# Patient Record
Sex: Female | Born: 1940
Health system: Southern US, Academic
[De-identification: ages and names within clinical notes are randomized; demographics above are authoritative.]

## PROBLEM LIST (undated history)

## (undated) ENCOUNTER — Ambulatory Visit

## (undated) ENCOUNTER — Encounter

## (undated) ENCOUNTER — Telehealth

## (undated) ENCOUNTER — Encounter: Attending: Internal Medicine | Primary: Internal Medicine

## (undated) ENCOUNTER — Telehealth: Attending: Internal Medicine | Primary: Internal Medicine

## (undated) ENCOUNTER — Encounter: Payer: MEDICARE | Attending: Internal Medicine | Primary: Internal Medicine

## (undated) ENCOUNTER — Non-Acute Institutional Stay: Payer: MEDICARE

## (undated) ENCOUNTER — Telehealth: Attending: Hematology & Oncology | Primary: Hematology & Oncology

## (undated) ENCOUNTER — Ambulatory Visit: Payer: MEDICARE | Attending: General Practice | Primary: General Practice

## (undated) ENCOUNTER — Ambulatory Visit: Attending: Urology | Primary: Urology

## (undated) ENCOUNTER — Inpatient Hospital Stay

## (undated) ENCOUNTER — Encounter: Attending: Oncology | Primary: Oncology

## (undated) DIAGNOSIS — R7989 Other specified abnormal findings of blood chemistry: Secondary | ICD-10-CM

## (undated) DIAGNOSIS — N3090 Cystitis, unspecified without hematuria: Secondary | ICD-10-CM

## (undated) DIAGNOSIS — M1990 Unspecified osteoarthritis, unspecified site: Secondary | ICD-10-CM

## (undated) DIAGNOSIS — G4730 Sleep apnea, unspecified: Secondary | ICD-10-CM

## (undated) DIAGNOSIS — I10 Essential (primary) hypertension: Secondary | ICD-10-CM

## (undated) DIAGNOSIS — J189 Pneumonia, unspecified organism: Secondary | ICD-10-CM

## (undated) DIAGNOSIS — F329 Major depressive disorder, single episode, unspecified: Secondary | ICD-10-CM

## (undated) DIAGNOSIS — F32A Depression, unspecified: Secondary | ICD-10-CM

## (undated) DIAGNOSIS — K1379 Other lesions of oral mucosa: Secondary | ICD-10-CM

## (undated) DIAGNOSIS — R222 Localized swelling, mass and lump, trunk: Secondary | ICD-10-CM

## (undated) DIAGNOSIS — R350 Frequency of micturition: Secondary | ICD-10-CM

## (undated) DIAGNOSIS — R0600 Dyspnea, unspecified: Secondary | ICD-10-CM

## (undated) DIAGNOSIS — R0609 Other forms of dyspnea: Secondary | ICD-10-CM

## (undated) DIAGNOSIS — R011 Cardiac murmur, unspecified: Secondary | ICD-10-CM

## (undated) DIAGNOSIS — E785 Hyperlipidemia, unspecified: Secondary | ICD-10-CM

## (undated) DIAGNOSIS — K219 Gastro-esophageal reflux disease without esophagitis: Secondary | ICD-10-CM

## (undated) DIAGNOSIS — N200 Calculus of kidney: Secondary | ICD-10-CM

## (undated) DIAGNOSIS — H9190 Unspecified hearing loss, unspecified ear: Secondary | ICD-10-CM

## (undated) DIAGNOSIS — D46Z Other myelodysplastic syndromes: Principal | ICD-10-CM

## (undated) DIAGNOSIS — R3915 Urgency of urination: Secondary | ICD-10-CM

## (undated) DIAGNOSIS — F419 Anxiety disorder, unspecified: Secondary | ICD-10-CM

## (undated) DIAGNOSIS — E119 Type 2 diabetes mellitus without complications: Secondary | ICD-10-CM

## (undated) DIAGNOSIS — E669 Obesity, unspecified: Secondary | ICD-10-CM

## (undated) DIAGNOSIS — R3129 Other microscopic hematuria: Secondary | ICD-10-CM

## (undated) DIAGNOSIS — I209 Angina pectoris, unspecified: Secondary | ICD-10-CM

## (undated) DIAGNOSIS — K1231 Oral mucositis (ulcerative) due to antineoplastic therapy: Secondary | ICD-10-CM

## (undated) DIAGNOSIS — N6019 Diffuse cystic mastopathy of unspecified breast: Secondary | ICD-10-CM

## (undated) DIAGNOSIS — Z9181 History of falling: Secondary | ICD-10-CM

## (undated) DIAGNOSIS — D696 Thrombocytopenia, unspecified: Secondary | ICD-10-CM

## (undated) HISTORY — DX: Calculus of kidney: N20.0

## (undated) HISTORY — DX: Dyspnea, unspecified: R06.00

## (undated) HISTORY — DX: Other myelodysplastic syndromes: D46.Z

## (undated) HISTORY — DX: Essential (primary) hypertension: I10

## (undated) HISTORY — DX: Obesity, unspecified: E66.9

## (undated) HISTORY — DX: Other lesions of oral mucosa: K13.79

## (undated) HISTORY — DX: Other specified abnormal findings of blood chemistry: R79.89

## (undated) HISTORY — DX: Frequency of micturition: R35.0

## (undated) HISTORY — DX: Morbid (severe) obesity due to excess calories: E66.01

## (undated) HISTORY — DX: Urgency of urination: R39.15

## (undated) HISTORY — DX: Anxiety disorder, unspecified: F41.9

## (undated) HISTORY — DX: Thrombocytopenia, unspecified: D69.6

## (undated) HISTORY — PX: APPENDECTOMY: SHX54

## (undated) HISTORY — PX: CHOLECYSTECTOMY: SHX55

## (undated) HISTORY — PX: DIAGNOSTIC LAPAROSCOPY: SUR761

## (undated) HISTORY — PX: OTHER SURGICAL HISTORY: SHX169

## (undated) HISTORY — DX: History of falling: Z91.81

## (undated) HISTORY — DX: Other forms of dyspnea: R06.09

## (undated) HISTORY — DX: Localized swelling, mass and lump, trunk: R22.2

## (undated) HISTORY — DX: Cardiac murmur, unspecified: R01.1

## (undated) HISTORY — DX: Diffuse cystic mastopathy of unspecified breast: N60.19

## (undated) HISTORY — DX: Unspecified hearing loss, unspecified ear: H91.90

## (undated) HISTORY — DX: Oral mucositis (ulcerative) due to antineoplastic therapy: K12.31

## (undated) HISTORY — DX: Other microscopic hematuria: R31.29

## (undated) HISTORY — DX: Cystitis, unspecified without hematuria: N30.90

## (undated) HISTORY — PX: CARDIAC CATHETERIZATION: SHX172

## (undated) HISTORY — PX: ABDOMINAL HYSTERECTOMY: SHX81

---
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
## 1998-06-13 ENCOUNTER — Encounter: Payer: Self-pay | Admitting: *Deleted

## 1998-06-13 ENCOUNTER — Ambulatory Visit (HOSPITAL_COMMUNITY): Admission: RE | Admit: 1998-06-13 | Discharge: 1998-06-13 | Payer: Self-pay | Admitting: *Deleted

## 2003-11-13 ENCOUNTER — Other Ambulatory Visit: Payer: Self-pay

## 2004-07-10 ENCOUNTER — Ambulatory Visit: Payer: Self-pay | Admitting: Surgery

## 2004-07-25 ENCOUNTER — Other Ambulatory Visit: Payer: Self-pay

## 2004-07-30 ENCOUNTER — Ambulatory Visit: Payer: Self-pay | Admitting: Surgery

## 2004-10-03 ENCOUNTER — Inpatient Hospital Stay: Payer: Self-pay | Admitting: Internal Medicine

## 2005-02-03 ENCOUNTER — Other Ambulatory Visit: Payer: Self-pay

## 2005-02-04 ENCOUNTER — Inpatient Hospital Stay: Payer: Self-pay | Admitting: Surgery

## 2005-02-15 ENCOUNTER — Ambulatory Visit: Payer: Self-pay | Admitting: Surgery

## 2005-02-20 ENCOUNTER — Ambulatory Visit: Payer: Self-pay | Admitting: Surgery

## 2005-04-23 ENCOUNTER — Emergency Department: Payer: Self-pay | Admitting: Emergency Medicine

## 2005-07-07 ENCOUNTER — Emergency Department: Payer: Self-pay | Admitting: Emergency Medicine

## 2005-07-07 ENCOUNTER — Other Ambulatory Visit: Payer: Self-pay

## 2005-12-07 ENCOUNTER — Observation Stay: Payer: Self-pay | Admitting: Internal Medicine

## 2005-12-12 ENCOUNTER — Other Ambulatory Visit: Payer: Self-pay

## 2005-12-16 ENCOUNTER — Ambulatory Visit: Payer: Self-pay | Admitting: Urology

## 2006-03-31 ENCOUNTER — Ambulatory Visit: Payer: Self-pay | Admitting: Gastroenterology

## 2006-05-16 ENCOUNTER — Other Ambulatory Visit: Payer: Self-pay

## 2006-05-16 ENCOUNTER — Ambulatory Visit: Payer: Self-pay | Admitting: Internal Medicine

## 2007-04-16 ENCOUNTER — Emergency Department: Payer: Self-pay | Admitting: Unknown Physician Specialty

## 2012-09-07 ENCOUNTER — Ambulatory Visit: Payer: Self-pay | Admitting: Internal Medicine

## 2012-09-14 ENCOUNTER — Ambulatory Visit: Payer: Self-pay | Admitting: Internal Medicine

## 2012-09-14 LAB — CBC WITH DIFFERENTIAL/PLATELET
Basophil #: 0 10*3/uL (ref 0.0–0.1)
Basophil %: 0.4 %
Eosinophil #: 0.2 10*3/uL (ref 0.0–0.7)
Eosinophil %: 2 %
HCT: 39.8 % (ref 35.0–47.0)
HGB: 13.6 g/dL (ref 12.0–16.0)
Lymphocyte #: 1.5 10*3/uL (ref 1.0–3.6)
Lymphocyte %: 18.2 %
MCH: 30.1 pg (ref 26.0–34.0)
MCHC: 34 g/dL (ref 32.0–36.0)
MCV: 89 fL (ref 80–100)
Monocyte #: 0.7 x10 3/mm (ref 0.2–0.9)
Monocyte %: 8.9 %
Neutrophil #: 5.6 10*3/uL (ref 1.4–6.5)
Neutrophil %: 70.5 %
Platelet: 212 10*3/uL (ref 150–440)
RBC: 4.5 10*6/uL (ref 3.80–5.20)
RDW: 13.9 % (ref 11.5–14.5)
WBC: 8 10*3/uL (ref 3.6–11.0)

## 2012-09-14 LAB — BASIC METABOLIC PANEL
Anion Gap: 6 — ABNORMAL LOW (ref 7–16)
BUN: 15 mg/dL (ref 7–18)
Calcium, Total: 9.1 mg/dL (ref 8.5–10.1)
Chloride: 104 mmol/L (ref 98–107)
Co2: 31 mmol/L (ref 21–32)
Creatinine: 0.89 mg/dL (ref 0.60–1.30)
EGFR (African American): >60
EGFR (Non-African Amer.): >60
Glucose: 103 mg/dL — ABNORMAL HIGH (ref 65–99)
Osmolality: 282 (ref 275–301)
Potassium: 3.6 mmol/L (ref 3.5–5.1)
Sodium: 141 mmol/L (ref 136–145)

## 2012-10-30 ENCOUNTER — Ambulatory Visit: Payer: Self-pay | Admitting: Family Medicine

## 2012-10-30 LAB — BASIC METABOLIC PANEL
Anion Gap: 4 — ABNORMAL LOW (ref 7–16)
BUN: 21 mg/dL — ABNORMAL HIGH (ref 7–18)
Calcium, Total: 9.3 mg/dL (ref 8.5–10.1)
Chloride: 107 mmol/L (ref 98–107)
Co2: 31 mmol/L (ref 21–32)
Creatinine: 0.8 mg/dL (ref 0.60–1.30)
EGFR (African American): >60
EGFR (Non-African Amer.): >60
Glucose: 105 mg/dL — ABNORMAL HIGH (ref 65–99)
Osmolality: 286 (ref 275–301)
Potassium: 3.6 mmol/L (ref 3.5–5.1)
Sodium: 142 mmol/L (ref 136–145)

## 2012-11-09 ENCOUNTER — Inpatient Hospital Stay: Payer: Self-pay | Admitting: Internal Medicine

## 2012-11-09 LAB — CK TOTAL AND CKMB (NOT AT ARMC)
CK, Total: 74 U/L (ref 21–215)
CK, Total: 63 U/L (ref 21–215)
CK-MB: 1.4 ng/mL (ref 0.5–3.6)
CK-MB: 1.7 ng/mL (ref 0.5–3.6)

## 2012-11-09 LAB — CBC WITH DIFFERENTIAL/PLATELET
Basophil #: 0 10*3/uL (ref 0.0–0.1)
Basophil %: 0.6 %
Eosinophil #: 0.2 10*3/uL (ref 0.0–0.7)
Eosinophil %: 1.8 %
HCT: 38.6 % (ref 35.0–47.0)
HGB: 13.2 g/dL (ref 12.0–16.0)
Lymphocyte #: 1.2 10*3/uL (ref 1.0–3.6)
Lymphocyte %: 13.5 %
MCH: 30 pg (ref 26.0–34.0)
MCHC: 34.2 g/dL (ref 32.0–36.0)
MCV: 88 fL (ref 80–100)
Monocyte #: 0.6 x10 3/mm (ref 0.2–0.9)
Monocyte %: 7.4 %
Neutrophil #: 6.6 10*3/uL — ABNORMAL HIGH (ref 1.4–6.5)
Neutrophil %: 76.7 %
Platelet: 160 10*3/uL (ref 150–440)
RBC: 4.4 10*6/uL (ref 3.80–5.20)
RDW: 13.7 % (ref 11.5–14.5)
WBC: 8.6 10*3/uL (ref 3.6–11.0)

## 2012-11-09 LAB — BASIC METABOLIC PANEL
Anion Gap: 6 — ABNORMAL LOW (ref 7–16)
BUN: 18 mg/dL (ref 7–18)
Calcium, Total: 8.5 mg/dL (ref 8.5–10.1)
Chloride: 109 mmol/L — ABNORMAL HIGH (ref 98–107)
Co2: 27 mmol/L (ref 21–32)
Creatinine: 0.94 mg/dL (ref 0.60–1.30)
EGFR (African American): >60
EGFR (Non-African Amer.): >60
Glucose: 100 mg/dL — ABNORMAL HIGH (ref 65–99)
Osmolality: 285 (ref 275–301)
Potassium: 3.6 mmol/L (ref 3.5–5.1)
Sodium: 142 mmol/L (ref 136–145)

## 2012-11-09 LAB — APTT: Activated PTT: 40.4 secs — ABNORMAL HIGH (ref 23.6–35.9)

## 2012-11-09 LAB — TROPONIN I
Troponin-I: 0.04 ng/mL
Troponin-I: 0.1 ng/mL — ABNORMAL HIGH

## 2012-11-10 LAB — CK TOTAL AND CKMB (NOT AT ARMC)
CK, Total: 60 U/L (ref 21–215)
CK, Total: 68 U/L (ref 21–215)

## 2012-11-10 LAB — CBC WITH DIFFERENTIAL/PLATELET
Basophil %: 0.7 %
Eosinophil #: 0.1 10*3/uL (ref 0.0–0.7)
HCT: 35.3 % (ref 35.0–47.0)
Lymphocyte %: 22.6 %
MCH: 30 pg (ref 26.0–34.0)
MCHC: 34.1 g/dL (ref 32.0–36.0)
MCV: 88 fL (ref 80–100)
Monocyte #: 0.7 x10 3/mm (ref 0.2–0.9)
Neutrophil #: 5.3 10*3/uL (ref 1.4–6.5)
Neutrophil %: 66.3 %
Platelet: 154 10*3/uL (ref 150–440)
RBC: 4.01 10*6/uL (ref 3.80–5.20)

## 2012-11-10 LAB — APTT
Activated PTT: >160 secs (ref 23.6–35.9)
Activated PTT: 158.1 secs — ABNORMAL HIGH (ref 23.6–35.9)

## 2012-11-10 LAB — BASIC METABOLIC PANEL
BUN: 14 mg/dL (ref 7–18)
Calcium, Total: 8.2 mg/dL — ABNORMAL LOW (ref 8.5–10.1)
Co2: 29 mmol/L (ref 21–32)
Creatinine: 0.79 mg/dL (ref 0.60–1.30)
EGFR (African American): >60
Glucose: 103 mg/dL — ABNORMAL HIGH (ref 65–99)
Osmolality: 286 (ref 275–301)
Potassium: 3.1 mmol/L — ABNORMAL LOW (ref 3.5–5.1)

## 2012-11-10 LAB — MAGNESIUM: Magnesium: 1.8 mg/dL

## 2012-11-10 LAB — LIPID PANEL: Cholesterol: 167 mg/dL (ref 0–200)

## 2012-11-10 LAB — TROPONIN I: Troponin-I: 0.03 ng/mL

## 2012-11-10 LAB — PROTIME-INR: Prothrombin Time: 13.8 secs (ref 11.5–14.7)

## 2013-05-04 ENCOUNTER — Ambulatory Visit: Payer: Self-pay | Admitting: Family Medicine

## 2014-05-02 DIAGNOSIS — J449 Chronic obstructive pulmonary disease, unspecified: Secondary | ICD-10-CM | POA: Insufficient documentation

## 2014-05-02 DIAGNOSIS — R069 Unspecified abnormalities of breathing: Secondary | ICD-10-CM | POA: Insufficient documentation

## 2014-05-02 DIAGNOSIS — R0681 Apnea, not elsewhere classified: Secondary | ICD-10-CM | POA: Insufficient documentation

## 2014-05-11 ENCOUNTER — Ambulatory Visit: Payer: Self-pay | Admitting: Family Medicine

## 2014-05-31 ENCOUNTER — Ambulatory Visit: Payer: Self-pay | Admitting: Specialist

## 2014-09-20 DIAGNOSIS — J449 Chronic obstructive pulmonary disease, unspecified: Secondary | ICD-10-CM | POA: Diagnosis not present

## 2014-09-20 DIAGNOSIS — R590 Localized enlarged lymph nodes: Secondary | ICD-10-CM | POA: Diagnosis not present

## 2014-09-20 DIAGNOSIS — R0602 Shortness of breath: Secondary | ICD-10-CM | POA: Diagnosis not present

## 2014-09-20 DIAGNOSIS — G4733 Obstructive sleep apnea (adult) (pediatric): Secondary | ICD-10-CM | POA: Diagnosis not present

## 2014-09-20 DIAGNOSIS — R1011 Right upper quadrant pain: Secondary | ICD-10-CM | POA: Diagnosis not present

## 2014-09-28 ENCOUNTER — Ambulatory Visit: Payer: Self-pay | Admitting: Family Medicine

## 2014-09-28 DIAGNOSIS — K760 Fatty (change of) liver, not elsewhere classified: Secondary | ICD-10-CM | POA: Diagnosis not present

## 2014-09-28 DIAGNOSIS — R109 Unspecified abdominal pain: Secondary | ICD-10-CM | POA: Diagnosis not present

## 2014-09-28 DIAGNOSIS — R1011 Right upper quadrant pain: Secondary | ICD-10-CM | POA: Diagnosis not present

## 2014-09-28 DIAGNOSIS — N289 Disorder of kidney and ureter, unspecified: Secondary | ICD-10-CM | POA: Diagnosis not present

## 2014-10-07 DIAGNOSIS — I10 Essential (primary) hypertension: Secondary | ICD-10-CM | POA: Diagnosis not present

## 2014-10-07 DIAGNOSIS — E785 Hyperlipidemia, unspecified: Secondary | ICD-10-CM | POA: Diagnosis not present

## 2014-10-07 DIAGNOSIS — E669 Obesity, unspecified: Secondary | ICD-10-CM | POA: Diagnosis not present

## 2014-10-07 DIAGNOSIS — R748 Abnormal levels of other serum enzymes: Secondary | ICD-10-CM | POA: Diagnosis not present

## 2014-10-07 DIAGNOSIS — E119 Type 2 diabetes mellitus without complications: Secondary | ICD-10-CM | POA: Diagnosis not present

## 2014-10-13 DIAGNOSIS — R748 Abnormal levels of other serum enzymes: Secondary | ICD-10-CM | POA: Diagnosis not present

## 2014-10-13 DIAGNOSIS — R350 Frequency of micturition: Secondary | ICD-10-CM | POA: Diagnosis not present

## 2014-10-13 DIAGNOSIS — R1011 Right upper quadrant pain: Secondary | ICD-10-CM | POA: Diagnosis not present

## 2014-10-17 DIAGNOSIS — R312 Other microscopic hematuria: Secondary | ICD-10-CM | POA: Diagnosis not present

## 2014-10-17 DIAGNOSIS — N390 Urinary tract infection, site not specified: Secondary | ICD-10-CM | POA: Diagnosis not present

## 2014-10-17 DIAGNOSIS — I10 Essential (primary) hypertension: Secondary | ICD-10-CM | POA: Diagnosis not present

## 2014-11-02 ENCOUNTER — Ambulatory Visit: Payer: Self-pay

## 2014-11-02 DIAGNOSIS — K5900 Constipation, unspecified: Secondary | ICD-10-CM | POA: Diagnosis not present

## 2014-11-02 DIAGNOSIS — N281 Cyst of kidney, acquired: Secondary | ICD-10-CM | POA: Diagnosis not present

## 2014-11-02 DIAGNOSIS — M5136 Other intervertebral disc degeneration, lumbar region: Secondary | ICD-10-CM | POA: Diagnosis not present

## 2014-11-02 DIAGNOSIS — L723 Sebaceous cyst: Secondary | ICD-10-CM | POA: Diagnosis not present

## 2014-11-02 DIAGNOSIS — N200 Calculus of kidney: Secondary | ICD-10-CM | POA: Diagnosis not present

## 2014-11-07 DIAGNOSIS — R312 Other microscopic hematuria: Secondary | ICD-10-CM | POA: Diagnosis not present

## 2014-11-17 DIAGNOSIS — K5900 Constipation, unspecified: Secondary | ICD-10-CM | POA: Diagnosis not present

## 2014-11-24 DIAGNOSIS — K5900 Constipation, unspecified: Secondary | ICD-10-CM | POA: Diagnosis not present

## 2014-12-28 DIAGNOSIS — R05 Cough: Secondary | ICD-10-CM | POA: Diagnosis not present

## 2014-12-28 DIAGNOSIS — K5900 Constipation, unspecified: Secondary | ICD-10-CM | POA: Diagnosis not present

## 2014-12-30 NOTE — Discharge Summary (Signed)
PATIENT NAME:  Teresa Carrillo, Teresa Carrillo MR#:  [id number] DATE OF BIRTH:  1941-02-27  DATE OF ADMISSION:  11/09/2012 DATE OF DISCHARGE:  11/10/2012  REFERRING PHYSICIAN: Keith Rake, MD   CONSULTATION:  Derrick Ravel, MD  DISCHARGE DIAGNOSES: 1.  Chest pain,  atypical without acute coronary syndrome.    2.  Hypertension.  3.  Hyperlipidemia.  4.  Obstructive sleep apnea.  5.  Obesity.   PROCEDURES: Stress test is normal.   CONDITION: Stable.   CODE STATUS: Full code.   HOME MEDICATIONS: 1.  Bupropion 300 mg p.o. daily.  2.  Zocor 20 mg p.o. at bedtime.  3.  Bisoprolol/HCTZ 5 mg/ 6.25 mg p.o. 1 tablet p.o. daily.  4.  Sertraline 100 mg p.o. tablets once a day.  5.  Klonopin 0.5 mg p.o. tablets b.i.d.  6.  Aspirin.   NEW MEDICATIONS:  1.  Aspirin 81 mg p.o. daily. 2.  Nitroglycerin 0.4 mg sublingual tablets  sublingual every five minutes up to 3 times p.r.n. for chest pain.  3.  Lisinopril 5 mg p.o. daily.   DIET: Low sodium, low fat, low cholesterol diet.   ACTIVITY: As tolerated.   FOLLOW-UP CARE: PCP within 1 to 2 weeks. Follow up with Dr. Clayborn Bigness within 1 week.   REASON FOR ADMISSION: Chest pain.   HOSPITAL COURSE: The patient is a 74 year old Caucasian female with a history of hypertension, hyperlipidemia, obesity, OSA, presented to the ED with chest pain, which was a pressure-like on the left side radiating to the left shoulder and neck, associated with racing of the heart and shortness of breath.  For detailed history and physical examination, please refer to the admission note dictated by Dr. Margaretmary Eddy. On admission date, the patient's troponin level was 0.04 and then increased to 0.10. CK 63, CK-MB 1.7, hemoglobin 13.2, D-dimer was elevated at 0.89 and so the patient got a CAT scan of the chest with contrast, which showed no evidence of PE, but the patient was admitted to CCU for  non-STEMI. After admission, the patient has been treated with heparin drip, aspirin, statin and  Lopressor. Troponin level decreased to 0.08 this morning and down to 0.03 this afternoon. Dr. Nehemiah Massed evaluated the patient and suggested stress test, which was normal. According to Dr. Nehemiah Massed, the patient has no ACS and may be discharged to home today The patient is clinically stable and will be discharged to home today. I discussed the patient's discharge plan with the patient and the case manager.   TIME SPENT: About 38 minutes     ____________________________ Demetrios Loll, MD qc:cc D: 11/10/2012 16:44:32 ET T: 11/10/2012 19:05:59 ET JOB#: [id number]  cc: Demetrios Loll, MD, <Dictator> Demetrios Loll MD ELECTRONICALLY SIGNED 11/11/2012 14:45

## 2014-12-30 NOTE — H&P (Signed)
PATIENT NAME:  Teresa Carrillo, Teresa Carrillo MR#:  [id number] DATE OF BIRTH:  Apr 03, 1941  DATE OF ADMISSION:  11/09/2012  PRIMARY CARE PHYSICIAN:  Dr. Keith Rake.   CARDIOLOGY:  Dr. Clayborn Bigness.   CHIEF COMPLAINT:  Chest pressure.   HISTORY OF PRESENT ILLNESS: The patient is a 74 year old obese female with a past medical history of hypertension, hyperlipidemia, and obstructive sleep apnea presenting to the ER with a chief complaint of chest pressure since this afternoon. The patient was in her usual state of health until this afternoon. Today the patient suddenly started having left-sided chest pressure radiating to the left shoulder and neck, associated with racing of her heart and shortness of breath.   Denies any nausea or vomiting. As the chest pressure was not going away, the patient called  911 and came into the ER immediately. A 12-lead EKG did not reveal any ST-T changes. The second troponin was positive. The initial one was at 0.04. The other troponin was 0.10.   The patient has received aspirin and sublingual nitroglycerin. A heparin bolus was given and a heparin drip was also ordered. During my examination the patient is reporting that after giving a heparin bolus, her chest pressure is significantly improved. She also reported that sublingual nitroglycerin helped with her chest pressure. The patient is again complaining of chest pressure. Nitro paste was ordered stat. A call was placed to Dr. Clayborn Bigness, her cardiologist, and I am awaiting a call-back from Dr. Clayborn Bigness.   The patient is reporting that she was recently seen by Dr. Clayborn Bigness for chest pain, and she had an abnormal stress test. Eventually she had a clean cardiac cath on January 6th. The patient is concerned that now she is having chest pressure and a heart attack.   Denies any heart attacks in the past. No dizziness or loss of consciousness. Denies any nausea or vomiting. The patient's heart fluttering sensation was resolved during my  examination.    PAST MEDICAL HISTORY:  Hypertension, hyperlipidemia, obstructive sleep apnea, obesity.   PSYCHIATRIC HISTORY:  Panic attacks and depression.   PAST SURGICAL HISTORY:  Appendectomy, hysterectomy, cholecystectomy, cardiac catheter on January 6th, which was clean.   ALLERGIES:  She has no known drug allergies.   HOME MEDICATIONS:  Simvastatin 20 mg once daily, sertraline 100 mg once daily, Klonopin 0.5 mg 2 times a day, bupropion 300 mg q. 24 hours, bisoprolol dose unknown 1 tablet once a day.   PSYCHOSOCIAL HISTORY:  Lives at home with husband. She smoked several years ago but quit smoking a long ago. Denies any alcohol or illicit drug usage.   FAMILY HISTORY:  Mother had history of diabetes mellitus and coronary artery disease. The patient does not know her dad's medical history.   REVIEW OF SYSTEMS:  CONSTITUTIONAL:  Denies any fever, fatigue. Complaining of chest pressure, but no weight loss or weight gain.  EYES:  Denies any blurry vision, glaucoma, cataracts.  EARS, NOSE, THROAT:  No epistaxis, discharge, postnasal drip.  RESPIRATIONS:  Denies cough. Complaining of shortness of breath but no syncope. Palpitations when she came in but that is no longer.  GASTROINTESTINAL:  No nausea, vomiting, diarrhea, or abdominal pain.  GENITOURINARY:  No dysuria, hematuria.  GYNECOLOGIC/BREASTS:  No breast masses or vaginal discharge.  ENDOCRINE:  No polyuria, nocturia, thyroid problems.  HEMATOLOGIC AND LYMPHATIC:  No anemia, easy bruising.  INTEGUMENTARY:  No acne, rash, lesions.  MUSCULOSKELETAL:  Denies any back pain but complaining of left shoulder pain and neck pain in  the left side.  Denies any gout.  NEUROLOGIC:  No vertigo, ataxia, dementia.  PSYCHIATRIC:  Has history of panic attacks and depression. Denies any ADD, OCD.   PHYSICAL EXAMINATION: VITAL SIGNS:  Temperature 98.1, pulse 61, respirations 20, blood pressure 123/59, satting  97% on 2 liters.  GENERAL  APPEARANCE:  The patient is obese, not in acute distress, moderately-built and well-nourished.  HEENT:  Normocephalic, atraumatic. Pupils are equally reacting to light and accommodation. No scleral icterus. Extraocular movements are intact. Moist mucous membranes. No sinus tenderness. Tympanic membranes are intact.  NECK:  Supple. No JVD. No thyromegaly. Range of motion is intact.  LUNGS:  Clear to auscultation bilaterally. No anterior chest wall tenderness on palpation. No accessory muscle usage.  CARDIOVASCULAR:  S1, S2 normal. Regular rate and rhythm. No murmurs. No peripheral edema.  GASTROINTESTINAL:  Soft, obese. Bowel sounds are positive in all 4 quadrants. Nontender, nondistended. No masses felt. No hepatosplenomegaly.  NEUROLOGIC:  Awake, alert, and oriented x 3. Motor and sensory are grossly intact. Reflexes are 2+.  EXTREMITIES:  No edema. No cyanosis. No clubbing.  SKIN:  No rashes. No lesions, rash, acne.  BACK:  No CVA tenderness.  PSYCHIATRIC:  Normal mood and affect.  MUSCULOSKELETAL: No joint effusion, tenderness, or erythema, and range of motion is grossly intact.   Twelve-lead EKG has revealed normal sinus rhythm with fusion complexes and premature atrial complexes. No ST elevations noted.   CARDIAC ENZYMES:  First set:  CK 74, troponin T 0.04, CPK-MB 1.4. A repeat CK is 63, troponin is 0.10, CPK-MB 1.7.   WBC 8.6, hemoglobin 13.2, hematocrit is 38.6, platelet count 160, PTT 40.4. D-dimer is elevated at 0.89.   Glucose 100, BUN 18, creatinine 0.94, sodium 142, potassium 3.6, chloride of 109, CO2 of 27, anion gap 6, calcium 8.5. Serum osmolality 285.   CT of the chest has revealed no CT evidence of pulmonary arterial embolic disease, interstitial infiltrate. Different consideration of pulmonary edema versus an infectious or inflammatory infiltrate, likely reactive, mediastinal lymph nodes;   ASSESSMENT AND PLAN: A 74 year old female coming in with chest pressure since this  afternoon, and seen by  Dr. Clayborn Bigness recently and had a clean catheterization on January 6th of this year, now with elevated troponin. Will be admitted with the following assessment and plan:   1.  Non-ST elevation myocardial infarction: We will put her on acute coronary syndrome protocol with oxygen, nitroglycerin, aspirin, beta-blocker, statin, and heparin drip. Consult was placed to Dr. Clayborn Bigness. Awaiting call-back. Call placed 2 times. Will cycle cardiac biomarkers.  2.  The patient will be admitted to the critical care unit and monitored continuously.  3.  Hypertension:  Stable. Will provide beta blocker with metoprolol.  4.  Hyperlipidemia:  Check fasting lipid panel in a.m., and the patient will be given simvastatin.  5.  Obstructive sleep apnea:  Stable. We will provide her CPAP at bedtime.  6.  CT of chest has revealed infectious or inflammatory infiltrate. The patient is asymptomatic. White count is normal. Denies any cough. I will hold off on antibiotics at this time, and rounding physicians to continue close monitoring of the patient.  7.  Will provide her GI prophylaxis with Protonix.  8.  Deep vein thrombosis prophylaxis. The patient is on a heparin drip.  9.  CODE STATUS:  FULL CODE.   The diagnosis and plan of care was discussed in detail with the patient. She is aware of the plan.   Total critical  care time spent: 60 minutes.    ____________________________ Nicholes Mango, MD ag:dm D: 11/10/2012 00:36:00 ET T: 11/10/2012 07:40:49 ET JOB#: [id number]  cc: Nicholes Mango, MD, <Dictator> [facility] Myrla Halsted, MD Dwayne D. Clayborn Bigness, MD      Nicholes Mango MD ELECTRONICALLY SIGNED 11/13/2012 1:55

## 2014-12-30 NOTE — Consult Note (Signed)
PATIENT NAME:  Teresa Carrillo, AMARAL MR#:  [id number] DATE OF BIRTH:  12/14/1940  DATE OF CONSULTATION:  11/10/2012  PRIMARY CARE PHYSICIAN: Dr. Clayborn Bigness  CONSULTING PHYSICIAN:  Dr. Margaretmary Eddy  REASON FOR CONSULTATION: Chest pain with hypertension and elevated troponin.   CHIEF COMPLAINT: "I have chest pain and palpitations."   HISTORY OF PRESENT ILLNESS: This is a 74 year old female with known cardiac catheterization last year for chest pain with normal coronary arteries and normal LV systolic function, who had a stress test at that time as well as showing no rhythm disturbances. The patient had hypertension for which she has been on appropriate medication management and has done fairly well. Recently, she has had some palpitations, weakness, fatigue and chest discomfort, substernal, radiating into her back and left arm, without physical activity, associated with some shortness of breath. The patient was seen in the Emergency Room with an EKG showing normal sinus rhythm, normal EKG. Troponin was 0.1, and has been otherwise normal. The patient has had no evidence of worsening chest pain other than a dull ache into the center of her chest. CT scan also showed no evidence of significant changes.   REVIEW OF SYSTEMS:  The patient's review of systems is negative for vision change, ringing in the ears, hearing loss, cough, congestion, heartburn, nausea, vomiting, diarrhea, bloody stools, stomach pain, extremity pain, leg weakness, cramping of the buttocks. No blood clots, headaches, blackouts, dizzy spells, nosebleeds, congestion, trouble swallowing, frequent urination, urination at night, muscle weakness, numbness, anxiety, depression, skin lesions or skin rashes.   PAST MEDICAL HISTORY: Hypertension.   FAMILY HISTORY: Mother had hypertension, coronary artery disease; this was at an early age.   SOCIAL HISTORY: Currently denies alcohol or tobacco use.   ALLERGIES AND MEDICATIONS:  As listed.  PHYSICAL  EXAMINATION: VITAL SIGNS: Blood pressure is 110/68 bilaterally, heart rate 72 upright, reclining and regular.  GENERAL: She is a well-appearing female in no acute distress.  HEENT: No icterus, thyromegaly, ulcers, hemorrhage or xanthelasma.  CARDIOVASCULAR: Regular rate and rhythm. Normal S1 and S2, without murmur, gallop or rub. PMI is normal size and placement. Carotid upstroke is normal without bruit. Jugular venous pressure is normal.  LUNGS: Have a few basilar crackles with normal respirations.  ABDOMEN: Soft, nontender, without hepatosplenomegaly or masses. Abdominal aorta is normal size, without bruit. EXTREMITIES: Show 2+ bilateral pulses in dorsal, pedal, radial and femoral arteries, without lower extremity edema, cyanosis, clubbing or ulcers.  NEUROLOGIC: Oriented to time, place and person, with normal mood and affect.   ASSESSMENT: A 74 year old female with hypertension, normal coronaries by cardiac catheterization, with minimally elevated troponin consistent with demand ischemia and no current evidence of acute coronary syndrome, needing further medical management and treatment options.   RECOMMENDATIONS: 1.  Continue serial ECG and enzymes to assess for possible myocardial infarction.  2.  Ambulate and perform stress EKG for further evaluation of rhythm disturbances, chest pain and/or myocardial ischemia.  3.  Hypertension control with current medical regimens.  4.  Continue heparin until further stress test evaluation.  5.  Further investigation of chest discomfort after above.     ____________________________ Corey Skains, MD bjk:dm D: 11/10/2012 09:59:00 ET T: 11/10/2012 10:12:48 ET JOB#: [id number]  cc: Corey Skains, MD, <Dictator> Corey Skains MD ELECTRONICALLY SIGNED 11/11/2012 8:56

## 2014-12-30 NOTE — H&P (Signed)
PATIENT NAME:  Teresa Carrillo, Teresa Carrillo MR#:  [id number] DATE OF BIRTH:  08-20-1941  DATE OF ADMISSION:  11/10/2012  PRIMARY CARE PHYSICIAN:  Dr. Keith Rake.   CARDIOLOGY:  Dr. Clayborn Bigness.   CHIEF COMPLAINT:  Chest pressure.   HISTORY OF PRESENT ILLNESS: The patient is a 74 year old obese female with a past medical history of hypertension, hyperlipidemia, and obstructive sleep apnea presenting to the ER with a chief complaint of chest pressure since this afternoon. The patient was in her usual state of health until this afternoon. Today the patient suddenly started having left-sided chest pressure radiating to the left shoulder and neck, associated with racing of her heart and shortness of breath.   Denies any nausea or vomiting. As the chest pressure was not going away, the patient called  911 and came into the ER immediately. A 12-lead EKG did not reveal any ST-T changes. The second troponin was positive. The initial one was at 0.04. The other troponin was 0.10.   The patient has received aspirin and sublingual nitroglycerin. A heparin bolus was given and a heparin drip was also ordered. During my examination the patient is reporting that after giving a heparin bolus, her chest pressure is significantly improved. She also reported that sublingual nitroglycerin helped with her chest pressure. The patient is again complaining of chest pressure. Nitro paste was ordered stat. A call was placed to Dr. Clayborn Bigness, her cardiologist, and I am awaiting a call-back from Dr. Clayborn Bigness.   The patient is reporting that she was recently seen by Dr. Clayborn Bigness for chest pain, and she had an abnormal stress test. Eventually she had a clean cardiac cath on January 6th. The patient is concerned that now she is having chest pressure and a heart attack.   Denies any heart attacks in the past. No dizziness or loss of consciousness. Denies any nausea or vomiting. The patient's heart fluttering sensation was resolved during my  examination.    PAST MEDICAL HISTORY:  Hypertension, hyperlipidemia, obstructive sleep apnea, obesity.   PSYCHIATRIC HISTORY:  Panic attacks and depression.   PAST SURGICAL HISTORY:  Appendectomy, hysterectomy, cholecystectomy, cardiac catheter on January 6th, which was clean.   ALLERGIES:  She has no known drug allergies.   HOME MEDICATIONS:  Simvastatin 20 mg once daily, sertraline 100 mg once daily, Klonopin 0.5 mg 2 times a day, bupropion 300 mg q. 24 hours, bisoprolol dose unknown 1 tablet once a day.   PSYCHOSOCIAL HISTORY:  Lives at home with husband. She smoked several years ago but quit smoking a long (Dictation Anomaly) <<MISSING TEXT>> ago. Denies any alcohol or illicit drug usage.   FAMILY HISTORY:  Mother had history of diabetes mellitus and coronary artery disease. The patient does not know her dads medical history.   REVIEW OF SYSTEMS:  CONSTITUTIONAL:  Denies any fever, fatigue. Complaining of chest pressure, but no weight loss or weight gain.  EYES:  Denies any blurry vision, glaucoma, cataracts.  EARS, NOSE, THROAT:  No epistaxis, discharge, postnasal drip.  RESPIRATIONS:  Denies cough. Complaining of shortness of breath but no syncope. Palpitations when she came in but that is no longer.  GASTROINTESTINAL:  No nausea, vomiting, diarrhea, or abdominal pain.  GENITOURINARY:  No dysuria, hematuria.  GYNECOLOGIC/BREASTS:  No breast masses or vaginal discharge.  ENDOCRINE:  No polyuria, nocturia, thyroid problems.  HEMATOLOGIC AND LYMPHATIC:  No anemia, easy bruising.  INTEGUMENTARY:  No acne, rash, lesions.  MUSCULOSKELETAL:  Denies any back pain but complaining of left shoulder pain  and neck pain in the left side.  Denies any gout.  NEUROLOGIC:  No vertigo, ataxia, dementia.  PSYCHIATRIC:  Has history of panic attacks and depression. Denies any ADD, OCD.   PHYSICAL EXAMINATION: VITAL SIGNS:  Temperature 98.1, pulse 61, respirations 20, blood pressure 123/59, satting   97% on 2 liters.  GENERAL APPEARANCE:  The patient is obese, not in acute distress, moderately-built and well-nourished.  HEENT:  Normocephalic, atraumatic. Pupils are equally reacting to light and accommodation. No scleral icterus. Extraocular movements are intact. Moist mucous membranes. No sinus tenderness. Tympanic membranes are intact.  NECK:  Supple. No JVD. No thyromegaly. Range of motion is intact.  LUNGS:  Clear to auscultation bilaterally. No anterior chest wall tenderness on palpation. No accessory muscle usage.  CARDIOVASCULAR:  S1, S2 normal. Regular rate and rhythm. No murmurs. No peripheral edema.  GASTROINTESTINAL:  Soft, obese. Bowel sounds are positive in all 4 quadrants. Nontender, nondistended. No masses felt. No hepatosplenomegaly.  NEUROLOGIC:  Awake, alert, and oriented x 3. Motor and sensory are grossly intact. Reflexes are 2+.  EXTREMITIES:  No edema. No cyanosis. No clubbing.  SKIN:  No rashes. No lesions, rash, acne.  BACK:  No CVA tenderness.  PSYCHIATRIC:  Normal mood and affect.  MUSCULOSKELETAL: No joint effusion, tenderness, or erythema, and range of motion is grossly intact.   Twelve-lead EKG has revealed normal sinus rhythm with fusion complexes and premature atrial complexes. No ST elevations noted.   CARDIAC ENZYMES:  First set:  CK 74, troponin T 0.04, CPK-MB 1.4. A repeat CK is 63, troponin is 0.10, CPK-MB 1.7.   WBC 8.6, hemoglobin 13.2, hematocrit is 38.6, platelet count 160, PTT 40.4. D-dimer is elevated at 0.89.   Glucose 100, BUN 18, creatinine 0.94, sodium 142, potassium 3.6, chloride of 109, CO2 of 27, anion gap 6, calcium 8.5. Serum osmolality 285.   CT of the chest has revealed no CT evidence of pulmonary arterial embolic disease, interstitial infiltrate. Different consideration of pulmonary edema versus an infectious or inflammatory infiltrate, likely reactive, mediastinal lymph nodes; (Dictation Anomaly) <<MISSING TEXT>> findings as described  above.   ASSESSMENT AND PLAN: A 74 year old female coming in with chest pressure since this afternoon, and seen by  Dr. Clayborn Bigness recently and had a clean catheterization on January 6th of this year, now with elevated troponin. Will be admitted with the following assessment and plan:   1.  Non-ST elevation myocardial infarction: We will put her on acute coronary syndrome protocol with oxygen, nitroglycerin, aspirin, beta-blocker, statin, and heparin drip. Consult was placed to Dr. Clayborn Bigness. Awaiting call-back. Call placed 2 times. Will cycle cardiac biomarkers.  2.  The patient will be admitted to the critical care unit and monitored continuously.  3.  Hypertension:  Stable. Will provide beta blocker with metoprolol.  4.  Hyperlipidemia:  Check fasting lipid panel in a.m., and the patient will be given simvastatin.  5.  Obstructive sleep apnea:  Stable. We will provide her CPAP at bedtime.  6.  CT of chest has revealed infectious or inflammatory infiltrate. The patient is asymptomatic. White count is normal. Denies any cough. I will hold off on antibiotics at this time, and rounding physicians to continue close monitoring of the patient.  7.  Will provide her GI prophylaxis with Protonix.  8.  Deep vein thrombosis prophylaxis. The patient is on a heparin drip.  9.  CODE STATUS:  FULL CODE.   The diagnosis and plan of care was discussed in detail with  the patient. She is aware of the plan.   Total critical care time spent: 60 minutes.    ____________________________ Nicholes Mango, MD ag:dm D: 11/10/2012 00:36:00 ET T: 11/10/2012 07:40:49 ET JOB#: [id number]  cc: Dwayne D. Clayborn Bigness, MD * Keith Rake, MD Nicholes Mango, MD, <Dictator>

## 2015-01-02 DIAGNOSIS — J310 Chronic rhinitis: Secondary | ICD-10-CM | POA: Diagnosis not present

## 2015-01-02 DIAGNOSIS — G4733 Obstructive sleep apnea (adult) (pediatric): Secondary | ICD-10-CM | POA: Diagnosis not present

## 2015-01-02 DIAGNOSIS — R05 Cough: Secondary | ICD-10-CM | POA: Diagnosis not present

## 2015-01-02 DIAGNOSIS — R0602 Shortness of breath: Secondary | ICD-10-CM | POA: Diagnosis not present

## 2015-01-09 DIAGNOSIS — E119 Type 2 diabetes mellitus without complications: Secondary | ICD-10-CM | POA: Diagnosis not present

## 2015-01-09 DIAGNOSIS — H4011X1 Primary open-angle glaucoma, mild stage: Secondary | ICD-10-CM | POA: Diagnosis not present

## 2015-01-09 DIAGNOSIS — H2510 Age-related nuclear cataract, unspecified eye: Secondary | ICD-10-CM | POA: Diagnosis not present

## 2015-01-16 ENCOUNTER — Other Ambulatory Visit: Payer: Self-pay | Admitting: Specialist

## 2015-01-16 DIAGNOSIS — J449 Chronic obstructive pulmonary disease, unspecified: Secondary | ICD-10-CM | POA: Diagnosis not present

## 2015-01-16 DIAGNOSIS — R918 Other nonspecific abnormal finding of lung field: Secondary | ICD-10-CM | POA: Diagnosis not present

## 2015-01-16 DIAGNOSIS — R590 Localized enlarged lymph nodes: Secondary | ICD-10-CM | POA: Diagnosis not present

## 2015-01-16 DIAGNOSIS — R0602 Shortness of breath: Secondary | ICD-10-CM | POA: Diagnosis not present

## 2015-01-23 ENCOUNTER — Ambulatory Visit
Admission: RE | Admit: 2015-01-23 | Discharge: 2015-01-23 | Disposition: A | Discharge status: Home / Self Care | Payer: Medicare Other | Source: Ambulatory Visit | Attending: Specialist | Admitting: Specialist

## 2015-01-23 DIAGNOSIS — R590 Localized enlarged lymph nodes: Secondary | ICD-10-CM | POA: Diagnosis present

## 2015-01-23 DIAGNOSIS — R911 Solitary pulmonary nodule: Secondary | ICD-10-CM | POA: Insufficient documentation

## 2015-01-23 DIAGNOSIS — I10 Essential (primary) hypertension: Secondary | ICD-10-CM | POA: Diagnosis not present

## 2015-01-23 DIAGNOSIS — K219 Gastro-esophageal reflux disease without esophagitis: Secondary | ICD-10-CM | POA: Diagnosis not present

## 2015-01-23 DIAGNOSIS — R918 Other nonspecific abnormal finding of lung field: Secondary | ICD-10-CM | POA: Diagnosis present

## 2015-01-23 DIAGNOSIS — J9809 Other diseases of bronchus, not elsewhere classified: Secondary | ICD-10-CM | POA: Diagnosis not present

## 2015-01-23 DIAGNOSIS — E119 Type 2 diabetes mellitus without complications: Secondary | ICD-10-CM | POA: Diagnosis not present

## 2015-01-23 DIAGNOSIS — I2510 Atherosclerotic heart disease of native coronary artery without angina pectoris: Secondary | ICD-10-CM | POA: Diagnosis not present

## 2015-01-23 DIAGNOSIS — K5900 Constipation, unspecified: Secondary | ICD-10-CM | POA: Diagnosis not present

## 2015-01-23 DIAGNOSIS — E785 Hyperlipidemia, unspecified: Secondary | ICD-10-CM | POA: Diagnosis not present

## 2015-01-23 HISTORY — DX: Type 2 diabetes mellitus without complications: E11.9

## 2015-01-23 MED ORDER — IOHEXOL 300 MG/ML  SOLN
75.0000 mL | Freq: Once | INTRAMUSCULAR | Status: AC | PRN
  Administered 2015-01-23: 75 mL via INTRAVENOUS

## 2015-02-02 ENCOUNTER — Other Ambulatory Visit: Payer: Self-pay | Admitting: *Deleted

## 2015-02-02 ENCOUNTER — Encounter: Payer: Self-pay | Admitting: *Deleted

## 2015-02-02 DIAGNOSIS — I2510 Atherosclerotic heart disease of native coronary artery without angina pectoris: Secondary | ICD-10-CM | POA: Insufficient documentation

## 2015-02-02 DIAGNOSIS — E119 Type 2 diabetes mellitus without complications: Secondary | ICD-10-CM | POA: Insufficient documentation

## 2015-02-02 DIAGNOSIS — Z8679 Personal history of other diseases of the circulatory system: Secondary | ICD-10-CM | POA: Insufficient documentation

## 2015-02-02 DIAGNOSIS — Z8639 Personal history of other endocrine, nutritional and metabolic disease: Secondary | ICD-10-CM | POA: Insufficient documentation

## 2015-02-08 DIAGNOSIS — G4733 Obstructive sleep apnea (adult) (pediatric): Secondary | ICD-10-CM | POA: Diagnosis not present

## 2015-02-08 DIAGNOSIS — K228 Other specified diseases of esophagus: Secondary | ICD-10-CM | POA: Diagnosis not present

## 2015-02-08 DIAGNOSIS — R0602 Shortness of breath: Secondary | ICD-10-CM | POA: Diagnosis not present

## 2015-02-08 DIAGNOSIS — J449 Chronic obstructive pulmonary disease, unspecified: Secondary | ICD-10-CM | POA: Diagnosis not present

## 2015-02-13 ENCOUNTER — Telehealth: Payer: Self-pay | Admitting: Urgent Care

## 2015-02-13 NOTE — Telephone Encounter (Signed)
Pt called and cancelled appt

## 2015-02-23 ENCOUNTER — Encounter: Payer: Self-pay | Admitting: *Deleted

## 2015-02-24 ENCOUNTER — Ambulatory Visit
Admission: RE | Admit: 2015-02-24 | Discharge: 2015-02-24 | Disposition: A | Discharge status: Home / Self Care | Payer: Medicare Other | Source: Ambulatory Visit | Attending: Unknown Physician Specialty | Admitting: Unknown Physician Specialty

## 2015-02-24 ENCOUNTER — Ambulatory Visit: Payer: Medicare Other | Admitting: *Deleted

## 2015-02-24 ENCOUNTER — Encounter
Admission: RE | Disposition: A | Discharge status: Home / Self Care | Payer: Self-pay | Source: Ambulatory Visit | Attending: Unknown Physician Specialty

## 2015-02-24 ENCOUNTER — Encounter: Payer: Self-pay | Admitting: *Deleted

## 2015-02-24 DIAGNOSIS — I10 Essential (primary) hypertension: Secondary | ICD-10-CM | POA: Diagnosis not present

## 2015-02-24 DIAGNOSIS — K644 Residual hemorrhoidal skin tags: Secondary | ICD-10-CM | POA: Insufficient documentation

## 2015-02-24 DIAGNOSIS — Z87891 Personal history of nicotine dependence: Secondary | ICD-10-CM | POA: Diagnosis not present

## 2015-02-24 DIAGNOSIS — K640 First degree hemorrhoids: Secondary | ICD-10-CM | POA: Diagnosis not present

## 2015-02-24 DIAGNOSIS — J449 Chronic obstructive pulmonary disease, unspecified: Secondary | ICD-10-CM | POA: Insufficient documentation

## 2015-02-24 DIAGNOSIS — E669 Obesity, unspecified: Secondary | ICD-10-CM | POA: Insufficient documentation

## 2015-02-24 DIAGNOSIS — K449 Diaphragmatic hernia without obstruction or gangrene: Secondary | ICD-10-CM | POA: Insufficient documentation

## 2015-02-24 DIAGNOSIS — E785 Hyperlipidemia, unspecified: Secondary | ICD-10-CM | POA: Insufficient documentation

## 2015-02-24 DIAGNOSIS — K2930 Chronic superficial gastritis without bleeding: Secondary | ICD-10-CM | POA: Diagnosis not present

## 2015-02-24 DIAGNOSIS — G4730 Sleep apnea, unspecified: Secondary | ICD-10-CM | POA: Diagnosis not present

## 2015-02-24 DIAGNOSIS — M1990 Unspecified osteoarthritis, unspecified site: Secondary | ICD-10-CM | POA: Insufficient documentation

## 2015-02-24 DIAGNOSIS — K219 Gastro-esophageal reflux disease without esophagitis: Secondary | ICD-10-CM | POA: Insufficient documentation

## 2015-02-24 DIAGNOSIS — E119 Type 2 diabetes mellitus without complications: Secondary | ICD-10-CM | POA: Insufficient documentation

## 2015-02-24 DIAGNOSIS — Z79899 Other long term (current) drug therapy: Secondary | ICD-10-CM | POA: Diagnosis not present

## 2015-02-24 DIAGNOSIS — F329 Major depressive disorder, single episode, unspecified: Secondary | ICD-10-CM | POA: Diagnosis not present

## 2015-02-24 DIAGNOSIS — K259 Gastric ulcer, unspecified as acute or chronic, without hemorrhage or perforation: Secondary | ICD-10-CM | POA: Diagnosis not present

## 2015-02-24 DIAGNOSIS — Z6841 Body Mass Index (BMI) 40.0 and over, adult: Secondary | ICD-10-CM | POA: Diagnosis not present

## 2015-02-24 DIAGNOSIS — R194 Change in bowel habit: Secondary | ICD-10-CM | POA: Diagnosis present

## 2015-02-24 HISTORY — DX: Major depressive disorder, single episode, unspecified: F32.9

## 2015-02-24 HISTORY — PX: COLONOSCOPY: SHX5424

## 2015-02-24 HISTORY — DX: Unspecified osteoarthritis, unspecified site: M19.90

## 2015-02-24 HISTORY — DX: Gastro-esophageal reflux disease without esophagitis: K21.9

## 2015-02-24 HISTORY — DX: Sleep apnea, unspecified: G47.30

## 2015-02-24 HISTORY — PX: ESOPHAGOGASTRODUODENOSCOPY: SHX5428

## 2015-02-24 HISTORY — DX: Depression, unspecified: F32.A

## 2015-02-24 HISTORY — DX: Angina pectoris, unspecified: I20.9

## 2015-02-24 HISTORY — DX: Hyperlipidemia, unspecified: E78.5

## 2015-02-24 LAB — GLUCOSE, CAPILLARY: Glucose-Capillary: 98 mg/dL (ref 65–99)

## 2015-02-24 SURGERY — COLONOSCOPY
Anesthesia: General

## 2015-02-24 MED ORDER — SODIUM CHLORIDE 0.9 % IV SOLN
INTRAVENOUS | Status: DC
  Administered 2015-02-24: 09:00:00 via INTRAVENOUS

## 2015-02-24 MED ORDER — SODIUM CHLORIDE 0.9 % IV SOLN
INTRAVENOUS | Status: DC | PRN
  Administered 2015-02-24: 10:00:00 via INTRAVENOUS

## 2015-02-24 MED ORDER — PROPOFOL 10 MG/ML IV BOLUS
INTRAVENOUS | Status: DC | PRN
  Administered 2015-02-24 (×2): 50 mg via INTRAVENOUS

## 2015-02-24 MED ORDER — BUTAMBEN-TETRACAINE-BENZOCAINE 2-2-14 % EX AERO
INHALATION_SPRAY | CUTANEOUS | Status: DC | PRN
  Administered 2015-02-24: 1 via TOPICAL

## 2015-02-24 MED ORDER — PROPOFOL INFUSION 10 MG/ML OPTIME
INTRAVENOUS | Status: DC | PRN
  Administered 2015-02-24: 120 ug/kg/min via INTRAVENOUS

## 2015-02-24 MED ORDER — SODIUM CHLORIDE 0.9 % IV SOLN: INTRAVENOUS | Status: DC

## 2015-02-24 NOTE — Op Note (Signed)
[facility] Gastroenterology Patient Name: Teresa Carrillo Procedure Date: 02/24/2015 8:41 AM MRN: [id number] Account #: [id number] Date of Birth: 09-Jul-1941 Admit Type: Outpatient Age: 74 Room: [name] ENDO ROOM 1 Gender: Female Note Status: Finalized Procedure:         Upper GI endoscopy Indications:       Abnormal CT of the GI tract Providers:         Manya Silvas, MD Referring MD:      Otila Back. Manuella Ghazi (Referring MD) Medicines:         Propofol per Anesthesia Complications:     No immediate complications. Procedure:         Pre-Anesthesia Assessment:                    - After reviewing the risks and benefits, the patient was                     deemed in satisfactory condition to undergo the procedure.                    After obtaining informed consent, the endoscope was passed                     under direct vision. Throughout the procedure, the                     patient's blood pressure, pulse, and oxygen saturations                     were monitored continuously. The Olympus GIF-160 endoscope                     (S#. [id number]) was introduced through the mouth, and                     advanced to the second part of duodenum. The upper GI                     endoscopy was accomplished without difficulty. The patient                     tolerated the procedure well. Findings:      The examined esophagus was normal. GEJ 39cm.      A medium-sized hiatus hernia was present.      One non-bleeding superficial gastric ulcer with no stigmata of bleeding       was found in the gastric antrum. Biopsies were taken with a cold forceps       for histology. Biopsies were taken with a cold forceps for Helicobacter       pylori testing.      The examined duodenum was normal. Impression:        - Normal esophagus.                    - Medium-sized hiatus hernia.                    - Gastric ulcer with clean base. Biopsied.                    - Normal examined  duodenum. Recommendation:    - Await pathology results.                    - Perform a  colonoscopy as previously scheduled. Manya Silvas, MD 02/24/2015 9:47:13 AM This report has been signed electronically. Number of Addenda: 0 Note Initiated On: 02/24/2015 8:41 AM      [facility]

## 2015-02-24 NOTE — Anesthesia Preprocedure Evaluation (Signed)
Anesthesia Evaluation  Patient identified by MRN, date of birth, ID band Patient awake    Reviewed: Allergy & Precautions, NPO status , Patient's Chart, lab work & pertinent test results  Airway Mallampati: I  TM Distance: >3 FB Neck ROM: Full    Dental  (+) Teeth Intact   Pulmonary shortness of breath and with exertion, sleep apnea and Continuous Positive Airway Pressure Ventilation , COPD COPD inhaler, former smoker,  breath sounds clear to auscultation        Cardiovascular Exercise Tolerance: Poor hypertension, Pt. on medications and Pt. on home beta blockers Normal cardiovascular examRhythm:Regular Rate:Normal     Neuro/Psych    GI/Hepatic   Endo/Other  diabetes, Type 2  Renal/GU      Musculoskeletal   Abdominal (+) + obese,   Peds  Hematology   Anesthesia Other Findings   Reproductive/Obstetrics                             Anesthesia Physical Anesthesia Plan  ASA: III  Anesthesia Plan: General   Post-op Pain Management:    Induction: Intravenous  Airway Management Planned: Nasal Cannula  Additional Equipment:   Intra-op Plan:   Post-operative Plan:   Informed Consent: I have reviewed the patients History and Physical, chart, labs and discussed the procedure including the risks, benefits and alternatives for the proposed anesthesia with the patient or authorized representative who has indicated his/her understanding and acceptance.     Plan Discussed with: CRNA  Anesthesia Plan Comments:         Anesthesia Quick Evaluation

## 2015-02-24 NOTE — Op Note (Signed)
[facility] Gastroenterology Patient Name: Teresa Carrillo Procedure Date: 02/24/2015 8:53 AM MRN: [id number] Account #: [id number] Date of Birth: Aug 04, 1941 Admit Type: Outpatient Age: 74 Room: [facility] ENDO ROOM 1 Gender: Female Note Status: Finalized Procedure:         Colonoscopy Indications:       Change in bowel habits Providers:         Manya Silvas, MD Medicines:         Propofol per Anesthesia Complications:     No immediate complications. Procedure:         Pre-Anesthesia Assessment:                    - After reviewing the risks and benefits, the patient was                     deemed in satisfactory condition to undergo the procedure.                    After obtaining informed consent, the colonoscope was                     passed under direct vision. Throughout the procedure, the                     patient's blood pressure, pulse, and oxygen saturations                     were monitored continuously. The Olympus PCF-160AL                     colonoscope (S#. [id number]) was introduced through the anus                     and advanced to the the cecum, identified by appendiceal                     orifice and ileocecal valve. The colonoscopy was performed                     without difficulty. The patient tolerated the procedure                     well. The quality of the bowel preparation was good. Findings:      External and internal hemorrhoids were found during endoscopy. The       hemorrhoids were small and Grade I (internal hemorrhoids that do not       prolapse).      The exam was otherwise without abnormality. Impression:        - External and internal hemorrhoids.                    - The examination was otherwise normal.                    - No specimens collected. Recommendation:    - The findings and recommendations were discussed with the                     patient's family. Resume usual meds. Manya Silvas, MD 02/24/2015 10:07:04  AM This report has been signed electronically. Number of Addenda: 0 Note Initiated On: 02/24/2015 8:53 AM Scope Withdrawal Time: 0 hours 9 minutes 4 seconds  Total Procedure Duration: 0 hours 16 minutes 36 seconds  [facility]

## 2015-02-24 NOTE — Transfer of Care (Signed)
Immediate Anesthesia Transfer of Care Note  Patient: Teresa Carrillo  Procedure(s) Performed: Procedure(s): COLONOSCOPY (N/A) ESOPHAGOGASTRODUODENOSCOPY (EGD) (N/A)  Patient Location: PACU  Anesthesia Type:General  Level of Consciousness: awake  Airway & Oxygen Therapy: Patient Spontanous Breathing  Post-op Assessment: Report given to RN  Post vital signs: stable  Last Vitals:  Filed Vitals:   02/24/15 1010  BP: 129/57  Pulse: 61  Temp:   Resp: 9    Complications: No apparent anesthesia complications

## 2015-02-24 NOTE — H&P (Signed)
Primary Care Physician:  Keith Rake, MD Primary Gastroenterologist:  Dr. Vira Agar  Pre-Procedure History & Physical: HPI:  Teresa Carrillo is a 74 y.o. female is here for an endoscopy and colonoscopy.   Past Medical History  Diagnosis Date  . Diabetes mellitus without complication   . Arthritis   . Hypertension   . Depression   . Hyperlipidemia   . Sleep apnea   . GERD (gastroesophageal reflux disease)   . Anginal pain     Past Surgical History  Procedure Laterality Date  . Cholecystectomy    . Abdominal hysterectomy    . Appendectomy    . Cardiac catheterization      x2  . Diagnostic laparoscopy      Removal of benign abdominal tumor  . Right eye surgery Right     Prior to Admission medications   Medication Sig Start Date End Date Taking? Authorizing Provider  bisoprolol-hydrochlorothiazide (ZIAC) 5-6.25 MG per tablet Take 1 tablet by mouth daily.   Yes Historical Provider, MD  buPROPion (WELLBUTRIN XL) 150 MG 24 hr tablet Take 300 mg by mouth daily.   Yes Historical Provider, MD  clonazePAM (KLONOPIN) 0.5 MG tablet Take 0.5 mg by mouth 2 (two) times daily.    Yes Historical Provider, MD  lisinopril (PRINIVIL,ZESTRIL) 5 MG tablet Take 5 mg by mouth daily.    Yes Historical Provider, MD  metFORMIN (GLUCOPHAGE) 500 MG tablet Take 500 mg by mouth daily.    Yes Historical Provider, MD  nitroGLYCERIN (NITROSTAT) 0.4 MG SL tablet Place 0.4 mg under the tongue every 5 (five) minutes as needed for chest pain.   Yes Historical Provider, MD  rosuvastatin (CRESTOR) 20 MG tablet Take 20 mg by mouth daily.    Yes Historical Provider, MD  sertraline (ZOLOFT) 100 MG tablet Take 100 mg by mouth daily.    Yes Historical Provider, MD  Umeclidinium-Vilanterol (ANORO ELLIPTA) 62.5-25 MCG/INH AEPB Inhale 1 puff into the lungs daily as needed. 05/17/14  Yes Historical Provider, MD  Multiple Vitamin (MULTIVITAMIN WITH MINERALS) TABS tablet Take 1 tablet by mouth daily.    Historical Provider, MD   Omega-3 Fatty Acids (FISH OIL) 1000 MG CAPS Take 1 capsule by mouth daily.    Historical Provider, MD    Allergies as of 02/17/2015  . (No Known Allergies)    History reviewed. No pertinent family history.  History   Social History  . Marital Status: Married    Spouse Name: N/A  . Number of Children: N/A  . Years of Education: N/A   Occupational History  . Not on file.   Social History Main Topics  . Smoking status: Former Smoker    Quit date: 09/09/1988  . Smokeless tobacco: Never Used  . Alcohol Use: No  . Drug Use: No  . Sexual Activity: Not on file   Other Topics Concern  . Not on file   Social History Narrative    Review of Systems: See HPI, otherwise negative ROS  Physical Exam: BP 163/59 mmHg  Pulse 56  Temp(Src) 98 F (36.7 C) (Tympanic)  Resp 20  Ht 5\' 3"  (1.6 m)  Wt 109.77 kg (242 lb)  BMI 42.88 kg/m2  SpO2 100% General:   Alert,  pleasant and cooperative in NAD Head:  Normocephalic and atraumatic. Neck:  Supple; no masses or thyromegaly. Lungs:  Clear throughout to auscultation.    Heart:  Regular rate and rhythm. Abdomen:  Soft, nontender and nondistended. Normal bowel sounds, without guarding,  and without rebound.   Neurologic:  Alert and  oriented x4;  grossly normal neurologically.  Impression/Plan: Teresa Carrillo is here for an endoscopy and colonoscopy to be performed for abnormal esophagus on CT scan and change in bowel habits.  Risks, benefits, limitations, and alternatives regarding  endoscopy and colonoscopy have been reviewed with the patient.  Questions have been answered.  All parties agreeable.   Gaylyn Cheers, MD  02/24/2015, 9:28 AM   Primary Care Physician:  Keith Rake, MD Primary Gastroenterologist:  Dr. Vira Agar  Pre-Procedure History & Physical: HPI:  Teresa Carrillo is a 74 y.o. female is here for an endoscopy and colonoscopy.   Past Medical History  Diagnosis Date  . Diabetes mellitus without complication   .  Arthritis   . Hypertension   . Depression   . Hyperlipidemia   . Sleep apnea   . GERD (gastroesophageal reflux disease)   . Anginal pain     Past Surgical History  Procedure Laterality Date  . Cholecystectomy    . Abdominal hysterectomy    . Appendectomy    . Cardiac catheterization      x2  . Diagnostic laparoscopy      Removal of benign abdominal tumor  . Right eye surgery Right     Prior to Admission medications   Medication Sig Start Date End Date Taking? Authorizing Provider  bisoprolol-hydrochlorothiazide (ZIAC) 5-6.25 MG per tablet Take 1 tablet by mouth daily.   Yes Historical Provider, MD  buPROPion (WELLBUTRIN XL) 150 MG 24 hr tablet Take 300 mg by mouth daily.   Yes Historical Provider, MD  clonazePAM (KLONOPIN) 0.5 MG tablet Take 0.5 mg by mouth 2 (two) times daily.    Yes Historical Provider, MD  lisinopril (PRINIVIL,ZESTRIL) 5 MG tablet Take 5 mg by mouth daily.    Yes Historical Provider, MD  metFORMIN (GLUCOPHAGE) 500 MG tablet Take 500 mg by mouth daily.    Yes Historical Provider, MD  nitroGLYCERIN (NITROSTAT) 0.4 MG SL tablet Place 0.4 mg under the tongue every 5 (five) minutes as needed for chest pain.   Yes Historical Provider, MD  rosuvastatin (CRESTOR) 20 MG tablet Take 20 mg by mouth daily.    Yes Historical Provider, MD  sertraline (ZOLOFT) 100 MG tablet Take 100 mg by mouth daily.    Yes Historical Provider, MD  Umeclidinium-Vilanterol (ANORO ELLIPTA) 62.5-25 MCG/INH AEPB Inhale 1 puff into the lungs daily as needed. 05/17/14  Yes Historical Provider, MD  Multiple Vitamin (MULTIVITAMIN WITH MINERALS) TABS tablet Take 1 tablet by mouth daily.    Historical Provider, MD  Omega-3 Fatty Acids (FISH OIL) 1000 MG CAPS Take 1 capsule by mouth daily.    Historical Provider, MD    Allergies as of 02/17/2015  . (No Known Allergies)    History reviewed. No pertinent family history.  History   Social History  . Marital Status: Married    Spouse Name: N/A  .  Number of Children: N/A  . Years of Education: N/A   Occupational History  . Not on file.   Social History Main Topics  . Smoking status: Former Smoker    Quit date: 09/09/1988  . Smokeless tobacco: Never Used  . Alcohol Use: No  . Drug Use: No  . Sexual Activity: Not on file   Other Topics Concern  . Not on file   Social History Narrative    Review of Systems: See HPI, otherwise negative ROS  Physical Exam: BP 163/59 mmHg  Pulse 56  Temp(Src) 98 F (36.7 C) (Tympanic)  Resp 20  Ht 5\' 3"  (1.6 m)  Wt 109.77 kg (242 lb)  BMI 42.88 kg/m2  SpO2 100% General:   Alert,  pleasant and cooperative in NAD Head:  Normocephalic and atraumatic. Neck:  Supple; no masses or thyromegaly. Lungs:  Clear throughout to auscultation.    Heart:  Regular rate and rhythm. Abdomen:  Soft, nontender and nondistended. Normal bowel sounds, without guarding, and without rebound.   Neurologic:  Alert and  oriented x4;  grossly normal neurologically.  Impression/Plan: Teresa Carrillo is here for an endoscopy and colonoscopy to be performed for change in bowel habits and abnormal esophagus on CT scan.  Risks, benefits, limitations, and alternatives regarding  endoscopy and colonoscopy have been reviewed with the patient.  Questions have been answered.  All parties agreeable.   Gaylyn Cheers, MD  02/24/2015, 9:28 AM

## 2015-02-24 NOTE — Anesthesia Postprocedure Evaluation (Signed)
  Anesthesia Post-op Note  Patient: Teresa Carrillo  Procedure(s) Performed: Procedure(s): COLONOSCOPY (N/A) ESOPHAGOGASTRODUODENOSCOPY (EGD) (N/A)  Anesthesia type:General  Patient location: PACU  Post pain: Pain level controlled  Post assessment: Post-op Vital signs reviewed, Patient's Cardiovascular Status Stable, Respiratory Function Stable, Patent Airway and No signs of Nausea or vomiting  Post vital signs: Reviewed and stable  Last Vitals:  Filed Vitals:   02/24/15 1010  BP: 129/57  Pulse: 61  Temp:   Resp: 9    Level of consciousness: awake, alert  and patient cooperative  Complications: No apparent anesthesia complications

## 2015-02-27 ENCOUNTER — Other Ambulatory Visit: Payer: Self-pay | Admitting: Family Medicine

## 2015-02-27 DIAGNOSIS — N200 Calculus of kidney: Secondary | ICD-10-CM

## 2015-02-27 LAB — SURGICAL PATHOLOGY

## 2015-03-06 ENCOUNTER — Encounter: Payer: Self-pay | Admitting: Unknown Physician Specialty

## 2015-04-12 ENCOUNTER — Other Ambulatory Visit: Payer: Self-pay | Admitting: Family Medicine

## 2015-04-26 ENCOUNTER — Ambulatory Visit: Payer: Self-pay | Admitting: Family Medicine

## 2015-04-27 ENCOUNTER — Encounter: Payer: Self-pay | Admitting: *Deleted

## 2015-05-05 ENCOUNTER — Encounter: Payer: Self-pay | Admitting: Family Medicine

## 2015-05-05 ENCOUNTER — Ambulatory Visit (INDEPENDENT_AMBULATORY_CARE_PROVIDER_SITE_OTHER): Payer: Medicare Other | Admitting: Family Medicine

## 2015-05-05 VITALS — BP 136/58 | HR 68 | Temp 98.3°F | Resp 19 | Ht 63.0 in | Wt 244.2 lb

## 2015-05-05 DIAGNOSIS — I10 Essential (primary) hypertension: Secondary | ICD-10-CM | POA: Diagnosis not present

## 2015-05-05 DIAGNOSIS — E785 Hyperlipidemia, unspecified: Secondary | ICD-10-CM

## 2015-05-05 DIAGNOSIS — E119 Type 2 diabetes mellitus without complications: Secondary | ICD-10-CM | POA: Diagnosis not present

## 2015-05-05 MED ORDER — ROSUVASTATIN CALCIUM 20 MG PO TABS: 20.0000 mg | ORAL_TABLET | Freq: Every day | ORAL | Status: DC

## 2015-05-05 NOTE — Progress Notes (Signed)
Name: Teresa Carrillo   MRN: [id number]    DOB: September 03, 1941   Date:05/05/2015       Progress Note  Subjective  Chief Complaint  Chief Complaint  Patient presents with  . Follow-up    DM  . Diabetes  . Shortness of Breath  . COPD  . Hypertension  . Hyperlipidemia    Diabetes She presents for her follow-up diabetic visit. She has type 2 diabetes mellitus. Her disease course has been stable. Pertinent negatives for diabetes include no chest pain. Pertinent negatives for diabetic complications include no CVA. Current diabetic treatment includes oral agent (monotherapy). Her weight is stable. She is following a diabetic diet. Her breakfast blood glucose range is generally 110-130 mg/dl. An ACE inhibitor/angiotensin II receptor blocker is being taken. Eye exam is current.  Hypertension This is a chronic problem. Pertinent negatives include no chest pain, palpitations or shortness of breath. Past treatments include ACE inhibitors, beta blockers and diuretics. There is no history of kidney disease, CAD/MI or CVA.  Hyperlipidemia This is a chronic problem. The problem is controlled. Exacerbating diseases include diabetes and obesity. Pertinent negatives include no chest pain, leg pain, myalgias or shortness of breath. Current antihyperlipidemic treatment includes statins.    Past Medical History  Diagnosis Date  . Diabetes mellitus without complication     elevated A1c  . Arthritis   . Heart murmur   . Depression   . Hyperlipidemia   . Sleep apnea   . GERD (gastroesophageal reflux disease)   . Anginal pain   . Elevated serum creatinine   . Urinary frequency   . HTN (hypertension)   . Fibrocystic breast disease   . Anxiety   . Hearing loss   . Dyspnea on exertion   . Risk for falls   . Cystitis   . Thrombocytopenia   . Urinary urgency   . Abdominal wall mass   . Calculus of kidney   . Obesity   . Microscopic hematuria     Past Surgical History  Procedure Laterality Date  .  Cholecystectomy    . Abdominal hysterectomy    . Appendectomy    . Cardiac catheterization      x2  . Diagnostic laparoscopy      Removal of benign abdominal tumor  . Right eye surgery Right   . Colonoscopy N/A 02/24/2015    Procedure: COLONOSCOPY;  Surgeon: Manya Silvas, MD;  Location: [facility] ENDOSCOPY;  Service: Endoscopy;  Laterality: N/A;  . Esophagogastroduodenoscopy N/A 02/24/2015    Procedure: ESOPHAGOGASTRODUODENOSCOPY (EGD);  Surgeon: Manya Silvas, MD;  Location: [facility] ENDOSCOPY;  Service: Endoscopy;  Laterality: N/A;    Family History  Problem Relation Age of Onset  . Congestive Heart Failure Mother   . Cirrhosis Father   . Diabetes Mother   . Coronary artery disease Mother   . Stroke Mother     Social History   Social History  . Marital Status: Married    Spouse Name: N/A  . Number of Children: N/A  . Years of Education: N/A   Occupational History  . Not on file.   Social History Main Topics  . Smoking status: Former Smoker    Quit date: 09/09/1988  . Smokeless tobacco: Never Used     Comment: quit 25 years ago  . Alcohol Use: No  . Drug Use: No  . Sexual Activity: Not on file   Other Topics Concern  . Not on file   Social History Narrative  Current outpatient prescriptions:  .  bisoprolol-hydrochlorothiazide (ZIAC) 5-6.25 MG per tablet, Take 1 tablet by mouth daily., Disp: , Rfl:  .  buPROPion (WELLBUTRIN XL) 150 MG 24 hr tablet, Take 300 mg by mouth daily., Disp: , Rfl:  .  clonazePAM (KLONOPIN) 0.5 MG tablet, Take 0.5 mg by mouth 2 (two) times daily. , Disp: , Rfl:  .  lisinopril (PRINIVIL,ZESTRIL) 5 MG tablet, Take 5 mg by mouth daily. , Disp: , Rfl:  .  LUMIGAN 0.01 % SOLN, , Disp: , Rfl:  .  metFORMIN (GLUCOPHAGE) 500 MG tablet, TAKE ONE TABLET BY MOUTH EVERY DAY, Disp: 30 tablet, Rfl: 1 .  Multiple Vitamin (MULTIVITAMIN WITH MINERALS) TABS tablet, Take 1 tablet by mouth daily., Disp: , Rfl:  .  nitroGLYCERIN (NITROSTAT) 0.4 MG SL  tablet, Place 0.4 mg under the tongue every 5 (five) minutes as needed for chest pain., Disp: , Rfl:  .  Omega-3 Fatty Acids (FISH OIL) 1000 MG CAPS, Take 1 capsule by mouth daily., Disp: , Rfl:  .  omeprazole (PRILOSEC) 40 MG capsule, , Disp: , Rfl:  .  ONE TOUCH ULTRA TEST test strip, , Disp: , Rfl:  .  rosuvastatin (CRESTOR) 20 MG tablet, Take 20 mg by mouth daily. , Disp: , Rfl:  .  sertraline (ZOLOFT) 100 MG tablet, Take 100 mg by mouth daily. , Disp: , Rfl:  .  Umeclidinium-Vilanterol (ANORO ELLIPTA) 62.5-25 MCG/INH AEPB, Inhale 1 puff into the lungs daily as needed., Disp: , Rfl:   Allergies  Allergen Reactions  . Macrobid [employer] Macro]     Review of Systems  Constitutional: Negative for fever and chills.  Respiratory: Negative for shortness of breath.   Cardiovascular: Negative for chest pain and palpitations.  Gastrointestinal: Negative for abdominal pain.  Musculoskeletal: Negative for myalgias.    Objective  Filed Vitals:   05/05/15 0922  BP: 136/58  Pulse: 68  Temp: 98.3 F (36.8 C)  TempSrc: Oral  Resp: 19  Height: 5\' 3"  (1.6 m)  Weight: 244 lb 3.2 oz (110.768 kg)  SpO2: 92%    Physical Exam  Constitutional: She is oriented to person, place, and time and well-developed, well-nourished, and in no distress.  HENT:  Head: Normocephalic and atraumatic.  Cardiovascular: Normal rate and regular rhythm.   Pulmonary/Chest: Effort normal and breath sounds normal.  Abdominal: Soft. Bowel sounds are normal.  Neurological: She is alert and oriented to person, place, and time.  Skin: Skin is warm and dry.  Nursing note and vitals reviewed.   Assessment & Plan  1. Type 2 diabetes mellitus without complication  - HgB J1O - Urine Microalbumin w/creat. ratio  2. Hyperlipidemia  - Lipid Profile - COMPLETE METABOLIC PANEL WITH GFR - rosuvastatin (CRESTOR) 20 MG tablet; Take 1 tablet (20 mg total) by mouth daily.  Dispense: 90 tablet; Refill:  1  3. Essential hypertension Blood pressure is stable and controlled on present therapy.   Haik Mahoney [facility] 05/05/2015 9:43 AM

## 2015-05-06 LAB — COMPREHENSIVE METABOLIC PANEL
ALBUMIN: 4.2 g/dL (ref 3.5–4.8)
ALT: 16 IU/L (ref 0–32)
AST: 17 IU/L (ref 0–40)
Albumin/Globulin Ratio: 1.5 (ref 1.1–2.5)
Alkaline Phosphatase: 80 IU/L (ref 39–117)
BILIRUBIN TOTAL: 0.3 mg/dL (ref 0.0–1.2)
BUN / CREAT RATIO: 21 (ref 11–26)
BUN: 20 mg/dL (ref 8–27)
CALCIUM: 9.8 mg/dL (ref 8.7–10.3)
CHLORIDE: 103 mmol/L (ref 97–108)
CO2: 27 mmol/L (ref 18–29)
Creatinine, Ser: 0.95 mg/dL (ref 0.57–1.00)
GFR, EST AFRICAN AMERICAN: 69 mL/min/{1.73_m2} (ref >59)
GFR, EST NON AFRICAN AMERICAN: 60 mL/min/{1.73_m2} (ref >59)
GLUCOSE: 114 mg/dL — AB (ref 65–99)
Globulin, Total: 2.8 g/dL (ref 1.5–4.5)
Potassium: 5.2 mmol/L (ref 3.5–5.2)
Sodium: 145 mmol/L — ABNORMAL HIGH (ref 134–144)
TOTAL PROTEIN: 7 g/dL (ref 6.0–8.5)

## 2015-05-06 LAB — MICROALBUMIN / CREATININE URINE RATIO
Creatinine, Urine: 52.2 mg/dL
MICROALB/CREAT RATIO: 184.1 mg/g{creat} — AB (ref 0.0–30.0)
Microalbumin, Urine: 96.1 ug/mL

## 2015-05-06 LAB — LIPID PANEL
CHOL/HDL RATIO: 3.1 ratio (ref 0.0–4.4)
CHOLESTEROL TOTAL: 151 mg/dL (ref 100–199)
HDL: 48 mg/dL (ref >39)
LDL CALC: 78 mg/dL (ref 0–99)
TRIGLYCERIDES: 124 mg/dL (ref 0–149)
VLDL Cholesterol Cal: 25 mg/dL (ref 5–40)

## 2015-05-06 LAB — HEMOGLOBIN A1C
Est. average glucose Bld gHb Est-mCnc: 134 mg/dL
HEMOGLOBIN A1C: 6.3 % — AB (ref 4.8–5.6)

## 2015-05-08 ENCOUNTER — Ambulatory Visit
Admission: RE | Admit: 2015-05-08 | Discharge: 2015-05-08 | Disposition: A | Discharge status: Home / Self Care | Payer: Medicare Other | Source: Ambulatory Visit | Attending: Urology | Admitting: Urology

## 2015-05-08 ENCOUNTER — Encounter: Payer: Self-pay | Admitting: Urology

## 2015-05-08 ENCOUNTER — Ambulatory Visit (INDEPENDENT_AMBULATORY_CARE_PROVIDER_SITE_OTHER): Payer: Medicare Other | Admitting: Urology

## 2015-05-08 ENCOUNTER — Ambulatory Visit: Payer: Self-pay | Admitting: Urology

## 2015-05-08 VITALS — BP 136/72 | HR 68 | Resp 18 | Ht 63.0 in | Wt 246.9 lb

## 2015-05-08 DIAGNOSIS — N200 Calculus of kidney: Secondary | ICD-10-CM | POA: Insufficient documentation

## 2015-05-08 DIAGNOSIS — R3129 Other microscopic hematuria: Secondary | ICD-10-CM | POA: Insufficient documentation

## 2015-05-08 DIAGNOSIS — R312 Other microscopic hematuria: Secondary | ICD-10-CM | POA: Diagnosis not present

## 2015-05-08 LAB — URINALYSIS, COMPLETE
Bilirubin, UA: NEGATIVE
GLUCOSE, UA: NEGATIVE
Ketones, UA: NEGATIVE
NITRITE UA: NEGATIVE
Protein, UA: NEGATIVE
Specific Gravity, UA: 1.01 (ref 1.005–1.030)
Urobilinogen, Ur: 0.2 mg/dL (ref 0.2–1.0)
pH, UA: 6 (ref 5.0–7.5)

## 2015-05-08 LAB — MICROSCOPIC EXAMINATION
Bacteria, UA: NONE SEEN
Epithelial Cells (non renal): NONE SEEN /hpf (ref 0–10)

## 2015-05-08 NOTE — Progress Notes (Signed)
05/08/2015 8:51 PM   Teresa Carrillo [birth year] [id number]  Referring provider: Roselee Nova, MD [address] [place], [place] [address]  Chief Complaint  Patient presents with  . Follow-up    6 mo.  . Hematuria    HPI: Patient is a 74 year old white female who presented with microscopic hematuria and had a hematuria work-up with CT Urogram and cystoscopy.    The workup did not find any GU pathology. She was found to have bilateral benign renal cysts and a 2 mm right renal calculus.  She has not had any gross hematuria, suprapubic pain, dysuria or flank pain.  Her KUB taken today did not demonstrate any renal or ureteral calculi.  She denies any fevers, chills, nausea or vomiting.  She has no urinary complaints at this time.  Her UA is still positive for microscopic hematuria with 3-10 RBC's per high-power field.  She states that ever since the child she's been told she's had blood in her urine.    PMH: Past Medical History  Diagnosis Date  . Diabetes mellitus without complication     elevated A1c  . Arthritis   . Heart murmur   . Depression   . Hyperlipidemia   . Sleep apnea   . GERD (gastroesophageal reflux disease)   . Anginal pain   . Elevated serum creatinine   . Urinary frequency   . HTN (hypertension)   . Fibrocystic breast disease   . Anxiety   . Hearing loss   . Dyspnea on exertion   . Risk for falls   . Cystitis   . Thrombocytopenia   . Urinary urgency   . Abdominal wall mass   . Calculus of kidney   . Obesity   . Microscopic hematuria     Surgical History: Past Surgical History  Procedure Laterality Date  . Cholecystectomy    . Abdominal hysterectomy    . Appendectomy    . Cardiac catheterization      x2  . Diagnostic laparoscopy      Removal of benign abdominal tumor  . Right eye surgery Right   . Colonoscopy N/A 02/24/2015    Procedure: COLONOSCOPY;  Surgeon: Manya Silvas, MD;  Location: [facility] ENDOSCOPY;  Service:  Endoscopy;  Laterality: N/A;  . Esophagogastroduodenoscopy N/A 02/24/2015    Procedure: ESOPHAGOGASTRODUODENOSCOPY (EGD);  Surgeon: Manya Silvas, MD;  Location: [facility] ENDOSCOPY;  Service: Endoscopy;  Laterality: N/A;    Home Medications:    Medication List       This list is accurate as of: 05/08/15  8:51 PM.  Always use your most recent med list.               ANORO ELLIPTA 62.5-25 MCG/INH Aepb  Generic drug:  Umeclidinium-Vilanterol  Inhale 1 puff into the lungs daily as needed.     bisoprolol-hydrochlorothiazide 5-6.25 MG per tablet  Commonly known as:  ZIAC  Take 1 tablet by mouth daily.     buPROPion 150 MG 24 hr tablet  Commonly known as:  WELLBUTRIN XL  Take 300 mg by mouth daily.     clonazePAM 0.5 MG tablet  Commonly known as:  KLONOPIN  Take 0.5 mg by mouth 2 (two) times daily.     Fish Oil 1000 MG Caps  Take 1 capsule by mouth daily.     lisinopril 5 MG tablet  Commonly known as:  PRINIVIL,ZESTRIL  Take 5 mg by mouth daily.  LUMIGAN 0.01 % Soln  Generic drug:  bimatoprost     metFORMIN 500 MG tablet  Commonly known as:  GLUCOPHAGE  TAKE ONE TABLET BY MOUTH EVERY DAY     multivitamin with minerals Tabs tablet  Take 1 tablet by mouth daily.     omeprazole 40 MG capsule  Commonly known as:  PRILOSEC     ONE TOUCH ULTRA TEST test strip  Generic drug:  glucose blood     rosuvastatin 20 MG tablet  Commonly known as:  CRESTOR  Take 1 tablet (20 mg total) by mouth daily.     sertraline 100 MG tablet  Commonly known as:  ZOLOFT  Take 100 mg by mouth daily.        Allergies:  Allergies  Allergen Reactions  . Macrobid [employer] Macro]     Family History: Family History  Problem Relation Age of Onset  . Congestive Heart Failure Mother   . Cirrhosis Father   . Diabetes Mother   . Coronary artery disease Mother   . Stroke Mother     Social History:  reports that she quit smoking about 26 years ago. She has never used  smokeless tobacco. She reports that she does not drink alcohol or use illicit drugs.  ROS: UROLOGY Frequent Urination?: No Hard to postpone urination?: No Burning/pain with urination?: No Get up at night to urinate?: No Leakage of urine?: No Urine stream starts and stops?: No Trouble starting stream?: No Do you have to strain to urinate?: No Blood in urine?: No Urinary tract infection?: No Sexually transmitted disease?: No Injury to kidneys or bladder?: No Painful intercourse?: No Weak stream?: No Currently pregnant?: No Vaginal bleeding?: No Last menstrual period?: n/a  Gastrointestinal Nausea?: No Vomiting?: No Indigestion/heartburn?: No Diarrhea?: No Constipation?: No  Constitutional Fever: No Night sweats?: No Weight loss?: No Fatigue?: No  Skin Skin rash/lesions?: No Itching?: No  Eyes Blurred vision?: No Double vision?: No  Ears/Nose/Throat Sore throat?: No Sinus problems?: No  Hematologic/Lymphatic Swollen glands?: No Easy bruising?: No  Cardiovascular Leg swelling?: No Chest pain?: No  Respiratory Cough?: No Shortness of breath?: No  Endocrine Excessive thirst?: No  Musculoskeletal Back pain?: No Joint pain?: No  Neurological Headaches?: No Dizziness?: No  Psychologic Depression?: No Anxiety?: No  Physical Exam: BP 136/72 mmHg  Pulse 68  Resp 18  Ht 5\' 3"  (1.6 m)  Wt 246 lb 14.4 oz (111.993 kg)  BMI 43.75 kg/m2   Laboratory Data: Results for orders placed or performed in visit on 05/08/15  Microscopic Examination  Result Value Ref Range   WBC, UA 0-5 0 -  5 /hpf   RBC, UA 3-10 (A) 0 -  2 /hpf   Epithelial Cells (non renal) None seen 0 - 10 /hpf   Mucus, UA Present (A) Not Estab.   Bacteria, UA None seen None seen/Few  Urinalysis, Complete  Result Value Ref Range   Specific Gravity, UA 1.010 1.005 - 1.030   pH, UA 6.0 5.0 - 7.5   Color, UA Yellow Yellow   Appearance Ur Clear Clear   Leukocytes, UA Trace (A)  Negative   Protein, UA Negative Negative/Trace   Glucose, UA Negative Negative   Ketones, UA Negative Negative   RBC, UA 2+ (A) Negative   Bilirubin, UA Negative Negative   Urobilinogen, Ur 0.2 0.2 - 1.0 mg/dL   Nitrite, UA Negative Negative   Microscopic Examination See below:     Lab Results  Component Value Date  CREATININE 0.95 05/05/2015    Lab Results  Component Value Date   HGBA1C 6.3* 05/05/2015    Pertinent Imaging: CLINICAL DATA: History of right-sided renal calculus  EXAM: ABDOMEN - 1 VIEW  COMPARISON: CT abdomen and pelvis November 02, 2014  FINDINGS: There are small splenic granulomas on the left, also noted on prior CT. There is stool overlying the right kidney region. No calcification is seen in the right renal region. No calcifications suspicious for right ureteral calculus is seen. There are small phleboliths in the pelvis.  Bowel gas pattern is unremarkable. No obstruction or free air is seen on this supine examination. Lung bases are clear. There are surgical clips in the right upper quadrant region.  IMPRESSION: No renal or ureteral calculus is appreciated on this study. Note that overlying stool could mask a small calculus. There are splenic granulomas, calcified. There is moderate stool in the colon. The bowel gas pattern is unremarkable.   Electronically Signed  By: Lowella Grip III M.D.  On: 05/08/2015 13:02  Assessment & Plan:   1. Microscopic hematuria: Patient continues to have microscopic hematuria. Her hematuria workup with urology has failed to find the source of the hematuria. We will refer her to nephrology for further evaluation.     - Urinalysis, Complete  2. Right renal stone:   She does have an incidental 2 mm RIGHT lower pole stone which is currently asymptomatic. I would not recommend any treatment for this, continued observation is recommended.   Return for nephrology referral.  Zara Council,  [facility]  [facility] [address], [place], [place] [address] [phone]

## 2015-05-10 ENCOUNTER — Other Ambulatory Visit: Payer: Self-pay | Admitting: Family Medicine

## 2015-05-10 DIAGNOSIS — R809 Proteinuria, unspecified: Secondary | ICD-10-CM

## 2015-05-11 ENCOUNTER — Other Ambulatory Visit: Payer: Self-pay | Admitting: Family Medicine

## 2015-06-30 ENCOUNTER — Telehealth: Payer: Self-pay | Admitting: Family Medicine

## 2015-06-30 ENCOUNTER — Other Ambulatory Visit: Payer: Self-pay | Admitting: Family Medicine

## 2015-06-30 MED ORDER — METFORMIN HCL 500 MG PO TABS: 500.0000 mg | ORAL_TABLET | Freq: Every day | ORAL | Status: DC

## 2015-06-30 MED ORDER — BISOPROLOL-HYDROCHLOROTHIAZIDE 5-6.25 MG PO TABS: 1.0000 | ORAL_TABLET | Freq: Every day | ORAL | Status: DC

## 2015-06-30 NOTE — Telephone Encounter (Signed)
Metformin 500 mg has been refilled and sent to [facility] per pharmacy request Clonazepam 0.5mg  has been routed to Dr. Manuella Ghazi for approval

## 2015-06-30 NOTE — Telephone Encounter (Signed)
Medication has been refilled and sent to [facility] per Pharmacy

## 2015-07-10 DIAGNOSIS — M79675 Pain in left toe(s): Secondary | ICD-10-CM | POA: Diagnosis not present

## 2015-07-10 DIAGNOSIS — L600 Ingrowing nail: Secondary | ICD-10-CM | POA: Diagnosis not present

## 2015-07-13 ENCOUNTER — Telehealth: Payer: Self-pay | Admitting: Family Medicine

## 2015-07-13 MED ORDER — LISINOPRIL 5 MG PO TABS: 5.0000 mg | ORAL_TABLET | Freq: Every day | ORAL | Status: DC

## 2015-07-13 NOTE — Telephone Encounter (Signed)
Medication has been refilled and sent to [place]

## 2015-07-13 NOTE — Telephone Encounter (Signed)
Eric from [place] is requesting a refill on Lisinopril 5mg  please fax to [phone]

## 2015-08-10 DIAGNOSIS — J9801 Acute bronchospasm: Secondary | ICD-10-CM | POA: Diagnosis not present

## 2015-08-10 DIAGNOSIS — G4733 Obstructive sleep apnea (adult) (pediatric): Secondary | ICD-10-CM | POA: Diagnosis not present

## 2015-08-10 DIAGNOSIS — R0602 Shortness of breath: Secondary | ICD-10-CM | POA: Diagnosis not present

## 2015-08-10 DIAGNOSIS — R05 Cough: Secondary | ICD-10-CM | POA: Diagnosis not present

## 2015-08-10 DIAGNOSIS — J449 Chronic obstructive pulmonary disease, unspecified: Secondary | ICD-10-CM | POA: Diagnosis not present

## 2015-08-22 ENCOUNTER — Other Ambulatory Visit: Payer: Self-pay | Admitting: Family Medicine

## 2015-08-25 DIAGNOSIS — I129 Hypertensive chronic kidney disease with stage 1 through stage 4 chronic kidney disease, or unspecified chronic kidney disease: Secondary | ICD-10-CM | POA: Diagnosis not present

## 2015-08-25 DIAGNOSIS — R319 Hematuria, unspecified: Secondary | ICD-10-CM | POA: Diagnosis not present

## 2015-08-25 DIAGNOSIS — N183 Chronic kidney disease, stage 3 (moderate): Secondary | ICD-10-CM | POA: Diagnosis not present

## 2015-08-25 DIAGNOSIS — E1122 Type 2 diabetes mellitus with diabetic chronic kidney disease: Secondary | ICD-10-CM | POA: Diagnosis not present

## 2015-08-25 DIAGNOSIS — R809 Proteinuria, unspecified: Secondary | ICD-10-CM | POA: Diagnosis not present

## 2015-09-05 ENCOUNTER — Encounter: Payer: Self-pay | Admitting: Family Medicine

## 2015-09-05 ENCOUNTER — Ambulatory Visit (INDEPENDENT_AMBULATORY_CARE_PROVIDER_SITE_OTHER): Payer: Medicare Other | Admitting: Family Medicine

## 2015-09-05 VITALS — BP 136/70 | HR 75 | Temp 98.8°F | Resp 16 | Ht 63.0 in | Wt 249.3 lb

## 2015-09-05 DIAGNOSIS — E119 Type 2 diabetes mellitus without complications: Secondary | ICD-10-CM

## 2015-09-05 DIAGNOSIS — Z1231 Encounter for screening mammogram for malignant neoplasm of breast: Secondary | ICD-10-CM

## 2015-09-05 DIAGNOSIS — E785 Hyperlipidemia, unspecified: Secondary | ICD-10-CM | POA: Diagnosis not present

## 2015-09-05 DIAGNOSIS — I10 Essential (primary) hypertension: Secondary | ICD-10-CM

## 2015-09-05 LAB — GLUCOSE, POCT (MANUAL RESULT ENTRY): POC Glucose: 127 mg/dl — AB (ref 70–99)

## 2015-09-05 LAB — POCT GLYCOSYLATED HEMOGLOBIN (HGB A1C): Hemoglobin A1C: 6.3

## 2015-09-05 NOTE — Progress Notes (Signed)
Name: Teresa Carrillo   MRN: [id number]    DOB: [birth year]   Date:09/05/2015       Progress Note  Subjective  Chief Complaint  Chief Complaint  Patient presents with  . Follow-up    3 mo  . Hypertension  . Hyperlipidemia  . Diabetes    Hypertension This is a chronic problem. The problem is controlled. Pertinent negatives include no blurred vision, chest pain, headaches, palpitations or shortness of breath. Past treatments include diuretics, beta blockers and ACE inhibitors. There is no history of kidney disease, CAD/MI or CVA.  Hyperlipidemia This is a chronic problem. The problem is controlled. Recent lipid tests were reviewed and are normal. Exacerbating diseases include obesity. Pertinent negatives include no chest pain, leg pain, myalgias or shortness of breath. Current antihyperlipidemic treatment includes statins.  Diabetes She presents for her follow-up diabetic visit. She has type 2 diabetes mellitus. Her disease course has been stable. Pertinent negatives for hypoglycemia include no headaches. Pertinent negatives for diabetes include no blurred vision, no chest pain, no fatigue, no foot paresthesias, no polydipsia and no polyuria. Symptoms are stable. Pertinent negatives for diabetic complications include no CVA. Current diabetic treatment includes diet and oral agent (monotherapy). Her weight is stable. She is following a diabetic diet. Her breakfast blood glucose range is generally 110-130 mg/dl. An ACE inhibitor/angiotensin II receptor blocker is being taken. She does not see a podiatrist.Eye exam is current.    Past Medical History  Diagnosis Date  . Diabetes mellitus without complication (HCC)     elevated A1c  . Arthritis   . Heart murmur   . Depression   . Hyperlipidemia   . Sleep apnea   . GERD (gastroesophageal reflux disease)   . Anginal pain ([place])   . Elevated serum creatinine   . Urinary frequency   . HTN (hypertension)   . Fibrocystic breast disease   .  Anxiety   . Hearing loss   . Dyspnea on exertion   . Risk for falls   . Cystitis   . Thrombocytopenia ([place])   . Urinary urgency   . Abdominal wall mass   . Calculus of kidney   . Obesity   . Microscopic hematuria     Past Surgical History  Procedure Laterality Date  . Cholecystectomy    . Abdominal hysterectomy    . Appendectomy    . Cardiac catheterization      x2  . Diagnostic laparoscopy      Removal of benign abdominal tumor  . Right eye surgery Right   . Colonoscopy N/A 02/24/2015    Procedure: COLONOSCOPY;  Surgeon: Manya Silvas, MD;  Location: [facility] ENDOSCOPY;  Service: Endoscopy;  Laterality: N/A;  . Esophagogastroduodenoscopy N/A 02/24/2015    Procedure: ESOPHAGOGASTRODUODENOSCOPY (EGD);  Surgeon: Manya Silvas, MD;  Location: [facility] ENDOSCOPY;  Service: Endoscopy;  Laterality: N/A;    Family History  Problem Relation Age of Onset  . Congestive Heart Failure Mother   . Cirrhosis Father   . Diabetes Mother   . Coronary artery disease Mother   . Stroke Mother     Social History   Social History  . Marital Status: Married    Spouse Name: N/A  . Number of Children: N/A  . Years of Education: N/A   Occupational History  . Not on file.   Social History Main Topics  . Smoking status: Former Smoker    Quit date: 09/09/1988  . Smokeless tobacco: Never Used  Comment: quit 25 years ago  . Alcohol Use: No  . Drug Use: No  . Sexual Activity: Not on file   Other Topics Concern  . Not on file   Social History Narrative    Current outpatient prescriptions:  .  bisoprolol-hydrochlorothiazide (ZIAC) 5-6.25 MG tablet, Take 1 tablet by mouth daily., Disp: 90 tablet, Rfl: 0 .  buPROPion (WELLBUTRIN XL) 150 MG 24 hr tablet, Take 300 mg by mouth daily., Disp: , Rfl:  .  clonazePAM (KLONOPIN) 0.5 MG tablet, Take 0.5 mg by mouth 2 (two) times daily. , Disp: , Rfl:  .  lisinopril (PRINIVIL,ZESTRIL) 5 MG tablet, Take 1 tablet (5 mg total) by mouth daily., Disp:  90 tablet, Rfl: 0 .  LUMIGAN 0.01 % SOLN, , Disp: , Rfl:  .  metFORMIN (GLUCOPHAGE) 500 MG tablet, TAKE 1 TABLET BY MOUTH DAILY, Disp: 30 tablet, Rfl: 0 .  Multiple Vitamin (MULTIVITAMIN WITH MINERALS) TABS tablet, Take 1 tablet by mouth daily., Disp: , Rfl:  .  Omega-3 Fatty Acids (FISH OIL) 1000 MG CAPS, Take 1 capsule by mouth daily., Disp: , Rfl:  .  omeprazole (PRILOSEC) 40 MG capsule, , Disp: , Rfl:  .  ONE TOUCH ULTRA TEST test strip, , Disp: , Rfl:  .  PROAIR HFA 108 (90 BASE) MCG/ACT inhaler, INHALE 2 INHALATIONS INTO THE LUNGS Q 6 H PRN FOR WHEEZING, Disp: , Rfl: 0 .  rosuvastatin (CRESTOR) 20 MG tablet, Take 1 tablet (20 mg total) by mouth daily., Disp: 90 tablet, Rfl: 1 .  sertraline (ZOLOFT) 100 MG tablet, Take 100 mg by mouth daily. , Disp: , Rfl:  .  Umeclidinium-Vilanterol (ANORO ELLIPTA) 62.5-25 MCG/INH AEPB, Inhale 1 puff into the lungs daily as needed., Disp: , Rfl:   Allergies  Allergen Reactions  . Macrobid [employer] Macro]      Review of Systems  Constitutional: Negative for fatigue.  Eyes: Negative for blurred vision.  Respiratory: Negative for shortness of breath.   Cardiovascular: Negative for chest pain and palpitations.  Gastrointestinal: Negative for abdominal pain.  Musculoskeletal: Negative for myalgias.  Neurological: Negative for headaches.  Endo/Heme/Allergies: Negative for polydipsia.    Objective  Filed Vitals:   09/05/15 0822  BP: 136/70  Pulse: 75  Temp: 98.8 F (37.1 C)  TempSrc: Oral  Resp: 16  Height: 5\' 3"  (1.6 m)  Weight: 249 lb 4.8 oz (113.082 kg)  SpO2: 97%    Physical Exam  Constitutional: She is oriented to person, place, and time and well-developed, well-nourished, and in no distress.  HENT:  Head: Normocephalic and atraumatic.  Cardiovascular: Normal rate and regular rhythm.   Pulmonary/Chest: Effort normal and breath sounds normal.  Abdominal: Soft. Bowel sounds are normal.  Neurological: She is alert  and oriented to person, place, and time.  Skin: Skin is warm and dry.  Nursing note and vitals reviewed.   Assessment & Plan  1. Essential hypertension BP stable on present therapy.  2. Type 2 diabetes mellitus without complication, without long-term current use of insulin (HCC)  - Urine Microalbumin w/creat. ratio - POCT HgB A1C - POCT Glucose (CBG)  3. Hyperlipidemia FLP reviewed from August 2016. Recheck in March 2017.  4. Encounter for screening mammogram for malignant neoplasm of breast  - MM Digital Screening; Future   Alauna Hayden [facility] 09/05/2015 8:36 AM

## 2015-09-06 LAB — MICROALBUMIN / CREATININE URINE RATIO
Creatinine, Urine: 65.3 mg/dL
MICROALB/CREAT RATIO: 110.7 mg/g creat — ABNORMAL HIGH (ref 0.0–30.0)
Microalbumin, Urine: 72.3 ug/mL

## 2015-09-12 DIAGNOSIS — M898X9 Other specified disorders of bone, unspecified site: Secondary | ICD-10-CM | POA: Diagnosis not present

## 2015-09-12 DIAGNOSIS — M79675 Pain in left toe(s): Secondary | ICD-10-CM | POA: Diagnosis not present

## 2015-09-21 ENCOUNTER — Other Ambulatory Visit: Payer: Self-pay | Admitting: Family Medicine

## 2015-09-26 ENCOUNTER — Other Ambulatory Visit: Payer: Self-pay | Admitting: Family Medicine

## 2015-10-03 ENCOUNTER — Telehealth: Payer: Self-pay | Admitting: Family Medicine

## 2015-10-03 NOTE — Telephone Encounter (Signed)
Patient states that she received a call from this office but is not sure who called. She is also checking status on her mammogram appointment with [facility] (across from the hospital).

## 2015-10-05 NOTE — Telephone Encounter (Signed)
Lab results have been reported to patient  

## 2015-10-06 DIAGNOSIS — H401131 Primary open-angle glaucoma, bilateral, mild stage: Secondary | ICD-10-CM | POA: Diagnosis not present

## 2015-10-06 DIAGNOSIS — E119 Type 2 diabetes mellitus without complications: Secondary | ICD-10-CM | POA: Diagnosis not present

## 2015-10-06 DIAGNOSIS — H53001 Unspecified amblyopia, right eye: Secondary | ICD-10-CM | POA: Diagnosis not present

## 2015-10-06 DIAGNOSIS — H2510 Age-related nuclear cataract, unspecified eye: Secondary | ICD-10-CM | POA: Diagnosis not present

## 2015-10-14 ENCOUNTER — Other Ambulatory Visit: Payer: Self-pay | Admitting: Family Medicine

## 2015-10-16 DIAGNOSIS — M79675 Pain in left toe(s): Secondary | ICD-10-CM | POA: Diagnosis not present

## 2015-10-16 DIAGNOSIS — L600 Ingrowing nail: Secondary | ICD-10-CM | POA: Diagnosis not present

## 2015-10-25 ENCOUNTER — Other Ambulatory Visit: Payer: Self-pay | Admitting: Family Medicine

## 2015-11-20 ENCOUNTER — Telehealth: Payer: Self-pay | Admitting: Family Medicine

## 2015-11-20 DIAGNOSIS — E785 Hyperlipidemia, unspecified: Secondary | ICD-10-CM

## 2015-11-20 NOTE — Telephone Encounter (Signed)
Pt needs refill on her cholesterol medication to be sent to [facility]. Pt also wants a return call about her mammogram.

## 2015-11-21 MED ORDER — ROSUVASTATIN CALCIUM 20 MG PO TABS: 20.0000 mg | ORAL_TABLET | Freq: Every day | ORAL | Status: DC

## 2015-11-21 NOTE — Telephone Encounter (Signed)
Medication has been refilled and sent to [facility] [place] 

## 2015-11-22 ENCOUNTER — Other Ambulatory Visit: Payer: Self-pay | Admitting: Family Medicine

## 2015-11-23 ENCOUNTER — Other Ambulatory Visit: Payer: Self-pay | Admitting: Family Medicine

## 2015-11-24 ENCOUNTER — Other Ambulatory Visit: Payer: Self-pay | Admitting: Family Medicine

## 2015-12-14 DIAGNOSIS — R0609 Other forms of dyspnea: Secondary | ICD-10-CM | POA: Diagnosis not present

## 2015-12-14 DIAGNOSIS — R0602 Shortness of breath: Secondary | ICD-10-CM | POA: Diagnosis not present

## 2015-12-14 DIAGNOSIS — G4733 Obstructive sleep apnea (adult) (pediatric): Secondary | ICD-10-CM | POA: Diagnosis not present

## 2015-12-14 DIAGNOSIS — J449 Chronic obstructive pulmonary disease, unspecified: Secondary | ICD-10-CM | POA: Diagnosis not present

## 2015-12-22 ENCOUNTER — Other Ambulatory Visit: Payer: Self-pay | Admitting: Family Medicine

## 2015-12-28 DIAGNOSIS — I208 Other forms of angina pectoris: Secondary | ICD-10-CM | POA: Diagnosis not present

## 2015-12-28 DIAGNOSIS — R0602 Shortness of breath: Secondary | ICD-10-CM | POA: Diagnosis not present

## 2015-12-28 DIAGNOSIS — J4520 Mild intermittent asthma, uncomplicated: Secondary | ICD-10-CM | POA: Diagnosis not present

## 2015-12-28 DIAGNOSIS — J449 Chronic obstructive pulmonary disease, unspecified: Secondary | ICD-10-CM | POA: Diagnosis not present

## 2016-01-04 ENCOUNTER — Ambulatory Visit (INDEPENDENT_AMBULATORY_CARE_PROVIDER_SITE_OTHER): Payer: Medicare Other | Admitting: Family Medicine

## 2016-01-04 ENCOUNTER — Encounter: Payer: Self-pay | Admitting: Family Medicine

## 2016-01-04 VITALS — BP 130/80 | HR 75 | Temp 98.7°F | Resp 16 | Ht 63.0 in | Wt 250.1 lb

## 2016-01-04 DIAGNOSIS — I10 Essential (primary) hypertension: Secondary | ICD-10-CM

## 2016-01-04 DIAGNOSIS — E785 Hyperlipidemia, unspecified: Secondary | ICD-10-CM

## 2016-01-04 DIAGNOSIS — E1121 Type 2 diabetes mellitus with diabetic nephropathy: Secondary | ICD-10-CM | POA: Diagnosis not present

## 2016-01-04 LAB — GLUCOSE, POCT (MANUAL RESULT ENTRY): POC GLUCOSE: 116 mg/dL — AB (ref 70–99)

## 2016-01-04 LAB — POCT GLYCOSYLATED HEMOGLOBIN (HGB A1C): Hemoglobin A1C: 6.3

## 2016-01-04 NOTE — Progress Notes (Signed)
Name: Teresa Carrillo   MRN: [id number]    DOB: July 16, 1941   Date:01/04/2016       Progress Note  Subjective  Chief Complaint  Chief Complaint  Patient presents with  . Hypertension    4 month follow up  . Diabetes    Hypertension This is a chronic problem. The problem is controlled. Pertinent negatives include no blurred vision, headaches, palpitations or shortness of breath. Past treatments include beta blockers, diuretics and ACE inhibitors. Hypertensive end-organ damage includes CAD/MI.  Diabetes She presents for her follow-up diabetic visit. She has type 2 diabetes mellitus. Her disease course has been stable. Pertinent negatives for hypoglycemia include no headaches. Pertinent negatives for diabetes include no blurred vision, no fatigue, no polydipsia and no polyuria. Current diabetic treatment includes oral agent (monotherapy). She is following a diabetic diet. Home blood sugar record trend: High of 167 mg/dL. An ACE inhibitor/angiotensin II receptor blocker is being taken. Eye exam is current.  Hyperlipidemia This is a chronic problem. The problem is controlled. Recent lipid tests were reviewed and are normal. Exacerbating diseases include obesity. Pertinent negatives include no leg pain, myalgias or shortness of breath. Current antihyperlipidemic treatment includes statins.     Past Medical History  Diagnosis Date  . Diabetes mellitus without complication (HCC)     elevated A1c  . Arthritis   . Heart murmur   . Depression   . Hyperlipidemia   . Sleep apnea   . GERD (gastroesophageal reflux disease)   . Anginal pain ([place])   . Elevated serum creatinine   . Urinary frequency   . HTN (hypertension)   . Fibrocystic breast disease   . Anxiety   . Hearing loss   . Dyspnea on exertion   . Risk for falls   . Cystitis   . Thrombocytopenia ([place])   . Urinary urgency   . Abdominal wall mass   . Calculus of kidney   . Obesity   . Microscopic hematuria     Past Surgical  History  Procedure Laterality Date  . Cholecystectomy    . Abdominal hysterectomy    . Appendectomy    . Cardiac catheterization      x2  . Diagnostic laparoscopy      Removal of benign abdominal tumor  . Right eye surgery Right   . Colonoscopy N/A 02/24/2015    Procedure: COLONOSCOPY;  Surgeon: Manya Silvas, MD;  Location: [facility] ENDOSCOPY;  Service: Endoscopy;  Laterality: N/A;  . Esophagogastroduodenoscopy N/A 02/24/2015    Procedure: ESOPHAGOGASTRODUODENOSCOPY (EGD);  Surgeon: Manya Silvas, MD;  [facility] END[facility]COPY;  Service: Endoscopy;  Laterality: N/A;    Family History  Problem Relation Age of Onset  . Congestive Heart Failure Mother   . Cirrhosis Father   . Diabetes Mother   . Coronary artery disease Mother   . Stroke Mother     Social History   Social History  . Marital Status: Married    Spouse Name: N/A  . Number of Children: N/A  . Years of Education: N/A   Occupational History  . Not on file.   Social History Main Topics  . Smoking status: Former Smoker    Quit date: 09/09/1988  . Smokeless tobacco: Never Used     Comment: quit 25 years ago  . Alcohol Use: No  . Drug Use: No  . Sexual Activity: Not on file   Other Topics Concern  . Not on file   Social History Narrative  Current outpatient prescriptions:  .  bisoprolol-hydrochlorothiazide (ZIAC) 5-6.25 MG tablet, TAKE 1 TABLET BY MOUTH DAILY, Disp: 90 tablet, Rfl: 0 .  buPROPion (WELLBUTRIN XL) 150 MG 24 hr tablet, Take 300 mg by mouth daily., Disp: , Rfl:  .  clonazePAM (KLONOPIN) 0.5 MG tablet, Take 0.5 mg by mouth 2 (two) times daily. , Disp: , Rfl:  .  lisinopril (PRINIVIL,ZESTRIL) 5 MG tablet, TAKE 1 TABLET BY MOUTH DAILY, Disp: 90 tablet, Rfl: 0 .  LUMIGAN 0.01 % SOLN, , Disp: , Rfl:  .  metFORMIN (GLUCOPHAGE) 500 MG tablet, TAKE 1 TABLET BY MOUTH DAILY (Patient not taking: Reported on 01/04/2016), Disp: 30 tablet, Rfl: 0 .  metFORMIN (GLUCOPHAGE) 500 MG tablet, TAKE 1 TABLET BY  MOUTH DAILY, Disp: 90 tablet, Rfl: 0 .  Multiple Vitamin (MULTIVITAMIN WITH MINERALS) TABS tablet, Take 1 tablet by mouth daily., Disp: , Rfl:  .  Omega-3 Fatty Acids (FISH OIL) 1000 MG CAPS, Take 1 capsule by mouth daily., Disp: , Rfl:  .  omeprazole (PRILOSEC) 40 MG capsule, , Disp: , Rfl:  .  ONE TOUCH ULTRA TEST test strip, , Disp: , Rfl:  .  PROAIR HFA 108 (90 BASE) MCG/ACT inhaler, INHALE 2 INHALATIONS INTO THE LUNGS Q 6 H PRN FOR WHEEZING, Disp: , Rfl: 0 .  rosuvastatin (CRESTOR) 20 MG tablet, Take 1 tablet (20 mg total) by mouth daily., Disp: 90 tablet, Rfl: 0 .  sertraline (ZOLOFT) 100 MG tablet, Take 100 mg by mouth daily. , Disp: , Rfl:  .  Umeclidinium-Vilanterol (ANORO ELLIPTA) 62.5-25 MCG/INH AEPB, Inhale 1 puff into the lungs daily as needed., Disp: , Rfl:   Allergies  Allergen Reactions  . Macrobid [employer] Macro]      Review of Systems  Constitutional: Negative for fatigue.  Eyes: Negative for blurred vision.  Respiratory: Negative for shortness of breath.   Cardiovascular: Negative for palpitations.  Musculoskeletal: Negative for myalgias.  Neurological: Negative for headaches.  Endo/Heme/Allergies: Negative for polydipsia.    Objective  Filed Vitals:   01/04/16 0830  BP: 130/80  Pulse: 75  Temp: 98.7 F (37.1 C)  TempSrc: Oral  Resp: 16  Height: 5\' 3"  (1.6 m)  Weight: 250 lb 1.6 oz (113.445 kg)  SpO2: 99%    Physical Exam  Constitutional: She is oriented to person, place, and time and well-developed, well-nourished, and in no distress.  HENT:  Head: Normocephalic and atraumatic.  Cardiovascular: Normal rate and regular rhythm.   Pulmonary/Chest: Effort normal and breath sounds normal.  Abdominal: Soft. Bowel sounds are normal.  Neurological: She is alert and oriented to person, place, and time.  Nursing note and vitals reviewed.    Assessment & Plan  1. Essential hypertension At goal and stable. Continue present therapy.  2.  Type 2 diabetes mellitus with diabetic nephropathy, without long-term current use of insulin (HCC) At goal and stable, no change in pharmacotherapy. Recheck in 3 months - POCT Glucose (CBG) - POCT HgB A1C  3. Hyperlipidemia Last FLP from 8 months ago is at goal, recheck today. - Lipid Profile   Chloris Marcoux [facility] 01/04/2016 8:37 AM

## 2016-01-05 LAB — LIPID PANEL
CHOL/HDL RATIO: 3.3 ratio (ref 0.0–4.4)
Cholesterol, Total: 137 mg/dL (ref 100–199)
HDL: 42 mg/dL (ref >39)
LDL Calculated: 72 mg/dL (ref 0–99)
TRIGLYCERIDES: 116 mg/dL (ref 0–149)
VLDL Cholesterol Cal: 23 mg/dL (ref 5–40)

## 2016-01-11 ENCOUNTER — Other Ambulatory Visit: Payer: Self-pay | Admitting: Family Medicine

## 2016-01-11 DIAGNOSIS — I208 Other forms of angina pectoris: Secondary | ICD-10-CM | POA: Diagnosis not present

## 2016-01-11 DIAGNOSIS — R0602 Shortness of breath: Secondary | ICD-10-CM | POA: Diagnosis not present

## 2016-01-30 DIAGNOSIS — J449 Chronic obstructive pulmonary disease, unspecified: Secondary | ICD-10-CM | POA: Diagnosis not present

## 2016-01-30 DIAGNOSIS — R0602 Shortness of breath: Secondary | ICD-10-CM | POA: Diagnosis not present

## 2016-01-30 DIAGNOSIS — I208 Other forms of angina pectoris: Secondary | ICD-10-CM | POA: Diagnosis not present

## 2016-01-30 DIAGNOSIS — J4520 Mild intermittent asthma, uncomplicated: Secondary | ICD-10-CM | POA: Diagnosis not present

## 2016-02-12 DIAGNOSIS — J069 Acute upper respiratory infection, unspecified: Secondary | ICD-10-CM | POA: Diagnosis not present

## 2016-02-19 ENCOUNTER — Emergency Department: Payer: Medicare Other

## 2016-02-19 ENCOUNTER — Ambulatory Visit (INDEPENDENT_AMBULATORY_CARE_PROVIDER_SITE_OTHER): Payer: Medicare Other | Admitting: Family Medicine

## 2016-02-19 ENCOUNTER — Emergency Department
Admission: EM | Admit: 2016-02-19 | Discharge: 2016-02-20 | Disposition: A | Discharge status: Home / Self Care | Payer: Medicare Other | Attending: Emergency Medicine | Admitting: Emergency Medicine

## 2016-02-19 ENCOUNTER — Encounter: Payer: Self-pay | Admitting: Family Medicine

## 2016-02-19 ENCOUNTER — Ambulatory Visit
Admission: RE | Admit: 2016-02-19 | Discharge: 2016-02-19 | Disposition: A | Discharge status: Home / Self Care | Payer: Medicare Other | Source: Ambulatory Visit | Attending: Family Medicine | Admitting: Family Medicine

## 2016-02-19 VITALS — BP 122/40 | HR 92 | Temp 98.6°F | Resp 17 | Ht 63.0 in | Wt 238.4 lb

## 2016-02-19 DIAGNOSIS — J449 Chronic obstructive pulmonary disease, unspecified: Secondary | ICD-10-CM | POA: Diagnosis not present

## 2016-02-19 DIAGNOSIS — Z7984 Long term (current) use of oral hypoglycemic drugs: Secondary | ICD-10-CM | POA: Diagnosis not present

## 2016-02-19 DIAGNOSIS — Z79899 Other long term (current) drug therapy: Secondary | ICD-10-CM | POA: Diagnosis not present

## 2016-02-19 DIAGNOSIS — F329 Major depressive disorder, single episode, unspecified: Secondary | ICD-10-CM | POA: Diagnosis not present

## 2016-02-19 DIAGNOSIS — Z87891 Personal history of nicotine dependence: Secondary | ICD-10-CM | POA: Diagnosis not present

## 2016-02-19 DIAGNOSIS — J029 Acute pharyngitis, unspecified: Secondary | ICD-10-CM | POA: Diagnosis not present

## 2016-02-19 DIAGNOSIS — R059 Cough, unspecified: Secondary | ICD-10-CM

## 2016-02-19 DIAGNOSIS — E669 Obesity, unspecified: Secondary | ICD-10-CM | POA: Diagnosis not present

## 2016-02-19 DIAGNOSIS — E785 Hyperlipidemia, unspecified: Secondary | ICD-10-CM | POA: Diagnosis not present

## 2016-02-19 DIAGNOSIS — R05 Cough: Secondary | ICD-10-CM

## 2016-02-19 DIAGNOSIS — E119 Type 2 diabetes mellitus without complications: Secondary | ICD-10-CM | POA: Insufficient documentation

## 2016-02-19 DIAGNOSIS — R918 Other nonspecific abnormal finding of lung field: Secondary | ICD-10-CM

## 2016-02-19 DIAGNOSIS — M1990 Unspecified osteoarthritis, unspecified site: Secondary | ICD-10-CM | POA: Diagnosis not present

## 2016-02-19 DIAGNOSIS — D72819 Decreased white blood cell count, unspecified: Secondary | ICD-10-CM | POA: Diagnosis not present

## 2016-02-19 DIAGNOSIS — I2510 Atherosclerotic heart disease of native coronary artery without angina pectoris: Secondary | ICD-10-CM | POA: Diagnosis not present

## 2016-02-19 DIAGNOSIS — I959 Hypotension, unspecified: Secondary | ICD-10-CM

## 2016-02-19 DIAGNOSIS — R531 Weakness: Secondary | ICD-10-CM | POA: Diagnosis not present

## 2016-02-19 LAB — CBC
HCT: 26.2 % — ABNORMAL LOW (ref 35.0–47.0)
HEMOGLOBIN: 8.8 g/dL — AB (ref 12.0–16.0)
MCH: 34.3 pg — AB (ref 26.0–34.0)
MCHC: 33.8 g/dL (ref 32.0–36.0)
MCV: 101.6 fL — AB (ref 80.0–100.0)
PLATELETS: 102 10*3/uL — AB (ref 150–440)
RBC: 2.57 MIL/uL — AB (ref 3.80–5.20)
RDW: 15.3 % — ABNORMAL HIGH (ref 11.5–14.5)
WBC: 2.1 10*3/uL — AB (ref 3.6–11.0)

## 2016-02-19 LAB — POCT RAPID STREP A: Streptococcus, Group A Screen (Direct): NEGATIVE

## 2016-02-19 LAB — URINALYSIS COMPLETE WITH MICROSCOPIC (ARMC ONLY)
BILIRUBIN URINE: NEGATIVE
GLUCOSE, UA: NEGATIVE mg/dL
Ketones, ur: NEGATIVE mg/dL
Leukocytes, UA: NEGATIVE
Nitrite: NEGATIVE
PH: 6 (ref 5.0–8.0)
Protein, ur: NEGATIVE mg/dL
SQUAMOUS EPITHELIAL / LPF: NONE SEEN
Specific Gravity, Urine: 1.003 — ABNORMAL LOW (ref 1.005–1.030)

## 2016-02-19 LAB — CBC WITH DIFFERENTIAL/PLATELET
Basophils Absolute: 0 10*3/uL (ref 0–0.1)
Basophils Relative: 1 %
Eosinophils Absolute: 0 10*3/uL (ref 0–0.7)
HCT: 26 % — ABNORMAL LOW (ref 35.0–47.0)
Hemoglobin: 8.9 g/dL — ABNORMAL LOW (ref 12.0–16.0)
LYMPHS ABS: 1.2 10*3/uL (ref 1.0–3.6)
MCH: 34.6 pg — AB (ref 26.0–34.0)
MCHC: 34.2 g/dL (ref 32.0–36.0)
MCV: 101.3 fL — AB (ref 80.0–100.0)
MONO ABS: 0.1 10*3/uL — AB (ref 0.2–0.9)
Neutro Abs: 0.8 10*3/uL — ABNORMAL LOW (ref 1.4–6.5)
Neutrophils Relative %: 37 %
PLATELETS: 78 10*3/uL — AB (ref 150–440)
RBC: 2.57 MIL/uL — ABNORMAL LOW (ref 3.80–5.20)
RDW: 14.9 % — AB (ref 11.5–14.5)
WBC: 2.2 10*3/uL — ABNORMAL LOW (ref 3.6–11.0)

## 2016-02-19 LAB — TROPONIN I

## 2016-02-19 LAB — BASIC METABOLIC PANEL
ANION GAP: 9 (ref 5–15)
BUN: 23 mg/dL — AB (ref 6–20)
CHLORIDE: 101 mmol/L (ref 101–111)
CO2: 28 mmol/L (ref 22–32)
Calcium: 9.7 mg/dL (ref 8.9–10.3)
Creatinine, Ser: 1.14 mg/dL — ABNORMAL HIGH (ref 0.44–1.00)
GFR calc Af Amer: 54 mL/min — ABNORMAL LOW (ref >60)
GFR, EST NON AFRICAN AMERICAN: 46 mL/min — AB (ref >60)
GLUCOSE: 119 mg/dL — AB (ref 65–99)
POTASSIUM: 4 mmol/L (ref 3.5–5.1)
Sodium: 138 mmol/L (ref 135–145)

## 2016-02-19 MED ORDER — SODIUM CHLORIDE 0.9 % IV BOLUS (SEPSIS)
1000.0000 mL | Freq: Once | INTRAVENOUS | Status: AC
  Administered 2016-02-19: 1000 mL via INTRAVENOUS

## 2016-02-19 MED ORDER — DOXYCYCLINE HYCLATE 100 MG PO CAPS: 100.0000 mg | ORAL_CAPSULE | Freq: Two times a day (BID) | ORAL | Status: DC

## 2016-02-19 MED ORDER — DOXYCYCLINE HYCLATE 100 MG PO TABS
100.0000 mg | ORAL_TABLET | Freq: Once | ORAL | Status: AC
  Administered 2016-02-19: 100 mg via ORAL
  Filled 2016-02-19: qty 1

## 2016-02-19 MED ORDER — AZITHROMYCIN 250 MG PO TABS: ORAL_TABLET | ORAL | Status: DC

## 2016-02-19 NOTE — ED Provider Notes (Signed)
[facility] Emergency Department Provider Note  ____________________________________________  Time seen: Approximately 7:51 PM  I have reviewed the triage vital signs and the nursing notes.   HISTORY  Chief Complaint Cough and Nasal Congestion    HPI Teresa Carrillo is a 75 y.o. female who presents for evaluation of a cough and sore throat. The patient is experiencing a dry cough and also a sore throat she describes as a raw feeling in the back for the last 3 weeks. She saw her doctor was initially treated with azithromycin, and reports no relief and then treated again with additional antibiotic which she is halfway through. She denies any fevers or chills. No nausea or vomiting.  No rash. No chest pain. No shortness of breath. Overall she reports she feels okay, but just cannot shake this persistent cough and sore throat. She is able to swallow. She denies any pain in the neck. No headache or neck stiffness. No muscle aches.  Patient denies any tick bites. Does report she had Lyme disease in [place] spotted fever about 2 years ago.  Past Medical History  Diagnosis Date  . Diabetes mellitus without complication (HCC)     elevated A1c  . Arthritis   . Heart murmur   . Depression   . Hyperlipidemia   . Sleep apnea   . GERD (gastroesophageal reflux disease)   . Anginal pain ([place])   . Elevated serum creatinine   . Urinary frequency   . HTN (hypertension)   . Fibrocystic breast disease   . Anxiety   . Hearing loss   . Dyspnea on exertion   . Risk for falls   . Cystitis   . Thrombocytopenia ([place])   . Urinary urgency   . Abdominal wall mass   . Calculus of kidney   . Obesity   . Microscopic hematuria     Patient Active Problem List   Diagnosis Date Noted  . Cough 02/19/2016  . Hypotension 02/19/2016  . Proteinuria 05/10/2015  . Microscopic hematuria 05/08/2015  . Renal stone 05/08/2015  . Hyperlipidemia 05/05/2015  . HTN (hypertension)  05/05/2015  . Arteriosclerosis of coronary artery 02/02/2015  . H/O elevated lipids 02/02/2015  . H/O: HTN (hypertension) 02/02/2015  . Diabetes mellitus, type 2 ([place]) 02/02/2015  . COPD, mild ([place]) 05/02/2014  . Breathlessness on exertion 05/02/2014  . Abnormal respiratory rate 05/02/2014    Past Surgical History  Procedure Laterality Date  . Cholecystectomy    . Abdominal hysterectomy    . Appendectomy    . Cardiac catheterization      x2  . Diagnostic laparoscopy      Removal of benign abdominal tumor  . Right eye surgery Right   . Colonoscopy N/A 02/24/2015    Procedure: COLONOSCOPY;  Surgeon: Manya Silvas, MD;  Location: [facility] ENDOSCOPY;  Service: Endoscopy;  Laterality: N/A;  . Esophagogastroduodenoscopy N/A 02/24/2015    Procedure: ESOPHAGOGASTRODUODENOSCOPY (EGD);  Surgeon: Manya Silvas, MD;  Location: [facility] ENDOSCOPY;  Service: Endoscopy;  Laterality: N/A;    Current Outpatient Rx  Name  Route  Sig  Dispense  Refill  . albuterol (PROAIR HFA) 108 (90 Base) MCG/ACT inhaler   Inhalation   Inhale into the lungs.         Marland Kitchen azithromycin (ZITHROMAX) 250 MG tablet      2 tabs po day 1, then 1 tab po q day x4 days.   6 each   0   . bisoprolol-hydrochlorothiazide (ZIAC) 5-6.25  MG tablet      TAKE 1 TABLET BY MOUTH DAILY   90 tablet   0   . buPROPion (WELLBUTRIN XL) 150 MG 24 hr tablet   Oral   Take 300 mg by mouth daily.         . clonazePAM (KLONOPIN) 0.5 MG tablet   Oral   Take 0.5 mg by mouth 2 (two) times daily.          Marland Kitchen doxycycline (VIBRAMYCIN) 100 MG capsule   Oral   Take 1 capsule (100 mg total) by mouth 2 (two) times daily.   20 capsule   0   . lisinopril (PRINIVIL,ZESTRIL) 5 MG tablet      TAKE 1 TABLET BY MOUTH DAILY   90 tablet   0   . LUMIGAN 0.01 % SOLN                 Dispense as written.   . metFORMIN (GLUCOPHAGE) 500 MG tablet      TAKE 1 TABLET BY MOUTH DAILY   30 tablet   0   . metFORMIN (GLUCOPHAGE) 500 MG  tablet      TAKE 1 TABLET BY MOUTH DAILY   90 tablet   0     **Patient requests 90 days supply**   . Multiple Vitamin (MULTIVITAMIN WITH MINERALS) TABS tablet   Oral   Take 1 tablet by mouth daily.         . Omega-3 Fatty Acids (FISH OIL) 1000 MG CAPS   Oral   Take 1 capsule by mouth daily.         Marland Kitchen omeprazole (PRILOSEC) 40 MG capsule               . ONE TOUCH ULTRA TEST test strip                 Dispense as written.   Marland Kitchen PROAIR HFA 108 (90 BASE) MCG/ACT inhaler      INHALE 2 INHALATIONS INTO THE LUNGS Q 6 H PRN FOR WHEEZING      0     Dispense as written.   . rosuvastatin (CRESTOR) 20 MG tablet   Oral   Take 1 tablet (20 mg total) by mouth daily.   90 tablet   0   . sertraline (ZOLOFT) 100 MG tablet   Oral   Take 100 mg by mouth daily.          Marland Kitchen Umeclidinium-Vilanterol (ANORO ELLIPTA) 62.5-25 MCG/INH AEPB   Inhalation   Inhale 1 puff into the lungs daily as needed.           Allergies Macrobid  Family History  Problem Relation Age of Onset  . Congestive Heart Failure Mother   . Cirrhosis Father   . Diabetes Mother   . Coronary artery disease Mother   . Stroke Mother     Social History Social History  Substance Use Topics  . Smoking status: Former Smoker    Quit date: 09/09/1988  . Smokeless tobacco: Never Used     Comment: quit 25 years ago  . Alcohol Use: No    Review of Systems Constitutional: No fever/chills Eyes: No visual changes. ENT: No sore throat. Cardiovascular: Denies chest pain. Respiratory: See history of present illness Gastrointestinal: No abdominal pain.  No nausea, no vomiting.  No diarrhea.  No constipation. Genitourinary: Negative for dysuria. Musculoskeletal: Negative for back pain. Skin: Negative for rash. Neurological: Negative for headaches, focal weakness or numbness.  10-point ROS otherwise negative.  ____________________________________________   PHYSICAL EXAM:  VITAL SIGNS: ED Triage  Vitals  Enc Vitals Group     BP 02/19/16 1539 157/64 mmHg     Pulse Rate 02/19/16 1539 65     Resp 02/19/16 1539 17     Temp 02/19/16 1539 98.3 F (36.8 C)     Temp Source 02/19/16 1539 Oral     SpO2 02/19/16 1539 99 %     Weight 02/19/16 1539 238 lb (107.956 kg)     Height 02/19/16 1539 5\' 3"  (1.6 m)     Head Cir --      Peak Flow --      Pain Score 02/19/16 1540 8     Pain Loc --      Pain Edu? --      Excl. in [place]? --    Constitutional: Alert and oriented. Well appearing and in no acute distress. Eyes: Conjunctivae are normal. PERRL. EOMI. Head: Atraumatic. Nose: No congestion/rhinnorhea. Mouth/Throat: Mucous membranes are moist.  Oropharynx Shows minimal injection, no evidence of tonsillar hypertrophy or exudates. No meningismus. No tenderness to the anterior neck. She swallows normally. No stridor. No evidence of mass. Neck: No stridor.   Cardiovascular: Normal rate, regular rhythm. Grossly normal heart sounds.  Good peripheral circulation. Respiratory: Normal respiratory effort.  No retractions. Lungs CTAB. Speaks in full and clear sentences with normal oxygen saturation. She does have an occasional dry nonproductive cough. Gastrointestinal: Soft and nontender. No distention.  No CVA tenderness. Musculoskeletal: No lower extremity tenderness nor edema.  No joint effusions. Neurologic:  Normal speech and language. No gross focal neurologic deficits are appreciated. Skin:  Skin is warm, dry and intact. No rash noted. Psychiatric: Mood and affect are normal. Speech and behavior are normal.  ____________________________________________   LABS (all labs ordered are listed, but only abnormal results are displayed)  Labs Reviewed  CBC - Abnormal; Notable for the following:    WBC 2.1 (*)    RBC 2.57 (*)    Hemoglobin 8.8 (*)    HCT 26.2 (*)    MCV 101.6 (*)    MCH 34.3 (*)    RDW 15.3 (*)    Platelets 102 (*)    All other components within normal limits  BASIC METABOLIC  PANEL - Abnormal; Notable for the following:    Glucose, Bld 119 (*)    BUN 23 (*)    Creatinine, Ser 1.14 (*)    GFR calc non Af Amer 46 (*)    GFR calc Af Amer 54 (*)    All other components within normal limits  CBC WITH DIFFERENTIAL/PLATELET - Abnormal; Notable for the following:    WBC 2.2 (*)    RBC 2.57 (*)    Hemoglobin 8.9 (*)    HCT 26.0 (*)    MCV 101.3 (*)    MCH 34.6 (*)    RDW 14.9 (*)    Platelets 78 (*)    Neutro Abs 0.8 (*)    Monocytes Absolute 0.1 (*)    All other components within normal limits  URINALYSIS COMPLETEWITH MICROSCOPIC ([facility] ONLY) - Abnormal; Notable for the following:    Color, Urine STRAW (*)    APPearance CLEAR (*)    Specific Gravity, Urine 1.003 (*)    Hgb urine dipstick 2+ (*)    Bacteria, UA RARE (*)    All other components within normal limits  CULTURE, BLOOD (ROUTINE X 2)  CULTURE, BLOOD (ROUTINE X 2)  CULTURE, GROUP A STREP ([place])  TROPONIN I  ROCKY MTN SPOTTED FVR ABS PNL(IGG+IGM)  POCT RAPID STREP A   ____________________________________________  EKG   ____________________________________________  RADIOLOGY  DG Chest 2 View (Final result) Result time: 02/19/16 22:43:25   Final result by Rad Results In Interface (02/19/16 22:43:25)   Narrative:   CLINICAL DATA: Sore throat, ear and sinus congestion, and cough for 3 weeks. Seen by primary physician 2 weeks ago and placed on antibiotics and cocaine without relief. Went to a walk-in clinic last week and given antibiotics. Still no improvement. 12 pound weight loss over 3 weeks.  EXAM: CHEST 2 VIEW  COMPARISON: 02/19/2016  FINDINGS: Normal heart size and pulmonary vascularity. Slight linear infiltrates in the lower lungs bilaterally likely representing fibrosis or atelectasis. No discrete consolidation. No blunting of costophrenic angles. No pneumothorax. Mediastinal contours appear intact. Degenerative changes in the spine and shoulders. Surgical clips in the  right upper quadrant. Calcification of the aorta.  IMPRESSION: Slight linear atelectasis or fibrosis in the lower lungs. No focal consolidation.   Electronically Signed By: Lucienne Capers M.D. On: 02/19/2016 22:43       ____________________________________________   PROCEDURES  Procedure(s) performed: None  Critical Care performed: No  ____________________________________________   INITIAL IMPRESSION / ASSESSMENT AND PLAN / ED COURSE  Pertinent labs & imaging results that were available during my care of the patient were reviewed by me and considered in my medical decision making (see chart for details).  Patient has persistent nonproductive cough for about 3 weeks and a moderate sore throat. She does have evidence of slight pharyngeal injection, but otherwise clear lungs and a very reassuring physical exam. History is somewhat interesting and the persistence of her symptoms, in reviewing her labs she has new onset of leukopenia. In addition her hemoglobin is slightly low and her platelet flow though she does carry a history of thrombocytopenia. She denies any history of active cancer, and she denies any obvious exposure to new or acute infectious illness such as travel, tick bites.  Based on the patient's labs and chief complaint I discussed her presentation with Dr. Ola Spurr of infectious disease. He advises that it would seem appropriate to send blood cultures, place the patient on doxycycline the event of atypical or tick borne infection, and he can follow-up with her in the office on Friday. He will plan to repeat her labs, reevaluate her symptoms, and now for further advice that time. Based on her current hemodynamic she appears appropriate for outpatient treatment. She is very stable, afebrile in no distress. I also discussed with the patient of her symptoms continue that she likely should have follow-up with ear nose and throat doctor and she is also in agreement.  Patient and her friend who is with her are both agreeable to plan to follow up with her primary care doctor the next 1-2 days for recheck, and then also see Dr. Ola Spurr in the clinic on Friday. We will place her on doxycycline area  The patient is very agreeable with this plan, awake alert in no distress. Very careful return precautions were discussed with the patient who is agreeable. ____________________________________________   FINAL CLINICAL IMPRESSION(S) / ED DIAGNOSES  Final diagnoses:  Leukopenia  Cough  Pharyngitis      Delman Kitten, MD 02/20/16 0007

## 2016-02-19 NOTE — ED Notes (Signed)
Blood culture draw #1 from LEFT AC.

## 2016-02-19 NOTE — Progress Notes (Signed)
Name: Teresa Carrillo   MRN: [id number]    DOB: [birth year]   Date:02/19/2016       Progress Note  Subjective  Chief Complaint  Chief Complaint  Patient presents with  . Sore Throat  . Fever    HPI  Sore throat: Pt. Reports a 3 weeks history of sore throat, ear and sinus congestion and a recurrent dry cough.  She was seen at Dr. Etta Quill office 2 weeks ago and was prescribed Z-pack and Codeine which did not help relieve her symptoms. She then went to the [facility] last week and was told that she had a 'respiratory infection' and was prescribed Cefdinir. She reports after taking the antibiotic for a week, she still hasn't felt better. Has lost 12 lbs in the last 3 weeks and feel like she has no energy at all.   Past Medical History  Diagnosis Date  . Diabetes mellitus without complication (HCC)     elevated A1c  . Arthritis   . Heart murmur   . Depression   . Hyperlipidemia   . Sleep apnea   . GERD (gastroesophageal reflux disease)   . Anginal pain ([place])   . Elevated serum creatinine   . Urinary frequency   . HTN (hypertension)   . Fibrocystic breast disease   . Anxiety   . Hearing loss   . Dyspnea on exertion   . Risk for falls   . Cystitis   . Thrombocytopenia ([place])   . Urinary urgency   . Abdominal wall mass   . Calculus of kidney   . Obesity   . Microscopic hematuria     Past Surgical History  Procedure Laterality Date  . Cholecystectomy    . Abdominal hysterectomy    . Appendectomy    . Cardiac catheterization      x2  . Diagnostic laparoscopy      Removal of benign abdominal tumor  . Right eye surgery Right   . Colonoscopy N/A 02/24/2015    Procedure: COLONOSCOPY;  Surgeon: Manya Silvas, MD;  Location: [facility] ENDOSCOPY;  Service: Endoscopy;  Laterality: N/A;  . Esophagogastroduodenoscopy N/A 02/24/2015    Procedure: ESOPHAGOGASTRODUODENOSCOPY (EGD);  Surgeon: Manya Silvas, MD;  Location: [facility] ENDOSCOPY;  Service: Endoscopy;  Laterality: N/A;     Family History  Problem Relation Age of Onset  . Congestive Heart Failure Mother   . Cirrhosis Father   . Diabetes Mother   . Coronary artery disease Mother   . Stroke Mother     Social History   Social History  . Marital Status: Married    Spouse Name: N/A  . Number of Children: N/A  . Years of Education: N/A   Occupational History  . Not on file.   Social History Main Topics  . Smoking status: Former Smoker    Quit date: 09/09/1988  . Smokeless tobacco: Never Used     Comment: quit 25 years ago  . Alcohol Use: No  . Drug Use: No  . Sexual Activity: Not on file   Other Topics Concern  . Not on file   Social History Narrative     Current outpatient prescriptions:  .  albuterol (PROAIR HFA) 108 (90 Base) MCG/ACT inhaler, Inhale into the lungs., Disp: , Rfl:  .  bisoprolol-hydrochlorothiazide (ZIAC) 5-6.25 MG tablet, TAKE 1 TABLET BY MOUTH DAILY, Disp: 90 tablet, Rfl: 0 .  buPROPion (WELLBUTRIN XL) 150 MG 24 hr tablet, Take 300 mg by mouth daily., Disp: , Rfl:  .  clonazePAM (KLONOPIN) 0.5 MG tablet, Take 0.5 mg by mouth 2 (two) times daily. , Disp: , Rfl:  .  lisinopril (PRINIVIL,ZESTRIL) 5 MG tablet, TAKE 1 TABLET BY MOUTH DAILY, Disp: 90 tablet, Rfl: 0 .  LUMIGAN 0.01 % SOLN, , Disp: , Rfl:  .  metFORMIN (GLUCOPHAGE) 500 MG tablet, TAKE 1 TABLET BY MOUTH DAILY, Disp: 30 tablet, Rfl: 0 .  metFORMIN (GLUCOPHAGE) 500 MG tablet, TAKE 1 TABLET BY MOUTH DAILY, Disp: 90 tablet, Rfl: 0 .  Multiple Vitamin (MULTIVITAMIN WITH MINERALS) TABS tablet, Take 1 tablet by mouth daily., Disp: , Rfl:  .  Omega-3 Fatty Acids (FISH OIL) 1000 MG CAPS, Take 1 capsule by mouth daily., Disp: , Rfl:  .  omeprazole (PRILOSEC) 40 MG capsule, , Disp: , Rfl:  .  ONE TOUCH ULTRA TEST test strip, , Disp: , Rfl:  .  PROAIR HFA 108 (90 BASE) MCG/ACT inhaler, INHALE 2 INHALATIONS INTO THE LUNGS Q 6 H PRN FOR WHEEZING, Disp: , Rfl: 0 .  rosuvastatin (CRESTOR) 20 MG tablet, Take 1 tablet (20 mg  total) by mouth daily., Disp: 90 tablet, Rfl: 0 .  sertraline (ZOLOFT) 100 MG tablet, Take 100 mg by mouth daily. , Disp: , Rfl:  .  Umeclidinium-Vilanterol (ANORO ELLIPTA) 62.5-25 MCG/INH AEPB, Inhale 1 puff into the lungs daily as needed., Disp: , Rfl:   Allergies  Allergen Reactions  . Macrobid [employer] Macro]      Review of Systems  Constitutional: Positive for fever and weight loss. Negative for chills.  Respiratory: Positive for cough. Negative for hemoptysis, sputum production and shortness of breath.   Cardiovascular: Negative for chest pain.     Objective  Filed Vitals:   02/19/16 1144  BP: 122/40  Pulse: 92  Temp: 98.6 F (37 C)  TempSrc: Oral  Resp: 17  Height: 5\' 3"  (1.6 m)  Weight: 238 lb 6.4 oz (108.138 kg)  SpO2: 96%    Physical Exam  Constitutional: She is oriented to person, place, and time and well-developed, well-nourished, and in no distress.  HENT:  Head: Normocephalic and atraumatic.  Right Ear: Tympanic membrane and ear canal normal.  Left Ear: Tympanic membrane and ear canal normal.  Nose: Right sinus exhibits no maxillary sinus tenderness and no frontal sinus tenderness. Left sinus exhibits no maxillary sinus tenderness and no frontal sinus tenderness.  Mouth/Throat: No posterior oropharyngeal erythema.  Normal TM and canal but intense pain on otoscopic examination.  Cardiovascular: Normal rate, regular rhythm and normal heart sounds.   No murmur heard. Pulmonary/Chest: Effort normal. She has wheezes. She has rales.  Fine expiratory wheezing on left side upon auscultation. Crackles on the left posterior side on auscultation.   Neurological: She is alert and oriented to person, place, and time.  Nursing note and vitals reviewed.    Assessment & Plan  1. Cough Chest x-ray was ordered, which was read by radiology as negative for any infiltrate. We'll start on a second course of Zithromax and for resolution of symptoms - DG  Chest 2 View; Future - azithromycin (ZITHROMAX) 250 MG tablet; 2 tabs po day 1, then 1 tab po q day x4 days.  Dispense: 6 each; Refill: 0  2. Hypotension, unspecified hypotension type [place] has diastolic blood pressure into the 40s on multiple manual repeats, reports this is new for her and she is worried about low blood pressure, normal blood pressure is usually in the 70s to 80s and she is on antihypertensive medications. Patient feels  extremely weak and fatigued. We will refer to ER for workup and treatment including IV fluids. Case discussed with charge nurse at [facility] ER. Patient will proceed via EMS.   Navin Dogan [facility] 02/19/2016 11:55 AM

## 2016-02-19 NOTE — ED Notes (Addendum)
Pt comes into the ED via EMS from PCP office after having negative test and refused to leave without treatment.. Pt c/o cough with congestion and sinus drainage for the past 3 weeks, and stopped taking the second abx on the 7th out of 10 days,pt states it wasn't helping so I stopped taking it..pt is in NAD.Marland Kitchen

## 2016-02-19 NOTE — ED Notes (Signed)
Blood culture draw #2 from RIGHT hand. 

## 2016-02-19 NOTE — Discharge Instructions (Signed)
You have been seen in the Emergency Department (ED) today for a likely viral or other infectious illness.  Please drink plenty of clear fluids (water, Gatorade, chicken broth, etc).    Please follow up with your doctor in 1 to 2 days and Dr. Ola Spurr on this [place].  Call your doctor or return to the Emergency Department (ED) if you are unable to tolerate fluids due to vomiting, have trouble breathing, become extremely tired or difficult to awaken, or if you develop any other symptoms that concern you.   Cough, Adult Coughing is a reflex that clears your throat and your airways. Coughing helps to heal and protect your lungs. It is normal to cough occasionally, but a cough that happens with other symptoms or lasts a long time may be a sign of a condition that needs treatment. A cough may last only 2-3 weeks (acute), or it may last longer than 8 weeks (chronic). CAUSES Coughing is commonly caused by:  Breathing in substances that irritate your lungs.  A viral or bacterial respiratory infection.  Allergies.  Asthma.  Postnasal drip.  Smoking.  Acid backing up from the stomach into the esophagus (gastroesophageal reflux).  Certain medicines.  Chronic lung problems, including COPD (or rarely, lung cancer).  Other medical conditions such as heart failure. HOME CARE INSTRUCTIONS  Pay attention to any changes in your symptoms. Take these actions to help with your discomfort:  Take medicines only as told by your health care provider.  If you were prescribed an antibiotic medicine, take it as told by your health care provider. Do not stop taking the antibiotic even if you start to feel better.  Talk with your health care provider before you take a cough suppressant medicine.  Drink enough fluid to keep your urine clear or pale yellow.  If the air is dry, use a cold steam vaporizer or humidifier in your bedroom or your home to help loosen secretions.  Avoid anything that causes  you to cough at work or at home.  If your cough is worse at night, try sleeping in a semi-upright position.  Avoid cigarette smoke. If you smoke, quit smoking. If you need help quitting, ask your health care provider.  Avoid caffeine.  Avoid alcohol.  Rest as needed. SEEK MEDICAL CARE IF:   You have new symptoms.  You cough up pus.  Your cough does not get better after 2-3 weeks, or your cough gets worse.  You cannot control your cough with suppressant medicines and you are losing sleep.  You develop pain that is getting worse or pain that is not controlled with pain medicines.  You have a fever.  You have unexplained weight loss.  You have night sweats. SEEK IMMEDIATE MEDICAL CARE IF:  You cough up blood.  You have difficulty breathing.  Your heartbeat is very fast.   This information is not intended to replace advice given to you by your health care provider. Make sure you discuss any questions you have with your health care provider.   Document Released: 02/22/2011 Document Revised: 05/17/2015 Document Reviewed: 11/02/2014 Elsevier Interactive Patient Education [employer].

## 2016-02-20 ENCOUNTER — Other Ambulatory Visit: Payer: Self-pay | Admitting: Family Medicine

## 2016-02-20 MED ORDER — ONDANSETRON 4 MG PO TBDP
ORAL_TABLET | ORAL | Status: AC
  Administered 2016-02-20: 4 mg via ORAL
  Filled 2016-02-20: qty 1

## 2016-02-20 MED ORDER — ONDANSETRON 4 MG PO TBDP
4.0000 mg | ORAL_TABLET | Freq: Once | ORAL | Status: AC
  Administered 2016-02-20: 4 mg via ORAL

## 2016-02-20 NOTE — ED Notes (Signed)
Pt with active nausea and retching when attempting to get dressed for d/c following Doxycycline PO on an empty stomach. Dr Dahlia Client notified, with verbal orders received for 4mg  Zofran ODT to be entered and carried out by this RN.

## 2016-02-21 ENCOUNTER — Ambulatory Visit (INDEPENDENT_AMBULATORY_CARE_PROVIDER_SITE_OTHER): Payer: Medicare Other | Admitting: Family Medicine

## 2016-02-21 ENCOUNTER — Encounter: Payer: Self-pay | Admitting: Family Medicine

## 2016-02-21 VITALS — BP 128/70 | HR 92 | Temp 98.1°F | Resp 16 | Ht 63.0 in | Wt 239.8 lb

## 2016-02-21 DIAGNOSIS — D61818 Other pancytopenia: Secondary | ICD-10-CM

## 2016-02-21 DIAGNOSIS — B882 Other arthropod infestations: Secondary | ICD-10-CM | POA: Diagnosis not present

## 2016-02-21 DIAGNOSIS — D649 Anemia, unspecified: Secondary | ICD-10-CM | POA: Diagnosis not present

## 2016-02-21 DIAGNOSIS — E785 Hyperlipidemia, unspecified: Secondary | ICD-10-CM

## 2016-02-21 MED ORDER — ROSUVASTATIN CALCIUM 20 MG PO TABS: 20.0000 mg | ORAL_TABLET | Freq: Every day | ORAL | Status: DC

## 2016-02-21 NOTE — Progress Notes (Signed)
Name: Teresa Carrillo   MRN: [id number]    DOB: 1941-05-04   Date:02/21/2016       Progress Note  Subjective  Chief Complaint  Chief Complaint  Patient presents with  . Insect Bite    ER Follow up Tick Bite    Hyperlipidemia This is a chronic problem. The problem is controlled. Recent lipid tests were reviewed and are normal. Pertinent negatives include no chest pain, leg pain, myalgias or shortness of breath. Current antihyperlipidemic treatment includes statins. The current treatment provides significant improvement of lipids. There are no compliance problems.     Pt. Presents for ER follow up. She was sent over to [facility] ER on Monday, June 12th, 2017 for complaints of extreme fatigue, weakness, and a low Diastolic Blood Pressure (into the 40s). In the ER, she was evaluated and lab work was obtained. This includes the following: CBC: low white count, hemoglobin, hematocrit, and platelets.  CMP: Elevated BUN, creatinine, and decreased GFR. Blood cultures were obtained and are pending. Titers for tick-borne illnesses were sent over and are pending. Pt. Was started on Doxycycline after consultation with Infectious Disease physician and she is supposed to follow up with Dr. Ola Spurr on Friday, June 16 th, 2017. Pt.'s has Anemia, has noticed black colored stools intermittently, on review of Endoscopy from June 2016, showed superficial non-bleeding gastric ulcer.  Hospital records including ER physician's note reviewed and discussed with patient in detail  Past Medical History  Diagnosis Date  . Diabetes mellitus without complication (HCC)     elevated A1c  . Arthritis   . Heart murmur   . Depression   . Hyperlipidemia   . Sleep apnea   . GERD (gastroesophageal reflux disease)   . Anginal pain ([place])   . Elevated serum creatinine   . Urinary frequency   . HTN (hypertension)   . Fibrocystic breast disease   . Anxiety   . Hearing loss   . Dyspnea on exertion   . Risk for falls    . Cystitis   . Thrombocytopenia ([place])   . Urinary urgency   . Abdominal wall mass   . Calculus of kidney   . Obesity   . Microscopic hematuria     Past Surgical History  Procedure Laterality Date  . Cholecystectomy    . Abdominal hysterectomy    . Appendectomy    . Cardiac catheterization      x2  . Diagnostic laparoscopy      Removal of benign abdominal tumor  . Right eye surgery Right   . Colonoscopy N/A 02/24/2015    Procedure: COLONOSCOPY;  Surgeon: Manya Silvas, MD;  Location: [facility] ENDOSCOPY;  Service: Endoscopy;  Laterality: N/A;  . Esophagogastroduodenoscopy N/A 02/24/2015    Procedure: ESOPHAGOGASTRODUODENOSCOPY (EGD);  Surgeon: Manya Silvas, MD;  Location: [facility] ENDOSCOPY;  Service: Endoscopy;  Laterality: N/A;    Family History  Problem Relation Age of Onset  . Congestive Heart Failure Mother   . Cirrhosis Father   . Diabetes Mother   . Coronary artery disease Mother   . Stroke Mother     Social History   Social History  . Marital Status: Married    Spouse Name: N/A  . Number of Children: N/A  . Years of Education: N/A   Occupational History  . Not on file.   Social History Main Topics  . Smoking status: Former Smoker    Quit date: 09/09/1988  . Smokeless tobacco: Never Used  Comment: quit 25 years ago  . Alcohol Use: No  . Drug Use: No  . Sexual Activity: Not on file   Other Topics Concern  . Not on file   Social History Narrative     Current outpatient prescriptions:  .  albuterol (PROAIR HFA) 108 (90 Base) MCG/ACT inhaler, Inhale into the lungs., Disp: , Rfl:  .  azithromycin (ZITHROMAX) 250 MG tablet, 2 tabs po day 1, then 1 tab po q day x4 days., Disp: 6 each, Rfl: 0 .  bisoprolol-hydrochlorothiazide (ZIAC) 5-6.25 MG tablet, TAKE 1 TABLET BY MOUTH DAILY, Disp: 90 tablet, Rfl: 0 .  buPROPion (WELLBUTRIN XL) 150 MG 24 hr tablet, Take 300 mg by mouth daily., Disp: , Rfl:  .  clonazePAM (KLONOPIN) 0.5 MG tablet, Take 0.5 mg by  mouth 2 (two) times daily. , Disp: , Rfl:  .  doxycycline (VIBRAMYCIN) 100 MG capsule, Take 1 capsule (100 mg total) by mouth 2 (two) times daily., Disp: 20 capsule, Rfl: 0 .  lisinopril (PRINIVIL,ZESTRIL) 5 MG tablet, TAKE 1 TABLET BY MOUTH DAILY, Disp: 90 tablet, Rfl: 0 .  LUMIGAN 0.01 % SOLN, , Disp: , Rfl:  .  metFORMIN (GLUCOPHAGE) 500 MG tablet, TAKE 1 TABLET BY MOUTH DAILY, Disp: 30 tablet, Rfl: 0 .  metFORMIN (GLUCOPHAGE) 500 MG tablet, TAKE 1 TABLET BY MOUTH DAILY, Disp: 90 tablet, Rfl: 0 .  Multiple Vitamin (MULTIVITAMIN WITH MINERALS) TABS tablet, Take 1 tablet by mouth daily., Disp: , Rfl:  .  Omega-3 Fatty Acids (FISH OIL) 1000 MG CAPS, Take 1 capsule by mouth daily., Disp: , Rfl:  .  omeprazole (PRILOSEC) 40 MG capsule, , Disp: , Rfl:  .  ONE TOUCH ULTRA TEST test strip, , Disp: , Rfl:  .  PROAIR HFA 108 (90 BASE) MCG/ACT inhaler, INHALE 2 INHALATIONS INTO THE LUNGS Q 6 H PRN FOR WHEEZING, Disp: , Rfl: 0 .  rosuvastatin (CRESTOR) 20 MG tablet, Take 1 tablet (20 mg total) by mouth daily., Disp: 90 tablet, Rfl: 0 .  sertraline (ZOLOFT) 100 MG tablet, Take 100 mg by mouth daily. , Disp: , Rfl:  .  Umeclidinium-Vilanterol (ANORO ELLIPTA) 62.5-25 MCG/INH AEPB, Inhale 1 puff into the lungs daily as needed., Disp: , Rfl:   Allergies  Allergen Reactions  . Macrobid [employer] Macro]     Review of Systems  Constitutional: Positive for malaise/fatigue. Negative for fever and chills.  HENT: Positive for congestion and sore throat.   Respiratory: Positive for cough. Negative for hemoptysis and shortness of breath.   Cardiovascular: Positive for palpitations. Negative for chest pain.  Gastrointestinal: Positive for melena (sometimes experiences black colored stool, has history of non-bleeding ulcers from Endoscopy  3 months ago.). Negative for abdominal pain and blood in stool.  Genitourinary: Negative for hematuria.  Musculoskeletal: Negative for myalgias.     Objective  Filed Vitals:   02/21/16 1032  BP: 128/70  Pulse: 92  Temp: 98.1 F (36.7 C)  TempSrc: Oral  Resp: 16  Height: 5\' 3"  (1.6 m)  Weight: 239 lb 12.8 oz (108.773 kg)  SpO2: 96%    Physical Exam  Constitutional: She is well-developed, well-nourished, and in no distress.  Cardiovascular: Normal rate, regular rhythm and normal heart sounds.   No murmur heard. Pulmonary/Chest: Effort normal and breath sounds normal. She has no wheezes.  Abdominal: Soft. Bowel sounds are normal. There is no tenderness.  Genitourinary: Rectal exam shows mass. Rectal exam shows no tenderness. Guaiac negative stool.  Peri-anal mass  consistent with skin tag.  Nursing note and vitals reviewed.    Assessment & Plan  1. Anemia, unspecified anemia type Most likely from blood loss, obtain pertinent iron studies for evaluation - Ferritin - Iron Binding Cap (TIBC) - Transferrin - Retic - Iron - POCT Occult Blood Stool  2. Pancytopenia (HCC)  - CBC with Differential  3. Tick-borne disease Has been advised to follow-up with infectious disease, Dr. Ola Spurr. Referral to infectious disease provided - Ambulatory referral to Infectious Disease  4. Hyperlipidemia  - rosuvastatin (CRESTOR) 20 MG tablet; Take 1 tablet (20 mg total) by mouth daily.  Dispense: 90 tablet; Refill: 1    Mariellen Blaney Asad A. [facility] 02/21/2016 10:40 AM

## 2016-02-22 ENCOUNTER — Other Ambulatory Visit: Payer: Self-pay | Admitting: Family Medicine

## 2016-02-22 ENCOUNTER — Telehealth: Payer: Self-pay | Admitting: Emergency Medicine

## 2016-02-22 DIAGNOSIS — D61818 Other pancytopenia: Secondary | ICD-10-CM

## 2016-02-22 LAB — FERRITIN: Ferritin: 698 ng/mL — ABNORMAL HIGH (ref 15–150)

## 2016-02-22 LAB — IRON AND TIBC
Iron Saturation: 50 % (ref 15–55)
Iron: 133 ug/dL (ref 27–139)
Total Iron Binding Capacity: 265 ug/dL (ref 250–450)
UIBC: 132 ug/dL (ref 118–369)

## 2016-02-22 LAB — CULTURE, GROUP A STREP (THRC)

## 2016-02-22 LAB — CBC WITH DIFFERENTIAL/PLATELET
Basophils Absolute: 0 x10E3/uL (ref 0.0–0.2)
Basos: 0 %
EOS (ABSOLUTE): 0 x10E3/uL (ref 0.0–0.4)
Eos: 1 %
Hematocrit: 26.2 % — ABNORMAL LOW (ref 34.0–46.6)
Hemoglobin: 8.3 g/dL — ABNORMAL LOW (ref 11.1–15.9)
Immature Grans (Abs): 0 x10E3/uL (ref 0.0–0.1)
Immature Granulocytes: 2 %
Lymphocytes Absolute: 1.1 x10E3/uL (ref 0.7–3.1)
Lymphs: 61 %
MCH: 33.6 pg — ABNORMAL HIGH (ref 26.6–33.0)
MCHC: 31.7 g/dL (ref 31.5–35.7)
MCV: 106 fL — ABNORMAL HIGH (ref 79–97)
Monocytes Absolute: 0.1 x10E3/uL (ref 0.1–0.9)
Monocytes: 5 %
Neutrophils Absolute: 0.6 x10E3/uL — ABNORMAL LOW (ref 1.4–7.0)
Neutrophils: 31 %
RBC: 2.47 x10E6/uL — CL (ref 3.77–5.28)
RDW: 16.3 % — ABNORMAL HIGH (ref 12.3–15.4)
WBC: 1.8 x10E3/uL — CL (ref 3.4–10.8)

## 2016-02-22 LAB — ROCKY MTN SPOTTED FVR ABS PNL(IGG+IGM)
RMSF IGM: 4.4 {index} — AB (ref 0.00–0.89)
RMSF IgG: NEGATIVE

## 2016-02-22 LAB — TRANSFERRIN: Transferrin: 208 mg/dL (ref 200–370)

## 2016-02-22 LAB — RETICULOCYTES: Retic Ct Pct: 3.6 % — ABNORMAL HIGH (ref 0.6–2.6)

## 2016-02-22 NOTE — ED Notes (Signed)
Called patient to inform of rmsf results.  Left message with my number.

## 2016-02-23 ENCOUNTER — Telehealth: Payer: Self-pay | Admitting: Emergency Medicine

## 2016-02-23 NOTE — ED Notes (Signed)
Contacted patient to inform that rmsf testing is complete and IGM is elevated.  She has not gotten appt with ID, as she needs referral from pcp--saw pcp yesterday.  i told her to call dr Manuella Ghazi and tell him that the rmsf testing is complete, and ask if she needs to go to ID doctor.  She agrees.

## 2016-02-23 NOTE — Telephone Encounter (Signed)
All information put in Epic and [place] was notified to look ar referral and make appointment ASAP. Dr. Manuella Ghazi was notified to put in order STAT

## 2016-02-26 ENCOUNTER — Telehealth: Payer: Self-pay | Admitting: Family Medicine

## 2016-02-26 NOTE — Telephone Encounter (Signed)
PT CHECKING OF REFERRAL THAT WAS TO HAVE BEEN DONE TO [place] FOR COMPARING HER LABS TO DR Ola Spurr OFFICE ABOUT HER LABS. THEY WILL NEED OUR LAB RESULTS AND LAB RESULTS FROM ER AT [facility]. NEEDS TO BE ASAP AND PT IS ASKING FOR A CALL FROM YOU ON THE STATUS OF THIS

## 2016-02-27 ENCOUNTER — Other Ambulatory Visit: Payer: Self-pay | Admitting: Family Medicine

## 2016-02-27 LAB — CULTURE, BLOOD (ROUTINE X 2)
CULTURE: NO GROWTH
Culture: NO GROWTH

## 2016-02-29 NOTE — Telephone Encounter (Signed)
Requesting return call pertaining to referral.

## 2016-03-01 NOTE — Telephone Encounter (Signed)
Spoke with patient confirmed referrals have been placed

## 2016-03-03 DIAGNOSIS — N390 Urinary tract infection, site not specified: Secondary | ICD-10-CM | POA: Diagnosis not present

## 2016-03-14 DIAGNOSIS — J439 Emphysema, unspecified: Secondary | ICD-10-CM | POA: Diagnosis not present

## 2016-03-14 DIAGNOSIS — R0602 Shortness of breath: Secondary | ICD-10-CM | POA: Diagnosis not present

## 2016-03-15 DIAGNOSIS — H40033 Anatomical narrow angle, bilateral: Secondary | ICD-10-CM | POA: Diagnosis not present

## 2016-03-21 ENCOUNTER — Other Ambulatory Visit: Payer: Self-pay | Admitting: Family Medicine

## 2016-03-22 DIAGNOSIS — N183 Chronic kidney disease, stage 3 (moderate): Secondary | ICD-10-CM | POA: Diagnosis not present

## 2016-03-22 DIAGNOSIS — E785 Hyperlipidemia, unspecified: Secondary | ICD-10-CM | POA: Diagnosis not present

## 2016-03-22 DIAGNOSIS — E1122 Type 2 diabetes mellitus with diabetic chronic kidney disease: Secondary | ICD-10-CM | POA: Diagnosis not present

## 2016-03-22 DIAGNOSIS — I10 Essential (primary) hypertension: Secondary | ICD-10-CM | POA: Diagnosis not present

## 2016-03-22 DIAGNOSIS — R319 Hematuria, unspecified: Secondary | ICD-10-CM | POA: Diagnosis not present

## 2016-03-25 DIAGNOSIS — N2581 Secondary hyperparathyroidism of renal origin: Secondary | ICD-10-CM | POA: Diagnosis not present

## 2016-03-25 DIAGNOSIS — N183 Chronic kidney disease, stage 3 (moderate): Secondary | ICD-10-CM | POA: Diagnosis not present

## 2016-03-25 DIAGNOSIS — E1122 Type 2 diabetes mellitus with diabetic chronic kidney disease: Secondary | ICD-10-CM | POA: Diagnosis not present

## 2016-03-25 DIAGNOSIS — R809 Proteinuria, unspecified: Secondary | ICD-10-CM | POA: Diagnosis not present

## 2016-03-25 DIAGNOSIS — I129 Hypertensive chronic kidney disease with stage 1 through stage 4 chronic kidney disease, or unspecified chronic kidney disease: Secondary | ICD-10-CM | POA: Diagnosis not present

## 2016-04-04 ENCOUNTER — Encounter: Payer: Self-pay | Admitting: Family Medicine

## 2016-04-04 ENCOUNTER — Ambulatory Visit (INDEPENDENT_AMBULATORY_CARE_PROVIDER_SITE_OTHER): Payer: Medicare Other | Admitting: Family Medicine

## 2016-04-04 VITALS — BP 118/76 | HR 94 | Temp 98.7°F | Resp 16 | Ht 63.0 in | Wt 240.0 lb

## 2016-04-04 DIAGNOSIS — K1370 Unspecified lesions of oral mucosa: Secondary | ICD-10-CM

## 2016-04-04 DIAGNOSIS — E1169 Type 2 diabetes mellitus with other specified complication: Secondary | ICD-10-CM | POA: Diagnosis not present

## 2016-04-04 DIAGNOSIS — I10 Essential (primary) hypertension: Secondary | ICD-10-CM | POA: Diagnosis not present

## 2016-04-04 DIAGNOSIS — D61818 Other pancytopenia: Secondary | ICD-10-CM | POA: Diagnosis not present

## 2016-04-04 DIAGNOSIS — E785 Hyperlipidemia, unspecified: Secondary | ICD-10-CM | POA: Diagnosis not present

## 2016-04-04 LAB — POCT GLYCOSYLATED HEMOGLOBIN (HGB A1C): HEMOGLOBIN A1C: 6.4

## 2016-04-04 LAB — GLUCOSE, POCT (MANUAL RESULT ENTRY): POC Glucose: 111 mg/dl — AB (ref 70–99)

## 2016-04-04 NOTE — Progress Notes (Signed)
Name: Teresa Carrillo   MRN: [id number]    DOB: 16-Jun-1941   Date:04/04/2016       Progress Note  Subjective  Chief Complaint  Chief Complaint  Patient presents with  . Diabetes    3 month follow up  . Hyperlipidemia  . Hypertension    Diabetes  She presents for her follow-up diabetic visit. She has type 2 diabetes mellitus. Her disease course has been stable. Pertinent negatives for hypoglycemia include no headaches. Associated symptoms include fatigue and polydipsia. Pertinent negatives for diabetes include no chest pain, no foot paresthesias and no polyuria. Pertinent negatives for diabetic complications include no CVA. Current diabetic treatment includes oral agent (monotherapy). Her breakfast blood glucose range is generally 130-140 mg/dl. An ACE inhibitor/angiotensin II receptor blocker is being taken. Eye exam is current.  Hyperlipidemia  This is a chronic problem. The problem is controlled. Recent lipid tests were reviewed and are normal. Associated symptoms include shortness of breath. Pertinent negatives include no chest pain, leg pain or myalgias. Current antihyperlipidemic treatment includes statins.  Hypertension  This is a chronic problem. The problem is controlled. Associated symptoms include shortness of breath. Pertinent negatives include no chest pain, headaches or palpitations. Past treatments include beta blockers, diuretics and ACE inhibitors. Hypertensive end-organ damage includes CAD/MI. There is no history of kidney disease or CVA.   Oral Lesion: Painful oral lesion present for 2 weeks, located on the left lower jaw towards the back, very painful to touch (even food particles hurt). She is chewing food on the opposite side.  No fevers or chills, Past Medical History:  Diagnosis Date  . Abdominal wall mass   . Anginal pain ([place])   . Anxiety   . Arthritis   . Calculus of kidney   . Cystitis   . Depression   . Diabetes mellitus without complication (HCC)    elevated A1c  . Dyspnea on exertion   . Elevated serum creatinine   . Fibrocystic breast disease   . GERD (gastroesophageal reflux disease)   . Hearing loss   . Heart murmur   . HTN (hypertension)   . Hyperlipidemia   . Microscopic hematuria   . Obesity   . Risk for falls   . Sleep apnea   . Thrombocytopenia ([place])   . Urinary frequency   . Urinary urgency     Past Surgical History:  Procedure Laterality Date  . ABDOMINAL HYSTERECTOMY    . APPENDECTOMY    . CARDIAC CATHETERIZATION     x2  . CHOLECYSTECTOMY    . COLONOSCOPY N/A 02/24/2015   Procedure: COLONOSCOPY;  Surgeon: Manya Silvas, MD;  Location: [facility] ENDOSCOPY;  Service: Endoscopy;  Laterality: N/A;  . DIAGNOSTIC LAPAROSCOPY     Removal of benign abdominal tumor  . ESOPHAGOGASTRODUODENOSCOPY N/A 02/24/2015   Procedure: ESOPHAGOGASTRODUODENOSCOPY (EGD);  Surgeon: Manya Silvas, MD;  Location: [facility] ENDOSCOPY;  Service: Endoscopy;  Laterality: N/A;  . right eye surgery Right     Family History  Problem Relation Age of Onset  . Congestive Heart Failure Mother   . Diabetes Mother   . Coronary artery disease Mother   . Stroke Mother   . Cirrhosis Father     Social History   Social History  . Marital status: Married    Spouse name: N/A  . Number of children: N/A  . Years of education: N/A   Occupational History  . Not on file.   Social History Main Topics  . Smoking status:  Former Smoker    Quit date: 09/09/1988  . Smokeless tobacco: Never Used     Comment: quit 25 years ago  . Alcohol use No  . Drug use: No  . Sexual activity: Not on file   Other Topics Concern  . Not on file   Social History Narrative  . No narrative on file     Current Outpatient Prescriptions:  .  albuterol (PROAIR HFA) 108 (90 Base) MCG/ACT inhaler, Inhale into the lungs., Disp: , Rfl:  .  bisoprolol-hydrochlorothiazide (ZIAC) 5-6.25 MG tablet, TAKE 1 TABLET BY MOUTH DAILY, Disp: 90 tablet, Rfl: 0 .  buPROPion  (WELLBUTRIN XL) 150 MG 24 hr tablet, Take 300 mg by mouth daily., Disp: , Rfl:  .  clonazePAM (KLONOPIN) 0.5 MG tablet, Take 0.5 mg by mouth 2 (two) times daily. , Disp: , Rfl:  .  lisinopril (PRINIVIL,ZESTRIL) 5 MG tablet, TAKE 1 TABLET BY MOUTH DAILY, Disp: 90 tablet, Rfl: 0 .  LUMIGAN 0.01 % SOLN, , Disp: , Rfl:  .  metFORMIN (GLUCOPHAGE) 500 MG tablet, TAKE 1 TABLET BY MOUTH DAILY, Disp: 90 tablet, Rfl: 0 .  Multiple Vitamin (MULTIVITAMIN WITH MINERALS) TABS tablet, Take 1 tablet by mouth daily., Disp: , Rfl:  .  Omega-3 Fatty Acids (FISH OIL) 1000 MG CAPS, Take 1 capsule by mouth daily., Disp: , Rfl:  .  omeprazole (PRILOSEC) 40 MG capsule, , Disp: , Rfl:  .  ONE TOUCH ULTRA TEST test strip, , Disp: , Rfl:  .  PROAIR HFA 108 (90 BASE) MCG/ACT inhaler, INHALE 2 INHALATIONS INTO THE LUNGS Q 6 H PRN FOR WHEEZING, Disp: , Rfl: 0 .  rosuvastatin (CRESTOR) 20 MG tablet, Take 1 tablet (20 mg total) by mouth daily., Disp: 90 tablet, Rfl: 1 .  sertraline (ZOLOFT) 100 MG tablet, Take 100 mg by mouth daily. , Disp: , Rfl:  .  Umeclidinium-Vilanterol (ANORO ELLIPTA) 62.5-25 MCG/INH AEPB, Inhale 1 puff into the lungs daily as needed., Disp: , Rfl:   Allergies  Allergen Reactions  . Macrobid [employer] Macro]      Review of Systems  Constitutional: Positive for fatigue.  Respiratory: Positive for shortness of breath.   Cardiovascular: Negative for chest pain and palpitations.  Musculoskeletal: Negative for myalgias.  Neurological: Negative for headaches.  Endo/Heme/Allergies: Positive for polydipsia.    Objective  Vitals:   04/04/16 0848  BP: 118/76  Pulse: 94  Resp: 16  Temp: 98.7 F (37.1 C)  TempSrc: Oral  SpO2: 94%  Weight: 240 lb (108.9 kg)  Height: 5\' 3"  (1.6 m)    Physical Exam  Constitutional: She is oriented to person, place, and time and well-developed, well-nourished, and in no distress.  HENT:  Head: Normocephalic and atraumatic.  Mouth/Throat:  Oropharynx is clear and moist.    A small inflammatory lesion with an erythematous base, tender to palpation, no drainage.  Cardiovascular: Normal rate, regular rhythm and normal heart sounds.   No murmur heard. Pulmonary/Chest: Effort normal and breath sounds normal. She has no wheezes.  Abdominal: Soft. Bowel sounds are normal. There is no tenderness.  Neurological: She is alert and oriented to person, place, and time.  Psychiatric: Mood, memory, affect and judgment normal.  Nursing note and vitals reviewed.     Assessment & Plan  1. Type 2 diabetes mellitus with other specified complication (HCC) 123456 is 6.4%, well-controlled diabetes, continue on present therapy - POCT HgB A1C - POCT Glucose (CBG)  2. Essential hypertension BP stable and controlled  on present hypertensive therapy  3. Pancytopenia [facility]) Patient is scheduled to see hematologist in August, repeat CBC today. - CBC with Differential  4. Hyperlipidemia  - Lipid Profile - COMPLETE METABOLIC PANEL WITH GFR  5. Oral mucosal lesion Unclear etiology, obtain titers for HSV. Encouraged rinsing mouth with salt water. - HSV(herpes simplex vrs) 1+2 ab-IgM    Dawood Spitler Asad A. [facility] 04/04/2016 9:07 AM

## 2016-04-08 LAB — HSV(HERPES SIMPLEX VRS) I + II AB-IGM: Herpes Simplex Vrs I&II-IgM Ab (EIA): 1.26 INDEX — ABNORMAL HIGH

## 2016-04-09 ENCOUNTER — Inpatient Hospital Stay: Payer: Medicare Other | Attending: Internal Medicine | Admitting: Internal Medicine

## 2016-04-09 ENCOUNTER — Encounter: Payer: Self-pay | Admitting: Internal Medicine

## 2016-04-09 VITALS — BP 155/72 | HR 69 | Temp 99.4°F | Resp 18 | Wt 240.7 lb

## 2016-04-09 DIAGNOSIS — I129 Hypertensive chronic kidney disease with stage 1 through stage 4 chronic kidney disease, or unspecified chronic kidney disease: Secondary | ICD-10-CM | POA: Diagnosis not present

## 2016-04-09 DIAGNOSIS — R5383 Other fatigue: Secondary | ICD-10-CM | POA: Diagnosis not present

## 2016-04-09 DIAGNOSIS — E785 Hyperlipidemia, unspecified: Secondary | ICD-10-CM

## 2016-04-09 DIAGNOSIS — R509 Fever, unspecified: Secondary | ICD-10-CM | POA: Insufficient documentation

## 2016-04-09 DIAGNOSIS — E669 Obesity, unspecified: Secondary | ICD-10-CM | POA: Insufficient documentation

## 2016-04-09 DIAGNOSIS — Z87891 Personal history of nicotine dependence: Secondary | ICD-10-CM | POA: Insufficient documentation

## 2016-04-09 DIAGNOSIS — N189 Chronic kidney disease, unspecified: Secondary | ICD-10-CM | POA: Diagnosis not present

## 2016-04-09 DIAGNOSIS — D61818 Other pancytopenia: Secondary | ICD-10-CM | POA: Diagnosis not present

## 2016-04-09 DIAGNOSIS — R531 Weakness: Secondary | ICD-10-CM | POA: Diagnosis not present

## 2016-04-09 DIAGNOSIS — R197 Diarrhea, unspecified: Secondary | ICD-10-CM | POA: Insufficient documentation

## 2016-04-09 DIAGNOSIS — M549 Dorsalgia, unspecified: Secondary | ICD-10-CM | POA: Diagnosis not present

## 2016-04-09 DIAGNOSIS — Z7984 Long term (current) use of oral hypoglycemic drugs: Secondary | ICD-10-CM | POA: Diagnosis not present

## 2016-04-09 DIAGNOSIS — E1122 Type 2 diabetes mellitus with diabetic chronic kidney disease: Secondary | ICD-10-CM | POA: Diagnosis not present

## 2016-04-09 DIAGNOSIS — R630 Anorexia: Secondary | ICD-10-CM | POA: Diagnosis not present

## 2016-04-09 DIAGNOSIS — D469 Myelodysplastic syndrome, unspecified: Secondary | ICD-10-CM | POA: Insufficient documentation

## 2016-04-09 DIAGNOSIS — K5900 Constipation, unspecified: Secondary | ICD-10-CM | POA: Insufficient documentation

## 2016-04-09 DIAGNOSIS — M1990 Unspecified osteoarthritis, unspecified site: Secondary | ICD-10-CM | POA: Diagnosis not present

## 2016-04-09 DIAGNOSIS — Z79899 Other long term (current) drug therapy: Secondary | ICD-10-CM | POA: Diagnosis not present

## 2016-04-09 DIAGNOSIS — G4730 Sleep apnea, unspecified: Secondary | ICD-10-CM | POA: Diagnosis not present

## 2016-04-09 DIAGNOSIS — K219 Gastro-esophageal reflux disease without esophagitis: Secondary | ICD-10-CM | POA: Diagnosis not present

## 2016-04-09 DIAGNOSIS — Z87442 Personal history of urinary calculi: Secondary | ICD-10-CM | POA: Insufficient documentation

## 2016-04-09 DIAGNOSIS — K1230 Oral mucositis (ulcerative), unspecified: Secondary | ICD-10-CM | POA: Insufficient documentation

## 2016-04-09 LAB — CBC WITH DIFFERENTIAL/PLATELET
BASOS ABS: 0 10*3/uL (ref 0–0.1)
BLASTS: 0 %
Band Neutrophils: 1 %
Basophils Relative: 0 %
Eosinophils Absolute: 0 10*3/uL (ref 0–0.7)
Eosinophils Relative: 0 %
HEMATOCRIT: 25.4 % — AB (ref 35.0–47.0)
HEMOGLOBIN: 8.5 g/dL — AB (ref 12.0–16.0)
Lymphocytes Relative: 73 %
Lymphs Abs: 1.2 10*3/uL (ref 1.0–3.6)
MCH: 34.4 pg — ABNORMAL HIGH (ref 26.0–34.0)
MCHC: 33.3 g/dL (ref 32.0–36.0)
MCV: 103.2 fL — ABNORMAL HIGH (ref 80.0–100.0)
METAMYELOCYTES PCT: 2 %
MONOS PCT: 3 %
MYELOCYTES: 1 %
Monocytes Absolute: 0 10*3/uL — ABNORMAL LOW (ref 0.2–0.9)
Neutro Abs: 0.4 10*3/uL — ABNORMAL LOW (ref 1.4–6.5)
Neutrophils Relative %: 20 %
Other: 0 %
Platelets: 122 10*3/uL — ABNORMAL LOW (ref 150–440)
Promyelocytes Absolute: 0 %
RBC: 2.46 MIL/uL — AB (ref 3.80–5.20)
RDW: 16.6 % — ABNORMAL HIGH (ref 11.5–14.5)
Smear Review: ADEQUATE
WBC: 1.6 10*3/uL — AB (ref 3.6–11.0)
nRBC: 0 /100 WBC

## 2016-04-09 LAB — APTT: aPTT: 44 seconds — ABNORMAL HIGH (ref 24–36)

## 2016-04-09 LAB — COMPREHENSIVE METABOLIC PANEL
ALT: 19 U/L (ref 14–54)
ANION GAP: 6 (ref 5–15)
AST: 22 U/L (ref 15–41)
Albumin: 4 g/dL (ref 3.5–5.0)
Alkaline Phosphatase: 64 U/L (ref 38–126)
BUN: 25 mg/dL — ABNORMAL HIGH (ref 6–20)
CHLORIDE: 104 mmol/L (ref 101–111)
CO2: 29 mmol/L (ref 22–32)
Calcium: 9.3 mg/dL (ref 8.9–10.3)
Creatinine, Ser: 1.18 mg/dL — ABNORMAL HIGH (ref 0.44–1.00)
GFR, EST AFRICAN AMERICAN: 51 mL/min — AB (ref >60)
GFR, EST NON AFRICAN AMERICAN: 44 mL/min — AB (ref >60)
Glucose, Bld: 101 mg/dL — ABNORMAL HIGH (ref 65–99)
POTASSIUM: 3.8 mmol/L (ref 3.5–5.1)
Sodium: 139 mmol/L (ref 135–145)
Total Bilirubin: 0.6 mg/dL (ref 0.3–1.2)
Total Protein: 7.9 g/dL (ref 6.5–8.1)

## 2016-04-09 LAB — PROTIME-INR
INR: 1.06
PROTHROMBIN TIME: 13.9 s (ref 11.4–15.2)

## 2016-04-09 LAB — FIBRINOGEN: Fibrinogen: 510 mg/dL — ABNORMAL HIGH (ref 210–475)

## 2016-04-09 LAB — RETICULOCYTES
RBC.: 2.4 MIL/uL — AB (ref 3.80–5.20)
RETIC COUNT ABSOLUTE: 120 10*3/uL (ref 19.0–183.0)
Retic Ct Pct: 5 % — ABNORMAL HIGH (ref 0.4–3.1)

## 2016-04-09 LAB — LACTATE DEHYDROGENASE: LDH: 182 U/L (ref 98–192)

## 2016-04-09 NOTE — Progress Notes (Signed)
Patient here today as new evaluation regarding pancytopenia.  Referred by Dr. Juleen China.

## 2016-04-09 NOTE — Progress Notes (Signed)
[facility] CONSULT NOTE  Patient Care Team: Roselee Nova, [name]  CHIEF COMPLAINTS/PURPOSE OF CONSULTATION:   # June 2017- Severe Pancytopenia due to your benign #   No history exists.     HISTORY OF PRESENTING ILLNESS:  Teresa Carrillo 75 y.o.  female with a prior history of chronic kidney disease- who follows up with nephrology- has been referred to [place] for further evaluation of her severe pancytopenia.  Patient states that approximately a month ago she noted to have significant fatigue and low-grade temperatures- that led to the visit to the emergency room; and she was diagnosed with a [place] spotted fever status post antibiotics.   Patient states however over the next many weeks- she has not noted any significant improvement in her clinical status. She continues to have low-grade temperatures; feels weak all over. Poor appetite. No significant weight loss. Intermittent back pain.   She denies any nosebleeds or gum bleeding. She denies any alcohol. Denies any new medications.   ROS: A complete 10 point review of system is done which is negative except mentioned above in history of present illness  MEDICAL HISTORY:  Past Medical History:  Diagnosis Date  . Abdominal wall mass   . Anginal pain ([place])   . Anxiety   . Arthritis   . Calculus of kidney   . Cystitis   . Depression   . Diabetes mellitus without complication (HCC)    elevated A1c  . Dyspnea on exertion   . Elevated serum creatinine   . Fibrocystic breast disease   . GERD (gastroesophageal reflux disease)   . Hearing loss   . Heart murmur   . HTN (hypertension)   . Hyperlipidemia   . Microscopic hematuria   . Obesity   . Risk for falls   . Sleep apnea   . Thrombocytopenia ([place])   . Urinary frequency   . Urinary urgency     SURGICAL HISTORY: Past Surgical History:  Procedure Laterality Date  . ABDOMINAL HYSTERECTOMY    . APPENDECTOMY    .  CARDIAC CATHETERIZATION     x2  . CHOLECYSTECTOMY    . COLONOSCOPY N/A 02/24/2015   Procedure: COLONOSCOPY;  Surgeon: Manya Silvas, MD;  Location[facility] ENDOSCOPY;  Service: Endoscopy;  Laterality: N/A;  . DIAGNOSTIC LAPAROSCOPY     Removal of benign abdominal tumor  . ESOPHAGOGASTRODUODENOSCOPY N/A 02/24/2015   Procedure: ESOPHAGOGASTRODUODENOSCOPY (EGD);  Surgeon: Manya Silvas, MD;  Location: [facility] ENDOSCOPY;  Service: Endoscopy;  Laterality: N/A;  . right eye surgery Right     SOCIAL HISTORY: lives with family; [employer]; [employer] in [place] retd. No smoking/ no alcohol.  Social History   Social History  . Marital status: Married    Spouse name: N/A  . Number of children: N/A  . Years of education: N/A   Occupational History  . Not on file.   Social History Main Topics  . Smoking status: Former Smoker    Quit date: 09/09/1988  . Smokeless tobacco: Never Used     Comment: quit 25 years ago  . Alcohol use No  . Drug use: No  . Sexual activity: Not on file   Other Topics Concern  . Not on file   Social History Narrative  . No narrative on file    FAMILY HISTORY: no cancers in family.  Family History  Problem Relation Age of Onset  . Congestive Heart Failure Mother   .  Diabetes Mother   . Coronary artery disease Mother   . Stroke Mother   . Cirrhosis Father     ALLERGIES:  is allergic to macrobid [nitrofurantoin monohyd macro].  MEDICATIONS:  Current Outpatient Prescriptions  Medication Sig Dispense Refill  . bisoprolol-hydrochlorothiazide (ZIAC) 5-6.25 MG tablet TAKE 1 TABLET BY MOUTH DAILY 90 tablet 0  . buPROPion (WELLBUTRIN XL) 150 MG 24 hr tablet Take 300 mg by mouth daily.    . clonazePAM (KLONOPIN) 0.5 MG tablet Take 0.5 mg by mouth 2 (two) times daily.     Marland Kitchen lisinopril (PRINIVIL,ZESTRIL) 5 MG tablet TAKE 1 TABLET BY MOUTH DAILY 90 tablet 0  . LUMIGAN 0.01 % SOLN     . metFORMIN (GLUCOPHAGE) 500 MG tablet TAKE 1 TABLET BY MOUTH DAILY 90 tablet 0  .  Multiple Vitamin (MULTIVITAMIN WITH MINERALS) TABS tablet Take 1 tablet by mouth daily.    . Omega-3 Fatty Acids (FISH OIL) 1000 MG CAPS Take 1 capsule by mouth daily.    . rosuvastatin (CRESTOR) 20 MG tablet Take 1 tablet (20 mg total) by mouth daily. 90 tablet 1  . sertraline (ZOLOFT) 100 MG tablet Take 100 mg by mouth daily.     Marland Kitchen Umeclidinium-Vilanterol (ANORO ELLIPTA) 62.5-25 MCG/INH AEPB Inhale 1 puff into the lungs daily as needed.     No current facility-administered medications for this visit.       Marland Kitchen  PHYSICAL EXAMINATION: ECOG PERFORMANCE STATUS: 1 - Symptomatic but completely ambulatory  Vitals:   04/09/16 1437  BP: (!) 155/72  Pulse: 69  Resp: 18  Temp: 99.4 F (37.4 C)   Filed Weights   04/09/16 1437  Weight: 240 lb 11.9 oz (109.2 kg)    GENERAL: Well-nourished well-developed; Alert, no distress and comfortable.   Obese. Accompanied by her sister-in-law. EYES: Positive for pallor. OROPHARYNX: no thrush or ulceration;  NECK: supple, no masses felt LYMPH:  no palpable lymphadenopathy in the cervical, axillary or inguinal regions LUNGS: clear to auscultation and  No wheeze or crackles HEART/CVS: regular rate & rhythm and no murmurs; No lower extremity edema ABDOMEN: abdomen soft, non-tender and normal bowel sounds Musculoskeletal:no cyanosis of digits and no clubbing  PSYCH: alert & oriented x 3 with fluent speech NEURO: no focal motor/sensory deficits SKIN:  no rashes or significant lesions  LABORATORY DATA:  I have reviewed the data as listed Lab Results  Component Value Date   WBC 1.6 (L) 04/09/2016   HGB 8.5 (L) 04/09/2016   HCT 25.4 (L) 04/09/2016   MCV 103.2 (H) 04/09/2016   PLT 122 (L) 04/09/2016    Recent Labs  05/05/15 1108 02/19/16 2002 04/09/16 1532  NA 145* 138 139  K 5.2 4.0 3.8  CL 103 101 104  CO2 _0 GLUCOSE 114* 119* 101*  BUN 20 23* 25*  CREATININE 0.95 1.14* 1.18*  CALCIUM 9.8 9.7 9.3  GFRNONAA 60 46* 44*  GFRAA 69  54* 51*  PROT 7.0  --  7.9  ALBUMIN 4.2  --  4.0  AST 17  --  22  ALT 16  --  19  ALKPHOS 80  --  64  BILITOT 0.3  --  0.6    RADIOGRAPHIC STUDIES: I have personally reviewed the radiological images as listed and agreed with the findings in the report. No results found.  ASSESSMENT & PLAN:   Pancytopenia (HCC) Severe pancytopenia/with severe neutropenia/anemia and thrombocytopenia. Unclear etiology- discussed benign [take more illness-RM F] versus malignant causes.  #  I would recommend a bone marrow biopsy for further evaluation; discussed the potential complications. It will be done through radiology.  # Check CBC CMP and LDH and PT PTT and fibrinogen. Also check peripheral smear. Peripheral blood flow cytometry. Also check B12.  Patient will tentatively follow-up with me in approximately 2 weeks; or sooner based upon the results of the above workup.  Thank you Dr. Juleen China for allowing me to participate in the care of your pleasant patient. Please do not hesitate to contact me with questions or concerns in the interim.  All questions were answered. The patient knows to call the clinic with any problems, questions or concerns.  # 45 minutes face-to-face with the patient discussing the above plan of care; more than 50% of time spent on prognosis/ natural history; counseling and coordination.     Cammie Sickle, MD 04/09/2016 8:12 PM

## 2016-04-09 NOTE — Assessment & Plan Note (Signed)
Severe pancytopenia/with severe neutropenia/anemia and thrombocytopenia. Unclear etiology- discussed benign [take more illness-RM F] versus malignant causes.  # I would recommend a bone marrow biopsy for further evaluation; discussed the potential complications. It will be done through radiology.  # Check CBC CMP and LDH and PT PTT and fibrinogen. Also check peripheral smear. Peripheral blood flow cytometry. Also check B12.  Patient will tentatively follow-up with me in approximately 2 weeks; or sooner based upon the results of the above workup.  Thank you Dr. Juleen China for allowing me to participate in the care of your pleasant patient. Please do not hesitate to contact me with questions or concerns in the interim.

## 2016-04-10 LAB — FOLATE: Folate: 20.2 ng/mL (ref >5.9)

## 2016-04-10 LAB — HEPATITIS C ANTIBODY

## 2016-04-10 LAB — HAPTOGLOBIN: HAPTOGLOBIN: 212 mg/dL — AB (ref 34–200)

## 2016-04-10 LAB — HEPATITIS B SURFACE ANTIGEN: Hepatitis B Surface Ag: NEGATIVE

## 2016-04-10 LAB — C-REACTIVE PROTEIN: CRP: 0.7 mg/dL (ref <1.0)

## 2016-04-10 LAB — HIV ANTIBODY (ROUTINE TESTING W REFLEX): HIV SCREEN 4TH GENERATION: NONREACTIVE

## 2016-04-10 LAB — PATHOLOGIST SMEAR REVIEW

## 2016-04-10 LAB — HEPATITIS B CORE ANTIBODY, IGM: Hep B C IgM: NEGATIVE

## 2016-04-10 LAB — VITAMIN B12: VITAMIN B 12: 1065 pg/mL — AB (ref 180–914)

## 2016-04-11 LAB — MULTIPLE MYELOMA PANEL, SERUM
ALBUMIN SERPL ELPH-MCNC: 3.6 g/dL (ref 2.9–4.4)
ALPHA 1: 0.3 g/dL (ref 0.0–0.4)
ALPHA2 GLOB SERPL ELPH-MCNC: 0.9 g/dL (ref 0.4–1.0)
Albumin/Glob SerPl: 1 (ref 0.7–1.7)
B-GLOBULIN SERPL ELPH-MCNC: 1.1 g/dL (ref 0.7–1.3)
GAMMA GLOB SERPL ELPH-MCNC: 1.5 g/dL (ref 0.4–1.8)
GLOBULIN, TOTAL: 3.7 g/dL (ref 2.2–3.9)
IGG (IMMUNOGLOBIN G), SERUM: 1174 mg/dL (ref 700–1600)
IgA: 259 mg/dL (ref 64–422)
IgM, Serum: 444 mg/dL — ABNORMAL HIGH (ref 26–217)
Total Protein ELP: 7.3 g/dL (ref 6.0–8.5)

## 2016-04-12 LAB — COMP PANEL: LEUKEMIA/LYMPHOMA

## 2016-04-16 ENCOUNTER — Other Ambulatory Visit: Payer: Self-pay | Admitting: Physician Assistant

## 2016-04-16 ENCOUNTER — Other Ambulatory Visit: Payer: Self-pay | Admitting: General Surgery

## 2016-04-16 ENCOUNTER — Telehealth: Payer: Self-pay | Admitting: Emergency Medicine

## 2016-04-16 ENCOUNTER — Telehealth: Payer: Self-pay | Admitting: Internal Medicine

## 2016-04-16 ENCOUNTER — Other Ambulatory Visit: Payer: Self-pay | Admitting: Emergency Medicine

## 2016-04-16 DIAGNOSIS — H35012 Changes in retinal vascular appearance, left eye: Secondary | ICD-10-CM | POA: Diagnosis not present

## 2016-04-16 MED ORDER — VALACYCLOVIR HCL 1 G PO TABS: 1000.0000 mg | ORAL_TABLET | Freq: Two times a day (BID) | ORAL | 0 refills | Status: DC

## 2016-04-16 NOTE — Telephone Encounter (Signed)
I personally contacted - I explained that I have reached back to speciality scheduling this morning to determine when the bone marrow biopsy will be scheduled. I will f/u with this again the scheduling team this afternoon to determine when the procedure will be scheduled.  Pt's lab results were briefly reviewed with patient via telephone. I explained that her labs still demonstrated pancytopenia.  MD was able to r/o hepatitis / HIV.  I explained that her flow cytometry report recommended further work up with bone marrow biopsy to determine the cause of her abnormal labs.  She voiced concerns that her bone marrow biopsy results would not be back in time for her [place] apt.  I explained that if necessary, Dr. B could post pone her md apt by a few days until results are available.  I offered to have pt come to [facility] the later part of the week next week if necessary.  She thanked me for updating her on the lab results and plan of care.

## 2016-04-16 NOTE — Progress Notes (Unsigned)
valtrex

## 2016-04-16 NOTE — Telephone Encounter (Signed)
I contacted Judy in [facility] speciality Scheduling today to determine when pt will be scheduled for her bone/ marrow biopsy.  Scheduling checklist was faxed 8/2 and confirmation received. I explained that this form was refaxed this morning.  Judy confirmed that cancer center was notified on 8/2.  Judy in scheduling spoke personally with Colette in cancer center scheduling.  Per Judy-Msg left with Colette to have pt be schedule In [place] as the [facility] ct machine was not available for a biopsy procedure. This is procedure was not scheduled at that time in [place] and the msg was not communicated back to Dr. Brahmanday.  I personally contacted [facility] scheduling today and spoke to Alease at [phone].  Alease was able to schedule the bone marrow biopsy in [place] tomorrow.  Pt informed of the procedure date/time.  I provided the following information to the patient.  She should arrive at [place] at  645 am 8/9.  I provided the following address to patient:  [address] [place], [place] [address].  Instructed her to go to the dept main entrance 1st floor radiology dept.  She was instructed to be npo midnight. I explained that she may take her routine bp medications with a sip of water only. All other medications should be held. She was reminded to bring a driver.   

## 2016-04-16 NOTE — Telephone Encounter (Signed)
Patient notified of lab. Script sent to [facility]

## 2016-04-16 NOTE — Telephone Encounter (Signed)
Patient called to find out status of bone marrow bx. Please call her. Thanks.

## 2016-04-17 ENCOUNTER — Ambulatory Visit (HOSPITAL_COMMUNITY)
Admission: RE | Admit: 2016-04-17 | Discharge: 2016-04-17 | Disposition: A | Discharge status: Home / Self Care | Payer: Medicare Other | Source: Ambulatory Visit | Attending: Internal Medicine | Admitting: Internal Medicine

## 2016-04-17 ENCOUNTER — Encounter (HOSPITAL_COMMUNITY): Payer: Self-pay

## 2016-04-17 DIAGNOSIS — G4730 Sleep apnea, unspecified: Secondary | ICD-10-CM | POA: Insufficient documentation

## 2016-04-17 DIAGNOSIS — N189 Chronic kidney disease, unspecified: Secondary | ICD-10-CM | POA: Diagnosis not present

## 2016-04-17 DIAGNOSIS — F418 Other specified anxiety disorders: Secondary | ICD-10-CM | POA: Diagnosis not present

## 2016-04-17 DIAGNOSIS — Z87891 Personal history of nicotine dependence: Secondary | ICD-10-CM | POA: Diagnosis not present

## 2016-04-17 DIAGNOSIS — D61818 Other pancytopenia: Secondary | ICD-10-CM | POA: Insufficient documentation

## 2016-04-17 DIAGNOSIS — R3129 Other microscopic hematuria: Secondary | ICD-10-CM | POA: Diagnosis not present

## 2016-04-17 DIAGNOSIS — Z87442 Personal history of urinary calculi: Secondary | ICD-10-CM | POA: Diagnosis not present

## 2016-04-17 DIAGNOSIS — D4622 Refractory anemia with excess of blasts 2: Secondary | ICD-10-CM | POA: Diagnosis not present

## 2016-04-17 DIAGNOSIS — I129 Hypertensive chronic kidney disease with stage 1 through stage 4 chronic kidney disease, or unspecified chronic kidney disease: Secondary | ICD-10-CM | POA: Diagnosis not present

## 2016-04-17 DIAGNOSIS — E669 Obesity, unspecified: Secondary | ICD-10-CM | POA: Diagnosis not present

## 2016-04-17 DIAGNOSIS — Z9049 Acquired absence of other specified parts of digestive tract: Secondary | ICD-10-CM | POA: Insufficient documentation

## 2016-04-17 DIAGNOSIS — E785 Hyperlipidemia, unspecified: Secondary | ICD-10-CM | POA: Insufficient documentation

## 2016-04-17 DIAGNOSIS — Z90710 Acquired absence of both cervix and uterus: Secondary | ICD-10-CM | POA: Diagnosis not present

## 2016-04-17 DIAGNOSIS — Z6841 Body Mass Index (BMI) 40.0 and over, adult: Secondary | ICD-10-CM | POA: Insufficient documentation

## 2016-04-17 DIAGNOSIS — Z881 Allergy status to other antibiotic agents status: Secondary | ICD-10-CM | POA: Insufficient documentation

## 2016-04-17 DIAGNOSIS — Z823 Family history of stroke: Secondary | ICD-10-CM | POA: Insufficient documentation

## 2016-04-17 DIAGNOSIS — K219 Gastro-esophageal reflux disease without esophagitis: Secondary | ICD-10-CM | POA: Diagnosis not present

## 2016-04-17 DIAGNOSIS — Z8249 Family history of ischemic heart disease and other diseases of the circulatory system: Secondary | ICD-10-CM | POA: Diagnosis not present

## 2016-04-17 DIAGNOSIS — E1122 Type 2 diabetes mellitus with diabetic chronic kidney disease: Secondary | ICD-10-CM | POA: Insufficient documentation

## 2016-04-17 LAB — CBC
HEMATOCRIT: 29.5 % — AB (ref 36.0–46.0)
Hemoglobin: 9.5 g/dL — ABNORMAL LOW (ref 12.0–15.0)
MCH: 33.3 pg (ref 26.0–34.0)
MCHC: 32.2 g/dL (ref 30.0–36.0)
MCV: 103.5 fL — AB (ref 78.0–100.0)
PLATELETS: 90 10*3/uL — AB (ref 150–400)
RBC: 2.85 MIL/uL — AB (ref 3.87–5.11)
RDW: 16.1 % — AB (ref 11.5–15.5)
WBC: 1.9 10*3/uL — AB (ref 4.0–10.5)

## 2016-04-17 LAB — APTT: aPTT: 43 seconds — ABNORMAL HIGH (ref 24–36)

## 2016-04-17 LAB — PROTIME-INR
INR: 1.16
Prothrombin Time: 14.9 seconds (ref 11.4–15.2)

## 2016-04-17 LAB — GLUCOSE, CAPILLARY: GLUCOSE-CAPILLARY: 120 mg/dL — AB (ref 65–99)

## 2016-04-17 LAB — BONE MARROW EXAM

## 2016-04-17 MED ORDER — MIDAZOLAM HCL 2 MG/2ML IJ SOLN
INTRAMUSCULAR | Status: AC
  Filled 2016-04-17: qty 4

## 2016-04-17 MED ORDER — FENTANYL CITRATE (PF) 100 MCG/2ML IJ SOLN
INTRAMUSCULAR | Status: AC
  Filled 2016-04-17: qty 4

## 2016-04-17 MED ORDER — MIDAZOLAM HCL 2 MG/2ML IJ SOLN
INTRAMUSCULAR | Status: AC
  Filled 2016-04-17: qty 6

## 2016-04-17 MED ORDER — MIDAZOLAM HCL 2 MG/2ML IJ SOLN
INTRAMUSCULAR | Status: AC | PRN
  Administered 2016-04-17 (×3): 1 mg via INTRAVENOUS

## 2016-04-17 MED ORDER — HYDROCODONE-ACETAMINOPHEN 5-325 MG PO TABS
1.0000 | ORAL_TABLET | ORAL | Status: DC | PRN
  Filled 2016-04-17: qty 2

## 2016-04-17 MED ORDER — FENTANYL CITRATE (PF) 100 MCG/2ML IJ SOLN
INTRAMUSCULAR | Status: AC | PRN
  Administered 2016-04-17: 50 ug via INTRAVENOUS

## 2016-04-17 MED ORDER — SODIUM CHLORIDE 0.9 % IV SOLN
INTRAVENOUS | Status: DC
  Administered 2016-04-17: 08:00:00 via INTRAVENOUS

## 2016-04-17 NOTE — H&P (Signed)
Chief Complaint: pancytopenia  Referring Physician: Dr. Charlaine Dalton  Supervising Physician: Jacqulynn Cadet  Patient Status: Out-pt  HPI: Teresa Carrillo is an 75 y.o. female who is followed by a nephrologist for her chronic kidney disease.  She had some lab work done recently and she was found to have pancytopenia.  She was referred to a hematologist.  After review of her labs, he felt that the patient needed a bone marrow biopsy to further evaluate her lab abnormalities.  The patient complaints of being tired, but otherwise no new complaints or symptoms.  Past Medical History:  Past Medical History:  Diagnosis Date  . Abdominal wall mass   . Anginal pain ([place])   . Anxiety   . Arthritis   . Calculus of kidney   . Cystitis   . Depression   . Diabetes mellitus without complication (HCC)    elevated A1c  . Dyspnea on exertion   . Elevated serum creatinine   . Fibrocystic breast disease   . GERD (gastroesophageal reflux disease)   . Hearing loss   . Heart murmur   . HTN (hypertension)   . Hyperlipidemia   . Microscopic hematuria   . Obesity   . Risk for falls   . Sleep apnea   . Thrombocytopenia ([place])   . Urinary frequency   . Urinary urgency     Past Surgical History:  Past Surgical History:  Procedure Laterality Date  . ABDOMINAL HYSTERECTOMY    . APPENDECTOMY    . CARDIAC CATHETERIZATION     x2  . CHOLECYSTECTOMY    . COLONOSCOPY N/A 02/24/2015   Procedure: COLONOSCOPY;  Surgeon: Manya Silvas, MD;  Location: [facility] ENDOSCOPY;  Service: Endoscopy;  Laterality: N/A;  . DIAGNOSTIC LAPAROSCOPY     Removal of benign abdominal tumor  . ESOPHAGOGASTRODUODENOSCOPY N/A 02/24/2015   Procedure: ESOPHAGOGASTRODUODENOSCOPY (EGD);  Surgeon: Manya Silvas, MD;  Location[facility] ENDOSCOPY;  Service: Endoscopy;  Laterality: N/A;  . right eye surgery Right     Family History:  Family History  Problem Relation Age of Onset  . Congestive Heart Failure Mother   .  Diabetes Mother   . Coronary artery disease Mother   . Stroke Mother   . Cirrhosis Father     Social History:  reports that she quit smoking about 27 years ago. She has never used smokeless tobacco. She reports that she does not drink alcohol or use drugs.  Allergies:  Allergies  Allergen Reactions  . Macrobid [employer] Macro]     Medications: medications reviewed in epic  Please HPI for pertinent positives, otherwise complete 10 system ROS negative.  Mallampati Score: MD Evaluation Airway: WNL Heart: WNL Abdomen: WNL Chest/ Lungs: WNL ASA  Classification: 3 Mallampati/Airway Score: Two  Physical Exam: BP (!) 148/70   Pulse 64   Temp 99.4 F (37.4 C) (Oral)   Resp 18   Ht '5\' 3"'$  (1.6 m)   Wt 240 lb 11.9 oz (109.2 kg)   SpO2 99%   BMI 42.65 kg/m  Body mass index is 42.65 kg/m. General: pleasant, obese white female who is laying in bed in NAD HEENT: head is normocephalic, atraumatic.  Sclera are noninjected.  PERRL. Left eye lid with some droop.  Ears and nose without any masses or lesions.  Mouth is pink and moist Heart: regular, rate, and rhythm.  Normal s1,s2. No obvious murmurs, gallops, or rubs noted.  Palpable radial and pedal pulses bilaterally Lungs: CTAB, no wheezes,  rhonchi, or rales noted.  Respiratory effort nonlabored Abd: soft, NT, ND, obese, +BS, no masses, hernias, or organomegaly Psych: A&Ox3 with an appropriate affect.   Labs: Results for orders placed or performed during the hospital encounter of 04/17/16 (from the past 48 hour(s))  APTT upon arrival     Status: Abnormal   Collection Time: 04/17/16  7:20 AM  Result Value Ref Range   aPTT 43 (H) 24 - 36 seconds    Comment:        IF BASELINE aPTT IS ELEVATED, SUGGEST PATIENT RISK ASSESSMENT BE USED TO DETERMINE APPROPRIATE ANTICOAGULANT THERAPY.   CBC upon arrival     Status: Abnormal   Collection Time: 04/17/16  7:20 AM  Result Value Ref Range   WBC 1.9 (L) 4.0 - 10.5 K/uL    RBC 2.85 (L) 3.87 - 5.11 MIL/uL   Hemoglobin 9.5 (L) 12.0 - 15.0 g/dL   HCT 29.5 (L) 36.0 - 46.0 %   MCV 103.5 (H) 78.0 - 100.0 fL   MCH 33.3 26.0 - 34.0 pg   MCHC 32.2 30.0 - 36.0 g/dL   RDW 16.1 (H) 11.5 - 15.5 %   Platelets 90 (L) 150 - 400 K/uL    Comment: PLATELET COUNT CONFIRMED BY SMEAR  Protime-INR upon arrival     Status: None   Collection Time: 04/17/16  7:20 AM  Result Value Ref Range   Prothrombin Time 14.9 11.4 - 15.2 seconds   INR 1.16     Imaging: No results found.  Assessment/Plan 1. Pancytopenia -we will proceed today with a bone marrow biopsy -her labs and vitals have been reviewed. -Risks and Benefits discussed with the patient including, but not limited to bleeding, infection, damage to adjacent structures or low yield requiring additional tests. All of the patient's questions were answered, patient is agreeable to proceed. Consent signed and in chart.   Thank you for this interesting consult.  I greatly enjoyed meeting Teresa Carrillo and look forward to participating in their care.  A copy of this report was sent to the requesting provider on this date.  Electronically Signed: Henreitta Cea 04/17/2016, 8:27 AM   I spent a total of  30 Minutes   in face to face in clinical consultation, greater than 50% of which was counseling/coordinating care for pancytopenia

## 2016-04-17 NOTE — Procedures (Signed)
Interventional Radiology Procedure Note  Procedure: CT guided aspirate and core biopsy of right iliac bone Complications: None Recommendations: - Bedrest supine x 1 hrs - Hydrocodone PRN  Pain - Follow biopsy results  Signed,  Heath K. McCullough, MD   

## 2016-04-17 NOTE — Discharge Instructions (Signed)
Bone Marrow Aspiration and Bone Marrow Biopsy °Bone marrow aspiration and bone marrow biopsy are procedures that are done to diagnose blood disorders. You may also have one of these procedures to help diagnose infections or some types of cancer. °Bone marrow is the soft tissue that is inside your bones. Blood cells are produced in bone marrow. For bone marrow aspiration, a sample of tissue in liquid form is removed from inside your bone. For a bone marrow biopsy, a small core of bone marrow tissue is removed. Then these samples are examined under a microscope or tested in a lab. °You may need these procedures if you have an abnormal complete blood count (CBC). The aspiration or biopsy sample is usually taken from the top of your hip bone. Sometimes, an aspiration sample is taken from your chest bone (sternum). °LET YOUR HEALTH CARE PROVIDER KNOW ABOUT: °· Any allergies you have. °· All medicines you are taking, including vitamins, herbs, eye drops, creams, and over-the-counter medicines. °· Previous problems you or members of your family have had with the use of anesthetics. °· Any blood disorders you have. °· Previous surgeries you have had. °· Any medical conditions you may have. °· Whether you are pregnant or you think that you may be pregnant. °RISKS AND COMPLICATIONS °Generally, this is a safe procedure. However, problems may occur, including: °· Infection. °· Bleeding. °BEFORE THE PROCEDURE °· Ask your health care provider about: °¨ Changing or stopping your regular medicines. This is especially important if you are taking diabetes medicines or blood thinners. °¨ Taking medicines such as aspirin and ibuprofen. These medicines can thin your blood. Do not take these medicines before your procedure if your health care provider instructs you not to. °· Plan to have someone take you home after the procedure. °· If you go home right after the procedure, plan to have someone with you for 24 hours. °PROCEDURE  °· An  IV tube may be inserted into one of your veins. °· The injection site will be cleaned with a germ-killing solution (antiseptic). °· You will be given one or more of the following: °¨ A medicine that helps you relax (sedative). °¨ A medicine that numbs the area (local anesthetic). °· The bone marrow sample will be removed as follows: °¨ For an aspiration, a hollow needle will be inserted through your skin and into your bone. Bone marrow fluid will be drawn up into a syringe. °¨ For a biopsy, your health care provider will use a hollow needle to remove a core of tissue from your bone marrow. °· The needle will be removed. °· A bandage (dressing) will be placed over the insertion site and taped in place. °The procedure may vary among health care providers and hospitals. °AFTER THE PROCEDURE °· Your blood pressure, heart rate, breathing rate, and blood oxygen level will be monitored often until the medicines you were given have worn off. °· Return to your normal activities as directed by your health care provider. °  °This information is not intended to replace advice given to you by your health care provider. Make sure you discuss any questions you have with your health care provider. °  °Document Released: 08/29/2004 Document Revised: 01/10/2015 Document Reviewed: 08/17/2014 °Elsevier Interactive Patient Education ©2016 [employer] °Bone Marrow Aspiration and Bone Marrow Biopsy, Care After °Refer to this sheet in the next few weeks. These instructions provide you with information about caring for yourself after your procedure. Your health care provider may also give you   you more specific instructions. Your treatment has been planned according to current medical practices, but problems sometimes occur. Call your health care provider if you have any problems or questions after your procedure. °WHAT TO EXPECT AFTER THE PROCEDURE °After your procedure, it is common to have: °· Soreness or tenderness around the puncture  site. °· Bruising. °HOME CARE INSTRUCTIONS °· Take medicines only as directed by your health care provider. °· Follow your health care provider's instructions about: °¨ Puncture site care. °¨ Bandage (dressing) changes and removal. °· Bathe and shower as directed by your health care provider. °· Check your puncture site every day for signs of infection. Watch for: °¨ Redness, swelling, or pain. °¨ Fluid, blood, or pus. °· Return to your normal activities as directed by your health care provider. °· Keep all follow-up visits as directed by your health care provider. This is important. °SEEK MEDICAL CARE IF: °· You have a fever. °· You have uncontrollable bleeding. °· You have redness, swelling, or pain at the site of your puncture. °· You have fluid, blood, or pus coming from your puncture site. °  °This information is not intended to replace advice given to you by your health care provider. Make sure you discuss any questions you have with your health care provider. °  °Document Released: 03/15/2005 Document Revised: 01/10/2015 Document Reviewed: 08/17/2014 °Elsevier Interactive Patient Education ©2016 [employer] Inc[employer] ° °Moderate Conscious Sedation, Adult, Care After °Refer to this sheet in the next few weeks. These instructions provide you with information on caring for yourself after your procedure. Your health care provider may also give you more specific instructions. Your treatment has been planned according to current medical practices, but problems sometimes occur. Call your health care provider if you have any problems or questions after your procedure. °WHAT TO EXPECT AFTER THE PROCEDURE  °After your procedure: °· You may feel sleepy, clumsy, and have poor balance for several hours. °· Vomiting may occur if you eat too soon after the procedure. °HOME CARE INSTRUCTIONS °· Do not participate in any activities where you could become injured for at least 24 hours. Do not: °¨ Drive. °¨ Swim. °¨ Ride a  bicycle. °¨ Operate heavy machinery. °¨ Cook. °¨ Use power tools. °¨ Climb ladders. °¨ Work from a high place. °· Do not make important decisions or sign legal documents until you are improved. °· If you vomit, drink water, juice, or soup when you can drink without vomiting. Make sure you have little or no nausea before eating solid foods. °· Only take over-the-counter or prescription medicines for pain, discomfort, or fever as directed by your health care provider. °· Make sure you and your family fully understand everything about the medicines given to you, including what side effects may occur. °· You should not drink alcohol, take sleeping pills, or take medicines that cause drowsiness for at least 24 hours. °· If you smoke, do not smoke without supervision. °· If you are feeling better, you may resume normal activities 24 hours after you were sedated. °· Keep all appointments with your health care provider. °SEEK MEDICAL CARE IF: °· Your skin is pale or bluish in color. °· You continue to feel nauseous or vomit. °· Your pain is getting worse and is not helped by medicine. °· You have bleeding or swelling. °· You are still sleepy or feeling clumsy after 24 hours. °SEEK IMMEDIATE MEDICAL CARE IF: °· You develop a rash. °· You have difficulty breathing. °· You develop any   type of allergic problem. °· You have a fever. °MAKE SURE YOU: °· Understand these instructions. °· Will watch your condition. °· Will get help right away if you are not doing well or get worse. °  °This information is not intended to replace advice given to you by your health care provider. Make sure you discuss any questions you have with your health care provider. °  °Document Released: 06/16/2013 Document Revised: 09/16/2014 Document Reviewed: 06/16/2013 °Elsevier Interactive Patient Education ©2016 Elsevier Inc. ° °

## 2016-04-22 ENCOUNTER — Other Ambulatory Visit: Payer: Self-pay | Admitting: *Deleted

## 2016-04-22 DIAGNOSIS — D61818 Other pancytopenia: Secondary | ICD-10-CM

## 2016-04-23 ENCOUNTER — Other Ambulatory Visit: Payer: Self-pay | Admitting: Internal Medicine

## 2016-04-23 ENCOUNTER — Inpatient Hospital Stay (HOSPITAL_BASED_OUTPATIENT_CLINIC_OR_DEPARTMENT_OTHER): Payer: Medicare Other | Admitting: Internal Medicine

## 2016-04-23 ENCOUNTER — Inpatient Hospital Stay: Payer: Medicare Other

## 2016-04-23 ENCOUNTER — Encounter: Payer: Self-pay | Admitting: Internal Medicine

## 2016-04-23 VITALS — BP 131/73 | HR 64 | Temp 98.3°F | Resp 17 | Ht 63.0 in | Wt 240.0 lb

## 2016-04-23 DIAGNOSIS — Z87442 Personal history of urinary calculi: Secondary | ICD-10-CM | POA: Diagnosis not present

## 2016-04-23 DIAGNOSIS — R5383 Other fatigue: Secondary | ICD-10-CM | POA: Diagnosis not present

## 2016-04-23 DIAGNOSIS — N189 Chronic kidney disease, unspecified: Secondary | ICD-10-CM

## 2016-04-23 DIAGNOSIS — K219 Gastro-esophageal reflux disease without esophagitis: Secondary | ICD-10-CM

## 2016-04-23 DIAGNOSIS — K5900 Constipation, unspecified: Secondary | ICD-10-CM | POA: Diagnosis not present

## 2016-04-23 DIAGNOSIS — D46Z Other myelodysplastic syndromes: Secondary | ICD-10-CM

## 2016-04-23 DIAGNOSIS — R630 Anorexia: Secondary | ICD-10-CM

## 2016-04-23 DIAGNOSIS — M549 Dorsalgia, unspecified: Secondary | ICD-10-CM | POA: Diagnosis not present

## 2016-04-23 DIAGNOSIS — Z7984 Long term (current) use of oral hypoglycemic drugs: Secondary | ICD-10-CM

## 2016-04-23 DIAGNOSIS — E1122 Type 2 diabetes mellitus with diabetic chronic kidney disease: Secondary | ICD-10-CM

## 2016-04-23 DIAGNOSIS — R509 Fever, unspecified: Secondary | ICD-10-CM | POA: Diagnosis not present

## 2016-04-23 DIAGNOSIS — K1230 Oral mucositis (ulcerative), unspecified: Secondary | ICD-10-CM | POA: Diagnosis not present

## 2016-04-23 DIAGNOSIS — M1990 Unspecified osteoarthritis, unspecified site: Secondary | ICD-10-CM | POA: Diagnosis not present

## 2016-04-23 DIAGNOSIS — Z87891 Personal history of nicotine dependence: Secondary | ICD-10-CM | POA: Diagnosis not present

## 2016-04-23 DIAGNOSIS — R531 Weakness: Secondary | ICD-10-CM

## 2016-04-23 DIAGNOSIS — E785 Hyperlipidemia, unspecified: Secondary | ICD-10-CM | POA: Diagnosis not present

## 2016-04-23 DIAGNOSIS — D61818 Other pancytopenia: Secondary | ICD-10-CM

## 2016-04-23 DIAGNOSIS — G4730 Sleep apnea, unspecified: Secondary | ICD-10-CM | POA: Diagnosis not present

## 2016-04-23 DIAGNOSIS — I129 Hypertensive chronic kidney disease with stage 1 through stage 4 chronic kidney disease, or unspecified chronic kidney disease: Secondary | ICD-10-CM

## 2016-04-23 DIAGNOSIS — D469 Myelodysplastic syndrome, unspecified: Secondary | ICD-10-CM | POA: Diagnosis not present

## 2016-04-23 DIAGNOSIS — Z79899 Other long term (current) drug therapy: Secondary | ICD-10-CM | POA: Diagnosis not present

## 2016-04-23 DIAGNOSIS — R197 Diarrhea, unspecified: Secondary | ICD-10-CM | POA: Diagnosis not present

## 2016-04-23 HISTORY — DX: Other myelodysplastic syndromes: D46.Z

## 2016-04-23 LAB — CBC WITH DIFFERENTIAL/PLATELET
BAND NEUTROPHILS: 0 %
BASOS ABS: 0 10*3/uL (ref 0–0.1)
BLASTS: 0 %
Basophils Relative: 0 %
EOS PCT: 0 %
Eosinophils Absolute: 0 10*3/uL (ref 0–0.7)
HEMATOCRIT: 27.6 % — AB (ref 35.0–47.0)
HEMOGLOBIN: 9.2 g/dL — AB (ref 12.0–16.0)
Lymphocytes Relative: 62 %
Lymphs Abs: 0.8 10*3/uL — ABNORMAL LOW (ref 1.0–3.6)
MCH: 34.3 pg — ABNORMAL HIGH (ref 26.0–34.0)
MCHC: 33.4 g/dL (ref 32.0–36.0)
MCV: 102.5 fL — ABNORMAL HIGH (ref 80.0–100.0)
MONOS PCT: 4 %
Metamyelocytes Relative: 0 %
Monocytes Absolute: 0 10*3/uL — ABNORMAL LOW (ref 0.2–0.9)
Myelocytes: 6 %
NEUTROS ABS: 0.4 10*3/uL — AB (ref 1.4–6.5)
Neutrophils Relative %: 28 %
Other: 0 %
Platelets: 86 10*3/uL — ABNORMAL LOW (ref 150–440)
Promyelocytes Absolute: 0 %
RBC: 2.69 MIL/uL — AB (ref 3.80–5.20)
RDW: 16.1 % — ABNORMAL HIGH (ref 11.5–14.5)
WBC: 1.4 10*3/uL — AB (ref 3.6–11.0)
nRBC: 0 /100 WBC

## 2016-04-23 LAB — COMPREHENSIVE METABOLIC PANEL
ALBUMIN: 3.9 g/dL (ref 3.5–5.0)
ALK PHOS: 61 U/L (ref 38–126)
ALT: 19 U/L (ref 14–54)
ANION GAP: 6 (ref 5–15)
AST: 20 U/L (ref 15–41)
BILIRUBIN TOTAL: 0.3 mg/dL (ref 0.3–1.2)
BUN: 21 mg/dL — AB (ref 6–20)
CALCIUM: 9.2 mg/dL (ref 8.9–10.3)
CO2: 29 mmol/L (ref 22–32)
CREATININE: 1.1 mg/dL — AB (ref 0.44–1.00)
Chloride: 105 mmol/L (ref 101–111)
GFR calc Af Amer: 56 mL/min — ABNORMAL LOW (ref >60)
GFR calc non Af Amer: 48 mL/min — ABNORMAL LOW (ref >60)
GLUCOSE: 117 mg/dL — AB (ref 65–99)
Potassium: 4.4 mmol/L (ref 3.5–5.1)
Sodium: 140 mmol/L (ref 135–145)
TOTAL PROTEIN: 7.6 g/dL (ref 6.5–8.1)

## 2016-04-23 MED ORDER — PROCHLORPERAZINE MALEATE 10 MG PO TABS: 10.0000 mg | ORAL_TABLET | Freq: Four times a day (QID) | ORAL | 1 refills | Status: DC | PRN

## 2016-04-23 MED ORDER — ONDANSETRON HCL 8 MG PO TABS: 8.0000 mg | ORAL_TABLET | Freq: Two times a day (BID) | ORAL | 1 refills | Status: DC | PRN

## 2016-04-23 NOTE — Assessment & Plan Note (Signed)
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 We will order NSG.   # Discussed that this is a serious life-threatening condition. The curative option being- allogeneic stem cell transplant; offered at the [facility]/insurance preference]. Palliative option/not curative option- hypomethylating agents.   # I had a long discussion the patient/family regarding the treatment options- of using lower intensity chemotherapy- of Vidaza day 1 through 7 subcutaneous. Discussed the potential side effects-nausea vomiting diarrhea/cytopenias-risk of infection; need for transfusion; bleeding.  # Will plan to start treatment- August 21st; chemotherapy education. Prescription for antinausea medication sent.  # Patient will come to [facility] for treatments/follow-up. The above plan of care was discussed with the patient/family in detail. Multiple questions were asked and answered appropriately.   # Patient will follow-up with me in approximately 2 weeks with CBC BMP.   # 40 minutes face-to-face with the patient discussing the above plan of care; more than 50% of time spent on prognosis/ natural history; counseling and coordination.

## 2016-04-23 NOTE — Progress Notes (Signed)
[place] NOTE  Patient Care Team: Roselee Nova, [name]  CHIEF COMPLAINTS/PURPOSE OF CONSULTATION:   Oncology History   #JUNE 2017- Severe neutropenia/ Anemia ~hb 9/platlets- 85-100 AUG 2017- REFRACTORY ANEMIA with EXCESS BLASTS [14% blasts- BMBx]; cytogenetic-pending.   # AUG 21st-  START AZA 2m/m2 day- 1-7 q 28 days.      MDS (myelodysplastic syndrome), high grade ([place]   04/23/2016 Initial Diagnosis    MDS (myelodysplastic syndrome), high grade (HCC)        HISTORY OF PRESENTING ILLNESS:  Teresa Cowper89y.o.  female with Mild chronic kidney disease- is here to review the results of her bone marrow biopsy/that was ordered for severe pancytopenia.  Patient continues to feel fatigued. Denies any new fevers. She feels weak. Poor appetite. No significant weight loss. Intermittent back pain. She denies any nosebleeds or gum bleeding.    ROS: A complete 10 point review of system is done which is negative except mentioned above in history of present illness  MEDICAL HISTORY:  Past Medical History:  Diagnosis Date  . Abdominal wall mass   . Anginal pain ([place]   . Anxiety   . Arthritis   . Calculus of kidney   . Cystitis   . Depression   . Diabetes mellitus without complication (HCC)    elevated A1c  . Dyspnea on exertion   . Elevated serum creatinine   . Fibrocystic breast disease   . GERD (gastroesophageal reflux disease)   . Hearing loss   . Heart murmur   . HTN (hypertension)   . Hyperlipidemia   . MDS (myelodysplastic syndrome), high grade ([place] 04/23/2016  . Microscopic hematuria   . Obesity   . Risk for falls   . Sleep apnea   . [place]rombocytopenia ([place]   . Urinary frequency   . Urinary urgency     SURGICAL HISTORY: Past Surgical History:  Procedure Laterality Date  . ABDOMINAL HYSTERECTOMY    . APPENDECTOMY    . CARDIAC CATHETERIZATION     x2  . CHOLECYSTECTOMY    . COLONOSCOPY N/A  02/24/2015   Procedure: COLONOSCOPY;  Surgeon: RManya Silvas MD;  [facility];  Service: Endoscopy;  Laterality: N/A;  . DIAGNOSTIC LAPAROSCOPY     Removal of benign abdominal tumor  . ESOPHAGOGASTRODUODENOSCOPY N/A 02/24/2015   Procedure: ESOPHAGOGASTRODUODENOSCOPY (EGD);  Surgeon: RManya Silvas MD;  [facility];  Service: Endoscopy;  Laterality: N/A;  . right eye surgery Right     SOCIAL HISTORY: [place] No smoking/ no alcohol.  Social History   Social History  . Marital status: Married    Spouse name: N/A  . Number of children: N/A  . Years of education: N/A   Occupational History  . Not on file.   Social History Main Topics  . Smoking status: Former Smoker    Quit date: 09/09/1988  . Smokeless tobacco: Never Used     Comment: quit 25 years ago  . Alcohol use No  . Drug use: No  . Sexual activity: Not on file   Other Topics Concern  . Not on file   Social History Narrative  . No narrative on file    FAMILY HISTORY: no cancers in family.  Family History  Problem Relation Age of Onset  . Congestive Heart Failure Mother   . Diabetes Mother   . Coronary artery disease Mother   .  Stroke Mother   . Cirrhosis Father     ALLERGIES:  is allergic to macrobid [nitrofurantoin monohyd macro].  MEDICATIONS:  Current Outpatient Prescriptions  Medication Sig Dispense Refill  . bisoprolol-hydrochlorothiazide (ZIAC) 5-6.25 MG tablet TAKE 1 TABLET BY MOUTH DAILY 90 tablet 0  . buPROPion (WELLBUTRIN XL) 150 MG 24 hr tablet Take 300 mg by mouth daily.    . clonazePAM (KLONOPIN) 0.5 MG tablet Take 0.5 mg by mouth 2 (two) times daily.     Marland Kitchen lisinopril (PRINIVIL,ZESTRIL) 5 MG tablet TAKE 1 TABLET BY MOUTH DAILY 90 tablet 0  . LUMIGAN 0.01 % SOLN     . metFORMIN (GLUCOPHAGE) 500 MG tablet TAKE 1 TABLET BY MOUTH DAILY 90 tablet 0  . Multiple Vitamin (MULTIVITAMIN WITH MINERALS) TABS tablet Take 1 tablet by  mouth daily.    . Omega-3 Fatty Acids (FISH OIL) 1000 MG CAPS Take 1 capsule by mouth daily.    . rosuvastatin (CRESTOR) 20 MG tablet Take 1 tablet (20 mg total) by mouth daily. 90 tablet 1  . sertraline (ZOLOFT) 100 MG tablet Take 100 mg by mouth daily.     Marland Kitchen Umeclidinium-Vilanterol (ANORO ELLIPTA) 62.5-25 MCG/INH AEPB Inhale 1 puff into the lungs daily as needed.    . valACYclovir (VALTREX) 1000 MG tablet Take 1 tablet (1,000 mg total) by mouth 2 (two) times daily. 2 tablet 0  . ondansetron (ZOFRAN) 8 MG tablet Take 1 tablet (8 mg total) by mouth 2 (two) times daily as needed (Nausea or vomiting). 30 tablet 1  . prochlorperazine (COMPAZINE) 10 MG tablet Take 1 tablet (10 mg total) by mouth every 6 (six) hours as needed (Nausea or vomiting). 30 tablet 1   No current facility-administered medications for this visit.       Marland Kitchen  PHYSICAL EXAMINATION: ECOG PERFORMANCE STATUS: 1 - Symptomatic but completely ambulatory  Vitals:   04/23/16 1537  BP: 131/73  Pulse: 64  Resp: 17  Temp: 98.3 F (36.8 C)   Filed Weights   04/23/16 1537  Weight: 239 lb 15.5 oz (108.8 kg)    GENERAL: Well-nourished well-developed; Alert, no distress and comfortable.   Obese. Accompanied by her sister-in-law/husband.  EYES: Positive for pallor. OROPHARYNX: no thrush or ulceration;  NECK: supple, no masses felt LYMPH:  no palpable lymphadenopathy in the cervical, axillary or inguinal regions LUNGS: clear to auscultation and  No wheeze or crackles HEART/CVS: regular rate & rhythm and no murmurs; No lower extremity edema ABDOMEN: abdomen soft, non-tender and normal bowel sounds Musculoskeletal:no cyanosis of digits and no clubbing  PSYCH: alert & oriented x 3 with fluent speech NEURO: no focal motor/sensory deficits SKIN:  no rashes or significant lesions  LABORATORY DATA:  I have reviewed the data as listed Lab Results  Component Value Date   WBC 1.4 (LL) 04/23/2016   HGB 9.2 (L) 04/23/2016   HCT  27.6 (L) 04/23/2016   MCV 102.5 (H) 04/23/2016   PLT 86 (L) 04/23/2016    Recent Labs  05/05/15 1108 02/19/16 2002 04/09/16 1532 04/23/16 1527  NA 145* 138 139 140  K 5.2 4.0 3.8 4.4  CL 103 101 104 105  CO2 _0 GLUCOSE 114* 119* 101* 117*  BUN 20 23* 25* 21*  CREATININE 0.95 1.14* 1.18* 1.10*  CALCIUM 9.8 9.7 9.3 9.2  GFRNONAA 60 46* 44* 48*  GFRAA 69 54* 51* 56*  PROT 7.0  --  7.9 7.6  ALBUMIN 4.2  --  4.0 3.9  AST 17  --  22 20  ALT 16  --  19 19  ALKPHOS 80  --  64 61  BILITOT 0.3  --  0.6 0.3    RADIOGRAPHIC STUDIES: I have personally reviewed the radiological images as listed and agreed with the findings in the report. Ct Biopsy  Result Date: 04/17/2016 INDICATION: 75 year old female with pancytopenia EXAM: CT GUIDED BONE MARROW ASPIRATION AND CORE BIOPSY Interventional Radiologist:  Criselda Peaches, MD MEDICATIONS: None. ANESTHESIA/SEDATION: Moderate (conscious) sedation was employed during this procedure. A total of 3 milligrams versed and 50 micrograms fentanyl were administered intravenously. The patient's level of consciousness and vital signs were monitored continuously by radiology nursing throughout the procedure under my direct supervision. Total monitored sedation time: 10 minutes FLUOROSCOPY TIME:  None COMPLICATIONS: None immediate. Estimated blood loss: <25 mL PROCEDURE: Informed written consent was obtained from the patient after a thorough discussion of the procedural risks, benefits and alternatives. All questions were addressed. Maximal Sterile Barrier Technique was utilized including caps, mask, sterile gowns, sterile gloves, sterile drape, hand hygiene and skin antiseptic. A timeout was performed prior to the initiation of the procedure. The patient was positioned prone and non-contrast localization CT was performed of the pelvis to demonstrate the iliac marrow spaces. Maximal barrier sterile technique utilized including caps, mask, sterile gowns,  sterile gloves, large sterile drape, hand hygiene, and betadine prep. Under sterile conditions and local anesthesia, an 11 gauge coaxial bone biopsy needle was advanced into the right iliac marrow space. Needle position was confirmed with CT imaging. Initially, bone marrow aspiration was performed. Next, the 11 gauge outer cannula was utilized to obtain a right iliac bone marrow core biopsy. Needle was removed. Hemostasis was obtained with compression. The patient tolerated the procedure well. Samples were prepared with the cytotechnologist. IMPRESSION: Technically successful right iliac bone marrow biopsy. Electronically Signed   By: Jacqulynn Cadet M.D.   On: 04/17/2016 10:37    ASSESSMENT & PLAN:   MDS (myelodysplastic syndrome), high grade (HCC) Refractory anemia- with excess blasts- II [blasts percentage 14%]. We will order NSG.   # Discussed that this is a serious life-threatening condition. The curative option being- allogeneic stem cell transplant; offered at the [facility]/insurance preference]. Palliative option/not curative option- hypomethylating agents.   # I had a long discussion the patient/family regarding the treatment options- of using lower intensity chemotherapy- of Vidaza day 1 through 7 subcutaneous. Discussed the potential side effects-nausea vomiting diarrhea/cytopenias-risk of infection; need for transfusion; bleeding.  # Will plan to start treatment- August 21st; chemotherapy education. Prescription for antinausea medication sent.  # Patient will come to [facility] for treatments/follow-up. The above plan of care was discussed with the patient/family in detail. Multiple questions were asked and answered appropriately.   # Patient will follow-up with me in approximately 2 weeks with CBC BMP.   # 40 minutes face-to-face with the patient discussing the above plan of care; more than 50% of time spent on prognosis/ natural history; counseling and  coordination.   All questions were answered. The patient knows to call the clinic with any problems, questions or concerns. Refer pt to [place] for transplant evaluation.   # 45 minutes face-to-face with the patient discussing the above plan of care; more than 50% of time spent on prognosis/ natural history; counseling and coordination.     Cammie Sickle, MD 04/23/2016 6:35 PM

## 2016-04-23 NOTE — Progress Notes (Signed)
Bone marrow biopsy last week 04/16/2016.  No other concerns,.  Critical WBC called in of 1.4 MD made aware seeing pt in clinic today

## 2016-04-24 ENCOUNTER — Other Ambulatory Visit: Payer: Self-pay

## 2016-04-24 DIAGNOSIS — D46Z Other myelodysplastic syndromes: Secondary | ICD-10-CM

## 2016-04-25 ENCOUNTER — Inpatient Hospital Stay: Payer: Medicare Other

## 2016-04-25 NOTE — Patient Instructions (Signed)
Azacitidine suspension for injection (subcutaneous use) What is this medicine? AZACITIDINE (ay za SITE i deen) is a chemotherapy drug. This medicine reduces the growth of cancer cells and can suppress the immune system. It is used for treating myelodysplastic syndrome or some types of leukemia. This medicine may be used for other purposes; ask your health care provider or pharmacist if you have questions. What should I tell my health care provider before I take this medicine? They need to know if you have any of these conditions: -infection (especially a virus infection such as chickenpox, cold sores, or herpes) -kidney disease -liver disease -liver tumors -an unusual or allergic reaction to azacitidine, mannitol, other medicines, foods, dyes, or preservatives -pregnant or trying to get pregnant -breast-feeding How should I use this medicine? This medicine is for injection under the skin. It is administered in a hospital or clinic by a specially trained health care professional. Talk to your pediatrician regarding the use of this medicine in children. While this drug may be prescribed for selected conditions, precautions do apply. Overdosage: If you think you have taken too much of this medicine contact a poison control center or emergency room at once. NOTE: This medicine is only for you. Do not share this medicine with others. What if I miss a dose? It is important not to miss your dose. Call your doctor or health care professional if you are unable to keep an appointment. What may interact with this medicine? Interactions have not been studied. Give your health care provider a list of all the medicines, herbs, non-prescription drugs, or dietary supplements you use. Also tell them if you smoke, drink alcohol, or use illegal drugs. Some items may interact with your medicine. This list may not describe all possible interactions. Give your health care provider a list of all the medicines,  herbs, non-prescription drugs, or dietary supplements you use. Also tell them if you smoke, drink alcohol, or use illegal drugs. Some items may interact with your medicine. What should I watch for while using this medicine? Visit your doctor for checks on your progress. This drug may make you feel generally unwell. This is not uncommon, as chemotherapy can affect healthy cells as well as cancer cells. Report any side effects. Continue your course of treatment even though you feel ill unless your doctor tells you to stop. In some cases, you may be given additional medicines to help with side effects. Follow all directions for their use. Call your doctor or health care professional for advice if you get a fever, chills or sore throat, or other symptoms of a cold or flu. Do not treat yourself. This drug decreases your body's ability to fight infections. Try to avoid being around people who are sick. This medicine may increase your risk to bruise or bleed. Call your doctor or health care professional if you notice any unusual bleeding. Do not have any vaccinations without your doctor's approval and avoid anyone who has recently had oral polio vaccine. Do not become pregnant while taking this medicine. Women should inform their doctor if they wish to become pregnant or think they might be pregnant. There is a potential for serious side effects to an unborn child. Talk to your health care professional or pharmacist for more information. Do not breast-feed an infant while taking this medicine. If you are a man, you should not father a child while receiving treatment. What side effects may I notice from receiving this medicine? Side effects that you should report   to your doctor or health care professional as soon as possible: -allergic reactions like skin rash, itching or hives, swelling of the face, lips, or tongue -low blood counts - this medicine may decrease the number of white blood cells, red blood cells  and platelets. You may be at increased risk for infections and bleeding. -signs of infection - fever or chills, cough, sore throat, pain or difficulty passing urine -signs of decreased platelets or bleeding - bruising, pinpoint red spots on the skin, black, tarry stools, blood in the urine -signs of decreased red blood cells - unusually weak or tired, fainting spells, lightheadedness -reactions at the injection site including redness, pain, itching, or bruising -breathing problems -changes in vision -fever -mouth sores -stomach pain -vomiting Side effects that usually do not require medical attention (report to your doctor or health care professional if they continue or are bothersome): -constipation -diarrhea -loss of appetite -nausea -pain or redness at the injection site -weak or tired This list may not describe all possible side effects. Call your doctor for medical advice about side effects. You may report side effects to FDA at 1-800-FDA-1088. Where should I keep my medicine? This drug is given in a hospital or clinic and will not be stored at home. NOTE: This sheet is a summary. It may not cover all possible information. If you have questions about this medicine, talk to your doctor, pharmacist, or health care provider.    2016, [employer]vier/Gold Standard. (2014-05-20 18:13:53)  

## 2016-04-29 ENCOUNTER — Ambulatory Visit: Payer: Medicare Other | Admitting: Internal Medicine

## 2016-04-29 ENCOUNTER — Other Ambulatory Visit: Payer: Self-pay | Admitting: Internal Medicine

## 2016-04-29 ENCOUNTER — Inpatient Hospital Stay: Payer: Medicare Other

## 2016-04-29 VITALS — BP 132/73 | HR 64 | Temp 97.4°F | Resp 20

## 2016-04-29 DIAGNOSIS — M549 Dorsalgia, unspecified: Secondary | ICD-10-CM | POA: Diagnosis not present

## 2016-04-29 DIAGNOSIS — K1230 Oral mucositis (ulcerative), unspecified: Secondary | ICD-10-CM | POA: Diagnosis not present

## 2016-04-29 DIAGNOSIS — D46Z Other myelodysplastic syndromes: Secondary | ICD-10-CM

## 2016-04-29 DIAGNOSIS — D469 Myelodysplastic syndrome, unspecified: Secondary | ICD-10-CM | POA: Diagnosis not present

## 2016-04-29 DIAGNOSIS — M1990 Unspecified osteoarthritis, unspecified site: Secondary | ICD-10-CM | POA: Diagnosis not present

## 2016-04-29 DIAGNOSIS — R197 Diarrhea, unspecified: Secondary | ICD-10-CM | POA: Diagnosis not present

## 2016-04-29 DIAGNOSIS — Z87442 Personal history of urinary calculi: Secondary | ICD-10-CM | POA: Diagnosis not present

## 2016-04-29 DIAGNOSIS — R509 Fever, unspecified: Secondary | ICD-10-CM | POA: Diagnosis not present

## 2016-04-29 DIAGNOSIS — Z79899 Other long term (current) drug therapy: Secondary | ICD-10-CM | POA: Diagnosis not present

## 2016-04-29 DIAGNOSIS — E785 Hyperlipidemia, unspecified: Secondary | ICD-10-CM | POA: Diagnosis not present

## 2016-04-29 DIAGNOSIS — K219 Gastro-esophageal reflux disease without esophagitis: Secondary | ICD-10-CM | POA: Diagnosis not present

## 2016-04-29 DIAGNOSIS — N189 Chronic kidney disease, unspecified: Secondary | ICD-10-CM | POA: Diagnosis not present

## 2016-04-29 DIAGNOSIS — I129 Hypertensive chronic kidney disease with stage 1 through stage 4 chronic kidney disease, or unspecified chronic kidney disease: Secondary | ICD-10-CM | POA: Diagnosis not present

## 2016-04-29 DIAGNOSIS — Z7984 Long term (current) use of oral hypoglycemic drugs: Secondary | ICD-10-CM | POA: Diagnosis not present

## 2016-04-29 DIAGNOSIS — E1122 Type 2 diabetes mellitus with diabetic chronic kidney disease: Secondary | ICD-10-CM | POA: Diagnosis not present

## 2016-04-29 DIAGNOSIS — R5383 Other fatigue: Secondary | ICD-10-CM | POA: Diagnosis not present

## 2016-04-29 DIAGNOSIS — K5900 Constipation, unspecified: Secondary | ICD-10-CM | POA: Diagnosis not present

## 2016-04-29 DIAGNOSIS — D61818 Other pancytopenia: Secondary | ICD-10-CM | POA: Diagnosis not present

## 2016-04-29 DIAGNOSIS — R531 Weakness: Secondary | ICD-10-CM | POA: Diagnosis not present

## 2016-04-29 DIAGNOSIS — Z87891 Personal history of nicotine dependence: Secondary | ICD-10-CM | POA: Diagnosis not present

## 2016-04-29 DIAGNOSIS — G4730 Sleep apnea, unspecified: Secondary | ICD-10-CM | POA: Diagnosis not present

## 2016-04-29 LAB — CHROMOSOME ANALYSIS, BONE MARROW

## 2016-04-29 LAB — TISSUE HYBRIDIZATION (BONE MARROW)-NCBH

## 2016-04-29 MED ORDER — ONDANSETRON HCL 4 MG PO TABS
8.0000 mg | ORAL_TABLET | Freq: Once | ORAL | Status: AC
  Administered 2016-04-29: 8 mg via ORAL
  Filled 2016-04-29: qty 2

## 2016-04-29 MED ORDER — AZACITIDINE CHEMO SQ INJECTION
75.0000 mg/m2 | Freq: Once | INTRAMUSCULAR | Status: AC
  Administered 2016-04-29: 165 mg via SUBCUTANEOUS
  Filled 2016-04-29: qty 6.6

## 2016-04-30 ENCOUNTER — Inpatient Hospital Stay: Payer: Medicare Other

## 2016-04-30 VITALS — BP 96/57 | HR 69 | Temp 97.4°F | Resp 20

## 2016-04-30 DIAGNOSIS — D46Z Other myelodysplastic syndromes: Secondary | ICD-10-CM

## 2016-04-30 DIAGNOSIS — E785 Hyperlipidemia, unspecified: Secondary | ICD-10-CM | POA: Diagnosis not present

## 2016-04-30 DIAGNOSIS — Z87891 Personal history of nicotine dependence: Secondary | ICD-10-CM | POA: Diagnosis not present

## 2016-04-30 DIAGNOSIS — Z7984 Long term (current) use of oral hypoglycemic drugs: Secondary | ICD-10-CM | POA: Diagnosis not present

## 2016-04-30 DIAGNOSIS — R5383 Other fatigue: Secondary | ICD-10-CM | POA: Diagnosis not present

## 2016-04-30 DIAGNOSIS — R509 Fever, unspecified: Secondary | ICD-10-CM | POA: Diagnosis not present

## 2016-04-30 DIAGNOSIS — Z79899 Other long term (current) drug therapy: Secondary | ICD-10-CM | POA: Diagnosis not present

## 2016-04-30 DIAGNOSIS — E1122 Type 2 diabetes mellitus with diabetic chronic kidney disease: Secondary | ICD-10-CM | POA: Diagnosis not present

## 2016-04-30 DIAGNOSIS — M549 Dorsalgia, unspecified: Secondary | ICD-10-CM | POA: Diagnosis not present

## 2016-04-30 DIAGNOSIS — N189 Chronic kidney disease, unspecified: Secondary | ICD-10-CM | POA: Diagnosis not present

## 2016-04-30 DIAGNOSIS — M1990 Unspecified osteoarthritis, unspecified site: Secondary | ICD-10-CM | POA: Diagnosis not present

## 2016-04-30 DIAGNOSIS — K1230 Oral mucositis (ulcerative), unspecified: Secondary | ICD-10-CM | POA: Diagnosis not present

## 2016-04-30 DIAGNOSIS — K5900 Constipation, unspecified: Secondary | ICD-10-CM | POA: Diagnosis not present

## 2016-04-30 DIAGNOSIS — K219 Gastro-esophageal reflux disease without esophagitis: Secondary | ICD-10-CM | POA: Diagnosis not present

## 2016-04-30 DIAGNOSIS — R531 Weakness: Secondary | ICD-10-CM | POA: Diagnosis not present

## 2016-04-30 DIAGNOSIS — I129 Hypertensive chronic kidney disease with stage 1 through stage 4 chronic kidney disease, or unspecified chronic kidney disease: Secondary | ICD-10-CM | POA: Diagnosis not present

## 2016-04-30 DIAGNOSIS — D61818 Other pancytopenia: Secondary | ICD-10-CM | POA: Diagnosis not present

## 2016-04-30 DIAGNOSIS — G4730 Sleep apnea, unspecified: Secondary | ICD-10-CM | POA: Diagnosis not present

## 2016-04-30 DIAGNOSIS — Z87442 Personal history of urinary calculi: Secondary | ICD-10-CM | POA: Diagnosis not present

## 2016-04-30 DIAGNOSIS — D469 Myelodysplastic syndrome, unspecified: Secondary | ICD-10-CM | POA: Diagnosis not present

## 2016-04-30 DIAGNOSIS — R197 Diarrhea, unspecified: Secondary | ICD-10-CM | POA: Diagnosis not present

## 2016-04-30 MED ORDER — AZACITIDINE CHEMO SQ INJECTION
75.0000 mg/m2 | Freq: Once | INTRAMUSCULAR | Status: AC
  Administered 2016-04-30: 165 mg via SUBCUTANEOUS
  Filled 2016-04-30: qty 6.6

## 2016-04-30 MED ORDER — ONDANSETRON HCL 4 MG PO TABS
8.0000 mg | ORAL_TABLET | Freq: Once | ORAL | Status: AC
  Administered 2016-04-30: 8 mg via ORAL
  Filled 2016-04-30: qty 2

## 2016-04-30 NOTE — Progress Notes (Signed)
Patient's blood pressure is low today at 96/57 with no symptoms.  Dr. Rogue Bussing informed and he recommends patient to hold both bp meds this afternoon will re-check tomorrow at infusion appt.  Patient is aware.

## 2016-05-01 ENCOUNTER — Telehealth: Payer: Self-pay

## 2016-05-01 ENCOUNTER — Encounter (HOSPITAL_COMMUNITY): Payer: Self-pay

## 2016-05-01 ENCOUNTER — Inpatient Hospital Stay: Payer: Medicare Other

## 2016-05-01 ENCOUNTER — Encounter: Payer: Self-pay | Admitting: *Deleted

## 2016-05-01 ENCOUNTER — Other Ambulatory Visit: Payer: Self-pay | Admitting: *Deleted

## 2016-05-01 VITALS — BP 125/71 | HR 80 | Temp 99.5°F | Resp 18

## 2016-05-01 DIAGNOSIS — R531 Weakness: Secondary | ICD-10-CM | POA: Diagnosis not present

## 2016-05-01 DIAGNOSIS — Z7984 Long term (current) use of oral hypoglycemic drugs: Secondary | ICD-10-CM | POA: Diagnosis not present

## 2016-05-01 DIAGNOSIS — K1230 Oral mucositis (ulcerative), unspecified: Secondary | ICD-10-CM | POA: Diagnosis not present

## 2016-05-01 DIAGNOSIS — K5900 Constipation, unspecified: Secondary | ICD-10-CM | POA: Diagnosis not present

## 2016-05-01 DIAGNOSIS — G4730 Sleep apnea, unspecified: Secondary | ICD-10-CM | POA: Diagnosis not present

## 2016-05-01 DIAGNOSIS — Z79899 Other long term (current) drug therapy: Secondary | ICD-10-CM | POA: Diagnosis not present

## 2016-05-01 DIAGNOSIS — E1122 Type 2 diabetes mellitus with diabetic chronic kidney disease: Secondary | ICD-10-CM | POA: Diagnosis not present

## 2016-05-01 DIAGNOSIS — D469 Myelodysplastic syndrome, unspecified: Secondary | ICD-10-CM | POA: Diagnosis not present

## 2016-05-01 DIAGNOSIS — R509 Fever, unspecified: Secondary | ICD-10-CM | POA: Diagnosis not present

## 2016-05-01 DIAGNOSIS — R5383 Other fatigue: Secondary | ICD-10-CM | POA: Diagnosis not present

## 2016-05-01 DIAGNOSIS — D46Z Other myelodysplastic syndromes: Secondary | ICD-10-CM

## 2016-05-01 DIAGNOSIS — I129 Hypertensive chronic kidney disease with stage 1 through stage 4 chronic kidney disease, or unspecified chronic kidney disease: Secondary | ICD-10-CM | POA: Diagnosis not present

## 2016-05-01 DIAGNOSIS — M1990 Unspecified osteoarthritis, unspecified site: Secondary | ICD-10-CM | POA: Diagnosis not present

## 2016-05-01 DIAGNOSIS — R197 Diarrhea, unspecified: Secondary | ICD-10-CM | POA: Diagnosis not present

## 2016-05-01 DIAGNOSIS — M549 Dorsalgia, unspecified: Secondary | ICD-10-CM | POA: Diagnosis not present

## 2016-05-01 DIAGNOSIS — E785 Hyperlipidemia, unspecified: Secondary | ICD-10-CM | POA: Diagnosis not present

## 2016-05-01 DIAGNOSIS — K219 Gastro-esophageal reflux disease without esophagitis: Secondary | ICD-10-CM | POA: Diagnosis not present

## 2016-05-01 DIAGNOSIS — Z87442 Personal history of urinary calculi: Secondary | ICD-10-CM | POA: Diagnosis not present

## 2016-05-01 DIAGNOSIS — D61818 Other pancytopenia: Secondary | ICD-10-CM | POA: Diagnosis not present

## 2016-05-01 DIAGNOSIS — N189 Chronic kidney disease, unspecified: Secondary | ICD-10-CM | POA: Diagnosis not present

## 2016-05-01 DIAGNOSIS — Z87891 Personal history of nicotine dependence: Secondary | ICD-10-CM | POA: Diagnosis not present

## 2016-05-01 DIAGNOSIS — K1231 Oral mucositis (ulcerative) due to antineoplastic therapy: Secondary | ICD-10-CM

## 2016-05-01 MED ORDER — ONDANSETRON HCL 4 MG PO TABS
8.0000 mg | ORAL_TABLET | Freq: Once | ORAL | Status: AC
  Administered 2016-05-01: 8 mg via ORAL
  Filled 2016-05-01: qty 2

## 2016-05-01 MED ORDER — AZACITIDINE CHEMO SQ INJECTION
75.0000 mg/m2 | Freq: Once | INTRAMUSCULAR | Status: AC
  Administered 2016-05-01: 165 mg via SUBCUTANEOUS
  Filled 2016-05-01: qty 6.6

## 2016-05-01 MED ORDER — MAGIC MOUTHWASH W/LIDOCAINE: 5.0000 mL | Freq: Four times a day (QID) | ORAL | 3 refills | Status: DC

## 2016-05-01 NOTE — Progress Notes (Signed)
Prior auth submitted for magic mouthwash-medicated. Submitted via covermymeds-to [facility] [facility].

## 2016-05-01 NOTE — Telephone Encounter (Signed)
Called and left vm on patient's phone. Asking pt to hold her bp meds.

## 2016-05-01 NOTE — Telephone Encounter (Signed)
Patient did not take both of her bp meds yesterday afternoon (Lisinopril 5mg , Bisoprolol-HCTZ 5-6.25mg ) and her bp on check in the office today is 125/71.  How does she need to proceed with her meds?

## 2016-05-01 NOTE — Telephone Encounter (Signed)
Teresa Carrillo- continue to hold off blood pressures meds for now.

## 2016-05-02 ENCOUNTER — Inpatient Hospital Stay: Payer: Medicare Other

## 2016-05-02 ENCOUNTER — Telehealth: Payer: Self-pay | Admitting: Family Medicine

## 2016-05-02 VITALS — BP 156/82 | HR 90 | Temp 99.8°F | Resp 20

## 2016-05-02 DIAGNOSIS — K1230 Oral mucositis (ulcerative), unspecified: Secondary | ICD-10-CM | POA: Diagnosis not present

## 2016-05-02 DIAGNOSIS — R531 Weakness: Secondary | ICD-10-CM | POA: Diagnosis not present

## 2016-05-02 DIAGNOSIS — Z87442 Personal history of urinary calculi: Secondary | ICD-10-CM | POA: Diagnosis not present

## 2016-05-02 DIAGNOSIS — G4730 Sleep apnea, unspecified: Secondary | ICD-10-CM | POA: Diagnosis not present

## 2016-05-02 DIAGNOSIS — Z79899 Other long term (current) drug therapy: Secondary | ICD-10-CM | POA: Diagnosis not present

## 2016-05-02 DIAGNOSIS — I129 Hypertensive chronic kidney disease with stage 1 through stage 4 chronic kidney disease, or unspecified chronic kidney disease: Secondary | ICD-10-CM | POA: Diagnosis not present

## 2016-05-02 DIAGNOSIS — D46Z Other myelodysplastic syndromes: Secondary | ICD-10-CM

## 2016-05-02 DIAGNOSIS — Z87891 Personal history of nicotine dependence: Secondary | ICD-10-CM | POA: Diagnosis not present

## 2016-05-02 DIAGNOSIS — E785 Hyperlipidemia, unspecified: Secondary | ICD-10-CM | POA: Diagnosis not present

## 2016-05-02 DIAGNOSIS — Z7984 Long term (current) use of oral hypoglycemic drugs: Secondary | ICD-10-CM | POA: Diagnosis not present

## 2016-05-02 DIAGNOSIS — D469 Myelodysplastic syndrome, unspecified: Secondary | ICD-10-CM | POA: Diagnosis not present

## 2016-05-02 DIAGNOSIS — N189 Chronic kidney disease, unspecified: Secondary | ICD-10-CM | POA: Diagnosis not present

## 2016-05-02 DIAGNOSIS — R5383 Other fatigue: Secondary | ICD-10-CM | POA: Diagnosis not present

## 2016-05-02 DIAGNOSIS — M549 Dorsalgia, unspecified: Secondary | ICD-10-CM | POA: Diagnosis not present

## 2016-05-02 DIAGNOSIS — E1122 Type 2 diabetes mellitus with diabetic chronic kidney disease: Secondary | ICD-10-CM | POA: Diagnosis not present

## 2016-05-02 DIAGNOSIS — K5900 Constipation, unspecified: Secondary | ICD-10-CM | POA: Diagnosis not present

## 2016-05-02 DIAGNOSIS — D61818 Other pancytopenia: Secondary | ICD-10-CM | POA: Diagnosis not present

## 2016-05-02 DIAGNOSIS — M1990 Unspecified osteoarthritis, unspecified site: Secondary | ICD-10-CM | POA: Diagnosis not present

## 2016-05-02 DIAGNOSIS — K219 Gastro-esophageal reflux disease without esophagitis: Secondary | ICD-10-CM | POA: Diagnosis not present

## 2016-05-02 DIAGNOSIS — R197 Diarrhea, unspecified: Secondary | ICD-10-CM | POA: Diagnosis not present

## 2016-05-02 DIAGNOSIS — R509 Fever, unspecified: Secondary | ICD-10-CM | POA: Diagnosis not present

## 2016-05-02 MED ORDER — AZACITIDINE CHEMO SQ INJECTION
75.0000 mg/m2 | Freq: Once | INTRAMUSCULAR | Status: AC
  Administered 2016-05-02: 165 mg via SUBCUTANEOUS
  Filled 2016-05-02: qty 6.6

## 2016-05-02 MED ORDER — ONDANSETRON HCL 4 MG PO TABS
8.0000 mg | ORAL_TABLET | Freq: Once | ORAL | Status: AC
  Administered 2016-05-02: 8 mg via ORAL
  Filled 2016-05-02: qty 2

## 2016-05-03 ENCOUNTER — Inpatient Hospital Stay: Payer: Medicare Other

## 2016-05-03 VITALS — BP 141/73 | HR 77 | Temp 96.6°F | Resp 20

## 2016-05-03 DIAGNOSIS — I129 Hypertensive chronic kidney disease with stage 1 through stage 4 chronic kidney disease, or unspecified chronic kidney disease: Secondary | ICD-10-CM | POA: Diagnosis not present

## 2016-05-03 DIAGNOSIS — Z87891 Personal history of nicotine dependence: Secondary | ICD-10-CM | POA: Diagnosis not present

## 2016-05-03 DIAGNOSIS — K5900 Constipation, unspecified: Secondary | ICD-10-CM | POA: Diagnosis not present

## 2016-05-03 DIAGNOSIS — Z7984 Long term (current) use of oral hypoglycemic drugs: Secondary | ICD-10-CM | POA: Diagnosis not present

## 2016-05-03 DIAGNOSIS — K219 Gastro-esophageal reflux disease without esophagitis: Secondary | ICD-10-CM | POA: Diagnosis not present

## 2016-05-03 DIAGNOSIS — G4730 Sleep apnea, unspecified: Secondary | ICD-10-CM | POA: Diagnosis not present

## 2016-05-03 DIAGNOSIS — M1990 Unspecified osteoarthritis, unspecified site: Secondary | ICD-10-CM | POA: Diagnosis not present

## 2016-05-03 DIAGNOSIS — Z87442 Personal history of urinary calculi: Secondary | ICD-10-CM | POA: Diagnosis not present

## 2016-05-03 DIAGNOSIS — E785 Hyperlipidemia, unspecified: Secondary | ICD-10-CM | POA: Diagnosis not present

## 2016-05-03 DIAGNOSIS — R5383 Other fatigue: Secondary | ICD-10-CM | POA: Diagnosis not present

## 2016-05-03 DIAGNOSIS — D46Z Other myelodysplastic syndromes: Secondary | ICD-10-CM

## 2016-05-03 DIAGNOSIS — M549 Dorsalgia, unspecified: Secondary | ICD-10-CM | POA: Diagnosis not present

## 2016-05-03 DIAGNOSIS — R197 Diarrhea, unspecified: Secondary | ICD-10-CM | POA: Diagnosis not present

## 2016-05-03 DIAGNOSIS — N189 Chronic kidney disease, unspecified: Secondary | ICD-10-CM | POA: Diagnosis not present

## 2016-05-03 DIAGNOSIS — R509 Fever, unspecified: Secondary | ICD-10-CM | POA: Diagnosis not present

## 2016-05-03 DIAGNOSIS — E1122 Type 2 diabetes mellitus with diabetic chronic kidney disease: Secondary | ICD-10-CM | POA: Diagnosis not present

## 2016-05-03 DIAGNOSIS — R531 Weakness: Secondary | ICD-10-CM | POA: Diagnosis not present

## 2016-05-03 DIAGNOSIS — D61818 Other pancytopenia: Secondary | ICD-10-CM | POA: Diagnosis not present

## 2016-05-03 DIAGNOSIS — D469 Myelodysplastic syndrome, unspecified: Secondary | ICD-10-CM | POA: Diagnosis not present

## 2016-05-03 DIAGNOSIS — Z79899 Other long term (current) drug therapy: Secondary | ICD-10-CM | POA: Diagnosis not present

## 2016-05-03 DIAGNOSIS — K1230 Oral mucositis (ulcerative), unspecified: Secondary | ICD-10-CM | POA: Diagnosis not present

## 2016-05-03 MED ORDER — ONDANSETRON HCL 4 MG PO TABS
8.0000 mg | ORAL_TABLET | Freq: Once | ORAL | Status: AC
  Administered 2016-05-03: 8 mg via ORAL
  Filled 2016-05-03: qty 2

## 2016-05-03 MED ORDER — AZACITIDINE CHEMO SQ INJECTION
75.0000 mg/m2 | Freq: Once | INTRAMUSCULAR | Status: AC
  Administered 2016-05-03: 165 mg via SUBCUTANEOUS
  Filled 2016-05-03: qty 2.6

## 2016-05-03 NOTE — Telephone Encounter (Addendum)
Patient is currently undergoing chemotherapy for myelodysplastic syndrome, she feels very weak and fatigued as a result of side effects of the monotherapy. She could not make it to Dr. Blane Ohara office. In any case, she has been treated for [facility] spotted fever with doxycycline.

## 2016-05-06 ENCOUNTER — Inpatient Hospital Stay: Payer: Medicare Other

## 2016-05-06 ENCOUNTER — Inpatient Hospital Stay (HOSPITAL_BASED_OUTPATIENT_CLINIC_OR_DEPARTMENT_OTHER): Payer: Medicare Other | Admitting: Internal Medicine

## 2016-05-06 VITALS — BP 137/78 | HR 94 | Temp 99.0°F | Resp 18 | Wt 241.4 lb

## 2016-05-06 DIAGNOSIS — D469 Myelodysplastic syndrome, unspecified: Secondary | ICD-10-CM | POA: Diagnosis not present

## 2016-05-06 DIAGNOSIS — K219 Gastro-esophageal reflux disease without esophagitis: Secondary | ICD-10-CM | POA: Diagnosis not present

## 2016-05-06 DIAGNOSIS — G4730 Sleep apnea, unspecified: Secondary | ICD-10-CM | POA: Diagnosis not present

## 2016-05-06 DIAGNOSIS — R531 Weakness: Secondary | ICD-10-CM

## 2016-05-06 DIAGNOSIS — K5900 Constipation, unspecified: Secondary | ICD-10-CM | POA: Diagnosis not present

## 2016-05-06 DIAGNOSIS — R630 Anorexia: Secondary | ICD-10-CM

## 2016-05-06 DIAGNOSIS — E1122 Type 2 diabetes mellitus with diabetic chronic kidney disease: Secondary | ICD-10-CM | POA: Diagnosis not present

## 2016-05-06 DIAGNOSIS — N189 Chronic kidney disease, unspecified: Secondary | ICD-10-CM

## 2016-05-06 DIAGNOSIS — I129 Hypertensive chronic kidney disease with stage 1 through stage 4 chronic kidney disease, or unspecified chronic kidney disease: Secondary | ICD-10-CM | POA: Diagnosis not present

## 2016-05-06 DIAGNOSIS — Z79899 Other long term (current) drug therapy: Secondary | ICD-10-CM | POA: Diagnosis not present

## 2016-05-06 DIAGNOSIS — E669 Obesity, unspecified: Secondary | ICD-10-CM

## 2016-05-06 DIAGNOSIS — M549 Dorsalgia, unspecified: Secondary | ICD-10-CM | POA: Diagnosis not present

## 2016-05-06 DIAGNOSIS — Z7984 Long term (current) use of oral hypoglycemic drugs: Secondary | ICD-10-CM | POA: Diagnosis not present

## 2016-05-06 DIAGNOSIS — R197 Diarrhea, unspecified: Secondary | ICD-10-CM | POA: Diagnosis not present

## 2016-05-06 DIAGNOSIS — R5383 Other fatigue: Secondary | ICD-10-CM

## 2016-05-06 DIAGNOSIS — E785 Hyperlipidemia, unspecified: Secondary | ICD-10-CM

## 2016-05-06 DIAGNOSIS — K1230 Oral mucositis (ulcerative), unspecified: Secondary | ICD-10-CM

## 2016-05-06 DIAGNOSIS — Z87442 Personal history of urinary calculi: Secondary | ICD-10-CM

## 2016-05-06 DIAGNOSIS — D46Z Other myelodysplastic syndromes: Secondary | ICD-10-CM

## 2016-05-06 DIAGNOSIS — D61818 Other pancytopenia: Secondary | ICD-10-CM | POA: Diagnosis not present

## 2016-05-06 DIAGNOSIS — R509 Fever, unspecified: Secondary | ICD-10-CM | POA: Diagnosis not present

## 2016-05-06 DIAGNOSIS — Z87891 Personal history of nicotine dependence: Secondary | ICD-10-CM | POA: Diagnosis not present

## 2016-05-06 DIAGNOSIS — M1990 Unspecified osteoarthritis, unspecified site: Secondary | ICD-10-CM

## 2016-05-06 LAB — BASIC METABOLIC PANEL
ANION GAP: 9 (ref 5–15)
BUN: 19 mg/dL (ref 6–20)
CALCIUM: 9.3 mg/dL (ref 8.9–10.3)
CO2: 26 mmol/L (ref 22–32)
CREATININE: 1.09 mg/dL — AB (ref 0.44–1.00)
Chloride: 101 mmol/L (ref 101–111)
GFR, EST AFRICAN AMERICAN: 57 mL/min — AB (ref >60)
GFR, EST NON AFRICAN AMERICAN: 49 mL/min — AB (ref >60)
GLUCOSE: 196 mg/dL — AB (ref 65–99)
Potassium: 4.3 mmol/L (ref 3.5–5.1)
Sodium: 136 mmol/L (ref 135–145)

## 2016-05-06 LAB — CBC WITH DIFFERENTIAL/PLATELET
BASOS ABS: 0 10*3/uL (ref 0–0.1)
Basophils Relative: 0 %
EOS ABS: 0 10*3/uL (ref 0–0.7)
HCT: 24.5 % — ABNORMAL LOW (ref 35.0–47.0)
HEMOGLOBIN: 8.5 g/dL — AB (ref 12.0–16.0)
LYMPHS ABS: 0.6 10*3/uL — AB (ref 1.0–3.6)
Lymphocytes Relative: 19 %
MCH: 34.5 pg — AB (ref 26.0–34.0)
MCHC: 34.5 g/dL (ref 32.0–36.0)
MCV: 99.9 fL (ref 80.0–100.0)
Monocytes Absolute: 0.5 10*3/uL (ref 0.2–0.9)
Monocytes Relative: 14 %
Neutro Abs: 2.2 10*3/uL (ref 1.4–6.5)
PLATELETS: 75 10*3/uL — AB (ref 150–440)
RBC: 2.45 MIL/uL — AB (ref 3.80–5.20)
RDW: 16.1 % — AB (ref 11.5–14.5)
WBC: 3.3 10*3/uL — AB (ref 3.6–11.0)

## 2016-05-06 MED ORDER — ONDANSETRON HCL 4 MG PO TABS
8.0000 mg | ORAL_TABLET | Freq: Once | ORAL | Status: AC
  Administered 2016-05-06: 8 mg via ORAL
  Filled 2016-05-06: qty 2

## 2016-05-06 MED ORDER — AZACITIDINE CHEMO SQ INJECTION
75.0000 mg/m2 | Freq: Once | INTRAMUSCULAR | Status: AC
  Administered 2016-05-06: 165 mg via SUBCUTANEOUS
  Filled 2016-05-06: qty 6.6

## 2016-05-06 NOTE — Progress Notes (Signed)
Patient states that she is only uses prune juice at home for constipation. She does not have any stool softeners at home.  Dr. Rogue Bussing instructed the patient to use miralax and dulcolax otc

## 2016-05-06 NOTE — Progress Notes (Signed)
Patient states she has severe constipation.  She has not been able to have BM in past 2 days.  States she was up all night trying to go to [place].  Asking for something to help her.  She has tried prune juice and suppositories without relief.  Further states her right leg "gives way on her".  She states this has been going on for past 3 days.

## 2016-05-06 NOTE — Progress Notes (Signed)
[place] NOTE  Patient Care Team: Roselee Nova, [name]  CHIEF COMPLAINTS/PURPOSE OF CONSULTATION:   Oncology History   #JUNE 2017- Severe neutropenia/ Anemia ~hb 9/platlets- 85-100 AUG 2017- REFRACTORY ANEMIA with EXCESS BLASTS [14% blasts- BMBx]; cytogenetics/FISH-N [SNP micorarray- not done]   # AUG 21st-  START AZA 35m/m2 day- 1-7 q 28 days.      MDS (myelodysplastic syndrome), high grade ([facility]   04/23/2016 Initial Diagnosis    MDS (myelodysplastic syndrome), high grade (HCC)         HISTORY OF PRESENTING ILLNESS:  [name]75y.o.  female with Recently diagnosed MDS/high-grade currently started on VFountainlast week is here for follow-up  Patient denies any nausea vomiting. Patient had 1 episode of diarrhea. However she is currently constipated for the last many days. She complains of mild soreness in the mouth. Otherwise denies any difficulty swallowing.  Patient continues to feel fatigued. Denies any new fevers. She feels weak. Poor appetite. No significant weight loss. Intermittent back pain. She denies any nosebleeds or gum bleeding.    ROS: A complete 10 point review of system is done which is negative except mentioned above in history of present illness  MEDICAL HISTORY:  Past Medical History:  Diagnosis Date  . Abdominal wall mass   . Anginal pain ([place]   . Anxiety   . Arthritis   . Calculus of kidney   . Cystitis   . Depression   . Diabetes mellitus without complication (HCC)    elevated A1c  . Dyspnea on exertion   . Elevated serum creatinine   . Fibrocystic breast disease   . GERD (gastroesophageal reflux disease)   . Hearing loss   . Heart murmur   . HTN (hypertension)   . Hyperlipidemia   . MDS (myelodysplastic syndrome), high grade ([place] 04/23/2016  . Microscopic hematuria   . Mucositis due to chemotherapy   . Obesity   . Risk for falls   . Sleep apnea   . Thrombocytopenia ([place]    . Urinary frequency   . Urinary urgency     SURGICAL HISTORY: Past Surgical History:  Procedure Laterality Date  . ABDOMINAL HYSTERECTOMY    . APPENDECTOMY    . CARDIAC CATHETERIZATION     x2  . CHOLECYSTECTOMY    . COLONOSCOPY N/A 02/24/2015   Procedure: COLONOSCOPY;  Surgeon: RManya Silvas MD;  [facility];  Service: Endoscopy;  Laterality: N/A;  . DIAGNOSTIC LAPAROSCOPY     Removal of benign abdominal tumor  . ESOPHAGOGASTRODUODENOSCOPY N/A 02/24/2015   Procedure: ESOPHAGOGASTRODUODENOSCOPY (EGD);  Surgeon: RManya Silvas MD;  [facility];  Service: Endoscopy;  Laterality: N/A;  . right eye surgery Right     SOCIAL HISTORY: [place] family[place] No smoking/ no alcohol.  Social History   Social History  . Marital status: Married    Spouse name: N/A  . Number of children: N/A  . Years of education: N/A   Occupational History  . Not on file.   Social History Main Topics  . Smoking status: Former Smoker    Quit date: 09/09/1988  . Smokeless tobacco: Never Used     Comment: quit 25 years ago  . Alcohol use No  . Drug use: No  . Sexual activity: Not on file   Other Topics Concern  . Not on file   Social History Narrative  . No  narrative on file    FAMILY HISTORY: no cancers in family.  Family History  Problem Relation Age of Onset  . Congestive Heart Failure Mother   . Diabetes Mother   . Coronary artery disease Mother   . Stroke Mother   . Cirrhosis Father     ALLERGIES:  is allergic to macrobid [nitrofurantoin monohyd macro].  MEDICATIONS:  Current Outpatient Prescriptions  Medication Sig Dispense Refill  . bisoprolol-hydrochlorothiazide (ZIAC) 5-6.25 MG tablet TAKE 1 TABLET BY MOUTH DAILY 90 tablet 0  . buPROPion (WELLBUTRIN XL) 150 MG 24 hr tablet Take 300 mg by mouth daily.    . clonazePAM (KLONOPIN) 0.5 MG tablet Take 0.5 mg by mouth 2 (two) times daily.     Marland Kitchen lisinopril  (PRINIVIL,ZESTRIL) 5 MG tablet TAKE 1 TABLET BY MOUTH DAILY 90 tablet 0  . LUMIGAN 0.01 % SOLN     . magic mouthwash w/lidocaine SOLN Take 5 mLs by mouth 4 (four) times daily. 80 ml viscous lidocaine 2%, 80 ml Mylanta, 80 ml Diphenhydramine 12.5 mg/5 ml Elixir, 80 ml Nystatin 100,000 Unit suspension, 80 ml Prednisolone 15 mg/52m, 80 ml Distilled Water. Sig: Swish/Swallow 5-10 ml four times a day as needed. Dispense 480 ml. 3RFs 480 mL 3  . metFORMIN (GLUCOPHAGE) 500 MG tablet TAKE 1 TABLET BY MOUTH DAILY 90 tablet 0  . Multiple Vitamin (MULTIVITAMIN WITH MINERALS) TABS tablet Take 1 tablet by mouth daily.    . Omega-3 Fatty Acids (FISH OIL) 1000 MG CAPS Take 1 capsule by mouth daily.    . ondansetron (ZOFRAN) 8 MG tablet Take 1 tablet (8 mg total) by mouth 2 (two) times daily as needed (Nausea or vomiting). 30 tablet 1  . prochlorperazine (COMPAZINE) 10 MG tablet Take 1 tablet (10 mg total) by mouth every 6 (six) hours as needed (Nausea or vomiting). 30 tablet 1  . rosuvastatin (CRESTOR) 20 MG tablet Take 1 tablet (20 mg total) by mouth daily. 90 tablet 1  . sertraline (ZOLOFT) 100 MG tablet Take 100 mg by mouth daily.     .Marland KitchenUmeclidinium-Vilanterol (ANORO ELLIPTA) 62.5-25 MCG/INH AEPB Inhale 1 puff into the lungs daily as needed.    . valACYclovir (VALTREX) 1000 MG tablet Take 1 tablet (1,000 mg total) by mouth 2 (two) times daily. 2 tablet 0  . lidocaine (XYLOCAINE) 2 % solution      No current facility-administered medications for this visit.       .Marland Kitchen PHYSICAL EXAMINATION: ECOG PERFORMANCE STATUS: 1 - Symptomatic but completely ambulatory  Vitals:   05/06/16 1058  BP: 137/78  Pulse: 94  Resp: 18  Temp: 99 F (37.2 C)   Filed Weights   05/06/16 1058  Weight: 241 lb 7 oz (109.5 kg)    GENERAL: Well-nourished well-developed; Alert, no distress and comfortable.   Obese. Accompanied by her sister-in-law/husband. In a wheel chair.  EYES: Positive for pallor. OROPHARYNX: no thrush  or ulceration;  NECK: supple, no masses felt LYMPH:  no palpable lymphadenopathy in the cervical, axillary or inguinal regions LUNGS: clear to auscultation and  No wheeze or crackles HEART/CVS: regular rate & rhythm and no murmurs; No lower extremity edema ABDOMEN: abdomen soft, non-tender and normal bowel sounds Musculoskeletal:no cyanosis of digits and no clubbing  PSYCH: alert & oriented x 3 with fluent speech NEURO: no focal motor/sensory deficits SKIN:  no rashes or significant lesions  LABORATORY DATA:  I have reviewed the data as listed Lab Results  Component Value Date   WBC  3.3 (L) 05/06/2016   HGB 8.5 (L) 05/06/2016   HCT 24.5 (L) 05/06/2016   MCV 99.9 05/06/2016   PLT 75 (L) 05/06/2016    Recent Labs  04/09/16 1532 04/23/16 1527 05/06/16 1028  NA 139 140 136  K 3.8 4.4 4.3  CL 104 105 101  CO2 _0 GLUCOSE 101* 117* 196*  BUN 25* 21* 19  CREATININE 1.18* 1.10* 1.09*  CALCIUM 9.3 9.2 9.3  GFRNONAA 44* 48* 49*  GFRAA 51* 56* 57*  PROT 7.9 7.6  --   ALBUMIN 4.0 3.9  --   AST 22 20  --   ALT 19 19  --   ALKPHOS 64 61  --   BILITOT 0.6 0.3  --     RADIOGRAPHIC STUDIES: I have personally reviewed the radiological images as listed and agreed with the findings in the report. Ct Biopsy  Result Date: 04/17/2016 INDICATION: 75 year old female with pancytopenia EXAM: CT GUIDED BONE MARROW ASPIRATION AND CORE BIOPSY Interventional Radiologist:  Criselda Peaches, MD MEDICATIONS: None. ANESTHESIA/SEDATION: Moderate (conscious) sedation was employed during this procedure. A total of 3 milligrams versed and 50 micrograms fentanyl were administered intravenously. The patient's level of consciousness and vital signs were monitored continuously by radiology nursing throughout the procedure under my direct supervision. Total monitored sedation time: 10 minutes FLUOROSCOPY TIME:  None COMPLICATIONS: None immediate. Estimated blood loss: <25 mL PROCEDURE: Informed written  consent was obtained from the patient after a thorough discussion of the procedural risks, benefits and alternatives. All questions were addressed. Maximal Sterile Barrier Technique was utilized including caps, mask, sterile gowns, sterile gloves, sterile drape, hand hygiene and skin antiseptic. A timeout was performed prior to the initiation of the procedure. The patient was positioned prone and non-contrast localization CT was performed of the pelvis to demonstrate the iliac marrow spaces. Maximal barrier sterile technique utilized including caps, mask, sterile gowns, sterile gloves, large sterile drape, hand hygiene, and betadine prep. Under sterile conditions and local anesthesia, an 11 gauge coaxial bone biopsy needle was advanced into the right iliac marrow space. Needle position was confirmed with CT imaging. Initially, bone marrow aspiration was performed. Next, the 11 gauge outer cannula was utilized to obtain a right iliac bone marrow core biopsy. Needle was removed. Hemostasis was obtained with compression. The patient tolerated the procedure well. Samples were prepared with the cytotechnologist. IMPRESSION: Technically successful right iliac bone marrow biopsy. Electronically Signed   By: Jacqulynn Cadet M.D.   On: 04/17/2016 10:37    ASSESSMENT & PLAN:   MDS (myelodysplastic syndrome), high grade (HCC) Refractory anemia- with excess blasts- II [blasts percentage 14%] FISH- normal karyotype. On vidaza  cycle #1 day #6 today. Tolerating well except for constipation. Refer to [facility] for transplant evaluation.   # Mucositis-G-1.  on magic mouth wash.   # constipation- miralax/ dulcolax.   # follow up on 9/18 to start #2. Weekly cbc/hold tube.    All questions were answered. The patient knows to call the clinic with any problems, questions or concerns. Refer pt to [facility] for transplant evaluation.      Cammie Sickle, MD 05/06/2016 1:28 PM

## 2016-05-06 NOTE — Patient Instructions (Signed)

## 2016-05-06 NOTE — Assessment & Plan Note (Addendum)
Refractory anemia- with excess blasts- II [blasts percentage 14%] FISH- normal karyotype. On vidaza  cycle #1 day #6 today. Tolerating well except for constipation. Refer to [facility] for transplant evaluation.   # Mucositis-G-1.  on magic mouth wash.   # constipation- miralax/ dulcolax.   # follow up on 9/18 to start #2. Weekly cbc/hold tube.

## 2016-05-07 ENCOUNTER — Inpatient Hospital Stay: Payer: Medicare Other

## 2016-05-07 ENCOUNTER — Inpatient Hospital Stay
Admission: EM | Admit: 2016-05-07 | Discharge: 2016-05-10 | DRG: 393 | Disposition: A | Discharge status: Home / Self Care | Payer: Medicare Other | Attending: Specialist | Admitting: Specialist

## 2016-05-07 ENCOUNTER — Emergency Department: Payer: Medicare Other

## 2016-05-07 ENCOUNTER — Encounter (HOSPITAL_COMMUNITY): Payer: Self-pay

## 2016-05-07 ENCOUNTER — Encounter: Payer: Self-pay | Admitting: *Deleted

## 2016-05-07 ENCOUNTER — Telehealth: Payer: Self-pay | Admitting: *Deleted

## 2016-05-07 VITALS — BP 109/65 | HR 101 | Temp 99.7°F | Resp 20

## 2016-05-07 DIAGNOSIS — Z87442 Personal history of urinary calculi: Secondary | ICD-10-CM | POA: Diagnosis not present

## 2016-05-07 DIAGNOSIS — G4730 Sleep apnea, unspecified: Secondary | ICD-10-CM | POA: Diagnosis not present

## 2016-05-07 DIAGNOSIS — I129 Hypertensive chronic kidney disease with stage 1 through stage 4 chronic kidney disease, or unspecified chronic kidney disease: Secondary | ICD-10-CM | POA: Diagnosis not present

## 2016-05-07 DIAGNOSIS — K529 Noninfective gastroenteritis and colitis, unspecified: Secondary | ICD-10-CM | POA: Diagnosis not present

## 2016-05-07 DIAGNOSIS — Z8249 Family history of ischemic heart disease and other diseases of the circulatory system: Secondary | ICD-10-CM

## 2016-05-07 DIAGNOSIS — Z7984 Long term (current) use of oral hypoglycemic drugs: Secondary | ICD-10-CM

## 2016-05-07 DIAGNOSIS — K649 Unspecified hemorrhoids: Secondary | ICD-10-CM | POA: Diagnosis present

## 2016-05-07 DIAGNOSIS — R5383 Other fatigue: Secondary | ICD-10-CM | POA: Diagnosis not present

## 2016-05-07 DIAGNOSIS — D46Z Other myelodysplastic syndromes: Secondary | ICD-10-CM

## 2016-05-07 DIAGNOSIS — K6289 Other specified diseases of anus and rectum: Principal | ICD-10-CM | POA: Diagnosis present

## 2016-05-07 DIAGNOSIS — Z833 Family history of diabetes mellitus: Secondary | ICD-10-CM

## 2016-05-07 DIAGNOSIS — E119 Type 2 diabetes mellitus without complications: Secondary | ICD-10-CM | POA: Diagnosis not present

## 2016-05-07 DIAGNOSIS — K1230 Oral mucositis (ulcerative), unspecified: Secondary | ICD-10-CM | POA: Diagnosis not present

## 2016-05-07 DIAGNOSIS — N189 Chronic kidney disease, unspecified: Secondary | ICD-10-CM | POA: Diagnosis not present

## 2016-05-07 DIAGNOSIS — Z823 Family history of stroke: Secondary | ICD-10-CM

## 2016-05-07 DIAGNOSIS — R509 Fever, unspecified: Secondary | ICD-10-CM | POA: Diagnosis not present

## 2016-05-07 DIAGNOSIS — K219 Gastro-esophageal reflux disease without esophagitis: Secondary | ICD-10-CM | POA: Diagnosis not present

## 2016-05-07 DIAGNOSIS — Z79899 Other long term (current) drug therapy: Secondary | ICD-10-CM

## 2016-05-07 DIAGNOSIS — D469 Myelodysplastic syndrome, unspecified: Secondary | ICD-10-CM | POA: Diagnosis not present

## 2016-05-07 DIAGNOSIS — H9190 Unspecified hearing loss, unspecified ear: Secondary | ICD-10-CM | POA: Diagnosis not present

## 2016-05-07 DIAGNOSIS — M1990 Unspecified osteoarthritis, unspecified site: Secondary | ICD-10-CM | POA: Diagnosis not present

## 2016-05-07 DIAGNOSIS — J449 Chronic obstructive pulmonary disease, unspecified: Secondary | ICD-10-CM | POA: Diagnosis not present

## 2016-05-07 DIAGNOSIS — E785 Hyperlipidemia, unspecified: Secondary | ICD-10-CM | POA: Diagnosis not present

## 2016-05-07 DIAGNOSIS — Z87891 Personal history of nicotine dependence: Secondary | ICD-10-CM

## 2016-05-07 DIAGNOSIS — I10 Essential (primary) hypertension: Secondary | ICD-10-CM | POA: Diagnosis present

## 2016-05-07 DIAGNOSIS — D6959 Other secondary thrombocytopenia: Secondary | ICD-10-CM | POA: Diagnosis present

## 2016-05-07 DIAGNOSIS — F419 Anxiety disorder, unspecified: Secondary | ICD-10-CM | POA: Diagnosis present

## 2016-05-07 DIAGNOSIS — I2510 Atherosclerotic heart disease of native coronary artery without angina pectoris: Secondary | ICD-10-CM | POA: Diagnosis not present

## 2016-05-07 DIAGNOSIS — R140 Abdominal distension (gaseous): Secondary | ICD-10-CM

## 2016-05-07 DIAGNOSIS — D61810 Antineoplastic chemotherapy induced pancytopenia: Secondary | ICD-10-CM | POA: Diagnosis present

## 2016-05-07 DIAGNOSIS — K5900 Constipation, unspecified: Secondary | ICD-10-CM | POA: Diagnosis present

## 2016-05-07 DIAGNOSIS — E1122 Type 2 diabetes mellitus with diabetic chronic kidney disease: Secondary | ICD-10-CM | POA: Diagnosis not present

## 2016-05-07 DIAGNOSIS — F329 Major depressive disorder, single episode, unspecified: Secondary | ICD-10-CM | POA: Diagnosis present

## 2016-05-07 DIAGNOSIS — R197 Diarrhea, unspecified: Secondary | ICD-10-CM | POA: Diagnosis not present

## 2016-05-07 DIAGNOSIS — M549 Dorsalgia, unspecified: Secondary | ICD-10-CM | POA: Diagnosis not present

## 2016-05-07 DIAGNOSIS — D61818 Other pancytopenia: Secondary | ICD-10-CM | POA: Diagnosis not present

## 2016-05-07 DIAGNOSIS — R531 Weakness: Secondary | ICD-10-CM | POA: Diagnosis not present

## 2016-05-07 LAB — CBC
HEMATOCRIT: 26.2 % — AB (ref 35.0–47.0)
Hemoglobin: 9.1 g/dL — ABNORMAL LOW (ref 12.0–16.0)
MCH: 35.2 pg — AB (ref 26.0–34.0)
MCHC: 34.8 g/dL (ref 32.0–36.0)
MCV: 101.2 fL — AB (ref 80.0–100.0)
Platelets: 90 10*3/uL — ABNORMAL LOW (ref 150–440)
RBC: 2.59 MIL/uL — ABNORMAL LOW (ref 3.80–5.20)
RDW: 16.4 % — AB (ref 11.5–14.5)
WBC: 7.1 10*3/uL (ref 3.6–11.0)

## 2016-05-07 LAB — BASIC METABOLIC PANEL
ANION GAP: 8 (ref 5–15)
BUN: 19 mg/dL (ref 6–20)
CO2: 25 mmol/L (ref 22–32)
CREATININE: 1.22 mg/dL — AB (ref 0.44–1.00)
Calcium: 9.6 mg/dL (ref 8.9–10.3)
Chloride: 103 mmol/L (ref 101–111)
GFR calc non Af Amer: 43 mL/min — ABNORMAL LOW (ref >60)
GFR, EST AFRICAN AMERICAN: 49 mL/min — AB (ref >60)
GLUCOSE: 201 mg/dL — AB (ref 65–99)
POTASSIUM: 4.4 mmol/L (ref 3.5–5.1)
Sodium: 136 mmol/L (ref 135–145)

## 2016-05-07 LAB — LIPASE, BLOOD: LIPASE: 72 U/L — AB (ref 11–51)

## 2016-05-07 LAB — HEPATIC FUNCTION PANEL
ALT: 25 U/L (ref 14–54)
AST: 26 U/L (ref 15–41)
Albumin: 3.8 g/dL (ref 3.5–5.0)
Alkaline Phosphatase: 65 U/L (ref 38–126)
BILIRUBIN DIRECT: 0.1 mg/dL (ref 0.1–0.5)
BILIRUBIN INDIRECT: 0.5 mg/dL (ref 0.3–0.9)
Total Bilirubin: 0.6 mg/dL (ref 0.3–1.2)
Total Protein: 8.3 g/dL — ABNORMAL HIGH (ref 6.5–8.1)

## 2016-05-07 LAB — TROPONIN I

## 2016-05-07 LAB — GLUCOSE, CAPILLARY: Glucose-Capillary: 154 mg/dL — ABNORMAL HIGH (ref 65–99)

## 2016-05-07 MED ORDER — ONDANSETRON HCL 4 MG PO TABS
8.0000 mg | ORAL_TABLET | Freq: Once | ORAL | Status: AC
  Administered 2016-05-07: 8 mg via ORAL
  Filled 2016-05-07: qty 2

## 2016-05-07 MED ORDER — BUPROPION HCL ER (XL) 150 MG PO TB24
300.0000 mg | ORAL_TABLET | Freq: Every day | ORAL | Status: DC
  Administered 2016-05-08 – 2016-05-10 (×3): 300 mg via ORAL
  Filled 2016-05-07 (×3): qty 2

## 2016-05-07 MED ORDER — INSULIN ASPART 100 UNIT/ML ~~LOC~~ SOLN
0.0000 [IU] | Freq: Four times a day (QID) | SUBCUTANEOUS | Status: DC
  Administered 2016-05-08: 07:00:00 1 [IU] via SUBCUTANEOUS
  Administered 2016-05-08: 2 [IU] via SUBCUTANEOUS
  Administered 2016-05-08: 18:00:00 3 [IU] via SUBCUTANEOUS
  Administered 2016-05-08 – 2016-05-09 (×2): 2 [IU] via SUBCUTANEOUS
  Administered 2016-05-09 – 2016-05-10 (×3): 1 [IU] via SUBCUTANEOUS
  Filled 2016-05-07 (×3): qty 1
  Filled 2016-05-07: qty 3
  Filled 2016-05-07 (×3): qty 2
  Filled 2016-05-07: qty 1

## 2016-05-07 MED ORDER — MORPHINE SULFATE (PF) 4 MG/ML IV SOLN
4.0000 mg | Freq: Once | INTRAVENOUS | Status: AC
  Administered 2016-05-07: 4 mg via INTRAVENOUS

## 2016-05-07 MED ORDER — ACETAMINOPHEN 650 MG RE SUPP: 650.0000 mg | Freq: Four times a day (QID) | RECTAL | Status: DC | PRN

## 2016-05-07 MED ORDER — ENOXAPARIN SODIUM 40 MG/0.4ML ~~LOC~~ SOLN
40.0000 mg | Freq: Two times a day (BID) | SUBCUTANEOUS | Status: DC
  Administered 2016-05-08: 40 mg via SUBCUTANEOUS
  Filled 2016-05-07: qty 0.4

## 2016-05-07 MED ORDER — CLONAZEPAM 0.5 MG PO TABS
0.5000 mg | ORAL_TABLET | Freq: Two times a day (BID) | ORAL | Status: DC
  Administered 2016-05-08 – 2016-05-10 (×6): 0.5 mg via ORAL
  Filled 2016-05-07 (×6): qty 1

## 2016-05-07 MED ORDER — ONDANSETRON HCL 4 MG/2ML IJ SOLN
4.0000 mg | Freq: Once | INTRAMUSCULAR | Status: AC
  Administered 2016-05-07: 4 mg via INTRAVENOUS

## 2016-05-07 MED ORDER — CIPROFLOXACIN IN D5W 400 MG/200ML IV SOLN
400.0000 mg | Freq: Two times a day (BID) | INTRAVENOUS | Status: DC
  Administered 2016-05-08: 02:00:00 400 mg via INTRAVENOUS
  Filled 2016-05-07 (×3): qty 200

## 2016-05-07 MED ORDER — ONDANSETRON HCL 4 MG/2ML IJ SOLN
INTRAMUSCULAR | Status: AC
Start: 2016-05-07 — End: 2016-05-07
  Administered 2016-05-07: 4 mg via INTRAVENOUS
  Filled 2016-05-07: qty 2

## 2016-05-07 MED ORDER — ROSUVASTATIN CALCIUM 20 MG PO TABS
20.0000 mg | ORAL_TABLET | Freq: Every day | ORAL | Status: DC
  Administered 2016-05-08 – 2016-05-10 (×3): 20 mg via ORAL
  Filled 2016-05-07 (×3): qty 1

## 2016-05-07 MED ORDER — SERTRALINE HCL 50 MG PO TABS
100.0000 mg | ORAL_TABLET | Freq: Every day | ORAL | Status: DC
  Administered 2016-05-08 – 2016-05-10 (×3): 100 mg via ORAL
  Filled 2016-05-07 (×3): qty 2

## 2016-05-07 MED ORDER — ONDANSETRON HCL 4 MG/2ML IJ SOLN
4.0000 mg | Freq: Four times a day (QID) | INTRAMUSCULAR | Status: DC | PRN
Start: 2016-05-07 — End: 2016-05-10

## 2016-05-07 MED ORDER — VALACYCLOVIR HCL 500 MG PO TABS
1000.0000 mg | ORAL_TABLET | Freq: Two times a day (BID) | ORAL | Status: DC
  Administered 2016-05-08: 02:00:00 1000 mg via ORAL
  Filled 2016-05-07 (×2): qty 2

## 2016-05-07 MED ORDER — ACETAMINOPHEN 325 MG PO TABS
650.0000 mg | ORAL_TABLET | Freq: Four times a day (QID) | ORAL | Status: DC | PRN
  Administered 2016-05-08 – 2016-05-10 (×4): 650 mg via ORAL
  Filled 2016-05-07 (×4): qty 2

## 2016-05-07 MED ORDER — IOPAMIDOL (ISOVUE-300) INJECTION 61%: 30.0000 mL | Freq: Once | INTRAVENOUS | Status: DC | PRN

## 2016-05-07 MED ORDER — OXYCODONE HCL 5 MG PO TABS
5.0000 mg | ORAL_TABLET | ORAL | Status: DC | PRN
  Administered 2016-05-08: 18:00:00 5 mg via ORAL
  Filled 2016-05-07: qty 1

## 2016-05-07 MED ORDER — SODIUM CHLORIDE 0.9 % IV SOLN
INTRAVENOUS | Status: DC
  Administered 2016-05-08: via INTRAVENOUS

## 2016-05-07 MED ORDER — UMECLIDINIUM-VILANTEROL 62.5-25 MCG/INH IN AEPB: 1.0000 | INHALATION_SPRAY | Freq: Every day | RESPIRATORY_TRACT | Status: DC | PRN

## 2016-05-07 MED ORDER — IOPAMIDOL (ISOVUE-300) INJECTION 61%
75.0000 mL | Freq: Once | INTRAVENOUS | Status: AC | PRN
  Administered 2016-05-07: 75 mL via INTRAVENOUS

## 2016-05-07 MED ORDER — ONDANSETRON HCL 4 MG PO TABS: 4.0000 mg | ORAL_TABLET | Freq: Four times a day (QID) | ORAL | Status: DC | PRN

## 2016-05-07 MED ORDER — AZACITIDINE CHEMO SQ INJECTION
75.0000 mg/m2 | Freq: Once | INTRAMUSCULAR | Status: AC
  Administered 2016-05-07: 165 mg via SUBCUTANEOUS
  Filled 2016-05-07: qty 6.6

## 2016-05-07 MED ORDER — MORPHINE SULFATE (PF) 4 MG/ML IV SOLN
INTRAVENOUS | Status: AC
  Administered 2016-05-07: 4 mg via INTRAVENOUS
  Filled 2016-05-07: qty 1

## 2016-05-07 MED ORDER — METRONIDAZOLE IN NACL 5-0.79 MG/ML-% IV SOLN
500.0000 mg | Freq: Three times a day (TID) | INTRAVENOUS | Status: DC
  Administered 2016-05-08 (×2): 500 mg via INTRAVENOUS
  Filled 2016-05-07 (×4): qty 100

## 2016-05-07 NOTE — ED Notes (Signed)
Pt had BM on self and bed. Cleaned pt and changed bedding. Assisted pt to bathroom.

## 2016-05-07 NOTE — Telephone Encounter (Signed)
Called complaining of severe constipation, she had called earlier this morning and was advised to drink Mag Citrate. She reports that she drank it at noon and still has not been able to go and it is hurting so bad she about passed out  And just vomited due to the pain.   I spoke with Dr Rogue Bussing who advises she go to the ER for evaluation. Patient informed of this and agrees to go to ER.

## 2016-05-07 NOTE — ED Notes (Signed)
Pt reports having severe 9/10 anal pain with hemorrhoids after large BM using mag citrate. No bleeding reported.

## 2016-05-07 NOTE — ED Notes (Signed)
Pt has had 3 different diarrhea episodes in bed and is now reporting nausea from drinking contrast. CT informed and pt will be taken to CT with 1.5 bottles of contrast completed.

## 2016-05-07 NOTE — Progress Notes (Signed)
Pharmacy Antibiotic Note  JESSICAANN KOZYRA is a 75 y.o. female admitted on 05/07/2016 with IAI/proctitis.  Pharmacy has been consulted for ciprofloxacin dosing.  Plan: Ciprofloxacin 400 mg IV Q12H  Height: 5\' 5"  (165.1 cm) Weight: 241 lb (109.3 kg) IBW/kg (Calculated) : 57  Temp (24hrs), Avg:99.3 F (37.4 C), Min:98.7 F (37.1 C), Max:99.7 F (37.6 C)   Recent Labs Lab 05/06/16 1028 05/07/16 1646  WBC 3.3* 7.1  CREATININE 1.09* 1.22*    Estimated Creatinine Clearance: 49.8 mL/min (by C-G formula based on SCr of 1.22 mg/dL).    Allergies  Allergen Reactions  . Macrobid [employer] Macro] Other (See Comments)    Reaction: unknown   Thank you for allowing pharmacy to be a part of this patient's care.  Laural Benes, Pharm.D., BCPS Clinical Pharmacist 05/07/2016 11:44 PM

## 2016-05-07 NOTE — ED Notes (Signed)
This RN called lab in regards to added blood work.

## 2016-05-07 NOTE — ED Triage Notes (Signed)
Pt to triage via wheelchair.  Pt has constipation for 3 days.  Pt reports abd pain.  Decreased appetite.    Pt had chemo today.  Pt reports she has bone marrow cancer.  Pt treated at [facility].  Pt alert.  Speech clear.

## 2016-05-07 NOTE — ED Provider Notes (Signed)
[facility] Emergency Department Provider Note   ____________________________________________   First MD Initiated Contact with Patient 05/07/16 1845     (approximate)  I have reviewed the triage vital signs and the nursing notes.   HISTORY  Chief Complaint Constipation    HPI Teresa Carrillo is a 75 y.o. female patient reports not having had a stool for 3 days is having abdominal pain. Patient is having a new diagnosis of bone marrow cancer and is getting shots in her abdomen. Her abdomen is generally achy and she was feeling nauseated. Patient apparently took a medium citrate before coming in and had has had a big bowel movement here and is now having diarrhea   Past Medical History:  Diagnosis Date  . Abdominal wall mass   . Anginal pain ([place])   . Anxiety   . Arthritis   . Calculus of kidney   . Cystitis   . Depression   . Diabetes mellitus without complication (HCC)    elevated A1c  . Dyspnea on exertion   . Elevated serum creatinine   . Fibrocystic breast disease   . GERD (gastroesophageal reflux disease)   . Hearing loss   . Heart murmur   . HTN (hypertension)   . Hyperlipidemia   . MDS (myelodysplastic syndrome), high grade ([place]) 04/23/2016  . Microscopic hematuria   . Mucositis due to chemotherapy   . Obesity   . Risk for falls   . Sleep apnea   . Thrombocytopenia ([place])   . Urinary frequency   . Urinary urgency     Patient Active Problem List   Diagnosis Date Noted  . Proctitis 05/07/2016  . MDS (myelodysplastic syndrome), high grade ([place]) 04/23/2016  . Oral mucosal lesion 04/04/2016  . Anemia 02/21/2016  . Pancytopenia ([place]) 02/21/2016  . Tick-borne disease 02/21/2016  . Cough 02/19/2016  . Proteinuria 05/10/2015  . Microscopic hematuria 05/08/2015  . Renal stone 05/08/2015  . Hyperlipidemia 05/05/2015  . HTN (hypertension) 05/05/2015  . Arteriosclerosis of coronary artery 02/02/2015  . H/O elevated lipids 02/02/2015    . H/O: HTN (hypertension) 02/02/2015  . Diabetes mellitus, type 2 ([place]) 02/02/2015  . COPD, mild ([place]) 05/02/2014  . Breathlessness on exertion 05/02/2014  . Abnormal respiratory rate 05/02/2014    Past Surgical History:  Procedure Laterality Date  . ABDOMINAL HYSTERECTOMY    . APPENDECTOMY    . CARDIAC CATHETERIZATION     x2  . CHOLECYSTECTOMY    . COLONOSCOPY N/A 02/24/2015   Procedure: COLONOSCOPY;  Surgeon: Manya Silvas, MD;  [facility];  Service: Endoscopy;  Laterality: N/A;  . DIAGNOSTIC LAPAROSCOPY     Removal of benign abdominal tumor  . ESOPHAGOGASTRODUODENOSCOPY N/A 02/24/2015   Procedure: ESOPHAGOGASTRODUODENOSCOPY (EGD);  Surgeon: Manya Silvas, MD;  Location[facility] END[facility]COPY;  Service: Endoscopy;  Laterality: N/A;  . right eye surgery Right     Prior to Admission medications   Medication Sig Start Date End Date Taking? Authorizing Provider  buPROPion (WELLBUTRIN XL) 150 MG 24 hr tablet Take 300 mg by mouth daily at 12 noon.    Yes Historical Provider, MD  clonazePAM (KLONOPIN) 0.5 MG tablet Take 0.5 mg by mouth 2 (two) times daily.    Yes Historical Provider, MD  LUMIGAN 0.01 % SOLN Place 1 drop into both eyes at bedtime.  04/12/15  Yes Historical Provider, MD  magic mouthwash w/lidocaine SOLN Take 5 mLs by mouth 4 (four) times daily. 80 ml viscous lidocaine 2%,  80 ml Mylanta, 80 ml Diphenhydramine 12.5 mg/5 ml Elixir, 80 ml Nystatin 100,000 Unit suspension, 80 ml Prednisolone 15 mg/16ml, 80 ml Distilled Water. Sig: Swish/Swallow 5-10 ml four times a day as needed. Dispense 480 ml. 3RFs 05/01/16  Yes Cammie Sickle, MD  metFORMIN (GLUCOPHAGE) 500 MG tablet TAKE 1 TABLET BY MOUTH DAILY 03/21/16  Yes Roselee Nova, MD  Multiple Vitamin (MULTIVITAMIN WITH MINERALS) TABS tablet Take 1 tablet by mouth daily.   Yes Historical Provider, MD  Omega-3 Fatty Acids (FISH OIL) 1000 MG CAPS Take 1 capsule by mouth daily.   Yes Historical Provider, MD   ondansetron (ZOFRAN) 8 MG tablet Take 1 tablet (8 mg total) by mouth 2 (two) times daily as needed (Nausea or vomiting). 04/23/16  Yes Cammie Sickle, MD  prochlorperazine (COMPAZINE) 10 MG tablet Take 1 tablet (10 mg total) by mouth every 6 (six) hours as needed (Nausea or vomiting). 04/23/16  Yes Cammie Sickle, MD  rosuvastatin (CRESTOR) 20 MG tablet Take 1 tablet (20 mg total) by mouth daily. 02/21/16  Yes Roselee Nova, MD  sertraline (ZOLOFT) 100 MG tablet Take 100 mg by mouth daily at 12 noon.    Yes Historical Provider, MD  Umeclidinium-Vilanterol (ANORO ELLIPTA) 62.5-25 MCG/INH AEPB Inhale 1 puff into the lungs daily as needed (shortness of breath).  05/17/14  Yes Historical Provider, MD    Allergies Macrobid [nitrofurantoin monohyd macro]  Family History  Problem Relation Age of Onset  . Congestive Heart Failure Mother   . Diabetes Mother   . Coronary artery disease Mother   . Stroke Mother   . Cirrhosis Father     Social History Social History  Substance Use Topics  . Smoking status: Former Smoker    Quit date: 09/09/1988  . Smokeless tobacco: Never Used     Comment: quit 25 years ago  . Alcohol use No    Review of Systems Constitutional: No fever/chills Eyes: No visual changes. ENT: No sore throat. Cardiovascular: Denies chest pain. Respiratory: Denies shortness of breath. Gastrointestina abdominal pain.  No nausea, no vomiting.  No diarrhea.   constipation. Genitourinary: Negative for dysuria. Musculoskeletal: Negative for back pain. Skin: Negative for rash. Neurological: Negative for headaches, focal weakness or numbness.  10-point ROS otherwise negative.  ____________________________________________   PHYSICAL EXAM:  VITAL SIGNS: ED Triage Vitals  Enc Vitals Group     BP 05/07/16 1640 (!) 120/59     Pulse Rate 05/07/16 1640 91     Resp 05/07/16 1640 (!) 22     Temp 05/07/16 1640 98.7 F (37.1 C)     Temp Source 05/07/16 1640 Oral      SpO2 05/07/16 1640 96 %     Weight 05/07/16 1641 241 lb (109.3 kg)     Height 05/07/16 1641 5\' 5"  (1.651 m)     Head Circumference --      Peak Flow --      Pain Score 05/07/16 1641 9     Pain Loc --      Pain Edu? --      Excl. in [place]? --     Constitutional: Alert and oriented. Well appearing and in no acute distress. Eyes: Conjunctivae are normal. PERRL. EOMI. Head: Atraumatic. Nose: No congestion/rhinnorhea. Mouth/Throat: Mucous membranes are moist.  Oropharynx non-erythematous. Neck: No stridor.   Cardiovascular: Normal rate, regular rhythm. Grossly normal heart sounds.  Good peripheral circulation. Respiratory: Normal respiratory effort.  No retractions. Lungs CTAB. Gastrointestinal:  Large diffusely tender No distention. No abdominal bruits. No CVA tenderness. Rectal: Old hemorrhoids no thrombosed hemorrhoids patient's currently having diarrhea there is no stool in the rectum although the rectum is tender. Musculoskeletal: No lower extremity tenderness nor edema.  No joint effusions. Neurologic:  Normal speech and language. No gross focal neurologic deficits are appreciated. No gait instability. Skin:  Skin is warm, dry and intact. No rash noted.   ____________________________________________   LABS (all labs ordered are listed, but only abnormal results are displayed)  Labs Reviewed  BASIC METABOLIC PANEL - Abnormal; Notable for the following:       Result Value   Glucose, Bld 201 (*)    Creatinine, Ser 1.22 (*)    GFR calc non Af Amer 43 (*)    GFR calc Af Amer 49 (*)    All other components within normal limits  CBC - Abnormal; Notable for the following:    RBC 2.59 (*)    Hemoglobin 9.1 (*)    HCT 26.2 (*)    MCV 101.2 (*)    MCH 35.2 (*)    RDW 16.4 (*)    Platelets 90 (*)    All other components within normal limits  HEPATIC FUNCTION PANEL - Abnormal; Notable for the following:    Total Protein 8.3 (*)    All other components within normal limits  LIPASE,  BLOOD - Abnormal; Notable for the following:    Lipase 72 (*)    All other components within normal limits  TROPONIN I   ____________________________________________  EKG   ____________________________________________  RADIOLOGY  Radiology reads the KUB as gas cannot rule out obstruction ____________________________________________   PROCEDURES   Procedures    ____________________________________________   INITIAL IMPRESSION / ASSESSMENT AND PLAN / ED COURSE  Pertinent labs & imaging results that were available during my care of the patient were reviewed by me and considered in my medical decision making (see chart for details).    Clinical Course     ____________________________________________   FINAL CLINICAL IMPRESSION(S) / ED DIAGNOSES  Final diagnoses:  Proctitis  Colitis      NEW MEDICATIONS STARTED DURING THIS VISIT:  New Prescriptions   No medications on file     Note:  This document was prepared using Dragon voice recognition software and may include unintentional dictation errors.    Nena Polio, MD 05/07/16 2237

## 2016-05-07 NOTE — H&P (Signed)
[place] [facility] [place] NAME: Teresa Carrillo    MR#:  [id number]  DATE OF BIRTH:  Jun 02, 1941  DATE OF ADMISSION:  05/07/2016  PRIMARY CARE PHYSICIAN: Keith Rake, MD   REQUESTING/REFERRING PHYSICIAN: Cinda Quest, MD  CHIEF COMPLAINT:      Chief Complaint  Patient presents with  . Constipation    HISTORY OF PRESENT ILLNESS:  Teresa Carrillo  is a 75 y.o. female who presents with Complaint of constipation and rectal pain. Patient states that for the past 2 days she's had significant sensation of constipation with sharp rectal pain and she tries to have a bowel movement. Here in the ED she was found to have likely proctitis on CT imaging. Hospitals were called for admission   PAST MEDICAL HISTORY:   Past Medical History:  Diagnosis Date  . Abdominal wall mass   . Anginal pain ([place])   . Anxiety   . Arthritis   . Calculus of kidney   . Cystitis   . Depression   . Diabetes mellitus without complication (HCC)    elevated A1c  . Dyspnea on exertion   . Elevated serum creatinine   . Fibrocystic breast disease   . GERD (gastroesophageal reflux disease)   . Hearing loss   . Heart murmur   . HTN (hypertension)   . Hyperlipidemia   . MDS (myelodysplastic syndrome), high grade ([place]) 04/23/2016  . Microscopic hematuria   . Mucositis due to chemotherapy   . Obesity   . Risk for falls   . Sleep apnea   . Thrombocytopenia ([place])   . Urinary frequency   . Urinary urgency     PAST SURGICAL HISTORY:   Past Surgical History:  Procedure Laterality Date  . ABDOMINAL HYSTERECTOMY    . APPENDECTOMY    . CARDIAC CATHETERIZATION     x2  . CHOLECYSTECTOMY    . COLONOSCOPY N/A 02/24/2015   Procedure: COLONOSCOPY;  Surgeon: Manya Silvas, MD;  Location: [facility] ENDOSCOPY;  Service: Endoscopy;  Laterality: N/A;  . DIAGNOSTIC LAPAROSCOPY     Removal of benign abdominal tumor  . ESOPHAGOGASTRODUODENOSCOPY N/A 02/24/2015   Procedure:  ESOPHAGOGASTRODUODENOSCOPY (EGD);  Surgeon: Manya Silvas, MD;  Location: [facility] ENDOSCOPY;  Service: Endoscopy;  Laterality: N/A;  . right eye surgery Right     SOCIAL HISTORY:   Social History  Substance Use Topics  . Smoking status: Former Smoker    Quit date: 09/09/1988  . Smokeless tobacco: Never Used     Comment: quit 25 years ago  . Alcohol use No    FAMILY HISTORY:   Family History  Problem Relation Age of Onset  . Congestive Heart Failure Mother   . Diabetes Mother   . Coronary artery disease Mother   . Stroke Mother   . Cirrhosis Father     DRUG ALLERGIES:   Allergies  Allergen Reactions  . Macrobid [employer] Macro]     MEDICATIONS AT HOME:   Prior to Admission medications   Medication Sig Start Date End Date Taking? Authorizing Provider  bisoprolol-hydrochlorothiazide (ZIAC) 5-6.25 MG tablet TAKE 1 TABLET BY MOUTH DAILY 02/27/16   Roselee Nova, MD  buPROPion (WELLBUTRIN XL) 150 MG 24 hr tablet Take 300 mg by mouth daily.    Historical Provider, MD  clonazePAM (KLONOPIN) 0.5 MG tablet Take 0.5 mg by mouth 2 (two) times daily.     Historical Provider, MD  lidocaine (XYLOCAINE) 2 % solution  04/30/16  Historical Provider, MD  lisinopril (PRINIVIL,ZESTRIL) 5 MG tablet TAKE 1 TABLET BY MOUTH DAILY 02/20/16   Roselee Nova, MD  LUMIGAN 0.01 % SOLN  04/12/15   Historical Provider, MD  magic mouthwash w/lidocaine SOLN Take 5 mLs by mouth 4 (four) times daily. 80 ml viscous lidocaine 2%, 80 ml Mylanta, 80 ml Diphenhydramine 12.5 mg/5 ml Elixir, 80 ml Nystatin 100,000 Unit suspension, 80 ml Prednisolone 15 mg/25m, 80 ml Distilled Water. Sig: Swish/Swallow 5-10 ml four times a day as needed. Dispense 480 ml. 3RFs 05/01/16   GCammie Sickle MD  metFORMIN (GLUCOPHAGE) 500 MG tablet TAKE 1 TABLET BY MOUTH DAILY 03/21/16   SRoselee Nova MD  Multiple Vitamin (MULTIVITAMIN WITH MINERALS) TABS tablet Take 1 tablet by mouth daily.    Historical Provider,  MD  Omega-3 Fatty Acids (FISH OIL) 1000 MG CAPS Take 1 capsule by mouth daily.    Historical Provider, MD  ondansetron (ZOFRAN) 8 MG tablet Take 1 tablet (8 mg total) by mouth 2 (two) times daily as needed (Nausea or vomiting). 04/23/16   GCammie Sickle MD  prochlorperazine (COMPAZINE) 10 MG tablet Take 1 tablet (10 mg total) by mouth every 6 (six) hours as needed (Nausea or vomiting). 04/23/16   GCammie Sickle MD  rosuvastatin (CRESTOR) 20 MG tablet Take 1 tablet (20 mg total) by mouth daily. 02/21/16   SRoselee Nova MD  sertraline (ZOLOFT) 100 MG tablet Take 100 mg by mouth daily.     Historical Provider, MD  Umeclidinium-Vilanterol (ANORO ELLIPTA) 62.5-25 MCG/INH AEPB Inhale 1 puff into the lungs daily as needed. 05/17/14   Historical Provider, MD  valACYclovir (VALTREX) 1000 MG tablet Take 1 tablet (1,000 mg total) by mouth 2 (two) times daily. 04/16/16   SRoselee Nova MD    REVIEW OF SYSTEMS:  Review of Systems  Constitutional: Negative for chills, fever, malaise/fatigue and weight loss.  HENT: Negative for ear pain, hearing loss and tinnitus.   Eyes: Negative for blurred vision, double vision, pain and redness.  Respiratory: Negative for cough, hemoptysis and shortness of breath.   Cardiovascular: Negative for chest pain, palpitations, orthopnea and leg swelling.  Gastrointestinal: Negative for abdominal pain, constipation, diarrhea, nausea and vomiting.       Rectal pain  Genitourinary: Negative for dysuria, frequency and hematuria.  Musculoskeletal: Negative for back pain, joint pain and neck pain.  Skin:       No acne, rash, or lesions  Neurological: Negative for dizziness, tremors, focal weakness and weakness.  Endo/Heme/Allergies: Negative for polydipsia. Does not bruise/bleed easily.  Psychiatric/Behavioral: Negative for depression. The patient is not nervous/anxious and does not have insomnia.      VITAL SIGNS:   Vitals:   05/07/16 1640 05/07/16 1641  BP:  (!) 120/59   Pulse: 91   Resp: (!) 22   Temp: 98.7 F (37.1 C)   TempSrc: Oral   SpO2: 96%   Weight:  109.3 kg (241 lb)  Height:  _0  (1.651 m)   Wt Readings from Last 3 Encounters:  05/07/16 109.3 kg (241 lb)  05/06/16 109.5 kg (241 lb 7 oz)  04/23/16 108.8 kg (239 lb 15.5 oz)    PHYSICAL EXAMINATION:  Physical Exam  Vitals reviewed. Constitutional: She is oriented to person, place, and time. She appears well-developed and well-nourished. No distress.  HENT:  Head: Normocephalic and atraumatic.  Mouth/Throat: Oropharynx is clear and moist.  Eyes: Conjunctivae and EOM are normal. Pupils  are equal, round, and reactive to light. No scleral icterus.  Neck: Normal range of motion. Neck supple. No JVD present. No thyromegaly present.  Cardiovascular: Normal rate, regular rhythm and intact distal pulses.  Exam reveals no gallop and no friction rub.   No murmur heard. Respiratory: Effort normal and breath sounds normal. No respiratory distress. She has no wheezes. She has no rales.  GI: Soft. Bowel sounds are normal. She exhibits no distension. There is tenderness (low abdomen).  Musculoskeletal: Normal range of motion. She exhibits no edema.  No arthritis, no gout  Lymphadenopathy:    She has no cervical adenopathy.  Neurological: She is alert and oriented to person, place, and time. No cranial nerve deficit.  No dysarthria, no aphasia  Skin: Skin is warm and dry. No rash noted. No erythema.  Psychiatric: She has a normal mood and affect. Her behavior is normal. Judgment and thought content normal.    LABORATORY PANEL:   CBC  Recent Labs Lab 05/07/16 1646  WBC 7.1  HGB 9.1*  HCT 26.2*  PLT 90*   ------------------------------------------------------------------------------------------------------------------  Chemistries   Recent Labs Lab 05/07/16 1646 05/07/16 1652  NA 136  --   K 4.4  --   CL 103  --   CO2 25  --   GLUCOSE 201*  --   BUN 19  --    CREATININE 1.22*  --   CALCIUM 9.6  --   AST  --  26  ALT  --  25  ALKPHOS  --  65  BILITOT  --  0.6   ------------------------------------------------------------------------------------------------------------------  Cardiac Enzymes  Recent Labs Lab 05/07/16 1652  TROPONINI <0.03   ------------------------------------------------------------------------------------------------------------------  RADIOLOGY:  Dg Abdomen 1 View  Result Date: 05/07/2016 CLINICAL DATA:  75 y/o F; 3 days of constipation. History of bone marrow cancer starting chemo last Monday. History of cholecystectomy. EXAM: ABDOMEN - 1 VIEW COMPARISON:  05/08/2015 abdomen radiographs. FINDINGS: The mildly dilated air-filled loop of colon projects over the lower abdomen. No pneumoperitoneum is identified. Degenerative changes of the lower lumbar spine. Cholecystectomy clips. IMPRESSION: Mildly dilated loop of colon projects over lower abdomen and may represent ileus or obstruction. Electronically Signed   By: Kristine Garbe M.D.   On: 05/07/2016 18:13   Ct Abdomen Pelvis W Contrast  Result Date: 05/07/2016 CLINICAL DATA:  Constipation for 3 days. Abdominal pain. Chemotherapy today for myelodysplastic syndrome. Bone marrow biopsy on 04/17/2016 EXAM: CT ABDOMEN AND PELVIS WITH CONTRAST TECHNIQUE: Multidetector CT imaging of the abdomen and pelvis was performed using the standard protocol following bolus administration of intravenous contrast. CONTRAST:  80m ISOVUE-300 IOPAMIDOL (ISOVUE-300) INJECTION 61% COMPARISON:  11/02/2014 hand radiographs from 05/07/2016 FINDINGS: Lower chest: Mild cardiomegaly. Scarring in the posterior basal segment left lower lobe. Hepatobiliary: Punctate calcifications in the liver compatible with old granulomatous disease. Cholecystectomy. Pancreas: Unremarkable Spleen: Old granulomatous disease of the spleen. Adrenals/Urinary Tract: Adrenal glands unremarkable. Fluid density 1.1 cm  lesion of the right mid kidney, image 35/2, not appreciably changed, likely a cyst. Several similar but smaller nonspecific lesions present along the right kidney lower pole in the left kidney. Most of these lesions are technically too small to characterize. 2 mm right kidney upper pole nonobstructive renal calculus, image 69/5. 2 by 3 mm right kidney lower pole nonobstructive calculus, image 56/5. 2 mm right kidney lower pole nonobstructive calculus, image 52/5. 1 mm left kidney lower pole nonobstructive calculus, image 60/5. Stomach/Bowel: Air fluid levels in the distal colon, with considerable  wall thickening in the lower rectum and perirectal stranding this wall thickening extends down to the anorectal junction and the rectum is fluid-filled. Presacral edema noted. Vascular/Lymphatic: Just past the hiatus by the gastroesophageal junction there is a 1.0 cm in short axis lymph node on image 10/2, formerly 0.9 cm on 11/02/2014. Similar upper normal size lymph nodes are present along the gastrohepatic plate. Scattered small nodules adjacent to the spleen may represent small splenules or lymph nodes. Generally small retroperitoneal lymph nodes are observed. Aortoiliac atherosclerotic vascular disease. Reproductive: Uterus absent.  Ovaries normal. Other: Slightly lobulated 1.5 by 2.0 cm subcutaneous nodule along the left anterior upper abdomen, image 17/2, previously 1.8 by 1.6 cm by my measurements and similar morphology. Musculoskeletal: Facet arthropathy at all levels below L3 in the lumbar spine with loss of disc height at L3-4 and 3 mm degenerative retrolisthesis at L3-4. These factors contribute to mild foraminal impingement at the L3-4 and L4-5 levels. There is mild spurring of femoral heads, left greater than right. IMPRESSION: 1. The dominant finding is notable abnormal wall thickening in the lower rectum extending to the anus. Associated fluid-filled rectum and distal air fluid levels in the large bowel  indicating diarrheal process. The cause of this lower rectal and anorectal inflammation is uncertain but there is perirectal stranding and presacral stranding. No perirectal abscess or definite regional fistula is identified, the ischiorectal fossa appears normal bilaterally. 2. Bilateral nonobstructive nephrolithiasis. 3. Stable subcutaneous nodule along the left anterior upper abdomen, nonspecific but not appreciably changed from 11/02/2014. There is also stable borderline nodal enlargement near the gastroesophageal junction. 4. Other imaging findings of potential clinical significance: Mild cardiomegaly. Scarring in the left lower lobe. Old granulomatous disease of the liver and spleen. Scattered low-density lesions in both kidneys, some of which are cysts but some of which are technically too small to characterize. Aortoiliac atherosclerotic vascular disease. Facet arthropathy and disc disease in the lumbar spine contributing to mild foraminal impingement at L3-4 and L4-5. Mild degenerative spurring of both femoral heads. Electronically Signed   By: Van Clines M.D.   On: 05/07/2016 20:55    EKG:   Orders placed or performed during the hospital encounter of 02/19/16  . ED EKG  . ED EKG    IMPRESSION AND PLAN:  Principal Problem:   Proctitis - no history of IBD, infection likely cause.  IV antibiotics given in ED and continued on admission.  GI consult. Active Problems:   Arteriosclerosis of coronary artery - continue home meds   Diabetes mellitus, type 2 (HCC) - SSI with glucose checks and carb modified diet   HTN (hypertension) - stable, continue home meds   MDS (myelodysplastic syndrome), high grade ([place]) - currently on chemotherapy, no acute need for oncology consult   COPD, mild ([place]) - continue home inhalers  All the records are reviewed and case discussed with ED provider. Management plans discussed with the patient and/or family.  DVT PROPHYLAXIS: SubQ lovenox  GI  PROPHYLAXIS: None  ADMISSION STATUS: Inpatient  CODE STATUS: Full    Code Status History    This patient does not have a recorded code status. Please follow your organizational policy for patients in this situation.      TOTAL TIME TAKING CARE OF THIS PATIENT: 45 minutes.    Teresa Carrillo 05/07/2016, 9:54 PM  [facility]  Office  [phone]  CC: Primary care physician; Keith Rake, MD

## 2016-05-08 LAB — CBC
HEMATOCRIT: 22.2 % — AB (ref 35.0–47.0)
Hemoglobin: 7.7 g/dL — ABNORMAL LOW (ref 12.0–16.0)
MCH: 35 pg — ABNORMAL HIGH (ref 26.0–34.0)
MCHC: 34.7 g/dL (ref 32.0–36.0)
MCV: 100.6 fL — ABNORMAL HIGH (ref 80.0–100.0)
Platelets: 65 10*3/uL — ABNORMAL LOW (ref 150–440)
RBC: 2.21 MIL/uL — ABNORMAL LOW (ref 3.80–5.20)
RDW: 17 % — AB (ref 11.5–14.5)
WBC: 4.5 10*3/uL (ref 3.6–11.0)

## 2016-05-08 LAB — GLUCOSE, CAPILLARY
GLUCOSE-CAPILLARY: 136 mg/dL — AB (ref 65–99)
Glucose-Capillary: 184 mg/dL — ABNORMAL HIGH (ref 65–99)
Glucose-Capillary: 216 mg/dL — ABNORMAL HIGH (ref 65–99)

## 2016-05-08 LAB — BASIC METABOLIC PANEL
Anion gap: 5 (ref 5–15)
BUN: 18 mg/dL (ref 6–20)
CALCIUM: 9 mg/dL (ref 8.9–10.3)
CO2: 32 mmol/L (ref 22–32)
Chloride: 102 mmol/L (ref 101–111)
Creatinine, Ser: 1.24 mg/dL — ABNORMAL HIGH (ref 0.44–1.00)
GFR calc Af Amer: 48 mL/min — ABNORMAL LOW (ref >60)
GFR, EST NON AFRICAN AMERICAN: 42 mL/min — AB (ref >60)
GLUCOSE: 146 mg/dL — AB (ref 65–99)
POTASSIUM: 4.2 mmol/L (ref 3.5–5.1)
Sodium: 139 mmol/L (ref 135–145)

## 2016-05-08 LAB — HEMOGLOBIN A1C: Hgb A1c MFr Bld: 5.6 % (ref 4.0–6.0)

## 2016-05-08 MED ORDER — HYDROCORTISONE ACETATE 25 MG RE SUPP
25.0000 mg | Freq: Two times a day (BID) | RECTAL | Status: DC
  Administered 2016-05-08 – 2016-05-10 (×5): 25 mg via RECTAL
  Filled 2016-05-08 (×6): qty 1

## 2016-05-08 MED ORDER — ENOXAPARIN SODIUM 40 MG/0.4ML ~~LOC~~ SOLN: 40.0000 mg | SUBCUTANEOUS | Status: DC

## 2016-05-08 MED ORDER — SODIUM CHLORIDE 0.9 % IV SOLN
INTRAVENOUS | Status: DC
  Administered 2016-05-08 – 2016-05-10 (×4): via INTRAVENOUS

## 2016-05-08 MED ORDER — ARFORMOTEROL TARTRATE 15 MCG/2ML IN NEBU
15.0000 ug | INHALATION_SOLUTION | Freq: Two times a day (BID) | RESPIRATORY_TRACT | Status: DC
  Administered 2016-05-09 – 2016-05-10 (×3): 15 ug via RESPIRATORY_TRACT
  Filled 2016-05-08 (×3): qty 2

## 2016-05-08 MED ORDER — LATANOPROST 0.005 % OP SOLN
1.0000 [drp] | Freq: Every day | OPHTHALMIC | Status: DC
  Administered 2016-05-08 – 2016-05-09 (×3): 1 [drp] via OPHTHALMIC
  Filled 2016-05-08: qty 2.5

## 2016-05-08 NOTE — Care Management Important Message (Signed)
Important Message  Patient Details  Name: Teresa Carrillo MRN: [id number] Date of Birth: December 04, 1940   Medicare Important Message Given:  Yes    Shelbie Ammons, RN 05/08/2016, 11:26 AM

## 2016-05-08 NOTE — Progress Notes (Signed)
[place] [facility] [place] NAME: Teresa Carrillo    MR#:  [id number]  DATE OF BIRTH:  Aug 08, 1941  SUBJECTIVE:   She is here due to lower abdominal pain and constipation and suspected to have proctitis. Still complaining of some pressure in her rectal area. Had a bowel movement earlier this morning. No nausea or vomiting.  REVIEW OF SYSTEMS:    Review of Systems  Constitutional: Negative for chills and fever.  HENT: Negative for congestion and tinnitus.   Eyes: Negative for blurred vision and double vision.  Respiratory: Negative for cough, shortness of breath and wheezing.   Cardiovascular: Negative for chest pain, orthopnea and PND.  Gastrointestinal: Positive for abdominal pain and constipation. Negative for diarrhea, nausea and vomiting.  Genitourinary: Negative for dysuria and hematuria.  Neurological: Negative for dizziness, sensory change and focal weakness.  All other systems reviewed and are negative.   Nutrition: Clear liquids Tolerating Diet: yes Tolerating PT: Await Eval.    DRUG ALLERGIES:   Allergies  Allergen Reactions  . Macrobid [employer] Macro] Other (See Comments)    Reaction: unknown    VITALS:  Blood pressure (!) 154/63, pulse 87, temperature 99.5 F (37.5 C), temperature source Oral, resp. rate 16, height _0  (1.651 m), weight 109.3 kg (241 lb), SpO2 97 %.  PHYSICAL EXAMINATION:   Physical Exam  GENERAL:  75 y.o.-year-old patient lying in the bed with no acute distress.  EYES: Pupils equal, round, reactive to light and accommodation. No scleral icterus. Extraocular muscles intact.  HEENT: Head atraumatic, normocephalic. Oropharynx and nasopharynx clear.  NECK:  Supple, no jugular venous distention. No thyroid enlargement, no tenderness.  LUNGS: Normal breath sounds bilaterally, no wheezing, rales, rhonchi. No use of accessory muscles of respiration.  CARDIOVASCULAR: S1, S2 normal. No murmurs,  rubs, or gallops.  ABDOMEN: Soft, nontender, Slightly distended. Bowel sounds present. No organomegaly or mass.  EXTREMITIES: No cyanosis, clubbing or edema b/l.    NEUROLOGIC: Cranial nerves II through XII are intact. No focal Motor or sensory deficits b/l.   PSYCHIATRIC: The patient is alert and oriented x 3.  SKIN: No obvious rash, lesion, or ulcer.    LABORATORY PANEL:   CBC  Recent Labs Lab 05/08/16 0511  WBC 4.5  HGB 7.7*  HCT 22.2*  PLT 65*   ------------------------------------------------------------------------------------------------------------------  Chemistries   Recent Labs Lab 05/07/16 1652 05/08/16 0511  NA  --  139  K  --  4.2  CL  --  102  CO2  --  32  GLUCOSE  --  146*  BUN  --  18  CREATININE  --  1.24*  CALCIUM  --  9.0  AST 26  --   ALT 25  --   ALKPHOS 65  --   BILITOT 0.6  --    ------------------------------------------------------------------------------------------------------------------  Cardiac Enzymes  Recent Labs Lab 05/07/16 1652  TROPONINI <0.03   ------------------------------------------------------------------------------------------------------------------  RADIOLOGY:  Dg Abdomen 1 View  Result Date: 05/07/2016 CLINICAL DATA:  75 y/o F; 3 days of constipation. History of bone marrow cancer starting chemo last Monday. History of cholecystectomy. EXAM: ABDOMEN - 1 VIEW COMPARISON:  05/08/2015 abdomen radiographs. FINDINGS: The mildly dilated air-filled loop of colon projects over the lower abdomen. No pneumoperitoneum is identified. Degenerative changes of the lower lumbar spine. Cholecystectomy clips. IMPRESSION: Mildly dilated loop of colon projects over lower abdomen and may represent ileus or obstruction. Electronically Signed   By: Edgardo Roys.D.  On: 05/07/2016 18:13   Ct Abdomen Pelvis W Contrast  Result Date: 05/07/2016 CLINICAL DATA:  Constipation for 3 days. Abdominal pain. Chemotherapy today for  myelodysplastic syndrome. Bone marrow biopsy on 04/17/2016 EXAM: CT ABDOMEN AND PELVIS WITH CONTRAST TECHNIQUE: Multidetector CT imaging of the abdomen and pelvis was performed using the standard protocol following bolus administration of intravenous contrast. CONTRAST:  66m ISOVUE-300 IOPAMIDOL (ISOVUE-300) INJECTION 61% COMPARISON:  11/02/2014 hand radiographs from 05/07/2016 FINDINGS: Lower chest: Mild cardiomegaly. Scarring in the posterior basal segment left lower lobe. Hepatobiliary: Punctate calcifications in the liver compatible with old granulomatous disease. Cholecystectomy. Pancreas: Unremarkable Spleen: Old granulomatous disease of the spleen. Adrenals/Urinary Tract: Adrenal glands unremarkable. Fluid density 1.1 cm lesion of the right mid kidney, image 35/2, not appreciably changed, likely a cyst. Several similar but smaller nonspecific lesions present along the right kidney lower pole in the left kidney. Most of these lesions are technically too small to characterize. 2 mm right kidney upper pole nonobstructive renal calculus, image 69/5. 2 by 3 mm right kidney lower pole nonobstructive calculus, image 56/5. 2 mm right kidney lower pole nonobstructive calculus, image 52/5. 1 mm left kidney lower pole nonobstructive calculus, image 60/5. Stomach/Bowel: Air fluid levels in the distal colon, with considerable wall thickening in the lower rectum and perirectal stranding this wall thickening extends down to the anorectal junction and the rectum is fluid-filled. Presacral edema noted. Vascular/Lymphatic: Just past the hiatus by the gastroesophageal junction there is a 1.0 cm in short axis lymph node on image 10/2, formerly 0.9 cm on 11/02/2014. Similar upper normal size lymph nodes are present along the gastrohepatic plate. Scattered small nodules adjacent to the spleen may represent small splenules or lymph nodes. Generally small retroperitoneal lymph nodes are observed. Aortoiliac atherosclerotic vascular  disease. Reproductive: Uterus absent.  Ovaries normal. Other: Slightly lobulated 1.5 by 2.0 cm subcutaneous nodule along the left anterior upper abdomen, image 17/2, previously 1.8 by 1.6 cm by my measurements and similar morphology. Musculoskeletal: Facet arthropathy at all levels below L3 in the lumbar spine with loss of disc height at L3-4 and 3 mm degenerative retrolisthesis at L3-4. These factors contribute to mild foraminal impingement at the L3-4 and L4-5 levels. There is mild spurring of femoral heads, left greater than right. IMPRESSION: 1. The dominant finding is notable abnormal wall thickening in the lower rectum extending to the anus. Associated fluid-filled rectum and distal air fluid levels in the large bowel indicating diarrheal process. The cause of this lower rectal and anorectal inflammation is uncertain but there is perirectal stranding and presacral stranding. No perirectal abscess or definite regional fistula is identified, the ischiorectal fossa appears normal bilaterally. 2. Bilateral nonobstructive nephrolithiasis. 3. Stable subcutaneous nodule along the left anterior upper abdomen, nonspecific but not appreciably changed from 11/02/2014. There is also stable borderline nodal enlargement near the gastroesophageal junction. 4. Other imaging findings of potential clinical significance: Mild cardiomegaly. Scarring in the left lower lobe. Old granulomatous disease of the liver and spleen. Scattered low-density lesions in both kidneys, some of which are cysts but some of which are technically too small to characterize. Aortoiliac atherosclerotic vascular disease. Facet arthropathy and disc disease in the lumbar spine contributing to mild foraminal impingement at L3-4 and L4-5. Mild degenerative spurring of both femoral heads. Electronically Signed   By: WVan ClinesM.D.   On: 05/07/2016 20:55     ASSESSMENT AND PLAN:   75year old female with past medical history myelodysplasia,  chronic anemia, thrombocytopenia, diabetes, hypertension, hyperlipidemia,  anxiety who presented to the hospital due to constipation and lower abdominal pain.  1. Lower abdominal pain/constipation-patient's CT scan was suggestive of proctitis. -Patient did have a bowel movement earlier today. She complains of rectal fullness. She's had a previous endoscopy done in the past which was suggestive of hemorrhoids. -I think most of her symptoms related to her constipation with some rectal inflammation. I will DC IV ciprofloxacin, Flagyl. I will start her on some Proctofoam suppositories to see if this helps. -Place were clear liquid diet. -If not improving consider gastroenterology consult.  2. History myelodysplasia-patient is currently undergoing chemotherapy -Continue follow-up with the cancer Center.  3. Chronic anemia/Thrombocytopenia - due to chemo, MDS. Follow counts.  No need for transfusion presently.   4. DM type II w/out complication - Hold Metformin.  - cont. SSI for now.   5. Hyperlipidemia - cont. Crestor  6. Depression - cont. Zoloft, Wellbutrin  7. Anxiety - cont. Klonopin.    All the records are reviewed and case discussed with Care Management/Social Worker. Management plans discussed with the patient, family and they are in agreement.  CODE STATUS: Full   DVT Prophylaxis: Ted's SCD's  TOTAL TIME TAKING CARE OF THIS PATIENT: 30 minutes.   POSSIBLE D/C IN 1-2 DAYS, DEPENDING ON CLINICAL CONDITION.   Henreitta Leber M.D on 05/08/2016 at 12:53 PM  Between 7am to 6pm - Pager - [phone]  After 6pm go to [email or web address] - Proofreader  [employer]  [facility]  Office  [phone]  CC: Primary care physician; Keith Rake, MD

## 2016-05-08 NOTE — Plan of Care (Signed)
Problem: Bowel/Gastric: Goal: Will not experience complications related to bowel motility Outcome: Progressing Smear of stool during the shift per pt. Hemorrhoids noted upon assessment. Tolerates clear liquid diet. No c/o n/v. Pt c/o rectal pain once.

## 2016-05-08 NOTE — Care Management (Signed)
Admitted to [facility] with the diagnosis of proctitis. Lives with husband, Jenny Reichmann, [phone]). Last seen Dr. Manuella Ghazi about 6 weeks. CPAP in the home or many years. No home Health in the past, No skilled facility. No home oxygen. Uses no aids for ambulation. Prescriptions are filled at [facility] in [place], [place] care of all basic activities of daily living herself, doesn't drive. No falls. Decreased appetite.  Vidaza injections at the [facility] x 7 days.  Husband will transport. Shelbie Ammons RN MSN CCM Care Management [phone]

## 2016-05-09 ENCOUNTER — Inpatient Hospital Stay: Payer: Medicare Other

## 2016-05-09 LAB — BASIC METABOLIC PANEL
Anion gap: 2 — ABNORMAL LOW (ref 5–15)
BUN: 15 mg/dL (ref 6–20)
CALCIUM: 8.6 mg/dL — AB (ref 8.9–10.3)
CO2: 32 mmol/L (ref 22–32)
CREATININE: 1 mg/dL (ref 0.44–1.00)
Chloride: 106 mmol/L (ref 101–111)
GFR calc non Af Amer: 54 mL/min — ABNORMAL LOW (ref >60)
Glucose, Bld: 117 mg/dL — ABNORMAL HIGH (ref 65–99)
Potassium: 3.7 mmol/L (ref 3.5–5.1)
SODIUM: 140 mmol/L (ref 135–145)

## 2016-05-09 LAB — CBC
HCT: 22.5 % — ABNORMAL LOW (ref 35.0–47.0)
Hemoglobin: 7.8 g/dL — ABNORMAL LOW (ref 12.0–16.0)
MCH: 34.6 pg — AB (ref 26.0–34.0)
MCHC: 34.5 g/dL (ref 32.0–36.0)
MCV: 100.5 fL — ABNORMAL HIGH (ref 80.0–100.0)
Platelets: 80 10*3/uL — ABNORMAL LOW (ref 150–440)
RBC: 2.24 MIL/uL — ABNORMAL LOW (ref 3.80–5.20)
RDW: 16.9 % — AB (ref 11.5–14.5)
WBC: 2.3 10*3/uL — ABNORMAL LOW (ref 3.6–11.0)

## 2016-05-09 LAB — GLUCOSE, CAPILLARY
GLUCOSE-CAPILLARY: 112 mg/dL — AB (ref 65–99)
GLUCOSE-CAPILLARY: 122 mg/dL — AB (ref 65–99)
Glucose-Capillary: 124 mg/dL — ABNORMAL HIGH (ref 65–99)
Glucose-Capillary: 124 mg/dL — ABNORMAL HIGH (ref 65–99)
Glucose-Capillary: 149 mg/dL — ABNORMAL HIGH (ref 65–99)
Glucose-Capillary: 157 mg/dL — ABNORMAL HIGH (ref 65–99)

## 2016-05-09 NOTE — Progress Notes (Signed)
[place] at [place] NAME: Teresa Carrillo    MR#:  [id number]  DATE OF BIRTH:  Jul 24, 1941  SUBJECTIVE:   She is here due to lower abdominal pain and constipation and suspected to have proctitis. Still complaining of some rectal pain, fullness.  No N/V and tolerating clear liquids well.   REVIEW OF SYSTEMS:    Review of Systems  Constitutional: Negative for chills and fever.  HENT: Negative for congestion and tinnitus.   Eyes: Negative for blurred vision and double vision.  Respiratory: Negative for cough, shortness of breath and wheezing.   Cardiovascular: Negative for chest pain, orthopnea and PND.  Gastrointestinal: Positive for abdominal pain and constipation. Negative for diarrhea, nausea and vomiting.  Genitourinary: Negative for dysuria and hematuria.  Neurological: Negative for dizziness, sensory change and focal weakness.  All other systems reviewed and are negative.   Nutrition: Clear liquids Tolerating Diet: yes Tolerating PT: Await Eval.    DRUG ALLERGIES:   Allergies  Allergen Reactions  . Macrobid [employer] Macro] Other (See Comments)    Reaction: unknown    VITALS:  Blood pressure (!) 113/51, pulse 74, temperature 98 F (36.7 C), temperature source Oral, resp. rate 18, height '5\' 5"'$  (1.651 m), weight 109.3 kg (241 lb), SpO2 95 %.  PHYSICAL EXAMINATION:   Physical Exam  GENERAL:  75 y.o.-year-old patient lying in the bed with no acute distress.  EYES: Pupils equal, round, reactive to light and accommodation. No scleral icterus. Extraocular muscles intact.  HEENT: Head atraumatic, normocephalic. Oropharynx and nasopharynx clear.  NECK:  Supple, no jugular venous distention. No thyroid enlargement, no tenderness.  LUNGS: Normal breath sounds bilaterally, no wheezing, rales, rhonchi. No use of accessory muscles of respiration.  CARDIOVASCULAR: S1, S2 normal. No murmurs, rubs, or gallops.  ABDOMEN: Soft,  nontender, Slightly distended. Bowel sounds Hypoactive. No organomegaly or mass.  EXTREMITIES: No cyanosis, clubbing or edema b/l.    NEUROLOGIC: Cranial nerves II through XII are intact. No focal Motor or sensory deficits b/l. Globally weak. PSYCHIATRIC: The patient is alert and oriented x 3.  SKIN: No obvious rash, lesion, or ulcer.    LABORATORY PANEL:   CBC  Recent Labs Lab 05/09/16 0531  WBC 2.3*  HGB 7.8*  HCT 22.5*  PLT 80*   ------------------------------------------------------------------------------------------------------------------  Chemistries   Recent Labs Lab 05/07/16 1652  05/09/16 0531  NA  --   < > 140  K  --   < > 3.7  CL  --   < > 106  CO2  --   < > 32  GLUCOSE  --   < > 117*  BUN  --   < > 15  CREATININE  --   < > 1.00  CALCIUM  --   < > 8.6*  AST 26  --   --   ALT 25  --   --   ALKPHOS 65  --   --   BILITOT 0.6  --   --   < > = values in this interval not displayed. ------------------------------------------------------------------------------------------------------------------  Cardiac Enzymes  Recent Labs Lab 05/07/16 1652  TROPONINI <0.03   ------------------------------------------------------------------------------------------------------------------  RADIOLOGY:  Dg Abdomen 1 View  Result Date: 05/07/2016 CLINICAL DATA:  75 y/o F; 3 days of constipation. History of bone marrow cancer starting chemo last Monday. History of cholecystectomy. EXAM: ABDOMEN - 1 VIEW COMPARISON:  05/08/2015 abdomen radiographs. FINDINGS: The mildly dilated air-filled loop of colon projects over the  lower abdomen. No pneumoperitoneum is identified. Degenerative changes of the lower lumbar spine. Cholecystectomy clips. IMPRESSION: Mildly dilated loop of colon projects over lower abdomen and may represent ileus or obstruction. Electronically Signed   By: Kristine Garbe M.D.   On: 05/07/2016 18:13   Ct Abdomen Pelvis W Contrast  Result Date:  05/07/2016 CLINICAL DATA:  Constipation for 3 days. Abdominal pain. Chemotherapy today for myelodysplastic syndrome. Bone marrow biopsy on 04/17/2016 EXAM: CT ABDOMEN AND PELVIS WITH CONTRAST TECHNIQUE: Multidetector CT imaging of the abdomen and pelvis was performed using the standard protocol following bolus administration of intravenous contrast. CONTRAST:  50m ISOVUE-300 IOPAMIDOL (ISOVUE-300) INJECTION 61% COMPARISON:  11/02/2014 hand radiographs from 05/07/2016 FINDINGS: Lower chest: Mild cardiomegaly. Scarring in the posterior basal segment left lower lobe. Hepatobiliary: Punctate calcifications in the liver compatible with old granulomatous disease. Cholecystectomy. Pancreas: Unremarkable Spleen: Old granulomatous disease of the spleen. Adrenals/Urinary Tract: Adrenal glands unremarkable. Fluid density 1.1 cm lesion of the right mid kidney, image 35/2, not appreciably changed, likely a cyst. Several similar but smaller nonspecific lesions present along the right kidney lower pole in the left kidney. Most of these lesions are technically too small to characterize. 2 mm right kidney upper pole nonobstructive renal calculus, image 69/5. 2 by 3 mm right kidney lower pole nonobstructive calculus, image 56/5. 2 mm right kidney lower pole nonobstructive calculus, image 52/5. 1 mm left kidney lower pole nonobstructive calculus, image 60/5. Stomach/Bowel: Air fluid levels in the distal colon, with considerable wall thickening in the lower rectum and perirectal stranding this wall thickening extends down to the anorectal junction and the rectum is fluid-filled. Presacral edema noted. Vascular/Lymphatic: Just past the hiatus by the gastroesophageal junction there is a 1.0 cm in short axis lymph node on image 10/2, formerly 0.9 cm on 11/02/2014. Similar upper normal size lymph nodes are present along the gastrohepatic plate. Scattered small nodules adjacent to the spleen may represent small splenules or lymph nodes.  Generally small retroperitoneal lymph nodes are observed. Aortoiliac atherosclerotic vascular disease. Reproductive: Uterus absent.  Ovaries normal. Other: Slightly lobulated 1.5 by 2.0 cm subcutaneous nodule along the left anterior upper abdomen, image 17/2, previously 1.8 by 1.6 cm by my measurements and similar morphology. Musculoskeletal: Facet arthropathy at all levels below L3 in the lumbar spine with loss of disc height at L3-4 and 3 mm degenerative retrolisthesis at L3-4. These factors contribute to mild foraminal impingement at the L3-4 and L4-5 levels. There is mild spurring of femoral heads, left greater than right. IMPRESSION: 1. The dominant finding is notable abnormal wall thickening in the lower rectum extending to the anus. Associated fluid-filled rectum and distal air fluid levels in the large bowel indicating diarrheal process. The cause of this lower rectal and anorectal inflammation is uncertain but there is perirectal stranding and presacral stranding. No perirectal abscess or definite regional fistula is identified, the ischiorectal fossa appears normal bilaterally. 2. Bilateral nonobstructive nephrolithiasis. 3. Stable subcutaneous nodule along the left anterior upper abdomen, nonspecific but not appreciably changed from 11/02/2014. There is also stable borderline nodal enlargement near the gastroesophageal junction. 4. Other imaging findings of potential clinical significance: Mild cardiomegaly. Scarring in the left lower lobe. Old granulomatous disease of the liver and spleen. Scattered low-density lesions in both kidneys, some of which are cysts but some of which are technically too small to characterize. Aortoiliac atherosclerotic vascular disease. Facet arthropathy and disc disease in the lumbar spine contributing to mild foraminal impingement at L3-4 and L4-5. Mild degenerative  spurring of both femoral heads. Electronically Signed   By: Van Clines M.D.   On: 05/07/2016 20:55      ASSESSMENT AND PLAN:   75 year old female with past medical history myelodysplasia, chronic anemia, thrombocytopenia, diabetes, hypertension, hyperlipidemia, anxiety who presented to the hospital due to constipation and lower abdominal pain.  1. Lower abdominal pain/constipation-patient's CT scan on admission was suggestive of proctitis. -Still complaining of rectal fullness and pain. Not improved with Proctofoam suppositories. -I will get a KUB today to rule out ileus/obstruction. If needed consider giving her an enema, or even consider gastroenterology consult. -Off antibiotics now.  2. History of myelodysplasia-patient is currently undergoing chemotherapy.   -Continue follow-up with the cancer Center.  3. Pancytopenia - due to chemo, MDS. Follow counts.  No need for transfusion presently.   4. DM type II w/out complication - Hold Metformin.  - cont. SSI for now.   5. Hyperlipidemia - cont. Crestor  6. Depression - cont. Zoloft, Wellbutrin  7. Anxiety - cont. Klonopin.   Await KUB results today.  PT eval.   All the records are reviewed and case discussed with Care Management/Social Worker. Management plans discussed with the patient, family and they are in agreement.  CODE STATUS: Full   DVT Prophylaxis: Ted's SCD's  TOTAL TIME TAKING CARE OF THIS PATIENT: 30 minutes.   POSSIBLE D/C IN 1-2 DAYS, DEPENDING ON CLINICAL CONDITION.   Henreitta Leber M.D on 05/09/2016 at 1:23 PM  Between 7am to 6pm - Pager - [phone]  After 6pm go to [email or web address] - Proofreader  [facility]  Office  [phone]  CC: Primary care physician; Keith Rake, MD

## 2016-05-09 NOTE — Progress Notes (Signed)
Pt refused to wear Cpap. Nurse notified.

## 2016-05-10 LAB — GLUCOSE, CAPILLARY: GLUCOSE-CAPILLARY: 99 mg/dL (ref 65–99)

## 2016-05-10 LAB — CBC
HEMATOCRIT: 21.4 % — AB (ref 35.0–47.0)
Hemoglobin: 7.3 g/dL — ABNORMAL LOW (ref 12.0–16.0)
MCH: 34.4 pg — ABNORMAL HIGH (ref 26.0–34.0)
MCHC: 34.3 g/dL (ref 32.0–36.0)
MCV: 100.5 fL — AB (ref 80.0–100.0)
PLATELETS: 71 10*3/uL — AB (ref 150–440)
RBC: 2.13 MIL/uL — ABNORMAL LOW (ref 3.80–5.20)
RDW: 16.8 % — AB (ref 11.5–14.5)
WBC: 1.7 10*3/uL — AB (ref 3.6–11.0)

## 2016-05-10 MED ORDER — HYDROCORTISONE 2.5 % RE CREA: 1.0000 "application " | TOPICAL_CREAM | Freq: Two times a day (BID) | RECTAL | 0 refills | Status: DC | PRN

## 2016-05-10 MED ORDER — POLYETHYLENE GLYCOL 3350 17 G PO PACK: 17.0000 g | PACK | Freq: Every day | ORAL | 1 refills | Status: DC | PRN

## 2016-05-10 MED ORDER — HYDROCORTISONE ACETATE 25 MG RE SUPP: 25.0000 mg | Freq: Two times a day (BID) | RECTAL | 0 refills | Status: DC

## 2016-05-10 NOTE — Progress Notes (Signed)
Nutrition Education Note:  Pt with questions regarding neutropenic precautions and foods to eat to relieve constipation.    Discussed food preparation, heating, cooling and reheating foods with pt and sister at bedside.  Also discussed drinking plenty of fluid, adding fiber to diet and being active as much as MD allows to decrease risk of constipation.  Pt and sister verbalized understanding.  Discharging today.  Chart and labs reviewed.  Please reconsult if needed.  Shanard Treto B. Zenia Resides, [place], [place] (pager) Weekend/On-Call pager [phone])

## 2016-05-10 NOTE — Progress Notes (Signed)
Pt being discharged home, discharge instructions and prescriptions reviewed with pt and sister, states understanding, pt with no noted complaints, no distress or discomfort noted

## 2016-05-10 NOTE — Evaluation (Signed)
Physical Therapy Evaluation Patient Details Name: Teresa Carrillo MRN: [id number] DOB: 1940-11-02 Today's Date: 05/10/2016   History of Present Illness  Pt is a 75 y.o. female presenting to hospital with constipation, abdominal and rectal pain.  Pt admitted to hospital with proctitis and pancytopenia.  PMH includes myelodysplasia, anginal pain, DM, hearing loss, heart murmur, htn, urinary frequency/urgency, mild COPD.  Clinical Impression  Pt demonstrates steady independent functional mobility without AD.  No loss of balance noted during session with functional mobility or balance activities.  Pt appears safe to discharge home with support of family.  No further PT needs identified in hospital or upon discharge.  Will discharge pt from acute hospital PT (pt to discharge home today).    Follow Up Recommendations No PT follow up    Equipment Recommendations  None recommended by PT    Recommendations for Other Services       Precautions / Restrictions Precautions Precautions: Fall Precaution Comments: Protective precautions Restrictions Weight Bearing Restrictions: No      Mobility  Bed Mobility Overal bed mobility: Independent                Transfers Overall transfer level: Independent Equipment used: None             General transfer comment: steady without loss of balance  Ambulation/Gait Ambulation/Gait assistance: Independent Ambulation Distance (Feet): 150 Feet Assistive device: None Gait Pattern/deviations: Step-through pattern   Gait velocity interpretation: >2.62 ft/sec, indicative of independent community ambulator General Gait Details: steady without loss of balance  Stairs Stairs: Yes Stairs assistance: Supervision Stair Management: One rail Right;Step to pattern;Forwards Number of Stairs: 3 General stair comments: steady without loss of balance  Wheelchair Mobility    Modified Rankin (Stroke Patients Only)       Balance Overall  balance assessment: Independent (Pt able to ambulate with head turns R/L/up/down and increasing/decreasing speed without loss of balance.)                               Standardized Balance Assessment Standardized Balance Assessment :  (Static balance with WBOS and NBOS (both with eyes open and closed) x10 seconds each steady without loss of balance)           Pertinent Vitals/Pain Pain Assessment: No/denies pain  Vitals stable and WFL throughout treatment session.    Home Living Family/patient expects to be discharged to:: Private residence Living Arrangements: Spouse/significant other Available Help at Discharge: Family Type of Home: House Home Access: Stairs to enter Entrance Stairs-Rails: Psychiatric nurse of Steps: 3 Home Layout: One level [place]: [place] - single point      Prior Function Level of Independence: Needs assistance   Gait / Transfers Assistance Needed: Independent (occasional use of SPC)  ADL's / Homemaking Assistance Needed: Independent with ADL's  Comments: (-) driving.     Hand Dominance        Extremity/Trunk Assessment   Upper Extremity Assessment: Overall WFL for tasks assessed           Lower Extremity Assessment: Overall WFL for tasks assessed         Communication   Communication: HOH  Cognition Arousal/Alertness: Awake/alert Behavior During Therapy: WFL for tasks assessed/performed Overall Cognitive Status: Within Functional Limits for tasks assessed                      General Comments General comments (skin integrity,  edema, etc.): Pt in bathroom upon PT arrival; pt's sister present in room.  Face mask worn on pt outside room d/t protective precautions and pt's hands washed upon return to room with soap and water.  Nursing cleared pt for participation in physical therapy.  Pt agreeable to PT session.  Pt educated on pacing and energy conservation strategies as well as safety with  mobility (pt verbalizing good understanding).    Exercises        Assessment/Plan    PT Assessment Patent does not need any further PT services  PT Diagnosis     PT Problem List    PT Treatment Interventions     PT Goals (Current goals can be found in the Care Plan section) Acute Rehab PT Goals Patient Stated Goal: to go home PT Goal Formulation: With patient Time For Goal Achievement: 05/24/16 Potential to Achieve Goals: Good    Frequency     Barriers to discharge        Co-evaluation               End of Session Equipment Utilized During Treatment: Gait belt Activity Tolerance: Patient tolerated treatment well Patient left: in bed;with call bell/phone within reach;with family/visitor present Nurse Communication: Mobility status;Precautions         Time: [id number] PT Time Calculation (min) (ACUTE ONLY): 18 min   Charges:   PT Evaluation $PT Eval Low Complexity: 1 Procedure     PT G CodesLeitha Bleak May 22, 2016, 12:34 PM Leitha Bleak, [place]

## 2016-05-11 NOTE — Discharge Summary (Signed)
[facility] NAME: Teresa Carrillo    MR#:  [id number]  DATE OF BIRTH:  Jun 26, 1941  DATE OF ADMISSION:  05/07/2016 ADMITTING PHYSICIAN: Lance Coon, MD  DATE OF DISCHARGE: 05/10/2016 11:29 AM  PRIMARY CARE PHYSICIAN: Keith Rake, MD    ADMISSION DIAGNOSIS:  Colitis [K52.9] Proctitis [K62.89]  DISCHARGE DIAGNOSIS:  Principal Problem:   Proctitis Active Problems:   Arteriosclerosis of coronary artery   COPD, mild (HCC)   Diabetes mellitus, type 2 (HCC)   HTN (hypertension)   MDS (myelodysplastic syndrome), high grade ([place])   SECONDARY DIAGNOSIS:   Past Medical History:  Diagnosis Date  . Abdominal wall mass   . Anginal pain ([place])   . Anxiety   . Arthritis   . Calculus of kidney   . Cystitis   . Depression   . Diabetes mellitus without complication (HCC)    elevated A1c  . Dyspnea on exertion   . Elevated serum creatinine   . Fibrocystic breast disease   . GERD (gastroesophageal reflux disease)   . Hearing loss   . Heart murmur   . HTN (hypertension)   . Hyperlipidemia   . MDS (myelodysplastic syndrome), high grade ([place]) 04/23/2016  . Microscopic hematuria   . Mucositis due to chemotherapy   . Obesity   . Risk for falls   . Sleep apnea   . Thrombocytopenia ([place])   . Urinary frequency   . Urinary urgency     HOSPITAL COURSE:   75 year old female with past medical history myelodysplasia, chronic anemia, thrombocytopenia, diabetes, hypertension, hyperlipidemia, anxiety who presented to the hospital due to constipation and lower abdominal pain.  1. Lower abdominal pain/constipation-patient's CT scan on admission was suggestive of proctitis.  Pt. Was treated with Hydrocortisone Positive for malaise and has significantly improved. -She still has some rectal fullness but it's improved since admission. There is no evidence of bowel obstruction on the x-ray prior to discharge.  -Patient will continue some hydrocortisone  cream and suppositories for her hemorrhoids and rectal fullness.  2. History of myelodysplasia-patient is currently undergoing chemotherapy.   -Patient did have some mild anemia and neutropenia but did not require any transfusions. - she will Continue follow-up with the cancer Center.  4. DM type II w/out complication - in the hospital patient was on sliding scale insulin being discharged on oral metformin.  5. Hyperlipidemia - she will cont. Crestor  6. Depression - she will cont. Zoloft, Wellbutrin  7. Anxiety - she will cont. Klonopin.   DISCHARGE CONDITIONS:   Stable.   CONSULTS OBTAINED:    DRUG ALLERGIES:   Allergies  Allergen Reactions  . Macrobid [employer] Macro] Other (See Comments)    Reaction: unknown    DISCHARGE MEDICATIONS:     Medication List    TAKE these medications   ANORO ELLIPTA 62.5-25 MCG/INH Aepb Generic drug:  umeclidinium-vilanterol Inhale 1 puff into the lungs daily as needed (shortness of breath).   buPROPion 150 MG 24 hr tablet Commonly known as:  WELLBUTRIN XL Take 300 mg by mouth daily at 12 noon.   clonazePAM 0.5 MG tablet Commonly known as:  KLONOPIN Take 0.5 mg by mouth 2 (two) times daily.   Fish Oil 1000 MG Caps Take 1 capsule by mouth daily.   hydrocortisone 2.5 % rectal cream Commonly known as:  ANUSOL-HC Place 1 application rectally 2 (two) times daily as needed for hemorrhoids or itching.   hydrocortisone 25 MG suppository Commonly  known as:  ANUSOL-HC Place 1 suppository (25 mg total) rectally 2 (two) times daily.   LUMIGAN 0.01 % Soln Generic drug:  bimatoprost Place 1 drop into both eyes at bedtime.   magic mouthwash w/lidocaine Soln Take 5 mLs by mouth 4 (four) times daily. 80 ml viscous lidocaine 2%, 80 ml Mylanta, 80 ml Diphenhydramine 12.5 mg/5 ml Elixir, 80 ml Nystatin 100,000 Unit suspension, 80 ml Prednisolone 15 mg/21ml, 80 ml Distilled Water. Sig: Swish/Swallow 5-10 ml four times a day  as needed. Dispense 480 ml. 3RFs   metFORMIN 500 MG tablet Commonly known as:  GLUCOPHAGE TAKE 1 TABLET BY MOUTH DAILY   multivitamin with minerals Tabs tablet Take 1 tablet by mouth daily.   ondansetron 8 MG tablet Commonly known as:  ZOFRAN Take 1 tablet (8 mg total) by mouth 2 (two) times daily as needed (Nausea or vomiting).   polyethylene glycol packet Commonly known as:  MIRALAX / GLYCOLAX Take 17 g by mouth daily as needed for mild constipation.   prochlorperazine 10 MG tablet Commonly known as:  COMPAZINE Take 1 tablet (10 mg total) by mouth every 6 (six) hours as needed (Nausea or vomiting).   rosuvastatin 20 MG tablet Commonly known as:  CRESTOR Take 1 tablet (20 mg total) by mouth daily.   sertraline 100 MG tablet Commonly known as:  ZOLOFT Take 100 mg by mouth daily at 12 noon.         DISCHARGE INSTRUCTIONS:   DIET:  Cardiac diet and Diabetic diet  DISCHARGE CONDITION:  Stable  ACTIVITY:  Activity as tolerated  OXYGEN:  Home Oxygen: No.   Oxygen Delivery: room air  DISCHARGE LOCATION:  home   If you experience worsening of your admission symptoms, develop shortness of breath, life threatening emergency, suicidal or homicidal thoughts you must seek medical attention immediately by calling 911 or calling your MD immediately  if symptoms less severe.  You Must read complete instructions/literature along with all the possible adverse reactions/side effects for all the Medicines you take and that have been prescribed to you. Take any new Medicines after you have completely understood and accpet all the possible adverse reactions/side effects.   Please note  You were cared for by a hospitalist during your hospital stay. If you have any questions about your discharge medications or the care you received while you were in the hospital after you are discharged, you can call the unit and asked to speak with the hospitalist on call if the hospitalist that  took care of you is not available. Once you are discharged, your primary care physician will handle any further medical issues. Please note that NO REFILLS for any discharge medications will be authorized once you are discharged, as it is imperative that you return to your primary care physician (or establish a relationship with a primary care physician if you do not have one) for your aftercare needs so that they can reassess your need for medications and monitor your lab values.     Today   Still having some Rectal fullness but no N/V or rectal bleeding.  Hg. Stable.  Tolerating a soft diet well.    VITAL SIGNS:  Blood pressure (!) 137/59, pulse 78, temperature 98.5 F (36.9 C), temperature source Oral, resp. rate 20, height 5\' 5"  (1.651 m), weight 109.3 kg (241 lb), SpO2 94 %.  I/O:  No intake or output data in the 24 hours ending 05/11/16 1417  PHYSICAL EXAMINATION:   GENERAL:  74  y.o.-year-old patient lying in the bed with no acute distress.  EYES: Pupils equal, round, reactive to light and accommodation. No scleral icterus. Extraocular muscles intact.  HEENT: Head atraumatic, normocephalic. Oropharynx and nasopharynx clear.  NECK:  Supple, no jugular venous distention. No thyroid enlargement, no tenderness.  LUNGS: Normal breath sounds bilaterally, no wheezing, rales, rhonchi. No use of accessory muscles of respiration.  CARDIOVASCULAR: S1, S2 normal. No murmurs, rubs, or gallops.  ABDOMEN: Soft, nontender, Slightly distended. Bowel sounds Hypoactive. No organomegaly or mass.  EXTREMITIES: No cyanosis, clubbing or edema b/l.    NEUROLOGIC: Cranial nerves II through XII are intact. No focal Motor or sensory deficits b/l. Globally weak. PSYCHIATRIC: The patient is alert and oriented x 3.  SKIN: No obvious rash, lesion, or ulcer.   DATA REVIEW:   CBC  Recent Labs Lab 05/10/16 0417  WBC 1.7*  HGB 7.3*  HCT 21.4*  PLT 71*    Chemistries   Recent Labs Lab 05/07/16 1652   05/09/16 0531  NA  --   < > 140  K  --   < > 3.7  CL  --   < > 106  CO2  --   < > 32  GLUCOSE  --   < > 117*  BUN  --   < > 15  CREATININE  --   < > 1.00  CALCIUM  --   < > 8.6*  AST 26  --   --   ALT 25  --   --   ALKPHOS 65  --   --   BILITOT 0.6  --   --   < > = values in this interval not displayed.  Cardiac Enzymes  Recent Labs Lab 05/07/16 1652  TROPONINI <0.03    RADIOLOGY:  No results found.    Management plans discussed with the patient, family and they are in agreement.  CODE STATUS:  Code Status History    Date Active Date Inactive Code Status Order ID Comments User Context   05/07/2016 11:27 PM 05/10/2016  2:34 PM Full Code [id number]  Lance Coon, MD Inpatient      TOTAL TIME TAKING CARE OF THIS PATIENT: 40 minutes.    Henreitta Leber M.D on 05/11/2016 at 2:17 PM  Between 7am to 6pm - Pager - [phone]  After 6pm go to [email or web address] - Proofreader  [facility]  Office  [phone]  CC: Primary care physician; Keith Rake, MD

## 2016-05-14 ENCOUNTER — Inpatient Hospital Stay: Payer: Medicare Other | Attending: Internal Medicine

## 2016-05-14 DIAGNOSIS — K5900 Constipation, unspecified: Secondary | ICD-10-CM | POA: Diagnosis not present

## 2016-05-14 DIAGNOSIS — M1990 Unspecified osteoarthritis, unspecified site: Secondary | ICD-10-CM | POA: Insufficient documentation

## 2016-05-14 DIAGNOSIS — D4622 Refractory anemia with excess of blasts 2: Secondary | ICD-10-CM | POA: Insufficient documentation

## 2016-05-14 DIAGNOSIS — Z79899 Other long term (current) drug therapy: Secondary | ICD-10-CM | POA: Insufficient documentation

## 2016-05-14 DIAGNOSIS — R630 Anorexia: Secondary | ICD-10-CM | POA: Insufficient documentation

## 2016-05-14 DIAGNOSIS — E119 Type 2 diabetes mellitus without complications: Secondary | ICD-10-CM | POA: Insufficient documentation

## 2016-05-14 DIAGNOSIS — E785 Hyperlipidemia, unspecified: Secondary | ICD-10-CM | POA: Insufficient documentation

## 2016-05-14 DIAGNOSIS — R5383 Other fatigue: Secondary | ICD-10-CM | POA: Diagnosis not present

## 2016-05-14 DIAGNOSIS — Z792 Long term (current) use of antibiotics: Secondary | ICD-10-CM | POA: Insufficient documentation

## 2016-05-14 DIAGNOSIS — E669 Obesity, unspecified: Secondary | ICD-10-CM | POA: Insufficient documentation

## 2016-05-14 DIAGNOSIS — I10 Essential (primary) hypertension: Secondary | ICD-10-CM | POA: Diagnosis not present

## 2016-05-14 DIAGNOSIS — Z7984 Long term (current) use of oral hypoglycemic drugs: Secondary | ICD-10-CM | POA: Diagnosis not present

## 2016-05-14 DIAGNOSIS — R531 Weakness: Secondary | ICD-10-CM | POA: Diagnosis not present

## 2016-05-14 DIAGNOSIS — K1232 Oral mucositis (ulcerative) due to other drugs: Secondary | ICD-10-CM | POA: Diagnosis not present

## 2016-05-14 DIAGNOSIS — Z5111 Encounter for antineoplastic chemotherapy: Secondary | ICD-10-CM | POA: Insufficient documentation

## 2016-05-14 DIAGNOSIS — M549 Dorsalgia, unspecified: Secondary | ICD-10-CM | POA: Insufficient documentation

## 2016-05-14 DIAGNOSIS — D46Z Other myelodysplastic syndromes: Secondary | ICD-10-CM

## 2016-05-14 DIAGNOSIS — T451X5S Adverse effect of antineoplastic and immunosuppressive drugs, sequela: Secondary | ICD-10-CM | POA: Diagnosis not present

## 2016-05-14 DIAGNOSIS — Z87891 Personal history of nicotine dependence: Secondary | ICD-10-CM | POA: Insufficient documentation

## 2016-05-14 LAB — SAMPLE TO BLOOD BANK

## 2016-05-14 LAB — CBC WITH DIFFERENTIAL/PLATELET
Basophils Absolute: 0 10*3/uL (ref 0–0.1)
EOS ABS: 0 10*3/uL (ref 0–0.7)
HCT: 23.5 % — ABNORMAL LOW (ref 35.0–47.0)
HEMOGLOBIN: 8.1 g/dL — AB (ref 12.0–16.0)
Lymphocytes Relative: 42 %
Lymphs Abs: 1.3 10*3/uL (ref 1.0–3.6)
MCH: 34.8 pg — AB (ref 26.0–34.0)
MCHC: 34.5 g/dL (ref 32.0–36.0)
MCV: 100.7 fL — ABNORMAL HIGH (ref 80.0–100.0)
Monocytes Absolute: 0.2 10*3/uL (ref 0.2–0.9)
Neutro Abs: 1.6 10*3/uL (ref 1.4–6.5)
PLATELETS: 73 10*3/uL — AB (ref 150–440)
RBC: 2.33 MIL/uL — ABNORMAL LOW (ref 3.80–5.20)
RDW: 16.6 % — AB (ref 11.5–14.5)
WBC: 3.1 10*3/uL — ABNORMAL LOW (ref 3.6–11.0)

## 2016-05-20 ENCOUNTER — Other Ambulatory Visit: Payer: Self-pay

## 2016-05-20 ENCOUNTER — Inpatient Hospital Stay: Payer: Medicare Other

## 2016-05-20 DIAGNOSIS — K5900 Constipation, unspecified: Secondary | ICD-10-CM | POA: Diagnosis not present

## 2016-05-20 DIAGNOSIS — Z7984 Long term (current) use of oral hypoglycemic drugs: Secondary | ICD-10-CM | POA: Diagnosis not present

## 2016-05-20 DIAGNOSIS — Z792 Long term (current) use of antibiotics: Secondary | ICD-10-CM | POA: Diagnosis not present

## 2016-05-20 DIAGNOSIS — Z87891 Personal history of nicotine dependence: Secondary | ICD-10-CM | POA: Diagnosis not present

## 2016-05-20 DIAGNOSIS — D4622 Refractory anemia with excess of blasts 2: Secondary | ICD-10-CM | POA: Diagnosis not present

## 2016-05-20 DIAGNOSIS — Z79899 Other long term (current) drug therapy: Secondary | ICD-10-CM | POA: Diagnosis not present

## 2016-05-20 DIAGNOSIS — M549 Dorsalgia, unspecified: Secondary | ICD-10-CM | POA: Diagnosis not present

## 2016-05-20 DIAGNOSIS — K1232 Oral mucositis (ulcerative) due to other drugs: Secondary | ICD-10-CM | POA: Diagnosis not present

## 2016-05-20 DIAGNOSIS — E119 Type 2 diabetes mellitus without complications: Secondary | ICD-10-CM | POA: Diagnosis not present

## 2016-05-20 DIAGNOSIS — Z5111 Encounter for antineoplastic chemotherapy: Secondary | ICD-10-CM | POA: Diagnosis not present

## 2016-05-20 DIAGNOSIS — T451X5S Adverse effect of antineoplastic and immunosuppressive drugs, sequela: Secondary | ICD-10-CM | POA: Diagnosis not present

## 2016-05-20 DIAGNOSIS — R5383 Other fatigue: Secondary | ICD-10-CM | POA: Diagnosis not present

## 2016-05-20 DIAGNOSIS — E785 Hyperlipidemia, unspecified: Secondary | ICD-10-CM | POA: Diagnosis not present

## 2016-05-20 DIAGNOSIS — M1990 Unspecified osteoarthritis, unspecified site: Secondary | ICD-10-CM | POA: Diagnosis not present

## 2016-05-20 DIAGNOSIS — I10 Essential (primary) hypertension: Secondary | ICD-10-CM | POA: Diagnosis not present

## 2016-05-20 DIAGNOSIS — D46Z Other myelodysplastic syndromes: Secondary | ICD-10-CM

## 2016-05-20 DIAGNOSIS — R531 Weakness: Secondary | ICD-10-CM | POA: Diagnosis not present

## 2016-05-20 LAB — CBC WITH DIFFERENTIAL/PLATELET
BASOS ABS: 0 10*3/uL (ref 0–0.1)
Eosinophils Absolute: 0 10*3/uL (ref 0–0.7)
Eosinophils Relative: 0 %
HEMATOCRIT: 24.4 % — AB (ref 35.0–47.0)
HEMOGLOBIN: 8.3 g/dL — AB (ref 12.0–16.0)
Lymphocytes Relative: 51 %
Lymphs Abs: 1.2 10*3/uL (ref 1.0–3.6)
MCH: 34.7 pg — ABNORMAL HIGH (ref 26.0–34.0)
MCHC: 34.1 g/dL (ref 32.0–36.0)
MCV: 101.7 fL — ABNORMAL HIGH (ref 80.0–100.0)
Monocytes Absolute: 0.1 10*3/uL — ABNORMAL LOW (ref 0.2–0.9)
NEUTROS ABS: 1.1 10*3/uL — AB (ref 1.4–6.5)
Platelets: 53 10*3/uL — ABNORMAL LOW (ref 150–440)
RBC: 2.4 MIL/uL — AB (ref 3.80–5.20)
RDW: 17.3 % — ABNORMAL HIGH (ref 11.5–14.5)
WBC: 2.4 10*3/uL — AB (ref 3.6–11.0)

## 2016-05-20 LAB — SAMPLE TO BLOOD BANK

## 2016-05-27 ENCOUNTER — Inpatient Hospital Stay: Payer: Medicare Other

## 2016-05-27 ENCOUNTER — Inpatient Hospital Stay (HOSPITAL_BASED_OUTPATIENT_CLINIC_OR_DEPARTMENT_OTHER): Payer: Medicare Other | Admitting: Internal Medicine

## 2016-05-27 ENCOUNTER — Other Ambulatory Visit: Payer: Self-pay

## 2016-05-27 ENCOUNTER — Other Ambulatory Visit: Payer: Self-pay | Admitting: Family Medicine

## 2016-05-27 ENCOUNTER — Inpatient Hospital Stay (HOSPITAL_BASED_OUTPATIENT_CLINIC_OR_DEPARTMENT_OTHER): Payer: Medicare Other

## 2016-05-27 ENCOUNTER — Encounter (INDEPENDENT_AMBULATORY_CARE_PROVIDER_SITE_OTHER): Payer: Self-pay

## 2016-05-27 VITALS — BP 145/79 | HR 63 | Temp 97.6°F | Resp 20

## 2016-05-27 DIAGNOSIS — K1232 Oral mucositis (ulcerative) due to other drugs: Secondary | ICD-10-CM | POA: Diagnosis not present

## 2016-05-27 DIAGNOSIS — R5383 Other fatigue: Secondary | ICD-10-CM | POA: Diagnosis not present

## 2016-05-27 DIAGNOSIS — Z79899 Other long term (current) drug therapy: Secondary | ICD-10-CM

## 2016-05-27 DIAGNOSIS — E119 Type 2 diabetes mellitus without complications: Secondary | ICD-10-CM | POA: Diagnosis not present

## 2016-05-27 DIAGNOSIS — M549 Dorsalgia, unspecified: Secondary | ICD-10-CM

## 2016-05-27 DIAGNOSIS — Z5111 Encounter for antineoplastic chemotherapy: Secondary | ICD-10-CM | POA: Diagnosis not present

## 2016-05-27 DIAGNOSIS — D4622 Refractory anemia with excess of blasts 2: Secondary | ICD-10-CM

## 2016-05-27 DIAGNOSIS — E785 Hyperlipidemia, unspecified: Secondary | ICD-10-CM

## 2016-05-27 DIAGNOSIS — D46Z Other myelodysplastic syndromes: Secondary | ICD-10-CM

## 2016-05-27 DIAGNOSIS — T451X5S Adverse effect of antineoplastic and immunosuppressive drugs, sequela: Secondary | ICD-10-CM

## 2016-05-27 DIAGNOSIS — R630 Anorexia: Secondary | ICD-10-CM

## 2016-05-27 DIAGNOSIS — Z792 Long term (current) use of antibiotics: Secondary | ICD-10-CM

## 2016-05-27 DIAGNOSIS — K5900 Constipation, unspecified: Secondary | ICD-10-CM

## 2016-05-27 DIAGNOSIS — M1990 Unspecified osteoarthritis, unspecified site: Secondary | ICD-10-CM

## 2016-05-27 DIAGNOSIS — E669 Obesity, unspecified: Secondary | ICD-10-CM

## 2016-05-27 DIAGNOSIS — K1231 Oral mucositis (ulcerative) due to antineoplastic therapy: Secondary | ICD-10-CM

## 2016-05-27 DIAGNOSIS — I10 Essential (primary) hypertension: Secondary | ICD-10-CM

## 2016-05-27 DIAGNOSIS — Z7984 Long term (current) use of oral hypoglycemic drugs: Secondary | ICD-10-CM | POA: Diagnosis not present

## 2016-05-27 DIAGNOSIS — Z87891 Personal history of nicotine dependence: Secondary | ICD-10-CM | POA: Diagnosis not present

## 2016-05-27 DIAGNOSIS — R531 Weakness: Secondary | ICD-10-CM | POA: Diagnosis not present

## 2016-05-27 LAB — CBC WITH DIFFERENTIAL/PLATELET
Basophils Absolute: 0 10*3/uL (ref 0–0.1)
Basophils Relative: 1 %
EOS ABS: 0 10*3/uL (ref 0–0.7)
Eosinophils Relative: 0 %
HCT: 24.3 % — ABNORMAL LOW (ref 35.0–47.0)
HEMOGLOBIN: 8.3 g/dL — AB (ref 12.0–16.0)
LYMPHS ABS: 1 10*3/uL (ref 1.0–3.6)
MCH: 35.1 pg — AB (ref 26.0–34.0)
MCHC: 34 g/dL (ref 32.0–36.0)
MCV: 103.2 fL — AB (ref 80.0–100.0)
Monocytes Absolute: 0.1 10*3/uL — ABNORMAL LOW (ref 0.2–0.9)
Monocytes Relative: 4 %
NEUTROS ABS: 0.9 10*3/uL — AB (ref 1.4–6.5)
Platelets: 75 10*3/uL — ABNORMAL LOW (ref 150–440)
RBC: 2.36 MIL/uL — AB (ref 3.80–5.20)
RDW: 18.1 % — ABNORMAL HIGH (ref 11.5–14.5)
WBC: 1.9 10*3/uL — AB (ref 3.6–11.0)

## 2016-05-27 LAB — SAMPLE TO BLOOD BANK

## 2016-05-27 LAB — COMPREHENSIVE METABOLIC PANEL
ALBUMIN: 3.8 g/dL (ref 3.5–5.0)
ALT: 19 U/L (ref 14–54)
ANION GAP: 9 (ref 5–15)
AST: 23 U/L (ref 15–41)
Alkaline Phosphatase: 56 U/L (ref 38–126)
BUN: 17 mg/dL (ref 6–20)
CALCIUM: 8.9 mg/dL (ref 8.9–10.3)
CHLORIDE: 106 mmol/L (ref 101–111)
CO2: 26 mmol/L (ref 22–32)
Creatinine, Ser: 0.92 mg/dL (ref 0.44–1.00)
GFR calc non Af Amer: 60 mL/min — ABNORMAL LOW (ref >60)
GLUCOSE: 143 mg/dL — AB (ref 65–99)
POTASSIUM: 3.7 mmol/L (ref 3.5–5.1)
SODIUM: 141 mmol/L (ref 135–145)
Total Bilirubin: 0.4 mg/dL (ref 0.3–1.2)
Total Protein: 7.3 g/dL (ref 6.5–8.1)

## 2016-05-27 MED ORDER — ONDANSETRON HCL 4 MG PO TABS
8.0000 mg | ORAL_TABLET | Freq: Once | ORAL | Status: AC
  Administered 2016-05-27: 8 mg via ORAL
  Filled 2016-05-27: qty 2

## 2016-05-27 MED ORDER — MAGIC MOUTHWASH W/LIDOCAINE: 5.0000 mL | Freq: Three times a day (TID) | ORAL | 3 refills | Status: DC | PRN

## 2016-05-27 MED ORDER — AZACITIDINE CHEMO SQ INJECTION
75.0000 mg/m2 | Freq: Once | INTRAMUSCULAR | Status: AC
Start: 2016-05-27 — End: 2016-05-27
  Administered 2016-05-27: 165 mg via SUBCUTANEOUS
  Filled 2016-05-27 (×2): qty 6.6

## 2016-05-27 MED ORDER — ACYCLOVIR 400 MG PO TABS: 400.0000 mg | ORAL_TABLET | Freq: Two times a day (BID) | ORAL | 2 refills | Status: DC

## 2016-05-27 MED ORDER — FLUCONAZOLE 100 MG PO TABS: 100.0000 mg | ORAL_TABLET | Freq: Every day | ORAL | 3 refills | Status: DC

## 2016-05-27 NOTE — Assessment & Plan Note (Addendum)
Refractory anemia- with excess blasts- II [blasts percentage 14%] FISH- normal karyotype. On vidaza; s/p  cycle #1 today. [facility] eval on 9/25.   # Proceed with cycle #2 day #1 today. Tolerating well except for constipation. White count 1.9 ANC 0.9 white count 8.3 platelets 75  # Mucositis-G-1.  on magic mouth wash. New script given  # prophylactic antibiotics- acyclovir/ diflucan/ levaquin.   # constipation- miralax/ dulcolax.   # handicapped sticker.   # follow up with cycle #3. Labs - cbc-Tuesday/ Thursday- hold tube/ weekly bmp.

## 2016-05-27 NOTE — Progress Notes (Signed)
Dr. Rogue Bussing gave verbal order to continue with treatment today.  He is aware of ANC0.9.

## 2016-05-27 NOTE — Progress Notes (Signed)
Patient brought to exam room 16, accompanied by family. Patient denies pain or discomfort at this time but c/o dizziness when ambulating.  Vitals stable and documented

## 2016-05-27 NOTE — Progress Notes (Signed)
[facility] NOTE  Patient Care Team: Roselee Nova, [name]  CHIEF COMPLAINTS/PURPOSE OF CONSULTATION:   Oncology History   #JUNE 2017- Severe neutropenia/ Anemia ~hb 9/platlets- 85-100 AUG 2017- REFRACTORY ANEMIA with EXCESS BLASTS [14% blasts- BMBx]; cytogenetics/FISH-N [SNP micorarray- not done]   # AUG 21st-  START AZA 72m/m2 day- 1-7 q 28 days.      MDS (myelodysplastic syndrome), high grade ([place]   04/23/2016 Initial Diagnosis    MDS (myelodysplastic syndrome), high grade (HCC)         HISTORY OF PRESENTING ILLNESS:  [name]89y.o.  female with Recently diagnosed MDS/high-grade currently started on Vidaza Status post cycle #1 proximally 4 weeks ago is here for follow-up.  Patient was admitted to the hospital approximately 3 weeks ago for constipation- currently resolved.  Patient denies any nausea vomiting. Soreness of mouth improved with magic mouthwash. Otherwise denies any difficulty swallowing.  Patient continues to feel fatigued. Denies any new fevers. She feels weak. Poor appetite. No significant weight loss. Intermittent back pain. She denies any nosebleeds or gum bleeding.   ROS: A complete 10 point review of system is done which is negative except mentioned above in history of present illness  MEDICAL HISTORY:  Past Medical History:  Diagnosis Date  . Abdominal wall mass   . [place]inal pain ([place]   . Anxiety   . Arthritis   . Calculus of kidney   . Cystitis   . Depression   . Diabetes mellitus without complication (HCC)    elevated A1c  . Dyspnea on exertion   . Elevated serum creatinine   . Fibrocystic breast disease   . GERD (gastroesophageal reflux disease)   . Hearing loss   . Heart murmur   . HTN (hypertension)   . Hyperlipidemia   . MDS (myelodysplastic syndrome), high grade ([place] 04/23/2016  . Microscopic hematuria   . Mucositis due to chemotherapy   . Obesity   . Risk for falls    . Sleep apnea   . Thrombocytopenia ([place]   . Urinary frequency   . Urinary urgency     SURGICAL HISTORY: Past Surgical History:  Procedure Laterality Date  . ABDOMINAL HYSTERECTOMY    . APPENDECTOMY    . CARDIAC CATHETERIZATION     x2  . CHOLECYSTECTOMY    . COLONOSCOPY N/A 02/24/2015   Procedure: COLONOSCOPY;  Surgeon: RManya Silvas MD;  [facility]  Service: Endoscopy;  Laterality: N/A;  . DIAGNOSTIC LAPAROSCOPY     Removal of benign abdominal tumor  . ESOPHAGOGASTRODUODENOSCOPY N/A 02/24/2015   Procedure: ESOPHAGOGASTRODUODENOSCOPY (EGD);  Surgeon: RManya Silvas MD;  [facility];  Service: Endoscopy;  Laterality: N/A;  . right eye surgery Right     SOCIAL HISTORY: [place] family; [place] No smoking/ no alcohol.  Social History   Social History  . Marital status: Married    Spouse name: N/A  . Number of children: N/A  . Years of education: N/A   Occupational History  . Not on file.   Social History Main Topics  . Smoking status: Former Smoker    Quit date: 09/09/1988  . Smokeless tobacco: Never Used     Comment: quit 25 years ago  . Alcohol use No  . Drug use: No  . Sexual activity: Not on file   Other Topics Concern  . Not on file   Social History Narrative  .  No narrative on file    FAMILY HISTORY: no cancers in family.  Family History  Problem Relation Age of Onset  . Congestive Heart Failure Mother   . Diabetes Mother   . Coronary artery disease Mother   . Stroke Mother   . Cirrhosis Father     ALLERGIES:  is allergic to macrobid [nitrofurantoin monohyd macro].  MEDICATIONS:  Current Outpatient Prescriptions  Medication Sig Dispense Refill  . buPROPion (WELLBUTRIN XL) 150 MG 24 hr tablet Take 300 mg by mouth daily at 12 noon.     . clonazePAM (KLONOPIN) 0.5 MG tablet Take 0.5 mg by mouth 2 (two) times daily.     . hydrocortisone (ANUSOL-HC) 2.5 % rectal cream Place 1 application  rectally 2 (two) times daily as needed for hemorrhoids or itching. 30 g 0  . LUMIGAN 0.01 % SOLN Place 1 drop into both eyes at bedtime.     . metFORMIN (GLUCOPHAGE) 500 MG tablet TAKE 1 TABLET BY MOUTH DAILY 90 tablet 0  . Multiple Vitamin (MULTIVITAMIN WITH MINERALS) TABS tablet Take 1 tablet by mouth daily.    . ondansetron (ZOFRAN) 8 MG tablet Take 1 tablet (8 mg total) by mouth 2 (two) times daily as needed (Nausea or vomiting). 30 tablet 1  . polyethylene glycol (MIRALAX / GLYCOLAX) packet Take 17 g by mouth daily as needed for mild constipation. 14 each 1  . prochlorperazine (COMPAZINE) 10 MG tablet Take 1 tablet (10 mg total) by mouth every 6 (six) hours as needed (Nausea or vomiting). 30 tablet 1  . rosuvastatin (CRESTOR) 20 MG tablet Take 1 tablet (20 mg total) by mouth daily. 90 tablet 1  . sertraline (ZOLOFT) 100 MG tablet Take 100 mg by mouth daily at 12 noon.     Marland Kitchen Umeclidinium-Vilanterol (ANORO ELLIPTA) 62.5-25 MCG/INH AEPB Inhale 1 puff into the lungs daily as needed (shortness of breath).     Marland Kitchen acyclovir (ZOVIRAX) 400 MG tablet Take 1 tablet (400 mg total) by mouth 2 (two) times daily. 120 tablet 2  . fluconazole (DIFLUCAN) 100 MG tablet Take 1 tablet (100 mg total) by mouth daily. 30 tablet 3  . magic mouthwash w/lidocaine SOLN Take 5 mLs by mouth 3 (three) times daily as needed for mouth pain. 80 ml viscous lidocaine 2%, 80 ml Mylanta, 80 ml Diphenhydramine 12.5 mg/5 ml Elixir, 80 ml Nystatin 100,000 Unit suspension, 80 ml Prednisolone 15 mg/77m, 80 ml Distilled Water. Sig: Swish/Swallow 5-10 ml four times a day as needed. Dispense 480 ml. 3RFs 480 mL 3  . Omega-3 Fatty Acids (FISH OIL) 1000 MG CAPS Take 1 capsule by mouth daily.     No current facility-administered medications for this visit.       .Marland Kitchen PHYSICAL EXAMINATION: ECOG PERFORMANCE STATUS: 1 - Symptomatic but completely ambulatory  Vitals:   05/27/16 0917  BP: (!) 148/51  Pulse: 71  Temp: 97.8 F (36.6 C)    Filed Weights   05/27/16 0917  Weight: 230 lb 12.8 oz (104.7 kg)    GENERAL: Well-nourished well-developed; Alert, no distress and comfortable.   Obese. Accompanied by Teresa Carrillo sister-in-law/husband. She is walking herself. EYES: Positive for pallor. OROPHARYNX: no thrush or ulceration;  NECK: supple, no masses felt LYMPH:  no palpable lymphadenopathy in the cervical, axillary or inguinal regions LUNGS: clear to auscultation and  No wheeze or crackles HEART/CVS: regular rate & rhythm and no murmurs; No lower extremity edema ABDOMEN: abdomen soft, non-tender and normal bowel sounds Musculoskeletal:no cyanosis of  digits and no clubbing  PSYCH: alert & oriented x 3 with fluent speech NEURO: no focal motor/sensory deficits SKIN:  no rashes or significant lesions  LABORATORY DATA:  I have reviewed the data as listed Lab Results  Component Value Date   WBC 1.9 (L) 05/27/2016   HGB 8.3 (L) 05/27/2016   HCT 24.3 (L) 05/27/2016   MCV 103.2 (H) 05/27/2016   PLT 75 (L) 05/27/2016    Recent Labs  04/23/16 1527  05/07/16 1652 05/08/16 0511 05/09/16 0531 05/27/16 0859  NA 140  < >  --  139 140 141  K 4.4  < >  --  4.2 3.7 3.7  CL 105  < >  --  102 106 106  CO2 29  < >  --  32 32 26  GLUCOSE 117*  < >  --  146* 117* 143*  BUN 21*  < >  --  _0 CREATININE 1.10*  < >  --  1.24* 1.00 0.92  CALCIUM 9.2  < >  --  9.0 8.6* 8.9  GFRNONAA 48*  < >  --  42* 54* 60*  GFRAA 56*  < >  --  48* >60 >60  PROT 7.6  --  8.3*  --   --  7.3  ALBUMIN 3.9  --  3.8  --   --  3.8  AST 20  --  26  --   --  23  ALT 19  --  25  --   --  19  ALKPHOS 61  --  65  --   --  56  BILITOT 0.3  --  0.6  --   --  0.4  BILIDIR  --   --  0.1  --   --   --   IBILI  --   --  0.5  --   --   --   < > = values in this interval not displayed.  RADIOGRAPHIC STUDIES: I have personally reviewed the radiological images as listed and agreed with the findings in the report. Dg Abd 1 View  Result Date:  05/09/2016 CLINICAL DATA:  Abdominal pain and distention.  Constipation. EXAM: ABDOMEN - 1 VIEW COMPARISON:  CT, 05/07/2016 FINDINGS: Normal bowel gas pattern.  No significant increased stool. No convincing renal or ureteral stones. There are few small vascular calcifications in the pelvis. Two surgical vascular clips in the right upper quadrant reflect a prior cholecystectomy. There are calcified granuloma along the inferior margin of the spleen. Soft tissues are otherwise unremarkable. Lung bases are clear. There are degenerative changes of the visualized spine. IMPRESSION: 1. No acute findings. No evidence of bowel obstruction. No significant increased stool in the colon. Electronically Signed   By: Lajean Manes M.D.   On: 05/09/2016 13:33   Dg Abdomen 1 View  Result Date: 05/07/2016 CLINICAL DATA:  75 y/o F; 3 days of constipation. History of bone marrow cancer starting chemo last Monday. History of cholecystectomy. EXAM: ABDOMEN - 1 VIEW COMPARISON:  05/08/2015 abdomen radiographs. FINDINGS: The mildly dilated air-filled loop of colon projects over the lower abdomen. No pneumoperitoneum is identified. Degenerative changes of the lower lumbar spine. Cholecystectomy clips. IMPRESSION: Mildly dilated loop of colon projects over lower abdomen and may represent ileus or obstruction. Electronically Signed   By: Kristine Garbe M.D.   On: 05/07/2016 18:13   Ct Abdomen Pelvis W Contrast  Result Date: 05/07/2016 CLINICAL DATA:  Constipation for 3 days.  Abdominal pain. Chemotherapy today for myelodysplastic syndrome. Bone marrow biopsy on 04/17/2016 EXAM: CT ABDOMEN AND PELVIS WITH CONTRAST TECHNIQUE: Multidetector CT imaging of the abdomen and pelvis was performed using the standard protocol following bolus administration of intravenous contrast. CONTRAST:  78m ISOVUE-300 IOPAMIDOL (ISOVUE-300) INJECTION 61% COMPARISON:  11/02/2014 hand radiographs from 05/07/2016 FINDINGS: Lower chest: Mild  cardiomegaly. Scarring in the posterior basal segment left lower lobe. Hepatobiliary: Punctate calcifications in the liver compatible with old granulomatous disease. Cholecystectomy. Pancreas: Unremarkable Spleen: Old granulomatous disease of the spleen. Adrenals/Urinary Tract: Adrenal glands unremarkable. Fluid density 1.1 cm lesion of the right mid kidney, image 35/2, not appreciably changed, likely a cyst. Several similar but smaller nonspecific lesions present along the right kidney lower pole in the left kidney. Most of these lesions are technically too small to characterize. 2 mm right kidney upper pole nonobstructive renal calculus, image 69/5. 2 by 3 mm right kidney lower pole nonobstructive calculus, image 56/5. 2 mm right kidney lower pole nonobstructive calculus, image 52/5. 1 mm left kidney lower pole nonobstructive calculus, image 60/5. Stomach/Bowel: Air fluid levels in the distal colon, with considerable wall thickening in the lower rectum and perirectal stranding this wall thickening extends down to the anorectal junction and the rectum is fluid-filled. Presacral edema noted. Vascular/Lymphatic: Just past the hiatus by the gastroesophageal junction there is a 1.0 cm in short axis lymph node on image 10/2, formerly 0.9 cm on 11/02/2014. Similar upper normal size lymph nodes are present along the gastrohepatic plate. Scattered small nodules adjacent to the spleen may represent small splenules or lymph nodes. Generally small retroperitoneal lymph nodes are observed. Aortoiliac atherosclerotic vascular disease. Reproductive: Uterus absent.  Ovaries normal. Other: Slightly lobulated 1.5 by 2.0 cm subcutaneous nodule along the left anterior upper abdomen, image 17/2, previously 1.8 by 1.6 cm by my measurements and similar morphology. Musculoskeletal: Facet arthropathy at all levels below L3 in the lumbar spine with loss of disc height at L3-4 and 3 mm degenerative retrolisthesis at L3-4. These factors  contribute to mild foraminal impingement at the L3-4 and L4-5 levels. There is mild spurring of femoral heads, left greater than right. IMPRESSION: 1. The dominant finding is notable abnormal wall thickening in the lower rectum extending to the anus. Associated fluid-filled rectum and distal air fluid levels in the large bowel indicating diarrheal process. The cause of this lower rectal and anorectal inflammation is uncertain but there is perirectal stranding and presacral stranding. No perirectal abscess or definite regional fistula is identified, the ischiorectal fossa appears normal bilaterally. 2. Bilateral nonobstructive nephrolithiasis. 3. Stable subcutaneous nodule along the left anterior upper abdomen, nonspecific but not appreciably changed from 11/02/2014. There is also stable borderline nodal enlargement near the gastroesophageal junction. 4. Other imaging findings of potential clinical significance: Mild cardiomegaly. Scarring in the left lower lobe. Old granulomatous disease of the liver and spleen. Scattered low-density lesions in both kidneys, some of which are cysts but some of which are technically too small to characterize. Aortoiliac atherosclerotic vascular disease. Facet arthropathy and disc disease in the lumbar spine contributing to mild foraminal impingement at L3-4 and L4-5. Mild degenerative spurring of both femoral heads. Electronically Signed   By: WVan ClinesM.D.   On: 05/07/2016 20:55    ASSESSMENT & PLAN:   MDS (myelodysplastic syndrome), high grade (HCC) Refractory anemia- with excess blasts- II [blasts percentage 14%] FISH- normal karyotype. On vidaza; s/p  cycle #1 today. [facility] eval on 9/25.   # Proceed with cycle #2  day #1 today. Tolerating well except for constipation. White count 1.9 ANC 0.9 white count 8.3 platelets 75  # Mucositis-G-1.  on magic mouth wash. New script given  # prophylactic antibiotics- acyclovir/ diflucan/ levaquin.   # constipation-  miralax/ dulcolax.   # handicapped sticker.   # follow up with cycle #3. Labs - cbc-Tuesday/ Thursday- hold tube/ weekly bmp.      Cammie Sickle, MD 05/27/2016 5:21 PM

## 2016-05-28 ENCOUNTER — Inpatient Hospital Stay: Payer: Medicare Other

## 2016-05-28 VITALS — BP 124/65 | HR 85 | Temp 97.0°F | Resp 20

## 2016-05-28 DIAGNOSIS — Z5111 Encounter for antineoplastic chemotherapy: Secondary | ICD-10-CM | POA: Diagnosis not present

## 2016-05-28 DIAGNOSIS — Z87891 Personal history of nicotine dependence: Secondary | ICD-10-CM | POA: Diagnosis not present

## 2016-05-28 DIAGNOSIS — R5383 Other fatigue: Secondary | ICD-10-CM | POA: Diagnosis not present

## 2016-05-28 DIAGNOSIS — E785 Hyperlipidemia, unspecified: Secondary | ICD-10-CM | POA: Diagnosis not present

## 2016-05-28 DIAGNOSIS — M549 Dorsalgia, unspecified: Secondary | ICD-10-CM | POA: Diagnosis not present

## 2016-05-28 DIAGNOSIS — Z79899 Other long term (current) drug therapy: Secondary | ICD-10-CM | POA: Diagnosis not present

## 2016-05-28 DIAGNOSIS — K5900 Constipation, unspecified: Secondary | ICD-10-CM | POA: Diagnosis not present

## 2016-05-28 DIAGNOSIS — E119 Type 2 diabetes mellitus without complications: Secondary | ICD-10-CM | POA: Diagnosis not present

## 2016-05-28 DIAGNOSIS — R531 Weakness: Secondary | ICD-10-CM | POA: Diagnosis not present

## 2016-05-28 DIAGNOSIS — I10 Essential (primary) hypertension: Secondary | ICD-10-CM | POA: Diagnosis not present

## 2016-05-28 DIAGNOSIS — M1990 Unspecified osteoarthritis, unspecified site: Secondary | ICD-10-CM | POA: Diagnosis not present

## 2016-05-28 DIAGNOSIS — T451X5S Adverse effect of antineoplastic and immunosuppressive drugs, sequela: Secondary | ICD-10-CM | POA: Diagnosis not present

## 2016-05-28 DIAGNOSIS — Z7984 Long term (current) use of oral hypoglycemic drugs: Secondary | ICD-10-CM | POA: Diagnosis not present

## 2016-05-28 DIAGNOSIS — K1232 Oral mucositis (ulcerative) due to other drugs: Secondary | ICD-10-CM | POA: Diagnosis not present

## 2016-05-28 DIAGNOSIS — D46Z Other myelodysplastic syndromes: Secondary | ICD-10-CM

## 2016-05-28 DIAGNOSIS — Z792 Long term (current) use of antibiotics: Secondary | ICD-10-CM | POA: Diagnosis not present

## 2016-05-28 DIAGNOSIS — D4622 Refractory anemia with excess of blasts 2: Secondary | ICD-10-CM | POA: Diagnosis not present

## 2016-05-28 MED ORDER — ONDANSETRON HCL 4 MG PO TABS
8.0000 mg | ORAL_TABLET | Freq: Once | ORAL | Status: AC
  Administered 2016-05-28: 8 mg via ORAL
  Filled 2016-05-28: qty 2

## 2016-05-28 MED ORDER — AZACITIDINE CHEMO SQ INJECTION
75.0000 mg/m2 | Freq: Once | INTRAMUSCULAR | Status: AC
  Administered 2016-05-28: 165 mg via SUBCUTANEOUS
  Filled 2016-05-28: qty 6.6

## 2016-05-29 ENCOUNTER — Inpatient Hospital Stay: Payer: Medicare Other

## 2016-05-29 VITALS — BP 119/71 | HR 73 | Temp 97.0°F | Resp 20

## 2016-05-29 DIAGNOSIS — K5900 Constipation, unspecified: Secondary | ICD-10-CM | POA: Diagnosis not present

## 2016-05-29 DIAGNOSIS — R531 Weakness: Secondary | ICD-10-CM | POA: Diagnosis not present

## 2016-05-29 DIAGNOSIS — R5383 Other fatigue: Secondary | ICD-10-CM | POA: Diagnosis not present

## 2016-05-29 DIAGNOSIS — T451X5S Adverse effect of antineoplastic and immunosuppressive drugs, sequela: Secondary | ICD-10-CM | POA: Diagnosis not present

## 2016-05-29 DIAGNOSIS — Z792 Long term (current) use of antibiotics: Secondary | ICD-10-CM | POA: Diagnosis not present

## 2016-05-29 DIAGNOSIS — E119 Type 2 diabetes mellitus without complications: Secondary | ICD-10-CM | POA: Diagnosis not present

## 2016-05-29 DIAGNOSIS — M1990 Unspecified osteoarthritis, unspecified site: Secondary | ICD-10-CM | POA: Diagnosis not present

## 2016-05-29 DIAGNOSIS — K1232 Oral mucositis (ulcerative) due to other drugs: Secondary | ICD-10-CM | POA: Diagnosis not present

## 2016-05-29 DIAGNOSIS — Z7984 Long term (current) use of oral hypoglycemic drugs: Secondary | ICD-10-CM | POA: Diagnosis not present

## 2016-05-29 DIAGNOSIS — Z5111 Encounter for antineoplastic chemotherapy: Secondary | ICD-10-CM | POA: Diagnosis not present

## 2016-05-29 DIAGNOSIS — Z87891 Personal history of nicotine dependence: Secondary | ICD-10-CM | POA: Diagnosis not present

## 2016-05-29 DIAGNOSIS — I10 Essential (primary) hypertension: Secondary | ICD-10-CM | POA: Diagnosis not present

## 2016-05-29 DIAGNOSIS — Z79899 Other long term (current) drug therapy: Secondary | ICD-10-CM | POA: Diagnosis not present

## 2016-05-29 DIAGNOSIS — D46Z Other myelodysplastic syndromes: Secondary | ICD-10-CM

## 2016-05-29 DIAGNOSIS — M549 Dorsalgia, unspecified: Secondary | ICD-10-CM | POA: Diagnosis not present

## 2016-05-29 DIAGNOSIS — E785 Hyperlipidemia, unspecified: Secondary | ICD-10-CM | POA: Diagnosis not present

## 2016-05-29 DIAGNOSIS — D4622 Refractory anemia with excess of blasts 2: Secondary | ICD-10-CM | POA: Diagnosis not present

## 2016-05-29 MED ORDER — AZACITIDINE CHEMO SQ INJECTION
75.0000 mg/m2 | Freq: Once | INTRAMUSCULAR | Status: AC
  Administered 2016-05-29: 165 mg via SUBCUTANEOUS
  Filled 2016-05-29: qty 6.6

## 2016-05-29 MED ORDER — ONDANSETRON HCL 4 MG PO TABS
8.0000 mg | ORAL_TABLET | Freq: Once | ORAL | Status: AC
  Administered 2016-05-29: 8 mg via ORAL
  Filled 2016-05-29: qty 2

## 2016-05-30 ENCOUNTER — Inpatient Hospital Stay: Payer: Medicare Other

## 2016-05-30 VITALS — BP 120/70 | HR 74 | Temp 97.5°F | Resp 20

## 2016-05-30 DIAGNOSIS — T451X5S Adverse effect of antineoplastic and immunosuppressive drugs, sequela: Secondary | ICD-10-CM | POA: Diagnosis not present

## 2016-05-30 DIAGNOSIS — E785 Hyperlipidemia, unspecified: Secondary | ICD-10-CM | POA: Diagnosis not present

## 2016-05-30 DIAGNOSIS — Z79899 Other long term (current) drug therapy: Secondary | ICD-10-CM | POA: Diagnosis not present

## 2016-05-30 DIAGNOSIS — R531 Weakness: Secondary | ICD-10-CM | POA: Diagnosis not present

## 2016-05-30 DIAGNOSIS — Z7984 Long term (current) use of oral hypoglycemic drugs: Secondary | ICD-10-CM | POA: Diagnosis not present

## 2016-05-30 DIAGNOSIS — D46Z Other myelodysplastic syndromes: Secondary | ICD-10-CM

## 2016-05-30 DIAGNOSIS — K1232 Oral mucositis (ulcerative) due to other drugs: Secondary | ICD-10-CM | POA: Diagnosis not present

## 2016-05-30 DIAGNOSIS — Z5111 Encounter for antineoplastic chemotherapy: Secondary | ICD-10-CM | POA: Diagnosis not present

## 2016-05-30 DIAGNOSIS — Z792 Long term (current) use of antibiotics: Secondary | ICD-10-CM | POA: Diagnosis not present

## 2016-05-30 DIAGNOSIS — R5383 Other fatigue: Secondary | ICD-10-CM | POA: Diagnosis not present

## 2016-05-30 DIAGNOSIS — Z87891 Personal history of nicotine dependence: Secondary | ICD-10-CM | POA: Diagnosis not present

## 2016-05-30 DIAGNOSIS — I10 Essential (primary) hypertension: Secondary | ICD-10-CM | POA: Diagnosis not present

## 2016-05-30 DIAGNOSIS — M549 Dorsalgia, unspecified: Secondary | ICD-10-CM | POA: Diagnosis not present

## 2016-05-30 DIAGNOSIS — E119 Type 2 diabetes mellitus without complications: Secondary | ICD-10-CM | POA: Diagnosis not present

## 2016-05-30 DIAGNOSIS — D4622 Refractory anemia with excess of blasts 2: Secondary | ICD-10-CM | POA: Diagnosis not present

## 2016-05-30 DIAGNOSIS — M1990 Unspecified osteoarthritis, unspecified site: Secondary | ICD-10-CM | POA: Diagnosis not present

## 2016-05-30 DIAGNOSIS — K5900 Constipation, unspecified: Secondary | ICD-10-CM | POA: Diagnosis not present

## 2016-05-30 MED ORDER — ONDANSETRON HCL 4 MG PO TABS
8.0000 mg | ORAL_TABLET | Freq: Once | ORAL | Status: AC
  Administered 2016-05-30: 8 mg via ORAL
  Filled 2016-05-30: qty 2

## 2016-05-30 MED ORDER — AZACITIDINE CHEMO SQ INJECTION
75.0000 mg/m2 | Freq: Once | INTRAMUSCULAR | Status: AC
  Administered 2016-05-30: 165 mg via SUBCUTANEOUS
  Filled 2016-05-30: qty 6.6

## 2016-05-31 ENCOUNTER — Inpatient Hospital Stay: Payer: Medicare Other

## 2016-05-31 VITALS — BP 116/69 | HR 85 | Temp 98.1°F | Resp 20

## 2016-05-31 DIAGNOSIS — D46Z Other myelodysplastic syndromes: Secondary | ICD-10-CM

## 2016-05-31 DIAGNOSIS — R5383 Other fatigue: Secondary | ICD-10-CM | POA: Diagnosis not present

## 2016-05-31 DIAGNOSIS — K5900 Constipation, unspecified: Secondary | ICD-10-CM | POA: Diagnosis not present

## 2016-05-31 DIAGNOSIS — M549 Dorsalgia, unspecified: Secondary | ICD-10-CM | POA: Diagnosis not present

## 2016-05-31 DIAGNOSIS — Z7984 Long term (current) use of oral hypoglycemic drugs: Secondary | ICD-10-CM | POA: Diagnosis not present

## 2016-05-31 DIAGNOSIS — D4622 Refractory anemia with excess of blasts 2: Secondary | ICD-10-CM | POA: Diagnosis not present

## 2016-05-31 DIAGNOSIS — K1232 Oral mucositis (ulcerative) due to other drugs: Secondary | ICD-10-CM | POA: Diagnosis not present

## 2016-05-31 DIAGNOSIS — Z5111 Encounter for antineoplastic chemotherapy: Secondary | ICD-10-CM | POA: Diagnosis not present

## 2016-05-31 DIAGNOSIS — Z87891 Personal history of nicotine dependence: Secondary | ICD-10-CM | POA: Diagnosis not present

## 2016-05-31 DIAGNOSIS — I10 Essential (primary) hypertension: Secondary | ICD-10-CM | POA: Diagnosis not present

## 2016-05-31 DIAGNOSIS — Z79899 Other long term (current) drug therapy: Secondary | ICD-10-CM | POA: Diagnosis not present

## 2016-05-31 DIAGNOSIS — E119 Type 2 diabetes mellitus without complications: Secondary | ICD-10-CM | POA: Diagnosis not present

## 2016-05-31 DIAGNOSIS — T451X5S Adverse effect of antineoplastic and immunosuppressive drugs, sequela: Secondary | ICD-10-CM | POA: Diagnosis not present

## 2016-05-31 DIAGNOSIS — R531 Weakness: Secondary | ICD-10-CM | POA: Diagnosis not present

## 2016-05-31 DIAGNOSIS — M1990 Unspecified osteoarthritis, unspecified site: Secondary | ICD-10-CM | POA: Diagnosis not present

## 2016-05-31 DIAGNOSIS — Z792 Long term (current) use of antibiotics: Secondary | ICD-10-CM | POA: Diagnosis not present

## 2016-05-31 DIAGNOSIS — E785 Hyperlipidemia, unspecified: Secondary | ICD-10-CM | POA: Diagnosis not present

## 2016-05-31 MED ORDER — ONDANSETRON HCL 4 MG PO TABS
8.0000 mg | ORAL_TABLET | Freq: Once | ORAL | Status: AC
  Administered 2016-05-31: 8 mg via ORAL
  Filled 2016-05-31: qty 2

## 2016-05-31 MED ORDER — AZACITIDINE CHEMO SQ INJECTION
75.0000 mg/m2 | Freq: Once | INTRAMUSCULAR | Status: AC
  Administered 2016-05-31: 165 mg via SUBCUTANEOUS
  Filled 2016-05-31: qty 6.6

## 2016-06-03 ENCOUNTER — Inpatient Hospital Stay: Payer: Medicare Other

## 2016-06-03 VITALS — BP 126/67 | HR 71 | Temp 97.0°F | Resp 20

## 2016-06-03 DIAGNOSIS — I10 Essential (primary) hypertension: Secondary | ICD-10-CM | POA: Diagnosis not present

## 2016-06-03 DIAGNOSIS — T451X5S Adverse effect of antineoplastic and immunosuppressive drugs, sequela: Secondary | ICD-10-CM | POA: Diagnosis not present

## 2016-06-03 DIAGNOSIS — K5900 Constipation, unspecified: Secondary | ICD-10-CM | POA: Diagnosis not present

## 2016-06-03 DIAGNOSIS — R5383 Other fatigue: Secondary | ICD-10-CM | POA: Diagnosis not present

## 2016-06-03 DIAGNOSIS — Z79899 Other long term (current) drug therapy: Secondary | ICD-10-CM | POA: Diagnosis not present

## 2016-06-03 DIAGNOSIS — D46Z Other myelodysplastic syndromes: Secondary | ICD-10-CM

## 2016-06-03 DIAGNOSIS — Z792 Long term (current) use of antibiotics: Secondary | ICD-10-CM | POA: Diagnosis not present

## 2016-06-03 DIAGNOSIS — E119 Type 2 diabetes mellitus without complications: Secondary | ICD-10-CM | POA: Diagnosis not present

## 2016-06-03 DIAGNOSIS — Z87891 Personal history of nicotine dependence: Secondary | ICD-10-CM | POA: Diagnosis not present

## 2016-06-03 DIAGNOSIS — K1232 Oral mucositis (ulcerative) due to other drugs: Secondary | ICD-10-CM | POA: Diagnosis not present

## 2016-06-03 DIAGNOSIS — M549 Dorsalgia, unspecified: Secondary | ICD-10-CM | POA: Diagnosis not present

## 2016-06-03 DIAGNOSIS — E785 Hyperlipidemia, unspecified: Secondary | ICD-10-CM | POA: Diagnosis not present

## 2016-06-03 DIAGNOSIS — Z7984 Long term (current) use of oral hypoglycemic drugs: Secondary | ICD-10-CM | POA: Diagnosis not present

## 2016-06-03 DIAGNOSIS — R531 Weakness: Secondary | ICD-10-CM | POA: Diagnosis not present

## 2016-06-03 DIAGNOSIS — Z5111 Encounter for antineoplastic chemotherapy: Secondary | ICD-10-CM | POA: Diagnosis not present

## 2016-06-03 DIAGNOSIS — D4622 Refractory anemia with excess of blasts 2: Secondary | ICD-10-CM | POA: Diagnosis not present

## 2016-06-03 DIAGNOSIS — M1990 Unspecified osteoarthritis, unspecified site: Secondary | ICD-10-CM | POA: Diagnosis not present

## 2016-06-03 LAB — CBC WITH DIFFERENTIAL/PLATELET
BASOS PCT: 1 %
Basophils Absolute: 0 10*3/uL (ref 0–0.1)
EOS ABS: 0 10*3/uL (ref 0–0.7)
EOS PCT: 0 %
HEMATOCRIT: 24.9 % — AB (ref 35.0–47.0)
Hemoglobin: 8.5 g/dL — ABNORMAL LOW (ref 12.0–16.0)
Lymphocytes Relative: 53 %
Lymphs Abs: 0.8 10*3/uL — ABNORMAL LOW (ref 1.0–3.6)
MCH: 34.9 pg — ABNORMAL HIGH (ref 26.0–34.0)
MCHC: 33.9 g/dL (ref 32.0–36.0)
MCV: 103 fL — ABNORMAL HIGH (ref 80.0–100.0)
MONO ABS: 0.1 10*3/uL — AB (ref 0.2–0.9)
MONOS PCT: 9 %
NEUTROS ABS: 0.6 10*3/uL — AB (ref 1.4–6.5)
Neutrophils Relative %: 37 %
PLATELETS: 148 10*3/uL — AB (ref 150–440)
RBC: 2.42 MIL/uL — ABNORMAL LOW (ref 3.80–5.20)
RDW: 18.3 % — AB (ref 11.5–14.5)
WBC: 1.5 10*3/uL — ABNORMAL LOW (ref 3.6–11.0)

## 2016-06-03 LAB — BASIC METABOLIC PANEL
Anion gap: 5 (ref 5–15)
BUN: 19 mg/dL (ref 6–20)
CHLORIDE: 104 mmol/L (ref 101–111)
CO2: 30 mmol/L (ref 22–32)
CREATININE: 1.06 mg/dL — AB (ref 0.44–1.00)
Calcium: 9.5 mg/dL (ref 8.9–10.3)
GFR calc Af Amer: 58 mL/min — ABNORMAL LOW (ref >60)
GFR, EST NON AFRICAN AMERICAN: 50 mL/min — AB (ref >60)
GLUCOSE: 172 mg/dL — AB (ref 65–99)
POTASSIUM: 4.4 mmol/L (ref 3.5–5.1)
Sodium: 139 mmol/L (ref 135–145)

## 2016-06-03 LAB — SAMPLE TO BLOOD BANK

## 2016-06-03 MED ORDER — ONDANSETRON HCL 4 MG PO TABS
8.0000 mg | ORAL_TABLET | Freq: Once | ORAL | Status: AC
  Administered 2016-06-03: 8 mg via ORAL
  Filled 2016-06-03: qty 2

## 2016-06-03 MED ORDER — AZACITIDINE CHEMO SQ INJECTION
75.0000 mg/m2 | Freq: Once | INTRAMUSCULAR | Status: AC
  Administered 2016-06-03: 165 mg via SUBCUTANEOUS
  Filled 2016-06-03: qty 6.6

## 2016-06-04 ENCOUNTER — Inpatient Hospital Stay: Payer: Medicare Other

## 2016-06-04 VITALS — BP 116/72 | HR 71 | Temp 97.2°F | Resp 20

## 2016-06-04 DIAGNOSIS — K5900 Constipation, unspecified: Secondary | ICD-10-CM | POA: Diagnosis not present

## 2016-06-04 DIAGNOSIS — Z5111 Encounter for antineoplastic chemotherapy: Secondary | ICD-10-CM | POA: Diagnosis not present

## 2016-06-04 DIAGNOSIS — K1232 Oral mucositis (ulcerative) due to other drugs: Secondary | ICD-10-CM | POA: Diagnosis not present

## 2016-06-04 DIAGNOSIS — Z79899 Other long term (current) drug therapy: Secondary | ICD-10-CM | POA: Diagnosis not present

## 2016-06-04 DIAGNOSIS — E119 Type 2 diabetes mellitus without complications: Secondary | ICD-10-CM | POA: Diagnosis not present

## 2016-06-04 DIAGNOSIS — E785 Hyperlipidemia, unspecified: Secondary | ICD-10-CM | POA: Diagnosis not present

## 2016-06-04 DIAGNOSIS — R531 Weakness: Secondary | ICD-10-CM | POA: Diagnosis not present

## 2016-06-04 DIAGNOSIS — Z87891 Personal history of nicotine dependence: Secondary | ICD-10-CM | POA: Diagnosis not present

## 2016-06-04 DIAGNOSIS — Z7984 Long term (current) use of oral hypoglycemic drugs: Secondary | ICD-10-CM | POA: Diagnosis not present

## 2016-06-04 DIAGNOSIS — T451X5S Adverse effect of antineoplastic and immunosuppressive drugs, sequela: Secondary | ICD-10-CM | POA: Diagnosis not present

## 2016-06-04 DIAGNOSIS — M549 Dorsalgia, unspecified: Secondary | ICD-10-CM | POA: Diagnosis not present

## 2016-06-04 DIAGNOSIS — Z792 Long term (current) use of antibiotics: Secondary | ICD-10-CM | POA: Diagnosis not present

## 2016-06-04 DIAGNOSIS — D4622 Refractory anemia with excess of blasts 2: Secondary | ICD-10-CM | POA: Diagnosis not present

## 2016-06-04 DIAGNOSIS — R5383 Other fatigue: Secondary | ICD-10-CM | POA: Diagnosis not present

## 2016-06-04 DIAGNOSIS — M1990 Unspecified osteoarthritis, unspecified site: Secondary | ICD-10-CM | POA: Diagnosis not present

## 2016-06-04 DIAGNOSIS — I10 Essential (primary) hypertension: Secondary | ICD-10-CM | POA: Diagnosis not present

## 2016-06-04 DIAGNOSIS — D46Z Other myelodysplastic syndromes: Secondary | ICD-10-CM

## 2016-06-04 MED ORDER — AZACITIDINE CHEMO SQ INJECTION
75.0000 mg/m2 | Freq: Once | INTRAMUSCULAR | Status: AC
  Administered 2016-06-04: 165 mg via SUBCUTANEOUS
  Filled 2016-06-04: qty 6.6

## 2016-06-04 MED ORDER — ONDANSETRON HCL 4 MG PO TABS
8.0000 mg | ORAL_TABLET | Freq: Once | ORAL | Status: AC
  Administered 2016-06-04: 8 mg via ORAL
  Filled 2016-06-04: qty 2

## 2016-06-05 DIAGNOSIS — D46Z Other myelodysplastic syndromes: Secondary | ICD-10-CM | POA: Diagnosis not present

## 2016-06-05 DIAGNOSIS — D469 Myelodysplastic syndrome, unspecified: Secondary | ICD-10-CM | POA: Diagnosis not present

## 2016-06-06 ENCOUNTER — Telehealth: Payer: Self-pay | Admitting: Internal Medicine

## 2016-06-06 ENCOUNTER — Inpatient Hospital Stay: Payer: Medicare Other

## 2016-06-06 DIAGNOSIS — T451X5S Adverse effect of antineoplastic and immunosuppressive drugs, sequela: Secondary | ICD-10-CM | POA: Diagnosis not present

## 2016-06-06 DIAGNOSIS — D4622 Refractory anemia with excess of blasts 2: Secondary | ICD-10-CM | POA: Diagnosis not present

## 2016-06-06 DIAGNOSIS — R5383 Other fatigue: Secondary | ICD-10-CM | POA: Diagnosis not present

## 2016-06-06 DIAGNOSIS — R531 Weakness: Secondary | ICD-10-CM | POA: Diagnosis not present

## 2016-06-06 DIAGNOSIS — Z7984 Long term (current) use of oral hypoglycemic drugs: Secondary | ICD-10-CM | POA: Diagnosis not present

## 2016-06-06 DIAGNOSIS — Z87891 Personal history of nicotine dependence: Secondary | ICD-10-CM | POA: Diagnosis not present

## 2016-06-06 DIAGNOSIS — K1232 Oral mucositis (ulcerative) due to other drugs: Secondary | ICD-10-CM | POA: Diagnosis not present

## 2016-06-06 DIAGNOSIS — Z792 Long term (current) use of antibiotics: Secondary | ICD-10-CM | POA: Diagnosis not present

## 2016-06-06 DIAGNOSIS — K5900 Constipation, unspecified: Secondary | ICD-10-CM | POA: Diagnosis not present

## 2016-06-06 DIAGNOSIS — Z5111 Encounter for antineoplastic chemotherapy: Secondary | ICD-10-CM | POA: Diagnosis not present

## 2016-06-06 DIAGNOSIS — I10 Essential (primary) hypertension: Secondary | ICD-10-CM | POA: Diagnosis not present

## 2016-06-06 DIAGNOSIS — M549 Dorsalgia, unspecified: Secondary | ICD-10-CM | POA: Diagnosis not present

## 2016-06-06 DIAGNOSIS — Z79899 Other long term (current) drug therapy: Secondary | ICD-10-CM | POA: Diagnosis not present

## 2016-06-06 DIAGNOSIS — E785 Hyperlipidemia, unspecified: Secondary | ICD-10-CM | POA: Diagnosis not present

## 2016-06-06 DIAGNOSIS — M1990 Unspecified osteoarthritis, unspecified site: Secondary | ICD-10-CM | POA: Diagnosis not present

## 2016-06-06 DIAGNOSIS — E119 Type 2 diabetes mellitus without complications: Secondary | ICD-10-CM | POA: Diagnosis not present

## 2016-06-06 DIAGNOSIS — D46Z Other myelodysplastic syndromes: Secondary | ICD-10-CM

## 2016-06-06 LAB — CBC WITH DIFFERENTIAL/PLATELET
Basophils Absolute: 0 10*3/uL (ref 0–0.1)
Basophils Relative: 1 %
EOS ABS: 0 10*3/uL (ref 0–0.7)
EOS PCT: 0 %
HCT: 23.7 % — ABNORMAL LOW (ref 35.0–47.0)
HEMOGLOBIN: 8.1 g/dL — AB (ref 12.0–16.0)
LYMPHS ABS: 0.8 10*3/uL — AB (ref 1.0–3.6)
Lymphocytes Relative: 47 %
MCH: 35 pg — AB (ref 26.0–34.0)
MCHC: 34 g/dL (ref 32.0–36.0)
MCV: 103.2 fL — ABNORMAL HIGH (ref 80.0–100.0)
MONOS PCT: 10 %
Monocytes Absolute: 0.2 10*3/uL (ref 0.2–0.9)
NEUTROS PCT: 42 %
Neutro Abs: 0.8 10*3/uL — ABNORMAL LOW (ref 1.4–6.5)
Platelets: 123 10*3/uL — ABNORMAL LOW (ref 150–440)
RBC: 2.3 MIL/uL — ABNORMAL LOW (ref 3.80–5.20)
RDW: 18.7 % — ABNORMAL HIGH (ref 11.5–14.5)
WBC: 1.8 10*3/uL — ABNORMAL LOW (ref 3.6–11.0)

## 2016-06-06 LAB — SAMPLE TO BLOOD BANK

## 2016-06-06 NOTE — Telephone Encounter (Signed)
Talked to Dr. Royce Macadamia; patient not a candidate for bone marrow transplant. Recommend continued treatment/ possible candidate for clinical trails in future.

## 2016-06-11 ENCOUNTER — Inpatient Hospital Stay: Payer: Medicare Other | Attending: Internal Medicine

## 2016-06-11 DIAGNOSIS — K1231 Oral mucositis (ulcerative) due to antineoplastic therapy: Secondary | ICD-10-CM | POA: Diagnosis not present

## 2016-06-11 DIAGNOSIS — Z79899 Other long term (current) drug therapy: Secondary | ICD-10-CM | POA: Diagnosis not present

## 2016-06-11 DIAGNOSIS — M1990 Unspecified osteoarthritis, unspecified site: Secondary | ICD-10-CM | POA: Diagnosis not present

## 2016-06-11 DIAGNOSIS — Z87891 Personal history of nicotine dependence: Secondary | ICD-10-CM | POA: Diagnosis not present

## 2016-06-11 DIAGNOSIS — K219 Gastro-esophageal reflux disease without esophagitis: Secondary | ICD-10-CM | POA: Diagnosis not present

## 2016-06-11 DIAGNOSIS — G4730 Sleep apnea, unspecified: Secondary | ICD-10-CM | POA: Diagnosis not present

## 2016-06-11 DIAGNOSIS — Z87442 Personal history of urinary calculi: Secondary | ICD-10-CM | POA: Diagnosis not present

## 2016-06-11 DIAGNOSIS — D4622 Refractory anemia with excess of blasts 2: Secondary | ICD-10-CM | POA: Diagnosis not present

## 2016-06-11 DIAGNOSIS — Z7984 Long term (current) use of oral hypoglycemic drugs: Secondary | ICD-10-CM | POA: Insufficient documentation

## 2016-06-11 DIAGNOSIS — F418 Other specified anxiety disorders: Secondary | ICD-10-CM | POA: Insufficient documentation

## 2016-06-11 DIAGNOSIS — T451X5S Adverse effect of antineoplastic and immunosuppressive drugs, sequela: Secondary | ICD-10-CM | POA: Insufficient documentation

## 2016-06-11 DIAGNOSIS — E119 Type 2 diabetes mellitus without complications: Secondary | ICD-10-CM | POA: Insufficient documentation

## 2016-06-11 DIAGNOSIS — Z87828 Personal history of other (healed) physical injury and trauma: Secondary | ICD-10-CM | POA: Diagnosis not present

## 2016-06-11 DIAGNOSIS — E785 Hyperlipidemia, unspecified: Secondary | ICD-10-CM | POA: Insufficient documentation

## 2016-06-11 DIAGNOSIS — I10 Essential (primary) hypertension: Secondary | ICD-10-CM | POA: Diagnosis not present

## 2016-06-11 DIAGNOSIS — K5900 Constipation, unspecified: Secondary | ICD-10-CM | POA: Diagnosis not present

## 2016-06-11 DIAGNOSIS — Z5111 Encounter for antineoplastic chemotherapy: Secondary | ICD-10-CM | POA: Diagnosis not present

## 2016-06-11 DIAGNOSIS — Z9181 History of falling: Secondary | ICD-10-CM | POA: Diagnosis not present

## 2016-06-11 DIAGNOSIS — D46Z Other myelodysplastic syndromes: Secondary | ICD-10-CM

## 2016-06-11 DIAGNOSIS — Z792 Long term (current) use of antibiotics: Secondary | ICD-10-CM | POA: Diagnosis not present

## 2016-06-11 LAB — CBC WITH DIFFERENTIAL/PLATELET
BASOS PCT: 1 %
Basophils Absolute: 0 10*3/uL (ref 0–0.1)
EOS ABS: 0 10*3/uL (ref 0–0.7)
Eosinophils Relative: 1 %
HEMATOCRIT: 25.4 % — AB (ref 35.0–47.0)
Hemoglobin: 8.7 g/dL — ABNORMAL LOW (ref 12.0–16.0)
Lymphocytes Relative: 43 %
Lymphs Abs: 1 10*3/uL (ref 1.0–3.6)
MCH: 35.1 pg — ABNORMAL HIGH (ref 26.0–34.0)
MCHC: 34.1 g/dL (ref 32.0–36.0)
MCV: 103 fL — ABNORMAL HIGH (ref 80.0–100.0)
MONO ABS: 0.2 10*3/uL (ref 0.2–0.9)
MONOS PCT: 8 %
NEUTROS ABS: 1.1 10*3/uL — AB (ref 1.4–6.5)
Neutrophils Relative %: 47 %
Platelets: 106 10*3/uL — ABNORMAL LOW (ref 150–440)
RBC: 2.47 MIL/uL — ABNORMAL LOW (ref 3.80–5.20)
RDW: 18.7 % — AB (ref 11.5–14.5)
WBC: 2.3 10*3/uL — ABNORMAL LOW (ref 3.6–11.0)

## 2016-06-11 LAB — BASIC METABOLIC PANEL
ANION GAP: 6 (ref 5–15)
BUN: 17 mg/dL (ref 6–20)
CALCIUM: 9.4 mg/dL (ref 8.9–10.3)
CO2: 28 mmol/L (ref 22–32)
CREATININE: 1.08 mg/dL — AB (ref 0.44–1.00)
Chloride: 104 mmol/L (ref 101–111)
GFR calc Af Amer: 57 mL/min — ABNORMAL LOW (ref >60)
GFR calc non Af Amer: 49 mL/min — ABNORMAL LOW (ref >60)
GLUCOSE: 133 mg/dL — AB (ref 65–99)
Potassium: 4.7 mmol/L (ref 3.5–5.1)
Sodium: 138 mmol/L (ref 135–145)

## 2016-06-11 LAB — SAMPLE TO BLOOD BANK

## 2016-06-13 ENCOUNTER — Inpatient Hospital Stay: Payer: Medicare Other

## 2016-06-13 DIAGNOSIS — K1231 Oral mucositis (ulcerative) due to antineoplastic therapy: Secondary | ICD-10-CM | POA: Diagnosis not present

## 2016-06-13 DIAGNOSIS — D46Z Other myelodysplastic syndromes: Secondary | ICD-10-CM

## 2016-06-13 DIAGNOSIS — Z87442 Personal history of urinary calculi: Secondary | ICD-10-CM | POA: Diagnosis not present

## 2016-06-13 DIAGNOSIS — Z9181 History of falling: Secondary | ICD-10-CM | POA: Diagnosis not present

## 2016-06-13 DIAGNOSIS — Z87891 Personal history of nicotine dependence: Secondary | ICD-10-CM | POA: Diagnosis not present

## 2016-06-13 DIAGNOSIS — K219 Gastro-esophageal reflux disease without esophagitis: Secondary | ICD-10-CM | POA: Diagnosis not present

## 2016-06-13 DIAGNOSIS — E785 Hyperlipidemia, unspecified: Secondary | ICD-10-CM | POA: Diagnosis not present

## 2016-06-13 DIAGNOSIS — T451X5S Adverse effect of antineoplastic and immunosuppressive drugs, sequela: Secondary | ICD-10-CM | POA: Diagnosis not present

## 2016-06-13 DIAGNOSIS — G4730 Sleep apnea, unspecified: Secondary | ICD-10-CM | POA: Diagnosis not present

## 2016-06-13 DIAGNOSIS — E119 Type 2 diabetes mellitus without complications: Secondary | ICD-10-CM | POA: Diagnosis not present

## 2016-06-13 DIAGNOSIS — Z792 Long term (current) use of antibiotics: Secondary | ICD-10-CM | POA: Diagnosis not present

## 2016-06-13 DIAGNOSIS — Z79899 Other long term (current) drug therapy: Secondary | ICD-10-CM | POA: Diagnosis not present

## 2016-06-13 DIAGNOSIS — I10 Essential (primary) hypertension: Secondary | ICD-10-CM | POA: Diagnosis not present

## 2016-06-13 DIAGNOSIS — D4622 Refractory anemia with excess of blasts 2: Secondary | ICD-10-CM | POA: Diagnosis not present

## 2016-06-13 DIAGNOSIS — Z5111 Encounter for antineoplastic chemotherapy: Secondary | ICD-10-CM | POA: Diagnosis not present

## 2016-06-13 DIAGNOSIS — Z87828 Personal history of other (healed) physical injury and trauma: Secondary | ICD-10-CM | POA: Diagnosis not present

## 2016-06-13 DIAGNOSIS — K5900 Constipation, unspecified: Secondary | ICD-10-CM | POA: Diagnosis not present

## 2016-06-13 DIAGNOSIS — M1990 Unspecified osteoarthritis, unspecified site: Secondary | ICD-10-CM | POA: Diagnosis not present

## 2016-06-13 DIAGNOSIS — Z7984 Long term (current) use of oral hypoglycemic drugs: Secondary | ICD-10-CM | POA: Diagnosis not present

## 2016-06-13 LAB — CBC WITH DIFFERENTIAL/PLATELET
BASOS ABS: 0 10*3/uL (ref 0–0.1)
Basophils Relative: 1 %
Eosinophils Absolute: 0 10*3/uL (ref 0–0.7)
Eosinophils Relative: 0 %
HEMATOCRIT: 24.4 % — AB (ref 35.0–47.0)
Hemoglobin: 8.4 g/dL — ABNORMAL LOW (ref 12.0–16.0)
LYMPHS PCT: 45 %
Lymphs Abs: 0.9 10*3/uL — ABNORMAL LOW (ref 1.0–3.6)
MCH: 35.2 pg — ABNORMAL HIGH (ref 26.0–34.0)
MCHC: 34.3 g/dL (ref 32.0–36.0)
MCV: 102.4 fL — AB (ref 80.0–100.0)
Monocytes Absolute: 0.1 10*3/uL — ABNORMAL LOW (ref 0.2–0.9)
Monocytes Relative: 5 %
NEUTROS ABS: 1 10*3/uL — AB (ref 1.4–6.5)
Neutrophils Relative %: 49 %
Platelets: 70 10*3/uL — ABNORMAL LOW (ref 150–440)
RBC: 2.38 MIL/uL — AB (ref 3.80–5.20)
RDW: 19.2 % — ABNORMAL HIGH (ref 11.5–14.5)
WBC: 2 10*3/uL — ABNORMAL LOW (ref 3.6–11.0)

## 2016-06-13 LAB — SAMPLE TO BLOOD BANK

## 2016-06-16 ENCOUNTER — Other Ambulatory Visit: Payer: Self-pay | Admitting: Family Medicine

## 2016-06-18 ENCOUNTER — Telehealth: Payer: Self-pay | Admitting: *Deleted

## 2016-06-18 ENCOUNTER — Inpatient Hospital Stay: Payer: Medicare Other

## 2016-06-18 ENCOUNTER — Other Ambulatory Visit: Payer: Self-pay | Admitting: Internal Medicine

## 2016-06-18 ENCOUNTER — Other Ambulatory Visit: Payer: Self-pay

## 2016-06-18 DIAGNOSIS — Z87828 Personal history of other (healed) physical injury and trauma: Secondary | ICD-10-CM | POA: Diagnosis not present

## 2016-06-18 DIAGNOSIS — D469 Myelodysplastic syndrome, unspecified: Secondary | ICD-10-CM

## 2016-06-18 DIAGNOSIS — T451X5S Adverse effect of antineoplastic and immunosuppressive drugs, sequela: Secondary | ICD-10-CM | POA: Diagnosis not present

## 2016-06-18 DIAGNOSIS — Z792 Long term (current) use of antibiotics: Secondary | ICD-10-CM | POA: Diagnosis not present

## 2016-06-18 DIAGNOSIS — M1990 Unspecified osteoarthritis, unspecified site: Secondary | ICD-10-CM | POA: Diagnosis not present

## 2016-06-18 DIAGNOSIS — Z87891 Personal history of nicotine dependence: Secondary | ICD-10-CM | POA: Diagnosis not present

## 2016-06-18 DIAGNOSIS — K1231 Oral mucositis (ulcerative) due to antineoplastic therapy: Secondary | ICD-10-CM | POA: Diagnosis not present

## 2016-06-18 DIAGNOSIS — K219 Gastro-esophageal reflux disease without esophagitis: Secondary | ICD-10-CM | POA: Diagnosis not present

## 2016-06-18 DIAGNOSIS — D46Z Other myelodysplastic syndromes: Secondary | ICD-10-CM

## 2016-06-18 DIAGNOSIS — Z5111 Encounter for antineoplastic chemotherapy: Secondary | ICD-10-CM | POA: Diagnosis not present

## 2016-06-18 DIAGNOSIS — Z7984 Long term (current) use of oral hypoglycemic drugs: Secondary | ICD-10-CM | POA: Diagnosis not present

## 2016-06-18 DIAGNOSIS — Z9181 History of falling: Secondary | ICD-10-CM | POA: Diagnosis not present

## 2016-06-18 DIAGNOSIS — K5900 Constipation, unspecified: Secondary | ICD-10-CM | POA: Diagnosis not present

## 2016-06-18 DIAGNOSIS — Z79899 Other long term (current) drug therapy: Secondary | ICD-10-CM | POA: Diagnosis not present

## 2016-06-18 DIAGNOSIS — E119 Type 2 diabetes mellitus without complications: Secondary | ICD-10-CM | POA: Diagnosis not present

## 2016-06-18 DIAGNOSIS — D4622 Refractory anemia with excess of blasts 2: Secondary | ICD-10-CM | POA: Diagnosis not present

## 2016-06-18 DIAGNOSIS — Z87442 Personal history of urinary calculi: Secondary | ICD-10-CM | POA: Diagnosis not present

## 2016-06-18 DIAGNOSIS — G4730 Sleep apnea, unspecified: Secondary | ICD-10-CM | POA: Diagnosis not present

## 2016-06-18 DIAGNOSIS — I10 Essential (primary) hypertension: Secondary | ICD-10-CM | POA: Diagnosis not present

## 2016-06-18 DIAGNOSIS — E785 Hyperlipidemia, unspecified: Secondary | ICD-10-CM | POA: Diagnosis not present

## 2016-06-18 LAB — CBC WITH DIFFERENTIAL/PLATELET
Basophils Absolute: 0 10*3/uL (ref 0–0.1)
Basophils Relative: 1 %
EOS PCT: 2 %
Eosinophils Absolute: 0 10*3/uL (ref 0–0.7)
HCT: 23.8 % — ABNORMAL LOW (ref 35.0–47.0)
HEMOGLOBIN: 8.2 g/dL — AB (ref 12.0–16.0)
LYMPHS ABS: 1.1 10*3/uL (ref 1.0–3.6)
LYMPHS PCT: 48 %
MCH: 35.1 pg — AB (ref 26.0–34.0)
MCHC: 34.5 g/dL (ref 32.0–36.0)
MCV: 101.5 fL — AB (ref 80.0–100.0)
Monocytes Absolute: 0.1 10*3/uL — ABNORMAL LOW (ref 0.2–0.9)
Monocytes Relative: 6 %
NEUTROS PCT: 43 %
Neutro Abs: 1 10*3/uL — ABNORMAL LOW (ref 1.4–6.5)
PLATELETS: DECREASED 10*3/uL (ref 150–440)
RBC: 2.34 MIL/uL — AB (ref 3.80–5.20)
RDW: 18.4 % — ABNORMAL HIGH (ref 11.5–14.5)
WBC: 2.3 10*3/uL — AB (ref 3.6–11.0)

## 2016-06-18 LAB — PLATELET COUNT

## 2016-06-18 LAB — BASIC METABOLIC PANEL
ANION GAP: 8 (ref 5–15)
BUN: 16 mg/dL (ref 6–20)
CALCIUM: 9.1 mg/dL (ref 8.9–10.3)
CO2: 26 mmol/L (ref 22–32)
CREATININE: 0.93 mg/dL (ref 0.44–1.00)
Chloride: 104 mmol/L (ref 101–111)
GFR, EST NON AFRICAN AMERICAN: 59 mL/min — AB (ref >60)
Glucose, Bld: 117 mg/dL — ABNORMAL HIGH (ref 65–99)
Potassium: 4.4 mmol/L (ref 3.5–5.1)
SODIUM: 138 mmol/L (ref 135–145)

## 2016-06-18 LAB — SAMPLE TO BLOOD BANK

## 2016-06-18 NOTE — Telephone Encounter (Signed)
rcvd call from [facility] lab. repeat plt count unable to process. still clumping in warm citrate tube.    Pt denies any signs of bleeding. She was instructed to keep her lab apt on Thursday for lab recheck and her apts next week with Dr. Rogue Bussing. plt clumping unable to confirm results. Prelim report unconfirmed counts were approximately 25

## 2016-06-18 NOTE — Telephone Encounter (Signed)
Asking for results of labs this morning, there was platelet clumping so unable to get accurate platelet count, Patient asked to come back for a redrawn and has agreed to that Redraw needs to be drawn in a citrate tube

## 2016-06-19 ENCOUNTER — Observation Stay
Admission: EM | Admit: 2016-06-19 | Discharge: 2016-06-20 | Disposition: A | Discharge status: Home / Self Care | Payer: Medicare Other | Attending: Internal Medicine | Admitting: Internal Medicine

## 2016-06-19 DIAGNOSIS — K068 Other specified disorders of gingiva and edentulous alveolar ridge: Principal | ICD-10-CM | POA: Insufficient documentation

## 2016-06-19 DIAGNOSIS — F329 Major depressive disorder, single episode, unspecified: Secondary | ICD-10-CM | POA: Diagnosis not present

## 2016-06-19 DIAGNOSIS — Z79899 Other long term (current) drug therapy: Secondary | ICD-10-CM | POA: Insufficient documentation

## 2016-06-19 DIAGNOSIS — Z792 Long term (current) use of antibiotics: Secondary | ICD-10-CM | POA: Diagnosis not present

## 2016-06-19 DIAGNOSIS — W228XXA Striking against or struck by other objects, initial encounter: Secondary | ICD-10-CM | POA: Insufficient documentation

## 2016-06-19 DIAGNOSIS — J449 Chronic obstructive pulmonary disease, unspecified: Secondary | ICD-10-CM | POA: Insufficient documentation

## 2016-06-19 DIAGNOSIS — Z87891 Personal history of nicotine dependence: Secondary | ICD-10-CM | POA: Diagnosis not present

## 2016-06-19 DIAGNOSIS — R58 Hemorrhage, not elsewhere classified: Secondary | ICD-10-CM | POA: Diagnosis not present

## 2016-06-19 DIAGNOSIS — Z6841 Body Mass Index (BMI) 40.0 and over, adult: Secondary | ICD-10-CM | POA: Diagnosis not present

## 2016-06-19 DIAGNOSIS — H409 Unspecified glaucoma: Secondary | ICD-10-CM | POA: Diagnosis not present

## 2016-06-19 DIAGNOSIS — D696 Thrombocytopenia, unspecified: Secondary | ICD-10-CM | POA: Diagnosis not present

## 2016-06-19 DIAGNOSIS — E785 Hyperlipidemia, unspecified: Secondary | ICD-10-CM | POA: Insufficient documentation

## 2016-06-19 DIAGNOSIS — E669 Obesity, unspecified: Secondary | ICD-10-CM | POA: Diagnosis not present

## 2016-06-19 DIAGNOSIS — Z7984 Long term (current) use of oral hypoglycemic drugs: Secondary | ICD-10-CM | POA: Diagnosis not present

## 2016-06-19 DIAGNOSIS — F419 Anxiety disorder, unspecified: Secondary | ICD-10-CM | POA: Diagnosis not present

## 2016-06-19 DIAGNOSIS — E119 Type 2 diabetes mellitus without complications: Secondary | ICD-10-CM | POA: Insufficient documentation

## 2016-06-19 DIAGNOSIS — L03115 Cellulitis of right lower limb: Secondary | ICD-10-CM

## 2016-06-19 DIAGNOSIS — D61818 Other pancytopenia: Secondary | ICD-10-CM | POA: Diagnosis not present

## 2016-06-19 DIAGNOSIS — I10 Essential (primary) hypertension: Secondary | ICD-10-CM | POA: Insufficient documentation

## 2016-06-19 DIAGNOSIS — D46Z Other myelodysplastic syndromes: Secondary | ICD-10-CM | POA: Insufficient documentation

## 2016-06-19 DIAGNOSIS — D469 Myelodysplastic syndrome, unspecified: Secondary | ICD-10-CM | POA: Diagnosis not present

## 2016-06-19 DIAGNOSIS — S80811A Abrasion, right lower leg, initial encounter: Secondary | ICD-10-CM | POA: Insufficient documentation

## 2016-06-19 LAB — CBC
HEMATOCRIT: 26 % — AB (ref 35.0–47.0)
Hemoglobin: 8.8 g/dL — ABNORMAL LOW (ref 12.0–16.0)
MCH: 34.6 pg — ABNORMAL HIGH (ref 26.0–34.0)
MCHC: 33.7 g/dL (ref 32.0–36.0)
MCV: 102.7 fL — ABNORMAL HIGH (ref 80.0–100.0)
PLATELETS: UNDETERMINED 10*3/uL (ref 150–440)
RBC: 2.54 MIL/uL — ABNORMAL LOW (ref 3.80–5.20)
RDW: 18.8 % — AB (ref 11.5–14.5)
WBC: 2.4 10*3/uL — AB (ref 3.6–11.0)

## 2016-06-19 LAB — COMPREHENSIVE METABOLIC PANEL
ALK PHOS: 85 U/L (ref 38–126)
ALT: 29 U/L (ref 14–54)
AST: 29 U/L (ref 15–41)
Albumin: 4.1 g/dL (ref 3.5–5.0)
Anion gap: 9 (ref 5–15)
BILIRUBIN TOTAL: 0.4 mg/dL (ref 0.3–1.2)
BUN: 14 mg/dL (ref 6–20)
CALCIUM: 9.5 mg/dL (ref 8.9–10.3)
CO2: 26 mmol/L (ref 22–32)
CREATININE: 0.91 mg/dL (ref 0.44–1.00)
Chloride: 106 mmol/L (ref 101–111)
Glucose, Bld: 119 mg/dL — ABNORMAL HIGH (ref 65–99)
Potassium: 3.9 mmol/L (ref 3.5–5.1)
Sodium: 141 mmol/L (ref 135–145)
TOTAL PROTEIN: 7.9 g/dL (ref 6.5–8.1)

## 2016-06-19 LAB — CBC WITH DIFFERENTIAL/PLATELET
BASOS ABS: 0 10*3/uL (ref 0–0.1)
BASOS PCT: 1 %
EOS ABS: 0.2 10*3/uL (ref 0–0.7)
Eosinophils Relative: 11 %
HCT: 26.8 % — ABNORMAL LOW (ref 35.0–47.0)
Hemoglobin: 9.2 g/dL — ABNORMAL LOW (ref 12.0–16.0)
Lymphocytes Relative: 42 %
Lymphs Abs: 0.9 10*3/uL — ABNORMAL LOW (ref 1.0–3.6)
MCH: 34.8 pg — AB (ref 26.0–34.0)
MCHC: 34.4 g/dL (ref 32.0–36.0)
MCV: 101.3 fL — ABNORMAL HIGH (ref 80.0–100.0)
MONO ABS: 0.1 10*3/uL — AB (ref 0.2–0.9)
Monocytes Relative: 3 %
NEUTROS PCT: 43 %
Neutro Abs: 0.9 10*3/uL — ABNORMAL LOW (ref 1.4–6.5)
RBC: 2.64 MIL/uL — AB (ref 3.80–5.20)
RDW: 18.4 % — ABNORMAL HIGH (ref 11.5–14.5)
WBC: 2.1 10*3/uL — AB (ref 3.6–11.0)

## 2016-06-19 LAB — TYPE AND SCREEN
ABO/RH(D): A POS
Antibody Screen: NEGATIVE

## 2016-06-19 LAB — GLUCOSE, CAPILLARY: GLUCOSE-CAPILLARY: 130 mg/dL — AB (ref 65–99)

## 2016-06-19 LAB — PLATELET COUNT: Platelets: 33 10*3/uL — ABNORMAL LOW (ref 150–440)

## 2016-06-19 MED ORDER — ACYCLOVIR 400 MG PO TABS
400.0000 mg | ORAL_TABLET | Freq: Two times a day (BID) | ORAL | Status: DC
  Filled 2016-06-19: qty 1

## 2016-06-19 MED ORDER — PROCHLORPERAZINE MALEATE 10 MG PO TABS: 10.0000 mg | ORAL_TABLET | Freq: Four times a day (QID) | ORAL | Status: DC | PRN

## 2016-06-19 MED ORDER — ACETAMINOPHEN 650 MG RE SUPP: 650.0000 mg | Freq: Four times a day (QID) | RECTAL | Status: DC | PRN

## 2016-06-19 MED ORDER — ACYCLOVIR 200 MG PO CAPS
400.0000 mg | ORAL_CAPSULE | Freq: Two times a day (BID) | ORAL | Status: DC
  Administered 2016-06-19 – 2016-06-20 (×2): 400 mg via ORAL
  Filled 2016-06-19 (×2): qty 2

## 2016-06-19 MED ORDER — FLUCONAZOLE 100 MG PO TABS
100.0000 mg | ORAL_TABLET | Freq: Every day | ORAL | Status: DC
  Administered 2016-06-19 – 2016-06-20 (×2): 100 mg via ORAL
  Filled 2016-06-19 (×3): qty 1

## 2016-06-19 MED ORDER — CLONAZEPAM 0.5 MG PO TABS
0.5000 mg | ORAL_TABLET | Freq: Two times a day (BID) | ORAL | Status: DC
  Administered 2016-06-19 – 2016-06-20 (×2): 0.5 mg via ORAL
  Filled 2016-06-19 (×2): qty 1

## 2016-06-19 MED ORDER — UMECLIDINIUM-VILANTEROL 62.5-25 MCG/INH IN AEPB
1.0000 | INHALATION_SPRAY | Freq: Every day | RESPIRATORY_TRACT | Status: DC | PRN
  Administered 2016-06-20: 09:00:00 1 via RESPIRATORY_TRACT
  Filled 2016-06-19: qty 14

## 2016-06-19 MED ORDER — INSULIN ASPART 100 UNIT/ML ~~LOC~~ SOLN: 0.0000 [IU] | Freq: Every day | SUBCUTANEOUS | Status: DC

## 2016-06-19 MED ORDER — METFORMIN HCL 500 MG PO TABS
500.0000 mg | ORAL_TABLET | Freq: Every day | ORAL | Status: DC
  Administered 2016-06-19: 500 mg via ORAL
  Filled 2016-06-19 (×2): qty 1

## 2016-06-19 MED ORDER — ACETAMINOPHEN 325 MG PO TABS: 650.0000 mg | ORAL_TABLET | Freq: Four times a day (QID) | ORAL | Status: DC | PRN

## 2016-06-19 MED ORDER — ONDANSETRON HCL 4 MG/2ML IJ SOLN: 4.0000 mg | Freq: Four times a day (QID) | INTRAMUSCULAR | Status: DC | PRN

## 2016-06-19 MED ORDER — SERTRALINE HCL 50 MG PO TABS
100.0000 mg | ORAL_TABLET | Freq: Every day | ORAL | Status: DC
  Administered 2016-06-19: 19:00:00 100 mg via ORAL
  Filled 2016-06-19: qty 2

## 2016-06-19 MED ORDER — SODIUM CHLORIDE 0.9 % IV SOLN
10.0000 mL/h | Freq: Once | INTRAVENOUS | Status: AC
  Administered 2016-06-19: 18:00:00 10 mL/h via INTRAVENOUS

## 2016-06-19 MED ORDER — BUPROPION HCL ER (XL) 300 MG PO TB24
300.0000 mg | ORAL_TABLET | Freq: Every day | ORAL | Status: DC
  Administered 2016-06-19: 300 mg via ORAL
  Filled 2016-06-19: qty 1

## 2016-06-19 MED ORDER — CLINDAMYCIN HCL 150 MG PO CAPS
300.0000 mg | ORAL_CAPSULE | Freq: Once | ORAL | Status: AC
  Administered 2016-06-19: 300 mg via ORAL
  Filled 2016-06-19: qty 2

## 2016-06-19 MED ORDER — ONDANSETRON HCL 4 MG PO TABS: 8.0000 mg | ORAL_TABLET | Freq: Two times a day (BID) | ORAL | Status: DC | PRN

## 2016-06-19 MED ORDER — CLINDAMYCIN HCL 300 MG PO CAPS: 300.0000 mg | ORAL_CAPSULE | Freq: Three times a day (TID) | ORAL | 0 refills | Status: DC

## 2016-06-19 MED ORDER — LATANOPROST 0.005 % OP SOLN
1.0000 [drp] | Freq: Every day | OPHTHALMIC | Status: DC
  Administered 2016-06-19: 1 [drp] via OPHTHALMIC
  Filled 2016-06-19 (×2): qty 2.5

## 2016-06-19 MED ORDER — INSULIN ASPART 100 UNIT/ML ~~LOC~~ SOLN
0.0000 [IU] | Freq: Three times a day (TID) | SUBCUTANEOUS | Status: DC
  Administered 2016-06-20: 08:00:00 1 [IU] via SUBCUTANEOUS
  Filled 2016-06-19: qty 1

## 2016-06-19 MED ORDER — ONDANSETRON HCL 4 MG PO TABS: 4.0000 mg | ORAL_TABLET | Freq: Four times a day (QID) | ORAL | Status: DC | PRN

## 2016-06-19 MED ORDER — ROSUVASTATIN CALCIUM 20 MG PO TABS
20.0000 mg | ORAL_TABLET | Freq: Every day | ORAL | Status: DC
  Administered 2016-06-19: 21:00:00 20 mg via ORAL
  Filled 2016-06-19: qty 1
  Filled 2016-06-19: qty 4

## 2016-06-19 NOTE — H&P (Addendum)
[place] [facility] [place] NAME: Teresa Carrillo    MR#:  [id number]  DATE OF BIRTH:  November 09, 1940  DATE OF ADMISSION:  06/19/2016  PRIMARY CARE PHYSICIAN: Keith Rake, MD   REQUESTING/REFERRING PHYSICIAN: Dr. Lisa Roca  CHIEF COMPLAINT:   Chief Complaint  Patient presents with  . Coagulation Disorder  Gum bleeding.    HISTORY OF PRESENT ILLNESS:  Teresa Carrillo  is a 75 y.o. female with a known history of Myelodysplasia, anxiety, hypertension, depression, diabetes, previous history of thrombocytopenia who presents to the hospital due to mucosal/, bleeding. Patient says she woke up this morning and her mouth was covered in blood and some clots. Patient says that she has been having bleeding from her gums ongoing all day today. Patient followed up at the cancer center and had a platelets count checked over the past few days but they have been clumped and therefore we did not have an accurate platelet count. She continues to have mucosal bleeding but has a normal hemoglobin and therefore hospitalists services were contacted further treatment and evaluation. Patient denies any shortness of breath, chest pain, nausea, vomiting, abdominal pain, fever, chills or any other associated symptoms presently.  PAST MEDICAL HISTORY:   Past Medical History:  Diagnosis Date  . Abdominal wall mass   . Anginal pain ([place])   . Anxiety   . Arthritis   . Calculus of kidney   . Cystitis   . Depression   . Diabetes mellitus without complication (HCC)    elevated A1c  . Dyspnea on exertion   . Elevated serum creatinine   . Fibrocystic breast disease   . GERD (gastroesophageal reflux disease)   . Hearing loss   . Heart murmur   . HTN (hypertension)   . Hyperlipidemia   . MDS (myelodysplastic syndrome), high grade ([place]) 04/23/2016  . Microscopic hematuria   . Mucositis due to chemotherapy   . Obesity   . Risk for falls   . Sleep apnea   . Thrombocytopenia ([place])    . Urinary frequency   . Urinary urgency     PAST SURGICAL HISTORY:   Past Surgical History:  Procedure Laterality Date  . ABDOMINAL HYSTERECTOMY    . APPENDECTOMY    . CARDIAC CATHETERIZATION     x2  . CHOLECYSTECTOMY    . COLONOSCOPY N/A 02/24/2015   Procedure: COLONOSCOPY;  Surgeon: Manya Silvas, MD;  Location: [facility] ENDOSCOPY;  Service: Endoscopy;  Laterality: N/A;  . DIAGNOSTIC LAPAROSCOPY     Removal of benign abdominal tumor  . ESOPHAGOGASTRODUODENOSCOPY N/A 02/24/2015   Procedure: ESOPHAGOGASTRODUODENOSCOPY (EGD);  Surgeon: Manya Silvas, MD;  Location: [facility] ENDOSCOPY;  Service: Endoscopy;  Laterality: N/A;  . right eye surgery Right     SOCIAL HISTORY:   Social History  Substance Use Topics  . Smoking status: Former Smoker    Quit date: 09/09/1988  . Smokeless tobacco: Never Used     Comment: quit 25 years ago  . Alcohol use No    FAMILY HISTORY:   Family History  Problem Relation Age of Onset  . Congestive Heart Failure Mother   . Diabetes Mother   . Coronary artery disease Mother   . Stroke Mother   . Cirrhosis Father     DRUG ALLERGIES:   Allergies  Allergen Reactions  . Macrobid [employer] Macro] Other (See Comments)    Reaction: unknown    REVIEW OF SYSTEMS:   Review of Systems  Constitutional: Negative for chills and fever.  HENT: Negative for congestion and tinnitus.   Eyes: Negative for blurred vision and double vision.  Respiratory: Negative for cough, shortness of breath and wheezing.   Cardiovascular: Negative for chest pain, orthopnea and PND.  Gastrointestinal: Negative for abdominal pain, diarrhea, nausea and vomiting.  Genitourinary: Negative for dysuria and hematuria.  Neurological: Negative for dizziness, sensory change and focal weakness.  All other systems reviewed and are negative.    MEDICATIONS AT HOME:   Prior to Admission medications   Medication Sig Start Date End Date Taking? Authorizing Provider   acyclovir (ZOVIRAX) 400 MG tablet Take 1 tablet (400 mg total) by mouth 2 (two) times daily. 05/27/16  Yes Cammie Sickle, MD  buPROPion (WELLBUTRIN XL) 150 MG 24 hr tablet Take 300 mg by mouth daily at 12 noon.    Yes Historical Provider, MD  clonazePAM (KLONOPIN) 0.5 MG tablet Take 0.5 mg by mouth 2 (two) times daily.    Yes Historical Provider, MD  fluconazole (DIFLUCAN) 100 MG tablet Take 1 tablet (100 mg total) by mouth daily. 05/27/16  Yes Cammie Sickle, MD  hydrocortisone (ANUSOL-HC) 2.5 % rectal cream Place 1 application rectally 2 (two) times daily as needed for hemorrhoids or itching. 05/10/16  Yes Henreitta Leber, MD  LUMIGAN 0.01 % SOLN Place 1 drop into both eyes at bedtime.  04/12/15  Yes Historical Provider, MD  metFORMIN (GLUCOPHAGE) 500 MG tablet TAKE 1 TABLET BY MOUTH DAILY 06/17/16  Yes Roselee Nova, MD  Multiple Vitamin (MULTIVITAMIN WITH MINERALS) TABS tablet Take 1 tablet by mouth daily.   Yes Historical Provider, MD  Omega-3 Fatty Acids (FISH OIL) 1000 MG CAPS Take 1 capsule by mouth daily.   Yes Historical Provider, MD  ondansetron (ZOFRAN) 8 MG tablet Take 1 tablet (8 mg total) by mouth 2 (two) times daily as needed (Nausea or vomiting). 04/23/16  Yes Cammie Sickle, MD  polyethylene glycol (MIRALAX / GLYCOLAX) packet Take 17 g by mouth daily as needed for mild constipation. 05/10/16  Yes Henreitta Leber, MD  prochlorperazine (COMPAZINE) 10 MG tablet Take 1 tablet (10 mg total) by mouth every 6 (six) hours as needed (Nausea or vomiting). 04/23/16  Yes Cammie Sickle, MD  rosuvastatin (CRESTOR) 20 MG tablet Take 1 tablet (20 mg total) by mouth daily. 02/21/16  Yes Roselee Nova, MD  sertraline (ZOLOFT) 100 MG tablet Take 100 mg by mouth daily at 12 noon.    Yes Historical Provider, MD  Umeclidinium-Vilanterol (ANORO ELLIPTA) 62.5-25 MCG/INH AEPB Inhale 1 puff into the lungs daily as needed (shortness of breath).  05/17/14  Yes Historical Provider, MD   clindamycin (CLEOCIN) 300 MG capsule Take 1 capsule (300 mg total) by mouth 3 (three) times daily. 06/19/16   Lisa Roca, MD      VITAL SIGNS:  Blood pressure (!) 154/56, pulse 80, temperature 98.4 F (36.9 C), temperature source Oral, resp. rate 18, height 5\' 4"  (1.626 m), weight 108 kg (238 lb), SpO2 97 %.  PHYSICAL EXAMINATION:  Physical Exam  GENERAL:  75 y.o.-year-old patient lying in the bed with in acute distress.  EYES: Pupils equal, round, reactive to light and accommodation. No scleral icterus. Extraocular muscles intact.  HEENT: Head atraumatic, normocephalic. Oropharynx and nasopharynx clear. No oropharyngeal erythema, moist oral mucosa, positive mucosal/gingival bleeding noted on the lower teeth on the right side. NECK:  Supple, no jugular venous distention. No thyroid enlargement, no tenderness.  LUNGS: Normal breath sounds bilaterally, no wheezing, rales, rhonchi. No use of accessory muscles of respiration.  CARDIOVASCULAR: S1, S2 RRR. No murmurs, rubs, gallops, clicks.  ABDOMEN: Soft, nontender, nondistended. Bowel sounds present. No organomegaly or mass.  EXTREMITIES: No pedal edema, cyanosis, or clubbing. + 2 pedal & radial pulses b/l.   NEUROLOGIC: Cranial nerves II through XII are intact. No focal Motor or sensory deficits appreciated b/l PSYCHIATRIC: The patient is alert and oriented x 3. Good affect.  SKIN: No obvious rash, lesion, or ulcer.   LABORATORY PANEL:   CBC  Recent Labs Lab 06/19/16 1408  WBC 2.4*  HGB 8.8*  HCT 26.0*  PLT PLATELET CLUMPS NOTED ON SMEAR, UNABLE TO ESTIMATE   ------------------------------------------------------------------------------------------------------------------  Chemistries   Recent Labs Lab 06/19/16 1048  NA 141  K 3.9  CL 106  CO2 26  GLUCOSE 119*  BUN 14  CREATININE 0.91  CALCIUM 9.5  AST 29  ALT 29  ALKPHOS 85  BILITOT 0.4    ------------------------------------------------------------------------------------------------------------------  Cardiac Enzymes No results for input(s): TROPONINI in the last 168 hours. ------------------------------------------------------------------------------------------------------------------  RADIOLOGY:  No results found.   IMPRESSION AND PLAN:   75 year old female with past medical history myelodysplasia, thrombocytopenia, hypertension, diabetes who presented to the hospital due to mucosal/gingival bleeding.   1. Mucosal/gingival bleeding-suspected to be secondary to thrombocytopenia probably from the patient's underlying myelodysplasia. -Patient's platelets counts have been undetectable as there are clumped. I will transfuse the patient 1 unit platelets and follow clinically.  -I will get a oncology consultation. Continue supportive care.  2. History of depression-continue Wellbutrin, Klonopin, Zoloft.  3. Diabetes type 2 without complication-continue metformin.  4. Hyperlipidemia-continue Crestor.  5. Glaucoma-continue latanoprost eyedrops  6. Hx of Myelodysplasia - Hg. Stable. Plt. Are clumped and not detected accurately.  - Hem/Onc consult. Transfuse platelets.   All the records are reviewed and case discussed with ED provider. Management plans discussed with the patient, family and they are in agreement.  CODE STATUS: Full  TOTAL TIME TAKING CARE OF THIS PATIENT: 45 minutes.    Henreitta Leber M.D on 06/19/2016 at 4:18 PM  Between 7am to 6pm - Pager - [phone]  After 6pm go to [email or web address] - password [email or web address] [facility]  [facility]  Office  [phone]  CC: Primary care physician; Keith Rake, MD

## 2016-06-19 NOTE — ED Triage Notes (Signed)
Pt states she is a bone marrow cancer pt under going chemo at present and woke with bleeding from her mouth, pt currently has bleeding gums.Marland Kitchen

## 2016-06-19 NOTE — ED Notes (Signed)
Pt provided cranberry juice and saltine crackers so antibiotic does not upset stomach.

## 2016-06-19 NOTE — ED Notes (Signed)
Pt fell on stairs 2 weeks ago and hit R shin. Noticed wound, pt states it has been getting better and healing. Pt leg red/purple around where pt hit leg.

## 2016-06-19 NOTE — ED Notes (Signed)
Blood bank called about type and screen tube not having sticker on tube. This RN received patient when blood had already been sent and relayed this to lab. This RN told lab if MD decided to order type and screen I would redraw blood

## 2016-06-19 NOTE — ED Notes (Signed)
Pt ambulatory to toilet and back.

## 2016-06-19 NOTE — ED Notes (Signed)
Patient gums no longer bleeding

## 2016-06-19 NOTE — ED Notes (Signed)
Pt gums bleeding again, pt using tissues to maintain it. Pt no longer being d/c but being admitted.   Pt states last chemo was 2.5 weeks ago, next chemo is upcoming Monday 10/16.

## 2016-06-19 NOTE — ED Provider Notes (Signed)
[facility] Emergency Department Provider Note ____________________________________________   I have reviewed the triage vital signs and the triage nursing note.  HISTORY  Chief Complaint Coagulation Disorder   Historian Patient  HPI Teresa Carrillo is a 75 y.o. female with myelodysplastic syndrome, and her pancytopenia, without a history of platelet transfusion or gingival bleeding. She presents today for gingival bleeding. She states that she woke up this morning and her mouth was full of blood and there is blood all over the pillow. She is bleeding from a spot on her right lower gum line. No known trauma. No other areas of bleeding.   Additionally, she does have a skin abrasion from knocking her right shin, and as of yesterday it started to be swollen and red and slightly more painful.  Denies black or bloody stools.    Past Medical History:  Diagnosis Date  . Abdominal wall mass   . Anginal pain ([place])   . Anxiety   . Arthritis   . Calculus of kidney   . Cystitis   . Depression   . Diabetes mellitus without complication (HCC)    elevated A1c  . Dyspnea on exertion   . Elevated serum creatinine   . Fibrocystic breast disease   . GERD (gastroesophageal reflux disease)   . Hearing loss   . Heart murmur   . HTN (hypertension)   . Hyperlipidemia   . MDS (myelodysplastic syndrome), high grade ([place]) 04/23/2016  . Microscopic hematuria   . Mucositis due to chemotherapy   . Obesity   . Risk for falls   . Sleep apnea   . Thrombocytopenia ([place])   . Urinary frequency   . Urinary urgency     Patient Active Problem List   Diagnosis Date Noted  . Proctitis 05/07/2016  . MDS (myelodysplastic syndrome), high grade ([place]) 04/23/2016  . Oral mucosal lesion 04/04/2016  . Anemia 02/21/2016  . Pancytopenia ([place]) 02/21/2016  . Tick-borne disease 02/21/2016  . Cough 02/19/2016  . Proteinuria 05/10/2015  . Microscopic hematuria 05/08/2015  . Renal stone  05/08/2015  . Hyperlipidemia 05/05/2015  . HTN (hypertension) 05/05/2015  . Arteriosclerosis of coronary artery 02/02/2015  . H/O elevated lipids 02/02/2015  . H/O: HTN (hypertension) 02/02/2015  . Diabetes mellitus, type 2 ([place]) 02/02/2015  . COPD, mild ([place]) 05/02/2014  . Breathlessness on exertion 05/02/2014  . Abnormal respiratory rate 05/02/2014    Past Surgical History:  Procedure Laterality Date  . ABDOMINAL HYSTERECTOMY    . APPENDECTOMY    . CARDIAC CATHETERIZATION     x2  . CHOLECYSTECTOMY    . COLONOSCOPY N/A 02/24/2015   Procedure: COLONOSCOPY;  Surgeon: Manya Silvas, MD;  Location: [facility] ENDOSCOPY;  Service: Endoscopy;  Laterality: N/A;  . DIAGNOSTIC LAPAROSCOPY     Removal of benign abdominal tumor  . ESOPHAGOGASTRODUODENOSCOPY N/A 02/24/2015   Procedure: ESOPHAGOGASTRODUODENOSCOPY (EGD);  Surgeon: Manya Silvas, MD;  Location: [facility] ENDOSCOPY;  Service: Endoscopy;  Laterality: N/A;  . right eye surgery Right     Prior to Admission medications   Medication Sig Start Date End Date Taking? Authorizing Provider  acyclovir (ZOVIRAX) 400 MG tablet Take 1 tablet (400 mg total) by mouth 2 (two) times daily. 05/27/16  Yes Cammie Sickle, MD  buPROPion (WELLBUTRIN XL) 150 MG 24 hr tablet Take 300 mg by mouth daily at 12 noon.    Yes Historical Provider, MD  clonazePAM (KLONOPIN) 0.5 MG tablet Take 0.5 mg by mouth 2 (two) times daily.  Yes Historical Provider, MD  fluconazole (DIFLUCAN) 100 MG tablet Take 1 tablet (100 mg total) by mouth daily. 05/27/16  Yes Cammie Sickle, MD  hydrocortisone (ANUSOL-HC) 2.5 % rectal cream Place 1 application rectally 2 (two) times daily as needed for hemorrhoids or itching. 05/10/16  Yes Henreitta Leber, MD  LUMIGAN 0.01 % SOLN Place 1 drop into both eyes at bedtime.  04/12/15  Yes Historical Provider, MD  metFORMIN (GLUCOPHAGE) 500 MG tablet TAKE 1 TABLET BY MOUTH DAILY 06/17/16  Yes Roselee Nova, MD  Multiple Vitamin  (MULTIVITAMIN WITH MINERALS) TABS tablet Take 1 tablet by mouth daily.   Yes Historical Provider, MD  Omega-3 Fatty Acids (FISH OIL) 1000 MG CAPS Take 1 capsule by mouth daily.   Yes Historical Provider, MD  ondansetron (ZOFRAN) 8 MG tablet Take 1 tablet (8 mg total) by mouth 2 (two) times daily as needed (Nausea or vomiting). 04/23/16  Yes Cammie Sickle, MD  polyethylene glycol (MIRALAX / GLYCOLAX) packet Take 17 g by mouth daily as needed for mild constipation. 05/10/16  Yes Henreitta Leber, MD  prochlorperazine (COMPAZINE) 10 MG tablet Take 1 tablet (10 mg total) by mouth every 6 (six) hours as needed (Nausea or vomiting). 04/23/16  Yes Cammie Sickle, MD  rosuvastatin (CRESTOR) 20 MG tablet Take 1 tablet (20 mg total) by mouth daily. 02/21/16  Yes Roselee Nova, MD  sertraline (ZOLOFT) 100 MG tablet Take 100 mg by mouth daily at 12 noon.    Yes Historical Provider, MD  Umeclidinium-Vilanterol (ANORO ELLIPTA) 62.5-25 MCG/INH AEPB Inhale 1 puff into the lungs daily as needed (shortness of breath).  05/17/14  Yes Historical Provider, MD  clindamycin (CLEOCIN) 300 MG capsule Take 1 capsule (300 mg total) by mouth 3 (three) times daily. 06/19/16   Lisa Roca, MD    Allergies  Allergen Reactions  . Macrobid [employer] Macro] Other (See Comments)    Reaction: unknown    Family History  Problem Relation Age of Onset  . Congestive Heart Failure Mother   . Diabetes Mother   . Coronary artery disease Mother   . Stroke Mother   . Cirrhosis Father     Social History Social History  Substance Use Topics  . Smoking status: Former Smoker    Quit date: 09/09/1988  . Smokeless tobacco: Never Used     Comment: quit 25 years ago  . Alcohol use No    Review of Systems  Constitutional: Negative for fever. Eyes: Negative for visual changes. ENT: Negative for sore throat. Cardiovascular: Negative for chest pain. Respiratory: Negative for shortness of  breath. Gastrointestinal: Negative for abdominal pain, vomiting and diarrhea. Genitourinary: Negative for dysuria. Musculoskeletal: Negative for back pain. Skin: Negative for rash. Neurological: Negative for headache. 10 point Review of Systems otherwise negative ____________________________________________   PHYSICAL EXAM:  VITAL SIGNS: ED Triage Vitals  Enc Vitals Group     BP 06/19/16 1033 (!) 146/67     Pulse Rate 06/19/16 1033 76     Resp 06/19/16 1033 18     Temp 06/19/16 1033 98.4 F (36.9 C)     Temp Source 06/19/16 1033 Oral     SpO2 06/19/16 1033 95 %     Weight 06/19/16 1033 238 lb (108 kg)     Height 06/19/16 1033 5\' 4"  (1.626 m)     Head Circumference --      Peak Flow --      Pain Score 06/19/16  1034 0     Pain Loc --      Pain Edu? --      Excl. in [place]? --      Constitutional: Alert and oriented. Well appearing and in no distress. HEENT   Head: Normocephalic and atraumatic.      Eyes: Conjunctivae are normal. Right eye Squinting.      Ears:         Nose: No congestion/rhinnorhea.   Mouth/Throat: She does have some missing teeth, but no evidence of obvious necrotizing gingivitis. She does have a spot where she may have had bleeding at the posterior gumline at the teeth.   Neck: No stridor. Cardiovascular/Chest: Normal rate, regular rhythm.  No murmurs, rubs, or gallops. Respiratory: Normal respiratory effort without tachypnea nor retractions. Breath sounds are clear and equal bilaterally. No wheezes/rales/rhonchi. Gastrointestinal: Soft. No distention, no guarding, no rebound. Nontender.   Obese Genitourinary/rectal:Deferred Musculoskeletal: Nontender with normal range of motion in all extremities. No joint effusions.  No lower extremity tenderness.  No edema. Neurologic:  Normal speech and language. No gross or focal neurologic deficits are appreciated. Skin:  Skin is warm, dry and intact. No rash noted. Psychiatric: Mood and affect are normal.  Speech and behavior are normal. Patient exhibits appropriate insight and judgment.   ____________________________________________  LABS (pertinent positives/negatives)  Labs Reviewed  COMPREHENSIVE METABOLIC PANEL - Abnormal; Notable for the following:       Result Value   Glucose, Bld 119 (*)    All other components within normal limits  CBC WITH DIFFERENTIAL/PLATELET - Abnormal; Notable for the following:    WBC 2.1 (*)    RBC 2.64 (*)    Hemoglobin 9.2 (*)    HCT 26.8 (*)    MCV 101.3 (*)    MCH 34.8 (*)    RDW 18.4 (*)    Neutro Abs 0.9 (*)    Lymphs Abs 0.9 (*)    Monocytes Absolute 0.1 (*)    All other components within normal limits  CBC - Abnormal; Notable for the following:    WBC 2.4 (*)    RBC 2.54 (*)    Hemoglobin 8.8 (*)    HCT 26.0 (*)    MCV 102.7 (*)    MCH 34.6 (*)    RDW 18.8 (*)    All other components within normal limits  PLATELET COUNT  PREPARE PLATELET PHERESIS    ____________________________________________    EKG I, Lisa Roca, MD, the attending physician have personally viewed and interpreted all ECGs.  None ____________________________________________  RADIOLOGY All Xrays were viewed by me. Imaging interpreted by Radiologist.  None __________________________________________  PROCEDURES  Procedure(s) performed: None  Critical Care performed: None  ____________________________________________   ED COURSE / ASSESSMENT AND PLAN  Pertinent labs & imaging results that were available during my care of the patient were reviewed by me and considered in my medical decision making (see chart for details).   Initially patient reports waking up with large amount of blood in her mouth coming from the gumline. She has been told that her platelet count is low.  Here in the ER it looked resolved are stable. After discussion with the oncologist/hematologist he did recommend trying a citrate to, but the lab still showed clumps April  without the ability to check the platelet count.  When the blood was stopped, we were going to send her home, but then I went back in there and she was oozing again. At this point I spoke  again with the oncologist/hematologist and he did recommend go ahead and transfuse platelets and admitted overnight. I obtained blood consent from the patient.  At this point, hemoglobin not dropping, and the recurrence is not too bad yet, but given possible very low platelets, patient will be treated.  CONSULTATIONS:  Dr. Rogue Bussing, oncology -- recommended citrate tube, then after still bleeding, recommends admission with platelet transfusion.  Hospitalist for admission.  Patient / Family / Caregiver informed of clinical course, medical decision-making process, and agree with plan.    ___________________________________________   FINAL CLINICAL IMPRESSION(S) / ED DIAGNOSES   Final diagnoses:  Bleeding gums  Cellulitis of right leg              Note: This dictation was prepared with Dragon dictation. Any transcriptional errors that result from this process are unintentional    Lisa Roca, MD 06/19/16 1606

## 2016-06-19 NOTE — ED Notes (Signed)
Called lab about platelet count. Stated that it is still clumping and will result in a few minutes. Stated there are more platelets than the machine is saying because of the clumping.

## 2016-06-19 NOTE — ED Notes (Signed)
Called blood bank to send blood bracelets to ED to re-draw type and screen.

## 2016-06-19 NOTE — ED Notes (Signed)
Attempted a second IV, vein blew.

## 2016-06-20 ENCOUNTER — Inpatient Hospital Stay: Payer: Medicare Other

## 2016-06-20 ENCOUNTER — Other Ambulatory Visit: Payer: Self-pay | Admitting: Internal Medicine

## 2016-06-20 DIAGNOSIS — D469 Myelodysplastic syndrome, unspecified: Secondary | ICD-10-CM | POA: Diagnosis not present

## 2016-06-20 DIAGNOSIS — D696 Thrombocytopenia, unspecified: Secondary | ICD-10-CM | POA: Diagnosis not present

## 2016-06-20 DIAGNOSIS — E119 Type 2 diabetes mellitus without complications: Secondary | ICD-10-CM | POA: Diagnosis not present

## 2016-06-20 LAB — BASIC METABOLIC PANEL
Anion gap: 6 (ref 5–15)
BUN: 20 mg/dL (ref 6–20)
CALCIUM: 9.1 mg/dL (ref 8.9–10.3)
CHLORIDE: 107 mmol/L (ref 101–111)
CO2: 29 mmol/L (ref 22–32)
CREATININE: 1.02 mg/dL — AB (ref 0.44–1.00)
GFR calc Af Amer: >60 mL/min (ref >60)
GFR calc non Af Amer: 53 mL/min — ABNORMAL LOW (ref >60)
Glucose, Bld: 119 mg/dL — ABNORMAL HIGH (ref 65–99)
Potassium: 4.7 mmol/L (ref 3.5–5.1)
SODIUM: 142 mmol/L (ref 135–145)

## 2016-06-20 LAB — CBC
HCT: 24.1 % — ABNORMAL LOW (ref 35.0–47.0)
Hemoglobin: 8.1 g/dL — ABNORMAL LOW (ref 12.0–16.0)
MCH: 34.7 pg — AB (ref 26.0–34.0)
MCHC: 33.6 g/dL (ref 32.0–36.0)
MCV: 103.1 fL — AB (ref 80.0–100.0)
PLATELETS: 45 10*3/uL — AB (ref 150–440)
RBC: 2.34 MIL/uL — ABNORMAL LOW (ref 3.80–5.20)
RDW: 18.8 % — AB (ref 11.5–14.5)
WBC: 2.5 10*3/uL — AB (ref 3.6–11.0)

## 2016-06-20 LAB — PREPARE PLATELET PHERESIS: UNIT DIVISION: 0

## 2016-06-20 LAB — GLUCOSE, CAPILLARY: GLUCOSE-CAPILLARY: 124 mg/dL — AB (ref 65–99)

## 2016-06-20 NOTE — Care Management Obs Status (Signed)
[place] NOTIFICATION   Patient Details  Name: Teresa Carrillo MRN: [id number] Date of Birth: 1941/04/09   Medicare Observation Status Notification Given:  Yes    Beau Fanny, RN 06/20/2016, 8:17 AM

## 2016-06-20 NOTE — Care Management (Signed)
Admitted to [facility] with the diagnosis of mucous gum bleeding under observation status. Lives with husband, Jenny Reichmann, [phone]). Seen Dr. Manuella Ghazi about 4 months ago. No home health. No skilled facility. No home oxygen. Uses no equipment to aid in ambulation. [place] about 2 weeks ago coming up the steps. "Too good an appetite." Takes care of all basic and instrumental activities of daily living herself, drives. Takes chemotherapy shots 4 times a day for the last 14 days = 56 shots. Will start again Monday. Husband will transport. Discharge to home today per Dr. Clelia Croft RN MSN CCM Care Management [phone]

## 2016-06-20 NOTE — Discharge Instructions (Signed)
° °  Cellulitis Cellulitis is an infection of the skin and the tissue beneath it. The infected area is usually red and tender. Cellulitis occurs most often in the arms and lower legs.  CAUSES  Cellulitis is caused by bacteria that enter the skin through cracks or cuts in the skin. The most common types of bacteria that cause cellulitis are staphylococci and streptococci. SIGNS AND SYMPTOMS   Redness and warmth.  Swelling.  Tenderness or pain.  Fever. DIAGNOSIS  Your health care provider can usually determine what is wrong based on a physical exam. Blood tests may also be done. TREATMENT  Treatment usually involves taking an antibiotic medicine. HOME CARE INSTRUCTIONS   Take your antibiotic medicine as directed by your health care provider. Finish the antibiotic even if you start to feel better.  Keep the infected arm or leg elevated to reduce swelling.  Apply a warm cloth to the affected area up to 4 times per day to relieve pain.  Take medicines only as directed by your health care provider.  Keep all follow-up visits as directed by your health care provider. SEEK MEDICAL CARE IF:   You notice red streaks coming from the infected area.  Your red area gets larger or turns dark in color.  Your bone or joint underneath the infected area becomes painful after the skin has healed.  Your infection returns in the same area or another area.  You notice a swollen bump in the infected area.  You develop new symptoms.  You have a fever. SEEK IMMEDIATE MEDICAL CARE IF:   You feel very sleepy.  You develop vomiting or diarrhea.  You have a general ill feeling (malaise) with muscle aches and pains.   This information is not intended to replace advice given to you by your health care provider. Make sure you discuss any questions you have with your health care provider.   Document Released: 06/05/2005 Document Revised: 05/17/2015 Document Reviewed: 11/11/2011 Elsevier  Interactive Patient Education [employer]. After discussion with your doctor, Dr. Rogue Bussing, he did not recommend any transfusions today. Your platelets were still clumping, and so accurate result was not found, however given that your bleeding is stopped, it was not recommended to do a transfusion. Next line next line return to the emergency department for any new or uncontrolled bleeding.

## 2016-06-20 NOTE — Progress Notes (Signed)
Pt for discharge home today. Dr hower saw pt.  No bleeding noted this am.  A/o. No resp distress. Some sob with increased exertion.  Instructions for discharge discussed with pt. meds discussed. Diet activity and f/u discussed. Verbalize understanding of discharge plans.

## 2016-06-20 NOTE — Discharge Summary (Signed)
[facility] NAME: Teresa Carrillo    MR#:  [id number]  DATE OF BIRTH:  1940/12/10  DATE OF ADMISSION:  06/19/2016 ADMITTING PHYSICIAN: Henreitta Leber, MD  DATE OF DISCHARGE: 06/20/16  PRIMARY CARE PHYSICIAN: Keith Rake, MD    ADMISSION DIAGNOSIS:  Bleeding gums [K06.8] Cellulitis of right leg [id number]  DISCHARGE DIAGNOSIS:  Thrombocytopenia  SECONDARY DIAGNOSIS:   Past Medical History:  Diagnosis Date  . Abdominal wall mass   . Anginal pain ([place])   . Anxiety   . Arthritis   . Calculus of kidney   . Cystitis   . Depression   . Diabetes mellitus without complication (HCC)    elevated A1c  . Dyspnea on exertion   . Elevated serum creatinine   . Fibrocystic breast disease   . GERD (gastroesophageal reflux disease)   . Hearing loss   . Heart murmur   . HTN (hypertension)   . Hyperlipidemia   . MDS (myelodysplastic syndrome), high grade ([place]) 04/23/2016  . Microscopic hematuria   . Mucositis due to chemotherapy   . Obesity   . Risk for falls   . Sleep apnea   . Thrombocytopenia ([place])   . Urinary frequency   . Urinary urgency     HOSPITAL COURSE:  Teresa Carrillo  is a 75 y.o. female admitted 06/19/2016 with chief complaint Coagulation Disorder . Please see H&P performed by Henreitta Leber, MD for further information. Patient presented with spontaneous mucosal bleeding has a known history of myelodysplasia with chronic thrombocytopenia followed by hematology/oncology as an outpatient. Her platelets have been followed as an outpatient have been clumping thus unable to get accurate measurements. She presented to the hospital as mentioned with spontaneous bleeding, received platelet transfusion bleeding has stopped her platelet level 45K on discharge.  In emergency room emergency physician notice bruise on right shin with minimal surrounding erythema and placed on clindamycin  DISCHARGE CONDITIONS:    Stable  CONSULTS OBTAINED:    DRUG ALLERGIES:   Allergies  Allergen Reactions  . Macrobid [employer] Macro] Other (See Comments)    Reaction: unknown    DISCHARGE MEDICATIONS:   Current Discharge Medication List    START taking these medications   Details  clindamycin (CLEOCIN) 300 MG capsule Take 1 capsule (300 mg total) by mouth 3 (three) times daily. Qty: 30 capsule, Refills: 0      CONTINUE these medications which have NOT CHANGED   Details  acyclovir (ZOVIRAX) 400 MG tablet Take 1 tablet (400 mg total) by mouth 2 (two) times daily. Qty: 120 tablet, Refills: 2   Associated Diagnoses: MDS (myelodysplastic syndrome), high grade (HCC)    buPROPion (WELLBUTRIN XL) 150 MG 24 hr tablet Take 300 mg by mouth daily at 12 noon.     clonazePAM (KLONOPIN) 0.5 MG tablet Take 0.5 mg by mouth 2 (two) times daily.     fluconazole (DIFLUCAN) 100 MG tablet Take 1 tablet (100 mg total) by mouth daily. Qty: 30 tablet, Refills: 3   Associated Diagnoses: MDS (myelodysplastic syndrome), high grade (HCC)    hydrocortisone (ANUSOL-HC) 2.5 % rectal cream Place 1 application rectally 2 (two) times daily as needed for hemorrhoids or itching. Qty: 30 g, Refills: 0    LUMIGAN 0.01 % SOLN Place 1 drop into both eyes at bedtime.     metFORMIN (GLUCOPHAGE) 500 MG tablet TAKE 1 TABLET BY MOUTH DAILY Qty: 90 tablet, Refills: 0    Multiple  Vitamin (MULTIVITAMIN WITH MINERALS) TABS tablet Take 1 tablet by mouth daily.    Omega-3 Fatty Acids (FISH OIL) 1000 MG CAPS Take 1 capsule by mouth daily.    ondansetron (ZOFRAN) 8 MG tablet Take 1 tablet (8 mg total) by mouth 2 (two) times daily as needed (Nausea or vomiting). Qty: 30 tablet, Refills: 1   Associated Diagnoses: MDS (myelodysplastic syndrome), high grade (HCC)    polyethylene glycol (MIRALAX / GLYCOLAX) packet Take 17 g by mouth daily as needed for mild constipation. Qty: 14 each, Refills: 1    prochlorperazine (COMPAZINE)  10 MG tablet Take 1 tablet (10 mg total) by mouth every 6 (six) hours as needed (Nausea or vomiting). Qty: 30 tablet, Refills: 1   Associated Diagnoses: MDS (myelodysplastic syndrome), high grade (HCC)    rosuvastatin (CRESTOR) 20 MG tablet Take 1 tablet (20 mg total) by mouth daily. Qty: 90 tablet, Refills: 1   Associated Diagnoses: Hyperlipidemia    sertraline (ZOLOFT) 100 MG tablet Take 100 mg by mouth daily at 12 noon.     Umeclidinium-Vilanterol (ANORO ELLIPTA) 62.5-25 MCG/INH AEPB Inhale 1 puff into the lungs daily as needed (shortness of breath).          DISCHARGE INSTRUCTIONS:    DIET:  Regular diet  DISCHARGE CONDITION:  Stable  ACTIVITY:  Activity as tolerated  OXYGEN:  Home Oxygen: No.   Oxygen Delivery: room air  DISCHARGE LOCATION:  home   If you experience worsening of your admission symptoms, develop shortness of breath, life threatening emergency, suicidal or homicidal thoughts you must seek medical attention immediately by calling 911 or calling your MD immediately  if symptoms less severe.  You Must read complete instructions/literature along with all the possible adverse reactions/side effects for all the Medicines you take and that have been prescribed to you. Take any new Medicines after you have completely understood and accpet all the possible adverse reactions/side effects.   Please note  You were cared for by a hospitalist during your hospital stay. If you have any questions about your discharge medications or the care you received while you were in the hospital after you are discharged, you can call the unit and asked to speak with the hospitalist on call if the hospitalist that took care of you is not available. Once you are discharged, your primary care physician will handle any further medical issues. Please note that NO REFILLS for any discharge medications will be authorized once you are discharged, as it is imperative that you return to your  primary care physician (or establish a relationship with a primary care physician if you do not have one) for your aftercare needs so that they can reassess your need for medications and monitor your lab values.    On the day of Discharge:   VITAL SIGNS:  Blood pressure (!) 148/54, pulse 73, temperature 98.2 F (36.8 C), temperature source Oral, resp. rate 16, height 5\' 4"  (1.626 m), weight 105.4 kg (232 lb 6.4 oz), SpO2 98 %.  I/O:   Intake/Output Summary (Last 24 hours) at 06/20/16 0909 Last data filed at 06/20/16 0737  Gross per 24 hour  Intake              528 ml  Output                0 ml  Net              528 ml    PHYSICAL EXAMINATION:  GENERAL:  75 y.o.-year-old patient lying in the bed with no acute distress.  EYES: Pupils equal, round, reactive to light and accommodation. No scleral icterus. Extraocular muscles intact.  HEENT: Head atraumatic, normocephalic. Oropharynx and nasopharynx clear.  NECK:  Supple, no jugular venous distention. No thyroid enlargement, no tenderness.  LUNGS: Normal breath sounds bilaterally, no wheezing, rales,rhonchi or crepitation. No use of accessory muscles of respiration.  CARDIOVASCULAR: S1, S2 normal. No murmurs, rubs, or gallops.  ABDOMEN: Soft, non-tender, non-distended. Bowel sounds present. No organomegaly or mass.  EXTREMITIES: No pedal edema, cyanosis, or clubbing.  NEUROLOGIC: Cranial nerves II through XII are intact. Muscle strength 5/5 in all extremities. Sensation intact. Gait not checked.  PSYCHIATRIC: The patient is alert and oriented x 3.  SKIN: Right shin area of ecchymosis with minimal surrounding erythema, evidence of healing otherwise No obvious rash, lesion, or ulcer.   DATA REVIEW:   CBC  Recent Labs Lab 06/20/16 0355  WBC 2.5*  HGB 8.1*  HCT 24.1*  PLT 45*    Chemistries   Recent Labs Lab 06/19/16 1048 06/20/16 0355  NA 141 142  K 3.9 4.7  CL 106 107  CO2 26 29  GLUCOSE 119* 119*  BUN 14 20   CREATININE 0.91 1.02*  CALCIUM 9.5 9.1  AST 29  --   ALT 29  --   ALKPHOS 85  --   BILITOT 0.4  --     Cardiac Enzymes No results for input(s): TROPONINI in the last 168 hours.  Microbiology Results  Results for orders placed or performed during the hospital encounter of 02/19/16  Culture, group A strep     Status: None   Collection Time: 02/19/16  3:21 PM  Result Value Ref Range Status   Specimen Description THROAT  Final   Special Requests NONE  Final   Culture   Final    NO GROUP A STREP (S.PYOGENES) ISOLATED Performed at [facility]    Report Status 02/22/2016 FINAL  Final  Culture, blood (Routine X 2) w Reflex to ID Panel     Status: None   Collection Time: 02/19/16  9:45 PM  Result Value Ref Range Status   Specimen Description BLOOD LEFT ASSIST CONTROL  Final   Special Requests BOTTLES DRAWN AEROBIC AND ANAEROBIC  14CC  Final   Culture NO GROWTH 8 DAYS  Final   Report Status 02/27/2016 FINAL  Final  Culture, blood (Routine X 2) w Reflex to ID Panel     Status: None   Collection Time: 02/19/16 10:15 PM  Result Value Ref Range Status   Specimen Description BLOOD RIGHT HAND  Final   Special Requests   Final    BOTTLES DRAWN AEROBIC AND ANAEROBIC  [id number], [place]   Culture NO GROWTH 8 DAYS  Final   Report Status 02/27/2016 FINAL  Final    RADIOLOGY:  No results found.   Management plans discussed with the patient, family and they are in agreement.  CODE STATUS:     Code Status Orders        Start     Ordered   06/19/16 1734  Full code  Continuous     06/19/16 1733    Code Status History    Date Active Date Inactive Code Status Order ID Comments User Context   05/07/2016 11:27 PM 05/10/2016  2:34 PM Full Code [id number]  Lance Coon, MD Inpatient      TOTAL TIME TAKING CARE OF THIS PATIENT: 33 minutes.  Jamilya Sarrazin,  Karenann Cai.D on 06/20/2016 at 9:09 AM  Between 7am to 6pm - Pager - [phone]  After 6pm go to [email or web address] - [facility]  [facility]  Office  [phone]  CC: Primary care physician; Keith Rake, MD

## 2016-06-21 ENCOUNTER — Encounter: Payer: Self-pay | Admitting: *Deleted

## 2016-06-24 ENCOUNTER — Inpatient Hospital Stay: Payer: Medicare Other

## 2016-06-24 ENCOUNTER — Encounter: Payer: Self-pay | Admitting: Internal Medicine

## 2016-06-24 ENCOUNTER — Other Ambulatory Visit: Payer: Self-pay

## 2016-06-24 ENCOUNTER — Inpatient Hospital Stay (HOSPITAL_BASED_OUTPATIENT_CLINIC_OR_DEPARTMENT_OTHER): Payer: Medicare Other | Admitting: Internal Medicine

## 2016-06-24 VITALS — BP 117/72 | HR 66 | Temp 96.7°F | Resp 17 | Ht 64.0 in | Wt 236.6 lb

## 2016-06-24 DIAGNOSIS — K219 Gastro-esophageal reflux disease without esophagitis: Secondary | ICD-10-CM | POA: Diagnosis not present

## 2016-06-24 DIAGNOSIS — Z87891 Personal history of nicotine dependence: Secondary | ICD-10-CM

## 2016-06-24 DIAGNOSIS — Z79899 Other long term (current) drug therapy: Secondary | ICD-10-CM

## 2016-06-24 DIAGNOSIS — G4730 Sleep apnea, unspecified: Secondary | ICD-10-CM | POA: Diagnosis not present

## 2016-06-24 DIAGNOSIS — D46Z Other myelodysplastic syndromes: Secondary | ICD-10-CM

## 2016-06-24 DIAGNOSIS — E785 Hyperlipidemia, unspecified: Secondary | ICD-10-CM | POA: Diagnosis not present

## 2016-06-24 DIAGNOSIS — K1231 Oral mucositis (ulcerative) due to antineoplastic therapy: Secondary | ICD-10-CM | POA: Diagnosis not present

## 2016-06-24 DIAGNOSIS — Z792 Long term (current) use of antibiotics: Secondary | ICD-10-CM

## 2016-06-24 DIAGNOSIS — T451X5S Adverse effect of antineoplastic and immunosuppressive drugs, sequela: Secondary | ICD-10-CM | POA: Diagnosis not present

## 2016-06-24 DIAGNOSIS — Z5111 Encounter for antineoplastic chemotherapy: Secondary | ICD-10-CM | POA: Diagnosis not present

## 2016-06-24 DIAGNOSIS — K5900 Constipation, unspecified: Secondary | ICD-10-CM

## 2016-06-24 DIAGNOSIS — Z7984 Long term (current) use of oral hypoglycemic drugs: Secondary | ICD-10-CM | POA: Diagnosis not present

## 2016-06-24 DIAGNOSIS — Z87828 Personal history of other (healed) physical injury and trauma: Secondary | ICD-10-CM

## 2016-06-24 DIAGNOSIS — M1990 Unspecified osteoarthritis, unspecified site: Secondary | ICD-10-CM | POA: Diagnosis not present

## 2016-06-24 DIAGNOSIS — D4622 Refractory anemia with excess of blasts 2: Secondary | ICD-10-CM

## 2016-06-24 DIAGNOSIS — I10 Essential (primary) hypertension: Secondary | ICD-10-CM

## 2016-06-24 DIAGNOSIS — Z87442 Personal history of urinary calculi: Secondary | ICD-10-CM | POA: Diagnosis not present

## 2016-06-24 DIAGNOSIS — Z9181 History of falling: Secondary | ICD-10-CM | POA: Diagnosis not present

## 2016-06-24 DIAGNOSIS — E119 Type 2 diabetes mellitus without complications: Secondary | ICD-10-CM | POA: Diagnosis not present

## 2016-06-24 DIAGNOSIS — G4733 Obstructive sleep apnea (adult) (pediatric): Secondary | ICD-10-CM | POA: Diagnosis not present

## 2016-06-24 DIAGNOSIS — F418 Other specified anxiety disorders: Secondary | ICD-10-CM

## 2016-06-24 LAB — COMPREHENSIVE METABOLIC PANEL
ALT: 21 U/L (ref 14–54)
ANION GAP: 8 (ref 5–15)
AST: 21 U/L (ref 15–41)
Albumin: 3.9 g/dL (ref 3.5–5.0)
Alkaline Phosphatase: 70 U/L (ref 38–126)
BUN: 28 mg/dL — AB (ref 6–20)
CHLORIDE: 107 mmol/L (ref 101–111)
CO2: 27 mmol/L (ref 22–32)
Calcium: 9.5 mg/dL (ref 8.9–10.3)
Creatinine, Ser: 1.01 mg/dL — ABNORMAL HIGH (ref 0.44–1.00)
GFR, EST NON AFRICAN AMERICAN: 53 mL/min — AB (ref >60)
Glucose, Bld: 127 mg/dL — ABNORMAL HIGH (ref 65–99)
POTASSIUM: 4.5 mmol/L (ref 3.5–5.1)
Sodium: 142 mmol/L (ref 135–145)
Total Bilirubin: 0.3 mg/dL (ref 0.3–1.2)
Total Protein: 7.4 g/dL (ref 6.5–8.1)

## 2016-06-24 LAB — CBC WITH DIFFERENTIAL/PLATELET
BASOS ABS: 0 10*3/uL (ref 0–0.1)
Basophils Relative: 1 %
EOS PCT: 0 %
Eosinophils Absolute: 0 10*3/uL (ref 0–0.7)
HCT: 24.6 % — ABNORMAL LOW (ref 35.0–47.0)
Hemoglobin: 8.3 g/dL — ABNORMAL LOW (ref 12.0–16.0)
LYMPHS PCT: 44 %
Lymphs Abs: 1 10*3/uL (ref 1.0–3.6)
MCH: 34.8 pg — ABNORMAL HIGH (ref 26.0–34.0)
MCHC: 33.5 g/dL (ref 32.0–36.0)
MCV: 103.9 fL — AB (ref 80.0–100.0)
MONO ABS: 0.1 10*3/uL — AB (ref 0.2–0.9)
Monocytes Relative: 4 %
Neutro Abs: 1.2 10*3/uL — ABNORMAL LOW (ref 1.4–6.5)
Neutrophils Relative %: 51 %
PLATELETS: 54 10*3/uL — AB (ref 150–440)
RBC: 2.37 MIL/uL — ABNORMAL LOW (ref 3.80–5.20)
RDW: 20 % — AB (ref 11.5–14.5)
WBC: 2.4 10*3/uL — ABNORMAL LOW (ref 3.6–11.0)

## 2016-06-24 LAB — SAMPLE TO BLOOD BANK

## 2016-06-24 MED ORDER — AZACITIDINE CHEMO SQ INJECTION
75.0000 mg/m2 | Freq: Once | INTRAMUSCULAR | Status: AC
  Administered 2016-06-24: 165 mg via SUBCUTANEOUS
  Filled 2016-06-24: qty 6.6

## 2016-06-24 MED ORDER — ONDANSETRON HCL 4 MG PO TABS
8.0000 mg | ORAL_TABLET | Freq: Once | ORAL | Status: AC
  Administered 2016-06-24: 8 mg via ORAL
  Filled 2016-06-24: qty 2

## 2016-06-24 NOTE — Progress Notes (Signed)
Teresa Carrillo: Roselee Nova, [name]  CHIEF COMPLAINTS/PURPOSE OF CONSULTATION:   Oncology History   #JUNE 2017- Severe neutropenia/ Anemia ~hb 9/platlets- 85-100 AUG 2017- REFRACTORY ANEMIA with EXCESS BLASTS [14% blasts- BMBx]; cytogenetics/FISH-N [SNP micorarray- not done]   # AUG 21st-  START AZA 4m/m2 day- 1-7 q 28 days.      MDS (myelodysplastic syndrome), high grade ([place]   04/23/2016 Initial Diagnosis    MDS (myelodysplastic syndrome), high grade (HCC)         HISTORY OF PRESENTING ILLNESS:  [name]89y.o.  female with Recently diagnosed MDS/high-grade currently started on Vidaza Status post cycle #2 proximally 4 weeks ago is here for follow-up.  In the interim patient was admitted to the hospital for gum bleeding; noted to have platelets around 35. Clumping was noted on peripheral smear. She received platelets. Currently resolved.  Most recently she fell down at home mechanical fall hurt her right foot; ablation improving. She is on antibiotics.   Patient denies any nausea vomiting. Soreness of mouth improved with magic mouthwash. Denies any fevers. Denies any chills. Continues to chronic fatigue. Not any worse.  ROS: A c is done which is negative except mentioned above in history of present illness  MEDICAL HISTORY:  Past Medical History:  Diagnosis Date  . Abdominal wall mass   . Anginal pain ([place]   . Anxiety   . Arthritis   . Calculus of kidney   . Cystitis   . Depression   . Diabetes mellitus without complication (HCC)    elevated A1c  . Dyspnea on exertion   . Elevated serum creatinine   . Fibrocystic breast disease   . GERD (gastroesophageal reflux disease)   . Hearing loss   . Heart murmur   . HTN (hypertension)   . Hyperlipidemia   . MDS (myelodysplastic syndrome), high grade ([place] 04/23/2016  . Microscopic hematuria   . Mucositis due to chemotherapy   .  Obesity   . Risk for falls   . Sleep apnea   . Thrombocytopenia ([place]   . Urinary frequency   . Urinary urgency     SURGICAL HISTORY: Past Surgical History:  Procedure Laterality Date  . ABDOMINAL HYSTERECTOMY    . APPENDECTOMY    . CARDIAC CATHETERIZATION     x2  . CHOLECYSTECTOMY    . COLONOSCOPY N/A 02/24/2015   Procedure: COLONOSCOPY;  Surgeon: RManya Silvas MD;  [facility];  Service: Endoscopy;  Laterality: N/A;  . DIAGNOSTIC LAPAROSCOPY     Removal of benign abdominal tumor  . ESOPHAGOGASTRODUODENOSCOPY N/A 02/24/2015   Procedure: ESOPHAGOGASTRODUODENOSCOPY (EGD);  Surgeon: RManya Silvas MD;  [facility];  Service: Endoscopy;  Laterality: N/A;  . right eye surgery Right     SOCIAL HISTORY: lives with family; [place] No smoking/ no alcohol.  Social History   Social History  . Marital status: Married    Spouse name: N/A  . Number of children: N/A  . Years of education: N/A   Occupational History  . Not on file.   Social History Main Topics  . Smoking status: Former Smoker    Quit date: 09/09/1988  . Smokeless tobacco: Never Used     Comment: quit 25 years ago  . Alcohol use No  . Drug use: No  . Sexual activity: Not on file   Other Topics Concern  .  Not on file   Social History Narrative  . No narrative on file    FAMILY HISTORY: no cancers in family.  Family History  Problem Relation Age of Onset  . Congestive Heart Failure Mother   . Diabetes Mother   . Coronary artery disease Mother   . Stroke Mother   . Cirrhosis Father     ALLERGIES:  is allergic to macrobid [nitrofurantoin monohyd macro].  MEDICATIONS:  Current Outpatient Prescriptions  Medication Sig Dispense Refill  . acyclovir (ZOVIRAX) 400 MG tablet Take 1 tablet (400 mg total) by mouth 2 (two) times daily. 120 tablet 2  . buPROPion (WELLBUTRIN XL) 150 MG 24 hr tablet Take 300 mg by mouth daily at 12 noon.     . clindamycin  (CLEOCIN) 300 MG capsule Take 1 capsule (300 mg total) by mouth 3 (three) times daily. 30 capsule 0  . clonazePAM (KLONOPIN) 0.5 MG tablet Take 0.5 mg by mouth 2 (two) times daily.     . fluconazole (DIFLUCAN) 100 MG tablet Take 1 tablet (100 mg total) by mouth daily. 30 tablet 3  . hydrocortisone (ANUSOL-HC) 2.5 % rectal cream Place 1 application rectally 2 (two) times daily as needed for hemorrhoids or itching. 30 g 0  . LUMIGAN 0.01 % SOLN Place 1 drop into both eyes at bedtime.     . metFORMIN (GLUCOPHAGE) 500 MG tablet TAKE 1 TABLET BY MOUTH DAILY 90 tablet 0  . Multiple Vitamin (MULTIVITAMIN WITH MINERALS) TABS tablet Take 1 tablet by mouth daily.    . Omega-3 Fatty Acids (FISH OIL) 1000 MG CAPS Take 1 capsule by mouth daily.    . polyethylene glycol (MIRALAX / GLYCOLAX) packet Take 17 g by mouth daily as needed for mild constipation. 14 each 1  . rosuvastatin (CRESTOR) 20 MG tablet Take 1 tablet (20 mg total) by mouth daily. 90 tablet 1  . sertraline (ZOLOFT) 100 MG tablet Take 100 mg by mouth daily at 12 noon.     Marland Kitchen Umeclidinium-Vilanterol (ANORO ELLIPTA) 62.5-25 MCG/INH AEPB Inhale 1 puff into the lungs daily as needed (shortness of breath).     . ondansetron (ZOFRAN) 8 MG tablet Take 1 tablet (8 mg total) by mouth 2 (two) times daily as needed (Nausea or vomiting). (Patient not taking: Reported on 06/24/2016) 30 tablet 1  . prochlorperazine (COMPAZINE) 10 MG tablet Take 1 tablet (10 mg total) by mouth every 6 (six) hours as needed (Nausea or vomiting). (Patient not taking: Reported on 06/24/2016) 30 tablet 1   No current facility-administered medications for this visit.       Marland Kitchen  PHYSICAL EXAMINATION: ECOG PERFORMANCE STATUS: 1 - Symptomatic but completely ambulatory  Vitals:   06/24/16 1054  BP: 117/72  Pulse: 66  Resp: 17  Temp: (!) 96.7 F (35.9 C)   Filed Weights   06/24/16 1054  Weight: 236 lb 9.6 oz (107.3 kg)    GENERAL: Well-nourished well-developed; Alert, no  distress and comfortable.   Obese. Accompanied by her sister-in-law/husband. She is walking herself.Today she is in a wheelchair.  EYES: Positive for pallor. OROPHARYNX: no thrush or ulceration;  NECK: supple, no masses felt LYMPH:  no palpable lymphadenopathy in the cervical, axillary or inguinal regions LUNGS: clear to auscultation and  No wheeze or crackles HEART/CVS: regular rate & rhythm and no murmurs; No lower extremity edema ABDOMEN: abdomen soft, non-tender and normal bowel sounds Musculoskeletal:no cyanosis of digits and no clubbing  PSYCH: alert & oriented x 3 with fluent speech  NEURO: no focal motor/sensory deficits SKIN:  ablation noted on the right leg /improving no signs of infection.   LABORATORY DATA:  I have reviewed the data as listed Lab Results  Component Value Date   WBC 2.4 (L) 06/24/2016   HGB 8.3 (L) 06/24/2016   HCT 24.6 (L) 06/24/2016   MCV 103.9 (H) 06/24/2016   PLT 54 (L) 06/24/2016    Recent Labs  05/07/16 1652  05/27/16 0859  06/19/16 1048 06/20/16 0355 06/24/16 0936  NA  --   < > 141  < > 141 142 142  K  --   < > 3.7  < > 3.9 4.7 4.5  CL  --   < > 106  < > 106 107 107  CO2  --   < > 26  < > 26 29 27   GLUCOSE  --   < > 143*  < > 119* 119* 127*  BUN  --   < > 17  < > 14 20 28*  CREATININE  --   < > 0.92  < > 0.91 1.02* 1.01*  CALCIUM  --   < > 8.9  < > 9.5 9.1 9.5  GFRNONAA  --   < > 60*  < > >60 53* 53*  GFRAA  --   < > >60  < > >60 >60 >60  PROT 8.3*  --  7.3  --  7.9  --  7.4  ALBUMIN 3.8  --  3.8  --  4.1  --  3.9  AST 26  --  23  --  29  --  21  ALT 25  --  19  --  29  --  21  ALKPHOS 65  --  56  --  85  --  70  BILITOT 0.6  --  0.4  --  0.4  --  0.3  BILIDIR 0.1  --   --   --   --   --   --   IBILI 0.5  --   --   --   --   --   --   < > = values in this interval not displayed.  RADIOGRAPHIC STUDIES: I have personally reviewed the radiological images as listed and agreed with the findings in the report. No results  found.  ASSESSMENT & PLAN:   MDS (myelodysplastic syndrome), high grade (HCC) Refractory anemia- with excess blasts- II [blasts percentage 14%] FISH- normal karyotype. On vidaza; s/p  cycle #2 today. Status post evaluation at [facility].   # Proceed with cycle #3 day #1 today. Tolerating well except for constipation. White count 2.3 ANC 1.2 white count 8.3 platelets 54 [? Sec to clumpaing]. Rechecked on heparin. Discussed with the patient that I would recommend a bone marrow biopsy after 4 cycles; however if her platelets continue to drop/not improve then I would recommend a bone marrow biopsy sooner.   # Mucositis-G-1.  on magic mouth wash/improving.  # prophylactic antibiotics- acyclovir/ diflucan.    # constipation- miralax/ dulcolax- imrpoved.   # follow up with cycle #4 in 4 weeks. Labs - cbc-Tuesday/ Thursday- hold tube/ weekly bmp.      Cammie Sickle, MD 06/24/2016 1:19 PM

## 2016-06-24 NOTE — Assessment & Plan Note (Addendum)
Refractory anemia- with excess blasts- II [blasts percentage 14%] FISH- normal karyotype. On vidaza; s/p  cycle #2 today. Status post evaluation at [facility].   # Proceed with cycle #3 day #1 today. Tolerating well except for constipation. White count 2.3 ANC 1.2 white count 8.3 platelets 54 [? Sec to clumpaing]. Rechecked on heparin. Discussed with the patient that I would recommend a bone marrow biopsy after 4 cycles; however if her platelets continue to drop/not improve then I would recommend a bone marrow biopsy sooner.   # Mucositis-G-1.  on magic mouth wash/improving.  # prophylactic antibiotics- acyclovir/ diflucan.    # constipation- miralax/ dulcolax- imrpoved.   # follow up with cycle #4 in 4 weeks. Labs - cbc-Tuesday/ Thursday- hold tube/ weekly bmp.

## 2016-06-24 NOTE — Progress Notes (Signed)
Placed on antibiotc for her shin in the ER. No major concerns at this time

## 2016-06-25 ENCOUNTER — Inpatient Hospital Stay: Payer: Medicare Other

## 2016-06-25 VITALS — BP 106/62 | HR 71 | Temp 96.1°F | Resp 18

## 2016-06-25 DIAGNOSIS — Z7984 Long term (current) use of oral hypoglycemic drugs: Secondary | ICD-10-CM | POA: Diagnosis not present

## 2016-06-25 DIAGNOSIS — G4730 Sleep apnea, unspecified: Secondary | ICD-10-CM | POA: Diagnosis not present

## 2016-06-25 DIAGNOSIS — D46Z Other myelodysplastic syndromes: Secondary | ICD-10-CM

## 2016-06-25 DIAGNOSIS — K219 Gastro-esophageal reflux disease without esophagitis: Secondary | ICD-10-CM | POA: Diagnosis not present

## 2016-06-25 DIAGNOSIS — T451X5S Adverse effect of antineoplastic and immunosuppressive drugs, sequela: Secondary | ICD-10-CM | POA: Diagnosis not present

## 2016-06-25 DIAGNOSIS — E119 Type 2 diabetes mellitus without complications: Secondary | ICD-10-CM | POA: Diagnosis not present

## 2016-06-25 DIAGNOSIS — Z5111 Encounter for antineoplastic chemotherapy: Secondary | ICD-10-CM | POA: Diagnosis not present

## 2016-06-25 DIAGNOSIS — Z87891 Personal history of nicotine dependence: Secondary | ICD-10-CM | POA: Diagnosis not present

## 2016-06-25 DIAGNOSIS — Z9181 History of falling: Secondary | ICD-10-CM | POA: Diagnosis not present

## 2016-06-25 DIAGNOSIS — I10 Essential (primary) hypertension: Secondary | ICD-10-CM | POA: Diagnosis not present

## 2016-06-25 DIAGNOSIS — Z79899 Other long term (current) drug therapy: Secondary | ICD-10-CM | POA: Diagnosis not present

## 2016-06-25 DIAGNOSIS — D4622 Refractory anemia with excess of blasts 2: Secondary | ICD-10-CM | POA: Diagnosis not present

## 2016-06-25 DIAGNOSIS — K1231 Oral mucositis (ulcerative) due to antineoplastic therapy: Secondary | ICD-10-CM | POA: Diagnosis not present

## 2016-06-25 DIAGNOSIS — K5900 Constipation, unspecified: Secondary | ICD-10-CM | POA: Diagnosis not present

## 2016-06-25 DIAGNOSIS — M1990 Unspecified osteoarthritis, unspecified site: Secondary | ICD-10-CM | POA: Diagnosis not present

## 2016-06-25 DIAGNOSIS — Z87442 Personal history of urinary calculi: Secondary | ICD-10-CM | POA: Diagnosis not present

## 2016-06-25 DIAGNOSIS — Z792 Long term (current) use of antibiotics: Secondary | ICD-10-CM | POA: Diagnosis not present

## 2016-06-25 DIAGNOSIS — Z87828 Personal history of other (healed) physical injury and trauma: Secondary | ICD-10-CM | POA: Diagnosis not present

## 2016-06-25 DIAGNOSIS — E785 Hyperlipidemia, unspecified: Secondary | ICD-10-CM | POA: Diagnosis not present

## 2016-06-25 MED ORDER — AZACITIDINE CHEMO SQ INJECTION
75.0000 mg/m2 | Freq: Once | INTRAMUSCULAR | Status: AC
  Administered 2016-06-25: 165 mg via SUBCUTANEOUS
  Filled 2016-06-25: qty 6.6

## 2016-06-25 MED ORDER — ONDANSETRON HCL 4 MG PO TABS
8.0000 mg | ORAL_TABLET | Freq: Once | ORAL | Status: AC
  Administered 2016-06-25: 8 mg via ORAL
  Filled 2016-06-25: qty 2

## 2016-06-26 ENCOUNTER — Other Ambulatory Visit: Payer: Self-pay

## 2016-06-26 ENCOUNTER — Inpatient Hospital Stay: Payer: Medicare Other

## 2016-06-26 VITALS — BP 120/70 | HR 73 | Temp 96.3°F | Resp 18

## 2016-06-26 DIAGNOSIS — Z87828 Personal history of other (healed) physical injury and trauma: Secondary | ICD-10-CM | POA: Diagnosis not present

## 2016-06-26 DIAGNOSIS — Z79899 Other long term (current) drug therapy: Secondary | ICD-10-CM | POA: Diagnosis not present

## 2016-06-26 DIAGNOSIS — Z5111 Encounter for antineoplastic chemotherapy: Secondary | ICD-10-CM | POA: Diagnosis not present

## 2016-06-26 DIAGNOSIS — G4730 Sleep apnea, unspecified: Secondary | ICD-10-CM | POA: Diagnosis not present

## 2016-06-26 DIAGNOSIS — D46Z Other myelodysplastic syndromes: Secondary | ICD-10-CM

## 2016-06-26 DIAGNOSIS — Z87442 Personal history of urinary calculi: Secondary | ICD-10-CM | POA: Diagnosis not present

## 2016-06-26 DIAGNOSIS — Z7984 Long term (current) use of oral hypoglycemic drugs: Secondary | ICD-10-CM | POA: Diagnosis not present

## 2016-06-26 DIAGNOSIS — Z87891 Personal history of nicotine dependence: Secondary | ICD-10-CM | POA: Diagnosis not present

## 2016-06-26 DIAGNOSIS — E785 Hyperlipidemia, unspecified: Secondary | ICD-10-CM | POA: Diagnosis not present

## 2016-06-26 DIAGNOSIS — E119 Type 2 diabetes mellitus without complications: Secondary | ICD-10-CM | POA: Diagnosis not present

## 2016-06-26 DIAGNOSIS — D4622 Refractory anemia with excess of blasts 2: Secondary | ICD-10-CM | POA: Diagnosis not present

## 2016-06-26 DIAGNOSIS — Z9181 History of falling: Secondary | ICD-10-CM | POA: Diagnosis not present

## 2016-06-26 DIAGNOSIS — K1231 Oral mucositis (ulcerative) due to antineoplastic therapy: Secondary | ICD-10-CM | POA: Diagnosis not present

## 2016-06-26 DIAGNOSIS — Z792 Long term (current) use of antibiotics: Secondary | ICD-10-CM | POA: Diagnosis not present

## 2016-06-26 DIAGNOSIS — M1990 Unspecified osteoarthritis, unspecified site: Secondary | ICD-10-CM | POA: Diagnosis not present

## 2016-06-26 DIAGNOSIS — K219 Gastro-esophageal reflux disease without esophagitis: Secondary | ICD-10-CM | POA: Diagnosis not present

## 2016-06-26 DIAGNOSIS — K5900 Constipation, unspecified: Secondary | ICD-10-CM | POA: Diagnosis not present

## 2016-06-26 DIAGNOSIS — T451X5S Adverse effect of antineoplastic and immunosuppressive drugs, sequela: Secondary | ICD-10-CM | POA: Diagnosis not present

## 2016-06-26 DIAGNOSIS — I10 Essential (primary) hypertension: Secondary | ICD-10-CM | POA: Diagnosis not present

## 2016-06-26 MED ORDER — ONDANSETRON HCL 4 MG PO TABS
8.0000 mg | ORAL_TABLET | Freq: Once | ORAL | Status: AC
  Administered 2016-06-26: 8 mg via ORAL
  Filled 2016-06-26: qty 2

## 2016-06-26 MED ORDER — AZACITIDINE CHEMO SQ INJECTION
75.0000 mg/m2 | Freq: Once | INTRAMUSCULAR | Status: AC
  Administered 2016-06-26: 165 mg via SUBCUTANEOUS
  Filled 2016-06-26: qty 6.6

## 2016-06-27 ENCOUNTER — Inpatient Hospital Stay: Payer: Medicare Other

## 2016-06-27 ENCOUNTER — Ambulatory Visit: Payer: Medicare Other | Admitting: Family Medicine

## 2016-06-27 VITALS — BP 106/65 | HR 77 | Temp 97.2°F | Resp 18

## 2016-06-27 DIAGNOSIS — K5900 Constipation, unspecified: Secondary | ICD-10-CM | POA: Diagnosis not present

## 2016-06-27 DIAGNOSIS — E785 Hyperlipidemia, unspecified: Secondary | ICD-10-CM | POA: Diagnosis not present

## 2016-06-27 DIAGNOSIS — Z87891 Personal history of nicotine dependence: Secondary | ICD-10-CM | POA: Diagnosis not present

## 2016-06-27 DIAGNOSIS — E119 Type 2 diabetes mellitus without complications: Secondary | ICD-10-CM | POA: Diagnosis not present

## 2016-06-27 DIAGNOSIS — Z87442 Personal history of urinary calculi: Secondary | ICD-10-CM | POA: Diagnosis not present

## 2016-06-27 DIAGNOSIS — Z87828 Personal history of other (healed) physical injury and trauma: Secondary | ICD-10-CM | POA: Diagnosis not present

## 2016-06-27 DIAGNOSIS — K1231 Oral mucositis (ulcerative) due to antineoplastic therapy: Secondary | ICD-10-CM | POA: Diagnosis not present

## 2016-06-27 DIAGNOSIS — D46Z Other myelodysplastic syndromes: Secondary | ICD-10-CM

## 2016-06-27 DIAGNOSIS — D4622 Refractory anemia with excess of blasts 2: Secondary | ICD-10-CM | POA: Diagnosis not present

## 2016-06-27 DIAGNOSIS — T451X5S Adverse effect of antineoplastic and immunosuppressive drugs, sequela: Secondary | ICD-10-CM | POA: Diagnosis not present

## 2016-06-27 DIAGNOSIS — Z792 Long term (current) use of antibiotics: Secondary | ICD-10-CM | POA: Diagnosis not present

## 2016-06-27 DIAGNOSIS — Z79899 Other long term (current) drug therapy: Secondary | ICD-10-CM | POA: Diagnosis not present

## 2016-06-27 DIAGNOSIS — K219 Gastro-esophageal reflux disease without esophagitis: Secondary | ICD-10-CM | POA: Diagnosis not present

## 2016-06-27 DIAGNOSIS — G4730 Sleep apnea, unspecified: Secondary | ICD-10-CM | POA: Diagnosis not present

## 2016-06-27 DIAGNOSIS — I10 Essential (primary) hypertension: Secondary | ICD-10-CM | POA: Diagnosis not present

## 2016-06-27 DIAGNOSIS — M1990 Unspecified osteoarthritis, unspecified site: Secondary | ICD-10-CM | POA: Diagnosis not present

## 2016-06-27 DIAGNOSIS — Z7984 Long term (current) use of oral hypoglycemic drugs: Secondary | ICD-10-CM | POA: Diagnosis not present

## 2016-06-27 DIAGNOSIS — Z5111 Encounter for antineoplastic chemotherapy: Secondary | ICD-10-CM | POA: Diagnosis not present

## 2016-06-27 DIAGNOSIS — Z9181 History of falling: Secondary | ICD-10-CM | POA: Diagnosis not present

## 2016-06-27 MED ORDER — AZACITIDINE CHEMO SQ INJECTION
75.0000 mg/m2 | Freq: Once | INTRAMUSCULAR | Status: AC
  Administered 2016-06-27: 165 mg via SUBCUTANEOUS
  Filled 2016-06-27: qty 6.6

## 2016-06-27 MED ORDER — ONDANSETRON HCL 4 MG PO TABS
8.0000 mg | ORAL_TABLET | Freq: Once | ORAL | Status: AC
  Administered 2016-06-27: 8 mg via ORAL
  Filled 2016-06-27: qty 2

## 2016-06-28 ENCOUNTER — Inpatient Hospital Stay: Payer: Medicare Other

## 2016-06-28 DIAGNOSIS — D46Z Other myelodysplastic syndromes: Secondary | ICD-10-CM

## 2016-06-28 DIAGNOSIS — Z79899 Other long term (current) drug therapy: Secondary | ICD-10-CM | POA: Diagnosis not present

## 2016-06-28 DIAGNOSIS — I10 Essential (primary) hypertension: Secondary | ICD-10-CM | POA: Diagnosis not present

## 2016-06-28 DIAGNOSIS — Z792 Long term (current) use of antibiotics: Secondary | ICD-10-CM | POA: Diagnosis not present

## 2016-06-28 DIAGNOSIS — E785 Hyperlipidemia, unspecified: Secondary | ICD-10-CM | POA: Diagnosis not present

## 2016-06-28 DIAGNOSIS — G4730 Sleep apnea, unspecified: Secondary | ICD-10-CM | POA: Diagnosis not present

## 2016-06-28 DIAGNOSIS — K5900 Constipation, unspecified: Secondary | ICD-10-CM | POA: Diagnosis not present

## 2016-06-28 DIAGNOSIS — T451X5S Adverse effect of antineoplastic and immunosuppressive drugs, sequela: Secondary | ICD-10-CM | POA: Diagnosis not present

## 2016-06-28 DIAGNOSIS — Z9181 History of falling: Secondary | ICD-10-CM | POA: Diagnosis not present

## 2016-06-28 DIAGNOSIS — Z87891 Personal history of nicotine dependence: Secondary | ICD-10-CM | POA: Diagnosis not present

## 2016-06-28 DIAGNOSIS — D4622 Refractory anemia with excess of blasts 2: Secondary | ICD-10-CM | POA: Diagnosis not present

## 2016-06-28 DIAGNOSIS — Z87828 Personal history of other (healed) physical injury and trauma: Secondary | ICD-10-CM | POA: Diagnosis not present

## 2016-06-28 DIAGNOSIS — Z7984 Long term (current) use of oral hypoglycemic drugs: Secondary | ICD-10-CM | POA: Diagnosis not present

## 2016-06-28 DIAGNOSIS — E119 Type 2 diabetes mellitus without complications: Secondary | ICD-10-CM | POA: Diagnosis not present

## 2016-06-28 DIAGNOSIS — K219 Gastro-esophageal reflux disease without esophagitis: Secondary | ICD-10-CM | POA: Diagnosis not present

## 2016-06-28 DIAGNOSIS — Z5111 Encounter for antineoplastic chemotherapy: Secondary | ICD-10-CM | POA: Diagnosis not present

## 2016-06-28 DIAGNOSIS — Z87442 Personal history of urinary calculi: Secondary | ICD-10-CM | POA: Diagnosis not present

## 2016-06-28 DIAGNOSIS — M1990 Unspecified osteoarthritis, unspecified site: Secondary | ICD-10-CM | POA: Diagnosis not present

## 2016-06-28 DIAGNOSIS — K1231 Oral mucositis (ulcerative) due to antineoplastic therapy: Secondary | ICD-10-CM | POA: Diagnosis not present

## 2016-06-28 MED ORDER — AZACITIDINE CHEMO SQ INJECTION
75.0000 mg/m2 | Freq: Once | INTRAMUSCULAR | Status: AC
  Administered 2016-06-28: 165 mg via SUBCUTANEOUS
  Filled 2016-06-28: qty 6.6

## 2016-06-28 MED ORDER — ONDANSETRON HCL 4 MG PO TABS
8.0000 mg | ORAL_TABLET | Freq: Once | ORAL | Status: AC
  Administered 2016-06-28: 8 mg via ORAL
  Filled 2016-06-28: qty 2

## 2016-07-01 ENCOUNTER — Inpatient Hospital Stay: Payer: Medicare Other

## 2016-07-01 VITALS — BP 128/74 | HR 69 | Temp 97.3°F | Resp 20

## 2016-07-01 DIAGNOSIS — I10 Essential (primary) hypertension: Secondary | ICD-10-CM | POA: Diagnosis not present

## 2016-07-01 DIAGNOSIS — G4730 Sleep apnea, unspecified: Secondary | ICD-10-CM | POA: Diagnosis not present

## 2016-07-01 DIAGNOSIS — Z5111 Encounter for antineoplastic chemotherapy: Secondary | ICD-10-CM | POA: Diagnosis not present

## 2016-07-01 DIAGNOSIS — E785 Hyperlipidemia, unspecified: Secondary | ICD-10-CM | POA: Diagnosis not present

## 2016-07-01 DIAGNOSIS — D46Z Other myelodysplastic syndromes: Secondary | ICD-10-CM

## 2016-07-01 DIAGNOSIS — K1231 Oral mucositis (ulcerative) due to antineoplastic therapy: Secondary | ICD-10-CM | POA: Diagnosis not present

## 2016-07-01 DIAGNOSIS — Z87891 Personal history of nicotine dependence: Secondary | ICD-10-CM | POA: Diagnosis not present

## 2016-07-01 DIAGNOSIS — Z87442 Personal history of urinary calculi: Secondary | ICD-10-CM | POA: Diagnosis not present

## 2016-07-01 DIAGNOSIS — Z79899 Other long term (current) drug therapy: Secondary | ICD-10-CM | POA: Diagnosis not present

## 2016-07-01 DIAGNOSIS — Z87828 Personal history of other (healed) physical injury and trauma: Secondary | ICD-10-CM | POA: Diagnosis not present

## 2016-07-01 DIAGNOSIS — D4622 Refractory anemia with excess of blasts 2: Secondary | ICD-10-CM | POA: Diagnosis not present

## 2016-07-01 DIAGNOSIS — Z7984 Long term (current) use of oral hypoglycemic drugs: Secondary | ICD-10-CM | POA: Diagnosis not present

## 2016-07-01 DIAGNOSIS — K219 Gastro-esophageal reflux disease without esophagitis: Secondary | ICD-10-CM | POA: Diagnosis not present

## 2016-07-01 DIAGNOSIS — T451X5S Adverse effect of antineoplastic and immunosuppressive drugs, sequela: Secondary | ICD-10-CM | POA: Diagnosis not present

## 2016-07-01 DIAGNOSIS — K5900 Constipation, unspecified: Secondary | ICD-10-CM | POA: Diagnosis not present

## 2016-07-01 DIAGNOSIS — Z792 Long term (current) use of antibiotics: Secondary | ICD-10-CM | POA: Diagnosis not present

## 2016-07-01 DIAGNOSIS — M1990 Unspecified osteoarthritis, unspecified site: Secondary | ICD-10-CM | POA: Diagnosis not present

## 2016-07-01 DIAGNOSIS — E119 Type 2 diabetes mellitus without complications: Secondary | ICD-10-CM | POA: Diagnosis not present

## 2016-07-01 DIAGNOSIS — Z9181 History of falling: Secondary | ICD-10-CM | POA: Diagnosis not present

## 2016-07-01 LAB — CBC WITH DIFFERENTIAL/PLATELET
BASOS PCT: 1 %
Basophils Absolute: 0 10*3/uL (ref 0–0.1)
EOS ABS: 0 10*3/uL (ref 0–0.7)
EOS PCT: 0 %
HCT: 25.3 % — ABNORMAL LOW (ref 35.0–47.0)
Hemoglobin: 8.4 g/dL — ABNORMAL LOW (ref 12.0–16.0)
LYMPHS ABS: 1 10*3/uL (ref 1.0–3.6)
Lymphocytes Relative: 44 %
MCH: 34.4 pg — AB (ref 26.0–34.0)
MCHC: 33.4 g/dL (ref 32.0–36.0)
MCV: 103.1 fL — ABNORMAL HIGH (ref 80.0–100.0)
MONO ABS: 0.2 10*3/uL (ref 0.2–0.9)
MONOS PCT: 9 %
NEUTROS PCT: 46 %
Neutro Abs: 1 10*3/uL — ABNORMAL LOW (ref 1.4–6.5)
PLATELETS: 124 10*3/uL — AB (ref 150–440)
RBC: 2.45 MIL/uL — ABNORMAL LOW (ref 3.80–5.20)
RDW: 19.5 % — AB (ref 11.5–14.5)
WBC: 2.1 10*3/uL — ABNORMAL LOW (ref 3.6–11.0)

## 2016-07-01 LAB — BASIC METABOLIC PANEL
Anion gap: 9 (ref 5–15)
BUN: 21 mg/dL — AB (ref 6–20)
CALCIUM: 9.2 mg/dL (ref 8.9–10.3)
CHLORIDE: 101 mmol/L (ref 101–111)
CO2: 28 mmol/L (ref 22–32)
CREATININE: 1.12 mg/dL — AB (ref 0.44–1.00)
GFR calc non Af Amer: 47 mL/min — ABNORMAL LOW (ref >60)
GFR, EST AFRICAN AMERICAN: 55 mL/min — AB (ref >60)
Glucose, Bld: 133 mg/dL — ABNORMAL HIGH (ref 65–99)
Potassium: 4.7 mmol/L (ref 3.5–5.1)
SODIUM: 138 mmol/L (ref 135–145)

## 2016-07-01 LAB — SAMPLE TO BLOOD BANK

## 2016-07-01 MED ORDER — ONDANSETRON HCL 4 MG PO TABS
8.0000 mg | ORAL_TABLET | Freq: Once | ORAL | Status: AC
  Administered 2016-07-01: 8 mg via ORAL
  Filled 2016-07-01: qty 2

## 2016-07-01 MED ORDER — AZACITIDINE CHEMO SQ INJECTION
75.0000 mg/m2 | Freq: Once | INTRAMUSCULAR | Status: AC
  Administered 2016-07-01: 165 mg via SUBCUTANEOUS
  Filled 2016-07-01: qty 6.6

## 2016-07-02 ENCOUNTER — Other Ambulatory Visit: Payer: Self-pay

## 2016-07-02 ENCOUNTER — Inpatient Hospital Stay: Payer: Medicare Other

## 2016-07-02 VITALS — BP 117/74 | HR 72 | Temp 97.4°F | Resp 18

## 2016-07-02 DIAGNOSIS — Z5111 Encounter for antineoplastic chemotherapy: Secondary | ICD-10-CM | POA: Diagnosis not present

## 2016-07-02 DIAGNOSIS — Z87891 Personal history of nicotine dependence: Secondary | ICD-10-CM | POA: Diagnosis not present

## 2016-07-02 DIAGNOSIS — Z79899 Other long term (current) drug therapy: Secondary | ICD-10-CM | POA: Diagnosis not present

## 2016-07-02 DIAGNOSIS — T451X5S Adverse effect of antineoplastic and immunosuppressive drugs, sequela: Secondary | ICD-10-CM | POA: Diagnosis not present

## 2016-07-02 DIAGNOSIS — Z9181 History of falling: Secondary | ICD-10-CM | POA: Diagnosis not present

## 2016-07-02 DIAGNOSIS — Z792 Long term (current) use of antibiotics: Secondary | ICD-10-CM | POA: Diagnosis not present

## 2016-07-02 DIAGNOSIS — Z87442 Personal history of urinary calculi: Secondary | ICD-10-CM | POA: Diagnosis not present

## 2016-07-02 DIAGNOSIS — E785 Hyperlipidemia, unspecified: Secondary | ICD-10-CM | POA: Diagnosis not present

## 2016-07-02 DIAGNOSIS — K5900 Constipation, unspecified: Secondary | ICD-10-CM | POA: Diagnosis not present

## 2016-07-02 DIAGNOSIS — E119 Type 2 diabetes mellitus without complications: Secondary | ICD-10-CM | POA: Diagnosis not present

## 2016-07-02 DIAGNOSIS — D4622 Refractory anemia with excess of blasts 2: Secondary | ICD-10-CM | POA: Diagnosis not present

## 2016-07-02 DIAGNOSIS — K219 Gastro-esophageal reflux disease without esophagitis: Secondary | ICD-10-CM | POA: Diagnosis not present

## 2016-07-02 DIAGNOSIS — M1990 Unspecified osteoarthritis, unspecified site: Secondary | ICD-10-CM | POA: Diagnosis not present

## 2016-07-02 DIAGNOSIS — G4730 Sleep apnea, unspecified: Secondary | ICD-10-CM | POA: Diagnosis not present

## 2016-07-02 DIAGNOSIS — Z7984 Long term (current) use of oral hypoglycemic drugs: Secondary | ICD-10-CM | POA: Diagnosis not present

## 2016-07-02 DIAGNOSIS — Z87828 Personal history of other (healed) physical injury and trauma: Secondary | ICD-10-CM | POA: Diagnosis not present

## 2016-07-02 DIAGNOSIS — I10 Essential (primary) hypertension: Secondary | ICD-10-CM | POA: Diagnosis not present

## 2016-07-02 DIAGNOSIS — D46Z Other myelodysplastic syndromes: Secondary | ICD-10-CM

## 2016-07-02 DIAGNOSIS — K1231 Oral mucositis (ulcerative) due to antineoplastic therapy: Secondary | ICD-10-CM | POA: Diagnosis not present

## 2016-07-02 MED ORDER — AZACITIDINE CHEMO SQ INJECTION
75.0000 mg/m2 | Freq: Once | INTRAMUSCULAR | Status: AC
  Administered 2016-07-02: 165 mg via SUBCUTANEOUS
  Filled 2016-07-02: qty 6.6

## 2016-07-02 MED ORDER — ONDANSETRON HCL 4 MG PO TABS
8.0000 mg | ORAL_TABLET | Freq: Once | ORAL | Status: AC
  Administered 2016-07-02: 8 mg via ORAL
  Filled 2016-07-02: qty 2

## 2016-07-04 ENCOUNTER — Inpatient Hospital Stay: Payer: Medicare Other

## 2016-07-04 ENCOUNTER — Other Ambulatory Visit: Payer: Self-pay

## 2016-07-04 DIAGNOSIS — E785 Hyperlipidemia, unspecified: Secondary | ICD-10-CM | POA: Diagnosis not present

## 2016-07-04 DIAGNOSIS — I10 Essential (primary) hypertension: Secondary | ICD-10-CM | POA: Diagnosis not present

## 2016-07-04 DIAGNOSIS — K5900 Constipation, unspecified: Secondary | ICD-10-CM | POA: Diagnosis not present

## 2016-07-04 DIAGNOSIS — K1231 Oral mucositis (ulcerative) due to antineoplastic therapy: Secondary | ICD-10-CM | POA: Diagnosis not present

## 2016-07-04 DIAGNOSIS — Z5111 Encounter for antineoplastic chemotherapy: Secondary | ICD-10-CM | POA: Diagnosis not present

## 2016-07-04 DIAGNOSIS — Z87828 Personal history of other (healed) physical injury and trauma: Secondary | ICD-10-CM | POA: Diagnosis not present

## 2016-07-04 DIAGNOSIS — Z9181 History of falling: Secondary | ICD-10-CM | POA: Diagnosis not present

## 2016-07-04 DIAGNOSIS — K219 Gastro-esophageal reflux disease without esophagitis: Secondary | ICD-10-CM | POA: Diagnosis not present

## 2016-07-04 DIAGNOSIS — G4730 Sleep apnea, unspecified: Secondary | ICD-10-CM | POA: Diagnosis not present

## 2016-07-04 DIAGNOSIS — E119 Type 2 diabetes mellitus without complications: Secondary | ICD-10-CM | POA: Diagnosis not present

## 2016-07-04 DIAGNOSIS — M1990 Unspecified osteoarthritis, unspecified site: Secondary | ICD-10-CM | POA: Diagnosis not present

## 2016-07-04 DIAGNOSIS — Z7984 Long term (current) use of oral hypoglycemic drugs: Secondary | ICD-10-CM | POA: Diagnosis not present

## 2016-07-04 DIAGNOSIS — Z79899 Other long term (current) drug therapy: Secondary | ICD-10-CM | POA: Diagnosis not present

## 2016-07-04 DIAGNOSIS — Z87891 Personal history of nicotine dependence: Secondary | ICD-10-CM | POA: Diagnosis not present

## 2016-07-04 DIAGNOSIS — Z87442 Personal history of urinary calculi: Secondary | ICD-10-CM | POA: Diagnosis not present

## 2016-07-04 DIAGNOSIS — D46Z Other myelodysplastic syndromes: Secondary | ICD-10-CM

## 2016-07-04 DIAGNOSIS — D4622 Refractory anemia with excess of blasts 2: Secondary | ICD-10-CM | POA: Diagnosis not present

## 2016-07-04 DIAGNOSIS — Z792 Long term (current) use of antibiotics: Secondary | ICD-10-CM | POA: Diagnosis not present

## 2016-07-04 DIAGNOSIS — T451X5S Adverse effect of antineoplastic and immunosuppressive drugs, sequela: Secondary | ICD-10-CM | POA: Diagnosis not present

## 2016-07-04 LAB — CBC WITH DIFFERENTIAL/PLATELET
BASOS ABS: 0 10*3/uL (ref 0–0.1)
BASOS PCT: 1 %
EOS PCT: 0 %
Eosinophils Absolute: 0 10*3/uL (ref 0–0.7)
HCT: 24.6 % — ABNORMAL LOW (ref 35.0–47.0)
Hemoglobin: 8.3 g/dL — ABNORMAL LOW (ref 12.0–16.0)
LYMPHS PCT: 46 %
Lymphs Abs: 1 10*3/uL (ref 1.0–3.6)
MCH: 34.9 pg — ABNORMAL HIGH (ref 26.0–34.0)
MCHC: 33.7 g/dL (ref 32.0–36.0)
MCV: 103.8 fL — AB (ref 80.0–100.0)
Monocytes Absolute: 0.2 10*3/uL (ref 0.2–0.9)
Monocytes Relative: 8 %
NEUTROS ABS: 1 10*3/uL — AB (ref 1.4–6.5)
Neutrophils Relative %: 45 %
PLATELETS: 119 10*3/uL — AB (ref 150–440)
RBC: 2.38 MIL/uL — AB (ref 3.80–5.20)
RDW: 20.3 % — AB (ref 11.5–14.5)
WBC: 2.2 10*3/uL — AB (ref 3.6–11.0)

## 2016-07-04 LAB — SAMPLE TO BLOOD BANK

## 2016-07-05 ENCOUNTER — Ambulatory Visit: Payer: Medicare Other | Admitting: Family Medicine

## 2016-07-08 ENCOUNTER — Encounter: Payer: Self-pay | Admitting: Family Medicine

## 2016-07-08 ENCOUNTER — Ambulatory Visit (INDEPENDENT_AMBULATORY_CARE_PROVIDER_SITE_OTHER): Payer: Medicare Other | Admitting: Family Medicine

## 2016-07-08 VITALS — BP 140/76 | HR 99 | Temp 98.4°F | Resp 16 | Ht 64.0 in | Wt 233.4 lb

## 2016-07-08 DIAGNOSIS — D46Z Other myelodysplastic syndromes: Secondary | ICD-10-CM | POA: Diagnosis not present

## 2016-07-08 DIAGNOSIS — R58 Hemorrhage, not elsewhere classified: Secondary | ICD-10-CM

## 2016-07-08 NOTE — Progress Notes (Signed)
Name: Teresa Carrillo   MRN: [id number]    DOB: [birth year]   Date:07/08/2016       Progress Note  Subjective  Chief Complaint  Chief Complaint  Patient presents with  . Hospitalization Follow-up    Gums bleeding , platelets low    HPI  Pt. Presents for hospital follow up. She was recently admitted to [facility] for neutropenia, platelet count was in the 50s along with spontaneous mucosal bleeding. She stayed in the hospital for one night and had platelet transfusion. She was discharged and will follow up.    Past Medical History:  Diagnosis Date  . Abdominal wall mass   . Anginal pain ([place])   . Anxiety   . Arthritis   . Calculus of kidney   . Cystitis   . Depression   . Diabetes mellitus without complication (HCC)    elevated A1c  . Dyspnea on exertion   . Elevated serum creatinine   . Fibrocystic breast disease   . GERD (gastroesophageal reflux disease)   . Hearing loss   . Heart murmur   . HTN (hypertension)   . Hyperlipidemia   . MDS (myelodysplastic syndrome), high grade ([place]) 04/23/2016  . Microscopic hematuria   . Mucositis due to chemotherapy   . Obesity   . Risk for falls   . Sleep apnea   . Thrombocytopenia ([place])   . Urinary frequency   . Urinary urgency     Past Surgical History:  Procedure Laterality Date  . ABDOMINAL HYSTERECTOMY    . APPENDECTOMY    . CARDIAC CATHETERIZATION     x2  . CHOLECYSTECTOMY    . COLONOSCOPY N/A 02/24/2015   Procedure: COLONOSCOPY;  Surgeon: Manya Silvas, MD;  Location: [facility] ENDOSCOPY;  Service: Endoscopy;  Laterality: N/A;  . DIAGNOSTIC LAPAROSCOPY     Removal of benign abdominal tumor  . ESOPHAGOGASTRODUODENOSCOPY N/A 02/24/2015   Procedure: ESOPHAGOGASTRODUODENOSCOPY (EGD);  Surgeon: Manya Silvas, MD;  Location: [facility] ENDOSCOPY;  Service: Endoscopy;  Laterality: N/A;  . right eye surgery Right     Family History  Problem Relation Age of Onset  . Congestive Heart Failure Mother   . Diabetes Mother   . Coronary  artery disease Mother   . Stroke Mother   . Cirrhosis Father     Social History   Social History  . Marital status: Married    Spouse name: N/A  . Number of children: N/A  . Years of education: N/A   Occupational History  . Not on file.   Social History Main Topics  . Smoking status: Former Smoker    Quit date: 09/09/1988  . Smokeless tobacco: Never Used     Comment: quit 25 years ago  . Alcohol use No  . Drug use: No  . Sexual activity: Not on file   Other Topics Concern  . Not on file   Social History Narrative  . No narrative on file     Current Outpatient Prescriptions:  .  acyclovir (ZOVIRAX) 400 MG tablet, Take 1 tablet (400 mg total) by mouth 2 (two) times daily., Disp: 120 tablet, Rfl: 2 .  buPROPion (WELLBUTRIN XL) 150 MG 24 hr tablet, Take 300 mg by mouth daily at 12 noon. , Disp: , Rfl:  .  clindamycin (CLEOCIN) 300 MG capsule, Take 1 capsule (300 mg total) by mouth 3 (three) times daily., Disp: 30 capsule, Rfl: 0 .  clonazePAM (KLONOPIN) 0.5 MG tablet, Take 0.5 mg by mouth 2 (two)  times daily. , Disp: , Rfl:  .  fluconazole (DIFLUCAN) 100 MG tablet, Take 1 tablet (100 mg total) by mouth daily., Disp: 30 tablet, Rfl: 3 .  hydrocortisone (ANUSOL-HC) 2.5 % rectal cream, Place 1 application rectally 2 (two) times daily as needed for hemorrhoids or itching., Disp: 30 g, Rfl: 0 .  LUMIGAN 0.01 % SOLN, Place 1 drop into both eyes at bedtime. , Disp: , Rfl:  .  metFORMIN (GLUCOPHAGE) 500 MG tablet, TAKE 1 TABLET BY MOUTH DAILY, Disp: 90 tablet, Rfl: 0 .  Multiple Vitamin (MULTIVITAMIN WITH MINERALS) TABS tablet, Take 1 tablet by mouth daily., Disp: , Rfl:  .  Omega-3 Fatty Acids (FISH OIL) 1000 MG CAPS, Take 1 capsule by mouth daily., Disp: , Rfl:  .  ondansetron (ZOFRAN) 8 MG tablet, Take 1 tablet (8 mg total) by mouth 2 (two) times daily as needed (Nausea or vomiting)., Disp: 30 tablet, Rfl: 1 .  polyethylene glycol (MIRALAX / GLYCOLAX) packet, Take 17 g by mouth  daily as needed for mild constipation., Disp: 14 each, Rfl: 1 .  prochlorperazine (COMPAZINE) 10 MG tablet, Take 1 tablet (10 mg total) by mouth every 6 (six) hours as needed (Nausea or vomiting)., Disp: 30 tablet, Rfl: 1 .  rosuvastatin (CRESTOR) 20 MG tablet, Take 1 tablet (20 mg total) by mouth daily., Disp: 90 tablet, Rfl: 1 .  sertraline (ZOLOFT) 100 MG tablet, Take 100 mg by mouth daily at 12 noon. , Disp: , Rfl:  .  Umeclidinium-Vilanterol (ANORO ELLIPTA) 62.5-25 MCG/INH AEPB, Inhale 1 puff into the lungs daily as needed (shortness of breath). , Disp: , Rfl:   Allergies  Allergen Reactions  . Macrobid [employer] Macro] Other (See Comments)    Reaction: unknown     Review of Systems  Constitutional: Positive for malaise/fatigue (with ChemoRx). Negative for chills and fever.  HENT: Negative for nosebleeds.   Cardiovascular: Negative for chest pain.  Gastrointestinal: Negative for abdominal pain, blood in stool, melena, nausea and vomiting.  Genitourinary: Negative for hematuria.    Objective  Vitals:   07/08/16 1125  BP: 140/76  Pulse: 99  Resp: 16  Temp: 98.4 F (36.9 C)  TempSrc: Oral  SpO2: 95%  Weight: 233 lb 6.4 oz (105.9 kg)  Height: 5\' 4"  (1.626 m)    Physical Exam  Constitutional: She is oriented to person, place, and time and well-developed, well-nourished, and in no distress.  HENT:  Head: Normocephalic and atraumatic.  Cardiovascular: Normal rate, regular rhythm and normal heart sounds.   Pulmonary/Chest: Effort normal and breath sounds normal. She has no wheezes.  Abdominal: Soft. Bowel sounds are normal. There is no tenderness.  Neurological: She is alert and oriented to person, place, and time.  Psychiatric: Mood, memory, affect and judgment normal.  Nursing note and vitals reviewed.      Assessment & Plan  1. Mucosal bleeding Now resolved, received platelet transfusion. No episodes of further bleeding  2. MDS (myelodysplastic  syndrome), high grade ([place]) Being followed by oncology.   Dayami Taitt Asad [facility] 07/08/2016 11:40 AM

## 2016-07-09 ENCOUNTER — Inpatient Hospital Stay: Payer: Medicare Other

## 2016-07-09 DIAGNOSIS — K1231 Oral mucositis (ulcerative) due to antineoplastic therapy: Secondary | ICD-10-CM | POA: Diagnosis not present

## 2016-07-09 DIAGNOSIS — Z79899 Other long term (current) drug therapy: Secondary | ICD-10-CM | POA: Diagnosis not present

## 2016-07-09 DIAGNOSIS — G4730 Sleep apnea, unspecified: Secondary | ICD-10-CM | POA: Diagnosis not present

## 2016-07-09 DIAGNOSIS — D4622 Refractory anemia with excess of blasts 2: Secondary | ICD-10-CM | POA: Diagnosis not present

## 2016-07-09 DIAGNOSIS — Z7984 Long term (current) use of oral hypoglycemic drugs: Secondary | ICD-10-CM | POA: Diagnosis not present

## 2016-07-09 DIAGNOSIS — T451X5S Adverse effect of antineoplastic and immunosuppressive drugs, sequela: Secondary | ICD-10-CM | POA: Diagnosis not present

## 2016-07-09 DIAGNOSIS — K219 Gastro-esophageal reflux disease without esophagitis: Secondary | ICD-10-CM | POA: Diagnosis not present

## 2016-07-09 DIAGNOSIS — D46Z Other myelodysplastic syndromes: Secondary | ICD-10-CM

## 2016-07-09 DIAGNOSIS — E785 Hyperlipidemia, unspecified: Secondary | ICD-10-CM | POA: Diagnosis not present

## 2016-07-09 DIAGNOSIS — Z87828 Personal history of other (healed) physical injury and trauma: Secondary | ICD-10-CM | POA: Diagnosis not present

## 2016-07-09 DIAGNOSIS — M1990 Unspecified osteoarthritis, unspecified site: Secondary | ICD-10-CM | POA: Diagnosis not present

## 2016-07-09 DIAGNOSIS — Z5111 Encounter for antineoplastic chemotherapy: Secondary | ICD-10-CM | POA: Diagnosis not present

## 2016-07-09 DIAGNOSIS — Z9181 History of falling: Secondary | ICD-10-CM | POA: Diagnosis not present

## 2016-07-09 DIAGNOSIS — E119 Type 2 diabetes mellitus without complications: Secondary | ICD-10-CM | POA: Diagnosis not present

## 2016-07-09 DIAGNOSIS — Z87891 Personal history of nicotine dependence: Secondary | ICD-10-CM | POA: Diagnosis not present

## 2016-07-09 DIAGNOSIS — Z792 Long term (current) use of antibiotics: Secondary | ICD-10-CM | POA: Diagnosis not present

## 2016-07-09 DIAGNOSIS — Z87442 Personal history of urinary calculi: Secondary | ICD-10-CM | POA: Diagnosis not present

## 2016-07-09 DIAGNOSIS — K5900 Constipation, unspecified: Secondary | ICD-10-CM | POA: Diagnosis not present

## 2016-07-09 DIAGNOSIS — I10 Essential (primary) hypertension: Secondary | ICD-10-CM | POA: Diagnosis not present

## 2016-07-09 LAB — SAMPLE TO BLOOD BANK

## 2016-07-09 LAB — CBC WITH DIFFERENTIAL/PLATELET
Basophils Absolute: 0 10*3/uL (ref 0–0.1)
Basophils Relative: 0 %
EOS ABS: 0 10*3/uL (ref 0–0.7)
EOS PCT: 1 %
HCT: 25.7 % — ABNORMAL LOW (ref 35.0–47.0)
Hemoglobin: 8.6 g/dL — ABNORMAL LOW (ref 12.0–16.0)
LYMPHS ABS: 1 10*3/uL (ref 1.0–3.6)
LYMPHS PCT: 45 %
MCH: 34.8 pg — AB (ref 26.0–34.0)
MCHC: 33.6 g/dL (ref 32.0–36.0)
MCV: 103.8 fL — AB (ref 80.0–100.0)
MONOS PCT: 6 %
Monocytes Absolute: 0.1 10*3/uL — ABNORMAL LOW (ref 0.2–0.9)
Neutro Abs: 1.1 10*3/uL — ABNORMAL LOW (ref 1.4–6.5)
Neutrophils Relative %: 48 %
PLATELETS: 120 10*3/uL — AB (ref 150–440)
RBC: 2.47 MIL/uL — AB (ref 3.80–5.20)
RDW: 19.7 % — ABNORMAL HIGH (ref 11.5–14.5)
WBC: 2.3 10*3/uL — AB (ref 3.6–11.0)

## 2016-07-09 LAB — BASIC METABOLIC PANEL
Anion gap: 8 (ref 5–15)
BUN: 23 mg/dL — AB (ref 6–20)
CHLORIDE: 107 mmol/L (ref 101–111)
CO2: 26 mmol/L (ref 22–32)
CREATININE: 1.08 mg/dL — AB (ref 0.44–1.00)
Calcium: 9.2 mg/dL (ref 8.9–10.3)
GFR calc Af Amer: 57 mL/min — ABNORMAL LOW (ref >60)
GFR, EST NON AFRICAN AMERICAN: 49 mL/min — AB (ref >60)
GLUCOSE: 137 mg/dL — AB (ref 65–99)
POTASSIUM: 4.4 mmol/L (ref 3.5–5.1)
Sodium: 141 mmol/L (ref 135–145)

## 2016-07-11 ENCOUNTER — Inpatient Hospital Stay: Payer: Medicare Other | Attending: Internal Medicine

## 2016-07-11 DIAGNOSIS — G4730 Sleep apnea, unspecified: Secondary | ICD-10-CM | POA: Diagnosis not present

## 2016-07-11 DIAGNOSIS — K219 Gastro-esophageal reflux disease without esophagitis: Secondary | ICD-10-CM | POA: Insufficient documentation

## 2016-07-11 DIAGNOSIS — I10 Essential (primary) hypertension: Secondary | ICD-10-CM | POA: Diagnosis not present

## 2016-07-11 DIAGNOSIS — E785 Hyperlipidemia, unspecified: Secondary | ICD-10-CM | POA: Diagnosis not present

## 2016-07-11 DIAGNOSIS — E119 Type 2 diabetes mellitus without complications: Secondary | ICD-10-CM | POA: Insufficient documentation

## 2016-07-11 DIAGNOSIS — Z5111 Encounter for antineoplastic chemotherapy: Secondary | ICD-10-CM | POA: Insufficient documentation

## 2016-07-11 DIAGNOSIS — E669 Obesity, unspecified: Secondary | ICD-10-CM | POA: Insufficient documentation

## 2016-07-11 DIAGNOSIS — Z7984 Long term (current) use of oral hypoglycemic drugs: Secondary | ICD-10-CM | POA: Diagnosis not present

## 2016-07-11 DIAGNOSIS — Z87891 Personal history of nicotine dependence: Secondary | ICD-10-CM | POA: Diagnosis not present

## 2016-07-11 DIAGNOSIS — D4622 Refractory anemia with excess of blasts 2: Secondary | ICD-10-CM | POA: Insufficient documentation

## 2016-07-11 DIAGNOSIS — Z79899 Other long term (current) drug therapy: Secondary | ICD-10-CM | POA: Diagnosis not present

## 2016-07-11 DIAGNOSIS — Z792 Long term (current) use of antibiotics: Secondary | ICD-10-CM | POA: Insufficient documentation

## 2016-07-11 DIAGNOSIS — D46Z Other myelodysplastic syndromes: Secondary | ICD-10-CM

## 2016-07-11 DIAGNOSIS — R5382 Chronic fatigue, unspecified: Secondary | ICD-10-CM | POA: Insufficient documentation

## 2016-07-11 DIAGNOSIS — F418 Other specified anxiety disorders: Secondary | ICD-10-CM | POA: Diagnosis not present

## 2016-07-11 LAB — CBC WITH DIFFERENTIAL/PLATELET
BASOS ABS: 0 10*3/uL (ref 0–0.1)
Basophils Relative: 0 %
Eosinophils Absolute: 0 10*3/uL (ref 0–0.7)
Eosinophils Relative: 0 %
HEMATOCRIT: 25.4 % — AB (ref 35.0–47.0)
Hemoglobin: 8.5 g/dL — ABNORMAL LOW (ref 12.0–16.0)
LYMPHS PCT: 51 %
Lymphs Abs: 1.1 10*3/uL (ref 1.0–3.6)
MCH: 35 pg — ABNORMAL HIGH (ref 26.0–34.0)
MCHC: 33.5 g/dL (ref 32.0–36.0)
MCV: 104.4 fL — AB (ref 80.0–100.0)
Monocytes Absolute: 0.1 10*3/uL — ABNORMAL LOW (ref 0.2–0.9)
Monocytes Relative: 6 %
NEUTROS ABS: 1 10*3/uL — AB (ref 1.4–6.5)
Neutrophils Relative %: 43 %
PLATELETS: 31 10*3/uL — AB (ref 150–440)
RBC: 2.43 MIL/uL — AB (ref 3.80–5.20)
RDW: 19.9 % — ABNORMAL HIGH (ref 11.5–14.5)
WBC: 2.2 10*3/uL — AB (ref 3.6–11.0)

## 2016-07-11 LAB — SAMPLE TO BLOOD BANK

## 2016-07-15 DIAGNOSIS — Z9989 Dependence on other enabling machines and devices: Secondary | ICD-10-CM | POA: Diagnosis not present

## 2016-07-15 DIAGNOSIS — J449 Chronic obstructive pulmonary disease, unspecified: Secondary | ICD-10-CM | POA: Diagnosis not present

## 2016-07-15 DIAGNOSIS — G4733 Obstructive sleep apnea (adult) (pediatric): Secondary | ICD-10-CM | POA: Diagnosis not present

## 2016-07-15 DIAGNOSIS — R0609 Other forms of dyspnea: Secondary | ICD-10-CM | POA: Diagnosis not present

## 2016-07-16 ENCOUNTER — Inpatient Hospital Stay: Payer: Medicare Other

## 2016-07-16 DIAGNOSIS — E119 Type 2 diabetes mellitus without complications: Secondary | ICD-10-CM | POA: Diagnosis not present

## 2016-07-16 DIAGNOSIS — Z87891 Personal history of nicotine dependence: Secondary | ICD-10-CM | POA: Diagnosis not present

## 2016-07-16 DIAGNOSIS — G4730 Sleep apnea, unspecified: Secondary | ICD-10-CM | POA: Diagnosis not present

## 2016-07-16 DIAGNOSIS — N183 Chronic kidney disease, stage 3 (moderate): Secondary | ICD-10-CM | POA: Diagnosis not present

## 2016-07-16 DIAGNOSIS — D46Z Other myelodysplastic syndromes: Secondary | ICD-10-CM

## 2016-07-16 DIAGNOSIS — D469 Myelodysplastic syndrome, unspecified: Secondary | ICD-10-CM | POA: Diagnosis not present

## 2016-07-16 DIAGNOSIS — D631 Anemia in chronic kidney disease: Secondary | ICD-10-CM | POA: Diagnosis not present

## 2016-07-16 DIAGNOSIS — I10 Essential (primary) hypertension: Secondary | ICD-10-CM | POA: Diagnosis not present

## 2016-07-16 DIAGNOSIS — E785 Hyperlipidemia, unspecified: Secondary | ICD-10-CM | POA: Diagnosis not present

## 2016-07-16 DIAGNOSIS — Z7984 Long term (current) use of oral hypoglycemic drugs: Secondary | ICD-10-CM | POA: Diagnosis not present

## 2016-07-16 DIAGNOSIS — Z5111 Encounter for antineoplastic chemotherapy: Secondary | ICD-10-CM | POA: Diagnosis not present

## 2016-07-16 DIAGNOSIS — K219 Gastro-esophageal reflux disease without esophagitis: Secondary | ICD-10-CM | POA: Diagnosis not present

## 2016-07-16 DIAGNOSIS — E1122 Type 2 diabetes mellitus with diabetic chronic kidney disease: Secondary | ICD-10-CM | POA: Diagnosis not present

## 2016-07-16 DIAGNOSIS — R5382 Chronic fatigue, unspecified: Secondary | ICD-10-CM | POA: Diagnosis not present

## 2016-07-16 DIAGNOSIS — I129 Hypertensive chronic kidney disease with stage 1 through stage 4 chronic kidney disease, or unspecified chronic kidney disease: Secondary | ICD-10-CM | POA: Diagnosis not present

## 2016-07-16 DIAGNOSIS — Z792 Long term (current) use of antibiotics: Secondary | ICD-10-CM | POA: Diagnosis not present

## 2016-07-16 DIAGNOSIS — Z79899 Other long term (current) drug therapy: Secondary | ICD-10-CM | POA: Diagnosis not present

## 2016-07-16 DIAGNOSIS — D4622 Refractory anemia with excess of blasts 2: Secondary | ICD-10-CM | POA: Diagnosis not present

## 2016-07-16 LAB — CBC WITH DIFFERENTIAL/PLATELET
BASOS ABS: 0 10*3/uL (ref 0–0.1)
Basophils Relative: 1 %
Eosinophils Absolute: 0 10*3/uL (ref 0–0.7)
Eosinophils Relative: 1 %
HEMATOCRIT: 24.1 % — AB (ref 35.0–47.0)
Hemoglobin: 8.2 g/dL — ABNORMAL LOW (ref 12.0–16.0)
LYMPHS ABS: 1 10*3/uL (ref 1.0–3.6)
LYMPHS PCT: 53 %
MCH: 35 pg — AB (ref 26.0–34.0)
MCHC: 34 g/dL (ref 32.0–36.0)
MCV: 103 fL — AB (ref 80.0–100.0)
MONO ABS: 0.1 10*3/uL — AB (ref 0.2–0.9)
MONOS PCT: 5 %
NEUTROS ABS: 0.7 10*3/uL — AB (ref 1.4–6.5)
Neutrophils Relative %: 40 %
Platelets: 31 10*3/uL — ABNORMAL LOW (ref 150–440)
RBC: 2.34 MIL/uL — ABNORMAL LOW (ref 3.80–5.20)
RDW: 19.9 % — AB (ref 11.5–14.5)
WBC: 1.8 10*3/uL — ABNORMAL LOW (ref 3.6–11.0)

## 2016-07-16 LAB — SAMPLE TO BLOOD BANK

## 2016-07-16 LAB — BASIC METABOLIC PANEL
ANION GAP: 5 (ref 5–15)
BUN: 29 mg/dL — AB (ref 6–20)
CALCIUM: 9.3 mg/dL (ref 8.9–10.3)
CO2: 26 mmol/L (ref 22–32)
Chloride: 108 mmol/L (ref 101–111)
Creatinine, Ser: 1.13 mg/dL — ABNORMAL HIGH (ref 0.44–1.00)
GFR calc Af Amer: 54 mL/min — ABNORMAL LOW (ref >60)
GFR calc non Af Amer: 47 mL/min — ABNORMAL LOW (ref >60)
GLUCOSE: 152 mg/dL — AB (ref 65–99)
Potassium: 5 mmol/L (ref 3.5–5.1)
Sodium: 139 mmol/L (ref 135–145)

## 2016-07-18 ENCOUNTER — Inpatient Hospital Stay: Payer: Medicare Other

## 2016-07-18 DIAGNOSIS — E119 Type 2 diabetes mellitus without complications: Secondary | ICD-10-CM | POA: Diagnosis not present

## 2016-07-18 DIAGNOSIS — D4622 Refractory anemia with excess of blasts 2: Secondary | ICD-10-CM | POA: Diagnosis not present

## 2016-07-18 DIAGNOSIS — D46Z Other myelodysplastic syndromes: Secondary | ICD-10-CM

## 2016-07-18 DIAGNOSIS — Z5111 Encounter for antineoplastic chemotherapy: Secondary | ICD-10-CM | POA: Diagnosis not present

## 2016-07-18 DIAGNOSIS — G4730 Sleep apnea, unspecified: Secondary | ICD-10-CM | POA: Diagnosis not present

## 2016-07-18 DIAGNOSIS — Z792 Long term (current) use of antibiotics: Secondary | ICD-10-CM | POA: Diagnosis not present

## 2016-07-18 DIAGNOSIS — Z79899 Other long term (current) drug therapy: Secondary | ICD-10-CM | POA: Diagnosis not present

## 2016-07-18 DIAGNOSIS — Z7984 Long term (current) use of oral hypoglycemic drugs: Secondary | ICD-10-CM | POA: Diagnosis not present

## 2016-07-18 DIAGNOSIS — R5382 Chronic fatigue, unspecified: Secondary | ICD-10-CM | POA: Diagnosis not present

## 2016-07-18 DIAGNOSIS — K219 Gastro-esophageal reflux disease without esophagitis: Secondary | ICD-10-CM | POA: Diagnosis not present

## 2016-07-18 DIAGNOSIS — I10 Essential (primary) hypertension: Secondary | ICD-10-CM | POA: Diagnosis not present

## 2016-07-18 DIAGNOSIS — Z87891 Personal history of nicotine dependence: Secondary | ICD-10-CM | POA: Diagnosis not present

## 2016-07-18 DIAGNOSIS — E785 Hyperlipidemia, unspecified: Secondary | ICD-10-CM | POA: Diagnosis not present

## 2016-07-18 LAB — CBC WITH DIFFERENTIAL/PLATELET
BASOS ABS: 0 10*3/uL (ref 0–0.1)
Basophils Relative: 0 %
EOS PCT: 0 %
Eosinophils Absolute: 0 10*3/uL (ref 0–0.7)
HEMATOCRIT: 24.4 % — AB (ref 35.0–47.0)
HEMOGLOBIN: 8.3 g/dL — AB (ref 12.0–16.0)
LYMPHS ABS: 1 10*3/uL (ref 1.0–3.6)
LYMPHS PCT: 53 %
MCH: 35 pg — AB (ref 26.0–34.0)
MCHC: 33.9 g/dL (ref 32.0–36.0)
MCV: 103.4 fL — AB (ref 80.0–100.0)
Monocytes Absolute: 0.1 10*3/uL — ABNORMAL LOW (ref 0.2–0.9)
Monocytes Relative: 5 %
NEUTROS ABS: 0.8 10*3/uL — AB (ref 1.4–6.5)
Neutrophils Relative %: 42 %
PLATELETS: 25 10*3/uL — AB (ref 150–440)
RBC: 2.36 MIL/uL — AB (ref 3.80–5.20)
RDW: 19.7 % — ABNORMAL HIGH (ref 11.5–14.5)
WBC: 1.9 10*3/uL — AB (ref 3.6–11.0)

## 2016-07-18 LAB — SAMPLE TO BLOOD BANK

## 2016-07-22 ENCOUNTER — Other Ambulatory Visit: Payer: Self-pay

## 2016-07-22 ENCOUNTER — Inpatient Hospital Stay: Payer: Medicare Other

## 2016-07-22 ENCOUNTER — Inpatient Hospital Stay (HOSPITAL_BASED_OUTPATIENT_CLINIC_OR_DEPARTMENT_OTHER): Payer: Medicare Other | Admitting: Internal Medicine

## 2016-07-22 VITALS — BP 112/69 | HR 62 | Temp 96.7°F | Resp 18 | Wt 234.0 lb

## 2016-07-22 DIAGNOSIS — D4622 Refractory anemia with excess of blasts 2: Secondary | ICD-10-CM

## 2016-07-22 DIAGNOSIS — D46Z Other myelodysplastic syndromes: Secondary | ICD-10-CM

## 2016-07-22 DIAGNOSIS — Z7984 Long term (current) use of oral hypoglycemic drugs: Secondary | ICD-10-CM | POA: Diagnosis not present

## 2016-07-22 DIAGNOSIS — I10 Essential (primary) hypertension: Secondary | ICD-10-CM | POA: Diagnosis not present

## 2016-07-22 DIAGNOSIS — K219 Gastro-esophageal reflux disease without esophagitis: Secondary | ICD-10-CM | POA: Diagnosis not present

## 2016-07-22 DIAGNOSIS — E119 Type 2 diabetes mellitus without complications: Secondary | ICD-10-CM

## 2016-07-22 DIAGNOSIS — Z79899 Other long term (current) drug therapy: Secondary | ICD-10-CM | POA: Diagnosis not present

## 2016-07-22 DIAGNOSIS — R5382 Chronic fatigue, unspecified: Secondary | ICD-10-CM

## 2016-07-22 DIAGNOSIS — Z5111 Encounter for antineoplastic chemotherapy: Secondary | ICD-10-CM | POA: Diagnosis not present

## 2016-07-22 DIAGNOSIS — F418 Other specified anxiety disorders: Secondary | ICD-10-CM

## 2016-07-22 DIAGNOSIS — Z792 Long term (current) use of antibiotics: Secondary | ICD-10-CM

## 2016-07-22 DIAGNOSIS — E669 Obesity, unspecified: Secondary | ICD-10-CM

## 2016-07-22 DIAGNOSIS — G4730 Sleep apnea, unspecified: Secondary | ICD-10-CM

## 2016-07-22 DIAGNOSIS — E785 Hyperlipidemia, unspecified: Secondary | ICD-10-CM | POA: Diagnosis not present

## 2016-07-22 DIAGNOSIS — Z87891 Personal history of nicotine dependence: Secondary | ICD-10-CM

## 2016-07-22 LAB — COMPREHENSIVE METABOLIC PANEL
ALBUMIN: 4 g/dL (ref 3.5–5.0)
ALT: 31 U/L (ref 14–54)
ANION GAP: 7 (ref 5–15)
AST: 24 U/L (ref 15–41)
Alkaline Phosphatase: 76 U/L (ref 38–126)
BUN: 23 mg/dL — AB (ref 6–20)
CO2: 26 mmol/L (ref 22–32)
Calcium: 9.1 mg/dL (ref 8.9–10.3)
Chloride: 105 mmol/L (ref 101–111)
Creatinine, Ser: 1.14 mg/dL — ABNORMAL HIGH (ref 0.44–1.00)
GFR calc Af Amer: 54 mL/min — ABNORMAL LOW (ref >60)
GFR calc non Af Amer: 46 mL/min — ABNORMAL LOW (ref >60)
GLUCOSE: 126 mg/dL — AB (ref 65–99)
POTASSIUM: 4.6 mmol/L (ref 3.5–5.1)
SODIUM: 138 mmol/L (ref 135–145)
Total Bilirubin: 0.5 mg/dL (ref 0.3–1.2)
Total Protein: 7.4 g/dL (ref 6.5–8.1)

## 2016-07-22 LAB — CBC WITH DIFFERENTIAL/PLATELET
BASOS ABS: 0 10*3/uL (ref 0–0.1)
BASOS PCT: 1 %
EOS ABS: 0 10*3/uL (ref 0–0.7)
Eosinophils Relative: 0 %
HCT: 24.7 % — ABNORMAL LOW (ref 35.0–47.0)
HEMOGLOBIN: 8.3 g/dL — AB (ref 12.0–16.0)
Lymphocytes Relative: 46 %
Lymphs Abs: 1 10*3/uL (ref 1.0–3.6)
MCH: 34.9 pg — ABNORMAL HIGH (ref 26.0–34.0)
MCHC: 33.6 g/dL (ref 32.0–36.0)
MCV: 103.8 fL — ABNORMAL HIGH (ref 80.0–100.0)
MONOS PCT: 5 %
Monocytes Absolute: 0.1 10*3/uL — ABNORMAL LOW (ref 0.2–0.9)
NEUTROS ABS: 1.1 10*3/uL — AB (ref 1.4–6.5)
NEUTROS PCT: 48 %
Platelets: 38 10*3/uL — ABNORMAL LOW (ref 150–440)
RBC: 2.38 MIL/uL — ABNORMAL LOW (ref 3.80–5.20)
RDW: 19.6 % — AB (ref 11.5–14.5)
WBC: 2.2 10*3/uL — ABNORMAL LOW (ref 3.6–11.0)

## 2016-07-22 LAB — SAMPLE TO BLOOD BANK

## 2016-07-22 MED ORDER — AZACITIDINE CHEMO SQ INJECTION
75.0000 mg/m2 | Freq: Once | INTRAMUSCULAR | Status: AC
  Administered 2016-07-22: 165 mg via SUBCUTANEOUS
  Filled 2016-07-22: qty 6.6

## 2016-07-22 MED ORDER — ONDANSETRON HCL 4 MG PO TABS
8.0000 mg | ORAL_TABLET | Freq: Once | ORAL | Status: AC
  Administered 2016-07-22: 8 mg via ORAL
  Filled 2016-07-22: qty 2

## 2016-07-22 NOTE — Assessment & Plan Note (Addendum)
Refractory anemia- with excess blasts- II [blasts percentage 14%] FISH- normal karyotype. On vidaza; s/p  cycle #3 today. Status post evaluation at [facility].   # Proceed with cycle #4 day #1 today. White count 2.3 ANC 1.2 white count 8.3 platelets 38 [will review peripheral smear to rule out clumping; patient had previous clumping]. Discussed re; bone marrow Biopsy here or [facility]. Will discuss with Dr.Foster. appt at [facility] on Nov 27th.   # Nose bleeds- resolved. Will transfuse platelets if bleeding.   # prophylactic antibiotics- acyclovir/ diflucan.    # follow up with cycle #4 in 4 weeks/ treatment day-1-7. Labs - cbc-Tuesday/ Thursday- hold tube/ weekly bmp.

## 2016-07-22 NOTE — Progress Notes (Signed)
Patient is here for follow up, she would like to know how things are going so far

## 2016-07-22 NOTE — Progress Notes (Signed)
[facility] NOTE  Patient Care Team: Roselee Nova, [name]  CHIEF COMPLAINTS/PURPOSE OF CONSULTATION:   Oncology History   #JUNE 2017- Severe neutropenia/ Anemia ~hb 9/platlets- 85-100 AUG 2017- REFRACTORY ANEMIA with EXCESS BLASTS [14% blasts- BMBx]; cytogenetics/FISH-N [SNP micorarray- not done]   # AUG 21st-  START AZA 28m/m2 day- 1-7 q 28 days.      MDS (myelodysplastic syndrome), high grade ([place]   04/23/2016 Initial Diagnosis    MDS (myelodysplastic syndrome), high grade (HCC)         HISTORY OF PRESENTING ILLNESS:  [name]89y.o.  female with Recently diagnosed MDS/high-grade currently started on Vidaza Status post cycle #3 proximally 4 weeks ago is here for follow-up.  No more nosebleeds. No more admission the hospital. Denies any significant gum bleeding or need for platelet or blood transfusion.  Patient denies any nausea vomiting.  Denies any fevers. Denies any chills. Continues to chronic fatigue. Not any worse.  ROS: A c is done which is negative except mentioned above in history of present illness  MEDICAL HISTORY:  Past Medical History:  Diagnosis Date  . Abdominal wall mass   . Anginal pain ([place]   . Anxiety   . Arthritis   . Calculus of kidney   . Cystitis   . Depression   . Diabetes mellitus without complication (HCC)    elevated A1c  . Dyspnea on exertion   . Elevated serum creatinine   . Fibrocystic breast disease   . GERD (gastroesophageal reflux disease)   . Hearing loss   . Heart murmur   . HTN (hypertension)   . Hyperlipidemia   . MDS (myelodysplastic syndrome), high grade ([place] 04/23/2016  . Microscopic hematuria   . Mucositis due to chemotherapy   . Obesity   . Risk for falls   . Sleep apnea   . [place]rombocytopenia ([place]   . Urinary frequency   . Urinary urgency     SURGICAL HISTORY: Past Surgical History:  Procedure Laterality Date  . ABDOMINAL HYSTERECTOMY    .  APPENDECTOMY    . CARDIAC CATHETERIZATION     x2  . CHOLECYSTECTOMY    . COLONOSCOPY N/A 02/24/2015   Procedure: COLONOSCOPY;  Surgeon: RManya Silvas MD;  [facility];  Service: Endoscopy;  Laterality: N/A;  . DIAGNOSTIC LAPAROSCOPY     Removal of benign abdominal tumor  . ESOPHAGOGASTRODUODENOSCOPY N/A 02/24/2015   Procedure: ESOPHAGOGASTRODUODENOSCOPY (EGD);  Surgeon: RManya Silvas MD;  [facility];  Service: Endoscopy;  Laterality: N/A;  . right eye surgery Right     SOCIAL HISTORY: [place] family[place] No smoking/ no alcohol.  Social History   Social History  . Marital status: Married    Spouse name: N/A  . Number of children: N/A  . Years of education: N/A   Occupational History  . Not on file.   Social History Main Topics  . Smoking status: Former Smoker    Quit date: 09/09/1988  . Smokeless tobacco: Never Used     Comment: quit 25 years ago  . Alcohol use No  . Drug use: No  . Sexual activity: Not on file   Other Topics Concern  . Not on file   Social History Narrative  . No narrative on file    FAMILY HISTORY: no cancers in family.  Family History  Problem Relation Age of Onset  . Congestive  Heart Failure Mother   . Diabetes Mother   . Coronary artery disease Mother   . Stroke Mother   . Cirrhosis Father     ALLERGIES:  is allergic to macrobid [nitrofurantoin monohyd macro].  MEDICATIONS:  Current Outpatient Prescriptions  Medication Sig Dispense Refill  . acyclovir (ZOVIRAX) 400 MG tablet Take 1 tablet (400 mg total) by mouth 2 (two) times daily. 120 tablet 2  . buPROPion (WELLBUTRIN XL) 150 MG 24 hr tablet Take 300 mg by mouth daily at 12 noon.     . clonazePAM (KLONOPIN) 0.5 MG tablet Take 0.5 mg by mouth 2 (two) times daily.     Marland Kitchen LUMIGAN 0.01 % SOLN Place 1 drop into both eyes at bedtime.     . metFORMIN (GLUCOPHAGE) 500 MG tablet TAKE 1 TABLET BY MOUTH DAILY 90 tablet 0  .  Multiple Vitamin (MULTIVITAMIN WITH MINERALS) TABS tablet Take 1 tablet by mouth daily.    . Omega-3 Fatty Acids (FISH OIL) 1000 MG CAPS Take 1 capsule by mouth daily.    . polyethylene glycol (MIRALAX / GLYCOLAX) packet Take 17 g by mouth daily as needed for mild constipation. 14 each 1  . rosuvastatin (CRESTOR) 20 MG tablet Take 1 tablet (20 mg total) by mouth daily. 90 tablet 1  . sertraline (ZOLOFT) 100 MG tablet Take 100 mg by mouth daily at 12 noon.     Marland Kitchen Umeclidinium-Vilanterol (ANORO ELLIPTA) 62.5-25 MCG/INH AEPB Inhale 1 puff into the lungs daily as needed (shortness of breath).     . clindamycin (CLEOCIN) 300 MG capsule Take 1 capsule (300 mg total) by mouth 3 (three) times daily. (Patient not taking: Reported on 07/22/2016) 30 capsule 0  . fluconazole (DIFLUCAN) 100 MG tablet Take 1 tablet (100 mg total) by mouth daily. (Patient not taking: Reported on 07/22/2016) 30 tablet 3  . hydrocortisone (ANUSOL-HC) 2.5 % rectal cream Place 1 application rectally 2 (two) times daily as needed for hemorrhoids or itching. (Patient not taking: Reported on 07/22/2016) 30 g 0  . ondansetron (ZOFRAN) 8 MG tablet Take 1 tablet (8 mg total) by mouth 2 (two) times daily as needed (Nausea or vomiting). (Patient not taking: Reported on 07/22/2016) 30 tablet 1  . prochlorperazine (COMPAZINE) 10 MG tablet Take 1 tablet (10 mg total) by mouth every 6 (six) hours as needed (Nausea or vomiting). (Patient not taking: Reported on 07/22/2016) 30 tablet 1   No current facility-administered medications for this visit.       Marland Kitchen  PHYSICAL EXAMINATION: ECOG PERFORMANCE STATUS: 1 - Symptomatic but completely ambulatory  Vitals:   07/22/16 1153  BP: 112/69  Pulse: 62  Resp: 18  Temp: (!) 96.7 F (35.9 C)   Filed Weights   07/22/16 1153  Weight: 234 lb (106.1 kg)    GENERAL: Well-nourished well-developed; Alert, no distress and comfortable.   Obese. Accompanied by her sister-in-law/husband. She is walking  herself.Today she is in a wheelchair.  EYES: Positive for pallor. OROPHARYNX: no thrush or ulceration;  NECK: supple, no masses felt LYMPH:  no palpable lymphadenopathy in the cervical, axillary or inguinal regions LUNGS: clear to auscultation and  No wheeze or crackles HEART/CVS: regular rate & rhythm and no murmurs; No lower extremity edema ABDOMEN: abdomen soft, non-tender and normal bowel sounds Musculoskeletal:no cyanosis of digits and no clubbing  PSYCH: alert & oriented x 3 with fluent speech NEURO: no focal motor/sensory deficits SKIN:  ablation noted on the right leg /improving no signs of infection.  LABORATORY DATA:  I have reviewed the data as listed Lab Results  Component Value Date   WBC 2.2 (L) 07/22/2016   HGB 8.3 (L) 07/22/2016   HCT 24.7 (L) 07/22/2016   MCV 103.8 (H) 07/22/2016   PLT 38 (L) 07/22/2016    Recent Labs  05/07/16 1652  06/19/16 1048  06/24/16 0936  07/09/16 1031 07/16/16 1016 07/22/16 1017  NA  --   < > 141  < > 142  < > 141 139 138  K  --   < > 3.9  < > 4.5  < > 4.4 5.0 4.6  CL  --   < > 106  < > 107  < > 107 108 105  CO2  --   < > 26  < > 27  < > 26 26 26   GLUCOSE  --   < > 119*  < > 127*  < > 137* 152* 126*  BUN  --   < > 14  < > 28*  < > 23* 29* 23*  CREATININE  --   < > 0.91  < > 1.01*  < > 1.08* 1.13* 1.14*  CALCIUM  --   < > 9.5  < > 9.5  < > 9.2 9.3 9.1  GFRNONAA  --   < > >60  < > 53*  < > 49* 47* 46*  GFRAA  --   < > >60  < > >60  < > 57* 54* 54*  PROT 8.3*  < > 7.9  --  7.4  --   --   --  7.4  ALBUMIN 3.8  < > 4.1  --  3.9  --   --   --  4.0  AST 26  < > 29  --  21  --   --   --  24  ALT 25  < > 29  --  21  --   --   --  31  ALKPHOS 65  < > 85  --  70  --   --   --  76  BILITOT 0.6  < > 0.4  --  0.3  --   --   --  0.5  BILIDIR 0.1  --   --   --   --   --   --   --   --   IBILI 0.5  --   --   --   --   --   --   --   --   < > = values in this interval not displayed.  RADIOGRAPHIC STUDIES: I have personally reviewed the  radiological images as listed and agreed with the findings in the report. No results found.  ASSESSMENT & PLAN:   MDS (myelodysplastic syndrome), high grade (HCC) Refractory anemia- with excess blasts- II [blasts percentage 14%] FISH- normal karyotype. On vidaza; s/p  cycle #3 today. Status post evaluation at [facility].   # Proceed with cycle #4 day #1 today. White count 2.3 ANC 1.2 white count 8.3 platelets 38 [will review peripheral smear to rule out clumping; patient had previous clumping]. Discussed re; bone marrow Biopsy here or [facility]. Will discuss with Dr.Foster. appt at [facility] on Nov 27th.   # Nose bleeds- resolved. Will transfuse platelets if bleeding.   # prophylactic antibiotics- acyclovir/ diflucan.    # follow up with cycle #4 in 4 weeks/ treatment day-1-7. Labs - cbc-Tuesday/ Thursday- hold tube/ weekly bmp.  Cammie Sickle, MD 07/22/2016 1:24 PM

## 2016-07-23 ENCOUNTER — Inpatient Hospital Stay: Payer: Medicare Other

## 2016-07-23 VITALS — BP 111/67 | HR 81 | Temp 97.8°F | Resp 18

## 2016-07-23 DIAGNOSIS — Z87891 Personal history of nicotine dependence: Secondary | ICD-10-CM | POA: Diagnosis not present

## 2016-07-23 DIAGNOSIS — I10 Essential (primary) hypertension: Secondary | ICD-10-CM | POA: Diagnosis not present

## 2016-07-23 DIAGNOSIS — E119 Type 2 diabetes mellitus without complications: Secondary | ICD-10-CM | POA: Diagnosis not present

## 2016-07-23 DIAGNOSIS — R5382 Chronic fatigue, unspecified: Secondary | ICD-10-CM | POA: Diagnosis not present

## 2016-07-23 DIAGNOSIS — Z79899 Other long term (current) drug therapy: Secondary | ICD-10-CM | POA: Diagnosis not present

## 2016-07-23 DIAGNOSIS — Z7984 Long term (current) use of oral hypoglycemic drugs: Secondary | ICD-10-CM | POA: Diagnosis not present

## 2016-07-23 DIAGNOSIS — D4622 Refractory anemia with excess of blasts 2: Secondary | ICD-10-CM | POA: Diagnosis not present

## 2016-07-23 DIAGNOSIS — D46Z Other myelodysplastic syndromes: Secondary | ICD-10-CM

## 2016-07-23 DIAGNOSIS — Z5111 Encounter for antineoplastic chemotherapy: Secondary | ICD-10-CM | POA: Diagnosis not present

## 2016-07-23 DIAGNOSIS — G4730 Sleep apnea, unspecified: Secondary | ICD-10-CM | POA: Diagnosis not present

## 2016-07-23 DIAGNOSIS — K219 Gastro-esophageal reflux disease without esophagitis: Secondary | ICD-10-CM | POA: Diagnosis not present

## 2016-07-23 DIAGNOSIS — E785 Hyperlipidemia, unspecified: Secondary | ICD-10-CM | POA: Diagnosis not present

## 2016-07-23 DIAGNOSIS — Z792 Long term (current) use of antibiotics: Secondary | ICD-10-CM | POA: Diagnosis not present

## 2016-07-23 MED ORDER — AZACITIDINE CHEMO SQ INJECTION
75.0000 mg/m2 | Freq: Once | INTRAMUSCULAR | Status: AC
  Administered 2016-07-23: 165 mg via SUBCUTANEOUS
  Filled 2016-07-23: qty 6.6

## 2016-07-23 MED ORDER — ONDANSETRON HCL 4 MG PO TABS
8.0000 mg | ORAL_TABLET | Freq: Once | ORAL | Status: AC
  Administered 2016-07-23: 8 mg via ORAL
  Filled 2016-07-23: qty 2

## 2016-07-24 ENCOUNTER — Inpatient Hospital Stay: Payer: Medicare Other

## 2016-07-24 VITALS — BP 103/65 | HR 76 | Temp 97.8°F | Resp 16

## 2016-07-24 DIAGNOSIS — D46Z Other myelodysplastic syndromes: Secondary | ICD-10-CM

## 2016-07-24 DIAGNOSIS — E785 Hyperlipidemia, unspecified: Secondary | ICD-10-CM | POA: Diagnosis not present

## 2016-07-24 DIAGNOSIS — Z5111 Encounter for antineoplastic chemotherapy: Secondary | ICD-10-CM | POA: Diagnosis not present

## 2016-07-24 DIAGNOSIS — K219 Gastro-esophageal reflux disease without esophagitis: Secondary | ICD-10-CM | POA: Diagnosis not present

## 2016-07-24 DIAGNOSIS — Z87891 Personal history of nicotine dependence: Secondary | ICD-10-CM | POA: Diagnosis not present

## 2016-07-24 DIAGNOSIS — D4622 Refractory anemia with excess of blasts 2: Secondary | ICD-10-CM | POA: Diagnosis not present

## 2016-07-24 DIAGNOSIS — Z7984 Long term (current) use of oral hypoglycemic drugs: Secondary | ICD-10-CM | POA: Diagnosis not present

## 2016-07-24 DIAGNOSIS — R5382 Chronic fatigue, unspecified: Secondary | ICD-10-CM | POA: Diagnosis not present

## 2016-07-24 DIAGNOSIS — Z79899 Other long term (current) drug therapy: Secondary | ICD-10-CM | POA: Diagnosis not present

## 2016-07-24 DIAGNOSIS — I10 Essential (primary) hypertension: Secondary | ICD-10-CM | POA: Diagnosis not present

## 2016-07-24 DIAGNOSIS — Z792 Long term (current) use of antibiotics: Secondary | ICD-10-CM | POA: Diagnosis not present

## 2016-07-24 DIAGNOSIS — G4730 Sleep apnea, unspecified: Secondary | ICD-10-CM | POA: Diagnosis not present

## 2016-07-24 DIAGNOSIS — E119 Type 2 diabetes mellitus without complications: Secondary | ICD-10-CM | POA: Diagnosis not present

## 2016-07-24 MED ORDER — AZACITIDINE CHEMO SQ INJECTION
75.0000 mg/m2 | Freq: Once | INTRAMUSCULAR | Status: AC
  Administered 2016-07-24: 165 mg via SUBCUTANEOUS
  Filled 2016-07-24: qty 6.6

## 2016-07-24 MED ORDER — ONDANSETRON HCL 4 MG PO TABS
8.0000 mg | ORAL_TABLET | Freq: Once | ORAL | Status: AC
  Administered 2016-07-24: 8 mg via ORAL
  Filled 2016-07-24: qty 2

## 2016-07-25 ENCOUNTER — Inpatient Hospital Stay: Payer: Medicare Other

## 2016-07-25 VITALS — BP 110/66 | HR 78 | Temp 97.2°F | Resp 18

## 2016-07-25 DIAGNOSIS — R5382 Chronic fatigue, unspecified: Secondary | ICD-10-CM | POA: Diagnosis not present

## 2016-07-25 DIAGNOSIS — Z87891 Personal history of nicotine dependence: Secondary | ICD-10-CM | POA: Diagnosis not present

## 2016-07-25 DIAGNOSIS — D4622 Refractory anemia with excess of blasts 2: Secondary | ICD-10-CM | POA: Diagnosis not present

## 2016-07-25 DIAGNOSIS — G4730 Sleep apnea, unspecified: Secondary | ICD-10-CM | POA: Diagnosis not present

## 2016-07-25 DIAGNOSIS — Z792 Long term (current) use of antibiotics: Secondary | ICD-10-CM | POA: Diagnosis not present

## 2016-07-25 DIAGNOSIS — E785 Hyperlipidemia, unspecified: Secondary | ICD-10-CM | POA: Diagnosis not present

## 2016-07-25 DIAGNOSIS — E119 Type 2 diabetes mellitus without complications: Secondary | ICD-10-CM | POA: Diagnosis not present

## 2016-07-25 DIAGNOSIS — D46Z Other myelodysplastic syndromes: Secondary | ICD-10-CM

## 2016-07-25 DIAGNOSIS — I10 Essential (primary) hypertension: Secondary | ICD-10-CM | POA: Diagnosis not present

## 2016-07-25 DIAGNOSIS — K219 Gastro-esophageal reflux disease without esophagitis: Secondary | ICD-10-CM | POA: Diagnosis not present

## 2016-07-25 DIAGNOSIS — Z7984 Long term (current) use of oral hypoglycemic drugs: Secondary | ICD-10-CM | POA: Diagnosis not present

## 2016-07-25 DIAGNOSIS — Z5111 Encounter for antineoplastic chemotherapy: Secondary | ICD-10-CM | POA: Diagnosis not present

## 2016-07-25 DIAGNOSIS — Z79899 Other long term (current) drug therapy: Secondary | ICD-10-CM | POA: Diagnosis not present

## 2016-07-25 MED ORDER — AZACITIDINE CHEMO SQ INJECTION
75.0000 mg/m2 | Freq: Once | INTRAMUSCULAR | Status: AC
  Administered 2016-07-25: 165 mg via SUBCUTANEOUS
  Filled 2016-07-25: qty 6.6

## 2016-07-25 MED ORDER — ONDANSETRON HCL 4 MG PO TABS
8.0000 mg | ORAL_TABLET | Freq: Once | ORAL | Status: AC
  Administered 2016-07-25: 8 mg via ORAL
  Filled 2016-07-25: qty 2

## 2016-07-26 ENCOUNTER — Inpatient Hospital Stay: Payer: Medicare Other

## 2016-07-26 DIAGNOSIS — G4730 Sleep apnea, unspecified: Secondary | ICD-10-CM | POA: Diagnosis not present

## 2016-07-26 DIAGNOSIS — E119 Type 2 diabetes mellitus without complications: Secondary | ICD-10-CM | POA: Diagnosis not present

## 2016-07-26 DIAGNOSIS — I10 Essential (primary) hypertension: Secondary | ICD-10-CM | POA: Diagnosis not present

## 2016-07-26 DIAGNOSIS — K219 Gastro-esophageal reflux disease without esophagitis: Secondary | ICD-10-CM | POA: Diagnosis not present

## 2016-07-26 DIAGNOSIS — Z5111 Encounter for antineoplastic chemotherapy: Secondary | ICD-10-CM | POA: Diagnosis not present

## 2016-07-26 DIAGNOSIS — Z7984 Long term (current) use of oral hypoglycemic drugs: Secondary | ICD-10-CM | POA: Diagnosis not present

## 2016-07-26 DIAGNOSIS — R5382 Chronic fatigue, unspecified: Secondary | ICD-10-CM | POA: Diagnosis not present

## 2016-07-26 DIAGNOSIS — Z79899 Other long term (current) drug therapy: Secondary | ICD-10-CM | POA: Diagnosis not present

## 2016-07-26 DIAGNOSIS — D4622 Refractory anemia with excess of blasts 2: Secondary | ICD-10-CM | POA: Diagnosis not present

## 2016-07-26 DIAGNOSIS — D46Z Other myelodysplastic syndromes: Secondary | ICD-10-CM

## 2016-07-26 DIAGNOSIS — Z792 Long term (current) use of antibiotics: Secondary | ICD-10-CM | POA: Diagnosis not present

## 2016-07-26 DIAGNOSIS — E785 Hyperlipidemia, unspecified: Secondary | ICD-10-CM | POA: Diagnosis not present

## 2016-07-26 DIAGNOSIS — Z87891 Personal history of nicotine dependence: Secondary | ICD-10-CM | POA: Diagnosis not present

## 2016-07-26 MED ORDER — ONDANSETRON HCL 4 MG PO TABS
8.0000 mg | ORAL_TABLET | Freq: Once | ORAL | Status: AC
Start: 2016-07-26 — End: 2016-07-26
  Administered 2016-07-26: 8 mg via ORAL
  Filled 2016-07-26: qty 2

## 2016-07-26 MED ORDER — AZACITIDINE CHEMO SQ INJECTION
75.0000 mg/m2 | Freq: Once | INTRAMUSCULAR | Status: AC
  Administered 2016-07-26: 165 mg via SUBCUTANEOUS
  Filled 2016-07-26: qty 6.6

## 2016-07-29 ENCOUNTER — Inpatient Hospital Stay: Payer: Medicare Other

## 2016-07-29 ENCOUNTER — Ambulatory Visit
Admission: RE | Admit: 2016-07-29 | Discharge: 2016-07-29 | Disposition: A | Discharge status: Home / Self Care | Payer: Medicare Other | Source: Ambulatory Visit | Attending: Internal Medicine | Admitting: Internal Medicine

## 2016-07-29 ENCOUNTER — Telehealth: Payer: Self-pay

## 2016-07-29 ENCOUNTER — Other Ambulatory Visit: Payer: Self-pay | Admitting: Internal Medicine

## 2016-07-29 VITALS — BP 108/68 | HR 76 | Temp 97.5°F | Resp 18

## 2016-07-29 DIAGNOSIS — E785 Hyperlipidemia, unspecified: Secondary | ICD-10-CM | POA: Diagnosis not present

## 2016-07-29 DIAGNOSIS — Z5111 Encounter for antineoplastic chemotherapy: Secondary | ICD-10-CM | POA: Diagnosis not present

## 2016-07-29 DIAGNOSIS — Z7984 Long term (current) use of oral hypoglycemic drugs: Secondary | ICD-10-CM | POA: Diagnosis not present

## 2016-07-29 DIAGNOSIS — R059 Cough, unspecified: Secondary | ICD-10-CM

## 2016-07-29 DIAGNOSIS — E119 Type 2 diabetes mellitus without complications: Secondary | ICD-10-CM | POA: Diagnosis not present

## 2016-07-29 DIAGNOSIS — D46Z Other myelodysplastic syndromes: Secondary | ICD-10-CM

## 2016-07-29 DIAGNOSIS — D4622 Refractory anemia with excess of blasts 2: Secondary | ICD-10-CM | POA: Diagnosis not present

## 2016-07-29 DIAGNOSIS — R05 Cough: Secondary | ICD-10-CM | POA: Insufficient documentation

## 2016-07-29 DIAGNOSIS — Z87891 Personal history of nicotine dependence: Secondary | ICD-10-CM | POA: Diagnosis not present

## 2016-07-29 DIAGNOSIS — R5382 Chronic fatigue, unspecified: Secondary | ICD-10-CM | POA: Diagnosis not present

## 2016-07-29 DIAGNOSIS — K219 Gastro-esophageal reflux disease without esophagitis: Secondary | ICD-10-CM | POA: Diagnosis not present

## 2016-07-29 DIAGNOSIS — Z792 Long term (current) use of antibiotics: Secondary | ICD-10-CM | POA: Diagnosis not present

## 2016-07-29 DIAGNOSIS — G4730 Sleep apnea, unspecified: Secondary | ICD-10-CM | POA: Diagnosis not present

## 2016-07-29 DIAGNOSIS — I10 Essential (primary) hypertension: Secondary | ICD-10-CM | POA: Diagnosis not present

## 2016-07-29 DIAGNOSIS — Z79899 Other long term (current) drug therapy: Secondary | ICD-10-CM | POA: Diagnosis not present

## 2016-07-29 LAB — BASIC METABOLIC PANEL
ANION GAP: 7 (ref 5–15)
BUN: 27 mg/dL — ABNORMAL HIGH (ref 6–20)
CHLORIDE: 104 mmol/L (ref 101–111)
CO2: 29 mmol/L (ref 22–32)
Calcium: 9.2 mg/dL (ref 8.9–10.3)
Creatinine, Ser: 1.42 mg/dL — ABNORMAL HIGH (ref 0.44–1.00)
GFR calc non Af Amer: 35 mL/min — ABNORMAL LOW (ref >60)
GFR, EST AFRICAN AMERICAN: 41 mL/min — AB (ref >60)
GLUCOSE: 131 mg/dL — AB (ref 65–99)
POTASSIUM: 4.9 mmol/L (ref 3.5–5.1)
Sodium: 140 mmol/L (ref 135–145)

## 2016-07-29 LAB — CBC WITH DIFFERENTIAL/PLATELET
BASOS ABS: 0 10*3/uL (ref 0–0.1)
Basophils Relative: 1 %
Eosinophils Absolute: 0 10*3/uL (ref 0–0.7)
Eosinophils Relative: 1 %
HEMATOCRIT: 23.4 % — AB (ref 35.0–47.0)
HEMOGLOBIN: 7.8 g/dL — AB (ref 12.0–16.0)
LYMPHS PCT: 38 %
Lymphs Abs: 0.9 10*3/uL — ABNORMAL LOW (ref 1.0–3.6)
MCH: 34.7 pg — ABNORMAL HIGH (ref 26.0–34.0)
MCHC: 33.3 g/dL (ref 32.0–36.0)
MCV: 104.1 fL — AB (ref 80.0–100.0)
MONO ABS: 0.2 10*3/uL (ref 0.2–0.9)
Monocytes Relative: 10 %
NEUTROS ABS: 1.2 10*3/uL — AB (ref 1.4–6.5)
NEUTROS PCT: 50 %
Platelets: 126 10*3/uL — ABNORMAL LOW (ref 150–440)
RBC: 2.25 MIL/uL — AB (ref 3.80–5.20)
RDW: 20 % — AB (ref 11.5–14.5)
WBC: 2.4 10*3/uL — ABNORMAL LOW (ref 3.6–11.0)

## 2016-07-29 LAB — SAMPLE TO BLOOD BANK

## 2016-07-29 MED ORDER — ONDANSETRON HCL 4 MG PO TABS: 8.0000 mg | ORAL_TABLET | Freq: Once | ORAL | Status: DC

## 2016-07-29 MED ORDER — AZACITIDINE CHEMO SQ INJECTION
75.0000 mg/m2 | Freq: Once | INTRAMUSCULAR | Status: AC
  Administered 2016-07-29: 165 mg via SUBCUTANEOUS
  Filled 2016-07-29: qty 6.6

## 2016-07-29 MED ORDER — ONDANSETRON 8 MG PO TBDP
8.0000 mg | ORAL_TABLET | Freq: Once | ORAL | Status: AC
  Administered 2016-07-29: 8 mg via ORAL
  Filled 2016-07-29: qty 1

## 2016-07-29 NOTE — Telephone Encounter (Signed)
Patient has been notified of results and voiced understanding. 

## 2016-07-29 NOTE — Telephone Encounter (Signed)
-----   Message from Sabino Gasser, RN sent at 07/29/2016  4:20 PM EST -----   ----- Message ----- From: Cammie Sickle, MD Sent: 07/29/2016   3:29 PM To: Sabino Gasser, RN  Please inform pt that cxr is clear- recommend Claritin for sinuses/ OTC cough medication.

## 2016-07-29 NOTE — Progress Notes (Signed)
Called and LMTCB on patient voicemail

## 2016-07-30 ENCOUNTER — Inpatient Hospital Stay: Payer: Medicare Other

## 2016-07-30 VITALS — BP 124/68 | HR 83 | Temp 96.4°F | Resp 18

## 2016-07-30 DIAGNOSIS — D4622 Refractory anemia with excess of blasts 2: Secondary | ICD-10-CM | POA: Diagnosis not present

## 2016-07-30 DIAGNOSIS — E785 Hyperlipidemia, unspecified: Secondary | ICD-10-CM | POA: Diagnosis not present

## 2016-07-30 DIAGNOSIS — I10 Essential (primary) hypertension: Secondary | ICD-10-CM | POA: Diagnosis not present

## 2016-07-30 DIAGNOSIS — E119 Type 2 diabetes mellitus without complications: Secondary | ICD-10-CM | POA: Diagnosis not present

## 2016-07-30 DIAGNOSIS — Z5111 Encounter for antineoplastic chemotherapy: Secondary | ICD-10-CM | POA: Diagnosis not present

## 2016-07-30 DIAGNOSIS — R5382 Chronic fatigue, unspecified: Secondary | ICD-10-CM | POA: Diagnosis not present

## 2016-07-30 DIAGNOSIS — G4730 Sleep apnea, unspecified: Secondary | ICD-10-CM | POA: Diagnosis not present

## 2016-07-30 DIAGNOSIS — Z79899 Other long term (current) drug therapy: Secondary | ICD-10-CM | POA: Diagnosis not present

## 2016-07-30 DIAGNOSIS — Z7984 Long term (current) use of oral hypoglycemic drugs: Secondary | ICD-10-CM | POA: Diagnosis not present

## 2016-07-30 DIAGNOSIS — K219 Gastro-esophageal reflux disease without esophagitis: Secondary | ICD-10-CM | POA: Diagnosis not present

## 2016-07-30 DIAGNOSIS — Z792 Long term (current) use of antibiotics: Secondary | ICD-10-CM | POA: Diagnosis not present

## 2016-07-30 DIAGNOSIS — Z87891 Personal history of nicotine dependence: Secondary | ICD-10-CM | POA: Diagnosis not present

## 2016-07-30 DIAGNOSIS — D46Z Other myelodysplastic syndromes: Secondary | ICD-10-CM

## 2016-07-30 MED ORDER — AZACITIDINE CHEMO SQ INJECTION
75.0000 mg/m2 | Freq: Once | INTRAMUSCULAR | Status: AC
  Administered 2016-07-30: 165 mg via SUBCUTANEOUS
  Filled 2016-07-30: qty 2.6

## 2016-07-30 MED ORDER — ONDANSETRON HCL 4 MG PO TABS
8.0000 mg | ORAL_TABLET | Freq: Once | ORAL | Status: AC
  Administered 2016-07-30: 8 mg via ORAL
  Filled 2016-07-30: qty 2

## 2016-08-05 DIAGNOSIS — D46Z Other myelodysplastic syndromes: Secondary | ICD-10-CM | POA: Diagnosis not present

## 2016-08-05 DIAGNOSIS — Z23 Encounter for immunization: Secondary | ICD-10-CM | POA: Diagnosis not present

## 2016-08-10 ENCOUNTER — Telehealth: Payer: Self-pay | Admitting: Internal Medicine

## 2016-08-10 ENCOUNTER — Other Ambulatory Visit: Payer: Self-pay | Admitting: Internal Medicine

## 2016-08-10 DIAGNOSIS — D46Z Other myelodysplastic syndromes: Secondary | ICD-10-CM

## 2016-08-10 NOTE — Telephone Encounter (Signed)
Reviewed the note from Dr. Royce Macadamia; [facility]. Plan bone marrow biopsy ASAP/ week of December 4th.   Heather- please schedule the pt for BMBx. Thx

## 2016-08-13 ENCOUNTER — Other Ambulatory Visit: Payer: Self-pay | Admitting: General Surgery

## 2016-08-14 ENCOUNTER — Ambulatory Visit
Admission: RE | Admit: 2016-08-14 | Discharge: 2016-08-14 | Disposition: A | Discharge status: Home / Self Care | Payer: Medicare Other | Source: Ambulatory Visit | Attending: Internal Medicine | Admitting: Internal Medicine

## 2016-08-14 ENCOUNTER — Encounter: Payer: Self-pay | Admitting: Internal Medicine

## 2016-08-14 DIAGNOSIS — D696 Thrombocytopenia, unspecified: Secondary | ICD-10-CM | POA: Diagnosis not present

## 2016-08-14 DIAGNOSIS — D61818 Other pancytopenia: Secondary | ICD-10-CM | POA: Diagnosis not present

## 2016-08-14 DIAGNOSIS — D469 Myelodysplastic syndrome, unspecified: Secondary | ICD-10-CM | POA: Diagnosis not present

## 2016-08-14 DIAGNOSIS — D539 Nutritional anemia, unspecified: Secondary | ICD-10-CM | POA: Diagnosis not present

## 2016-08-14 DIAGNOSIS — D709 Neutropenia, unspecified: Secondary | ICD-10-CM | POA: Diagnosis not present

## 2016-08-14 DIAGNOSIS — D46Z Other myelodysplastic syndromes: Secondary | ICD-10-CM | POA: Diagnosis not present

## 2016-08-14 LAB — DIFFERENTIAL
BASOS ABS: 0 10*3/uL (ref 0–0.1)
BASOS PCT: 0 %
EOS ABS: 0 10*3/uL (ref 0–0.7)
EOS PCT: 0 %
LYMPHS PCT: 45 %
Lymphs Abs: 1.1 10*3/uL (ref 1.0–3.6)
Monocytes Absolute: 0.1 10*3/uL — ABNORMAL LOW (ref 0.2–0.9)
Monocytes Relative: 5 %
Neutro Abs: 1.2 10*3/uL — ABNORMAL LOW (ref 1.4–6.5)
Neutrophils Relative %: 50 %

## 2016-08-14 LAB — CBC
HEMATOCRIT: 25.7 % — AB (ref 35.0–47.0)
HEMOGLOBIN: 8.6 g/dL — AB (ref 12.0–16.0)
MCH: 34.8 pg — AB (ref 26.0–34.0)
MCHC: 33.4 g/dL (ref 32.0–36.0)
MCV: 104 fL — AB (ref 80.0–100.0)
Platelets: 36 10*3/uL — ABNORMAL LOW (ref 150–440)
RBC: 2.47 MIL/uL — AB (ref 3.80–5.20)
RDW: 19 % — ABNORMAL HIGH (ref 11.5–14.5)
WBC: 2.4 10*3/uL — ABNORMAL LOW (ref 3.6–11.0)

## 2016-08-14 LAB — PROTIME-INR
INR: 1.19
Prothrombin Time: 15.2 seconds (ref 11.4–15.2)

## 2016-08-14 LAB — APTT: aPTT: 42 seconds — ABNORMAL HIGH (ref 24–36)

## 2016-08-14 MED ORDER — SODIUM CHLORIDE 0.9 % IV SOLN
INTRAVENOUS | Status: DC
  Administered 2016-08-14: 09:00:00 via INTRAVENOUS

## 2016-08-14 MED ORDER — MIDAZOLAM HCL 2 MG/2ML IJ SOLN
INTRAMUSCULAR | Status: AC | PRN
  Administered 2016-08-14 (×2): 1 mg via INTRAVENOUS

## 2016-08-14 MED ORDER — FENTANYL CITRATE (PF) 100 MCG/2ML IJ SOLN
INTRAMUSCULAR | Status: AC | PRN
  Administered 2016-08-14 (×2): 50 ug via INTRAVENOUS

## 2016-08-14 MED ORDER — HYDROCODONE-ACETAMINOPHEN 5-325 MG PO TABS: 1.0000 | ORAL_TABLET | ORAL | Status: DC | PRN

## 2016-08-14 NOTE — CV Procedure (Signed)
Under CT guidance, bone marrow aspiration and biopsy of right iliac bone was performed. No immediate complication.

## 2016-08-14 NOTE — OR Nursing (Signed)
Pt labs reviewed with Dr Nyoka Cowden

## 2016-08-14 NOTE — Discharge Instructions (Signed)
Bone Marrow Aspiration and Bone Marrow Biopsy, Adult, Care After This sheet gives you information about how to care for yourself after your procedure. Your health care provider may also give you more specific instructions. If you have problems or questions, contact your health care provider. What can I expect after the procedure? After the procedure, it is common to have:  Mild pain and tenderness.  Swelling.  Bruising. Follow these instructions at home:  Take over-the-counter or prescription medicines only as told by your health care provider.  Do not take baths, swim, or use a hot tub until your health care provider approves. Ask if you can take a shower or have a sponge bath.  Follow instructions from your health care provider about how to take care of the puncture site. Make sure you:  Wash your hands with soap and water before you change your bandage (dressing). If soap and water are not available, use hand sanitizer.  Change your dressing as told by your health care provider.  Check your puncture siteevery day for signs of infection. Check for:  More redness, swelling, or pain.  More fluid or blood.  Warmth.  Pus or a bad smell.  Return to your normal activities as told by your health care provider. Ask your health care provider what activities are safe for you.  Do not drive for 24 hours if you were given a medicine to help you relax (sedative).  Keep all follow-up visits as told by your health care provider. This is important. Contact a health care provider if:  You have more redness, swelling, or pain around the puncture site.  You have more fluid or blood coming from the puncture site.  Your puncture site feels warm to the touch.  You have pus or a bad smell coming from the puncture site.  You have a fever.  Your pain is not controlled with medicine. This information is not intended to replace advice given to you by your health care provider. Make sure you  discuss any questions you have with your health care provider. Document Released: 03/15/2005 Document Revised: 03/15/2016 Document Reviewed: 02/07/2016 Elsevier Interactive Patient Education  2017 [employer].

## 2016-08-14 NOTE — Procedures (Signed)
Procedure and risks discussed with patient. Informed consent obtained. Will perform CT-guided bone marrow biopsy. 

## 2016-08-19 ENCOUNTER — Inpatient Hospital Stay: Payer: Medicare Other | Attending: Internal Medicine | Admitting: Internal Medicine

## 2016-08-19 ENCOUNTER — Inpatient Hospital Stay: Payer: Medicare Other

## 2016-08-19 ENCOUNTER — Other Ambulatory Visit: Payer: Self-pay

## 2016-08-19 VITALS — BP 134/71 | HR 64 | Temp 97.0°F | Wt 233.4 lb

## 2016-08-19 DIAGNOSIS — K219 Gastro-esophageal reflux disease without esophagitis: Secondary | ICD-10-CM | POA: Insufficient documentation

## 2016-08-19 DIAGNOSIS — D61818 Other pancytopenia: Secondary | ICD-10-CM | POA: Insufficient documentation

## 2016-08-19 DIAGNOSIS — Z5111 Encounter for antineoplastic chemotherapy: Secondary | ICD-10-CM | POA: Diagnosis not present

## 2016-08-19 DIAGNOSIS — Z87891 Personal history of nicotine dependence: Secondary | ICD-10-CM | POA: Insufficient documentation

## 2016-08-19 DIAGNOSIS — N183 Chronic kidney disease, stage 3 (moderate): Secondary | ICD-10-CM | POA: Insufficient documentation

## 2016-08-19 DIAGNOSIS — I129 Hypertensive chronic kidney disease with stage 1 through stage 4 chronic kidney disease, or unspecified chronic kidney disease: Secondary | ICD-10-CM | POA: Diagnosis not present

## 2016-08-19 DIAGNOSIS — R233 Spontaneous ecchymoses: Secondary | ICD-10-CM | POA: Insufficient documentation

## 2016-08-19 DIAGNOSIS — Z79899 Other long term (current) drug therapy: Secondary | ICD-10-CM

## 2016-08-19 DIAGNOSIS — D4622 Refractory anemia with excess of blasts 2: Secondary | ICD-10-CM

## 2016-08-19 DIAGNOSIS — Z87442 Personal history of urinary calculi: Secondary | ICD-10-CM | POA: Diagnosis not present

## 2016-08-19 DIAGNOSIS — Z7901 Long term (current) use of anticoagulants: Secondary | ICD-10-CM | POA: Diagnosis not present

## 2016-08-19 DIAGNOSIS — E785 Hyperlipidemia, unspecified: Secondary | ICD-10-CM | POA: Insufficient documentation

## 2016-08-19 DIAGNOSIS — D46Z Other myelodysplastic syndromes: Secondary | ICD-10-CM

## 2016-08-19 DIAGNOSIS — E1122 Type 2 diabetes mellitus with diabetic chronic kidney disease: Secondary | ICD-10-CM | POA: Insufficient documentation

## 2016-08-19 DIAGNOSIS — Z7984 Long term (current) use of oral hypoglycemic drugs: Secondary | ICD-10-CM | POA: Diagnosis not present

## 2016-08-19 DIAGNOSIS — R5382 Chronic fatigue, unspecified: Secondary | ICD-10-CM

## 2016-08-19 LAB — BASIC METABOLIC PANEL
ANION GAP: 6 (ref 5–15)
BUN: 18 mg/dL (ref 6–20)
CALCIUM: 9.2 mg/dL (ref 8.9–10.3)
CO2: 28 mmol/L (ref 22–32)
Chloride: 104 mmol/L (ref 101–111)
Creatinine, Ser: 1.12 mg/dL — ABNORMAL HIGH (ref 0.44–1.00)
GFR, EST AFRICAN AMERICAN: 54 mL/min — AB (ref >60)
GFR, EST NON AFRICAN AMERICAN: 47 mL/min — AB (ref >60)
GLUCOSE: 111 mg/dL — AB (ref 65–99)
Potassium: 4.4 mmol/L (ref 3.5–5.1)
SODIUM: 138 mmol/L (ref 135–145)

## 2016-08-19 LAB — CBC WITH DIFFERENTIAL/PLATELET
BASOS ABS: 0 10*3/uL (ref 0–0.1)
BASOS PCT: 1 %
EOS ABS: 0 10*3/uL (ref 0–0.7)
EOS PCT: 0 %
HCT: 24.5 % — ABNORMAL LOW (ref 35.0–47.0)
Hemoglobin: 8.3 g/dL — ABNORMAL LOW (ref 12.0–16.0)
Lymphocytes Relative: 46 %
Lymphs Abs: 1.2 10*3/uL (ref 1.0–3.6)
MCH: 34.6 pg — ABNORMAL HIGH (ref 26.0–34.0)
MCHC: 33.8 g/dL (ref 32.0–36.0)
MCV: 102.4 fL — AB (ref 80.0–100.0)
MONO ABS: 0.1 10*3/uL — AB (ref 0.2–0.9)
Monocytes Relative: 3 %
Neutro Abs: 1.3 10*3/uL — ABNORMAL LOW (ref 1.4–6.5)
Neutrophils Relative %: 50 %
PLATELETS: 44 10*3/uL — AB (ref 150–440)
RBC: 2.4 MIL/uL — AB (ref 3.80–5.20)
RDW: 18.8 % — AB (ref 11.5–14.5)
WBC: 2.7 10*3/uL — AB (ref 3.6–11.0)

## 2016-08-19 LAB — SAMPLE TO BLOOD BANK

## 2016-08-19 NOTE — Progress Notes (Signed)
[name] NOTE  Patient Care Team: Roselee Nova, [name]  CHIEF COMPLAINTS/PURPOSE OF CONSULTATION:   Oncology History   #JUNE 2017- Severe neutropenia/ Anemia ~hb 9/platlets- 85-100 AUG 2017- REFRACTORY ANEMIA with EXCESS BLASTS [14% blasts- BMBx]; cytogenetics/FISH-N [SNP micorarray- not done]   # AUG 21st-  START AZA '75mg'$ /m2 day- 1-7 q 28 days x4 cycles; DEC 6th- BMBx- <5% blasts; hypercellular with dysplasia.   # CKD stage III     MDS (myelodysplastic syndrome), high grade ([place])   04/23/2016 Initial Diagnosis    MDS (myelodysplastic syndrome), high grade (HCC)         HISTORY OF PRESENTING ILLNESS:  Teresa Carrillo 75 y.o.  female with Recently diagnosed MDS/high-grade currently started on Vidaza Status post cycle #4 approximately 4 weeks ago is here for follow-up. Patient interim also had a bone marrow biopsy for restaging.  Patient has not had any recent blood transfusion or platelet transfusion. Patient had a recent follow-up at [facility] hematology/Dr.Foster.    She continues to have easy bruising. Otherwise no bleeding. No fevers. No chills. Appetite is good. No weight loss.   Patient denies any nausea vomiting.  Denies any fevers. Denies any chills. Continues to chronic fatigue. Not any worse.  ROS: A c is done which is negative except mentioned above in history of present illness  MEDICAL HISTORY:  Past Medical History:  Diagnosis Date  . Abdominal wall mass   . Anginal pain ([place])   . Anxiety   . Arthritis   . Calculus of kidney   . Cystitis   . Depression   . Diabetes mellitus without complication (HCC)    elevated A1c  . Dyspnea on exertion   . Elevated serum creatinine   . Fibrocystic breast disease   . GERD (gastroesophageal reflux disease)   . Hearing loss   . Heart murmur   . HTN (hypertension)   . Hyperlipidemia   . MDS (myelodysplastic syndrome), high grade ([place]) 04/23/2016  . Microscopic  hematuria   . Mucositis due to chemotherapy   . Obesity   . Risk for falls   . Sleep apnea   . Thrombocytopenia ([place])   . Urinary frequency   . Urinary urgency     SURGICAL HISTORY: Past Surgical History:  Procedure Laterality Date  . ABDOMINAL HYSTERECTOMY    . APPENDECTOMY    . CARDIAC CATHETERIZATION     x2  . CHOLECYSTECTOMY    . COLONOSCOPY N/A 02/24/2015   Procedure: COLONOSCOPY;  Surgeon: Manya Silvas, MD;  Location: [facility] ENDOSCOPY;  Service: Endoscopy;  Laterality: N/A;  . DIAGNOSTIC LAPAROSCOPY     Removal of benign abdominal tumor  . ESOPHAGOGASTRODUODENOSCOPY N/A 02/24/2015   Procedure: ESOPHAGOGASTRODUODENOSCOPY (EGD);  Surgeon: Manya Silvas, MD;  Location: [facility] ENDOSCOPY;  Service: Endoscopy;  Laterality: N/A;  . right eye surgery Right     SOCIAL HISTORY: lives with family; snow[employer]; [employer] in [place] retd. No smoking/ no alcohol.  Social History   Social History  . Marital status: Married    Spouse name: N/A  . Number of children: N/A  . Years of education: N/A   Occupational History  . Not on file.   Social History Main Topics  . Smoking status: Former Smoker    Quit date: 09/09/1988  . Smokeless tobacco: Never Used     Comment: quit 25 years ago  . Alcohol use No  . Drug use: No  .  Sexual activity: Not on file   Other Topics Concern  . Not on file   Social History Narrative  . No narrative on file    FAMILY HISTORY: no cancers in family.  Family History  Problem Relation Age of Onset  . Congestive Heart Failure Mother   . Diabetes Mother   . Coronary artery disease Mother   . Stroke Mother   . Cirrhosis Father     ALLERGIES:  is allergic to macrobid [nitrofurantoin monohyd macro].  MEDICATIONS:  Current Outpatient Prescriptions  Medication Sig Dispense Refill  . acyclovir (ZOVIRAX) 400 MG tablet Take 1 tablet (400 mg total) by mouth 2 (two) times daily. 120 tablet 2  . buPROPion (WELLBUTRIN XL) 150 MG 24 hr tablet Take 300  mg by mouth daily at 12 noon.     . clindamycin (CLEOCIN) 300 MG capsule Take 1 capsule (300 mg total) by mouth 3 (three) times daily. 30 capsule 0  . clonazePAM (KLONOPIN) 0.5 MG tablet Take 0.5 mg by mouth 2 (two) times daily.     . hydrocortisone (ANUSOL-HC) 2.5 % rectal cream Place 1 application rectally 2 (two) times daily as needed for hemorrhoids or itching. 30 g 0  . LUMIGAN 0.01 % SOLN Place 1 drop into both eyes at bedtime.     . metFORMIN (GLUCOPHAGE) 500 MG tablet TAKE 1 TABLET BY MOUTH DAILY 90 tablet 0  . Multiple Vitamin (MULTIVITAMIN WITH MINERALS) TABS tablet Take 1 tablet by mouth daily.    . Omega-3 Fatty Acids (FISH OIL) 1000 MG CAPS Take 1 capsule by mouth daily.    . ondansetron (ZOFRAN-ODT) 4 MG disintegrating tablet Take 4 mg by mouth every 8 (eight) hours as needed for nausea or vomiting.    . polyethylene glycol (MIRALAX / GLYCOLAX) packet Take 17 g by mouth daily as needed for mild constipation. 14 each 1  . prochlorperazine (COMPAZINE) 10 MG tablet Take 1 tablet (10 mg total) by mouth every 6 (six) hours as needed (Nausea or vomiting). 30 tablet 1  . rosuvastatin (CRESTOR) 20 MG tablet Take 1 tablet (20 mg total) by mouth daily. 90 tablet 1  . sertraline (ZOLOFT) 100 MG tablet Take 100 mg by mouth daily at 12 noon.     Marland Kitchen Umeclidinium-Vilanterol (ANORO ELLIPTA) 62.5-25 MCG/INH AEPB Inhale 1 puff into the lungs daily as needed (shortness of breath).      No current facility-administered medications for this visit.       Marland Kitchen  PHYSICAL EXAMINATION: ECOG PERFORMANCE STATUS: 1 - Symptomatic but completely ambulatory  Vitals:   08/19/16 1008  BP: 134/71  Pulse: 64  Temp: 97 F (36.1 C)   Filed Weights   08/19/16 1008  Weight: 233 lb 6 oz (105.9 kg)    GENERAL: Well-nourished well-developed; Alert, no distress and comfortable.   Obese. Accompanied by her sister-in-law/husband. She is walking herself.  EYES: Positive for pallor. OROPHARYNX: no thrush or  ulceration;  NECK: supple, no masses felt LYMPH:  no palpable lymphadenopathy in the cervical, axillary or inguinal regions LUNGS: clear to auscultation and  No wheeze or crackles HEART/CVS: regular rate & rhythm and no murmurs; No lower extremity edema ABDOMEN: abdomen soft, non-tender and normal bowel sounds Musculoskeletal:no cyanosis of digits and no clubbing  PSYCH: alert & oriented x 3 with fluent speech NEURO: no focal motor/sensory deficits SKIN:  ablation noted on the right leg /improving no signs of infection.   LABORATORY DATA:  I have reviewed the data as listed  Lab Results  Component Value Date   WBC 2.7 (L) 08/19/2016   HGB 8.3 (L) 08/19/2016   HCT 24.5 (L) 08/19/2016   MCV 102.4 (H) 08/19/2016   PLT 44 (L) 08/19/2016    Recent Labs  05/07/16 1652  06/19/16 1048  06/24/16 0936  07/22/16 1017 07/29/16 1033 08/19/16 0930  NA  --   < > 141  < > 142  < > 138 140 138  K  --   < > 3.9  < > 4.5  < > 4.6 4.9 4.4  CL  --   < > 106  < > 107  < > 105 104 104  CO2  --   < > 26  < > 27  < > _0 GLUCOSE  --   < > 119*  < > 127*  < > 126* 131* 111*  BUN  --   < > 14  < > 28*  < > 23* 27* 18  CREATININE  --   < > 0.91  < > 1.01*  < > 1.14* 1.42* 1.12*  CALCIUM  --   < > 9.5  < > 9.5  < > 9.1 9.2 9.2  GFRNONAA  --   < > >60  < > 53*  < > 46* 35* 47*  GFRAA  --   < > >60  < > >60  < > 54* 41* 54*  PROT 8.3*  < > 7.9  --  7.4  --  7.4  --   --   ALBUMIN 3.8  < > 4.1  --  3.9  --  4.0  --   --   AST 26  < > 29  --  21  --  24  --   --   ALT 25  < > 29  --  21  --  31  --   --   ALKPHOS 65  < > 85  --  70  --  76  --   --   BILITOT 0.6  < > 0.4  --  0.3  --  0.5  --   --   BILIDIR 0.1  --   --   --   --   --   --   --   --   IBILI 0.5  --   --   --   --   --   --   --   --   < > = values in this interval not displayed.  RADIOGRAPHIC STUDIES: I have personally reviewed the radiological images as listed and agreed with the findings in the report. Dg Chest 2  View  Result Date: 07/29/2016 CLINICAL DATA:  Dry cough, sneezing for 4 days.  COPD. EXAM: CHEST  2 VIEW COMPARISON:  02/19/2016 FINDINGS: Heart and mediastinal contours are within normal limits. No focal opacities or effusions. No acute bony abnormality. IMPRESSION: No active cardiopulmonary disease. Electronically Signed   By: Rolm Baptise M.D.   On: 07/29/2016 13:38   Ct Biopsy  Result Date: 08/14/2016 INDICATION: Myelodysplastic syndrome. EXAM: CT BIOPSY MEDICATIONS: None. ANESTHESIA/SEDATION: Moderate (conscious) sedation was employed during this procedure. A total of Versed 2.0 mg and Fentanyl 100 mcg was administered intravenously. Moderate Sedation Time: 26 minutes. The patient's level of consciousness and vital signs were monitored continuously by radiology nursing throughout the procedure under my direct supervision. COMPLICATIONS: None immediate. PROCEDURE: Informed written consent was obtained from the patient after a  thorough discussion of the procedural risks, benefits and alternatives. All questions were addressed. Sterile Barrier Technique was utilized including mask, sterile gloves, sterile drape, hand hygiene and skin antiseptic. A timeout was performed prior to the initiation of the procedure. Patient was placed prone on CT scanner. Posterior flank region was prepped and draped using sterile technique. Local anesthetic was applied. Under CT guidance, 22 gauge spinal needle was directed to posterior margin of right iliac bone. Lidocaine was administered subperiosteally. Needle was removed. Then, also under CT guidance, trocar was directed through posterior margin of right iliac bone. Bone marrow aspirates were obtained with and without heparin, and given to the pathology technologist who was present at that time. Then, bone marrow core biopsy was obtained, which was also given to the pathology technologist. Needle was removed and appropriate dressing was applied. IMPRESSION: Under CT  guidance, successful bone marrow aspiration and core biopsy of right iliac bone was performed. Electronically Signed   By: Marijo Conception, M.D.   On: 08/14/2016 10:17    ASSESSMENT & PLAN:   MDS (myelodysplastic syndrome), high grade (HCC) Refractory anemia- with excess blasts- II [blasts percentage 14%] FISH- normal karyotype. On vidaza; s/p  cycle # 4 BMX- Partial response noted < 5% blasts noted; continued hyper-cellular bone marrow; dysplastic changes. Today Hemoglobin 8.3 platelets 46. ANC 1.2. Given the response to treatment patient is not a candidate for any second line clinical trials at this time at [facility]. Discussed with Dr. Royce Macadamia. Appreciate his input.  # HOLD # 5 today [thrombocytopebia ~43]- question pancytopenia secondary to Vidaza; less likely from worsening malignancy as bone marrow is improved. plan re-starting treatment next week.   # Anemia [8.3] and thrombocytopenia- [platelets 43]-possibly secondary to [place]. Recommend holding treatment this week. We'll reevaluate next week for starting treatment again   # prophylactic antibiotics- acyclovir/ diflucan.    # follow up with cycle #6 in 4 weeks [jan 15th]/ treatment day-1-7.      Cammie Sickle, MD 08/19/2016 3:19 PM

## 2016-08-19 NOTE — Progress Notes (Signed)
Patient here today for follow up on MDS (myelodysplastic syndrome).  Patient concerned about results of bone marrow results

## 2016-08-19 NOTE — Assessment & Plan Note (Addendum)
Refractory anemia- with excess blasts- II [blasts percentage 14%] FISH- normal karyotype. On vidaza; s/p  cycle # 4 BMX- Partial response noted < 5% blasts noted; continued hyper-cellular bone marrow; dysplastic changes. Today Hemoglobin 8.3 platelets 46. ANC 1.2. Given the response to treatment patient is not a candidate for any second line clinical trials at this time at [facility]. Discussed with Dr. Royce Macadamia. Appreciate his input.  # HOLD # 5 today [thrombocytopebia ~43]- question pancytopenia secondary to Vidaza; less likely from worsening malignancy as bone marrow is improved. plan re-starting treatment next week.   # Anemia [8.3] and thrombocytopenia- [platelets 43]-possibly secondary to [place]. Recommend holding treatment this week. We'll reevaluate next week for starting treatment again   # prophylactic antibiotics- acyclovir/ diflucan.    # follow up with cycle #6 in 4 weeks [jan 15th]/ treatment day-1-7.

## 2016-08-20 ENCOUNTER — Inpatient Hospital Stay: Payer: Medicare Other

## 2016-08-21 ENCOUNTER — Inpatient Hospital Stay: Payer: Medicare Other

## 2016-08-22 ENCOUNTER — Other Ambulatory Visit: Payer: Self-pay | Admitting: Family Medicine

## 2016-08-22 ENCOUNTER — Inpatient Hospital Stay: Payer: Medicare Other

## 2016-08-22 DIAGNOSIS — E785 Hyperlipidemia, unspecified: Secondary | ICD-10-CM

## 2016-08-23 ENCOUNTER — Inpatient Hospital Stay: Payer: Medicare Other

## 2016-08-23 ENCOUNTER — Other Ambulatory Visit: Payer: Self-pay | Admitting: Family Medicine

## 2016-08-26 ENCOUNTER — Inpatient Hospital Stay: Payer: Medicare Other

## 2016-08-26 DIAGNOSIS — D46Z Other myelodysplastic syndromes: Secondary | ICD-10-CM

## 2016-08-26 DIAGNOSIS — Z87891 Personal history of nicotine dependence: Secondary | ICD-10-CM | POA: Diagnosis not present

## 2016-08-26 DIAGNOSIS — R233 Spontaneous ecchymoses: Secondary | ICD-10-CM | POA: Diagnosis not present

## 2016-08-26 DIAGNOSIS — Z79899 Other long term (current) drug therapy: Secondary | ICD-10-CM | POA: Diagnosis not present

## 2016-08-26 DIAGNOSIS — D61818 Other pancytopenia: Secondary | ICD-10-CM | POA: Diagnosis not present

## 2016-08-26 DIAGNOSIS — K219 Gastro-esophageal reflux disease without esophagitis: Secondary | ICD-10-CM | POA: Diagnosis not present

## 2016-08-26 DIAGNOSIS — Z87442 Personal history of urinary calculi: Secondary | ICD-10-CM | POA: Diagnosis not present

## 2016-08-26 DIAGNOSIS — N183 Chronic kidney disease, stage 3 (moderate): Secondary | ICD-10-CM | POA: Diagnosis not present

## 2016-08-26 DIAGNOSIS — R5382 Chronic fatigue, unspecified: Secondary | ICD-10-CM | POA: Diagnosis not present

## 2016-08-26 DIAGNOSIS — Z7901 Long term (current) use of anticoagulants: Secondary | ICD-10-CM | POA: Diagnosis not present

## 2016-08-26 DIAGNOSIS — E785 Hyperlipidemia, unspecified: Secondary | ICD-10-CM | POA: Diagnosis not present

## 2016-08-26 DIAGNOSIS — Z5111 Encounter for antineoplastic chemotherapy: Secondary | ICD-10-CM | POA: Diagnosis not present

## 2016-08-26 DIAGNOSIS — Z7984 Long term (current) use of oral hypoglycemic drugs: Secondary | ICD-10-CM | POA: Diagnosis not present

## 2016-08-26 DIAGNOSIS — E1122 Type 2 diabetes mellitus with diabetic chronic kidney disease: Secondary | ICD-10-CM | POA: Diagnosis not present

## 2016-08-26 DIAGNOSIS — D4622 Refractory anemia with excess of blasts 2: Secondary | ICD-10-CM | POA: Diagnosis not present

## 2016-08-26 DIAGNOSIS — I129 Hypertensive chronic kidney disease with stage 1 through stage 4 chronic kidney disease, or unspecified chronic kidney disease: Secondary | ICD-10-CM | POA: Diagnosis not present

## 2016-08-26 LAB — BASIC METABOLIC PANEL
ANION GAP: 8 (ref 5–15)
BUN: 16 mg/dL (ref 6–20)
CHLORIDE: 107 mmol/L (ref 101–111)
CO2: 25 mmol/L (ref 22–32)
CREATININE: 1.06 mg/dL — AB (ref 0.44–1.00)
Calcium: 9.2 mg/dL (ref 8.9–10.3)
GFR calc non Af Amer: 50 mL/min — ABNORMAL LOW (ref >60)
GFR, EST AFRICAN AMERICAN: 58 mL/min — AB (ref >60)
Glucose, Bld: 116 mg/dL — ABNORMAL HIGH (ref 65–99)
POTASSIUM: 4.5 mmol/L (ref 3.5–5.1)
SODIUM: 140 mmol/L (ref 135–145)

## 2016-08-26 LAB — CBC WITH DIFFERENTIAL/PLATELET
BASOS PCT: 1 %
Basophils Absolute: 0 10*3/uL (ref 0–0.1)
EOS ABS: 0 10*3/uL (ref 0–0.7)
Eosinophils Relative: 0 %
HEMATOCRIT: 23.4 % — AB (ref 35.0–47.0)
HEMOGLOBIN: 7.8 g/dL — AB (ref 12.0–16.0)
Lymphocytes Relative: 48 %
Lymphs Abs: 1 10*3/uL (ref 1.0–3.6)
MCH: 34.6 pg — ABNORMAL HIGH (ref 26.0–34.0)
MCHC: 33.5 g/dL (ref 32.0–36.0)
MCV: 103.4 fL — ABNORMAL HIGH (ref 80.0–100.0)
MONOS PCT: 5 %
Monocytes Absolute: 0.1 10*3/uL — ABNORMAL LOW (ref 0.2–0.9)
NEUTROS ABS: 1 10*3/uL — AB (ref 1.4–6.5)
NEUTROS PCT: 46 %
Platelets: 81 10*3/uL — ABNORMAL LOW (ref 150–440)
RBC: 2.26 MIL/uL — AB (ref 3.80–5.20)
RDW: 18.8 % — ABNORMAL HIGH (ref 11.5–14.5)
WBC: 2.1 10*3/uL — AB (ref 3.6–11.0)

## 2016-08-26 LAB — SAMPLE TO BLOOD BANK

## 2016-08-27 ENCOUNTER — Inpatient Hospital Stay: Payer: Medicare Other

## 2016-08-27 ENCOUNTER — Other Ambulatory Visit: Payer: Self-pay | Admitting: Family Medicine

## 2016-08-27 DIAGNOSIS — E785 Hyperlipidemia, unspecified: Secondary | ICD-10-CM

## 2016-08-28 ENCOUNTER — Inpatient Hospital Stay: Payer: Medicare Other

## 2016-08-29 ENCOUNTER — Inpatient Hospital Stay: Payer: Medicare Other

## 2016-08-29 NOTE — Telephone Encounter (Signed)
Medication has been refilled and sent to [facility]

## 2016-08-30 ENCOUNTER — Inpatient Hospital Stay: Payer: Medicare Other

## 2016-09-03 ENCOUNTER — Inpatient Hospital Stay (HOSPITAL_BASED_OUTPATIENT_CLINIC_OR_DEPARTMENT_OTHER): Payer: Medicare Other | Admitting: Oncology

## 2016-09-03 ENCOUNTER — Other Ambulatory Visit: Payer: Self-pay

## 2016-09-03 ENCOUNTER — Inpatient Hospital Stay: Payer: Medicare Other

## 2016-09-03 ENCOUNTER — Encounter: Payer: Self-pay | Admitting: Oncology

## 2016-09-03 VITALS — BP 131/62 | HR 70 | Temp 96.4°F | Wt 239.6 lb

## 2016-09-03 DIAGNOSIS — Z87442 Personal history of urinary calculi: Secondary | ICD-10-CM

## 2016-09-03 DIAGNOSIS — Z7984 Long term (current) use of oral hypoglycemic drugs: Secondary | ICD-10-CM | POA: Diagnosis not present

## 2016-09-03 DIAGNOSIS — D46Z Other myelodysplastic syndromes: Secondary | ICD-10-CM

## 2016-09-03 DIAGNOSIS — D4622 Refractory anemia with excess of blasts 2: Secondary | ICD-10-CM

## 2016-09-03 DIAGNOSIS — K219 Gastro-esophageal reflux disease without esophagitis: Secondary | ICD-10-CM | POA: Diagnosis not present

## 2016-09-03 DIAGNOSIS — Z7901 Long term (current) use of anticoagulants: Secondary | ICD-10-CM | POA: Diagnosis not present

## 2016-09-03 DIAGNOSIS — R5382 Chronic fatigue, unspecified: Secondary | ICD-10-CM | POA: Diagnosis not present

## 2016-09-03 DIAGNOSIS — R233 Spontaneous ecchymoses: Secondary | ICD-10-CM

## 2016-09-03 DIAGNOSIS — I129 Hypertensive chronic kidney disease with stage 1 through stage 4 chronic kidney disease, or unspecified chronic kidney disease: Secondary | ICD-10-CM | POA: Diagnosis not present

## 2016-09-03 DIAGNOSIS — E1122 Type 2 diabetes mellitus with diabetic chronic kidney disease: Secondary | ICD-10-CM

## 2016-09-03 DIAGNOSIS — D61818 Other pancytopenia: Secondary | ICD-10-CM | POA: Diagnosis not present

## 2016-09-03 DIAGNOSIS — N183 Chronic kidney disease, stage 3 (moderate): Secondary | ICD-10-CM | POA: Diagnosis not present

## 2016-09-03 DIAGNOSIS — E785 Hyperlipidemia, unspecified: Secondary | ICD-10-CM

## 2016-09-03 DIAGNOSIS — Z87891 Personal history of nicotine dependence: Secondary | ICD-10-CM | POA: Diagnosis not present

## 2016-09-03 DIAGNOSIS — Z5111 Encounter for antineoplastic chemotherapy: Secondary | ICD-10-CM | POA: Diagnosis not present

## 2016-09-03 DIAGNOSIS — Z79899 Other long term (current) drug therapy: Secondary | ICD-10-CM

## 2016-09-03 LAB — BASIC METABOLIC PANEL
ANION GAP: 5 (ref 5–15)
BUN: 22 mg/dL — ABNORMAL HIGH (ref 6–20)
CHLORIDE: 110 mmol/L (ref 101–111)
CO2: 26 mmol/L (ref 22–32)
Calcium: 9 mg/dL (ref 8.9–10.3)
Creatinine, Ser: 0.97 mg/dL (ref 0.44–1.00)
GFR calc Af Amer: >60 mL/min (ref >60)
GFR calc non Af Amer: 56 mL/min — ABNORMAL LOW (ref >60)
GLUCOSE: 126 mg/dL — AB (ref 65–99)
POTASSIUM: 4.5 mmol/L (ref 3.5–5.1)
Sodium: 141 mmol/L (ref 135–145)

## 2016-09-03 LAB — CBC WITH DIFFERENTIAL/PLATELET
BASOS ABS: 0 10*3/uL (ref 0–0.1)
Basophils Relative: 1 %
Eosinophils Absolute: 0 10*3/uL (ref 0–0.7)
Eosinophils Relative: 0 %
HEMATOCRIT: 26.2 % — AB (ref 35.0–47.0)
Hemoglobin: 8.7 g/dL — ABNORMAL LOW (ref 12.0–16.0)
LYMPHS PCT: 48 %
Lymphs Abs: 0.9 10*3/uL — ABNORMAL LOW (ref 1.0–3.6)
MCH: 34.1 pg — ABNORMAL HIGH (ref 26.0–34.0)
MCHC: 33 g/dL (ref 32.0–36.0)
MCV: 103.1 fL — AB (ref 80.0–100.0)
MONO ABS: 0.1 10*3/uL — AB (ref 0.2–0.9)
Monocytes Relative: 5 %
NEUTROS ABS: 0.9 10*3/uL — AB (ref 1.4–6.5)
Neutrophils Relative %: 46 %
Platelets: 149 10*3/uL — ABNORMAL LOW (ref 150–440)
RBC: 2.54 MIL/uL — AB (ref 3.80–5.20)
RDW: 18.5 % — AB (ref 11.5–14.5)
WBC: 2 10*3/uL — ABNORMAL LOW (ref 3.6–11.0)

## 2016-09-03 LAB — SAMPLE TO BLOOD BANK

## 2016-09-03 MED ORDER — AZACITIDINE CHEMO SQ INJECTION
75.0000 mg/m2 | Freq: Once | INTRAMUSCULAR | Status: AC
  Administered 2016-09-03: 165 mg via SUBCUTANEOUS
  Filled 2016-09-03: qty 6.6

## 2016-09-03 MED ORDER — ONDANSETRON HCL 4 MG PO TABS
8.0000 mg | ORAL_TABLET | Freq: Once | ORAL | Status: AC
  Administered 2016-09-03: 8 mg via ORAL
  Filled 2016-09-03: qty 2

## 2016-09-03 NOTE — Progress Notes (Signed)
Patient here today for follow up on MDS.  Patient has no new concerns today

## 2016-09-03 NOTE — Progress Notes (Signed)
Hematology/Oncology Consult note [facility]  Telephone:([phone] [phone]  Patient Care Team: Roselee Nova, [name]   Name of the patient: Teresa Carrillo  [id number]  12-Nov-1940   Date of visit: 09/03/16  Diagnosis- MDS- refractory anemia with excess blasts RAEB II  Chief complaint/ Reason for visit-   Heme/Onc history:  #JUNE 2017- Severe neutropenia/ Anemia ~hb 9/platlets- 85-100 AUG 2017- REFRACTORY ANEMIA with EXCESS BLASTS [14% blasts- BMBx]; cytogenetics/FISH-N [SNP micorarray- not done]   # AUG 21st-  START AZA 75mg /m2 day- 1-7 q 28 days x4 cycles; DEC 6th- BMBx- <5% blasts; hypercellular with dysplasia.   # CKD stage III  Patient received cycle 4 day 1 today 7 of widely is a from 07/22/2016 to 07/30/2016 and treatment has been on hold since due to pancytopenia which has been attributed likely to Vidaza  Interval history- overall she is doing well and denies any significant fatigue, fever or bleeding or bruising.  ECOG PS- 1 Review of systems- Review of Systems  Constitutional: Negative for chills, fever, malaise/fatigue and weight loss.  HENT: Negative for congestion, ear discharge and nosebleeds.   Eyes: Negative for blurred vision.  Respiratory: Negative for cough, hemoptysis, sputum production, shortness of breath and wheezing.   Cardiovascular: Negative for chest pain, palpitations, orthopnea and claudication.  Gastrointestinal: Negative for abdominal pain, blood in stool, constipation, diarrhea, heartburn, melena, nausea and vomiting.  Genitourinary: Negative for dysuria, flank pain, frequency, hematuria and urgency.  Musculoskeletal: Negative for back pain, joint pain and myalgias.  Skin: Negative for rash.  Neurological: Negative for dizziness, tingling, focal weakness, seizures, weakness and headaches.  Endo/Heme/Allergies: Does not bruise/bleed easily.  Psychiatric/Behavioral:  Negative for depression and suicidal ideas. The patient does not have insomnia.      Current treatment- monthly vidaza  Allergies  Allergen Reactions  . Macrobid [employer] Macro] Other (See Comments)    Reaction: unknown     Past Medical History:  Diagnosis Date  . Abdominal wall mass   . Anginal pain ([place])   . Anxiety   . Arthritis   . Calculus of kidney   . Cystitis   . Depression   . Diabetes mellitus without complication (HCC)    elevated A1c  . Dyspnea on exertion   . Elevated serum creatinine   . Fibrocystic breast disease   . GERD (gastroesophageal reflux disease)   . Hearing loss   . Heart murmur   . HTN (hypertension)   . Hyperlipidemia   . MDS (myelodysplastic syndrome), high grade ([place]) 04/23/2016  . Microscopic hematuria   . Mucositis due to chemotherapy   . Obesity   . Risk for falls   . Sleep apnea   . Thrombocytopenia ([place])   . Urinary frequency   . Urinary urgency      Past Surgical History:  Procedure Laterality Date  . ABDOMINAL HYSTERECTOMY    . APPENDECTOMY    . CARDIAC CATHETERIZATION     x2  . CHOLECYSTECTOMY    . COLONOSCOPY N/A 02/24/2015   Procedure: COLONOSCOPY;  Surgeon: Manya Silvas, MD;  Location: [facility] ENDOSCOPY;  Service: Endoscopy;  Laterality: N/A;  . DIAGNOSTIC LAPAROSCOPY     Removal of benign abdominal tumor  . ESOPHAGOGASTRODUODENOSCOPY N/A 02/24/2015   Procedure: ESOPHAGOGASTRODUODENOSCOPY (EGD);  Surgeon: Manya Silvas, MD;  Location: [facility] ENDOSCOPY;  Service: Endoscopy;  Laterality: N/A;  . right eye surgery Right     Social History  Social History  . Marital status: Married    Spouse name: N/A  . Number of children: N/A  . Years of education: N/A   Occupational History  . Not on file.   Social History Main Topics  . Smoking status: Former Smoker    Quit date: 09/09/1988  . Smokeless tobacco: Never Used     Comment: quit 25 years ago  . Alcohol use No  . Drug use: No  . Sexual  activity: Not on file   Other Topics Concern  . Not on file   Social History Narrative  . No narrative on file    Family History  Problem Relation Age of Onset  . Congestive Heart Failure Mother   . Diabetes Mother   . Coronary artery disease Mother   . Stroke Mother   . Cirrhosis Father      Current Outpatient Prescriptions:  .  acyclovir (ZOVIRAX) 400 MG tablet, Take 1 tablet (400 mg total) by mouth 2 (two) times daily., Disp: 120 tablet, Rfl: 2 .  buPROPion (WELLBUTRIN XL) 150 MG 24 hr tablet, Take 300 mg by mouth daily at 12 noon. , Disp: , Rfl:  .  clindamycin (CLEOCIN) 300 MG capsule, Take 1 capsule (300 mg total) by mouth 3 (three) times daily., Disp: 30 capsule, Rfl: 0 .  clonazePAM (KLONOPIN) 0.5 MG tablet, Take 0.5 mg by mouth 2 (two) times daily. , Disp: , Rfl:  .  hydrocortisone (ANUSOL-HC) 2.5 % rectal cream, Place 1 application rectally 2 (two) times daily as needed for hemorrhoids or itching., Disp: 30 g, Rfl: 0 .  LUMIGAN 0.01 % SOLN, Place 1 drop into both eyes at bedtime. , Disp: , Rfl:  .  metFORMIN (GLUCOPHAGE) 500 MG tablet, TAKE 1 TABLET BY MOUTH DAILY, Disp: 90 tablet, Rfl: 0 .  Multiple Vitamin (MULTIVITAMIN WITH MINERALS) TABS tablet, Take 1 tablet by mouth daily., Disp: , Rfl:  .  Omega-3 Fatty Acids (FISH OIL) 1000 MG CAPS, Take 1 capsule by mouth daily., Disp: , Rfl:  .  ondansetron (ZOFRAN-ODT) 4 MG disintegrating tablet, Take 4 mg by mouth every 8 (eight) hours as needed for nausea or vomiting., Disp: , Rfl:  .  polyethylene glycol (MIRALAX / GLYCOLAX) packet, Take 17 g by mouth daily as needed for mild constipation., Disp: 14 each, Rfl: 1 .  prochlorperazine (COMPAZINE) 10 MG tablet, Take 1 tablet (10 mg total) by mouth every 6 (six) hours as needed (Nausea or vomiting)., Disp: 30 tablet, Rfl: 1 .  rosuvastatin (CRESTOR) 20 MG tablet, TAKE 1 TABLET(20 MG) BY MOUTH DAILY, Disp: 90 tablet, Rfl: 0 .  sertraline (ZOLOFT) 100 MG tablet, Take 100 mg by mouth  daily at 12 noon. , Disp: , Rfl:  .  Umeclidinium-Vilanterol (ANORO ELLIPTA) 62.5-25 MCG/INH AEPB, Inhale 1 puff into the lungs daily as needed (shortness of breath). , Disp: , Rfl:   Physical exam:  Vitals:   09/03/16 1414  BP: 131/62  Pulse: 70  Temp: (!) 96.4 F (35.8 C)  TempSrc: Tympanic  Weight: 239 lb 10.2 oz (108.7 kg)   Physical Exam  Constitutional: She is oriented to person, place, and time and well-developed, well-nourished, and in no distress.  She is obese and sitting on a wheelchair.  HENT:  Head: Normocephalic and atraumatic.  Eyes: EOM are normal. Pupils are equal, round, and reactive to light.  Neck: Normal range of motion.  Cardiovascular: Normal rate, regular rhythm and normal heart sounds.   Pulmonary/Chest: Effort normal  and breath sounds normal.  Abdominal: Soft. Bowel sounds are normal.  Neurological: She is alert and oriented to person, place, and time.  Skin: Skin is warm and dry.     CMP Latest Ref Rng & Units 08/26/2016  Glucose 65 - 99 mg/dL 116(H)  BUN 6 - 20 mg/dL 16  Creatinine 0.44 - 1.00 mg/dL 1.06(H)  Sodium 135 - 145 mmol/L 140  Potassium 3.5 - 5.1 mmol/L 4.5  Chloride 101 - 111 mmol/L 107  CO2 22 - 32 mmol/L 25  Calcium 8.9 - 10.3 mg/dL 9.2  Total Protein 6.5 - 8.1 g/dL -  Total Bilirubin 0.3 - 1.2 mg/dL -  Alkaline Phos 38 - 126 U/L -  AST 15 - 41 U/L -  ALT 14 - 54 U/L -   CBC Latest Ref Rng & Units 09/03/2016  WBC 3.6 - 11.0 K/uL 2.0(L)  Hemoglobin 12.0 - 16.0 g/dL 8.7(L)  Hematocrit 35.0 - 47.0 % 26.2(L)  Platelets 150 - 440 K/uL 149(L)    No images are attached to the encounter.  Ct Biopsy  Result Date: 08/14/2016 INDICATION: Myelodysplastic syndrome. EXAM: CT BIOPSY MEDICATIONS: None. ANESTHESIA/SEDATION: Moderate (conscious) sedation was employed during this procedure. A total of Versed 2.0 mg and Fentanyl 100 mcg was administered intravenously. Moderate Sedation Time: 26 minutes. The patient's level of consciousness and  vital signs were monitored continuously by radiology nursing throughout the procedure under my direct supervision. COMPLICATIONS: None immediate. PROCEDURE: Informed written consent was obtained from the patient after a thorough discussion of the procedural risks, benefits and alternatives. All questions were addressed. Sterile Barrier Technique was utilized including mask, sterile gloves, sterile drape, hand hygiene and skin antiseptic. A timeout was performed prior to the initiation of the procedure. Patient was placed prone on CT scanner. Posterior flank region was prepped and draped using sterile technique. Local anesthetic was applied. Under CT guidance, 22 gauge spinal needle was directed to posterior margin of right iliac bone. Lidocaine was administered subperiosteally. Needle was removed. Then, also under CT guidance, trocar was directed through posterior margin of right iliac bone. Bone marrow aspirates were obtained with and without heparin, and given to the pathology technologist who was present at that time. Then, bone marrow core biopsy was obtained, which was also given to the pathology technologist. Needle was removed and appropriate dressing was applied. IMPRESSION: Under CT guidance, successful bone marrow aspiration and core biopsy of right iliac bone was performed. Electronically Signed   By: Marijo Conception, M.D.   On: 08/14/2016 10:17     Assessment and plan- Patient is a 75 y.o. female with a history of MDS- RAEB II currently on monthly Vidaza last treatment was on 07/30/2016 and treatment has been on hold since then given pancytopenia  1. I reviewed the results of the blood work with the patient and her family. I will also discuss this patient's case with Dr. Rogue Bussing who is her primary hematologist oncologist. Her platelet counts have improved to 149 today however her white count remains low at 2.0 with an [place] of 0.9 and hemoglobin of 8.7 which has more or less remained unchanged  over the last couple of months. It is difficult to say if it is wide-based induced pancytopenia worsens her underlying MDS which is causing the low white count and the anemia. Hence at this point we will restart her Vidaza as cycle #5 today and for the next 7 days to see if her white count improves if it is underlying  tumor secondary MDS. We will also recheck her CBC next week after 4 doses of Vidaza. She knows to call our office if she were to develop any infectious symptoms including all but not limited to fever more than 38.1, cough sputum production or burning micturition. She will be seen back in 4 weeks' time with a repeat CBC and CMP prior to cycle #5 of [place] with Dr. Rogue Bussing   Visit Diagnosis 1. MDS (myelodysplastic syndrome), high grade (HCC)      Dr. Randa Evens, MD, MPH [facility] Pager- [phone] 09/03/2016 1:24 PM

## 2016-09-03 NOTE — Progress Notes (Signed)
ANC: 900. MD, Dr. Janese Banks, notified and already aware. Per MD order: proceed with Vidaza treatment today.

## 2016-09-04 ENCOUNTER — Other Ambulatory Visit: Payer: Medicare Other

## 2016-09-04 ENCOUNTER — Inpatient Hospital Stay: Payer: Medicare Other

## 2016-09-04 ENCOUNTER — Ambulatory Visit: Payer: Medicare Other

## 2016-09-04 VITALS — BP 123/70 | HR 83 | Temp 96.6°F | Resp 18

## 2016-09-04 DIAGNOSIS — Z7984 Long term (current) use of oral hypoglycemic drugs: Secondary | ICD-10-CM | POA: Diagnosis not present

## 2016-09-04 DIAGNOSIS — E1122 Type 2 diabetes mellitus with diabetic chronic kidney disease: Secondary | ICD-10-CM | POA: Diagnosis not present

## 2016-09-04 DIAGNOSIS — Z87891 Personal history of nicotine dependence: Secondary | ICD-10-CM | POA: Diagnosis not present

## 2016-09-04 DIAGNOSIS — Z7901 Long term (current) use of anticoagulants: Secondary | ICD-10-CM | POA: Diagnosis not present

## 2016-09-04 DIAGNOSIS — D61818 Other pancytopenia: Secondary | ICD-10-CM | POA: Diagnosis not present

## 2016-09-04 DIAGNOSIS — Z87442 Personal history of urinary calculi: Secondary | ICD-10-CM | POA: Diagnosis not present

## 2016-09-04 DIAGNOSIS — K219 Gastro-esophageal reflux disease without esophagitis: Secondary | ICD-10-CM | POA: Diagnosis not present

## 2016-09-04 DIAGNOSIS — E785 Hyperlipidemia, unspecified: Secondary | ICD-10-CM | POA: Diagnosis not present

## 2016-09-04 DIAGNOSIS — Z5111 Encounter for antineoplastic chemotherapy: Secondary | ICD-10-CM | POA: Diagnosis not present

## 2016-09-04 DIAGNOSIS — R5382 Chronic fatigue, unspecified: Secondary | ICD-10-CM | POA: Diagnosis not present

## 2016-09-04 DIAGNOSIS — I129 Hypertensive chronic kidney disease with stage 1 through stage 4 chronic kidney disease, or unspecified chronic kidney disease: Secondary | ICD-10-CM | POA: Diagnosis not present

## 2016-09-04 DIAGNOSIS — D4622 Refractory anemia with excess of blasts 2: Secondary | ICD-10-CM | POA: Diagnosis not present

## 2016-09-04 DIAGNOSIS — R233 Spontaneous ecchymoses: Secondary | ICD-10-CM | POA: Diagnosis not present

## 2016-09-04 DIAGNOSIS — Z79899 Other long term (current) drug therapy: Secondary | ICD-10-CM | POA: Diagnosis not present

## 2016-09-04 DIAGNOSIS — N183 Chronic kidney disease, stage 3 (moderate): Secondary | ICD-10-CM | POA: Diagnosis not present

## 2016-09-04 DIAGNOSIS — D46Z Other myelodysplastic syndromes: Secondary | ICD-10-CM

## 2016-09-04 MED ORDER — ONDANSETRON HCL 4 MG PO TABS
8.0000 mg | ORAL_TABLET | Freq: Once | ORAL | Status: AC
  Administered 2016-09-04: 8 mg via ORAL
  Filled 2016-09-04: qty 2

## 2016-09-04 MED ORDER — AZACITIDINE CHEMO SQ INJECTION
75.0000 mg/m2 | Freq: Once | INTRAMUSCULAR | Status: AC
  Administered 2016-09-04: 165 mg via SUBCUTANEOUS
  Filled 2016-09-04: qty 6.6

## 2016-09-05 ENCOUNTER — Inpatient Hospital Stay: Payer: Medicare Other

## 2016-09-05 VITALS — BP 125/72 | HR 93 | Temp 97.3°F | Resp 18

## 2016-09-05 DIAGNOSIS — Z7984 Long term (current) use of oral hypoglycemic drugs: Secondary | ICD-10-CM | POA: Diagnosis not present

## 2016-09-05 DIAGNOSIS — Z79899 Other long term (current) drug therapy: Secondary | ICD-10-CM | POA: Diagnosis not present

## 2016-09-05 DIAGNOSIS — N183 Chronic kidney disease, stage 3 (moderate): Secondary | ICD-10-CM | POA: Diagnosis not present

## 2016-09-05 DIAGNOSIS — R233 Spontaneous ecchymoses: Secondary | ICD-10-CM | POA: Diagnosis not present

## 2016-09-05 DIAGNOSIS — E785 Hyperlipidemia, unspecified: Secondary | ICD-10-CM | POA: Diagnosis not present

## 2016-09-05 DIAGNOSIS — K219 Gastro-esophageal reflux disease without esophagitis: Secondary | ICD-10-CM | POA: Diagnosis not present

## 2016-09-05 DIAGNOSIS — D46Z Other myelodysplastic syndromes: Secondary | ICD-10-CM

## 2016-09-05 DIAGNOSIS — E1122 Type 2 diabetes mellitus with diabetic chronic kidney disease: Secondary | ICD-10-CM | POA: Diagnosis not present

## 2016-09-05 DIAGNOSIS — R5382 Chronic fatigue, unspecified: Secondary | ICD-10-CM | POA: Diagnosis not present

## 2016-09-05 DIAGNOSIS — D4622 Refractory anemia with excess of blasts 2: Secondary | ICD-10-CM | POA: Diagnosis not present

## 2016-09-05 DIAGNOSIS — Z7901 Long term (current) use of anticoagulants: Secondary | ICD-10-CM | POA: Diagnosis not present

## 2016-09-05 DIAGNOSIS — Z87891 Personal history of nicotine dependence: Secondary | ICD-10-CM | POA: Diagnosis not present

## 2016-09-05 DIAGNOSIS — Z5111 Encounter for antineoplastic chemotherapy: Secondary | ICD-10-CM | POA: Diagnosis not present

## 2016-09-05 DIAGNOSIS — Z87442 Personal history of urinary calculi: Secondary | ICD-10-CM | POA: Diagnosis not present

## 2016-09-05 DIAGNOSIS — D61818 Other pancytopenia: Secondary | ICD-10-CM | POA: Diagnosis not present

## 2016-09-05 DIAGNOSIS — I129 Hypertensive chronic kidney disease with stage 1 through stage 4 chronic kidney disease, or unspecified chronic kidney disease: Secondary | ICD-10-CM | POA: Diagnosis not present

## 2016-09-05 MED ORDER — ONDANSETRON HCL 4 MG PO TABS
8.0000 mg | ORAL_TABLET | Freq: Once | ORAL | Status: AC
  Administered 2016-09-05: 8 mg via ORAL
  Filled 2016-09-05: qty 2

## 2016-09-05 MED ORDER — AZACITIDINE CHEMO SQ INJECTION
75.0000 mg/m2 | Freq: Once | INTRAMUSCULAR | Status: AC
  Administered 2016-09-05: 165 mg via SUBCUTANEOUS
  Filled 2016-09-05: qty 6.6

## 2016-09-06 ENCOUNTER — Inpatient Hospital Stay: Payer: Medicare Other

## 2016-09-06 VITALS — BP 134/79 | HR 80 | Temp 96.7°F | Resp 18

## 2016-09-06 DIAGNOSIS — Z5111 Encounter for antineoplastic chemotherapy: Secondary | ICD-10-CM | POA: Diagnosis not present

## 2016-09-06 DIAGNOSIS — N183 Chronic kidney disease, stage 3 (moderate): Secondary | ICD-10-CM | POA: Diagnosis not present

## 2016-09-06 DIAGNOSIS — D61818 Other pancytopenia: Secondary | ICD-10-CM | POA: Diagnosis not present

## 2016-09-06 DIAGNOSIS — K219 Gastro-esophageal reflux disease without esophagitis: Secondary | ICD-10-CM | POA: Diagnosis not present

## 2016-09-06 DIAGNOSIS — Z7901 Long term (current) use of anticoagulants: Secondary | ICD-10-CM | POA: Diagnosis not present

## 2016-09-06 DIAGNOSIS — Z7984 Long term (current) use of oral hypoglycemic drugs: Secondary | ICD-10-CM | POA: Diagnosis not present

## 2016-09-06 DIAGNOSIS — Z79899 Other long term (current) drug therapy: Secondary | ICD-10-CM | POA: Diagnosis not present

## 2016-09-06 DIAGNOSIS — Z87442 Personal history of urinary calculi: Secondary | ICD-10-CM | POA: Diagnosis not present

## 2016-09-06 DIAGNOSIS — E1122 Type 2 diabetes mellitus with diabetic chronic kidney disease: Secondary | ICD-10-CM | POA: Diagnosis not present

## 2016-09-06 DIAGNOSIS — D46Z Other myelodysplastic syndromes: Secondary | ICD-10-CM

## 2016-09-06 DIAGNOSIS — R233 Spontaneous ecchymoses: Secondary | ICD-10-CM | POA: Diagnosis not present

## 2016-09-06 DIAGNOSIS — E785 Hyperlipidemia, unspecified: Secondary | ICD-10-CM | POA: Diagnosis not present

## 2016-09-06 DIAGNOSIS — D4622 Refractory anemia with excess of blasts 2: Secondary | ICD-10-CM | POA: Diagnosis not present

## 2016-09-06 DIAGNOSIS — I129 Hypertensive chronic kidney disease with stage 1 through stage 4 chronic kidney disease, or unspecified chronic kidney disease: Secondary | ICD-10-CM | POA: Diagnosis not present

## 2016-09-06 DIAGNOSIS — R5382 Chronic fatigue, unspecified: Secondary | ICD-10-CM | POA: Diagnosis not present

## 2016-09-06 DIAGNOSIS — Z87891 Personal history of nicotine dependence: Secondary | ICD-10-CM | POA: Diagnosis not present

## 2016-09-06 MED ORDER — ONDANSETRON HCL 4 MG PO TABS
8.0000 mg | ORAL_TABLET | Freq: Once | ORAL | Status: AC
  Administered 2016-09-06: 8 mg via ORAL
  Filled 2016-09-06: qty 2

## 2016-09-06 MED ORDER — AZACITIDINE CHEMO SQ INJECTION
75.0000 mg/m2 | Freq: Once | INTRAMUSCULAR | Status: AC
  Administered 2016-09-06: 165 mg via SUBCUTANEOUS
  Filled 2016-09-06: qty 6.6

## 2016-09-10 ENCOUNTER — Inpatient Hospital Stay: Payer: Medicare Other | Attending: Internal Medicine

## 2016-09-10 ENCOUNTER — Inpatient Hospital Stay: Payer: Medicare Other

## 2016-09-10 VITALS — BP 137/79 | HR 72 | Temp 96.5°F | Resp 18

## 2016-09-10 DIAGNOSIS — D4622 Refractory anemia with excess of blasts 2: Secondary | ICD-10-CM | POA: Insufficient documentation

## 2016-09-10 DIAGNOSIS — Z79899 Other long term (current) drug therapy: Secondary | ICD-10-CM | POA: Diagnosis not present

## 2016-09-10 DIAGNOSIS — Z87442 Personal history of urinary calculi: Secondary | ICD-10-CM | POA: Insufficient documentation

## 2016-09-10 DIAGNOSIS — Z87891 Personal history of nicotine dependence: Secondary | ICD-10-CM | POA: Diagnosis not present

## 2016-09-10 DIAGNOSIS — E1122 Type 2 diabetes mellitus with diabetic chronic kidney disease: Secondary | ICD-10-CM | POA: Diagnosis not present

## 2016-09-10 DIAGNOSIS — I129 Hypertensive chronic kidney disease with stage 1 through stage 4 chronic kidney disease, or unspecified chronic kidney disease: Secondary | ICD-10-CM | POA: Insufficient documentation

## 2016-09-10 DIAGNOSIS — Z7901 Long term (current) use of anticoagulants: Secondary | ICD-10-CM | POA: Insufficient documentation

## 2016-09-10 DIAGNOSIS — D61818 Other pancytopenia: Secondary | ICD-10-CM | POA: Diagnosis not present

## 2016-09-10 DIAGNOSIS — R233 Spontaneous ecchymoses: Secondary | ICD-10-CM | POA: Diagnosis not present

## 2016-09-10 DIAGNOSIS — K219 Gastro-esophageal reflux disease without esophagitis: Secondary | ICD-10-CM | POA: Diagnosis not present

## 2016-09-10 DIAGNOSIS — D46Z Other myelodysplastic syndromes: Secondary | ICD-10-CM

## 2016-09-10 DIAGNOSIS — N183 Chronic kidney disease, stage 3 (moderate): Secondary | ICD-10-CM | POA: Insufficient documentation

## 2016-09-10 DIAGNOSIS — Z7984 Long term (current) use of oral hypoglycemic drugs: Secondary | ICD-10-CM | POA: Diagnosis not present

## 2016-09-10 DIAGNOSIS — Z792 Long term (current) use of antibiotics: Secondary | ICD-10-CM | POA: Insufficient documentation

## 2016-09-10 DIAGNOSIS — E785 Hyperlipidemia, unspecified: Secondary | ICD-10-CM | POA: Insufficient documentation

## 2016-09-10 LAB — CBC WITH DIFFERENTIAL/PLATELET
BASOS PCT: 1 %
Basophils Absolute: 0 10*3/uL (ref 0–0.1)
Eosinophils Absolute: 0 10*3/uL (ref 0–0.7)
Eosinophils Relative: 0 %
HEMATOCRIT: 25.5 % — AB (ref 35.0–47.0)
Hemoglobin: 8.6 g/dL — ABNORMAL LOW (ref 12.0–16.0)
LYMPHS ABS: 1 10*3/uL (ref 1.0–3.6)
Lymphocytes Relative: 50 %
MCH: 33.9 pg (ref 26.0–34.0)
MCHC: 33.6 g/dL (ref 32.0–36.0)
MCV: 100.8 fL — ABNORMAL HIGH (ref 80.0–100.0)
MONO ABS: 0.2 10*3/uL (ref 0.2–0.9)
MONOS PCT: 9 %
Neutro Abs: 0.8 10*3/uL — ABNORMAL LOW (ref 1.4–6.5)
Neutrophils Relative %: 40 %
Platelets: 78 10*3/uL — ABNORMAL LOW (ref 150–440)
RBC: 2.52 MIL/uL — ABNORMAL LOW (ref 3.80–5.20)
RDW: 17.6 % — AB (ref 11.5–14.5)
WBC: 2 10*3/uL — ABNORMAL LOW (ref 3.6–11.0)

## 2016-09-10 LAB — COMPREHENSIVE METABOLIC PANEL
ALBUMIN: 3.9 g/dL (ref 3.5–5.0)
ALK PHOS: 77 U/L (ref 38–126)
ALT: 23 U/L (ref 14–54)
ANION GAP: 8 (ref 5–15)
AST: 24 U/L (ref 15–41)
BUN: 22 mg/dL — ABNORMAL HIGH (ref 6–20)
CALCIUM: 9 mg/dL (ref 8.9–10.3)
CO2: 26 mmol/L (ref 22–32)
Chloride: 104 mmol/L (ref 101–111)
Creatinine, Ser: 0.99 mg/dL (ref 0.44–1.00)
GFR, EST NON AFRICAN AMERICAN: 54 mL/min — AB (ref >60)
Glucose, Bld: 167 mg/dL — ABNORMAL HIGH (ref 65–99)
POTASSIUM: 4.1 mmol/L (ref 3.5–5.1)
Sodium: 138 mmol/L (ref 135–145)
TOTAL PROTEIN: 7.4 g/dL (ref 6.5–8.1)
Total Bilirubin: 0.4 mg/dL (ref 0.3–1.2)

## 2016-09-10 LAB — SAMPLE TO BLOOD BANK

## 2016-09-10 MED ORDER — AZACITIDINE CHEMO SQ INJECTION
75.0000 mg/m2 | Freq: Once | INTRAMUSCULAR | Status: AC
  Administered 2016-09-10: 165 mg via SUBCUTANEOUS
  Filled 2016-09-10: qty 6.6

## 2016-09-10 MED ORDER — ONDANSETRON HCL 4 MG PO TABS
8.0000 mg | ORAL_TABLET | Freq: Once | ORAL | Status: AC
  Administered 2016-09-10: 8 mg via ORAL
  Filled 2016-09-10: qty 2

## 2016-09-11 ENCOUNTER — Inpatient Hospital Stay: Payer: Medicare Other

## 2016-09-11 VITALS — BP 131/74 | HR 84 | Temp 96.0°F | Resp 20

## 2016-09-11 DIAGNOSIS — E785 Hyperlipidemia, unspecified: Secondary | ICD-10-CM | POA: Diagnosis not present

## 2016-09-11 DIAGNOSIS — D61818 Other pancytopenia: Secondary | ICD-10-CM | POA: Diagnosis not present

## 2016-09-11 DIAGNOSIS — Z79899 Other long term (current) drug therapy: Secondary | ICD-10-CM | POA: Diagnosis not present

## 2016-09-11 DIAGNOSIS — E1122 Type 2 diabetes mellitus with diabetic chronic kidney disease: Secondary | ICD-10-CM | POA: Diagnosis not present

## 2016-09-11 DIAGNOSIS — N183 Chronic kidney disease, stage 3 (moderate): Secondary | ICD-10-CM | POA: Diagnosis not present

## 2016-09-11 DIAGNOSIS — Z792 Long term (current) use of antibiotics: Secondary | ICD-10-CM | POA: Diagnosis not present

## 2016-09-11 DIAGNOSIS — Z7984 Long term (current) use of oral hypoglycemic drugs: Secondary | ICD-10-CM | POA: Diagnosis not present

## 2016-09-11 DIAGNOSIS — Z7901 Long term (current) use of anticoagulants: Secondary | ICD-10-CM | POA: Diagnosis not present

## 2016-09-11 DIAGNOSIS — D4622 Refractory anemia with excess of blasts 2: Secondary | ICD-10-CM | POA: Diagnosis not present

## 2016-09-11 DIAGNOSIS — D46Z Other myelodysplastic syndromes: Secondary | ICD-10-CM

## 2016-09-11 DIAGNOSIS — Z87442 Personal history of urinary calculi: Secondary | ICD-10-CM | POA: Diagnosis not present

## 2016-09-11 DIAGNOSIS — K219 Gastro-esophageal reflux disease without esophagitis: Secondary | ICD-10-CM | POA: Diagnosis not present

## 2016-09-11 DIAGNOSIS — R233 Spontaneous ecchymoses: Secondary | ICD-10-CM | POA: Diagnosis not present

## 2016-09-11 DIAGNOSIS — Z87891 Personal history of nicotine dependence: Secondary | ICD-10-CM | POA: Diagnosis not present

## 2016-09-11 DIAGNOSIS — I129 Hypertensive chronic kidney disease with stage 1 through stage 4 chronic kidney disease, or unspecified chronic kidney disease: Secondary | ICD-10-CM | POA: Diagnosis not present

## 2016-09-11 MED ORDER — ONDANSETRON HCL 4 MG PO TABS
8.0000 mg | ORAL_TABLET | Freq: Once | ORAL | Status: AC
  Administered 2016-09-11: 8 mg via ORAL
  Filled 2016-09-11: qty 2

## 2016-09-11 MED ORDER — AZACITIDINE CHEMO SQ INJECTION
75.0000 mg/m2 | Freq: Once | INTRAMUSCULAR | Status: AC
  Administered 2016-09-11: 165 mg via SUBCUTANEOUS
  Filled 2016-09-11: qty 6.6

## 2016-09-12 ENCOUNTER — Inpatient Hospital Stay: Payer: Medicare Other

## 2016-09-13 ENCOUNTER — Inpatient Hospital Stay: Payer: Medicare Other

## 2016-09-13 VITALS — BP 128/76 | HR 82 | Temp 97.2°F | Resp 20

## 2016-09-13 DIAGNOSIS — K219 Gastro-esophageal reflux disease without esophagitis: Secondary | ICD-10-CM | POA: Diagnosis not present

## 2016-09-13 DIAGNOSIS — Z87442 Personal history of urinary calculi: Secondary | ICD-10-CM | POA: Diagnosis not present

## 2016-09-13 DIAGNOSIS — R233 Spontaneous ecchymoses: Secondary | ICD-10-CM | POA: Diagnosis not present

## 2016-09-13 DIAGNOSIS — Z79899 Other long term (current) drug therapy: Secondary | ICD-10-CM | POA: Diagnosis not present

## 2016-09-13 DIAGNOSIS — Z7984 Long term (current) use of oral hypoglycemic drugs: Secondary | ICD-10-CM | POA: Diagnosis not present

## 2016-09-13 DIAGNOSIS — Z7901 Long term (current) use of anticoagulants: Secondary | ICD-10-CM | POA: Diagnosis not present

## 2016-09-13 DIAGNOSIS — D46Z Other myelodysplastic syndromes: Secondary | ICD-10-CM

## 2016-09-13 DIAGNOSIS — I129 Hypertensive chronic kidney disease with stage 1 through stage 4 chronic kidney disease, or unspecified chronic kidney disease: Secondary | ICD-10-CM | POA: Diagnosis not present

## 2016-09-13 DIAGNOSIS — E1122 Type 2 diabetes mellitus with diabetic chronic kidney disease: Secondary | ICD-10-CM | POA: Diagnosis not present

## 2016-09-13 DIAGNOSIS — Z792 Long term (current) use of antibiotics: Secondary | ICD-10-CM | POA: Diagnosis not present

## 2016-09-13 DIAGNOSIS — D4622 Refractory anemia with excess of blasts 2: Secondary | ICD-10-CM | POA: Diagnosis not present

## 2016-09-13 DIAGNOSIS — N183 Chronic kidney disease, stage 3 (moderate): Secondary | ICD-10-CM | POA: Diagnosis not present

## 2016-09-13 DIAGNOSIS — E785 Hyperlipidemia, unspecified: Secondary | ICD-10-CM | POA: Diagnosis not present

## 2016-09-13 DIAGNOSIS — D61818 Other pancytopenia: Secondary | ICD-10-CM | POA: Diagnosis not present

## 2016-09-13 DIAGNOSIS — Z87891 Personal history of nicotine dependence: Secondary | ICD-10-CM | POA: Diagnosis not present

## 2016-09-13 MED ORDER — AZACITIDINE CHEMO SQ INJECTION
75.0000 mg/m2 | Freq: Once | INTRAMUSCULAR | Status: AC
  Administered 2016-09-13: 165 mg via SUBCUTANEOUS
  Filled 2016-09-13: qty 6.6

## 2016-09-13 MED ORDER — ONDANSETRON HCL 4 MG PO TABS
8.0000 mg | ORAL_TABLET | Freq: Once | ORAL | Status: AC
  Administered 2016-09-13: 8 mg via ORAL
  Filled 2016-09-13: qty 2

## 2016-09-21 ENCOUNTER — Other Ambulatory Visit: Payer: Self-pay | Admitting: Internal Medicine

## 2016-09-21 DIAGNOSIS — D46Z Other myelodysplastic syndromes: Secondary | ICD-10-CM

## 2016-09-23 ENCOUNTER — Ambulatory Visit: Payer: Medicare Other

## 2016-09-23 ENCOUNTER — Ambulatory Visit: Payer: Medicare Other | Admitting: Internal Medicine

## 2016-09-23 ENCOUNTER — Other Ambulatory Visit: Payer: Medicare Other

## 2016-09-23 NOTE — Telephone Encounter (Signed)
I have called and left message on VM to find out why she needs refill on her Flucoazole

## 2016-09-24 ENCOUNTER — Ambulatory Visit: Payer: Medicare Other

## 2016-09-25 ENCOUNTER — Ambulatory Visit: Payer: Medicare Other

## 2016-09-26 ENCOUNTER — Ambulatory Visit: Payer: Medicare Other

## 2016-09-27 ENCOUNTER — Ambulatory Visit: Payer: Medicare Other

## 2016-09-28 ENCOUNTER — Other Ambulatory Visit: Payer: Self-pay | Admitting: Family Medicine

## 2016-09-29 ENCOUNTER — Other Ambulatory Visit: Payer: Self-pay | Admitting: Family Medicine

## 2016-09-30 ENCOUNTER — Inpatient Hospital Stay: Payer: Medicare Other

## 2016-09-30 ENCOUNTER — Inpatient Hospital Stay (HOSPITAL_BASED_OUTPATIENT_CLINIC_OR_DEPARTMENT_OTHER): Payer: Medicare Other | Admitting: Internal Medicine

## 2016-09-30 ENCOUNTER — Other Ambulatory Visit: Payer: Medicare Other

## 2016-09-30 ENCOUNTER — Ambulatory Visit: Payer: Medicare Other

## 2016-09-30 DIAGNOSIS — E1122 Type 2 diabetes mellitus with diabetic chronic kidney disease: Secondary | ICD-10-CM | POA: Diagnosis not present

## 2016-09-30 DIAGNOSIS — Z7901 Long term (current) use of anticoagulants: Secondary | ICD-10-CM | POA: Diagnosis not present

## 2016-09-30 DIAGNOSIS — R233 Spontaneous ecchymoses: Secondary | ICD-10-CM

## 2016-09-30 DIAGNOSIS — D4622 Refractory anemia with excess of blasts 2: Secondary | ICD-10-CM

## 2016-09-30 DIAGNOSIS — Z7984 Long term (current) use of oral hypoglycemic drugs: Secondary | ICD-10-CM | POA: Diagnosis not present

## 2016-09-30 DIAGNOSIS — D61818 Other pancytopenia: Secondary | ICD-10-CM | POA: Diagnosis not present

## 2016-09-30 DIAGNOSIS — Z87442 Personal history of urinary calculi: Secondary | ICD-10-CM

## 2016-09-30 DIAGNOSIS — I129 Hypertensive chronic kidney disease with stage 1 through stage 4 chronic kidney disease, or unspecified chronic kidney disease: Secondary | ICD-10-CM | POA: Diagnosis not present

## 2016-09-30 DIAGNOSIS — Z87891 Personal history of nicotine dependence: Secondary | ICD-10-CM | POA: Diagnosis not present

## 2016-09-30 DIAGNOSIS — E785 Hyperlipidemia, unspecified: Secondary | ICD-10-CM

## 2016-09-30 DIAGNOSIS — K219 Gastro-esophageal reflux disease without esophagitis: Secondary | ICD-10-CM

## 2016-09-30 DIAGNOSIS — D46Z Other myelodysplastic syndromes: Secondary | ICD-10-CM

## 2016-09-30 DIAGNOSIS — N183 Chronic kidney disease, stage 3 (moderate): Secondary | ICD-10-CM | POA: Diagnosis not present

## 2016-09-30 DIAGNOSIS — Z792 Long term (current) use of antibiotics: Secondary | ICD-10-CM

## 2016-09-30 DIAGNOSIS — Z79899 Other long term (current) drug therapy: Secondary | ICD-10-CM | POA: Diagnosis not present

## 2016-09-30 LAB — COMPREHENSIVE METABOLIC PANEL
ALBUMIN: 4.1 g/dL (ref 3.5–5.0)
ALT: 27 U/L (ref 14–54)
AST: 24 U/L (ref 15–41)
Alkaline Phosphatase: 77 U/L (ref 38–126)
Anion gap: 6 (ref 5–15)
BUN: 30 mg/dL — AB (ref 6–20)
CO2: 28 mmol/L (ref 22–32)
CREATININE: 1.21 mg/dL — AB (ref 0.44–1.00)
Calcium: 9.5 mg/dL (ref 8.9–10.3)
Chloride: 105 mmol/L (ref 101–111)
GFR calc Af Amer: 49 mL/min — ABNORMAL LOW (ref >60)
GFR, EST NON AFRICAN AMERICAN: 43 mL/min — AB (ref >60)
GLUCOSE: 125 mg/dL — AB (ref 65–99)
Potassium: 4.9 mmol/L (ref 3.5–5.1)
Sodium: 139 mmol/L (ref 135–145)
Total Bilirubin: 0.3 mg/dL (ref 0.3–1.2)
Total Protein: 7.7 g/dL (ref 6.5–8.1)

## 2016-09-30 LAB — CBC WITH DIFFERENTIAL/PLATELET
BASOS ABS: 0 10*3/uL (ref 0–0.1)
BASOS PCT: 0 %
Eosinophils Absolute: 0 10*3/uL (ref 0–0.7)
Eosinophils Relative: 0 %
HEMATOCRIT: 25.5 % — AB (ref 35.0–47.0)
Hemoglobin: 8.4 g/dL — ABNORMAL LOW (ref 12.0–16.0)
LYMPHS PCT: 35 %
Lymphs Abs: 1.5 10*3/uL (ref 1.0–3.6)
MCH: 33.7 pg (ref 26.0–34.0)
MCHC: 33.1 g/dL (ref 32.0–36.0)
MCV: 101.6 fL — AB (ref 80.0–100.0)
Monocytes Absolute: 0.2 10*3/uL (ref 0.2–0.9)
Monocytes Relative: 4 %
NEUTROS ABS: 2.6 10*3/uL (ref 1.4–6.5)
Neutrophils Relative %: 61 %
PLATELETS: 53 10*3/uL — AB (ref 150–440)
RBC: 2.51 MIL/uL — AB (ref 3.80–5.20)
RDW: 19 % — ABNORMAL HIGH (ref 11.5–14.5)
WBC: 4.3 10*3/uL (ref 3.6–11.0)

## 2016-09-30 LAB — SAMPLE TO BLOOD BANK

## 2016-09-30 MED ORDER — ONDANSETRON HCL 4 MG PO TABS
8.0000 mg | ORAL_TABLET | Freq: Once | ORAL | Status: AC
  Administered 2016-09-30: 8 mg via ORAL
  Filled 2016-09-30: qty 2

## 2016-09-30 MED ORDER — AZACITIDINE CHEMO SQ INJECTION
75.0000 mg/m2 | Freq: Once | INTRAMUSCULAR | Status: AC
  Administered 2016-09-30: 165 mg via SUBCUTANEOUS
  Filled 2016-09-30: qty 6.6

## 2016-09-30 NOTE — Progress Notes (Signed)
Patient here today for follow up.  Patient has no new concerns today  

## 2016-09-30 NOTE — Progress Notes (Signed)
Platelets: 53,000. MD, Dr. Rogue Bussing, notified via telephone and already aware. Per MD order: proceed with scheduled treatment today.

## 2016-09-30 NOTE — Telephone Encounter (Signed)
Pt states she needs a refill on Metformin. Pt states this week she is going to chemo every day and next week 2 times but stays to sick to come out. Please send this in to [facility]. Pt is completely out.

## 2016-09-30 NOTE — Assessment & Plan Note (Addendum)
Refractory anemia- with excess blasts- II [blasts percentage 14%] FISH- normal karyotype. On vidaza; s/p  cycle # 5 BMX- Partial response noted < 5% blasts noted; continued hyper-cellular bone marrow; dysplastic changes. Today Hemoglobin 8.4 platelets 53. ANC 2.6. Given the response to treatment patient is not a candidate for any second line clinical trials at this time at [facility]. Discussed with Dr. Royce Macadamia.  # Proceed with Cycle 6, Day 1 today [thrombocytopebia ~53]- question pancytopenia secondary to Vidaza; less likely from worsening malignancy as bone marrow is improved.   # Anemia 8.4 and thrombocytopenia-platelets 43-possibly secondary to [place]. Proceed with treatment today. Will check CBC with diff weekly. Possible blood transfusion at some point.   #CKD stage III- BUN and CR slightly elevated today. Encouraged fluids (6-8 glasses's/day). Will continue to monitor.   # Prophylactic antibiotics- acyclovir/ diflucan.    # follow up with cycle #7 in 4 weeks/labs/MD/treatment day-1-7.

## 2016-09-30 NOTE — Progress Notes (Signed)
[place] NOTE  Patient Care Team: Roselee Nova, [name]  CHIEF COMPLAINTS/PURPOSE OF CONSULTATION:   Oncology History   #JUNE 2017- Severe neutropenia/ Anemia ~hb 9/platlets- 85-100 AUG 2017- REFRACTORY ANEMIA with EXCESS BLASTS [14% blasts- BMBx]; cytogenetics/FISH-N [SNP micorarray- not done]   # AUG 21st-  START AZA 75mg /m2 day- 1-7 q 28 days x4 cycles; DEC 6th- BMBx- <5% blasts; hypercellular with dysplasia.   # CKD stage III     MDS (myelodysplastic syndrome), high grade ([place])   04/23/2016 Initial Diagnosis    MDS (myelodysplastic syndrome), high grade (HCC)         HISTORY OF PRESENTING ILLNESS:  Teresa Carrillo 76 y.o.  female with Recently diagnosed MDS/high-grade currently started on Vidaza Status post cycle #5 approximately 4 weeks ago is here for follow-up and further treatment.  Patient has not had any recent blood transfusion or platelet transfusion. Patient had a recent follow-up at [facility] hematology/Dr.Foster who agrees with plan of care.   She continues to have easy bruising and occasional bleeding of the gums after brushing her teeth. Otherwise no bleeding. No fevers. No chills. Appetite is good. No weight loss.   Patient denies any nausea vomiting.  Denies any fevers. Denies any chills. Continues to chronic fatigue. Not any worse.  ROS: A c is done which is negative except mentioned above in history of present illness  MEDICAL HISTORY:  Past Medical History:  Diagnosis Date  . Abdominal wall mass   . Anginal pain ([place])   . Anxiety   . Arthritis   . Calculus of kidney   . Cystitis   . Depression   . Diabetes mellitus without complication (HCC)    elevated A1c  . Dyspnea on exertion   . Elevated serum creatinine   . Fibrocystic breast disease   . GERD (gastroesophageal reflux disease)   . Hearing loss   . Heart murmur   . HTN (hypertension)   . Hyperlipidemia   . MDS (myelodysplastic  syndrome), high grade ([place]) 04/23/2016  . Microscopic hematuria   . Mucositis due to chemotherapy   . Obesity   . Risk for falls   . Sleep apnea   . Thrombocytopenia ([place])   . Urinary frequency   . Urinary urgency     SURGICAL HISTORY: Past Surgical History:  Procedure Laterality Date  . ABDOMINAL HYSTERECTOMY    . APPENDECTOMY    . CARDIAC CATHETERIZATION     x2  . CHOLECYSTECTOMY    . COLONOSCOPY N/A 02/24/2015   Procedure: COLONOSCOPY;  Surgeon: Manya Silvas, MD;  Location: [facility] ENDOSCOPY;  Service: Endoscopy;  Laterality: N/A;  . DIAGNOSTIC LAPAROSCOPY     Removal of benign abdominal tumor  . ESOPHAGOGASTRODUODENOSCOPY N/A 02/24/2015   Procedure: ESOPHAGOGASTRODUODENOSCOPY (EGD);  Surgeon: Manya Silvas, MD;  Location: [facility] ENDOSCOPY;  Service: Endoscopy;  Laterality: N/A;  . 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 No smoking/ no alcohol.  Social History   Social History  . Marital status: Married    Spouse name: N/A  . Number of children: N/A  . Years of education: N/A   Occupational History  . Not on file.   Social History Main Topics  . Smoking status: Former Smoker    Quit date: 09/09/1988  . Smokeless tobacco: Never Used     Comment: quit 25 years ago  . Alcohol use  No  . Drug use: No  . Sexual activity: Not on file   Other Topics Concern  . Not on file   Social History Narrative  . No narrative on file    FAMILY HISTORY: no cancers in family.  Family History  Problem Relation Age of Onset  . Congestive Heart Failure Mother   . Diabetes Mother   . Coronary artery disease Mother   . Stroke Mother   . Cirrhosis Father     ALLERGIES:  is allergic to macrobid [nitrofurantoin monohyd macro].  MEDICATIONS:  Current Outpatient Prescriptions  Medication Sig Dispense Refill  . acyclovir (ZOVIRAX) 400 MG tablet Take 1 tablet (400 mg total) by mouth 2 (two) times daily. 120 tablet 2  .  buPROPion (WELLBUTRIN XL) 150 MG 24 hr tablet Take 300 mg by mouth daily at 12 noon.     . clonazePAM (KLONOPIN) 0.5 MG tablet Take 0.5 mg by mouth 2 (two) times daily.     . hydrocortisone (ANUSOL-HC) 2.5 % rectal cream Place 1 application rectally 2 (two) times daily as needed for hemorrhoids or itching. 30 g 0  . LUMIGAN 0.01 % SOLN Place 1 drop into both eyes at bedtime.     . metFORMIN (GLUCOPHAGE) 500 MG tablet TAKE 1 TABLET BY MOUTH DAILY 90 tablet 0  . Multiple Vitamin (MULTIVITAMIN WITH MINERALS) TABS tablet Take 1 tablet by mouth daily.    . Omega-3 Fatty Acids (FISH OIL) 1000 MG CAPS Take 1 capsule by mouth daily.    . ondansetron (ZOFRAN-ODT) 4 MG disintegrating tablet Take 4 mg by mouth every 8 (eight) hours as needed for nausea or vomiting.    . polyethylene glycol (MIRALAX / GLYCOLAX) packet Take 17 g by mouth daily as needed for mild constipation. 14 each 1  . prochlorperazine (COMPAZINE) 10 MG tablet Take 1 tablet (10 mg total) by mouth every 6 (six) hours as needed (Nausea or vomiting). 30 tablet 1  . rosuvastatin (CRESTOR) 20 MG tablet TAKE 1 TABLET(20 MG) BY MOUTH DAILY 90 tablet 0  . sertraline (ZOLOFT) 100 MG tablet Take 100 mg by mouth daily at 12 noon.     Marland Kitchen Umeclidinium-Vilanterol (ANORO ELLIPTA) 62.5-25 MCG/INH AEPB Inhale 1 puff into the lungs daily as needed (shortness of breath).      No current facility-administered medications for this visit.       Marland Kitchen  PHYSICAL EXAMINATION: ECOG PERFORMANCE STATUS: 1 - Symptomatic but completely ambulatory  Vitals:   09/30/16 1012  BP: 126/73  Pulse: 69  Temp: (!) 96.8 F (36 C)   Filed Weights   09/30/16 1012  Weight: 233 lb (105.7 kg)    GENERAL: Well-nourished well-developed; Alert, no distress and comfortable.   Obese. Accompanied by her sister-in-law/husband. She is walking herself.  EYES: Positive for pallor. OROPHARYNX: no thrush or ulceration;  NECK: supple, no masses felt LYMPH:  no palpable  lymphadenopathy in the cervical, axillary or inguinal regions LUNGS: clear to auscultation and  No wheeze or crackles HEART/CVS: regular rate & rhythm and no murmurs; No lower extremity edema ABDOMEN: abdomen soft, non-tender and normal bowel sounds Musculoskeletal:no cyanosis of digits and no clubbing  PSYCH: alert & oriented x 3 with fluent speech NEURO: no focal motor/sensory deficits SKIN:  ablation noted on the right leg /improving no signs of infection.   LABORATORY DATA:  I have reviewed the data as listed Lab Results  Component Value Date   WBC 4.3 09/30/2016   HGB 8.4 (L) 09/30/2016  HCT 25.5 (L) 09/30/2016   MCV 101.6 (H) 09/30/2016   PLT 53 (L) 09/30/2016    Recent Labs  05/07/16 1652  07/22/16 1017  09/03/16 1400 09/10/16 1110 09/30/16 0942  NA  --   < > 138  < > 141 138 139  K  --   < > 4.6  < > 4.5 4.1 4.9  CL  --   < > 105  < > 110 104 105  CO2  --   < > 26  < > 26 26 28   GLUCOSE  --   < > 126*  < > 126* 167* 125*  BUN  --   < > 23*  < > 22* 22* 30*  CREATININE  --   < > 1.14*  < > 0.97 0.99 1.21*  CALCIUM  --   < > 9.1  < > 9.0 9.0 9.5  GFRNONAA  --   < > 46*  < > 56* 54* 43*  GFRAA  --   < > 54*  < > >60 >60 49*  PROT 8.3*  < > 7.4  --   --  7.4 7.7  ALBUMIN 3.8  < > 4.0  --   --  3.9 4.1  AST 26  < > 24  --   --  24 24  ALT 25  < > 31  --   --  23 27  ALKPHOS 65  < > 76  --   --  77 77  BILITOT 0.6  < > 0.5  --   --  0.4 0.3  BILIDIR 0.1  --   --   --   --   --   --   IBILI 0.5  --   --   --   --   --   --   < > = values in this interval not displayed.  RADIOGRAPHIC STUDIES: I have personally reviewed the radiological images as listed and agreed with the findings in the report. No results found.  ASSESSMENT & PLAN:   MDS (myelodysplastic syndrome), high grade (HCC) Refractory anemia- with excess blasts- II [blasts percentage 14%] FISH- normal karyotype. On vidaza; s/p  cycle # 5 BMX- Partial response noted < 5% blasts noted; continued  hyper-cellular bone marrow; dysplastic changes. Today Hemoglobin 8.4 platelets 53. ANC 2.6. Given the response to treatment patient is not a candidate for any second line clinical trials at this time at [facility]. Discussed with Dr. Royce Macadamia.  # Proceed with Cycle 6, Day 1 today [thrombocytopebia ~53]- question pancytopenia secondary to Vidaza; less likely from worsening malignancy as bone marrow is improved.   # Anemia 8.4 and thrombocytopenia-platelets 43-possibly secondary to [place]. Proceed with treatment today. Will check CBC with diff weekly. Possible blood transfusion at some point.   #CKD stage III- BUN and CR slightly elevated today. Encouraged fluids (6-8 glasses's/day). Will continue to monitor.   # Prophylactic antibiotics- acyclovir/ diflucan.    # follow up with cycle #7 in 4 weeks/labs/MD/treatment day-1-7.   Faythe Casa, NP    Jacquelin Hawking, NP 09/30/2016 1:19 PM

## 2016-10-01 ENCOUNTER — Ambulatory Visit: Payer: Medicare Other

## 2016-10-01 ENCOUNTER — Inpatient Hospital Stay: Payer: Medicare Other

## 2016-10-01 VITALS — BP 107/68 | HR 74 | Temp 96.8°F | Resp 18

## 2016-10-01 DIAGNOSIS — Z7901 Long term (current) use of anticoagulants: Secondary | ICD-10-CM | POA: Diagnosis not present

## 2016-10-01 DIAGNOSIS — D4622 Refractory anemia with excess of blasts 2: Secondary | ICD-10-CM | POA: Diagnosis not present

## 2016-10-01 DIAGNOSIS — Z87891 Personal history of nicotine dependence: Secondary | ICD-10-CM | POA: Diagnosis not present

## 2016-10-01 DIAGNOSIS — E1122 Type 2 diabetes mellitus with diabetic chronic kidney disease: Secondary | ICD-10-CM | POA: Diagnosis not present

## 2016-10-01 DIAGNOSIS — K219 Gastro-esophageal reflux disease without esophagitis: Secondary | ICD-10-CM | POA: Diagnosis not present

## 2016-10-01 DIAGNOSIS — I129 Hypertensive chronic kidney disease with stage 1 through stage 4 chronic kidney disease, or unspecified chronic kidney disease: Secondary | ICD-10-CM | POA: Diagnosis not present

## 2016-10-01 DIAGNOSIS — E785 Hyperlipidemia, unspecified: Secondary | ICD-10-CM | POA: Diagnosis not present

## 2016-10-01 DIAGNOSIS — N183 Chronic kidney disease, stage 3 (moderate): Secondary | ICD-10-CM | POA: Diagnosis not present

## 2016-10-01 DIAGNOSIS — Z792 Long term (current) use of antibiotics: Secondary | ICD-10-CM | POA: Diagnosis not present

## 2016-10-01 DIAGNOSIS — D46Z Other myelodysplastic syndromes: Secondary | ICD-10-CM

## 2016-10-01 DIAGNOSIS — R233 Spontaneous ecchymoses: Secondary | ICD-10-CM | POA: Diagnosis not present

## 2016-10-01 DIAGNOSIS — Z87442 Personal history of urinary calculi: Secondary | ICD-10-CM | POA: Diagnosis not present

## 2016-10-01 DIAGNOSIS — D61818 Other pancytopenia: Secondary | ICD-10-CM | POA: Diagnosis not present

## 2016-10-01 DIAGNOSIS — Z79899 Other long term (current) drug therapy: Secondary | ICD-10-CM | POA: Diagnosis not present

## 2016-10-01 DIAGNOSIS — Z7984 Long term (current) use of oral hypoglycemic drugs: Secondary | ICD-10-CM | POA: Diagnosis not present

## 2016-10-01 MED ORDER — ONDANSETRON HCL 4 MG PO TABS
8.0000 mg | ORAL_TABLET | Freq: Once | ORAL | Status: AC
  Administered 2016-10-01: 8 mg via ORAL
  Filled 2016-10-01: qty 2

## 2016-10-01 MED ORDER — AZACITIDINE CHEMO SQ INJECTION
75.0000 mg/m2 | Freq: Once | INTRAMUSCULAR | Status: AC
  Administered 2016-10-01: 165 mg via SUBCUTANEOUS
  Filled 2016-10-01: qty 6.6

## 2016-10-02 ENCOUNTER — Inpatient Hospital Stay: Payer: Medicare Other

## 2016-10-02 DIAGNOSIS — E1122 Type 2 diabetes mellitus with diabetic chronic kidney disease: Secondary | ICD-10-CM | POA: Diagnosis not present

## 2016-10-02 DIAGNOSIS — N183 Chronic kidney disease, stage 3 (moderate): Secondary | ICD-10-CM | POA: Diagnosis not present

## 2016-10-02 DIAGNOSIS — D4622 Refractory anemia with excess of blasts 2: Secondary | ICD-10-CM | POA: Diagnosis not present

## 2016-10-02 DIAGNOSIS — Z7984 Long term (current) use of oral hypoglycemic drugs: Secondary | ICD-10-CM | POA: Diagnosis not present

## 2016-10-02 DIAGNOSIS — Z87442 Personal history of urinary calculi: Secondary | ICD-10-CM | POA: Diagnosis not present

## 2016-10-02 DIAGNOSIS — D61818 Other pancytopenia: Secondary | ICD-10-CM | POA: Diagnosis not present

## 2016-10-02 DIAGNOSIS — D46Z Other myelodysplastic syndromes: Secondary | ICD-10-CM

## 2016-10-02 DIAGNOSIS — Z87891 Personal history of nicotine dependence: Secondary | ICD-10-CM | POA: Diagnosis not present

## 2016-10-02 DIAGNOSIS — Z7901 Long term (current) use of anticoagulants: Secondary | ICD-10-CM | POA: Diagnosis not present

## 2016-10-02 DIAGNOSIS — R233 Spontaneous ecchymoses: Secondary | ICD-10-CM | POA: Diagnosis not present

## 2016-10-02 DIAGNOSIS — K219 Gastro-esophageal reflux disease without esophagitis: Secondary | ICD-10-CM | POA: Diagnosis not present

## 2016-10-02 DIAGNOSIS — E785 Hyperlipidemia, unspecified: Secondary | ICD-10-CM | POA: Diagnosis not present

## 2016-10-02 DIAGNOSIS — Z792 Long term (current) use of antibiotics: Secondary | ICD-10-CM | POA: Diagnosis not present

## 2016-10-02 DIAGNOSIS — Z79899 Other long term (current) drug therapy: Secondary | ICD-10-CM | POA: Diagnosis not present

## 2016-10-02 DIAGNOSIS — I129 Hypertensive chronic kidney disease with stage 1 through stage 4 chronic kidney disease, or unspecified chronic kidney disease: Secondary | ICD-10-CM | POA: Diagnosis not present

## 2016-10-02 MED ORDER — AZACITIDINE CHEMO SQ INJECTION
75.0000 mg/m2 | Freq: Once | INTRAMUSCULAR | Status: AC
  Administered 2016-10-02: 165 mg via SUBCUTANEOUS
  Filled 2016-10-02: qty 6.6

## 2016-10-02 MED ORDER — ONDANSETRON HCL 4 MG PO TABS
8.0000 mg | ORAL_TABLET | Freq: Once | ORAL | Status: AC
  Administered 2016-10-02: 8 mg via ORAL
  Filled 2016-10-02: qty 2

## 2016-10-03 ENCOUNTER — Inpatient Hospital Stay: Payer: Medicare Other

## 2016-10-03 DIAGNOSIS — E785 Hyperlipidemia, unspecified: Secondary | ICD-10-CM | POA: Diagnosis not present

## 2016-10-03 DIAGNOSIS — Z7901 Long term (current) use of anticoagulants: Secondary | ICD-10-CM | POA: Diagnosis not present

## 2016-10-03 DIAGNOSIS — Z87891 Personal history of nicotine dependence: Secondary | ICD-10-CM | POA: Diagnosis not present

## 2016-10-03 DIAGNOSIS — Z7984 Long term (current) use of oral hypoglycemic drugs: Secondary | ICD-10-CM | POA: Diagnosis not present

## 2016-10-03 DIAGNOSIS — I129 Hypertensive chronic kidney disease with stage 1 through stage 4 chronic kidney disease, or unspecified chronic kidney disease: Secondary | ICD-10-CM | POA: Diagnosis not present

## 2016-10-03 DIAGNOSIS — D4622 Refractory anemia with excess of blasts 2: Secondary | ICD-10-CM | POA: Diagnosis not present

## 2016-10-03 DIAGNOSIS — D61818 Other pancytopenia: Secondary | ICD-10-CM | POA: Diagnosis not present

## 2016-10-03 DIAGNOSIS — E1122 Type 2 diabetes mellitus with diabetic chronic kidney disease: Secondary | ICD-10-CM | POA: Diagnosis not present

## 2016-10-03 DIAGNOSIS — K219 Gastro-esophageal reflux disease without esophagitis: Secondary | ICD-10-CM | POA: Diagnosis not present

## 2016-10-03 DIAGNOSIS — Z87442 Personal history of urinary calculi: Secondary | ICD-10-CM | POA: Diagnosis not present

## 2016-10-03 DIAGNOSIS — N183 Chronic kidney disease, stage 3 (moderate): Secondary | ICD-10-CM | POA: Diagnosis not present

## 2016-10-03 DIAGNOSIS — R233 Spontaneous ecchymoses: Secondary | ICD-10-CM | POA: Diagnosis not present

## 2016-10-03 DIAGNOSIS — Z792 Long term (current) use of antibiotics: Secondary | ICD-10-CM | POA: Diagnosis not present

## 2016-10-03 DIAGNOSIS — D46Z Other myelodysplastic syndromes: Secondary | ICD-10-CM

## 2016-10-03 DIAGNOSIS — Z79899 Other long term (current) drug therapy: Secondary | ICD-10-CM | POA: Diagnosis not present

## 2016-10-03 MED ORDER — ONDANSETRON HCL 4 MG PO TABS
8.0000 mg | ORAL_TABLET | Freq: Once | ORAL | Status: AC
  Administered 2016-10-03: 8 mg via ORAL
  Filled 2016-10-03: qty 2

## 2016-10-03 MED ORDER — AZACITIDINE CHEMO SQ INJECTION
75.0000 mg/m2 | Freq: Once | INTRAMUSCULAR | Status: AC
  Administered 2016-10-03: 165 mg via SUBCUTANEOUS
  Filled 2016-10-03: qty 6.6

## 2016-10-04 ENCOUNTER — Inpatient Hospital Stay: Payer: Medicare Other

## 2016-10-04 VITALS — BP 155/78 | HR 86 | Temp 96.9°F | Resp 18

## 2016-10-04 DIAGNOSIS — Z792 Long term (current) use of antibiotics: Secondary | ICD-10-CM | POA: Diagnosis not present

## 2016-10-04 DIAGNOSIS — D61818 Other pancytopenia: Secondary | ICD-10-CM | POA: Diagnosis not present

## 2016-10-04 DIAGNOSIS — N183 Chronic kidney disease, stage 3 (moderate): Secondary | ICD-10-CM | POA: Diagnosis not present

## 2016-10-04 DIAGNOSIS — I129 Hypertensive chronic kidney disease with stage 1 through stage 4 chronic kidney disease, or unspecified chronic kidney disease: Secondary | ICD-10-CM | POA: Diagnosis not present

## 2016-10-04 DIAGNOSIS — Z87442 Personal history of urinary calculi: Secondary | ICD-10-CM | POA: Diagnosis not present

## 2016-10-04 DIAGNOSIS — K219 Gastro-esophageal reflux disease without esophagitis: Secondary | ICD-10-CM | POA: Diagnosis not present

## 2016-10-04 DIAGNOSIS — Z7984 Long term (current) use of oral hypoglycemic drugs: Secondary | ICD-10-CM | POA: Diagnosis not present

## 2016-10-04 DIAGNOSIS — Z87891 Personal history of nicotine dependence: Secondary | ICD-10-CM | POA: Diagnosis not present

## 2016-10-04 DIAGNOSIS — E785 Hyperlipidemia, unspecified: Secondary | ICD-10-CM | POA: Diagnosis not present

## 2016-10-04 DIAGNOSIS — E1122 Type 2 diabetes mellitus with diabetic chronic kidney disease: Secondary | ICD-10-CM | POA: Diagnosis not present

## 2016-10-04 DIAGNOSIS — D4622 Refractory anemia with excess of blasts 2: Secondary | ICD-10-CM | POA: Diagnosis not present

## 2016-10-04 DIAGNOSIS — D46Z Other myelodysplastic syndromes: Secondary | ICD-10-CM

## 2016-10-04 DIAGNOSIS — Z79899 Other long term (current) drug therapy: Secondary | ICD-10-CM | POA: Diagnosis not present

## 2016-10-04 DIAGNOSIS — R233 Spontaneous ecchymoses: Secondary | ICD-10-CM | POA: Diagnosis not present

## 2016-10-04 DIAGNOSIS — Z7901 Long term (current) use of anticoagulants: Secondary | ICD-10-CM | POA: Diagnosis not present

## 2016-10-04 MED ORDER — AZACITIDINE CHEMO SQ INJECTION
75.0000 mg/m2 | Freq: Once | INTRAMUSCULAR | Status: AC
  Administered 2016-10-04: 165 mg via SUBCUTANEOUS
  Filled 2016-10-04: qty 6.6

## 2016-10-04 MED ORDER — ONDANSETRON HCL 4 MG PO TABS
8.0000 mg | ORAL_TABLET | Freq: Once | ORAL | Status: AC
  Administered 2016-10-04: 8 mg via ORAL
  Filled 2016-10-04: qty 2

## 2016-10-07 ENCOUNTER — Other Ambulatory Visit: Payer: Self-pay | Admitting: *Deleted

## 2016-10-07 ENCOUNTER — Inpatient Hospital Stay: Payer: Medicare Other

## 2016-10-07 VITALS — BP 154/76 | HR 70 | Temp 97.5°F | Resp 20

## 2016-10-07 DIAGNOSIS — Z792 Long term (current) use of antibiotics: Secondary | ICD-10-CM | POA: Diagnosis not present

## 2016-10-07 DIAGNOSIS — D61818 Other pancytopenia: Secondary | ICD-10-CM | POA: Diagnosis not present

## 2016-10-07 DIAGNOSIS — D46Z Other myelodysplastic syndromes: Secondary | ICD-10-CM

## 2016-10-07 DIAGNOSIS — Z79899 Other long term (current) drug therapy: Secondary | ICD-10-CM | POA: Diagnosis not present

## 2016-10-07 DIAGNOSIS — Z87442 Personal history of urinary calculi: Secondary | ICD-10-CM | POA: Diagnosis not present

## 2016-10-07 DIAGNOSIS — D4622 Refractory anemia with excess of blasts 2: Secondary | ICD-10-CM | POA: Diagnosis not present

## 2016-10-07 DIAGNOSIS — J029 Acute pharyngitis, unspecified: Secondary | ICD-10-CM

## 2016-10-07 DIAGNOSIS — R233 Spontaneous ecchymoses: Secondary | ICD-10-CM | POA: Diagnosis not present

## 2016-10-07 DIAGNOSIS — Z7901 Long term (current) use of anticoagulants: Secondary | ICD-10-CM | POA: Diagnosis not present

## 2016-10-07 DIAGNOSIS — I129 Hypertensive chronic kidney disease with stage 1 through stage 4 chronic kidney disease, or unspecified chronic kidney disease: Secondary | ICD-10-CM | POA: Diagnosis not present

## 2016-10-07 DIAGNOSIS — Z7984 Long term (current) use of oral hypoglycemic drugs: Secondary | ICD-10-CM | POA: Diagnosis not present

## 2016-10-07 DIAGNOSIS — E1122 Type 2 diabetes mellitus with diabetic chronic kidney disease: Secondary | ICD-10-CM | POA: Diagnosis not present

## 2016-10-07 DIAGNOSIS — K219 Gastro-esophageal reflux disease without esophagitis: Secondary | ICD-10-CM | POA: Diagnosis not present

## 2016-10-07 DIAGNOSIS — Z87891 Personal history of nicotine dependence: Secondary | ICD-10-CM | POA: Diagnosis not present

## 2016-10-07 DIAGNOSIS — E785 Hyperlipidemia, unspecified: Secondary | ICD-10-CM | POA: Diagnosis not present

## 2016-10-07 DIAGNOSIS — N183 Chronic kidney disease, stage 3 (moderate): Secondary | ICD-10-CM | POA: Diagnosis not present

## 2016-10-07 LAB — CBC WITH DIFFERENTIAL/PLATELET
BASOS ABS: 0 10*3/uL (ref 0–0.1)
BASOS PCT: 1 %
Eosinophils Absolute: 0 10*3/uL (ref 0–0.7)
Eosinophils Relative: 0 %
HEMATOCRIT: 23.8 % — AB (ref 35.0–47.0)
Hemoglobin: 7.9 g/dL — ABNORMAL LOW (ref 12.0–16.0)
LYMPHS PCT: 41 %
Lymphs Abs: 0.9 10*3/uL — ABNORMAL LOW (ref 1.0–3.6)
MCH: 33.8 pg (ref 26.0–34.0)
MCHC: 33.1 g/dL (ref 32.0–36.0)
MCV: 102.1 fL — AB (ref 80.0–100.0)
MONO ABS: 0.2 10*3/uL (ref 0.2–0.9)
Monocytes Relative: 8 %
NEUTROS ABS: 1.1 10*3/uL — AB (ref 1.4–6.5)
Neutrophils Relative %: 50 %
Platelets: 108 10*3/uL — ABNORMAL LOW (ref 150–440)
RBC: 2.34 MIL/uL — AB (ref 3.80–5.20)
RDW: 19.5 % — AB (ref 11.5–14.5)
WBC: 2.2 10*3/uL — AB (ref 3.6–11.0)

## 2016-10-07 LAB — SAMPLE TO BLOOD BANK

## 2016-10-07 MED ORDER — AZACITIDINE CHEMO SQ INJECTION
75.0000 mg/m2 | Freq: Once | INTRAMUSCULAR | Status: AC
  Administered 2016-10-07: 165 mg via SUBCUTANEOUS
  Filled 2016-10-07: qty 6.6

## 2016-10-07 MED ORDER — ONDANSETRON HCL 4 MG PO TABS
8.0000 mg | ORAL_TABLET | Freq: Once | ORAL | Status: AC
  Administered 2016-10-07: 8 mg via ORAL
  Filled 2016-10-07: qty 2

## 2016-10-07 MED ORDER — MAGIC MOUTHWASH W/LIDOCAINE: 5.0000 mL | Freq: Four times a day (QID) | ORAL | 3 refills | Status: DC

## 2016-10-07 MED ORDER — LEVOFLOXACIN 500 MG PO TABS: 500.0000 mg | ORAL_TABLET | Freq: Every day | ORAL | 0 refills | Status: DC

## 2016-10-07 NOTE — Progress Notes (Addendum)
Received phone call from [place], [place] in infusion-[facility] center.  Pt states her Levaquin rx did not get sent to pharmacy. Spoke with Dr. Rogue Bussing. md gave verbal order to sent Levaquin 500 mg tablet once daily x 7 days to pt's pharmacy. Pt also needs RF on magic mouth wash. These rx were faxed to patient's [facility] pharmacy in [place]. Pt still has persistent sore throat. Not improved with magic mouthwash at home. hgb today is 7.9. Stable hgb per md. Will recheck the labs again next week at next lab only sch. Apt.

## 2016-10-08 ENCOUNTER — Inpatient Hospital Stay: Payer: Medicare Other

## 2016-10-08 VITALS — BP 123/72 | HR 80 | Temp 96.7°F | Resp 20

## 2016-10-08 DIAGNOSIS — I129 Hypertensive chronic kidney disease with stage 1 through stage 4 chronic kidney disease, or unspecified chronic kidney disease: Secondary | ICD-10-CM | POA: Diagnosis not present

## 2016-10-08 DIAGNOSIS — Z87891 Personal history of nicotine dependence: Secondary | ICD-10-CM | POA: Diagnosis not present

## 2016-10-08 DIAGNOSIS — Z7901 Long term (current) use of anticoagulants: Secondary | ICD-10-CM | POA: Diagnosis not present

## 2016-10-08 DIAGNOSIS — D4622 Refractory anemia with excess of blasts 2: Secondary | ICD-10-CM | POA: Diagnosis not present

## 2016-10-08 DIAGNOSIS — Z7984 Long term (current) use of oral hypoglycemic drugs: Secondary | ICD-10-CM | POA: Diagnosis not present

## 2016-10-08 DIAGNOSIS — E785 Hyperlipidemia, unspecified: Secondary | ICD-10-CM | POA: Diagnosis not present

## 2016-10-08 DIAGNOSIS — Z87442 Personal history of urinary calculi: Secondary | ICD-10-CM | POA: Diagnosis not present

## 2016-10-08 DIAGNOSIS — D46Z Other myelodysplastic syndromes: Secondary | ICD-10-CM

## 2016-10-08 DIAGNOSIS — R233 Spontaneous ecchymoses: Secondary | ICD-10-CM | POA: Diagnosis not present

## 2016-10-08 DIAGNOSIS — Z79899 Other long term (current) drug therapy: Secondary | ICD-10-CM | POA: Diagnosis not present

## 2016-10-08 DIAGNOSIS — D61818 Other pancytopenia: Secondary | ICD-10-CM | POA: Diagnosis not present

## 2016-10-08 DIAGNOSIS — E1122 Type 2 diabetes mellitus with diabetic chronic kidney disease: Secondary | ICD-10-CM | POA: Diagnosis not present

## 2016-10-08 DIAGNOSIS — N183 Chronic kidney disease, stage 3 (moderate): Secondary | ICD-10-CM | POA: Diagnosis not present

## 2016-10-08 DIAGNOSIS — Z792 Long term (current) use of antibiotics: Secondary | ICD-10-CM | POA: Diagnosis not present

## 2016-10-08 DIAGNOSIS — K219 Gastro-esophageal reflux disease without esophagitis: Secondary | ICD-10-CM | POA: Diagnosis not present

## 2016-10-08 MED ORDER — AZACITIDINE CHEMO SQ INJECTION
75.0000 mg/m2 | Freq: Once | INTRAMUSCULAR | Status: AC
  Administered 2016-10-08: 165 mg via SUBCUTANEOUS
  Filled 2016-10-08 (×2): qty 6.6

## 2016-10-08 MED ORDER — ONDANSETRON HCL 4 MG PO TABS
8.0000 mg | ORAL_TABLET | Freq: Once | ORAL | Status: AC
  Administered 2016-10-08: 8 mg via ORAL
  Filled 2016-10-08: qty 2

## 2016-10-09 ENCOUNTER — Telehealth: Payer: Self-pay | Admitting: *Deleted

## 2016-10-09 DIAGNOSIS — R040 Epistaxis: Secondary | ICD-10-CM

## 2016-10-09 DIAGNOSIS — D46Z Other myelodysplastic syndromes: Secondary | ICD-10-CM

## 2016-10-09 NOTE — Telephone Encounter (Signed)
Called to report that before bed last night which bled for over an hour before she could get it to stop. She states she feels fine this morning without dizziness and it is no longer bleeding, but is concerned that it happened. Her HGB yesterday was 7.9 and plt 108. Please advise

## 2016-10-09 NOTE — Telephone Encounter (Signed)
Per Dr Rogue Bussing add labs to next visit PT/PTT. Patient in agreement with this plan and was advised to call back if she has any further bleeding episodes

## 2016-10-14 ENCOUNTER — Inpatient Hospital Stay: Payer: Medicare Other | Attending: Internal Medicine

## 2016-10-14 DIAGNOSIS — R231 Pallor: Secondary | ICD-10-CM | POA: Insufficient documentation

## 2016-10-14 DIAGNOSIS — Z5111 Encounter for antineoplastic chemotherapy: Secondary | ICD-10-CM | POA: Diagnosis not present

## 2016-10-14 DIAGNOSIS — E1122 Type 2 diabetes mellitus with diabetic chronic kidney disease: Secondary | ICD-10-CM | POA: Insufficient documentation

## 2016-10-14 DIAGNOSIS — I129 Hypertensive chronic kidney disease with stage 1 through stage 4 chronic kidney disease, or unspecified chronic kidney disease: Secondary | ICD-10-CM | POA: Diagnosis not present

## 2016-10-14 DIAGNOSIS — Z7984 Long term (current) use of oral hypoglycemic drugs: Secondary | ICD-10-CM | POA: Insufficient documentation

## 2016-10-14 DIAGNOSIS — E785 Hyperlipidemia, unspecified: Secondary | ICD-10-CM | POA: Diagnosis not present

## 2016-10-14 DIAGNOSIS — N183 Chronic kidney disease, stage 3 (moderate): Secondary | ICD-10-CM | POA: Diagnosis not present

## 2016-10-14 DIAGNOSIS — Z87891 Personal history of nicotine dependence: Secondary | ICD-10-CM | POA: Insufficient documentation

## 2016-10-14 DIAGNOSIS — G4730 Sleep apnea, unspecified: Secondary | ICD-10-CM | POA: Diagnosis not present

## 2016-10-14 DIAGNOSIS — Z87442 Personal history of urinary calculi: Secondary | ICD-10-CM | POA: Diagnosis not present

## 2016-10-14 DIAGNOSIS — F418 Other specified anxiety disorders: Secondary | ICD-10-CM | POA: Insufficient documentation

## 2016-10-14 DIAGNOSIS — E669 Obesity, unspecified: Secondary | ICD-10-CM | POA: Diagnosis not present

## 2016-10-14 DIAGNOSIS — R5382 Chronic fatigue, unspecified: Secondary | ICD-10-CM | POA: Insufficient documentation

## 2016-10-14 DIAGNOSIS — K219 Gastro-esophageal reflux disease without esophagitis: Secondary | ICD-10-CM | POA: Diagnosis not present

## 2016-10-14 DIAGNOSIS — Z79899 Other long term (current) drug therapy: Secondary | ICD-10-CM | POA: Diagnosis not present

## 2016-10-14 DIAGNOSIS — R040 Epistaxis: Secondary | ICD-10-CM

## 2016-10-14 DIAGNOSIS — D46Z Other myelodysplastic syndromes: Secondary | ICD-10-CM | POA: Diagnosis not present

## 2016-10-14 LAB — CBC WITH DIFFERENTIAL/PLATELET
BASOS ABS: 0 10*3/uL (ref 0–0.1)
Basophils Relative: 1 %
Eosinophils Absolute: 0 10*3/uL (ref 0–0.7)
Eosinophils Relative: 2 %
HEMATOCRIT: 24.6 % — AB (ref 35.0–47.0)
Hemoglobin: 8.2 g/dL — ABNORMAL LOW (ref 12.0–16.0)
LYMPHS PCT: 43 %
Lymphs Abs: 1 10*3/uL (ref 1.0–3.6)
MCH: 33.8 pg (ref 26.0–34.0)
MCHC: 33.5 g/dL (ref 32.0–36.0)
MCV: 101 fL — AB (ref 80.0–100.0)
Monocytes Absolute: 0.2 10*3/uL (ref 0.2–0.9)
Monocytes Relative: 7 %
Neutro Abs: 1.1 10*3/uL — ABNORMAL LOW (ref 1.4–6.5)
Neutrophils Relative %: 47 %
Platelets: 100 10*3/uL — ABNORMAL LOW (ref 150–400)
RBC: 2.43 MIL/uL — AB (ref 3.80–5.20)
RDW: 19 % — ABNORMAL HIGH (ref 11.5–14.5)
WBC: 2.3 10*3/uL — AB (ref 3.6–11.0)

## 2016-10-14 LAB — SAMPLE TO BLOOD BANK

## 2016-10-14 LAB — PROTIME-INR
INR: 1.01
Prothrombin Time: 13.3 seconds (ref 11.4–15.2)

## 2016-10-14 LAB — APTT: aPTT: 38 seconds — ABNORMAL HIGH (ref 24–36)

## 2016-10-15 DIAGNOSIS — H40033 Anatomical narrow angle, bilateral: Secondary | ICD-10-CM | POA: Diagnosis not present

## 2016-10-21 ENCOUNTER — Other Ambulatory Visit: Payer: Self-pay | Admitting: Internal Medicine

## 2016-10-21 ENCOUNTER — Other Ambulatory Visit: Payer: Self-pay | Admitting: *Deleted

## 2016-10-21 ENCOUNTER — Inpatient Hospital Stay: Payer: Medicare Other

## 2016-10-21 DIAGNOSIS — E1122 Type 2 diabetes mellitus with diabetic chronic kidney disease: Secondary | ICD-10-CM | POA: Diagnosis not present

## 2016-10-21 DIAGNOSIS — D46Z Other myelodysplastic syndromes: Secondary | ICD-10-CM | POA: Diagnosis not present

## 2016-10-21 DIAGNOSIS — Z87891 Personal history of nicotine dependence: Secondary | ICD-10-CM | POA: Diagnosis not present

## 2016-10-21 DIAGNOSIS — N183 Chronic kidney disease, stage 3 (moderate): Secondary | ICD-10-CM | POA: Diagnosis not present

## 2016-10-21 DIAGNOSIS — G4730 Sleep apnea, unspecified: Secondary | ICD-10-CM | POA: Diagnosis not present

## 2016-10-21 DIAGNOSIS — E785 Hyperlipidemia, unspecified: Secondary | ICD-10-CM | POA: Diagnosis not present

## 2016-10-21 DIAGNOSIS — Z87442 Personal history of urinary calculi: Secondary | ICD-10-CM | POA: Diagnosis not present

## 2016-10-21 DIAGNOSIS — Z5111 Encounter for antineoplastic chemotherapy: Secondary | ICD-10-CM | POA: Diagnosis not present

## 2016-10-21 DIAGNOSIS — D696 Thrombocytopenia, unspecified: Secondary | ICD-10-CM | POA: Insufficient documentation

## 2016-10-21 DIAGNOSIS — K219 Gastro-esophageal reflux disease without esophagitis: Secondary | ICD-10-CM | POA: Diagnosis not present

## 2016-10-21 DIAGNOSIS — Z79899 Other long term (current) drug therapy: Secondary | ICD-10-CM | POA: Diagnosis not present

## 2016-10-21 DIAGNOSIS — I129 Hypertensive chronic kidney disease with stage 1 through stage 4 chronic kidney disease, or unspecified chronic kidney disease: Secondary | ICD-10-CM | POA: Diagnosis not present

## 2016-10-21 DIAGNOSIS — R231 Pallor: Secondary | ICD-10-CM | POA: Diagnosis not present

## 2016-10-21 DIAGNOSIS — R5382 Chronic fatigue, unspecified: Secondary | ICD-10-CM | POA: Diagnosis not present

## 2016-10-21 DIAGNOSIS — Z7984 Long term (current) use of oral hypoglycemic drugs: Secondary | ICD-10-CM | POA: Diagnosis not present

## 2016-10-21 LAB — CBC WITH DIFFERENTIAL/PLATELET
Basophils Absolute: 0.1 10*3/uL (ref 0–0.1)
Basophils Relative: 2 %
EOS ABS: 0.2 10*3/uL (ref 0–0.7)
EOS PCT: 10 %
HCT: 24.8 % — ABNORMAL LOW (ref 35.0–47.0)
Hemoglobin: 8.2 g/dL — ABNORMAL LOW (ref 12.0–16.0)
LYMPHS ABS: 0.9 10*3/uL — AB (ref 1.0–3.6)
Lymphocytes Relative: 42 %
MCH: 33.9 pg (ref 26.0–34.0)
MCHC: 33.2 g/dL (ref 32.0–36.0)
MCV: 102 fL — ABNORMAL HIGH (ref 80.0–100.0)
Monocytes Absolute: 0.1 10*3/uL — ABNORMAL LOW (ref 0.2–0.9)
Monocytes Relative: 4 %
Neutro Abs: 0.9 10*3/uL — ABNORMAL LOW (ref 1.4–6.5)
Neutrophils Relative %: 42 %
PLATELETS: 11 10*3/uL (ref 150–400)
RBC: 2.43 MIL/uL — AB (ref 3.80–5.20)
RDW: 19.4 % — AB (ref 11.5–14.5)
WBC: 2.2 10*3/uL — AB (ref 3.6–11.0)

## 2016-10-21 LAB — ABO/RH: ABO/RH(D): A POS

## 2016-10-21 LAB — SAMPLE TO BLOOD BANK

## 2016-10-21 MED ORDER — SODIUM CHLORIDE 0.9 % IV SOLN
250.0000 mL | Freq: Once | INTRAVENOUS | Status: DC
  Filled 2016-10-21: qty 250

## 2016-10-21 MED ORDER — SODIUM CHLORIDE 0.9% FLUSH
10.0000 mL | INTRAVENOUS | Status: DC | PRN
  Filled 2016-10-21: qty 10

## 2016-10-21 MED ORDER — HEPARIN SOD (PORK) LOCK FLUSH 100 UNIT/ML IV SOLN: 500.0000 [IU] | Freq: Every day | INTRAVENOUS | Status: DC | PRN

## 2016-10-21 MED ORDER — HEPARIN SOD (PORK) LOCK FLUSH 100 UNIT/ML IV SOLN: 250.0000 [IU] | INTRAVENOUS | Status: DC | PRN

## 2016-10-21 MED ORDER — SODIUM CHLORIDE 0.9 % IV SOLN
250.0000 mL | Freq: Once | INTRAVENOUS | Status: AC
  Administered 2016-10-21: 250 mL via INTRAVENOUS
  Filled 2016-10-21: qty 250

## 2016-10-21 MED ORDER — SODIUM CHLORIDE 0.9% FLUSH
3.0000 mL | INTRAVENOUS | Status: DC | PRN
  Filled 2016-10-21: qty 3

## 2016-10-22 ENCOUNTER — Telehealth: Payer: Self-pay | Admitting: Family Medicine

## 2016-10-22 LAB — PREPARE PLATELET PHERESIS: Unit division: 0

## 2016-10-22 NOTE — Telephone Encounter (Signed)
Called Pt to schedule AWV with [facility] - knb °

## 2016-10-28 ENCOUNTER — Inpatient Hospital Stay (HOSPITAL_BASED_OUTPATIENT_CLINIC_OR_DEPARTMENT_OTHER): Payer: Medicare Other | Admitting: Internal Medicine

## 2016-10-28 ENCOUNTER — Inpatient Hospital Stay: Payer: Medicare Other

## 2016-10-28 VITALS — BP 126/69 | HR 62 | Temp 96.8°F | Wt 239.1 lb

## 2016-10-28 DIAGNOSIS — Z7984 Long term (current) use of oral hypoglycemic drugs: Secondary | ICD-10-CM | POA: Diagnosis not present

## 2016-10-28 DIAGNOSIS — K219 Gastro-esophageal reflux disease without esophagitis: Secondary | ICD-10-CM

## 2016-10-28 DIAGNOSIS — G4730 Sleep apnea, unspecified: Secondary | ICD-10-CM | POA: Diagnosis not present

## 2016-10-28 DIAGNOSIS — D46Z Other myelodysplastic syndromes: Secondary | ICD-10-CM

## 2016-10-28 DIAGNOSIS — R231 Pallor: Secondary | ICD-10-CM

## 2016-10-28 DIAGNOSIS — E669 Obesity, unspecified: Secondary | ICD-10-CM

## 2016-10-28 DIAGNOSIS — F418 Other specified anxiety disorders: Secondary | ICD-10-CM

## 2016-10-28 DIAGNOSIS — Z5111 Encounter for antineoplastic chemotherapy: Secondary | ICD-10-CM | POA: Diagnosis not present

## 2016-10-28 DIAGNOSIS — Z87442 Personal history of urinary calculi: Secondary | ICD-10-CM | POA: Diagnosis not present

## 2016-10-28 DIAGNOSIS — R5382 Chronic fatigue, unspecified: Secondary | ICD-10-CM

## 2016-10-28 DIAGNOSIS — E1122 Type 2 diabetes mellitus with diabetic chronic kidney disease: Secondary | ICD-10-CM | POA: Diagnosis not present

## 2016-10-28 DIAGNOSIS — R785 Finding of other psychotropic drug in blood: Secondary | ICD-10-CM

## 2016-10-28 DIAGNOSIS — I129 Hypertensive chronic kidney disease with stage 1 through stage 4 chronic kidney disease, or unspecified chronic kidney disease: Secondary | ICD-10-CM | POA: Diagnosis not present

## 2016-10-28 DIAGNOSIS — N183 Chronic kidney disease, stage 3 (moderate): Secondary | ICD-10-CM

## 2016-10-28 DIAGNOSIS — Z79899 Other long term (current) drug therapy: Secondary | ICD-10-CM

## 2016-10-28 DIAGNOSIS — E785 Hyperlipidemia, unspecified: Secondary | ICD-10-CM | POA: Diagnosis not present

## 2016-10-28 DIAGNOSIS — Z87891 Personal history of nicotine dependence: Secondary | ICD-10-CM

## 2016-10-28 LAB — COMPREHENSIVE METABOLIC PANEL
ALK PHOS: 77 U/L (ref 38–126)
ALT: 19 U/L (ref 14–54)
ANION GAP: 6 (ref 5–15)
AST: 17 U/L (ref 15–41)
Albumin: 3.7 g/dL (ref 3.5–5.0)
BUN: 24 mg/dL — ABNORMAL HIGH (ref 6–20)
CALCIUM: 9.1 mg/dL (ref 8.9–10.3)
CHLORIDE: 111 mmol/L (ref 101–111)
CO2: 24 mmol/L (ref 22–32)
Creatinine, Ser: 1 mg/dL (ref 0.44–1.00)
GFR, EST NON AFRICAN AMERICAN: 54 mL/min — AB (ref >60)
Glucose, Bld: 135 mg/dL — ABNORMAL HIGH (ref 65–99)
Potassium: 4.4 mmol/L (ref 3.5–5.1)
SODIUM: 141 mmol/L (ref 135–145)
Total Bilirubin: 0.4 mg/dL (ref 0.3–1.2)
Total Protein: 7.2 g/dL (ref 6.5–8.1)

## 2016-10-28 LAB — CBC WITH DIFFERENTIAL/PLATELET
Basophils Absolute: 0 10*3/uL (ref 0–0.1)
Basophils Relative: 1 %
EOS ABS: 0 10*3/uL (ref 0–0.7)
EOS PCT: 0 %
HCT: 23.4 % — ABNORMAL LOW (ref 35.0–47.0)
Hemoglobin: 7.9 g/dL — ABNORMAL LOW (ref 12.0–16.0)
LYMPHS ABS: 1.1 10*3/uL (ref 1.0–3.6)
Lymphocytes Relative: 48 %
MCH: 34.3 pg — AB (ref 26.0–34.0)
MCHC: 33.8 g/dL (ref 32.0–36.0)
MCV: 101.5 fL — ABNORMAL HIGH (ref 80.0–100.0)
MONO ABS: 0.1 10*3/uL — AB (ref 0.2–0.9)
MONOS PCT: 6 %
Neutro Abs: 1 10*3/uL — ABNORMAL LOW (ref 1.4–6.5)
Neutrophils Relative %: 45 %
PLATELETS: 49 10*3/uL — AB (ref 150–440)
RBC: 2.31 MIL/uL — ABNORMAL LOW (ref 3.80–5.20)
RDW: 19.1 % — ABNORMAL HIGH (ref 11.5–14.5)
WBC: 2.3 10*3/uL — ABNORMAL LOW (ref 3.6–11.0)

## 2016-10-28 LAB — SAMPLE TO BLOOD BANK

## 2016-10-28 NOTE — Progress Notes (Signed)
Patient here today for follow up.  Patient states no new concerns today  

## 2016-10-28 NOTE — Progress Notes (Signed)
[place] NOTE  Patient Care Team: Roselee Nova, [name]  CHIEF COMPLAINTS/PURPOSE OF CONSULTATION:   Oncology History   #JUNE 2017- Severe neutropenia/ Anemia ~hb 9/platlets- 85-100 AUG 2017- REFRACTORY ANEMIA with EXCESS BLASTS [14% blasts- BMBx]; cytogenetics/FISH-N [SNP micorarray- not done]   # AUG 21st-  START AZA 75mg /m2 day- 1-7 q 28 days x4 cycles; DEC 6th- BMBx- <5% blasts; hypercellular with dysplasia.   # CKD stage III     MDS (myelodysplastic syndrome), high grade ([place])   04/23/2016 Initial Diagnosis    MDS (myelodysplastic syndrome), high grade (HCC)         HISTORY OF PRESENTING ILLNESS:  Teresa Carrillo 76 y.o.  female with Recently diagnosed MDS/high-grade currently started on Vidaza Status post cycle #7 approximately 4 weeks ago is here for follow-up and further treatment.  Pt needed platelet transfusion for platelets of 11 last week/had nose bleeds.   Patient has not had any PRBC transfusion transfusion.  Currently no bleeding noted. No fevers. No chills. Appetite is good. No weight loss.   Patient denies any nausea vomiting. Continues to chronic fatigue. Not any worse.  ROS: A c is done which is negative except mentioned above in history of present illness  MEDICAL HISTORY:  Past Medical History:  Diagnosis Date  . Abdominal wall mass   . Anginal pain ([place])   . Anxiety   . Arthritis   . Calculus of kidney   . Cystitis   . Depression   . Diabetes mellitus without complication (HCC)    elevated A1c  . Dyspnea on exertion   . Elevated serum creatinine   . Fibrocystic breast disease   . GERD (gastroesophageal reflux disease)   . Hearing loss   . Heart murmur   . HTN (hypertension)   . Hyperlipidemia   . MDS (myelodysplastic syndrome), high grade ([place]) 04/23/2016  . Microscopic hematuria   . Mucositis due to chemotherapy   . Obesity   . Risk for falls   . Sleep apnea   .  Thrombocytopenia ([place])   . Urinary frequency   . Urinary urgency     SURGICAL HISTORY: Past Surgical History:  Procedure Laterality Date  . ABDOMINAL HYSTERECTOMY    . APPENDECTOMY    . CARDIAC CATHETERIZATION     x2  . CHOLECYSTECTOMY    . COLONOSCOPY N/A 02/24/2015   Procedure: COLONOSCOPY;  Surgeon: Manya Silvas, MD;  Location: [facility] ENDOSCOPY;  Service: Endoscopy;  Laterality: N/A;  . DIAGNOSTIC LAPAROSCOPY     Removal of benign abdominal tumor  . ESOPHAGOGASTRODUODENOSCOPY N/A 02/24/2015   Procedure: ESOPHAGOGASTRODUODENOSCOPY (EGD);  Surgeon: Manya Silvas, MD;  Location: [facility] ENDOSCOPY;  Service: Endoscopy;  Laterality: N/A;  . 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 No smoking/ no alcohol.  Social History   Social History  . Marital status: Married    Spouse name: N/A  . Number of children: N/A  . Years of education: N/A   Occupational History  . Not on file.   Social History Main Topics  . Smoking status: Former Smoker    Quit date: 09/09/1988  . Smokeless tobacco: Never Used     Comment: quit 25 years ago  . Alcohol use No  . Drug use: No  . Sexual activity: Not on file   Other Topics Concern  . Not on file   Social  History Narrative  . No narrative on file    FAMILY HISTORY: no cancers in family.  Family History  Problem Relation Age of Onset  . Congestive Heart Failure Mother   . Diabetes Mother   . Coronary artery disease Mother   . Stroke Mother   . Cirrhosis Father     ALLERGIES:  is allergic to macrobid [nitrofurantoin monohyd macro].  MEDICATIONS:  Current Outpatient Prescriptions  Medication Sig Dispense Refill  . acyclovir (ZOVIRAX) 400 MG tablet Take 1 tablet (400 mg total) by mouth 2 (two) times daily. 120 tablet 2  . buPROPion (WELLBUTRIN XL) 150 MG 24 hr tablet Take 300 mg by mouth daily at 12 noon.     . clonazePAM (KLONOPIN) 0.5 MG tablet Take 0.5 mg by mouth 2  (two) times daily.     . hydrocortisone (ANUSOL-HC) 2.5 % rectal cream Place 1 application rectally 2 (two) times daily as needed for hemorrhoids or itching. 30 g 0  . LUMIGAN 0.01 % SOLN Place 1 drop into both eyes at bedtime.     . magic mouthwash w/lidocaine SOLN Take 5 mLs by mouth 4 (four) times daily. 80 ml viscous lidocaine 2%, 80 ml Mylanta, 80 ml Diphenhydramine 12.5 mg/5 ml Elixir, 80 ml Nystatin 100,000 Unit suspension, 80 ml Prednisolone 15 mg/63ml, 80 ml Distilled Water.  Sig: Swish/Swallow 5-10 ml four times a day as needed. Dispense 480 ml. 3RFs 480 mL 3  . metFORMIN (GLUCOPHAGE) 500 MG tablet TAKE 1 TABLET BY MOUTH DAILY 90 tablet 0  . Multiple Vitamin (MULTIVITAMIN WITH MINERALS) TABS tablet Take 1 tablet by mouth daily.    . Omega-3 Fatty Acids (FISH OIL) 1000 MG CAPS Take 1 capsule by mouth daily.    . ondansetron (ZOFRAN-ODT) 4 MG disintegrating tablet Take 4 mg by mouth every 8 (eight) hours as needed for nausea or vomiting.    . polyethylene glycol (MIRALAX / GLYCOLAX) packet Take 17 g by mouth daily as needed for mild constipation. 14 each 1  . prochlorperazine (COMPAZINE) 10 MG tablet Take 1 tablet (10 mg total) by mouth every 6 (six) hours as needed (Nausea or vomiting). 30 tablet 1  . rosuvastatin (CRESTOR) 20 MG tablet TAKE 1 TABLET(20 MG) BY MOUTH DAILY 90 tablet 0  . sertraline (ZOLOFT) 100 MG tablet Take 100 mg by mouth daily at 12 noon.     Marland Kitchen Umeclidinium-Vilanterol (ANORO ELLIPTA) 62.5-25 MCG/INH AEPB Inhale 1 puff into the lungs daily as needed (shortness of breath).      No current facility-administered medications for this visit.       Marland Kitchen  PHYSICAL EXAMINATION: ECOG PERFORMANCE STATUS: 1 - Symptomatic but completely ambulatory  Vitals:   10/28/16 1053  BP: 126/69  Pulse: 62  Temp: (!) 96.8 F (36 C)   Filed Weights   10/28/16 1053  Weight: 239 lb 2 oz (108.5 kg)    GENERAL: Well-nourished well-developed; Alert, no distress and comfortable.   Obese.  Accompanied by her /husband. She is in a wheel chair.  EYES: Positive for pallor. OROPHARYNX: no thrush or ulceration;  NECK: supple, no masses felt LYMPH:  no palpable lymphadenopathy in the cervical, axillary or inguinal regions LUNGS: clear to auscultation and  No wheeze or crackles HEART/CVS: regular rate & rhythm and no murmurs; No lower extremity edema ABDOMEN: abdomen soft, non-tender and normal bowel sounds Musculoskeletal:no cyanosis of digits and no clubbing  PSYCH: alert & oriented x 3 with fluent speech NEURO: no focal motor/sensory deficits SKIN:  ablation noted on the right leg /improving no signs of infection.   LABORATORY DATA:  I have reviewed the data as listed Lab Results  Component Value Date   WBC 2.3 (L) 10/28/2016   HGB 7.9 (L) 10/28/2016   HCT 23.4 (L) 10/28/2016   MCV 101.5 (H) 10/28/2016   PLT 49 (L) 10/28/2016    Recent Labs  05/07/16 1652  09/10/16 1110 09/30/16 0942 10/28/16 0936  NA  --   < > 138 139 141  K  --   < > 4.1 4.9 4.4  CL  --   < > 104 105 111  CO2  --   < > 26 28 24   GLUCOSE  --   < > 167* 125* 135*  BUN  --   < > 22* 30* 24*  CREATININE  --   < > 0.99 1.21* 1.00  CALCIUM  --   < > 9.0 9.5 9.1  GFRNONAA  --   < > 54* 43* 54*  GFRAA  --   < > >60 49* >60  PROT 8.3*  < > 7.4 7.7 7.2  ALBUMIN 3.8  < > 3.9 4.1 3.7  AST 26  < > 24 24 17   ALT 25  < > 23 27 19   ALKPHOS 65  < > 77 77 77  BILITOT 0.6  < > 0.4 0.3 0.4  BILIDIR 0.1  --   --   --   --   IBILI 0.5  --   --   --   --   < > = values in this interval not displayed.  RADIOGRAPHIC STUDIES: I have personally reviewed the radiological images as listed and agreed with the findings in the report. No results found.  ASSESSMENT & PLAN:   MDS (myelodysplastic syndrome), high grade (HCC) Refractory anemia- with excess blasts- II [blasts percentage 14%] FISH- normal karyotype. On vidaza; s/p  cycle # 5 BMX- Partial response noted < 5% blasts noted; continued hyper-cellular bone  marrow; dysplastic changes. Patient is currently status post 6 cycles. Patient tolerated treatment well except for anemia/thrombocytopenia [C discussion below]  # Today Hemoglobin 7.9 platelets 49. ANC 1.1; HOLD cycle #7 today. ? From vidaza vs progression of disease. Will again need Bmbx in appx 2 months or so.   #CKD stage III-stable.  # Prophylactic antibiotics- acyclovir/ diflucan.    # follow up with cycle # 7 in 1 weeks/cbc/treatment day-1-7.   # I did discussed with the patient's family regarding the incurable nature of the disease. The treatments are palliative. In general the median Life expectancy of high-grade MDS is approximately one year or so.    # follow up in cbc/bmp/ in 3 weeks/ no chemo.      Cammie Sickle, MD 10/28/2016 4:39 PM

## 2016-10-28 NOTE — Assessment & Plan Note (Addendum)
Refractory anemia- with excess blasts- II [blasts percentage 14%] FISH- normal karyotype. On vidaza; s/p  cycle # 5 BMX- Partial response noted < 5% blasts noted; continued hyper-cellular bone marrow; dysplastic changes. Patient is currently status post 6 cycles. Patient tolerated treatment well except for anemia/thrombocytopenia [C discussion below]  # Today Hemoglobin 7.9 platelets 49. ANC 1.1; HOLD cycle #7 today. ? From vidaza vs progression of disease. Will again need Bmbx in appx 2 months or so.   #CKD stage III-stable.  # Prophylactic antibiotics- acyclovir/ diflucan.    # follow up with cycle # 7 in 1 weeks/cbc/treatment day-1-7.   # I did discussed with the patient's family regarding the incurable nature of the disease. The treatments are palliative. In general the median Life expectancy of high-grade MDS is approximately one year or so.    # follow up in cbc/bmp/ in 3 weeks/ no chemo.

## 2016-11-04 ENCOUNTER — Inpatient Hospital Stay: Payer: Medicare Other

## 2016-11-04 ENCOUNTER — Telehealth: Payer: Self-pay | Admitting: Internal Medicine

## 2016-11-04 ENCOUNTER — Other Ambulatory Visit: Payer: Self-pay | Admitting: Internal Medicine

## 2016-11-04 VITALS — BP 137/73 | HR 67 | Temp 98.6°F | Resp 18

## 2016-11-04 DIAGNOSIS — N183 Chronic kidney disease, stage 3 (moderate): Secondary | ICD-10-CM | POA: Diagnosis not present

## 2016-11-04 DIAGNOSIS — Z79899 Other long term (current) drug therapy: Secondary | ICD-10-CM | POA: Diagnosis not present

## 2016-11-04 DIAGNOSIS — E785 Hyperlipidemia, unspecified: Secondary | ICD-10-CM | POA: Diagnosis not present

## 2016-11-04 DIAGNOSIS — Z87891 Personal history of nicotine dependence: Secondary | ICD-10-CM | POA: Diagnosis not present

## 2016-11-04 DIAGNOSIS — R231 Pallor: Secondary | ICD-10-CM | POA: Diagnosis not present

## 2016-11-04 DIAGNOSIS — D46Z Other myelodysplastic syndromes: Secondary | ICD-10-CM | POA: Diagnosis not present

## 2016-11-04 DIAGNOSIS — G4730 Sleep apnea, unspecified: Secondary | ICD-10-CM | POA: Diagnosis not present

## 2016-11-04 DIAGNOSIS — Z5111 Encounter for antineoplastic chemotherapy: Secondary | ICD-10-CM | POA: Diagnosis not present

## 2016-11-04 DIAGNOSIS — I129 Hypertensive chronic kidney disease with stage 1 through stage 4 chronic kidney disease, or unspecified chronic kidney disease: Secondary | ICD-10-CM | POA: Diagnosis not present

## 2016-11-04 DIAGNOSIS — Z7984 Long term (current) use of oral hypoglycemic drugs: Secondary | ICD-10-CM | POA: Diagnosis not present

## 2016-11-04 DIAGNOSIS — K219 Gastro-esophageal reflux disease without esophagitis: Secondary | ICD-10-CM | POA: Diagnosis not present

## 2016-11-04 DIAGNOSIS — Z87442 Personal history of urinary calculi: Secondary | ICD-10-CM | POA: Diagnosis not present

## 2016-11-04 DIAGNOSIS — R5382 Chronic fatigue, unspecified: Secondary | ICD-10-CM | POA: Diagnosis not present

## 2016-11-04 DIAGNOSIS — E1122 Type 2 diabetes mellitus with diabetic chronic kidney disease: Secondary | ICD-10-CM | POA: Diagnosis not present

## 2016-11-04 LAB — CBC WITH DIFFERENTIAL/PLATELET
Basophils Absolute: 0 10*3/uL (ref 0–0.1)
Basophils Relative: 1 %
EOS ABS: 0 10*3/uL (ref 0–0.7)
EOS PCT: 0 %
HCT: 25.2 % — ABNORMAL LOW (ref 35.0–47.0)
Hemoglobin: 8.4 g/dL — ABNORMAL LOW (ref 12.0–16.0)
LYMPHS ABS: 1.2 10*3/uL (ref 1.0–3.6)
LYMPHS PCT: 41 %
MCH: 33.6 pg (ref 26.0–34.0)
MCHC: 33.3 g/dL (ref 32.0–36.0)
MCV: 100.8 fL — AB (ref 80.0–100.0)
MONOS PCT: 5 %
Monocytes Absolute: 0.1 10*3/uL — ABNORMAL LOW (ref 0.2–0.9)
Neutro Abs: 1.5 10*3/uL (ref 1.4–6.5)
Neutrophils Relative %: 53 %
PLATELETS: 109 10*3/uL — AB (ref 150–440)
RBC: 2.5 MIL/uL — AB (ref 3.80–5.20)
RDW: 19.2 % — ABNORMAL HIGH (ref 11.5–14.5)
WBC: 2.9 10*3/uL — ABNORMAL LOW (ref 3.6–11.0)

## 2016-11-04 LAB — SAMPLE TO BLOOD BANK

## 2016-11-04 MED ORDER — ONDANSETRON HCL 4 MG PO TABS
8.0000 mg | ORAL_TABLET | Freq: Once | ORAL | Status: AC
  Administered 2016-11-04: 8 mg via ORAL
  Filled 2016-11-04: qty 2

## 2016-11-04 MED ORDER — AZACITIDINE CHEMO SQ INJECTION
130.0000 mg | Freq: Once | INTRAMUSCULAR | Status: AC
  Administered 2016-11-04: 130 mg via SUBCUTANEOUS
  Filled 2016-11-04: qty 5.2

## 2016-11-04 NOTE — Telephone Encounter (Signed)
FYI- Cut dose of vidaza to 60mg /m2. Discussed with Myra. Otherwise okay for chemo today.

## 2016-11-05 ENCOUNTER — Other Ambulatory Visit: Payer: Self-pay | Admitting: Internal Medicine

## 2016-11-05 ENCOUNTER — Inpatient Hospital Stay: Payer: Medicare Other

## 2016-11-05 VITALS — BP 122/57 | HR 65 | Temp 98.1°F | Resp 20

## 2016-11-05 DIAGNOSIS — Z5111 Encounter for antineoplastic chemotherapy: Secondary | ICD-10-CM | POA: Diagnosis not present

## 2016-11-05 DIAGNOSIS — E785 Hyperlipidemia, unspecified: Secondary | ICD-10-CM | POA: Diagnosis not present

## 2016-11-05 DIAGNOSIS — D46Z Other myelodysplastic syndromes: Secondary | ICD-10-CM

## 2016-11-05 DIAGNOSIS — Z79899 Other long term (current) drug therapy: Secondary | ICD-10-CM | POA: Diagnosis not present

## 2016-11-05 DIAGNOSIS — K219 Gastro-esophageal reflux disease without esophagitis: Secondary | ICD-10-CM | POA: Diagnosis not present

## 2016-11-05 DIAGNOSIS — Z87891 Personal history of nicotine dependence: Secondary | ICD-10-CM | POA: Diagnosis not present

## 2016-11-05 DIAGNOSIS — G4730 Sleep apnea, unspecified: Secondary | ICD-10-CM | POA: Diagnosis not present

## 2016-11-05 DIAGNOSIS — Z87442 Personal history of urinary calculi: Secondary | ICD-10-CM | POA: Diagnosis not present

## 2016-11-05 DIAGNOSIS — Z7984 Long term (current) use of oral hypoglycemic drugs: Secondary | ICD-10-CM | POA: Diagnosis not present

## 2016-11-05 DIAGNOSIS — E1122 Type 2 diabetes mellitus with diabetic chronic kidney disease: Secondary | ICD-10-CM | POA: Diagnosis not present

## 2016-11-05 DIAGNOSIS — R5382 Chronic fatigue, unspecified: Secondary | ICD-10-CM | POA: Diagnosis not present

## 2016-11-05 DIAGNOSIS — R231 Pallor: Secondary | ICD-10-CM | POA: Diagnosis not present

## 2016-11-05 DIAGNOSIS — N183 Chronic kidney disease, stage 3 (moderate): Secondary | ICD-10-CM | POA: Diagnosis not present

## 2016-11-05 DIAGNOSIS — I129 Hypertensive chronic kidney disease with stage 1 through stage 4 chronic kidney disease, or unspecified chronic kidney disease: Secondary | ICD-10-CM | POA: Diagnosis not present

## 2016-11-05 MED ORDER — ONDANSETRON HCL 4 MG PO TABS
8.0000 mg | ORAL_TABLET | Freq: Once | ORAL | Status: AC
  Administered 2016-11-05: 8 mg via ORAL
  Filled 2016-11-05: qty 2

## 2016-11-05 MED ORDER — AZACITIDINE CHEMO SQ INJECTION
130.0000 mg | Freq: Once | INTRAMUSCULAR | Status: AC
  Administered 2016-11-05: 130 mg via SUBCUTANEOUS
  Filled 2016-11-05: qty 5.2

## 2016-11-06 ENCOUNTER — Inpatient Hospital Stay: Payer: Medicare Other

## 2016-11-06 VITALS — BP 124/62 | HR 68 | Temp 98.1°F | Resp 20

## 2016-11-06 DIAGNOSIS — N183 Chronic kidney disease, stage 3 (moderate): Secondary | ICD-10-CM | POA: Diagnosis not present

## 2016-11-06 DIAGNOSIS — D46Z Other myelodysplastic syndromes: Secondary | ICD-10-CM | POA: Diagnosis not present

## 2016-11-06 DIAGNOSIS — E785 Hyperlipidemia, unspecified: Secondary | ICD-10-CM | POA: Diagnosis not present

## 2016-11-06 DIAGNOSIS — Z87442 Personal history of urinary calculi: Secondary | ICD-10-CM | POA: Diagnosis not present

## 2016-11-06 DIAGNOSIS — Z5111 Encounter for antineoplastic chemotherapy: Secondary | ICD-10-CM | POA: Diagnosis not present

## 2016-11-06 DIAGNOSIS — E1122 Type 2 diabetes mellitus with diabetic chronic kidney disease: Secondary | ICD-10-CM | POA: Diagnosis not present

## 2016-11-06 DIAGNOSIS — Z7984 Long term (current) use of oral hypoglycemic drugs: Secondary | ICD-10-CM | POA: Diagnosis not present

## 2016-11-06 DIAGNOSIS — R5382 Chronic fatigue, unspecified: Secondary | ICD-10-CM | POA: Diagnosis not present

## 2016-11-06 DIAGNOSIS — Z87891 Personal history of nicotine dependence: Secondary | ICD-10-CM | POA: Diagnosis not present

## 2016-11-06 DIAGNOSIS — K219 Gastro-esophageal reflux disease without esophagitis: Secondary | ICD-10-CM | POA: Diagnosis not present

## 2016-11-06 DIAGNOSIS — G4730 Sleep apnea, unspecified: Secondary | ICD-10-CM | POA: Diagnosis not present

## 2016-11-06 DIAGNOSIS — R231 Pallor: Secondary | ICD-10-CM | POA: Diagnosis not present

## 2016-11-06 DIAGNOSIS — I129 Hypertensive chronic kidney disease with stage 1 through stage 4 chronic kidney disease, or unspecified chronic kidney disease: Secondary | ICD-10-CM | POA: Diagnosis not present

## 2016-11-06 DIAGNOSIS — Z79899 Other long term (current) drug therapy: Secondary | ICD-10-CM | POA: Diagnosis not present

## 2016-11-06 MED ORDER — ONDANSETRON HCL 4 MG PO TABS
8.0000 mg | ORAL_TABLET | Freq: Once | ORAL | Status: AC
Start: 2016-11-06 — End: 2016-11-06
  Administered 2016-11-06: 8 mg via ORAL
  Filled 2016-11-06: qty 2

## 2016-11-06 MED ORDER — AZACITIDINE CHEMO SQ INJECTION
130.0000 mg | Freq: Once | INTRAMUSCULAR | Status: AC
  Administered 2016-11-06: 130 mg via SUBCUTANEOUS
  Filled 2016-11-06: qty 5.2

## 2016-11-07 ENCOUNTER — Inpatient Hospital Stay: Payer: Medicare Other | Attending: Hematology and Oncology

## 2016-11-07 VITALS — BP 121/73 | HR 82 | Temp 97.0°F | Resp 20

## 2016-11-07 DIAGNOSIS — Z7984 Long term (current) use of oral hypoglycemic drugs: Secondary | ICD-10-CM | POA: Insufficient documentation

## 2016-11-07 DIAGNOSIS — Z792 Long term (current) use of antibiotics: Secondary | ICD-10-CM | POA: Insufficient documentation

## 2016-11-07 DIAGNOSIS — E1122 Type 2 diabetes mellitus with diabetic chronic kidney disease: Secondary | ICD-10-CM | POA: Insufficient documentation

## 2016-11-07 DIAGNOSIS — D46Z Other myelodysplastic syndromes: Secondary | ICD-10-CM | POA: Insufficient documentation

## 2016-11-07 DIAGNOSIS — Z5111 Encounter for antineoplastic chemotherapy: Secondary | ICD-10-CM | POA: Diagnosis not present

## 2016-11-07 DIAGNOSIS — E785 Hyperlipidemia, unspecified: Secondary | ICD-10-CM | POA: Diagnosis not present

## 2016-11-07 DIAGNOSIS — N183 Chronic kidney disease, stage 3 (moderate): Secondary | ICD-10-CM | POA: Insufficient documentation

## 2016-11-07 DIAGNOSIS — Z79899 Other long term (current) drug therapy: Secondary | ICD-10-CM | POA: Insufficient documentation

## 2016-11-07 DIAGNOSIS — R231 Pallor: Secondary | ICD-10-CM | POA: Diagnosis not present

## 2016-11-07 DIAGNOSIS — I129 Hypertensive chronic kidney disease with stage 1 through stage 4 chronic kidney disease, or unspecified chronic kidney disease: Secondary | ICD-10-CM | POA: Insufficient documentation

## 2016-11-07 DIAGNOSIS — R5382 Chronic fatigue, unspecified: Secondary | ICD-10-CM | POA: Diagnosis not present

## 2016-11-07 DIAGNOSIS — K219 Gastro-esophageal reflux disease without esophagitis: Secondary | ICD-10-CM | POA: Insufficient documentation

## 2016-11-07 DIAGNOSIS — Z87891 Personal history of nicotine dependence: Secondary | ICD-10-CM | POA: Insufficient documentation

## 2016-11-07 MED ORDER — ONDANSETRON HCL 4 MG PO TABS
8.0000 mg | ORAL_TABLET | Freq: Once | ORAL | Status: AC
  Administered 2016-11-07: 8 mg via ORAL
  Filled 2016-11-07: qty 2

## 2016-11-07 MED ORDER — AZACITIDINE CHEMO SQ INJECTION
130.0000 mg | Freq: Once | INTRAMUSCULAR | Status: AC
  Administered 2016-11-07: 130 mg via SUBCUTANEOUS
  Filled 2016-11-07: qty 5.2

## 2016-11-08 ENCOUNTER — Inpatient Hospital Stay: Payer: Medicare Other

## 2016-11-08 VITALS — BP 132/77 | HR 84 | Temp 97.9°F | Resp 20

## 2016-11-08 DIAGNOSIS — Z5111 Encounter for antineoplastic chemotherapy: Secondary | ICD-10-CM | POA: Diagnosis not present

## 2016-11-08 DIAGNOSIS — Z7984 Long term (current) use of oral hypoglycemic drugs: Secondary | ICD-10-CM | POA: Diagnosis not present

## 2016-11-08 DIAGNOSIS — R231 Pallor: Secondary | ICD-10-CM | POA: Diagnosis not present

## 2016-11-08 DIAGNOSIS — E1122 Type 2 diabetes mellitus with diabetic chronic kidney disease: Secondary | ICD-10-CM | POA: Diagnosis not present

## 2016-11-08 DIAGNOSIS — Z87891 Personal history of nicotine dependence: Secondary | ICD-10-CM | POA: Diagnosis not present

## 2016-11-08 DIAGNOSIS — Z79899 Other long term (current) drug therapy: Secondary | ICD-10-CM | POA: Diagnosis not present

## 2016-11-08 DIAGNOSIS — D46Z Other myelodysplastic syndromes: Secondary | ICD-10-CM | POA: Diagnosis not present

## 2016-11-08 DIAGNOSIS — K219 Gastro-esophageal reflux disease without esophagitis: Secondary | ICD-10-CM | POA: Diagnosis not present

## 2016-11-08 DIAGNOSIS — N183 Chronic kidney disease, stage 3 (moderate): Secondary | ICD-10-CM | POA: Diagnosis not present

## 2016-11-08 DIAGNOSIS — E785 Hyperlipidemia, unspecified: Secondary | ICD-10-CM | POA: Diagnosis not present

## 2016-11-08 DIAGNOSIS — Z792 Long term (current) use of antibiotics: Secondary | ICD-10-CM | POA: Diagnosis not present

## 2016-11-08 DIAGNOSIS — R5382 Chronic fatigue, unspecified: Secondary | ICD-10-CM | POA: Diagnosis not present

## 2016-11-08 DIAGNOSIS — I129 Hypertensive chronic kidney disease with stage 1 through stage 4 chronic kidney disease, or unspecified chronic kidney disease: Secondary | ICD-10-CM | POA: Diagnosis not present

## 2016-11-08 MED ORDER — ONDANSETRON HCL 4 MG PO TABS
8.0000 mg | ORAL_TABLET | Freq: Once | ORAL | Status: AC
  Administered 2016-11-08: 8 mg via ORAL
  Filled 2016-11-08: qty 2

## 2016-11-08 MED ORDER — AZACITIDINE CHEMO SQ INJECTION
130.0000 mg | Freq: Once | INTRAMUSCULAR | Status: AC
  Administered 2016-11-08: 130 mg via SUBCUTANEOUS
  Filled 2016-11-08: qty 5.2

## 2016-11-11 ENCOUNTER — Inpatient Hospital Stay: Payer: Medicare Other | Admitting: *Deleted

## 2016-11-11 ENCOUNTER — Inpatient Hospital Stay: Payer: Medicare Other

## 2016-11-11 VITALS — BP 134/78 | HR 82 | Temp 97.1°F | Resp 20

## 2016-11-11 DIAGNOSIS — D46Z Other myelodysplastic syndromes: Secondary | ICD-10-CM | POA: Diagnosis not present

## 2016-11-11 DIAGNOSIS — R231 Pallor: Secondary | ICD-10-CM | POA: Diagnosis not present

## 2016-11-11 DIAGNOSIS — Z5111 Encounter for antineoplastic chemotherapy: Secondary | ICD-10-CM | POA: Diagnosis not present

## 2016-11-11 DIAGNOSIS — I129 Hypertensive chronic kidney disease with stage 1 through stage 4 chronic kidney disease, or unspecified chronic kidney disease: Secondary | ICD-10-CM | POA: Diagnosis not present

## 2016-11-11 DIAGNOSIS — Z87891 Personal history of nicotine dependence: Secondary | ICD-10-CM | POA: Diagnosis not present

## 2016-11-11 DIAGNOSIS — E785 Hyperlipidemia, unspecified: Secondary | ICD-10-CM | POA: Diagnosis not present

## 2016-11-11 DIAGNOSIS — N183 Chronic kidney disease, stage 3 (moderate): Secondary | ICD-10-CM | POA: Diagnosis not present

## 2016-11-11 DIAGNOSIS — Z792 Long term (current) use of antibiotics: Secondary | ICD-10-CM | POA: Diagnosis not present

## 2016-11-11 DIAGNOSIS — R5382 Chronic fatigue, unspecified: Secondary | ICD-10-CM | POA: Diagnosis not present

## 2016-11-11 DIAGNOSIS — Z7984 Long term (current) use of oral hypoglycemic drugs: Secondary | ICD-10-CM | POA: Diagnosis not present

## 2016-11-11 DIAGNOSIS — Z79899 Other long term (current) drug therapy: Secondary | ICD-10-CM | POA: Diagnosis not present

## 2016-11-11 DIAGNOSIS — K219 Gastro-esophageal reflux disease without esophagitis: Secondary | ICD-10-CM | POA: Diagnosis not present

## 2016-11-11 DIAGNOSIS — E1122 Type 2 diabetes mellitus with diabetic chronic kidney disease: Secondary | ICD-10-CM | POA: Diagnosis not present

## 2016-11-11 LAB — CBC WITH DIFFERENTIAL/PLATELET
Basophils Absolute: 0 10*3/uL (ref 0–0.1)
Basophils Relative: 1 %
EOS PCT: 3 %
Eosinophils Absolute: 0 10*3/uL (ref 0–0.7)
HEMATOCRIT: 24.1 % — AB (ref 35.0–47.0)
Hemoglobin: 8 g/dL — ABNORMAL LOW (ref 12.0–16.0)
LYMPHS PCT: 44 %
Lymphs Abs: 0.9 10*3/uL — ABNORMAL LOW (ref 1.0–3.6)
MCH: 33.3 pg (ref 26.0–34.0)
MCHC: 33.3 g/dL (ref 32.0–36.0)
MCV: 100.1 fL — AB (ref 80.0–100.0)
MONO ABS: 0.1 10*3/uL — AB (ref 0.2–0.9)
MONOS PCT: 8 %
NEUTROS ABS: 0.8 10*3/uL — AB (ref 1.4–6.5)
Neutrophils Relative %: 44 %
PLATELETS: 115 10*3/uL — AB (ref 150–440)
RBC: 2.41 MIL/uL — ABNORMAL LOW (ref 3.80–5.20)
RDW: 18.3 % — AB (ref 11.5–14.5)
WBC: 1.9 10*3/uL — ABNORMAL LOW (ref 3.6–11.0)

## 2016-11-11 LAB — BASIC METABOLIC PANEL
Anion gap: 6 (ref 5–15)
BUN: 17 mg/dL (ref 6–20)
CHLORIDE: 105 mmol/L (ref 101–111)
CO2: 30 mmol/L (ref 22–32)
CREATININE: 0.81 mg/dL (ref 0.44–1.00)
Calcium: 9 mg/dL (ref 8.9–10.3)
GFR calc non Af Amer: >60 mL/min (ref >60)
Glucose, Bld: 139 mg/dL — ABNORMAL HIGH (ref 65–99)
Potassium: 4.4 mmol/L (ref 3.5–5.1)
SODIUM: 141 mmol/L (ref 135–145)

## 2016-11-11 LAB — SAMPLE TO BLOOD BANK

## 2016-11-11 MED ORDER — AZACITIDINE CHEMO SQ INJECTION
130.0000 mg | Freq: Once | INTRAMUSCULAR | Status: AC
  Administered 2016-11-11: 130 mg via SUBCUTANEOUS
  Filled 2016-11-11: qty 5.2

## 2016-11-11 MED ORDER — ONDANSETRON HCL 4 MG PO TABS
8.0000 mg | ORAL_TABLET | Freq: Once | ORAL | Status: AC
  Administered 2016-11-11: 8 mg via ORAL
  Filled 2016-11-11: qty 2

## 2016-11-11 NOTE — Progress Notes (Signed)
Per Dr. Yevette Edwards, proceed with Vidaza today. LJ

## 2016-11-12 ENCOUNTER — Inpatient Hospital Stay: Payer: Medicare Other

## 2016-11-12 VITALS — BP 124/77 | HR 78 | Temp 98.7°F | Resp 20

## 2016-11-12 DIAGNOSIS — D46Z Other myelodysplastic syndromes: Secondary | ICD-10-CM | POA: Diagnosis not present

## 2016-11-12 DIAGNOSIS — R231 Pallor: Secondary | ICD-10-CM | POA: Diagnosis not present

## 2016-11-12 DIAGNOSIS — I129 Hypertensive chronic kidney disease with stage 1 through stage 4 chronic kidney disease, or unspecified chronic kidney disease: Secondary | ICD-10-CM | POA: Diagnosis not present

## 2016-11-12 DIAGNOSIS — E785 Hyperlipidemia, unspecified: Secondary | ICD-10-CM | POA: Diagnosis not present

## 2016-11-12 DIAGNOSIS — Z79899 Other long term (current) drug therapy: Secondary | ICD-10-CM | POA: Diagnosis not present

## 2016-11-12 DIAGNOSIS — E1122 Type 2 diabetes mellitus with diabetic chronic kidney disease: Secondary | ICD-10-CM | POA: Diagnosis not present

## 2016-11-12 DIAGNOSIS — Z87891 Personal history of nicotine dependence: Secondary | ICD-10-CM | POA: Diagnosis not present

## 2016-11-12 DIAGNOSIS — N183 Chronic kidney disease, stage 3 (moderate): Secondary | ICD-10-CM | POA: Diagnosis not present

## 2016-11-12 DIAGNOSIS — Z7984 Long term (current) use of oral hypoglycemic drugs: Secondary | ICD-10-CM | POA: Diagnosis not present

## 2016-11-12 DIAGNOSIS — Z5111 Encounter for antineoplastic chemotherapy: Secondary | ICD-10-CM | POA: Diagnosis not present

## 2016-11-12 DIAGNOSIS — K219 Gastro-esophageal reflux disease without esophagitis: Secondary | ICD-10-CM | POA: Diagnosis not present

## 2016-11-12 DIAGNOSIS — R5382 Chronic fatigue, unspecified: Secondary | ICD-10-CM | POA: Diagnosis not present

## 2016-11-12 DIAGNOSIS — Z792 Long term (current) use of antibiotics: Secondary | ICD-10-CM | POA: Diagnosis not present

## 2016-11-12 MED ORDER — AZACITIDINE CHEMO SQ INJECTION
130.0000 mg | Freq: Once | INTRAMUSCULAR | Status: AC
  Administered 2016-11-12: 130 mg via SUBCUTANEOUS
  Filled 2016-11-12: qty 5.2

## 2016-11-12 MED ORDER — ONDANSETRON HCL 4 MG PO TABS
8.0000 mg | ORAL_TABLET | Freq: Once | ORAL | Status: AC
  Administered 2016-11-12: 8 mg via ORAL
  Filled 2016-11-12: qty 2

## 2016-11-18 ENCOUNTER — Inpatient Hospital Stay: Payer: Medicare Other

## 2016-11-18 ENCOUNTER — Inpatient Hospital Stay (HOSPITAL_BASED_OUTPATIENT_CLINIC_OR_DEPARTMENT_OTHER): Payer: Medicare Other | Admitting: Internal Medicine

## 2016-11-18 VITALS — BP 145/79 | HR 75 | Temp 97.6°F | Wt 234.2 lb

## 2016-11-18 DIAGNOSIS — R231 Pallor: Secondary | ICD-10-CM | POA: Diagnosis not present

## 2016-11-18 DIAGNOSIS — E1122 Type 2 diabetes mellitus with diabetic chronic kidney disease: Secondary | ICD-10-CM

## 2016-11-18 DIAGNOSIS — Z87891 Personal history of nicotine dependence: Secondary | ICD-10-CM | POA: Diagnosis not present

## 2016-11-18 DIAGNOSIS — I129 Hypertensive chronic kidney disease with stage 1 through stage 4 chronic kidney disease, or unspecified chronic kidney disease: Secondary | ICD-10-CM

## 2016-11-18 DIAGNOSIS — Z79899 Other long term (current) drug therapy: Secondary | ICD-10-CM | POA: Diagnosis not present

## 2016-11-18 DIAGNOSIS — Z5111 Encounter for antineoplastic chemotherapy: Secondary | ICD-10-CM | POA: Diagnosis not present

## 2016-11-18 DIAGNOSIS — Z792 Long term (current) use of antibiotics: Secondary | ICD-10-CM | POA: Diagnosis not present

## 2016-11-18 DIAGNOSIS — Z7984 Long term (current) use of oral hypoglycemic drugs: Secondary | ICD-10-CM | POA: Diagnosis not present

## 2016-11-18 DIAGNOSIS — K219 Gastro-esophageal reflux disease without esophagitis: Secondary | ICD-10-CM | POA: Diagnosis not present

## 2016-11-18 DIAGNOSIS — D46Z Other myelodysplastic syndromes: Secondary | ICD-10-CM

## 2016-11-18 DIAGNOSIS — E785 Hyperlipidemia, unspecified: Secondary | ICD-10-CM

## 2016-11-18 DIAGNOSIS — R5382 Chronic fatigue, unspecified: Secondary | ICD-10-CM | POA: Diagnosis not present

## 2016-11-18 DIAGNOSIS — N183 Chronic kidney disease, stage 3 (moderate): Secondary | ICD-10-CM | POA: Diagnosis not present

## 2016-11-18 LAB — CBC WITH DIFFERENTIAL/PLATELET
BASOS ABS: 0 10*3/uL (ref 0–0.1)
Basophils Relative: 1 %
EOS PCT: 0 %
Eosinophils Absolute: 0 10*3/uL (ref 0–0.7)
HEMATOCRIT: 23.8 % — AB (ref 35.0–47.0)
HEMOGLOBIN: 8.1 g/dL — AB (ref 12.0–16.0)
LYMPHS PCT: 43 %
Lymphs Abs: 0.9 10*3/uL — ABNORMAL LOW (ref 1.0–3.6)
MCH: 33.3 pg (ref 26.0–34.0)
MCHC: 34 g/dL (ref 32.0–36.0)
MCV: 98.1 fL (ref 80.0–100.0)
Monocytes Absolute: 0.1 10*3/uL — ABNORMAL LOW (ref 0.2–0.9)
Monocytes Relative: 5 %
NEUTROS ABS: 1.1 10*3/uL — AB (ref 1.4–6.5)
Neutrophils Relative %: 51 %
Platelets: 53 10*3/uL — ABNORMAL LOW (ref 150–400)
RBC: 2.42 MIL/uL — AB (ref 3.80–5.20)
RDW: 17.9 % — ABNORMAL HIGH (ref 11.5–14.5)
WBC: 2.2 10*3/uL — AB (ref 3.6–11.0)

## 2016-11-18 LAB — BASIC METABOLIC PANEL
ANION GAP: 9 (ref 5–15)
BUN: 23 mg/dL — ABNORMAL HIGH (ref 6–20)
CHLORIDE: 106 mmol/L (ref 101–111)
CO2: 27 mmol/L (ref 22–32)
CREATININE: 0.9 mg/dL (ref 0.44–1.00)
Calcium: 9.2 mg/dL (ref 8.9–10.3)
GFR calc non Af Amer: >60 mL/min (ref >60)
Glucose, Bld: 162 mg/dL — ABNORMAL HIGH (ref 65–99)
POTASSIUM: 4.1 mmol/L (ref 3.5–5.1)
SODIUM: 142 mmol/L (ref 135–145)

## 2016-11-18 LAB — SAMPLE TO BLOOD BANK

## 2016-11-18 NOTE — Progress Notes (Signed)
Patient here today for follow up.  Patient states no new concerns today  

## 2016-11-18 NOTE — Progress Notes (Signed)
[name] NOTE  Patient Care Team: Roselee Nova, [name]  CHIEF COMPLAINTS/PURPOSE OF CONSULTATION:   Oncology History   #JUNE 2017- Severe neutropenia/ Anemia ~hb 9/platlets- 85-100 AUG 2017- REFRACTORY ANEMIA with EXCESS BLASTS [14% blasts- BMBx]; cytogenetics/FISH-N [SNP micorarray- not done]   # AUG 21st-  START AZA 75mg /m2 day- 1-7 q 28 days x4 cycles; DEC 6th- BMBx- <5% blasts; hypercellular with dysplasia.   # CKD stage III     MDS (myelodysplastic syndrome), high grade ([place])   04/23/2016 Initial Diagnosis    MDS (myelodysplastic syndrome), high grade (HCC)         HISTORY OF PRESENTING ILLNESS:  Teresa Carrillo 76 y.o.  female with above history of  MDS/high-grade currently started on Vidaza Status post cycle #7 approximately 2 weeks ago is here for follow-up and further treatment.  Cycle #7 was delayed by one week; for a platelet count of 11 [needing a transfusion]; however patient's blood counts improved over the next week. Cycle #7- was given at 20% dose reduction.    She seems to tolerate treatment well. She has not needed any blood transfusion recently.    Currently no bleeding noted. No fevers. No chills. Appetite is good. No weight loss.   Patient denies any nausea vomiting. Continues to chronic fatigue. Not any worse.  ROS: A c is done which is negative except mentioned above in history of present illness  MEDICAL HISTORY:  Past Medical History:  Diagnosis Date  . Abdominal wall mass   . Anginal pain ([place])   . Anxiety   . Arthritis   . Calculus of kidney   . Cystitis   . Depression   . Diabetes mellitus without complication (HCC)    elevated A1c  . Dyspnea on exertion   . Elevated serum creatinine   . Fibrocystic breast disease   . GERD (gastroesophageal reflux disease)   . Hearing loss   . Heart murmur   . HTN (hypertension)   . Hyperlipidemia   . MDS (myelodysplastic syndrome), high  grade ([place]) 04/23/2016  . Microscopic hematuria   . Mucositis due to chemotherapy   . Obesity   . Risk for falls   . Sleep apnea   . Thrombocytopenia ([place])   . Urinary frequency   . Urinary urgency     SURGICAL HISTORY: Past Surgical History:  Procedure Laterality Date  . ABDOMINAL HYSTERECTOMY    . APPENDECTOMY    . CARDIAC CATHETERIZATION     x2  . CHOLECYSTECTOMY    . COLONOSCOPY N/A 02/24/2015   Procedure: COLONOSCOPY;  Surgeon: Manya Silvas, MD;  Location: [facility] ENDOSCOPY;  Service: Endoscopy;  Laterality: N/A;  . DIAGNOSTIC LAPAROSCOPY     Removal of benign abdominal tumor  . ESOPHAGOGASTRODUODENOSCOPY N/A 02/24/2015   Procedure: ESOPHAGOGASTRODUODENOSCOPY (EGD);  Surgeon: Manya Silvas, MD;  Location: [facility] ENDOSCOPY;  Service: Endoscopy;  Laterality: N/A;  . 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 No smoking/ no alcohol.  Social History   Social History  . Marital status: Married    Spouse name: N/A  . Number of children: N/A  . Years of education: N/A   Occupational History  . Not on file.   Social History Main Topics  . Smoking status: Former Smoker    Quit date: 09/09/1988  . Smokeless tobacco: Never Used     Comment: quit 25  years ago  . Alcohol use No  . Drug use: No  . Sexual activity: Not on file   Other Topics Concern  . Not on file   Social History Narrative  . No narrative on file    FAMILY HISTORY: no cancers in family.  Family History  Problem Relation Age of Onset  . Congestive Heart Failure Mother   . Diabetes Mother   . Coronary artery disease Mother   . Stroke Mother   . Cirrhosis Father     ALLERGIES:  is allergic to macrobid [nitrofurantoin monohyd macro].  MEDICATIONS:  Current Outpatient Prescriptions  Medication Sig Dispense Refill  . acyclovir (ZOVIRAX) 400 MG tablet Take 1 tablet (400 mg total) by mouth 2 (two) times daily. 120 tablet 2  . buPROPion  (WELLBUTRIN XL) 150 MG 24 hr tablet Take 300 mg by mouth daily at 12 noon.     . clonazePAM (KLONOPIN) 0.5 MG tablet Take 0.5 mg by mouth 2 (two) times daily.     . hydrocortisone (ANUSOL-HC) 2.5 % rectal cream Place 1 application rectally 2 (two) times daily as needed for hemorrhoids or itching. 30 g 0  . LUMIGAN 0.01 % SOLN Place 1 drop into both eyes at bedtime.     . magic mouthwash w/lidocaine SOLN Take 5 mLs by mouth 4 (four) times daily. 80 ml viscous lidocaine 2%, 80 ml Mylanta, 80 ml Diphenhydramine 12.5 mg/5 ml Elixir, 80 ml Nystatin 100,000 Unit suspension, 80 ml Prednisolone 15 mg/65ml, 80 ml Distilled Water.  Sig: Swish/Swallow 5-10 ml four times a day as needed. Dispense 480 ml. 3RFs 480 mL 3  . metFORMIN (GLUCOPHAGE) 500 MG tablet TAKE 1 TABLET BY MOUTH DAILY 90 tablet 0  . Multiple Vitamin (MULTIVITAMIN WITH MINERALS) TABS tablet Take 1 tablet by mouth daily.    . Omega-3 Fatty Acids (FISH OIL) 1000 MG CAPS Take 1 capsule by mouth daily.    . ondansetron (ZOFRAN-ODT) 4 MG disintegrating tablet Take 4 mg by mouth every 8 (eight) hours as needed for nausea or vomiting.    . polyethylene glycol (MIRALAX / GLYCOLAX) packet Take 17 g by mouth daily as needed for mild constipation. 14 each 1  . prochlorperazine (COMPAZINE) 10 MG tablet Take 1 tablet (10 mg total) by mouth every 6 (six) hours as needed (Nausea or vomiting). 30 tablet 1  . rosuvastatin (CRESTOR) 20 MG tablet TAKE 1 TABLET(20 MG) BY MOUTH DAILY 90 tablet 0  . sertraline (ZOLOFT) 100 MG tablet Take 100 mg by mouth daily at 12 noon.     Marland Kitchen Umeclidinium-Vilanterol (ANORO ELLIPTA) 62.5-25 MCG/INH AEPB Inhale 1 puff into the lungs daily as needed (shortness of breath).      No current facility-administered medications for this visit.       Marland Kitchen  PHYSICAL EXAMINATION: ECOG PERFORMANCE STATUS: 1 - Symptomatic but completely ambulatory  Vitals:   11/18/16 1128  BP: (!) 145/79  Pulse: 75  Temp: 97.6 F (36.4 C)   Filed  Weights   11/18/16 1128  Weight: 234 lb 4 oz (106.3 kg)    GENERAL: Well-nourished well-developed; Alert, no distress and comfortable.   Obese. Accompanied by her /husband. She is in a wheel chair.  EYES: Positive for pallor. OROPHARYNX: no thrush or ulceration;  NECK: supple, no masses felt LYMPH:  no palpable lymphadenopathy in the cervical, axillary or inguinal regions LUNGS: clear to auscultation and  No wheeze or crackles HEART/CVS: regular rate & rhythm and no murmurs; No lower extremity  edema ABDOMEN: abdomen soft, non-tender and normal bowel sounds Musculoskeletal:no cyanosis of digits and no clubbing  PSYCH: alert & oriented x 3 with fluent speech NEURO: no focal motor/sensory deficits SKIN:  ablation noted on the right leg /improving no signs of infection.   LABORATORY DATA:  I have reviewed the data as listed Lab Results  Component Value Date   WBC 2.2 (L) 11/18/2016   HGB 8.1 (L) 11/18/2016   HCT 23.8 (L) 11/18/2016   MCV 98.1 11/18/2016   PLT 53 (L) 11/18/2016    Recent Labs  05/07/16 1652  09/10/16 1110 09/30/16 0942 10/28/16 0936 11/11/16 0946 11/18/16 1100  NA  --   < > 138 139 141 141 142  K  --   < > 4.1 4.9 4.4 4.4 4.1  CL  --   < > 104 105 111 105 106  CO2  --   < > 26 28 24 30 27   GLUCOSE  --   < > 167* 125* 135* 139* 162*  BUN  --   < > 22* 30* 24* 17 23*  CREATININE  --   < > 0.99 1.21* 1.00 0.81 0.90  CALCIUM  --   < > 9.0 9.5 9.1 9.0 9.2  GFRNONAA  --   < > 54* 43* 54* >60 >60  GFRAA  --   < > >60 49* >60 >60 >60  PROT 8.3*  < > 7.4 7.7 7.2  --   --   ALBUMIN 3.8  < > 3.9 4.1 3.7  --   --   AST 26  < > 24 24 17   --   --   ALT 25  < > 23 27 19   --   --   ALKPHOS 65  < > 77 77 77  --   --   BILITOT 0.6  < > 0.4 0.3 0.4  --   --   BILIDIR 0.1  --   --   --   --   --   --   IBILI 0.5  --   --   --   --   --   --   < > = values in this interval not displayed.  RADIOGRAPHIC STUDIES: I have personally reviewed the radiological images as  listed and agreed with the findings in the report. No results found.  ASSESSMENT & PLAN:   MDS (myelodysplastic syndrome), high grade (HCC) Refractory anemia- with excess blasts- II [blasts percentage 14%] FISH- normal karyotype. On vidaza; s/p  cycle # 5 BMX- Partial response noted < 5% blasts noted; continued hyper-cellular bone marrow; dysplastic changes.  # Patient is currently status post 7 cycles. Pt tolerating treatment well except for neutrpenia/ low platelets-; cut down dose by 20% with cycle #7. Will plan repeating BMBx after cycle #8.   #CKD stage III-stable.  # Prophylactic antibiotics- acyclovir/ diflucan.     # weekly cbc/hold tube/ follow up with me on April 2nd for chemo/labs; Appt at [facility] on march 26th.      Cammie Sickle, MD 11/18/2016 1:06 PM

## 2016-11-18 NOTE — Assessment & Plan Note (Addendum)
Refractory anemia- with excess blasts- II [blasts percentage 14%] FISH- normal karyotype. On vidaza; s/p  cycle # 5 BMX- Partial response noted < 5% blasts noted; continued hyper-cellular bone marrow; dysplastic changes.  # Patient is currently status post 7 cycles. Pt tolerating treatment well except for neutrpenia/ low platelets-; cut down dose by 20% with cycle #7. Will plan repeating BMBx after cycle #8.   #CKD stage III-stable.  # Prophylactic antibiotics- acyclovir/ diflucan.     # weekly cbc/hold tube/ follow up with me on April 2nd for chemo/labs; Appt at [facility] on march 26th.

## 2016-11-19 ENCOUNTER — Other Ambulatory Visit: Payer: Self-pay | Admitting: Internal Medicine

## 2016-11-19 DIAGNOSIS — D46Z Other myelodysplastic syndromes: Secondary | ICD-10-CM

## 2016-11-22 ENCOUNTER — Telehealth: Payer: Self-pay | Admitting: Family Medicine

## 2016-11-22 DIAGNOSIS — E782 Mixed hyperlipidemia: Secondary | ICD-10-CM

## 2016-11-22 MED ORDER — ROSUVASTATIN CALCIUM 20 MG PO TABS: 20.0000 mg | ORAL_TABLET | Freq: Every day | ORAL | 0 refills | Status: DC

## 2016-11-22 NOTE — Telephone Encounter (Signed)
Pt has scheduled appointment for this month 12-03-16 but will be out of crestor (generic) before her appt. Asking that you please send a refill to [place]

## 2016-11-22 NOTE — Telephone Encounter (Signed)
Rx sent; last SGPT reviewed I sent first Rx to local [facility]; called and canceled Rx to [facility]

## 2016-11-22 NOTE — Telephone Encounter (Signed)
Pt called back and refused her annual wellness. States that she currently have cancer and just would like to see Dr Manuella Ghazi for her medications.

## 2016-11-25 ENCOUNTER — Inpatient Hospital Stay: Payer: Medicare Other

## 2016-11-27 ENCOUNTER — Inpatient Hospital Stay: Payer: Medicare Other

## 2016-11-27 DIAGNOSIS — D46Z Other myelodysplastic syndromes: Secondary | ICD-10-CM

## 2016-11-27 DIAGNOSIS — K219 Gastro-esophageal reflux disease without esophagitis: Secondary | ICD-10-CM | POA: Diagnosis not present

## 2016-11-27 DIAGNOSIS — Z5111 Encounter for antineoplastic chemotherapy: Secondary | ICD-10-CM | POA: Diagnosis not present

## 2016-11-27 DIAGNOSIS — R231 Pallor: Secondary | ICD-10-CM | POA: Diagnosis not present

## 2016-11-27 DIAGNOSIS — N183 Chronic kidney disease, stage 3 (moderate): Secondary | ICD-10-CM | POA: Diagnosis not present

## 2016-11-27 DIAGNOSIS — I129 Hypertensive chronic kidney disease with stage 1 through stage 4 chronic kidney disease, or unspecified chronic kidney disease: Secondary | ICD-10-CM | POA: Diagnosis not present

## 2016-11-27 DIAGNOSIS — Z792 Long term (current) use of antibiotics: Secondary | ICD-10-CM | POA: Diagnosis not present

## 2016-11-27 DIAGNOSIS — Z79899 Other long term (current) drug therapy: Secondary | ICD-10-CM | POA: Diagnosis not present

## 2016-11-27 DIAGNOSIS — Z87891 Personal history of nicotine dependence: Secondary | ICD-10-CM | POA: Diagnosis not present

## 2016-11-27 DIAGNOSIS — R5382 Chronic fatigue, unspecified: Secondary | ICD-10-CM | POA: Diagnosis not present

## 2016-11-27 DIAGNOSIS — E1122 Type 2 diabetes mellitus with diabetic chronic kidney disease: Secondary | ICD-10-CM | POA: Diagnosis not present

## 2016-11-27 DIAGNOSIS — E785 Hyperlipidemia, unspecified: Secondary | ICD-10-CM | POA: Diagnosis not present

## 2016-11-27 DIAGNOSIS — Z7984 Long term (current) use of oral hypoglycemic drugs: Secondary | ICD-10-CM | POA: Diagnosis not present

## 2016-11-27 LAB — CBC WITH DIFFERENTIAL/PLATELET
BASOS ABS: 0 10*3/uL (ref 0–0.1)
BASOS PCT: 1 %
Eosinophils Absolute: 0 10*3/uL (ref 0–0.7)
Eosinophils Relative: 0 %
HEMATOCRIT: 24.9 % — AB (ref 35.0–47.0)
HEMOGLOBIN: 8.2 g/dL — AB (ref 12.0–16.0)
Lymphocytes Relative: 34 %
Lymphs Abs: 1 10*3/uL (ref 1.0–3.6)
MCH: 33 pg (ref 26.0–34.0)
MCHC: 33.1 g/dL (ref 32.0–36.0)
MCV: 99.7 fL (ref 80.0–100.0)
MONOS PCT: 3 %
Monocytes Absolute: 0.1 10*3/uL — ABNORMAL LOW (ref 0.2–0.9)
NEUTROS PCT: 62 %
Neutro Abs: 1.9 10*3/uL (ref 1.4–6.5)
Platelets: 30 10*3/uL — ABNORMAL LOW (ref 150–440)
RBC: 2.5 MIL/uL — ABNORMAL LOW (ref 3.80–5.20)
RDW: 19.1 % — ABNORMAL HIGH (ref 11.5–14.5)
WBC: 3.1 10*3/uL — ABNORMAL LOW (ref 3.6–11.0)

## 2016-11-27 LAB — SAMPLE TO BLOOD BANK

## 2016-11-28 DIAGNOSIS — G4733 Obstructive sleep apnea (adult) (pediatric): Secondary | ICD-10-CM | POA: Diagnosis not present

## 2016-11-28 DIAGNOSIS — J449 Chronic obstructive pulmonary disease, unspecified: Secondary | ICD-10-CM | POA: Diagnosis not present

## 2016-11-28 DIAGNOSIS — J439 Emphysema, unspecified: Secondary | ICD-10-CM | POA: Diagnosis not present

## 2016-11-28 DIAGNOSIS — R0602 Shortness of breath: Secondary | ICD-10-CM | POA: Diagnosis not present

## 2016-12-02 ENCOUNTER — Inpatient Hospital Stay: Payer: Medicare Other

## 2016-12-02 DIAGNOSIS — Z7984 Long term (current) use of oral hypoglycemic drugs: Secondary | ICD-10-CM | POA: Diagnosis not present

## 2016-12-02 DIAGNOSIS — D46Z Other myelodysplastic syndromes: Secondary | ICD-10-CM

## 2016-12-02 DIAGNOSIS — Z792 Long term (current) use of antibiotics: Secondary | ICD-10-CM | POA: Diagnosis not present

## 2016-12-02 DIAGNOSIS — E1122 Type 2 diabetes mellitus with diabetic chronic kidney disease: Secondary | ICD-10-CM | POA: Diagnosis not present

## 2016-12-02 DIAGNOSIS — R231 Pallor: Secondary | ICD-10-CM | POA: Diagnosis not present

## 2016-12-02 DIAGNOSIS — E785 Hyperlipidemia, unspecified: Secondary | ICD-10-CM | POA: Diagnosis not present

## 2016-12-02 DIAGNOSIS — N183 Chronic kidney disease, stage 3 (moderate): Secondary | ICD-10-CM | POA: Diagnosis not present

## 2016-12-02 DIAGNOSIS — I129 Hypertensive chronic kidney disease with stage 1 through stage 4 chronic kidney disease, or unspecified chronic kidney disease: Secondary | ICD-10-CM | POA: Diagnosis not present

## 2016-12-02 DIAGNOSIS — R5382 Chronic fatigue, unspecified: Secondary | ICD-10-CM | POA: Diagnosis not present

## 2016-12-02 DIAGNOSIS — Z5111 Encounter for antineoplastic chemotherapy: Secondary | ICD-10-CM | POA: Diagnosis not present

## 2016-12-02 DIAGNOSIS — Z87891 Personal history of nicotine dependence: Secondary | ICD-10-CM | POA: Diagnosis not present

## 2016-12-02 DIAGNOSIS — K219 Gastro-esophageal reflux disease without esophagitis: Secondary | ICD-10-CM | POA: Diagnosis not present

## 2016-12-02 DIAGNOSIS — Z79899 Other long term (current) drug therapy: Secondary | ICD-10-CM | POA: Diagnosis not present

## 2016-12-02 LAB — CBC WITH DIFFERENTIAL/PLATELET
Basophils Absolute: 0 10*3/uL (ref 0–0.1)
Basophils Relative: 1 %
EOS ABS: 0 10*3/uL (ref 0–0.7)
Eosinophils Relative: 0 %
HEMATOCRIT: 24.2 % — AB (ref 35.0–47.0)
Hemoglobin: 8 g/dL — ABNORMAL LOW (ref 12.0–16.0)
LYMPHS ABS: 0.9 10*3/uL — AB (ref 1.0–3.6)
Lymphocytes Relative: 34 %
MCH: 32.8 pg (ref 26.0–34.0)
MCHC: 33 g/dL (ref 32.0–36.0)
MCV: 99.4 fL (ref 80.0–100.0)
Monocytes Absolute: 0.1 10*3/uL — ABNORMAL LOW (ref 0.2–0.9)
Monocytes Relative: 4 %
NEUTROS ABS: 1.6 10*3/uL (ref 1.4–6.5)
NEUTROS PCT: 61 %
Platelets: 51 10*3/uL — ABNORMAL LOW (ref 150–440)
RBC: 2.44 MIL/uL — ABNORMAL LOW (ref 3.80–5.20)
RDW: 19.2 % — ABNORMAL HIGH (ref 11.5–14.5)
WBC: 2.6 10*3/uL — AB (ref 3.6–11.0)

## 2016-12-02 LAB — SAMPLE TO BLOOD BANK

## 2016-12-03 ENCOUNTER — Ambulatory Visit: Payer: Medicare Other | Admitting: Family Medicine

## 2016-12-09 ENCOUNTER — Inpatient Hospital Stay: Payer: Medicare Other | Attending: Internal Medicine

## 2016-12-09 ENCOUNTER — Inpatient Hospital Stay (HOSPITAL_BASED_OUTPATIENT_CLINIC_OR_DEPARTMENT_OTHER): Payer: Medicare Other | Admitting: Internal Medicine

## 2016-12-09 ENCOUNTER — Other Ambulatory Visit: Payer: Self-pay | Admitting: *Deleted

## 2016-12-09 ENCOUNTER — Inpatient Hospital Stay: Payer: Medicare Other

## 2016-12-09 VITALS — BP 130/60 | HR 66 | Temp 97.0°F | Wt 239.2 lb

## 2016-12-09 DIAGNOSIS — Z5111 Encounter for antineoplastic chemotherapy: Secondary | ICD-10-CM | POA: Diagnosis not present

## 2016-12-09 DIAGNOSIS — G4730 Sleep apnea, unspecified: Secondary | ICD-10-CM | POA: Insufficient documentation

## 2016-12-09 DIAGNOSIS — R5382 Chronic fatigue, unspecified: Secondary | ICD-10-CM

## 2016-12-09 DIAGNOSIS — Z87891 Personal history of nicotine dependence: Secondary | ICD-10-CM

## 2016-12-09 DIAGNOSIS — Z87442 Personal history of urinary calculi: Secondary | ICD-10-CM | POA: Diagnosis not present

## 2016-12-09 DIAGNOSIS — E669 Obesity, unspecified: Secondary | ICD-10-CM | POA: Insufficient documentation

## 2016-12-09 DIAGNOSIS — R229 Localized swelling, mass and lump, unspecified: Secondary | ICD-10-CM

## 2016-12-09 DIAGNOSIS — R2241 Localized swelling, mass and lump, right lower limb: Secondary | ICD-10-CM

## 2016-12-09 DIAGNOSIS — F418 Other specified anxiety disorders: Secondary | ICD-10-CM

## 2016-12-09 DIAGNOSIS — D46Z Other myelodysplastic syndromes: Secondary | ICD-10-CM

## 2016-12-09 DIAGNOSIS — K219 Gastro-esophageal reflux disease without esophagitis: Secondary | ICD-10-CM | POA: Diagnosis not present

## 2016-12-09 DIAGNOSIS — N183 Chronic kidney disease, stage 3 (moderate): Secondary | ICD-10-CM | POA: Diagnosis not present

## 2016-12-09 DIAGNOSIS — Z7984 Long term (current) use of oral hypoglycemic drugs: Secondary | ICD-10-CM

## 2016-12-09 DIAGNOSIS — Z792 Long term (current) use of antibiotics: Secondary | ICD-10-CM | POA: Diagnosis not present

## 2016-12-09 DIAGNOSIS — R0609 Other forms of dyspnea: Secondary | ICD-10-CM | POA: Diagnosis not present

## 2016-12-09 DIAGNOSIS — D4622 Refractory anemia with excess of blasts 2: Secondary | ICD-10-CM

## 2016-12-09 DIAGNOSIS — R2231 Localized swelling, mass and lump, right upper limb: Secondary | ICD-10-CM

## 2016-12-09 DIAGNOSIS — E1122 Type 2 diabetes mellitus with diabetic chronic kidney disease: Secondary | ICD-10-CM | POA: Insufficient documentation

## 2016-12-09 DIAGNOSIS — E785 Hyperlipidemia, unspecified: Secondary | ICD-10-CM | POA: Insufficient documentation

## 2016-12-09 DIAGNOSIS — I129 Hypertensive chronic kidney disease with stage 1 through stage 4 chronic kidney disease, or unspecified chronic kidney disease: Secondary | ICD-10-CM

## 2016-12-09 DIAGNOSIS — D649 Anemia, unspecified: Secondary | ICD-10-CM

## 2016-12-09 DIAGNOSIS — Z79899 Other long term (current) drug therapy: Secondary | ICD-10-CM | POA: Diagnosis not present

## 2016-12-09 DIAGNOSIS — R231 Pallor: Secondary | ICD-10-CM | POA: Diagnosis not present

## 2016-12-09 DIAGNOSIS — D469 Myelodysplastic syndrome, unspecified: Secondary | ICD-10-CM

## 2016-12-09 LAB — COMPREHENSIVE METABOLIC PANEL
ALBUMIN: 3.7 g/dL (ref 3.5–5.0)
ALK PHOS: 73 U/L (ref 38–126)
ALT: 17 U/L (ref 14–54)
ANION GAP: 8 (ref 5–15)
AST: 19 U/L (ref 15–41)
BILIRUBIN TOTAL: 0.5 mg/dL (ref 0.3–1.2)
BUN: 18 mg/dL (ref 6–20)
CALCIUM: 9 mg/dL (ref 8.9–10.3)
CO2: 26 mmol/L (ref 22–32)
Chloride: 109 mmol/L (ref 101–111)
Creatinine, Ser: 1 mg/dL (ref 0.44–1.00)
GFR calc non Af Amer: 54 mL/min — ABNORMAL LOW (ref >60)
GLUCOSE: 138 mg/dL — AB (ref 65–99)
Potassium: 4.2 mmol/L (ref 3.5–5.1)
Sodium: 143 mmol/L (ref 135–145)
TOTAL PROTEIN: 6.9 g/dL (ref 6.5–8.1)

## 2016-12-09 LAB — CBC WITH DIFFERENTIAL/PLATELET
Basophils Absolute: 0 10*3/uL (ref 0–0.1)
Basophils Relative: 1 %
Eosinophils Absolute: 0 10*3/uL (ref 0–0.7)
Eosinophils Relative: 0 %
HEMATOCRIT: 23.2 % — AB (ref 35.0–47.0)
HEMOGLOBIN: 7.7 g/dL — AB (ref 12.0–16.0)
LYMPHS ABS: 1 10*3/uL (ref 1.0–3.6)
Lymphocytes Relative: 43 %
MCH: 33 pg (ref 26.0–34.0)
MCHC: 33.1 g/dL (ref 32.0–36.0)
MCV: 99.7 fL (ref 80.0–100.0)
MONOS PCT: 4 %
Monocytes Absolute: 0.1 10*3/uL — ABNORMAL LOW (ref 0.2–0.9)
NEUTROS ABS: 1.2 10*3/uL — AB (ref 1.4–6.5)
NEUTROS PCT: 52 %
Platelets: 105 10*3/uL — ABNORMAL LOW (ref 150–440)
RBC: 2.32 MIL/uL — ABNORMAL LOW (ref 3.80–5.20)
RDW: 19.9 % — ABNORMAL HIGH (ref 11.5–14.5)
WBC: 2.3 10*3/uL — ABNORMAL LOW (ref 3.6–11.0)

## 2016-12-09 LAB — SAMPLE TO BLOOD BANK

## 2016-12-09 LAB — PREPARE RBC (CROSSMATCH)

## 2016-12-09 MED ORDER — AZACITIDINE CHEMO SQ INJECTION
130.0000 mg | Freq: Once | INTRAMUSCULAR | Status: AC
  Administered 2016-12-09: 130 mg via SUBCUTANEOUS
  Filled 2016-12-09: qty 5.2

## 2016-12-09 MED ORDER — ONDANSETRON HCL 4 MG PO TABS
8.0000 mg | ORAL_TABLET | Freq: Once | ORAL | Status: AC
  Administered 2016-12-09: 8 mg via ORAL
  Filled 2016-12-09: qty 2

## 2016-12-09 NOTE — Progress Notes (Signed)
[place] NOTE  Patient Care Team: Roselee Nova, [name]  CHIEF COMPLAINTS/PURPOSE OF CONSULTATION:   Oncology History   #JUNE 2017- Severe neutropenia/ Anemia ~hb 9/platlets- 85-100 AUG 2017- REFRACTORY ANEMIA with EXCESS BLASTS [14% blasts- BMBx]; cytogenetics/FISH-N [SNP micorarray- not done]   # AUG 21st-  START AZA 75mg /m2 day- 1-7 q 28 days x4 cycles; DEC 6th- BMBx- <5% blasts; hypercellular with dysplasia.   # CKD stage III     MDS (myelodysplastic syndrome), high grade ([place])   04/23/2016 Initial Diagnosis    MDS (myelodysplastic syndrome), high grade (HCC)         HISTORY OF PRESENTING ILLNESS:  Teresa Carrillo 76 y.o.  female with above history of  MDS/high-grade currently started on Vidaza Status post cycle #7 [reduced by 20% sec to cytopenias] approximately 4 weeks ago is here for follow-up and further treatment.  She seems to tolerate treatment well. She has not needed any blood transfusion recently. Currently no bleeding noted. No fevers. No chills. Appetite is good. No weight loss. Patient denies any nausea vomiting. Continues to chronic fatigue. Not any worse.  Interestingly patient noted to have 2 small "knots"- 1 on her right arm and 1 on the leg. She noted approximately 2-3 weeks ago. They've been tender. No new knots. No infections.  ROS: A complete 10 point review of system is done which is negative except mentioned above in history of present illness.  MEDICAL HISTORY:  Past Medical History:  Diagnosis Date  . Abdominal wall mass   . Anginal pain ([place])   . Anxiety   . Arthritis   . Calculus of kidney   . Cystitis   . Depression   . Diabetes mellitus without complication (HCC)    elevated A1c  . Dyspnea on exertion   . Elevated serum creatinine   . Fibrocystic breast disease   . GERD (gastroesophageal reflux disease)   . Hearing loss   . Heart murmur   . HTN (hypertension)   .  Hyperlipidemia   . MDS (myelodysplastic syndrome), high grade ([place]) 04/23/2016  . Microscopic hematuria   . Mucositis due to chemotherapy   . Obesity   . Risk for falls   . Sleep apnea   . Thrombocytopenia ([place])   . Urinary frequency   . Urinary urgency     SURGICAL HISTORY: Past Surgical History:  Procedure Laterality Date  . ABDOMINAL HYSTERECTOMY    . APPENDECTOMY    . CARDIAC CATHETERIZATION     x2  . CHOLECYSTECTOMY    . COLONOSCOPY N/A 02/24/2015   Procedure: COLONOSCOPY;  Surgeon: Manya Silvas, MD;  Location: [facility] ENDOSCOPY;  Service: Endoscopy;  Laterality: N/A;  . DIAGNOSTIC LAPAROSCOPY     Removal of benign abdominal tumor  . ESOPHAGOGASTRODUODENOSCOPY N/A 02/24/2015   Procedure: ESOPHAGOGASTRODUODENOSCOPY (EGD);  Surgeon: Manya Silvas, MD;  Location: [facility] ENDOSCOPY;  Service: Endoscopy;  Laterality: N/A;  . 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 No smoking/ no alcohol.  Social History   Social History  . Marital status: Married    Spouse name: N/A  . Number of children: N/A  . Years of education: N/A   Occupational History  . Not on file.   Social History Main Topics  . Smoking status: Former Smoker    Quit date: 09/09/1988  . Smokeless tobacco: Never Used  Comment: quit 25 years ago  . Alcohol use No  . Drug use: No  . Sexual activity: Not on file   Other Topics Concern  . Not on file   Social History Narrative  . No narrative on file    FAMILY HISTORY: no cancers in family.  Family History  Problem Relation Age of Onset  . Congestive Heart Failure Mother   . Diabetes Mother   . Coronary artery disease Mother   . Stroke Mother   . Cirrhosis Father     ALLERGIES:  is allergic to macrobid [nitrofurantoin monohyd macro].  MEDICATIONS:  Current Outpatient Prescriptions  Medication Sig Dispense Refill  . acyclovir (ZOVIRAX) 400 MG tablet TAKE 1 TABLET(400 MG) BY MOUTH TWICE  DAILY 120 tablet 0  . buPROPion (WELLBUTRIN XL) 150 MG 24 hr tablet Take 300 mg by mouth daily at 12 noon.     . clonazePAM (KLONOPIN) 0.5 MG tablet Take 0.5 mg by mouth 2 (two) times daily.     . hydrocortisone (ANUSOL-HC) 2.5 % rectal cream Place 1 application rectally 2 (two) times daily as needed for hemorrhoids or itching. 30 g 0  . LUMIGAN 0.01 % SOLN Place 1 drop into both eyes at bedtime.     . magic mouthwash w/lidocaine SOLN Take 5 mLs by mouth 4 (four) times daily. 80 ml viscous lidocaine 2%, 80 ml Mylanta, 80 ml Diphenhydramine 12.5 mg/5 ml Elixir, 80 ml Nystatin 100,000 Unit suspension, 80 ml Prednisolone 15 mg/71ml, 80 ml Distilled Water.  Sig: Swish/Swallow 5-10 ml four times a day as needed. Dispense 480 ml. 3RFs 480 mL 3  . metFORMIN (GLUCOPHAGE) 500 MG tablet TAKE 1 TABLET BY MOUTH DAILY 90 tablet 0  . Multiple Vitamin (MULTIVITAMIN WITH MINERALS) TABS tablet Take 1 tablet by mouth daily.    . Omega-3 Fatty Acids (FISH OIL) 1000 MG CAPS Take 1 capsule by mouth daily.    . ondansetron (ZOFRAN-ODT) 4 MG disintegrating tablet Take 4 mg by mouth every 8 (eight) hours as needed for nausea or vomiting.    . polyethylene glycol (MIRALAX / GLYCOLAX) packet Take 17 g by mouth daily as needed for mild constipation. 14 each 1  . prochlorperazine (COMPAZINE) 10 MG tablet Take 1 tablet (10 mg total) by mouth every 6 (six) hours as needed (Nausea or vomiting). 30 tablet 1  . rosuvastatin (CRESTOR) 20 MG tablet Take 1 tablet (20 mg total) by mouth at bedtime. 90 tablet 0  . sertraline (ZOLOFT) 100 MG tablet Take 100 mg by mouth daily at 12 noon.     Marland Kitchen Umeclidinium-Vilanterol (ANORO ELLIPTA) 62.5-25 MCG/INH AEPB Inhale 1 puff into the lungs daily as needed (shortness of breath).      No current facility-administered medications for this visit.       Marland Kitchen  PHYSICAL EXAMINATION: ECOG PERFORMANCE STATUS: 1 - Symptomatic but completely ambulatory  Vitals:   12/09/16 1001  BP: 130/60  Pulse: 66   Temp: 97 F (36.1 C)   Filed Weights   12/09/16 1001  Weight: 239 lb 3.2 oz (108.5 kg)    GENERAL: Well-nourished well-developed; Alert, no distress and comfortable.   Obese. Accompanied by her /husband. She is in a wheel chair.  EYES: Positive for pallor. OROPHARYNX: no thrush or ulceration;  NECK: supple, no masses felt LYMPH:  no palpable lymphadenopathy in the cervical, axillary or inguinal regions LUNGS: clear to auscultation and  No wheeze or crackles HEART/CVS: regular rate & rhythm and no murmurs; No lower  extremity edema ABDOMEN: abdomen soft, non-tender and normal bowel sounds Musculoskeletal:no cyanosis of digits and no clubbing  PSYCH: alert & oriented x 3 with fluent speech NEURO: no focal motor/sensory deficits SKIN:  ~1.5cm subcutaneous nodules noted right arm and right leg. Mildly tender.   LABORATORY DATA:  I have reviewed the data as listed Lab Results  Component Value Date   WBC 2.3 (L) 12/09/2016   HGB 7.7 (L) 12/09/2016   HCT 23.2 (L) 12/09/2016   MCV 99.7 12/09/2016   PLT 105 (L) 12/09/2016    Recent Labs  05/07/16 1652  09/30/16 0942 10/28/16 0936 11/11/16 0946 11/18/16 1100 12/09/16 0929  NA  --   < > 139 141 141 142 143  K  --   < > 4.9 4.4 4.4 4.1 4.2  CL  --   < > 105 111 105 106 109  CO2  --   < > 28 24 30 27 26   GLUCOSE  --   < > 125* 135* 139* 162* 138*  BUN  --   < > 30* 24* 17 23* 18  CREATININE  --   < > 1.21* 1.00 0.81 0.90 1.00  CALCIUM  --   < > 9.5 9.1 9.0 9.2 9.0  GFRNONAA  --   < > 43* 54* >60 >60 54*  GFRAA  --   < > 49* >60 >60 >60 >60  PROT 8.3*  < > 7.7 7.2  --   --  6.9  ALBUMIN 3.8  < > 4.1 3.7  --   --  3.7  AST 26  < > 24 17  --   --  19  ALT 25  < > 27 19  --   --  17  ALKPHOS 65  < > 77 77  --   --  73  BILITOT 0.6  < > 0.3 0.4  --   --  0.5  BILIDIR 0.1  --   --   --   --   --   --   IBILI 0.5  --   --   --   --   --   --   < > = values in this interval not displayed.  RADIOGRAPHIC STUDIES: I have  personally reviewed the radiological images as listed and agreed with the findings in the report. No results found.  ASSESSMENT & PLAN:   MDS (myelodysplastic syndrome), high grade (HCC) Refractory anemia- with excess blasts- II [blasts percentage 14%] FISH- normal karyotype. On vidaza; s/p  cycle # 5 BMX- Partial response noted < 5% blasts noted; continued hyper-cellular bone marrow; dysplastic changes. Patient is currently status post 7 cycles [ cut down dose by 20% with cycle #7.]  # Proceed with cycle #8 today. Pt tolerating treatment well except for hemoglobin 7.7/ neutrpenia/ low platelets [see below]; Will plan repeating BMBx after cycle #8.   # Anemia/symptomatic- Hb 7.7; proceed with 2 units this week.   # Subcutaneous nodules ~1.5 cm x2 [right lat elbow; Right LE medial aspect]; ? Leukemic cutis versus others. recommend US guided Bx asap. Discussed with Dr.Yamagata.   #CKD stage III-stable.  # Prophylactic antibiotics- acyclovir/ diflucan.     # weekly cbc/hold tube/ follow up with me in appx 2 weeks;  Appt at [facility] on May 14th. Plan BMBx in week of 23rd.      Cammie Sickle, MD 12/10/2016 1:40 PM

## 2016-12-09 NOTE — Progress Notes (Signed)
Hgb: 7.7. MD, Dr. Rogue Bussing, notified and already aware. Per MD order: proceed with scheduled treatment today. Patient is coming for pRBCs transfusion at a later date this week. Treatment plan date changed to today's date, 12/09/16, per MD order.

## 2016-12-09 NOTE — Progress Notes (Signed)
Patient here for pre treatment check. No changes since last appointment. 

## 2016-12-09 NOTE — Assessment & Plan Note (Addendum)
Refractory anemia- with excess blasts- II [blasts percentage 14%] FISH- normal karyotype. On vidaza; s/p  cycle # 5 BMX- Partial response noted < 5% blasts noted; continued hyper-cellular bone marrow; dysplastic changes. Patient is currently status post 7 cycles [ cut down dose by 20% with cycle #7.]  # Proceed with cycle #8 today. Pt tolerating treatment well except for hemoglobin 7.7/ neutrpenia/ low platelets [see below]; Will plan repeating BMBx after cycle #8.   # Anemia/symptomatic- Hb 7.7; proceed with 2 units this week.   # Subcutaneous nodules ~1.5 cm x2 [right lat elbow; Right LE medial aspect]; ? Leukemic cutis versus others. recommend US guided Bx asap. Discussed with Dr.Yamagata.   #CKD stage III-stable.  # Prophylactic antibiotics- acyclovir/ diflucan.     # weekly cbc/hold tube/ follow up with me in appx 2 weeks;  Appt at [facility] on May 14th. Plan BMBx in week of 23rd.

## 2016-12-09 NOTE — Progress Notes (Signed)
Pt to receive 2 units of blood tomorrow. V/o from Dr. Rogue Bussing. Blood bank orders entered

## 2016-12-10 ENCOUNTER — Inpatient Hospital Stay: Payer: Medicare Other

## 2016-12-10 VITALS — BP 128/72 | HR 80 | Temp 97.4°F | Resp 20

## 2016-12-10 DIAGNOSIS — Z79899 Other long term (current) drug therapy: Secondary | ICD-10-CM | POA: Diagnosis not present

## 2016-12-10 DIAGNOSIS — K219 Gastro-esophageal reflux disease without esophagitis: Secondary | ICD-10-CM | POA: Diagnosis not present

## 2016-12-10 DIAGNOSIS — E1122 Type 2 diabetes mellitus with diabetic chronic kidney disease: Secondary | ICD-10-CM | POA: Diagnosis not present

## 2016-12-10 DIAGNOSIS — R2231 Localized swelling, mass and lump, right upper limb: Secondary | ICD-10-CM | POA: Diagnosis not present

## 2016-12-10 DIAGNOSIS — Z5111 Encounter for antineoplastic chemotherapy: Secondary | ICD-10-CM | POA: Diagnosis not present

## 2016-12-10 DIAGNOSIS — R2241 Localized swelling, mass and lump, right lower limb: Secondary | ICD-10-CM | POA: Diagnosis not present

## 2016-12-10 DIAGNOSIS — R0609 Other forms of dyspnea: Secondary | ICD-10-CM | POA: Diagnosis not present

## 2016-12-10 DIAGNOSIS — N183 Chronic kidney disease, stage 3 (moderate): Secondary | ICD-10-CM | POA: Diagnosis not present

## 2016-12-10 DIAGNOSIS — Z792 Long term (current) use of antibiotics: Secondary | ICD-10-CM | POA: Diagnosis not present

## 2016-12-10 DIAGNOSIS — R231 Pallor: Secondary | ICD-10-CM | POA: Diagnosis not present

## 2016-12-10 DIAGNOSIS — D4622 Refractory anemia with excess of blasts 2: Secondary | ICD-10-CM | POA: Diagnosis not present

## 2016-12-10 DIAGNOSIS — I129 Hypertensive chronic kidney disease with stage 1 through stage 4 chronic kidney disease, or unspecified chronic kidney disease: Secondary | ICD-10-CM | POA: Diagnosis not present

## 2016-12-10 DIAGNOSIS — Z87891 Personal history of nicotine dependence: Secondary | ICD-10-CM | POA: Diagnosis not present

## 2016-12-10 DIAGNOSIS — D649 Anemia, unspecified: Secondary | ICD-10-CM

## 2016-12-10 DIAGNOSIS — E785 Hyperlipidemia, unspecified: Secondary | ICD-10-CM | POA: Diagnosis not present

## 2016-12-10 DIAGNOSIS — G4730 Sleep apnea, unspecified: Secondary | ICD-10-CM | POA: Diagnosis not present

## 2016-12-10 DIAGNOSIS — D469 Myelodysplastic syndrome, unspecified: Secondary | ICD-10-CM

## 2016-12-10 DIAGNOSIS — Z87442 Personal history of urinary calculi: Secondary | ICD-10-CM | POA: Diagnosis not present

## 2016-12-10 DIAGNOSIS — Z7984 Long term (current) use of oral hypoglycemic drugs: Secondary | ICD-10-CM | POA: Diagnosis not present

## 2016-12-10 DIAGNOSIS — R5382 Chronic fatigue, unspecified: Secondary | ICD-10-CM | POA: Diagnosis not present

## 2016-12-10 DIAGNOSIS — D46Z Other myelodysplastic syndromes: Secondary | ICD-10-CM

## 2016-12-10 MED ORDER — SODIUM CHLORIDE 0.9 % IV SOLN
250.0000 mL | Freq: Once | INTRAVENOUS | Status: AC
  Administered 2016-12-10: 250 mL via INTRAVENOUS
  Filled 2016-12-10: qty 250

## 2016-12-10 MED ORDER — ONDANSETRON HCL 4 MG PO TABS
8.0000 mg | ORAL_TABLET | Freq: Once | ORAL | Status: AC
  Administered 2016-12-10: 8 mg via ORAL
  Filled 2016-12-10: qty 2

## 2016-12-10 MED ORDER — AZACITIDINE CHEMO SQ INJECTION
130.0000 mg | Freq: Once | INTRAMUSCULAR | Status: AC
  Administered 2016-12-10: 130 mg via SUBCUTANEOUS
  Filled 2016-12-10: qty 5.2

## 2016-12-10 MED ORDER — ACETAMINOPHEN 325 MG PO TABS
650.0000 mg | ORAL_TABLET | Freq: Once | ORAL | Status: AC
  Administered 2016-12-10: 650 mg via ORAL
  Filled 2016-12-10: qty 2

## 2016-12-10 MED ORDER — DIPHENHYDRAMINE HCL 25 MG PO CAPS
25.0000 mg | ORAL_CAPSULE | Freq: Once | ORAL | Status: AC
  Administered 2016-12-10: 25 mg via ORAL
  Filled 2016-12-10: qty 1

## 2016-12-11 ENCOUNTER — Inpatient Hospital Stay: Payer: Medicare Other

## 2016-12-11 ENCOUNTER — Other Ambulatory Visit: Payer: Self-pay | Admitting: *Deleted

## 2016-12-11 VITALS — BP 111/61 | HR 72 | Temp 97.2°F | Resp 18

## 2016-12-11 DIAGNOSIS — R5382 Chronic fatigue, unspecified: Secondary | ICD-10-CM | POA: Diagnosis not present

## 2016-12-11 DIAGNOSIS — Z792 Long term (current) use of antibiotics: Secondary | ICD-10-CM | POA: Diagnosis not present

## 2016-12-11 DIAGNOSIS — I129 Hypertensive chronic kidney disease with stage 1 through stage 4 chronic kidney disease, or unspecified chronic kidney disease: Secondary | ICD-10-CM | POA: Diagnosis not present

## 2016-12-11 DIAGNOSIS — N183 Chronic kidney disease, stage 3 (moderate): Secondary | ICD-10-CM | POA: Diagnosis not present

## 2016-12-11 DIAGNOSIS — Z7984 Long term (current) use of oral hypoglycemic drugs: Secondary | ICD-10-CM | POA: Diagnosis not present

## 2016-12-11 DIAGNOSIS — K219 Gastro-esophageal reflux disease without esophagitis: Secondary | ICD-10-CM | POA: Diagnosis not present

## 2016-12-11 DIAGNOSIS — R0609 Other forms of dyspnea: Secondary | ICD-10-CM | POA: Diagnosis not present

## 2016-12-11 DIAGNOSIS — E785 Hyperlipidemia, unspecified: Secondary | ICD-10-CM | POA: Diagnosis not present

## 2016-12-11 DIAGNOSIS — Z87891 Personal history of nicotine dependence: Secondary | ICD-10-CM | POA: Diagnosis not present

## 2016-12-11 DIAGNOSIS — G4730 Sleep apnea, unspecified: Secondary | ICD-10-CM | POA: Diagnosis not present

## 2016-12-11 DIAGNOSIS — D46Z Other myelodysplastic syndromes: Secondary | ICD-10-CM

## 2016-12-11 DIAGNOSIS — Z79899 Other long term (current) drug therapy: Secondary | ICD-10-CM | POA: Diagnosis not present

## 2016-12-11 DIAGNOSIS — D4622 Refractory anemia with excess of blasts 2: Secondary | ICD-10-CM | POA: Diagnosis not present

## 2016-12-11 DIAGNOSIS — R2241 Localized swelling, mass and lump, right lower limb: Secondary | ICD-10-CM | POA: Diagnosis not present

## 2016-12-11 DIAGNOSIS — R2231 Localized swelling, mass and lump, right upper limb: Secondary | ICD-10-CM | POA: Diagnosis not present

## 2016-12-11 DIAGNOSIS — Z87442 Personal history of urinary calculi: Secondary | ICD-10-CM | POA: Diagnosis not present

## 2016-12-11 DIAGNOSIS — R231 Pallor: Secondary | ICD-10-CM | POA: Diagnosis not present

## 2016-12-11 DIAGNOSIS — Z5111 Encounter for antineoplastic chemotherapy: Secondary | ICD-10-CM | POA: Diagnosis not present

## 2016-12-11 DIAGNOSIS — E1122 Type 2 diabetes mellitus with diabetic chronic kidney disease: Secondary | ICD-10-CM | POA: Diagnosis not present

## 2016-12-11 LAB — BPAM RBC
BLOOD PRODUCT EXPIRATION DATE: 201804232359
Blood Product Expiration Date: 201804232359
ISSUE DATE / TIME: 201804030954
ISSUE DATE / TIME: 201804031147
UNIT TYPE AND RH: 6200
Unit Type and Rh: 6200

## 2016-12-11 LAB — TYPE AND SCREEN
ABO/RH(D): A POS
ANTIBODY SCREEN: NEGATIVE
Unit division: 0
Unit division: 0

## 2016-12-11 MED ORDER — ONDANSETRON HCL 4 MG PO TABS
8.0000 mg | ORAL_TABLET | Freq: Once | ORAL | Status: AC
  Administered 2016-12-11: 8 mg via ORAL
  Filled 2016-12-11: qty 2

## 2016-12-11 MED ORDER — AZACITIDINE CHEMO SQ INJECTION
130.0000 mg | Freq: Once | INTRAMUSCULAR | Status: AC
  Administered 2016-12-11: 130 mg via SUBCUTANEOUS
  Filled 2016-12-11: qty 5.2

## 2016-12-12 ENCOUNTER — Inpatient Hospital Stay: Payer: Medicare Other

## 2016-12-12 VITALS — BP 125/75 | HR 65 | Temp 98.2°F | Resp 18

## 2016-12-12 DIAGNOSIS — R2241 Localized swelling, mass and lump, right lower limb: Secondary | ICD-10-CM | POA: Diagnosis not present

## 2016-12-12 DIAGNOSIS — E1122 Type 2 diabetes mellitus with diabetic chronic kidney disease: Secondary | ICD-10-CM | POA: Diagnosis not present

## 2016-12-12 DIAGNOSIS — I129 Hypertensive chronic kidney disease with stage 1 through stage 4 chronic kidney disease, or unspecified chronic kidney disease: Secondary | ICD-10-CM | POA: Diagnosis not present

## 2016-12-12 DIAGNOSIS — D4622 Refractory anemia with excess of blasts 2: Secondary | ICD-10-CM | POA: Diagnosis not present

## 2016-12-12 DIAGNOSIS — R2231 Localized swelling, mass and lump, right upper limb: Secondary | ICD-10-CM | POA: Diagnosis not present

## 2016-12-12 DIAGNOSIS — D46Z Other myelodysplastic syndromes: Secondary | ICD-10-CM

## 2016-12-12 DIAGNOSIS — K219 Gastro-esophageal reflux disease without esophagitis: Secondary | ICD-10-CM | POA: Diagnosis not present

## 2016-12-12 DIAGNOSIS — G4730 Sleep apnea, unspecified: Secondary | ICD-10-CM | POA: Diagnosis not present

## 2016-12-12 DIAGNOSIS — N183 Chronic kidney disease, stage 3 (moderate): Secondary | ICD-10-CM | POA: Diagnosis not present

## 2016-12-12 DIAGNOSIS — Z79899 Other long term (current) drug therapy: Secondary | ICD-10-CM | POA: Diagnosis not present

## 2016-12-12 DIAGNOSIS — E785 Hyperlipidemia, unspecified: Secondary | ICD-10-CM | POA: Diagnosis not present

## 2016-12-12 DIAGNOSIS — Z792 Long term (current) use of antibiotics: Secondary | ICD-10-CM | POA: Diagnosis not present

## 2016-12-12 DIAGNOSIS — Z87891 Personal history of nicotine dependence: Secondary | ICD-10-CM | POA: Diagnosis not present

## 2016-12-12 DIAGNOSIS — Z87442 Personal history of urinary calculi: Secondary | ICD-10-CM | POA: Diagnosis not present

## 2016-12-12 DIAGNOSIS — Z7984 Long term (current) use of oral hypoglycemic drugs: Secondary | ICD-10-CM | POA: Diagnosis not present

## 2016-12-12 DIAGNOSIS — Z5111 Encounter for antineoplastic chemotherapy: Secondary | ICD-10-CM | POA: Diagnosis not present

## 2016-12-12 DIAGNOSIS — R0609 Other forms of dyspnea: Secondary | ICD-10-CM | POA: Diagnosis not present

## 2016-12-12 DIAGNOSIS — R231 Pallor: Secondary | ICD-10-CM | POA: Diagnosis not present

## 2016-12-12 DIAGNOSIS — R5382 Chronic fatigue, unspecified: Secondary | ICD-10-CM | POA: Diagnosis not present

## 2016-12-12 MED ORDER — AZACITIDINE CHEMO SQ INJECTION
130.0000 mg | Freq: Once | INTRAMUSCULAR | Status: AC
  Administered 2016-12-12: 130 mg via SUBCUTANEOUS
  Filled 2016-12-12: qty 5.2

## 2016-12-12 MED ORDER — ONDANSETRON HCL 4 MG PO TABS
8.0000 mg | ORAL_TABLET | Freq: Once | ORAL | Status: AC
  Administered 2016-12-12: 8 mg via ORAL
  Filled 2016-12-12: qty 2

## 2016-12-13 ENCOUNTER — Other Ambulatory Visit: Payer: Self-pay | Admitting: Radiology

## 2016-12-13 ENCOUNTER — Inpatient Hospital Stay: Payer: Medicare Other

## 2016-12-13 DIAGNOSIS — R2241 Localized swelling, mass and lump, right lower limb: Secondary | ICD-10-CM | POA: Diagnosis not present

## 2016-12-13 DIAGNOSIS — Z792 Long term (current) use of antibiotics: Secondary | ICD-10-CM | POA: Diagnosis not present

## 2016-12-13 DIAGNOSIS — Z7984 Long term (current) use of oral hypoglycemic drugs: Secondary | ICD-10-CM | POA: Diagnosis not present

## 2016-12-13 DIAGNOSIS — R0609 Other forms of dyspnea: Secondary | ICD-10-CM | POA: Diagnosis not present

## 2016-12-13 DIAGNOSIS — I129 Hypertensive chronic kidney disease with stage 1 through stage 4 chronic kidney disease, or unspecified chronic kidney disease: Secondary | ICD-10-CM | POA: Diagnosis not present

## 2016-12-13 DIAGNOSIS — Z5111 Encounter for antineoplastic chemotherapy: Secondary | ICD-10-CM | POA: Diagnosis not present

## 2016-12-13 DIAGNOSIS — R2231 Localized swelling, mass and lump, right upper limb: Secondary | ICD-10-CM | POA: Diagnosis not present

## 2016-12-13 DIAGNOSIS — R231 Pallor: Secondary | ICD-10-CM | POA: Diagnosis not present

## 2016-12-13 DIAGNOSIS — D4622 Refractory anemia with excess of blasts 2: Secondary | ICD-10-CM | POA: Diagnosis not present

## 2016-12-13 DIAGNOSIS — R5382 Chronic fatigue, unspecified: Secondary | ICD-10-CM | POA: Diagnosis not present

## 2016-12-13 DIAGNOSIS — D46Z Other myelodysplastic syndromes: Secondary | ICD-10-CM

## 2016-12-13 DIAGNOSIS — N183 Chronic kidney disease, stage 3 (moderate): Secondary | ICD-10-CM | POA: Diagnosis not present

## 2016-12-13 DIAGNOSIS — Z87891 Personal history of nicotine dependence: Secondary | ICD-10-CM | POA: Diagnosis not present

## 2016-12-13 DIAGNOSIS — K219 Gastro-esophageal reflux disease without esophagitis: Secondary | ICD-10-CM | POA: Diagnosis not present

## 2016-12-13 DIAGNOSIS — Z87442 Personal history of urinary calculi: Secondary | ICD-10-CM | POA: Diagnosis not present

## 2016-12-13 DIAGNOSIS — E1122 Type 2 diabetes mellitus with diabetic chronic kidney disease: Secondary | ICD-10-CM | POA: Diagnosis not present

## 2016-12-13 DIAGNOSIS — E785 Hyperlipidemia, unspecified: Secondary | ICD-10-CM | POA: Diagnosis not present

## 2016-12-13 DIAGNOSIS — Z79899 Other long term (current) drug therapy: Secondary | ICD-10-CM | POA: Diagnosis not present

## 2016-12-13 DIAGNOSIS — G4730 Sleep apnea, unspecified: Secondary | ICD-10-CM | POA: Diagnosis not present

## 2016-12-13 MED ORDER — AZACITIDINE CHEMO SQ INJECTION: 60.0000 mg/m2 | Freq: Once | INTRAMUSCULAR | Status: DC

## 2016-12-13 MED ORDER — ONDANSETRON HCL 4 MG PO TABS
8.0000 mg | ORAL_TABLET | Freq: Once | ORAL | Status: AC
  Administered 2016-12-13: 8 mg via ORAL
  Filled 2016-12-13: qty 2

## 2016-12-13 MED ORDER — AZACITIDINE CHEMO SQ INJECTION
130.0000 mg | Freq: Once | INTRAMUSCULAR | Status: AC
Start: 2016-12-13 — End: 2016-12-13
  Administered 2016-12-13: 130 mg via SUBCUTANEOUS
  Filled 2016-12-13: qty 5.2

## 2016-12-16 ENCOUNTER — Inpatient Hospital Stay: Payer: Medicare Other

## 2016-12-16 ENCOUNTER — Ambulatory Visit
Admission: RE | Admit: 2016-12-16 | Discharge: 2016-12-16 | Disposition: A | Discharge status: Home / Self Care | Payer: Medicare Other | Source: Ambulatory Visit | Attending: Internal Medicine | Admitting: Internal Medicine

## 2016-12-16 ENCOUNTER — Other Ambulatory Visit: Payer: Self-pay | Admitting: Internal Medicine

## 2016-12-16 VITALS — BP 135/77 | HR 70 | Resp 20

## 2016-12-16 DIAGNOSIS — D4622 Refractory anemia with excess of blasts 2: Secondary | ICD-10-CM | POA: Diagnosis not present

## 2016-12-16 DIAGNOSIS — K219 Gastro-esophageal reflux disease without esophagitis: Secondary | ICD-10-CM | POA: Diagnosis not present

## 2016-12-16 DIAGNOSIS — R5382 Chronic fatigue, unspecified: Secondary | ICD-10-CM | POA: Diagnosis not present

## 2016-12-16 DIAGNOSIS — R2241 Localized swelling, mass and lump, right lower limb: Secondary | ICD-10-CM | POA: Diagnosis not present

## 2016-12-16 DIAGNOSIS — E785 Hyperlipidemia, unspecified: Secondary | ICD-10-CM | POA: Diagnosis not present

## 2016-12-16 DIAGNOSIS — R231 Pallor: Secondary | ICD-10-CM | POA: Diagnosis not present

## 2016-12-16 DIAGNOSIS — D46Z Other myelodysplastic syndromes: Secondary | ICD-10-CM | POA: Diagnosis not present

## 2016-12-16 DIAGNOSIS — M793 Panniculitis, unspecified: Secondary | ICD-10-CM | POA: Diagnosis not present

## 2016-12-16 DIAGNOSIS — Z5111 Encounter for antineoplastic chemotherapy: Secondary | ICD-10-CM | POA: Diagnosis not present

## 2016-12-16 DIAGNOSIS — I129 Hypertensive chronic kidney disease with stage 1 through stage 4 chronic kidney disease, or unspecified chronic kidney disease: Secondary | ICD-10-CM | POA: Diagnosis not present

## 2016-12-16 DIAGNOSIS — G4730 Sleep apnea, unspecified: Secondary | ICD-10-CM | POA: Diagnosis not present

## 2016-12-16 DIAGNOSIS — Z87891 Personal history of nicotine dependence: Secondary | ICD-10-CM | POA: Diagnosis not present

## 2016-12-16 DIAGNOSIS — R0609 Other forms of dyspnea: Secondary | ICD-10-CM | POA: Diagnosis not present

## 2016-12-16 DIAGNOSIS — C9500 Acute leukemia of unspecified cell type not having achieved remission: Secondary | ICD-10-CM | POA: Diagnosis not present

## 2016-12-16 DIAGNOSIS — R2231 Localized swelling, mass and lump, right upper limb: Secondary | ICD-10-CM | POA: Diagnosis not present

## 2016-12-16 DIAGNOSIS — Z7984 Long term (current) use of oral hypoglycemic drugs: Secondary | ICD-10-CM | POA: Diagnosis not present

## 2016-12-16 DIAGNOSIS — Z79899 Other long term (current) drug therapy: Secondary | ICD-10-CM | POA: Diagnosis not present

## 2016-12-16 DIAGNOSIS — E1122 Type 2 diabetes mellitus with diabetic chronic kidney disease: Secondary | ICD-10-CM | POA: Diagnosis not present

## 2016-12-16 DIAGNOSIS — R229 Localized swelling, mass and lump, unspecified: Secondary | ICD-10-CM

## 2016-12-16 DIAGNOSIS — Z87442 Personal history of urinary calculi: Secondary | ICD-10-CM | POA: Diagnosis not present

## 2016-12-16 DIAGNOSIS — Z792 Long term (current) use of antibiotics: Secondary | ICD-10-CM | POA: Diagnosis not present

## 2016-12-16 DIAGNOSIS — N183 Chronic kidney disease, stage 3 (moderate): Secondary | ICD-10-CM | POA: Diagnosis not present

## 2016-12-16 LAB — CBC WITH DIFFERENTIAL/PLATELET
Basophils Absolute: 0 10*3/uL (ref 0–0.1)
Basophils Relative: 1 %
Eosinophils Absolute: 0 10*3/uL (ref 0–0.7)
Eosinophils Relative: 0 %
HCT: 31.3 % — ABNORMAL LOW (ref 35.0–47.0)
Hemoglobin: 10.5 g/dL — ABNORMAL LOW (ref 12.0–16.0)
Lymphocytes Relative: 42 %
Lymphs Abs: 0.7 10*3/uL — ABNORMAL LOW (ref 1.0–3.6)
MCH: 31.8 pg (ref 26.0–34.0)
MCHC: 33.6 g/dL (ref 32.0–36.0)
MCV: 94.6 fL (ref 80.0–100.0)
Monocytes Absolute: 0.1 10*3/uL — ABNORMAL LOW (ref 0.2–0.9)
Monocytes Relative: 3 %
Neutro Abs: 0.9 10*3/uL — ABNORMAL LOW (ref 1.4–6.5)
Neutrophils Relative %: 54 %
Platelets: 114 10*3/uL — ABNORMAL LOW (ref 150–440)
RBC: 3.31 MIL/uL — ABNORMAL LOW (ref 3.80–5.20)
RDW: 19.8 % — ABNORMAL HIGH (ref 11.5–14.5)
WBC: 1.8 10*3/uL — ABNORMAL LOW (ref 3.6–11.0)

## 2016-12-16 LAB — BASIC METABOLIC PANEL WITH GFR
Anion gap: 9 (ref 5–15)
BUN: 16 mg/dL (ref 6–20)
CO2: 27 mmol/L (ref 22–32)
Calcium: 9.3 mg/dL (ref 8.9–10.3)
Chloride: 104 mmol/L (ref 101–111)
Creatinine, Ser: 1.04 mg/dL — ABNORMAL HIGH (ref 0.44–1.00)
GFR calc Af Amer: 59 mL/min — ABNORMAL LOW
GFR calc non Af Amer: 51 mL/min — ABNORMAL LOW
Glucose, Bld: 156 mg/dL — ABNORMAL HIGH (ref 65–99)
Potassium: 3.7 mmol/L (ref 3.5–5.1)
Sodium: 140 mmol/L (ref 135–145)

## 2016-12-16 LAB — SAMPLE TO BLOOD BANK

## 2016-12-16 MED ORDER — AZACITIDINE CHEMO SQ INJECTION
130.0000 mg | Freq: Once | INTRAMUSCULAR | Status: AC
  Administered 2016-12-16: 130 mg via SUBCUTANEOUS
  Filled 2016-12-16: qty 5.2

## 2016-12-16 MED ORDER — LEVOFLOXACIN 500 MG PO TABS: 500.0000 mg | ORAL_TABLET | Freq: Every day | ORAL | 0 refills | Status: DC

## 2016-12-16 MED ORDER — SODIUM CHLORIDE 0.9 % IV SOLN: INTRAVENOUS | Status: DC

## 2016-12-16 MED ORDER — FENTANYL CITRATE (PF) 100 MCG/2ML IJ SOLN
INTRAMUSCULAR | Status: AC
  Filled 2016-12-16: qty 2

## 2016-12-16 MED ORDER — MIDAZOLAM HCL 5 MG/5ML IJ SOLN
INTRAMUSCULAR | Status: AC
  Filled 2016-12-16: qty 5

## 2016-12-16 MED ORDER — ONDANSETRON HCL 4 MG PO TABS
8.0000 mg | ORAL_TABLET | Freq: Once | ORAL | Status: AC
  Administered 2016-12-16: 8 mg via ORAL
  Filled 2016-12-16: qty 2

## 2016-12-16 NOTE — Progress Notes (Signed)
Interventional Radiology:  Patient presents today for assessment and biopsy of right antecubital and calf subcutaneous nodules.  History of high grade MDS/leukemia.  US shows 1.2-1.3 cm subcu nodules in right lateral antecubital region and posteriomedial calf.  Will plan to biopsy antecubital nodule, as it appears more solid and slightly larger/thicker by imaging.  Will only use local anesthetic.  No sedation necessary.  Risks and Benefits discussed with the patient including, but not limited to bleeding, infection or low yield requiring additional tests. All of the patient's questions were answered, patient is agreeable to proceed. Consent signed and in chart.  Venetia Night. Kathlene Cote, M.D Pager:  [phone]

## 2016-12-16 NOTE — OR Nursing (Signed)
Per Dr Kathlene Cote Labs from 4/9 adequate, no coags needed

## 2016-12-16 NOTE — OR Nursing (Signed)
Dr. Kathlene Cote and pt  decided due to pt difficult IV stick historically, he would just use local anesthetic and not start IV.

## 2016-12-16 NOTE — Procedures (Signed)
Interventional Radiology Procedure Note  Procedure:  US guided core biopsy of right antecubital subcutaneous nodule  Complications: None  Estimated Blood Loss: < 10 mL  Findings:  18 G core biopsy x 6 of right arm subcutaneous nodule. Samples submitted in formalin and on saline soaked Telfa.   Venetia Night. Kathlene Cote, M.D Pager:  [phone]

## 2016-12-17 ENCOUNTER — Inpatient Hospital Stay: Payer: Medicare Other

## 2016-12-17 VITALS — BP 138/83 | HR 62 | Temp 97.0°F | Resp 20

## 2016-12-17 DIAGNOSIS — R2241 Localized swelling, mass and lump, right lower limb: Secondary | ICD-10-CM | POA: Diagnosis not present

## 2016-12-17 DIAGNOSIS — Z5111 Encounter for antineoplastic chemotherapy: Secondary | ICD-10-CM | POA: Diagnosis not present

## 2016-12-17 DIAGNOSIS — G4730 Sleep apnea, unspecified: Secondary | ICD-10-CM | POA: Diagnosis not present

## 2016-12-17 DIAGNOSIS — I129 Hypertensive chronic kidney disease with stage 1 through stage 4 chronic kidney disease, or unspecified chronic kidney disease: Secondary | ICD-10-CM | POA: Diagnosis not present

## 2016-12-17 DIAGNOSIS — Z792 Long term (current) use of antibiotics: Secondary | ICD-10-CM | POA: Diagnosis not present

## 2016-12-17 DIAGNOSIS — D4622 Refractory anemia with excess of blasts 2: Secondary | ICD-10-CM | POA: Diagnosis not present

## 2016-12-17 DIAGNOSIS — R231 Pallor: Secondary | ICD-10-CM | POA: Diagnosis not present

## 2016-12-17 DIAGNOSIS — N183 Chronic kidney disease, stage 3 (moderate): Secondary | ICD-10-CM | POA: Diagnosis not present

## 2016-12-17 DIAGNOSIS — E785 Hyperlipidemia, unspecified: Secondary | ICD-10-CM | POA: Diagnosis not present

## 2016-12-17 DIAGNOSIS — K219 Gastro-esophageal reflux disease without esophagitis: Secondary | ICD-10-CM | POA: Diagnosis not present

## 2016-12-17 DIAGNOSIS — R2231 Localized swelling, mass and lump, right upper limb: Secondary | ICD-10-CM | POA: Diagnosis not present

## 2016-12-17 DIAGNOSIS — Z87891 Personal history of nicotine dependence: Secondary | ICD-10-CM | POA: Diagnosis not present

## 2016-12-17 DIAGNOSIS — Z7984 Long term (current) use of oral hypoglycemic drugs: Secondary | ICD-10-CM | POA: Diagnosis not present

## 2016-12-17 DIAGNOSIS — D46Z Other myelodysplastic syndromes: Secondary | ICD-10-CM

## 2016-12-17 DIAGNOSIS — R5382 Chronic fatigue, unspecified: Secondary | ICD-10-CM | POA: Diagnosis not present

## 2016-12-17 DIAGNOSIS — Z79899 Other long term (current) drug therapy: Secondary | ICD-10-CM | POA: Diagnosis not present

## 2016-12-17 DIAGNOSIS — R0609 Other forms of dyspnea: Secondary | ICD-10-CM | POA: Diagnosis not present

## 2016-12-17 DIAGNOSIS — Z87442 Personal history of urinary calculi: Secondary | ICD-10-CM | POA: Diagnosis not present

## 2016-12-17 DIAGNOSIS — E1122 Type 2 diabetes mellitus with diabetic chronic kidney disease: Secondary | ICD-10-CM | POA: Diagnosis not present

## 2016-12-17 MED ORDER — AZACITIDINE CHEMO SQ INJECTION
130.0000 mg | Freq: Once | INTRAMUSCULAR | Status: AC
  Administered 2016-12-17: 130 mg via SUBCUTANEOUS
  Filled 2016-12-17: qty 5.2

## 2016-12-17 MED ORDER — ONDANSETRON HCL 4 MG PO TABS
8.0000 mg | ORAL_TABLET | Freq: Once | ORAL | Status: AC
Start: 2016-12-17 — End: 2016-12-17
  Administered 2016-12-17: 8 mg via ORAL
  Filled 2016-12-17: qty 2

## 2016-12-18 LAB — SURGICAL PATHOLOGY

## 2016-12-23 ENCOUNTER — Encounter: Payer: Self-pay | Admitting: Internal Medicine

## 2016-12-23 ENCOUNTER — Inpatient Hospital Stay: Payer: Medicare Other

## 2016-12-23 ENCOUNTER — Inpatient Hospital Stay (HOSPITAL_BASED_OUTPATIENT_CLINIC_OR_DEPARTMENT_OTHER): Payer: Medicare Other | Admitting: Internal Medicine

## 2016-12-23 VITALS — BP 151/70 | HR 68 | Temp 97.5°F | Wt 238.1 lb

## 2016-12-23 DIAGNOSIS — K219 Gastro-esophageal reflux disease without esophagitis: Secondary | ICD-10-CM

## 2016-12-23 DIAGNOSIS — Z79899 Other long term (current) drug therapy: Secondary | ICD-10-CM | POA: Diagnosis not present

## 2016-12-23 DIAGNOSIS — Z87891 Personal history of nicotine dependence: Secondary | ICD-10-CM

## 2016-12-23 DIAGNOSIS — E1122 Type 2 diabetes mellitus with diabetic chronic kidney disease: Secondary | ICD-10-CM | POA: Diagnosis not present

## 2016-12-23 DIAGNOSIS — Z5111 Encounter for antineoplastic chemotherapy: Secondary | ICD-10-CM | POA: Diagnosis not present

## 2016-12-23 DIAGNOSIS — R231 Pallor: Secondary | ICD-10-CM

## 2016-12-23 DIAGNOSIS — I129 Hypertensive chronic kidney disease with stage 1 through stage 4 chronic kidney disease, or unspecified chronic kidney disease: Secondary | ICD-10-CM

## 2016-12-23 DIAGNOSIS — R2231 Localized swelling, mass and lump, right upper limb: Secondary | ICD-10-CM | POA: Diagnosis not present

## 2016-12-23 DIAGNOSIS — N183 Chronic kidney disease, stage 3 (moderate): Secondary | ICD-10-CM

## 2016-12-23 DIAGNOSIS — D4622 Refractory anemia with excess of blasts 2: Secondary | ICD-10-CM

## 2016-12-23 DIAGNOSIS — D46Z Other myelodysplastic syndromes: Secondary | ICD-10-CM

## 2016-12-23 DIAGNOSIS — Z87442 Personal history of urinary calculi: Secondary | ICD-10-CM

## 2016-12-23 DIAGNOSIS — R0609 Other forms of dyspnea: Secondary | ICD-10-CM | POA: Diagnosis not present

## 2016-12-23 DIAGNOSIS — E669 Obesity, unspecified: Secondary | ICD-10-CM

## 2016-12-23 DIAGNOSIS — Z792 Long term (current) use of antibiotics: Secondary | ICD-10-CM | POA: Diagnosis not present

## 2016-12-23 DIAGNOSIS — E785 Hyperlipidemia, unspecified: Secondary | ICD-10-CM

## 2016-12-23 DIAGNOSIS — R2241 Localized swelling, mass and lump, right lower limb: Secondary | ICD-10-CM

## 2016-12-23 DIAGNOSIS — G4730 Sleep apnea, unspecified: Secondary | ICD-10-CM | POA: Diagnosis not present

## 2016-12-23 DIAGNOSIS — R5382 Chronic fatigue, unspecified: Secondary | ICD-10-CM

## 2016-12-23 DIAGNOSIS — Z7984 Long term (current) use of oral hypoglycemic drugs: Secondary | ICD-10-CM | POA: Diagnosis not present

## 2016-12-23 DIAGNOSIS — F418 Other specified anxiety disorders: Secondary | ICD-10-CM

## 2016-12-23 LAB — BASIC METABOLIC PANEL
ANION GAP: 3 — AB (ref 5–15)
BUN: 25 mg/dL — AB (ref 6–20)
CALCIUM: 9.1 mg/dL (ref 8.9–10.3)
CO2: 29 mmol/L (ref 22–32)
Chloride: 107 mmol/L (ref 101–111)
Creatinine, Ser: 0.91 mg/dL (ref 0.44–1.00)
GFR calc Af Amer: >60 mL/min (ref >60)
Glucose, Bld: 129 mg/dL — ABNORMAL HIGH (ref 65–99)
POTASSIUM: 4 mmol/L (ref 3.5–5.1)
SODIUM: 139 mmol/L (ref 135–145)

## 2016-12-23 LAB — CBC WITH DIFFERENTIAL/PLATELET
BASOS ABS: 0 10*3/uL (ref 0–0.1)
BASOS PCT: 0 %
EOS ABS: 0 10*3/uL (ref 0–0.7)
EOS PCT: 0 %
HCT: 27.2 % — ABNORMAL LOW (ref 35.0–47.0)
Hemoglobin: 9.4 g/dL — ABNORMAL LOW (ref 12.0–16.0)
Lymphocytes Relative: 39 %
Lymphs Abs: 1.1 10*3/uL (ref 1.0–3.6)
MCH: 32.2 pg (ref 26.0–34.0)
MCHC: 34.6 g/dL (ref 32.0–36.0)
MCV: 93.3 fL (ref 80.0–100.0)
Monocytes Absolute: 0.1 10*3/uL — ABNORMAL LOW (ref 0.2–0.9)
Monocytes Relative: 4 %
Neutro Abs: 1.5 10*3/uL (ref 1.4–6.5)
Neutrophils Relative %: 57 %
PLATELETS: 47 10*3/uL — AB (ref 150–440)
RBC: 2.92 MIL/uL — AB (ref 3.80–5.20)
RDW: 19.3 % — AB (ref 11.5–14.5)
WBC: 2.7 10*3/uL — AB (ref 3.6–11.0)

## 2016-12-23 LAB — SAMPLE TO BLOOD BANK

## 2016-12-23 NOTE — Progress Notes (Signed)
Patient here today for follow up.  Patient states no new concerns today  

## 2016-12-23 NOTE — Progress Notes (Signed)
Teresa Carrillo  Patient Care Team: Teresa Nova, [name]  CHIEF COMPLAINTS/PURPOSE OF CONSULTATION:   Oncology History   #JUNE 2017- Severe neutropenia/ Anemia ~hb 9/platlets- 85-100 AUG 2017- REFRACTORY ANEMIA with EXCESS BLASTS [14% blasts- BMBx]; cytogenetics/FISH-N [SNP micorarray- not done]   # AUG 21st-  START AZA 12m/m2 day- 1-7 q 28 days x4 cycles; DEC 6th- BMBx- <5% blasts; hypercellular with dysplasia.   # CKD stage III     MDS (myelodysplastic syndrome), high grade ([place]   04/23/2016 Initial Diagnosis    MDS (myelodysplastic syndrome), high grade (HCC)         HISTORY OF PRESENTING ILLNESS:  [name]89y.o.  female with above history of  MDS/high-grade currently started on Vidaza Status post cycle #8 [reduced by 20% sec to cytopenias] approximately 2 weeks ago is here for follow-up/To review the results of the subcutaneous nodule biopsy.  Overall she feels well. Denies any bleeding. Denies any fevers. Appetite is fair. No weight loss. Denies any swelling in the legs. Chronic mild fatigue not any worse.  She states her "knots"- are stable not any worse. She has been using ice packs.  ROS: A complete 10 point review of system is done which is negative except mentioned above in history of present illness.  MEDICAL HISTORY:  Past Medical History:  Diagnosis Date  . Abdominal wall mass   . [place]inal pain ([place]   . Anxiety   . Arthritis   . Calculus of kidney   . Cystitis   . Depression   . Diabetes mellitus without complication (HCC)    elevated A1c  . Dyspnea on exertion   . Elevated serum creatinine   . Fibrocystic breast disease   . GERD (gastroesophageal reflux disease)   . Hearing loss   . Heart murmur   . HTN (hypertension)   . Hyperlipidemia   . MDS (myelodysplastic syndrome), high grade ([place] 04/23/2016  . Microscopic hematuria   . Mucositis due to chemotherapy   . Obesity   . Risk for  falls   . Sleep apnea   . Thrombocytopenia ([place]   . Urinary frequency   . Urinary urgency     SURGICAL HISTORY: Past Surgical History:  Procedure Laterality Date  . ABDOMINAL HYSTERECTOMY    . APPENDECTOMY    . CARDIAC CATHETERIZATION     x2  . CHOLECYSTECTOMY    . COLONOSCOPY N/A 02/24/2015   Procedure: COLONOSCOPY;  Surgeon: RManya Silvas MD;  [facility];  Service: Endoscopy;  Laterality: N/A;  . DIAGNOSTIC LAPAROSCOPY     Removal of benign abdominal tumor  . ESOPHAGOGASTRODUODENOSCOPY N/A 02/24/2015   Procedure: ESOPHAGOGASTRODUODENOSCOPY (EGD);  Surgeon: RManya Silvas MD;  [facility];  Service: Endoscopy;  Laterality: N/A;  . right eye surgery Right     SOCIAL HISTORY: [place] family; [place] No smoking/ no alcohol.  Social History   Social History  . Marital status: Married    Spouse name: N/A  . Number of children: N/A  . Years of education: N/A   Occupational History  . Not on file.   Social History Main Topics  . Smoking status: Former Smoker    Quit date: 09/09/1988  . Smokeless tobacco: Never Used     Comment: quit 25 years ago  . Alcohol use No  . Drug use: No  . Sexual activity: Not on file  Other Topics Concern  . Not on file   Social History Narrative  . No narrative on file    FAMILY HISTORY: no cancers in family.  Family History  Problem Relation Age of Onset  . Congestive Heart Failure Mother   . Diabetes Mother   . Coronary artery disease Mother   . Stroke Mother   . Cirrhosis Father     ALLERGIES:  is allergic to macrobid [nitrofurantoin monohyd macro].  MEDICATIONS:  Current Outpatient Prescriptions  Medication Sig Dispense Refill  . acyclovir (ZOVIRAX) 400 MG tablet TAKE 1 TABLET(400 MG) BY MOUTH TWICE DAILY 120 tablet 0  . buPROPion (WELLBUTRIN XL) 150 MG 24 hr tablet Take 300 mg by mouth daily at 12 noon.     . clonazePAM (KLONOPIN) 0.5 MG tablet Take 0.5 mg by  mouth 2 (two) times daily.     . hydrocortisone (ANUSOL-HC) 2.5 % rectal cream Place 1 application rectally 2 (two) times daily as needed for hemorrhoids or itching. 30 g 0  . levofloxacin (LEVAQUIN) 500 MG tablet Take 1 tablet (500 mg total) by mouth daily. 14 tablet 0  . LUMIGAN 0.01 % SOLN Place 1 drop into both eyes at bedtime.     . magic mouthwash w/lidocaine SOLN Take 5 mLs by mouth 4 (four) times daily. 80 ml viscous lidocaine 2%, 80 ml Mylanta, 80 ml Diphenhydramine 12.5 mg/5 ml Elixir, 80 ml Nystatin 100,000 Unit suspension, 80 ml Prednisolone 15 mg/48m, 80 ml Distilled Water.  Sig: Swish/Swallow 5-10 ml four times a day as needed. Dispense 480 ml. 3RFs 480 mL 3  . metFORMIN (GLUCOPHAGE) 500 MG tablet TAKE 1 TABLET BY MOUTH DAILY 90 tablet 0  . Multiple Vitamin (MULTIVITAMIN WITH MINERALS) TABS tablet Take 1 tablet by mouth daily.    . Omega-3 Fatty Acids (FISH OIL) 1000 MG CAPS Take 1 capsule by mouth daily.    . ondansetron (ZOFRAN-ODT) 4 MG disintegrating tablet Take 4 mg by mouth every 8 (eight) hours as needed for nausea or vomiting.    . polyethylene glycol (MIRALAX / GLYCOLAX) packet Take 17 g by mouth daily as needed for mild constipation. 14 each 1  . prochlorperazine (COMPAZINE) 10 MG tablet Take 1 tablet (10 mg total) by mouth every 6 (six) hours as needed (Nausea or vomiting). 30 tablet 1  . rosuvastatin (CRESTOR) 20 MG tablet Take 1 tablet (20 mg total) by mouth at bedtime. 90 tablet 0  . sertraline (ZOLOFT) 100 MG tablet Take 100 mg by mouth daily at 12 noon.     .Marland KitchenUmeclidinium-Vilanterol (ANORO ELLIPTA) 62.5-25 MCG/INH AEPB Inhale 1 puff into the lungs daily as needed (shortness of breath).      No current facility-administered medications for this visit.       .Marland Kitchen PHYSICAL EXAMINATION: ECOG PERFORMANCE STATUS: 1 - Symptomatic but completely ambulatory  Vitals:   12/23/16 1136  BP: (!) 151/70  Pulse: 68  Temp: 97.5 F (36.4 C)   Filed Weights   12/23/16 1136   Weight: 238 lb 2 oz (108 kg)    GENERAL: Well-nourished well-developed; Alert, no distress and comfortable.   Obese. Accompanied by her Sister /husband. She is in a wheel chair.  EYES: Positive for pallor. OROPHARYNX: no thrush or ulceration;  NECK: supple, no masses felt LYMPH:  no palpable lymphadenopathy in the cervical, axillary or inguinal regions LUNGS: clear to auscultation and  No wheeze or crackles HEART/CVS: regular rate & rhythm and no murmurs; No lower extremity edema ABDOMEN:  abdomen soft, non-tender and normal bowel sounds Musculoskeletal:no cyanosis of digits and no clubbing  PSYCH: alert & oriented x 3 with fluent speech NEURO: no focal motor/sensory deficits SKIN:  ~1.5cm subcutaneous nodules noted right arm and right leg. Mildly tender.   LABORATORY DATA:  I have reviewed the data as listed Lab Results  Component Value Date   WBC 2.7 (L) 12/23/2016   HGB 9.4 (L) 12/23/2016   HCT 27.2 (L) 12/23/2016   MCV 93.3 12/23/2016   PLT 47 (L) 12/23/2016    Recent Labs  05/07/16 1652  09/30/16 0942 10/28/16 0936  12/09/16 0929 12/16/16 1232 12/23/16 1108  NA  --   < > 139 141  < > 143 140 139  K  --   < > 4.9 4.4  < > 4.2 3.7 4.0  CL  --   < > 105 111  < > 109 104 107  CO2  --   < > 28 24  < > 26 27 29   GLUCOSE  --   < > 125* 135*  < > 138* 156* 129*  BUN  --   < > 30* 24*  < > 18 16 25*  CREATININE  --   < > 1.21* 1.00  < > 1.00 1.04* 0.91  CALCIUM  --   < > 9.5 9.1  < > 9.0 9.3 9.1  GFRNONAA  --   < > 43* 54*  < > 54* 51* >60  GFRAA  --   < > 49* >60  < > >60 59* >60  PROT 8.3*  < > 7.7 7.2  --  6.9  --   --   ALBUMIN 3.8  < > 4.1 3.7  --  3.7  --   --   AST 26  < > 24 17  --  19  --   --   ALT 25  < > 27 19  --  17  --   --   ALKPHOS 65  < > 77 77  --  73  --   --   BILITOT 0.6  < > 0.3 0.4  --  0.5  --   --   BILIDIR 0.1  --   --   --   --   --   --   --   IBILI 0.5  --   --   --   --   --   --   --   < > = values in this interval not  displayed.  RADIOGRAPHIC STUDIES: I have personally reviewed the radiological images as listed and agreed with the findings in the report. US Biopsy  Result Date: 12/16/2016 CLINICAL DATA:  History of high-grade myelodysplastic syndrome and development of subcutaneous soft tissue nodules of the right upper extremity and right calf. EXAM: ULTRASOUND GUIDED CORE BIOPSY OF RIGHT UPPER EXTREMITY SOFT TISSUE MASS MEDICATIONS: None PROCEDURE: The procedure, risks, benefits, and alternatives were explained to the patient. Questions regarding the procedure were encouraged and answered. The patient understands and consents to the procedure. A time out was performed prior to initiating the procedure. The right arm was prepped with chlorhexidine in a sterile fashion, and a sterile drape was applied covering the operative field. A sterile gown and sterile gloves were used for the procedure. Local anesthesia was provided with 1% Lidocaine. Ultrasound was used to localize a subcutaneous nodule of the right upper extremity. Under ultrasound guidance, a total of six 18 gauge core biopsy  samples were obtained and submitted on Telfa soaked in saline as well as formalin. COMPLICATIONS: None. FINDINGS: After initial ultrasound of subcutaneous nodules in the right lateral antecubital region and the posterior right calf, the antecubital nodular mass was chosen for biopsy as it appeared slightly larger in volume and also more solid compared to the calf lesion. Solid tissue was obtained. IMPRESSION: Ultrasound-guided core biopsy performed of a subcutaneous nodule in the right lateral antecubital region which measured approximately 1.3 cm in greatest diameter by ultrasound. Electronically Signed   By: Aletta Edouard M.D.   On: 12/16/2016 16:12   [place] Rt Upper Extrem Ltd Soft Tissue Non Vascular  Result Date: 12/16/2016 CLINICAL DATA:  Right arm soft tissue mass EXAM: ULTRASOUND RIGHT UPPER EXTREMITY LIMITED TECHNIQUE: Ultrasound  examination of the upper extremity soft tissues was performed in the area of clinical concern. COMPARISON:  None FINDINGS: 1.3 x 0.8 x 1.1 cm echogenic mass with a focal area of central hypoechogenicity in the subcutaneous fat. Small area of internal Doppler flow. Well-circumscribed margins. The mass is located along the volar lateral aspect of the proximal forearm just below the antecubital fossa. No other solid or cystic mass. IMPRESSION: Indeterminate solid 1.3 x 0.8 x 1.1 cm soft tissue mass in the proximal lateral aspect of the left forearm. Differential considerations include both benign and malignant etiology. Recommend tissue diagnosis given the patient's history of known malignancy. Electronically Signed   By: Kathreen Devoid   On: 12/16/2016 10:32   [place] Rt Lower Extrem Ltd Soft Tissue Non Vascular  Result Date: 12/16/2016 CLINICAL DATA:  Right lower extremity mass. EXAM: ULTRASOUND RIGHT LOWER EXTREMITY LIMITED TECHNIQUE: Ultrasound examination of the lower extremity soft tissues was performed in the area of clinical concern. COMPARISON:  None. FINDINGS: 1.3 x 0.6 x 1.3 cm complex cystic mass in the subcutaneous fat of the right lower leg in the upper medial calf region. 3 mm nodular hyperechoic area within the predominantly cystic mass. Small area of possible internal Doppler flow. No other solid or cystic masses.  No architectural distortion. IMPRESSION: 1.3 x 0.6 x 1.3 cm complex cystic mass in the subcutaneous fat of the upper medial right calf. Differential diagnosis includes abscess versus hematoma versus cutaneous manifestation of myelodysplastic syndrome. Electronically Signed   By: Kathreen Devoid   On: 12/16/2016 10:48    ASSESSMENT & PLAN:   MDS (myelodysplastic syndrome), high grade (HCC) Refractory anemia- with excess blasts- II [blasts percentage 14%] FISH- normal karyotype. On vidaza; s/p  cycle # 5 BMX- Partial response noted < 5% blasts noted; continued hyper-cellular bone marrow;  dysplastic changes. Patient is currently status post 8 cycles [ cut down dose by 20% with cycle #7.]  # Reviewed the results of the subcutaneous nodule of the right upper extremity- no obvious evidence of any malignancy [ leukemic cutis or chloroma]. Recommend ice packs fish of the antibiotics as recommended   # 10 ANC is 1.5. Hemoglobin 9.2. Platelets-53; patient is awaiting a bone marrow biopsy next week on April 23.  #CKD stage III-stable.  # Prophylactic antibiotics- acyclovir/ diflucan/ levofloxacin.     # weekly cbc/hold tube/ follow up with me on May 2nd/ treatment;  Appt at [facility] on May 14th.      Cammie Sickle, MD 12/23/2016 1:22 PM

## 2016-12-23 NOTE — Assessment & Plan Note (Addendum)
Refractory anemia- with excess blasts- II [blasts percentage 14%] FISH- normal karyotype. On vidaza; s/p  cycle # 5 BMX- Partial response noted < 5% blasts noted; continued hyper-cellular bone marrow; dysplastic changes. Patient is currently status post 8 cycles [ cut down dose by 20% with cycle #7.]  # Reviewed the results of the subcutaneous nodule of the right upper extremity- no obvious evidence of any malignancy [ leukemic cutis or chloroma]. Recommend ice packs fish of the antibiotics as recommended   # 10 ANC is 1.5. Hemoglobin 9.2. Platelets-53; patient is awaiting a bone marrow biopsy next week on April 23.  #CKD stage III-stable.  # Prophylactic antibiotics- acyclovir/ diflucan/ levofloxacin.     # weekly cbc/hold tube/ follow up with me on May 2nd/ treatment;  Appt at [facility] on May 14th.

## 2016-12-25 ENCOUNTER — Encounter: Payer: Self-pay | Admitting: Family Medicine

## 2016-12-25 ENCOUNTER — Ambulatory Visit (INDEPENDENT_AMBULATORY_CARE_PROVIDER_SITE_OTHER): Payer: Medicare Other | Admitting: Family Medicine

## 2016-12-25 VITALS — BP 134/78 | HR 93 | Temp 97.7°F | Resp 17 | Ht 64.0 in | Wt 239.7 lb

## 2016-12-25 DIAGNOSIS — Z8679 Personal history of other diseases of the circulatory system: Secondary | ICD-10-CM | POA: Diagnosis not present

## 2016-12-25 DIAGNOSIS — E1121 Type 2 diabetes mellitus with diabetic nephropathy: Secondary | ICD-10-CM | POA: Diagnosis not present

## 2016-12-25 DIAGNOSIS — E785 Hyperlipidemia, unspecified: Secondary | ICD-10-CM | POA: Diagnosis not present

## 2016-12-25 DIAGNOSIS — D46Z Other myelodysplastic syndromes: Secondary | ICD-10-CM

## 2016-12-25 LAB — GLUCOSE, POCT (MANUAL RESULT ENTRY): POC Glucose: 111 mg/dl — AB (ref 70–99)

## 2016-12-25 LAB — POCT GLYCOSYLATED HEMOGLOBIN (HGB A1C): HEMOGLOBIN A1C: 6.5

## 2016-12-25 MED ORDER — METFORMIN HCL 500 MG PO TABS: 500.0000 mg | ORAL_TABLET | Freq: Every day | ORAL | 0 refills | Status: DC

## 2016-12-25 NOTE — Progress Notes (Signed)
med

## 2016-12-25 NOTE — Progress Notes (Signed)
Name: Teresa Carrillo   MRN: [id number]    DOB: 1940-11-06   Date:12/25/2016       Progress Note  Subjective  Chief Complaint  Chief Complaint  Patient presents with  . Follow-up    Discuss Medications     Diabetes  She presents for her follow-up diabetic visit. She has type 2 diabetes mellitus. Her disease course has been stable. Pertinent negatives for diabetes include no fatigue, no foot paresthesias, no polydipsia and no polyuria. Current diabetic treatment includes oral agent (monotherapy). Her weight is stable. She is following a generally healthy diet. Her breakfast blood glucose range is generally 110-130 mg/dl. An ACE inhibitor/angiotensin II receptor blocker is being taken. Eye exam is current.  Hyperlipidemia  This is a chronic problem. The problem is controlled. Recent lipid tests were reviewed and are normal. Pertinent negatives include no leg pain or myalgias. Current antihyperlipidemic treatment includes statins.     Past Medical History:  Diagnosis Date  . Abdominal wall mass   . Anginal pain ([place])   . Anxiety   . Arthritis   . Calculus of kidney   . Cystitis   . Depression   . Diabetes mellitus without complication (HCC)    elevated A1c  . Dyspnea on exertion   . Elevated serum creatinine   . Fibrocystic breast disease   . GERD (gastroesophageal reflux disease)   . Hearing loss   . Heart murmur   . HTN (hypertension)   . Hyperlipidemia   . MDS (myelodysplastic syndrome), high grade ([place]) 04/23/2016  . Microscopic hematuria   . Mucositis due to chemotherapy   . Obesity   . Risk for falls   . Sleep apnea   . Thrombocytopenia ([place])   . Urinary frequency   . Urinary urgency     Past Surgical History:  Procedure Laterality Date  . ABDOMINAL HYSTERECTOMY    . APPENDECTOMY    . CARDIAC CATHETERIZATION     x2  . CHOLECYSTECTOMY    . COLONOSCOPY N/A 02/24/2015   Procedure: COLONOSCOPY;  Surgeon: Manya Silvas, MD;  [facility];  Service:  Endoscopy;  Laterality: N/A;  . DIAGNOSTIC LAPAROSCOPY     Removal of benign abdominal tumor  . ESOPHAGOGASTRODUODENOSCOPY N/A 02/24/2015   Procedure: ESOPHAGOGASTRODUODENOSCOPY (EGD);  Surgeon: Manya Silvas, MD;  [facility];  Service: Endoscopy;  Laterality: N/A;  . right eye surgery Right     Family History  Problem Relation Age of Onset  . Congestive Heart Failure Mother   . Diabetes Mother   . Coronary artery disease Mother   . Stroke Mother   . Cirrhosis Father     Social History   Social History  . Marital status: Married    Spouse name: N/A  . Number of children: N/A  . Years of education: N/A   Occupational History  . Not on file.   Social History Main Topics  . Smoking status: Former Smoker    Quit date: 09/09/1988  . Smokeless tobacco: Never Used     Comment: quit 25 years ago  . Alcohol use No  . Drug use: No  . Sexual activity: Not on file   Other Topics Concern  . Not on file   Social History Narrative  . No narrative on file     Current Outpatient Prescriptions:  .  acyclovir (ZOVIRAX) 400 MG tablet, TAKE 1 TABLET(400 MG) BY MOUTH TWICE DAILY, Disp: 120 tablet, Rfl: 0 .  buPROPion (WELLBUTRIN XL)  150 MG 24 hr tablet, Take 300 mg by mouth daily at 12 noon. , Disp: , Rfl:  .  clonazePAM (KLONOPIN) 0.5 MG tablet, Take 0.5 mg by mouth 2 (two) times daily. , Disp: , Rfl:  .  hydrocortisone (ANUSOL-HC) 2.5 % rectal cream, Place 1 application rectally 2 (two) times daily as needed for hemorrhoids or itching., Disp: 30 g, Rfl: 0 .  levofloxacin (LEVAQUIN) 500 MG tablet, Take 1 tablet (500 mg total) by mouth daily., Disp: 14 tablet, Rfl: 0 .  LUMIGAN 0.01 % SOLN, Place 1 drop into both eyes at bedtime. , Disp: , Rfl:  .  magic mouthwash w/lidocaine SOLN, Take 5 mLs by mouth 4 (four) times daily. 80 ml viscous lidocaine 2%, 80 ml Mylanta, 80 ml Diphenhydramine 12.5 mg/5 ml Elixir, 80 ml Nystatin 100,000 Unit suspension, 80 ml Prednisolone 15  mg/66ml, 80 ml Distilled Water.  Sig: Swish/Swallow 5-10 ml four times a day as needed. Dispense 480 ml. 3RFs, Disp: 480 mL, Rfl: 3 .  metFORMIN (GLUCOPHAGE) 500 MG tablet, TAKE 1 TABLET BY MOUTH DAILY, Disp: 90 tablet, Rfl: 0 .  Multiple Vitamin (MULTIVITAMIN WITH MINERALS) TABS tablet, Take 1 tablet by mouth daily., Disp: , Rfl:  .  Omega-3 Fatty Acids (FISH OIL) 1000 MG CAPS, Take 1 capsule by mouth daily., Disp: , Rfl:  .  ondansetron (ZOFRAN-ODT) 4 MG disintegrating tablet, Take 4 mg by mouth every 8 (eight) hours as needed for nausea or vomiting., Disp: , Rfl:  .  polyethylene glycol (MIRALAX / GLYCOLAX) packet, Take 17 g by mouth daily as needed for mild constipation., Disp: 14 each, Rfl: 1 .  prochlorperazine (COMPAZINE) 10 MG tablet, Take 1 tablet (10 mg total) by mouth every 6 (six) hours as needed (Nausea or vomiting)., Disp: 30 tablet, Rfl: 1 .  rosuvastatin (CRESTOR) 20 MG tablet, Take 1 tablet (20 mg total) by mouth at bedtime., Disp: 90 tablet, Rfl: 0 .  sertraline (ZOLOFT) 100 MG tablet, Take 100 mg by mouth daily at 12 noon. , Disp: , Rfl:  .  Umeclidinium-Vilanterol (ANORO ELLIPTA) 62.5-25 MCG/INH AEPB, Inhale 1 puff into the lungs daily as needed (shortness of breath). , Disp: , Rfl:   Allergies  Allergen Reactions  . Macrobid [employer] Macro] Other (See Comments)    Reaction: unknown     Review of Systems  Constitutional: Negative for fatigue.  Musculoskeletal: Negative for myalgias.  Endo/Heme/Allergies: Negative for polydipsia.      Objective  Vitals:   12/25/16 1028  BP: 134/78  Pulse: 93  Resp: 17  Temp: 97.7 F (36.5 C)  TempSrc: Oral  SpO2: 96%  Weight: 239 lb 11.2 oz (108.7 kg)  Height: 5\' 4"  (1.626 m)    Physical Exam  Constitutional: She is oriented to person, place, and time and well-developed, well-nourished, and in no distress.  HENT:  Head: Normocephalic and atraumatic.  Cardiovascular: Normal rate, regular rhythm and normal  heart sounds.   No murmur heard. Pulmonary/Chest: Effort normal and breath sounds normal. No respiratory distress.  Neurological: She is alert and oriented to person, place, and time.  Psychiatric: Mood, memory, affect and judgment normal.  Nursing note and vitals reviewed.      Assessment & Plan  1. Type 2 diabetes mellitus with diabetic nephropathy, without long-term current use of insulin (HCC) Point-of-care A1c 6.5%, well-controlled diabetes - POCT HgB A1C - POCT Glucose (CBG) - metFORMIN (GLUCOPHAGE) 500 MG tablet; Take 1 tablet (500 mg total) by mouth daily.  Dispense: 90 tablet; Refill: 0 - Urine Microalbumin w/creat. ratio  2. Dyslipidemia  - Lipid panel - COMPLETE METABOLIC PANEL WITH GFR  3. MDS (myelodysplastic syndrome), high grade ([place]) Notes reviewed in detail, patient follows up with hematology oncology.  4. H/O: HTN (hypertension) Normal blood pressure, antihypertensive meds were discontinued because of persistently low blood pressure   Sharonne Ricketts Asad A[facility] 12/25/2016 11:00 AM

## 2016-12-27 ENCOUNTER — Other Ambulatory Visit: Payer: Self-pay | Admitting: Radiology

## 2016-12-27 ENCOUNTER — Encounter: Payer: Self-pay | Admitting: Interventional Radiology

## 2016-12-28 ENCOUNTER — Other Ambulatory Visit: Payer: Self-pay | Admitting: Family Medicine

## 2016-12-28 NOTE — Telephone Encounter (Signed)
Receipt confirmed by pharmacy, but requested again; will resend Reviewed last Cr; Rx approved

## 2016-12-30 ENCOUNTER — Inpatient Hospital Stay: Payer: Medicare Other

## 2016-12-30 ENCOUNTER — Other Ambulatory Visit: Payer: Self-pay | Admitting: *Deleted

## 2016-12-30 ENCOUNTER — Telehealth: Payer: Self-pay | Admitting: *Deleted

## 2016-12-30 ENCOUNTER — Ambulatory Visit
Admission: RE | Admit: 2016-12-30 | Discharge: 2016-12-30 | Disposition: A | Discharge status: Home / Self Care | Payer: Medicare Other | Source: Ambulatory Visit | Attending: Internal Medicine | Admitting: Internal Medicine

## 2016-12-30 DIAGNOSIS — R2241 Localized swelling, mass and lump, right lower limb: Secondary | ICD-10-CM | POA: Diagnosis not present

## 2016-12-30 DIAGNOSIS — Z87442 Personal history of urinary calculi: Secondary | ICD-10-CM | POA: Diagnosis not present

## 2016-12-30 DIAGNOSIS — R2231 Localized swelling, mass and lump, right upper limb: Secondary | ICD-10-CM | POA: Diagnosis not present

## 2016-12-30 DIAGNOSIS — D46Z Other myelodysplastic syndromes: Secondary | ICD-10-CM

## 2016-12-30 DIAGNOSIS — D696 Thrombocytopenia, unspecified: Secondary | ICD-10-CM

## 2016-12-30 DIAGNOSIS — D469 Myelodysplastic syndrome, unspecified: Secondary | ICD-10-CM | POA: Diagnosis not present

## 2016-12-30 DIAGNOSIS — R5382 Chronic fatigue, unspecified: Secondary | ICD-10-CM | POA: Diagnosis not present

## 2016-12-30 DIAGNOSIS — I129 Hypertensive chronic kidney disease with stage 1 through stage 4 chronic kidney disease, or unspecified chronic kidney disease: Secondary | ICD-10-CM | POA: Diagnosis not present

## 2016-12-30 DIAGNOSIS — D4622 Refractory anemia with excess of blasts 2: Secondary | ICD-10-CM | POA: Diagnosis not present

## 2016-12-30 DIAGNOSIS — D7589 Other specified diseases of blood and blood-forming organs: Secondary | ICD-10-CM | POA: Diagnosis not present

## 2016-12-30 DIAGNOSIS — Z5111 Encounter for antineoplastic chemotherapy: Secondary | ICD-10-CM | POA: Diagnosis not present

## 2016-12-30 DIAGNOSIS — Z79899 Other long term (current) drug therapy: Secondary | ICD-10-CM | POA: Diagnosis not present

## 2016-12-30 DIAGNOSIS — Z87891 Personal history of nicotine dependence: Secondary | ICD-10-CM | POA: Diagnosis not present

## 2016-12-30 DIAGNOSIS — G4730 Sleep apnea, unspecified: Secondary | ICD-10-CM | POA: Diagnosis not present

## 2016-12-30 DIAGNOSIS — E1122 Type 2 diabetes mellitus with diabetic chronic kidney disease: Secondary | ICD-10-CM | POA: Diagnosis not present

## 2016-12-30 DIAGNOSIS — N183 Chronic kidney disease, stage 3 (moderate): Secondary | ICD-10-CM | POA: Diagnosis not present

## 2016-12-30 DIAGNOSIS — R231 Pallor: Secondary | ICD-10-CM | POA: Diagnosis not present

## 2016-12-30 DIAGNOSIS — E785 Hyperlipidemia, unspecified: Secondary | ICD-10-CM | POA: Diagnosis not present

## 2016-12-30 DIAGNOSIS — R0609 Other forms of dyspnea: Secondary | ICD-10-CM | POA: Diagnosis not present

## 2016-12-30 DIAGNOSIS — K219 Gastro-esophageal reflux disease without esophagitis: Secondary | ICD-10-CM | POA: Diagnosis not present

## 2016-12-30 DIAGNOSIS — Z7984 Long term (current) use of oral hypoglycemic drugs: Secondary | ICD-10-CM | POA: Diagnosis not present

## 2016-12-30 DIAGNOSIS — Z792 Long term (current) use of antibiotics: Secondary | ICD-10-CM | POA: Diagnosis not present

## 2016-12-30 LAB — CBC WITH DIFFERENTIAL/PLATELET
BASOS PCT: 0 %
Basophils Absolute: 0 10*3/uL (ref 0–0.1)
EOS ABS: 0 10*3/uL (ref 0–0.7)
EOS PCT: 0 %
HEMATOCRIT: 25.4 % — AB (ref 35.0–47.0)
HEMOGLOBIN: 8.8 g/dL — AB (ref 12.0–16.0)
Lymphocytes Relative: 31 %
Lymphs Abs: 1.5 10*3/uL (ref 1.0–3.6)
MCH: 32.7 pg (ref 26.0–34.0)
MCHC: 34.7 g/dL (ref 32.0–36.0)
MCV: 94.4 fL (ref 80.0–100.0)
MONO ABS: 0.2 10*3/uL (ref 0.2–0.9)
Monocytes Relative: 5 %
NEUTROS PCT: 64 %
Neutro Abs: 3 10*3/uL (ref 1.4–6.5)
Platelets: 9 10*3/uL — CL (ref 150–400)
RBC: 2.69 MIL/uL — AB (ref 3.80–5.20)
RDW: 19.4 % — ABNORMAL HIGH (ref 11.5–14.5)
WBC: 4.7 10*3/uL (ref 3.6–11.0)

## 2016-12-30 LAB — SAMPLE TO BLOOD BANK

## 2016-12-30 MED ORDER — MIDAZOLAM HCL 5 MG/5ML IJ SOLN
INTRAMUSCULAR | Status: AC | PRN
  Administered 2016-12-30 (×2): 1 mg via INTRAVENOUS

## 2016-12-30 MED ORDER — SODIUM CHLORIDE 0.9 % IV SOLN: INTRAVENOUS | Status: DC

## 2016-12-30 MED ORDER — SODIUM CHLORIDE 0.9 % IV SOLN: 250.0000 mL | Freq: Once | INTRAVENOUS | Status: DC

## 2016-12-30 MED ORDER — FENTANYL CITRATE (PF) 100 MCG/2ML IJ SOLN
INTRAMUSCULAR | Status: AC
  Filled 2016-12-30: qty 4

## 2016-12-30 MED ORDER — ACETAMINOPHEN 325 MG PO TABS
650.0000 mg | ORAL_TABLET | Freq: Once | ORAL | Status: AC
  Administered 2016-12-30: 650 mg via ORAL

## 2016-12-30 MED ORDER — MIDAZOLAM HCL 5 MG/5ML IJ SOLN
INTRAMUSCULAR | Status: AC
  Filled 2016-12-30: qty 5

## 2016-12-30 MED ORDER — DIPHENHYDRAMINE HCL 25 MG PO CAPS
25.0000 mg | ORAL_CAPSULE | Freq: Once | ORAL | Status: AC
  Administered 2016-12-30: 25 mg via ORAL

## 2016-12-30 MED ORDER — SODIUM CHLORIDE 0.9% FLUSH: 10.0000 mL | INTRAVENOUS | Status: DC | PRN

## 2016-12-30 MED ORDER — HEPARIN SOD (PORK) LOCK FLUSH 100 UNIT/ML IV SOLN: 250.0000 [IU] | INTRAVENOUS | Status: DC | PRN

## 2016-12-30 MED ORDER — SODIUM CHLORIDE 0.9% FLUSH: 3.0000 mL | INTRAVENOUS | Status: DC | PRN

## 2016-12-30 MED ORDER — DIPHENHYDRAMINE HCL 25 MG PO CAPS
ORAL_CAPSULE | ORAL | Status: AC
  Administered 2016-12-30: 25 mg via ORAL
  Filled 2016-12-30: qty 1

## 2016-12-30 MED ORDER — ACETAMINOPHEN 325 MG PO TABS
ORAL_TABLET | ORAL | Status: AC
  Filled 2016-12-30: qty 2

## 2016-12-30 MED ORDER — FENTANYL CITRATE (PF) 100 MCG/2ML IJ SOLN
INTRAMUSCULAR | Status: AC | PRN
  Administered 2016-12-30 (×2): 25 ug via INTRAVENOUS
  Administered 2016-12-30: 50 ug via INTRAVENOUS

## 2016-12-30 MED ORDER — HEPARIN SOD (PORK) LOCK FLUSH 100 UNIT/ML IV SOLN: 500.0000 [IU] | Freq: Every day | INTRAVENOUS | Status: DC | PRN

## 2016-12-30 NOTE — Telephone Encounter (Signed)
Per Dr Rogue Bussing: Patient needs 1 unit of platelets,  Left vm with pt requesting her to call our office asap to get this scheduled.  (infusion unable to accommodate, will need to be scheduled in same day surgery today)

## 2016-12-30 NOTE — OR Nursing (Signed)
Patient tolerated platelet transfusion well. Lower back dressing remains dry and intact. Voided sponts. Discharge to home via wheelchair accompanied by her sister.

## 2016-12-30 NOTE — Procedures (Signed)
CT-guided LEFT iliac bone marrow aspiration and core biopsy No complication No blood loss. See complete dictation in [facility] PACS  

## 2016-12-30 NOTE — OR Nursing (Signed)
Patient arrived to [facility] in stable condition. VSS. Lower back dressing dry and intact.

## 2016-12-30 NOTE — Telephone Encounter (Signed)
Lonnie from lab called with Platlete critical value of 10.

## 2016-12-30 NOTE — Progress Notes (Signed)
Transferred to SDS for transfusion of platelets.  Report given to SDS.

## 2016-12-31 LAB — BPAM PLATELET PHERESIS
Blood Product Expiration Date: 201804232359
ISSUE DATE / TIME: 201804231206
UNIT TYPE AND RH: 6200

## 2016-12-31 LAB — PREPARE PLATELET PHERESIS: UNIT DIVISION: 0

## 2017-01-06 ENCOUNTER — Inpatient Hospital Stay: Payer: Medicare Other

## 2017-01-06 ENCOUNTER — Inpatient Hospital Stay (HOSPITAL_BASED_OUTPATIENT_CLINIC_OR_DEPARTMENT_OTHER): Payer: Medicare Other | Admitting: Internal Medicine

## 2017-01-06 VITALS — BP 132/73 | HR 65 | Temp 98.2°F | Resp 16 | Wt 238.0 lb

## 2017-01-06 DIAGNOSIS — E785 Hyperlipidemia, unspecified: Secondary | ICD-10-CM

## 2017-01-06 DIAGNOSIS — G4730 Sleep apnea, unspecified: Secondary | ICD-10-CM | POA: Diagnosis not present

## 2017-01-06 DIAGNOSIS — I129 Hypertensive chronic kidney disease with stage 1 through stage 4 chronic kidney disease, or unspecified chronic kidney disease: Secondary | ICD-10-CM | POA: Diagnosis not present

## 2017-01-06 DIAGNOSIS — Z87442 Personal history of urinary calculi: Secondary | ICD-10-CM

## 2017-01-06 DIAGNOSIS — Z79899 Other long term (current) drug therapy: Secondary | ICD-10-CM | POA: Diagnosis not present

## 2017-01-06 DIAGNOSIS — K219 Gastro-esophageal reflux disease without esophagitis: Secondary | ICD-10-CM

## 2017-01-06 DIAGNOSIS — F418 Other specified anxiety disorders: Secondary | ICD-10-CM

## 2017-01-06 DIAGNOSIS — Z5111 Encounter for antineoplastic chemotherapy: Secondary | ICD-10-CM | POA: Diagnosis not present

## 2017-01-06 DIAGNOSIS — R5382 Chronic fatigue, unspecified: Secondary | ICD-10-CM | POA: Diagnosis not present

## 2017-01-06 DIAGNOSIS — D4622 Refractory anemia with excess of blasts 2: Secondary | ICD-10-CM

## 2017-01-06 DIAGNOSIS — R0609 Other forms of dyspnea: Secondary | ICD-10-CM

## 2017-01-06 DIAGNOSIS — D46Z Other myelodysplastic syndromes: Secondary | ICD-10-CM

## 2017-01-06 DIAGNOSIS — E669 Obesity, unspecified: Secondary | ICD-10-CM

## 2017-01-06 DIAGNOSIS — Z87891 Personal history of nicotine dependence: Secondary | ICD-10-CM

## 2017-01-06 DIAGNOSIS — Z7984 Long term (current) use of oral hypoglycemic drugs: Secondary | ICD-10-CM

## 2017-01-06 DIAGNOSIS — R2241 Localized swelling, mass and lump, right lower limb: Secondary | ICD-10-CM | POA: Diagnosis not present

## 2017-01-06 DIAGNOSIS — N183 Chronic kidney disease, stage 3 (moderate): Secondary | ICD-10-CM | POA: Diagnosis not present

## 2017-01-06 DIAGNOSIS — R2231 Localized swelling, mass and lump, right upper limb: Secondary | ICD-10-CM | POA: Diagnosis not present

## 2017-01-06 DIAGNOSIS — R231 Pallor: Secondary | ICD-10-CM | POA: Diagnosis not present

## 2017-01-06 DIAGNOSIS — Z792 Long term (current) use of antibiotics: Secondary | ICD-10-CM

## 2017-01-06 DIAGNOSIS — E1122 Type 2 diabetes mellitus with diabetic chronic kidney disease: Secondary | ICD-10-CM | POA: Diagnosis not present

## 2017-01-06 LAB — CBC WITH DIFFERENTIAL/PLATELET
Basophils Absolute: 0 10*3/uL (ref 0–0.1)
Basophils Relative: 1 %
EOS ABS: 0 10*3/uL (ref 0–0.7)
EOS PCT: 0 %
HEMATOCRIT: 25.8 % — AB (ref 35.0–47.0)
Hemoglobin: 8.7 g/dL — ABNORMAL LOW (ref 12.0–16.0)
LYMPHS ABS: 1.5 10*3/uL (ref 1.0–3.6)
Lymphocytes Relative: 34 %
MCH: 32.4 pg (ref 26.0–34.0)
MCHC: 33.9 g/dL (ref 32.0–36.0)
MCV: 95.6 fL (ref 80.0–100.0)
Monocytes Absolute: 0.2 10*3/uL (ref 0.2–0.9)
Monocytes Relative: 4 %
NEUTROS ABS: 2.7 10*3/uL (ref 1.4–6.5)
Neutrophils Relative %: 61 %
Platelets: 57 10*3/uL — ABNORMAL LOW (ref 150–440)
RBC: 2.7 MIL/uL — ABNORMAL LOW (ref 3.80–5.20)
RDW: 21.1 % — AB (ref 11.5–14.5)
WBC: 4.4 10*3/uL (ref 3.6–11.0)

## 2017-01-06 LAB — COMPREHENSIVE METABOLIC PANEL
ALBUMIN: 3.9 g/dL (ref 3.5–5.0)
ALT: 17 U/L (ref 14–54)
ANION GAP: 6 (ref 5–15)
AST: 20 U/L (ref 15–41)
Alkaline Phosphatase: 84 U/L (ref 38–126)
BILIRUBIN TOTAL: 0.5 mg/dL (ref 0.3–1.2)
BUN: 22 mg/dL — ABNORMAL HIGH (ref 6–20)
CO2: 27 mmol/L (ref 22–32)
Calcium: 9.3 mg/dL (ref 8.9–10.3)
Chloride: 108 mmol/L (ref 101–111)
Creatinine, Ser: 1.05 mg/dL — ABNORMAL HIGH (ref 0.44–1.00)
GFR calc Af Amer: 59 mL/min — ABNORMAL LOW (ref >60)
GFR calc non Af Amer: 51 mL/min — ABNORMAL LOW (ref >60)
GLUCOSE: 138 mg/dL — AB (ref 65–99)
POTASSIUM: 3.8 mmol/L (ref 3.5–5.1)
Sodium: 141 mmol/L (ref 135–145)
TOTAL PROTEIN: 7.5 g/dL (ref 6.5–8.1)

## 2017-01-06 LAB — SAMPLE TO BLOOD BANK

## 2017-01-06 NOTE — Progress Notes (Signed)
Patient here today for follow up.  Patient states no new concerns today  

## 2017-01-06 NOTE — Assessment & Plan Note (Addendum)
Refractory anemia- with excess blasts- II [blasts percentage 14%] FISH- normal karyotype. On vidaza; s/p  cycle # 8; on April 23rd 2018- BMX- Partial response noted [persistent high grade MDS; aspirate -sub-optimal for blasts evaluation; 90% hyper-cellular bone marrow; dysplastic changes]. As per my discussion with pathologist- overall bone marrow "improved " from previous marrows. Continue Vidaza.   # HOLD treatment #9 today; platelets- 23; Labs today reviewed; will plan starting next week.  However cut down the duration to 5 days instead of 7 days- sec to severe thrombocytopenia [platelets 9]  #  subcutaneous nodule of the right upper extremity- no obvious evidence of any malignancy [Leukemic cutis or chloroma]. Improved.   #CKD stage III-stable.  # Prophylactic antibiotics- acyclovir/ diflucan.   # weekly cbc/hold tube/ follow up covering MD June 4th/ treatment;  Appt at [facility] [Dr.Foster] on May 14th.; in appx 2 months with me/labs/vidaza.

## 2017-01-06 NOTE — Progress Notes (Signed)
[place] NOTE  Patient Care Team: Roselee Nova, [name]  CHIEF COMPLAINTS/PURPOSE OF CONSULTATION:   Oncology History   #JUNE 2017- Severe neutropenia/ Anemia ~hb 9/platlets- 85-100 AUG 2017- REFRACTORY ANEMIA with EXCESS BLASTS [14% blasts- BMBx]; cytogenetics/FISH-N [SNP micorarray- not done]   # AUG 21st-  START AZA 27m/m2 day- 1-7 q 28 days x4 cycles; DEC 6th- BMBx- <5% blasts; hypercellular with dysplasia.   # CKD stage III     MDS (myelodysplastic syndrome), high grade ([place]   04/23/2016 Initial Diagnosis    MDS (myelodysplastic syndrome), high grade (HCC)         HISTORY OF PRESENTING ILLNESS:  [name].  female with above history of  MDS/high-grade currently started on Vidaza Status post cycle #8 [reduced by 20% sec to cytopenias with cycle #4] approximately 2 weeks ago is here for follow-up/To review the results of the bone marrow biopsy.  Approximately week ago patient needed platelet transfusion for platelet count of about 9. Otherwise she has not needed any PRBC transfusions the last many months.  Overall she feels well. Denies any bleeding. Denies any fevers. Appetite is fair. No weight loss. Denies any swelling in the legs. Chronic mild fatigue not any worse.  Today she is walking herself.  ROS: A complete 10 point review of system is done which is negative except mentioned above in history of present illness.  MEDICAL HISTORY:  Past Medical History:  Diagnosis Date  . Abdominal wall mass   . Anginal pain ([place]   . Anxiety   . Arthritis   . Calculus of kidney   . Cystitis   . Depression   . Diabetes mellitus without complication (HCC)    elevated A1c  . Dyspnea on exertion   . Elevated serum creatinine   . Fibrocystic breast disease   . GERD (gastroesophageal reflux disease)   . Hearing loss   . Heart murmur   . HTN (hypertension)   . Hyperlipidemia   . MDS (myelodysplastic  syndrome), high grade ([place] 04/23/2016  . Microscopic hematuria   . Mucositis due to chemotherapy   . Obesity   . Risk for falls   . Sleep apnea   . Thrombocytopenia ([place]   . Urinary frequency   . Urinary urgency     SURGICAL HISTORY: Past Surgical History:  Procedure Laterality Date  . ABDOMINAL HYSTERECTOMY    . APPENDECTOMY    . CARDIAC CATHETERIZATION     x2  . CHOLECYSTECTOMY    . COLONOSCOPY N/A 02/24/2015   Procedure: COLONOSCOPY;  Surgeon: RManya Silvas MD;  [facility]  Service: Endoscopy;  Laterality: N/A;  . DIAGNOSTIC LAPAROSCOPY     Removal of benign abdominal tumor  . ESOPHAGOGASTRODUODENOSCOPY N/A 02/24/2015   Procedure: ESOPHAGOGASTRODUODENOSCOPY (EGD);  Surgeon: RManya Silvas MD;  [facility];  Service: Endoscopy;  Laterality: N/A;  . right eye surgery Right     SOCIAL HISTORY: [place] family; [place] No smoking/ no alcohol.  Social History   Social History  . Marital status: Married    Spouse name: N/A  . Number of children: N/A  . Years of education: N/A   Occupational History  . Not on file.   Social History Main Topics  . Smoking status: Former Smoker    Quit date: 09/09/1988  . Smokeless tobacco: Never Used     Comment: quit 25 years ago  .  Alcohol use No  . Drug use: No  . Sexual activity: Not on file   Other Topics Concern  . Not on file   Social History Narrative  . No narrative on file    FAMILY HISTORY: no cancers in family.  Family History  Problem Relation Age of Onset  . Congestive Heart Failure Mother   . Diabetes Mother   . Coronary artery disease Mother   . Stroke Mother   . Cirrhosis Father     ALLERGIES:  is allergic to macrobid [nitrofurantoin monohyd macro].  MEDICATIONS:  Current Outpatient Prescriptions  Medication Sig Dispense Refill  . acyclovir (ZOVIRAX) 400 MG tablet TAKE 1 TABLET(400 MG) BY MOUTH TWICE DAILY 120 tablet 0  . buPROPion  (WELLBUTRIN XL) 150 MG 24 hr tablet Take 300 mg by mouth daily at 12 noon.     . clonazePAM (KLONOPIN) 0.5 MG tablet Take 0.5 mg by mouth 2 (two) times daily.     . hydrocortisone (ANUSOL-HC) 2.5 % rectal cream Place 1 application rectally 2 (two) times daily as needed for hemorrhoids or itching. 30 g 0  . LUMIGAN 0.01 % SOLN Place 1 drop into both eyes at bedtime.     . magic mouthwash w/lidocaine SOLN Take 5 mLs by mouth 4 (four) times daily. 80 ml viscous lidocaine 2%, 80 ml Mylanta, 80 ml Diphenhydramine 12.5 mg/5 ml Elixir, 80 ml Nystatin 100,000 Unit suspension, 80 ml Prednisolone 15 mg/1m, 80 ml Distilled Water.  Sig: Swish/Swallow 5-10 ml four times a day as needed. Dispense 480 ml. 3RFs 480 mL 3  . metFORMIN (GLUCOPHAGE) 500 MG tablet TAKE 1 TABLET BY MOUTH DAILY 90 tablet 0  . Multiple Vitamin (MULTIVITAMIN WITH MINERALS) TABS tablet Take 1 tablet by mouth daily.    . Omega-3 Fatty Acids (FISH OIL) 1000 MG CAPS Take 1 capsule by mouth daily.    . ondansetron (ZOFRAN-ODT) 4 MG disintegrating tablet Take 4 mg by mouth every 8 (eight) hours as needed for nausea or vomiting.    . polyethylene glycol (MIRALAX / GLYCOLAX) packet Take 17 g by mouth daily as needed for mild constipation. 14 each 1  . prochlorperazine (COMPAZINE) 10 MG tablet Take 1 tablet (10 mg total) by mouth every 6 (six) hours as needed (Nausea or vomiting). 30 tablet 1  . rosuvastatin (CRESTOR) 20 MG tablet Take 1 tablet (20 mg total) by mouth at bedtime. 90 tablet 0  . sertraline (ZOLOFT) 100 MG tablet Take 100 mg by mouth daily at 12 noon.     .Marland KitchenUmeclidinium-Vilanterol (ANORO ELLIPTA) 62.5-25 MCG/INH AEPB Inhale 1 puff into the lungs daily as needed (shortness of breath).      No current facility-administered medications for this visit.       .Marland Kitchen PHYSICAL EXAMINATION: ECOG PERFORMANCE STATUS: 1 - Symptomatic but completely ambulatory  Vitals:   01/06/17 1005  BP: 132/73  Pulse: 65  Resp: 16  Temp: 98.2 F (36.8  C)   Filed Weights   01/06/17 1005  Weight: 238 lb (108 kg)    GENERAL: Well-nourished well-developed; Alert, no distress and comfortable.   Obese. Accompanied by her Sister /husband. She is NOT in  a wheel chair.  EYES: Positive for pallor. OROPHARYNX: no thrush or ulceration;  NECK: supple, no masses felt LYMPH:  no palpable lymphadenopathy in the cervical, axillary or inguinal regions LUNGS: clear to auscultation and  No wheeze or crackles HEART/CVS: regular rate & rhythm and no murmurs; No lower extremity edema ABDOMEN:  abdomen soft, non-tender and normal bowel sounds Musculoskeletal:no cyanosis of digits and no clubbing  PSYCH: alert & oriented x 3 with fluent speech NEURO: no focal motor/sensory deficits SKIN:  ~1.5cm subcutaneous nodules noted right arm and right leg [improving]   LABORATORY DATA:  I have reviewed the data as listed Lab Results  Component Value Date   WBC 4.4 01/06/2017   HGB 8.7 (L) 01/06/2017   HCT 25.8 (L) 01/06/2017   MCV 95.6 01/06/2017   PLT 57 (L) 01/06/2017    Recent Labs  05/07/16 1652  10/28/16 0936  12/09/16 0929 12/16/16 1232 12/23/16 1108 01/06/17 0916  NA  --   < > 141  < > 143 140 139 141  K  --   < > 4.4  < > 4.2 3.7 4.0 3.8  CL  --   < > 111  < > 109 104 107 108  CO2  --   < > 24  < > _0 GLUCOSE  --   < > 135*  < > 138* 156* 129* 138*  BUN  --   < > 24*  < > 18 16 25* 22*  CREATININE  --   < > 1.00  < > 1.00 1.04* 0.91 1.05*  CALCIUM  --   < > 9.1  < > 9.0 9.3 9.1 9.3  GFRNONAA  --   < > 54*  < > 54* 51* >60 51*  GFRAA  --   < > >60  < > >60 59* >60 59*  PROT 8.3*  < > 7.2  --  6.9  --   --  7.5  ALBUMIN 3.8  < > 3.7  --  3.7  --   --  3.9  AST 26  < > 17  --  19  --   --  20  ALT 25  < > 19  --  17  --   --  17  ALKPHOS 65  < > 77  --  73  --   --  84  BILITOT 0.6  < > 0.4  --  0.5  --   --  0.5  BILIDIR 0.1  --   --   --   --   --   --   --   IBILI 0.5  --   --   --   --   --   --   --   < > = values in  this interval not displayed.  RADIOGRAPHIC STUDIES: I have personally reviewed the radiological images as listed and agreed with the findings in the report. US Biopsy  Result Date: 12/16/2016 CLINICAL DATA:  History of high-grade myelodysplastic syndrome and development of subcutaneous soft tissue nodules of the right upper extremity and right calf. EXAM: ULTRASOUND GUIDED CORE BIOPSY OF RIGHT UPPER EXTREMITY SOFT TISSUE MASS MEDICATIONS: None PROCEDURE: The procedure, risks, benefits, and alternatives were explained to the patient. Questions regarding the procedure were encouraged and answered. The patient understands and consents to the procedure. A time out was performed prior to initiating the procedure. The right arm was prepped with chlorhexidine in a sterile fashion, and a sterile drape was applied covering the operative field. A sterile gown and sterile gloves were used for the procedure. Local anesthesia was provided with 1% Lidocaine. Ultrasound was used to localize a subcutaneous nodule of the right upper extremity. Under ultrasound guidance, a total of six 18 gauge core biopsy samples were  obtained and submitted on Telfa soaked in saline as well as formalin. COMPLICATIONS: None. FINDINGS: After initial ultrasound of subcutaneous nodules in the right lateral antecubital region and the posterior right calf, the antecubital nodular mass was chosen for biopsy as it appeared slightly larger in volume and also more solid compared to the calf lesion. Solid tissue was obtained. IMPRESSION: Ultrasound-guided core biopsy performed of a subcutaneous nodule in the right lateral antecubital region which measured approximately 1.3 cm in greatest diameter by ultrasound. Electronically Signed   By: Aletta Edouard M.D.   On: 12/16/2016 16:12   Ct Biopsy  Result Date: 12/30/2016 CLINICAL DATA:  Mild dysplastic syndrome, high-grade. Low platelets. EXAM: CT GUIDED DEEP ILIAC BONE ASPIRATION AND CORE BIOPSY  TECHNIQUE: The procedure, risks (including but not limited to bleeding, infection, organ damage ), benefits, and alternatives were explained to the patient. Questions regarding the procedure were encouraged and answered. The patient understands and consents to the procedure. Patient was placed supine on the CT gantry and limited axial scans through the pelvis were obtained. Appropriate skin entry site was identified. Skin site was marked, prepped with chlorhexidine, draped in usual sterile fashion, and infiltrated locally with 1% lidocaine. Intravenous Fentanyl and Versed were administered as conscious sedation during continuous monitoring of the patient's level of consciousness and physiological / cardiorespiratory status by the radiology RN, with a total moderate sedation time of 18 minutes. Under CT fluoroscopic guidance an 11-gauge Cook trocar bone needle was advanced into the left iliac bone just lateral to the sacroiliac joint. Once needle tip position was confirmed, coaxial core and aspiration samples were obtained. Post procedure scans show no hematoma or fracture. Patient tolerated procedure well. COMPLICATIONS: COMPLICATIONS none IMPRESSION: 1. Technically successful CT guided left iliac bone core and aspiration biopsy. Electronically Signed   By: Lucrezia Europe M.D.   On: 12/30/2016 12:07   [place] Rt Upper Extrem Ltd Soft Tissue Non Vascular  Result Date: 12/16/2016 CLINICAL DATA:  Right arm soft tissue mass EXAM: ULTRASOUND RIGHT UPPER EXTREMITY LIMITED TECHNIQUE: Ultrasound examination of the upper extremity soft tissues was performed in the area of clinical concern. COMPARISON:  None FINDINGS: 1.3 x 0.8 x 1.1 cm echogenic mass with a focal area of central hypoechogenicity in the subcutaneous fat. Small area of internal Doppler flow. Well-circumscribed margins. The mass is located along the volar lateral aspect of the proximal forearm just below the antecubital fossa. No other solid or cystic mass.  IMPRESSION: Indeterminate solid 1.3 x 0.8 x 1.1 cm soft tissue mass in the proximal lateral aspect of the left forearm. Differential considerations include both benign and malignant etiology. Recommend tissue diagnosis given the patient's history of known malignancy. Electronically Signed   By: Kathreen Devoid   On: 12/16/2016 10:32   [place] Rt Lower Extrem Ltd Soft Tissue Non Vascular  Result Date: 12/16/2016 CLINICAL DATA:  Right lower extremity mass. EXAM: ULTRASOUND RIGHT LOWER EXTREMITY LIMITED TECHNIQUE: Ultrasound examination of the lower extremity soft tissues was performed in the area of clinical concern. COMPARISON:  None. FINDINGS: 1.3 x 0.6 x 1.3 cm complex cystic mass in the subcutaneous fat of the right lower leg in the upper medial calf region. 3 mm nodular hyperechoic area within the predominantly cystic mass. Small area of possible internal Doppler flow. No other solid or cystic masses.  No architectural distortion. IMPRESSION: 1.3 x 0.6 x 1.3 cm complex cystic mass in the subcutaneous fat of the upper medial right calf. Differential diagnosis includes abscess versus hematoma  versus cutaneous manifestation of myelodysplastic syndrome. Electronically Signed   By: Kathreen Devoid   On: 12/16/2016 10:48    ASSESSMENT & PLAN:   MDS (myelodysplastic syndrome), high grade (HCC) Refractory anemia- with excess blasts- II [blasts percentage 14%] FISH- normal karyotype. On vidaza; s/p  cycle # 8; on April 23rd 2018- BMX- Partial response noted [persistent high grade MDS; aspirate -sub-optimal for blasts evaluation; 90% hyper-cellular bone marrow; dysplastic changes]. As per my discussion with pathologist- overall bone marrow "improved " from previous marrows. Continue Vidaza.   # HOLD treatment #9 today; platelets- 87; Labs today reviewed; will plan starting next week.  However cut down the duration to 5 days instead of 7 days- sec to severe thrombocytopenia [platelets 9]  #  subcutaneous nodule of the  right upper extremity- no obvious evidence of any malignancy [Leukemic cutis or chloroma]. Improved.   #CKD stage III-stable.  # Prophylactic antibiotics- acyclovir/ diflucan.   # weekly cbc/hold tube/ follow up covering MD June 4th/ treatment;  Appt at [facility] [Dr.Foster] on May 14th.; in appx 2 months with me/labs/vidaza.      Cammie Sickle, MD 01/07/2017 8:26 AM

## 2017-01-07 ENCOUNTER — Ambulatory Visit: Payer: Medicare Other

## 2017-01-08 ENCOUNTER — Ambulatory Visit: Payer: Medicare Other

## 2017-01-08 LAB — CHROMOSOME ANALYSIS, BONE MARROW

## 2017-01-08 LAB — TISSUE HYBRIDIZATION (BONE MARROW)-NCBH

## 2017-01-09 ENCOUNTER — Ambulatory Visit: Payer: Medicare Other

## 2017-01-10 ENCOUNTER — Other Ambulatory Visit: Payer: Self-pay | Admitting: Internal Medicine

## 2017-01-10 ENCOUNTER — Ambulatory Visit: Payer: Medicare Other

## 2017-01-13 ENCOUNTER — Inpatient Hospital Stay: Payer: Medicare Other

## 2017-01-13 ENCOUNTER — Inpatient Hospital Stay: Payer: Medicare Other | Attending: Internal Medicine

## 2017-01-13 VITALS — BP 135/74 | HR 68 | Temp 96.7°F | Resp 20

## 2017-01-13 DIAGNOSIS — Z79899 Other long term (current) drug therapy: Secondary | ICD-10-CM | POA: Diagnosis not present

## 2017-01-13 DIAGNOSIS — D46Z Other myelodysplastic syndromes: Secondary | ICD-10-CM

## 2017-01-13 DIAGNOSIS — D4622 Refractory anemia with excess of blasts 2: Secondary | ICD-10-CM | POA: Insufficient documentation

## 2017-01-13 LAB — COMPREHENSIVE METABOLIC PANEL
ALT: 24 U/L (ref 14–54)
AST: 24 U/L (ref 15–41)
Albumin: 3.8 g/dL (ref 3.5–5.0)
Alkaline Phosphatase: 83 U/L (ref 38–126)
Anion gap: 6 (ref 5–15)
BILIRUBIN TOTAL: 0.5 mg/dL (ref 0.3–1.2)
BUN: 21 mg/dL — AB (ref 6–20)
CO2: 26 mmol/L (ref 22–32)
Calcium: 9.2 mg/dL (ref 8.9–10.3)
Chloride: 108 mmol/L (ref 101–111)
Creatinine, Ser: 1.1 mg/dL — ABNORMAL HIGH (ref 0.44–1.00)
GFR calc non Af Amer: 48 mL/min — ABNORMAL LOW (ref >60)
GFR, EST AFRICAN AMERICAN: 55 mL/min — AB (ref >60)
Glucose, Bld: 158 mg/dL — ABNORMAL HIGH (ref 65–99)
POTASSIUM: 4.4 mmol/L (ref 3.5–5.1)
Sodium: 140 mmol/L (ref 135–145)
TOTAL PROTEIN: 7.5 g/dL (ref 6.5–8.1)

## 2017-01-13 LAB — CBC WITH DIFFERENTIAL/PLATELET
BASOS PCT: 0 %
Basophils Absolute: 0 10*3/uL (ref 0–0.1)
EOS ABS: 0 10*3/uL (ref 0–0.7)
Eosinophils Relative: 0 %
HCT: 26.1 % — ABNORMAL LOW (ref 35.0–47.0)
HEMOGLOBIN: 8.8 g/dL — AB (ref 12.0–16.0)
LYMPHS ABS: 1.4 10*3/uL (ref 1.0–3.6)
Lymphocytes Relative: 35 %
MCH: 32.6 pg (ref 26.0–34.0)
MCHC: 33.6 g/dL (ref 32.0–36.0)
MCV: 96.9 fL (ref 80.0–100.0)
MONO ABS: 0.2 10*3/uL (ref 0.2–0.9)
MONOS PCT: 5 %
Neutro Abs: 2.4 10*3/uL (ref 1.4–6.5)
Neutrophils Relative %: 60 %
PLATELETS: 109 10*3/uL — AB (ref 150–440)
RBC: 2.7 MIL/uL — ABNORMAL LOW (ref 3.80–5.20)
RDW: 21.3 % — ABNORMAL HIGH (ref 11.5–14.5)
WBC: 4 10*3/uL (ref 3.6–11.0)

## 2017-01-13 LAB — SAMPLE TO BLOOD BANK

## 2017-01-13 MED ORDER — ONDANSETRON HCL 4 MG PO TABS
8.0000 mg | ORAL_TABLET | Freq: Once | ORAL | Status: AC
  Administered 2017-01-13: 8 mg via ORAL
  Filled 2017-01-13: qty 2

## 2017-01-13 MED ORDER — AZACITIDINE CHEMO SQ INJECTION
130.0000 mg | Freq: Once | INTRAMUSCULAR | Status: AC
  Administered 2017-01-13: 130 mg via SUBCUTANEOUS
  Filled 2017-01-13: qty 5.2

## 2017-01-14 ENCOUNTER — Inpatient Hospital Stay: Payer: Medicare Other

## 2017-01-14 VITALS — BP 133/74 | HR 70 | Temp 98.0°F | Resp 20

## 2017-01-14 DIAGNOSIS — Z79899 Other long term (current) drug therapy: Secondary | ICD-10-CM | POA: Diagnosis not present

## 2017-01-14 DIAGNOSIS — D46Z Other myelodysplastic syndromes: Secondary | ICD-10-CM

## 2017-01-14 DIAGNOSIS — D4622 Refractory anemia with excess of blasts 2: Secondary | ICD-10-CM | POA: Diagnosis not present

## 2017-01-14 MED ORDER — AZACITIDINE CHEMO SQ INJECTION
130.0000 mg | Freq: Once | INTRAMUSCULAR | Status: AC
  Administered 2017-01-14: 130 mg via SUBCUTANEOUS
  Filled 2017-01-14: qty 5.2

## 2017-01-14 MED ORDER — ONDANSETRON HCL 4 MG PO TABS
8.0000 mg | ORAL_TABLET | Freq: Once | ORAL | Status: AC
  Administered 2017-01-14: 8 mg via ORAL
  Filled 2017-01-14: qty 2

## 2017-01-15 ENCOUNTER — Inpatient Hospital Stay: Payer: Medicare Other

## 2017-01-15 VITALS — BP 119/73 | HR 72 | Temp 97.0°F

## 2017-01-15 DIAGNOSIS — Z79899 Other long term (current) drug therapy: Secondary | ICD-10-CM | POA: Diagnosis not present

## 2017-01-15 DIAGNOSIS — D46Z Other myelodysplastic syndromes: Secondary | ICD-10-CM

## 2017-01-15 DIAGNOSIS — D4622 Refractory anemia with excess of blasts 2: Secondary | ICD-10-CM | POA: Diagnosis not present

## 2017-01-15 MED ORDER — AZACITIDINE CHEMO SQ INJECTION
130.0000 mg | Freq: Once | INTRAMUSCULAR | Status: AC
  Administered 2017-01-15: 130 mg via SUBCUTANEOUS
  Filled 2017-01-15: qty 5.2

## 2017-01-15 MED ORDER — ONDANSETRON HCL 4 MG PO TABS
8.0000 mg | ORAL_TABLET | Freq: Once | ORAL | Status: AC
  Administered 2017-01-15: 8 mg via ORAL
  Filled 2017-01-15: qty 2

## 2017-01-16 ENCOUNTER — Inpatient Hospital Stay: Payer: Medicare Other

## 2017-01-16 VITALS — BP 143/76 | HR 87 | Temp 99.0°F | Resp 20

## 2017-01-16 DIAGNOSIS — Z79899 Other long term (current) drug therapy: Secondary | ICD-10-CM | POA: Diagnosis not present

## 2017-01-16 DIAGNOSIS — D4622 Refractory anemia with excess of blasts 2: Secondary | ICD-10-CM | POA: Diagnosis not present

## 2017-01-16 DIAGNOSIS — D46Z Other myelodysplastic syndromes: Secondary | ICD-10-CM

## 2017-01-16 MED ORDER — AZACITIDINE CHEMO SQ INJECTION
130.0000 mg | Freq: Once | INTRAMUSCULAR | Status: AC
  Administered 2017-01-16: 130 mg via SUBCUTANEOUS
  Filled 2017-01-16: qty 5.2

## 2017-01-16 MED ORDER — ONDANSETRON HCL 4 MG PO TABS
8.0000 mg | ORAL_TABLET | Freq: Once | ORAL | Status: AC
  Administered 2017-01-16: 8 mg via ORAL
  Filled 2017-01-16: qty 2

## 2017-01-17 ENCOUNTER — Inpatient Hospital Stay: Payer: Medicare Other

## 2017-01-17 DIAGNOSIS — Z79899 Other long term (current) drug therapy: Secondary | ICD-10-CM | POA: Diagnosis not present

## 2017-01-17 DIAGNOSIS — D4622 Refractory anemia with excess of blasts 2: Secondary | ICD-10-CM | POA: Diagnosis not present

## 2017-01-17 DIAGNOSIS — D46Z Other myelodysplastic syndromes: Secondary | ICD-10-CM

## 2017-01-17 MED ORDER — AZACITIDINE CHEMO SQ INJECTION: 60.0000 mg/m2 | Freq: Once | INTRAMUSCULAR | Status: DC

## 2017-01-17 MED ORDER — AZACITIDINE CHEMO SQ INJECTION
130.0000 mg | Freq: Once | INTRAMUSCULAR | Status: AC
Start: 2017-01-17 — End: 2017-01-17
  Administered 2017-01-17: 130 mg via SUBCUTANEOUS
  Filled 2017-01-17: qty 5.2

## 2017-01-17 MED ORDER — ONDANSETRON HCL 4 MG PO TABS
8.0000 mg | ORAL_TABLET | Freq: Once | ORAL | Status: AC
  Administered 2017-01-17: 8 mg via ORAL
  Filled 2017-01-17: qty 2

## 2017-01-20 ENCOUNTER — Inpatient Hospital Stay: Payer: Medicare Other

## 2017-01-20 ENCOUNTER — Other Ambulatory Visit: Payer: Self-pay | Admitting: Internal Medicine

## 2017-01-20 DIAGNOSIS — Z7984 Long term (current) use of oral hypoglycemic drugs: Secondary | ICD-10-CM | POA: Diagnosis not present

## 2017-01-20 DIAGNOSIS — D46Z Other myelodysplastic syndromes: Secondary | ICD-10-CM

## 2017-01-20 DIAGNOSIS — R530 Neoplastic (malignant) related fatigue: Secondary | ICD-10-CM | POA: Diagnosis not present

## 2017-01-20 DIAGNOSIS — Z9221 Personal history of antineoplastic chemotherapy: Secondary | ICD-10-CM | POA: Diagnosis not present

## 2017-01-20 DIAGNOSIS — Z79899 Other long term (current) drug therapy: Secondary | ICD-10-CM | POA: Diagnosis not present

## 2017-01-20 DIAGNOSIS — D469 Myelodysplastic syndrome, unspecified: Secondary | ICD-10-CM | POA: Diagnosis not present

## 2017-01-20 DIAGNOSIS — D696 Thrombocytopenia, unspecified: Secondary | ICD-10-CM | POA: Diagnosis not present

## 2017-01-23 ENCOUNTER — Other Ambulatory Visit: Payer: Self-pay | Admitting: Family Medicine

## 2017-01-23 DIAGNOSIS — E785 Hyperlipidemia, unspecified: Secondary | ICD-10-CM | POA: Diagnosis not present

## 2017-01-23 DIAGNOSIS — E1121 Type 2 diabetes mellitus with diabetic nephropathy: Secondary | ICD-10-CM | POA: Diagnosis not present

## 2017-01-23 LAB — COMPLETE METABOLIC PANEL WITH GFR
ALBUMIN: 4.1 g/dL (ref 3.6–5.1)
ALK PHOS: 77 U/L (ref 33–130)
ALT: 16 U/L (ref 6–29)
AST: 17 U/L (ref 10–35)
BILIRUBIN TOTAL: 0.4 mg/dL (ref 0.2–1.2)
BUN: 19 mg/dL (ref 7–25)
CALCIUM: 9.1 mg/dL (ref 8.6–10.4)
CO2: 27 mmol/L (ref 20–31)
Chloride: 110 mmol/L (ref 98–110)
Creat: 1.2 mg/dL — ABNORMAL HIGH (ref 0.60–0.93)
GFR, EST NON AFRICAN AMERICAN: 44 mL/min — AB (ref >=60)
GFR, Est African American: 51 mL/min — ABNORMAL LOW (ref >=60)
Glucose, Bld: 130 mg/dL — ABNORMAL HIGH (ref 65–99)
POTASSIUM: 4.6 mmol/L (ref 3.5–5.3)
SODIUM: 145 mmol/L (ref 135–146)
TOTAL PROTEIN: 6.9 g/dL (ref 6.1–8.1)

## 2017-01-23 LAB — LIPID PANEL
CHOLESTEROL: 123 mg/dL (ref <200)
HDL: 26 mg/dL — ABNORMAL LOW (ref >50)
LDL Cholesterol: 61 mg/dL (ref <100)
Total CHOL/HDL Ratio: 4.7 Ratio (ref <5.0)
Triglycerides: 178 mg/dL — ABNORMAL HIGH (ref <150)
VLDL: 36 mg/dL — ABNORMAL HIGH (ref <30)

## 2017-01-23 LAB — MICROALBUMIN / CREATININE URINE RATIO
Creatinine, Urine: 48 mg/dL (ref 20–320)
MICROALB UR: 5 mg/dL
Microalb Creat Ratio: 104 mcg/mg creat — ABNORMAL HIGH (ref <30)

## 2017-01-24 ENCOUNTER — Other Ambulatory Visit: Payer: Self-pay | Admitting: Family Medicine

## 2017-01-24 DIAGNOSIS — E1121 Type 2 diabetes mellitus with diabetic nephropathy: Secondary | ICD-10-CM

## 2017-01-24 MED ORDER — LISINOPRIL 2.5 MG PO TABS: 2.5000 mg | ORAL_TABLET | Freq: Every day | ORAL | 0 refills | Status: DC

## 2017-01-27 ENCOUNTER — Inpatient Hospital Stay: Payer: Medicare Other

## 2017-01-27 DIAGNOSIS — D46Z Other myelodysplastic syndromes: Secondary | ICD-10-CM

## 2017-01-27 DIAGNOSIS — Z79899 Other long term (current) drug therapy: Secondary | ICD-10-CM | POA: Diagnosis not present

## 2017-01-27 DIAGNOSIS — D4622 Refractory anemia with excess of blasts 2: Secondary | ICD-10-CM | POA: Diagnosis not present

## 2017-01-27 LAB — COMPREHENSIVE METABOLIC PANEL
ALK PHOS: 81 U/L (ref 38–126)
ALT: 20 U/L (ref 14–54)
ANION GAP: 7 (ref 5–15)
AST: 21 U/L (ref 15–41)
Albumin: 4 g/dL (ref 3.5–5.0)
BUN: 27 mg/dL — ABNORMAL HIGH (ref 6–20)
CALCIUM: 9.4 mg/dL (ref 8.9–10.3)
CHLORIDE: 108 mmol/L (ref 101–111)
CO2: 26 mmol/L (ref 22–32)
Creatinine, Ser: 1.38 mg/dL — ABNORMAL HIGH (ref 0.44–1.00)
GFR calc non Af Amer: 36 mL/min — ABNORMAL LOW (ref >60)
GFR, EST AFRICAN AMERICAN: 42 mL/min — AB (ref >60)
Glucose, Bld: 132 mg/dL — ABNORMAL HIGH (ref 65–99)
Potassium: 4.7 mmol/L (ref 3.5–5.1)
SODIUM: 141 mmol/L (ref 135–145)
Total Bilirubin: 0.6 mg/dL (ref 0.3–1.2)
Total Protein: 7.7 g/dL (ref 6.5–8.1)

## 2017-01-27 LAB — CBC WITH DIFFERENTIAL/PLATELET
BASOS ABS: 0 10*3/uL (ref 0–0.1)
BASOS PCT: 1 %
EOS PCT: 0 %
Eosinophils Absolute: 0 10*3/uL (ref 0–0.7)
HCT: 24.5 % — ABNORMAL LOW (ref 35.0–47.0)
Hemoglobin: 8.2 g/dL — ABNORMAL LOW (ref 12.0–16.0)
Lymphocytes Relative: 38 %
Lymphs Abs: 1.2 10*3/uL (ref 1.0–3.6)
MCH: 32.4 pg (ref 26.0–34.0)
MCHC: 33.4 g/dL (ref 32.0–36.0)
MCV: 97.1 fL (ref 80.0–100.0)
Monocytes Absolute: 0.1 10*3/uL — ABNORMAL LOW (ref 0.2–0.9)
Monocytes Relative: 4 %
Neutro Abs: 1.8 10*3/uL (ref 1.4–6.5)
Neutrophils Relative %: 57 %
PLATELETS: 53 10*3/uL — AB (ref 150–440)
RBC: 2.52 MIL/uL — ABNORMAL LOW (ref 3.80–5.20)
RDW: 20.4 % — AB (ref 11.5–14.5)
WBC: 3.2 10*3/uL — ABNORMAL LOW (ref 3.6–11.0)

## 2017-01-27 LAB — SAMPLE TO BLOOD BANK

## 2017-01-28 ENCOUNTER — Telehealth: Payer: Self-pay | Admitting: Family Medicine

## 2017-01-28 NOTE — Telephone Encounter (Signed)
Return call and explained that we have started her on lisinopril 2.5 mg daily for nephropathy as evidenced by elevated microalbumin/creatinine ratio, this should help with microalbuminuria from long-standing diabetes. Patient verbalized agreement

## 2017-01-28 NOTE — Telephone Encounter (Signed)
Pt states Dr Manuella Ghazi called her in some new medication and she needs to know what its for. Please call her back.

## 2017-02-04 ENCOUNTER — Telehealth: Payer: Self-pay | Admitting: *Deleted

## 2017-02-04 ENCOUNTER — Inpatient Hospital Stay: Payer: Medicare Other

## 2017-02-04 ENCOUNTER — Other Ambulatory Visit: Payer: Self-pay | Admitting: *Deleted

## 2017-02-04 DIAGNOSIS — Z79899 Other long term (current) drug therapy: Secondary | ICD-10-CM | POA: Diagnosis not present

## 2017-02-04 DIAGNOSIS — D696 Thrombocytopenia, unspecified: Secondary | ICD-10-CM

## 2017-02-04 DIAGNOSIS — D46Z Other myelodysplastic syndromes: Secondary | ICD-10-CM

## 2017-02-04 DIAGNOSIS — D469 Myelodysplastic syndrome, unspecified: Secondary | ICD-10-CM

## 2017-02-04 DIAGNOSIS — D4622 Refractory anemia with excess of blasts 2: Secondary | ICD-10-CM | POA: Diagnosis not present

## 2017-02-04 LAB — COMPREHENSIVE METABOLIC PANEL
ALT: 22 U/L (ref 14–54)
ANION GAP: 5 (ref 5–15)
AST: 21 U/L (ref 15–41)
Albumin: 3.9 g/dL (ref 3.5–5.0)
Alkaline Phosphatase: 84 U/L (ref 38–126)
BILIRUBIN TOTAL: 0.5 mg/dL (ref 0.3–1.2)
BUN: 24 mg/dL — ABNORMAL HIGH (ref 6–20)
CO2: 27 mmol/L (ref 22–32)
Calcium: 9.1 mg/dL (ref 8.9–10.3)
Chloride: 106 mmol/L (ref 101–111)
Creatinine, Ser: 1.15 mg/dL — ABNORMAL HIGH (ref 0.44–1.00)
GFR, EST AFRICAN AMERICAN: 53 mL/min — AB (ref >60)
GFR, EST NON AFRICAN AMERICAN: 45 mL/min — AB (ref >60)
Glucose, Bld: 115 mg/dL — ABNORMAL HIGH (ref 65–99)
POTASSIUM: 4.4 mmol/L (ref 3.5–5.1)
Sodium: 138 mmol/L (ref 135–145)
TOTAL PROTEIN: 7.6 g/dL (ref 6.5–8.1)

## 2017-02-04 LAB — SAMPLE TO BLOOD BANK

## 2017-02-04 LAB — CBC WITH DIFFERENTIAL/PLATELET
BASOS PCT: 1 %
Basophils Absolute: 0 10*3/uL (ref 0–0.1)
EOS ABS: 0 10*3/uL (ref 0–0.7)
EOS PCT: 0 %
HCT: 23.5 % — ABNORMAL LOW (ref 35.0–47.0)
Hemoglobin: 8.1 g/dL — ABNORMAL LOW (ref 12.0–16.0)
Lymphocytes Relative: 37 %
Lymphs Abs: 1.2 10*3/uL (ref 1.0–3.6)
MCH: 33 pg (ref 26.0–34.0)
MCHC: 34.7 g/dL (ref 32.0–36.0)
MCV: 95.3 fL (ref 80.0–100.0)
MONOS PCT: 3 %
Monocytes Absolute: 0.1 10*3/uL — ABNORMAL LOW (ref 0.2–0.9)
Neutro Abs: 2 10*3/uL (ref 1.4–6.5)
Neutrophils Relative %: 59 %
PLATELETS: 12 10*3/uL — AB (ref 150–440)
RBC: 2.47 MIL/uL — ABNORMAL LOW (ref 3.80–5.20)
RDW: 21 % — ABNORMAL HIGH (ref 11.5–14.5)
WBC: 3.4 10*3/uL — AB (ref 3.6–11.0)

## 2017-02-04 NOTE — Telephone Encounter (Signed)
1445 - Critical plt  Count called by Phylliss Bob, RN in cancer center to Dr. Rogue Bussing. plt count 12.  Per Doni, RN, patient c/o bleeding mouth sores x several days.  Dr. Rogue Bussing made aware 1450. V/o obtained to transfuse 1 unit of plts tomorrow 02/05/17. Due to high acuity in cancer clinic, RN arranged for patient for 1 unit of plts to be transfused in [facility] unit. RN spoke with Jenny Reichmann in SDS-orders for plt infusion entered.  Pt contacted and informed to arrive in [place] dept by 1030 am tomorrow. Teach back process performed.

## 2017-02-05 ENCOUNTER — Ambulatory Visit
Admission: RE | Admit: 2017-02-05 | Discharge: 2017-02-05 | Disposition: A | Discharge status: Home / Self Care | Payer: Medicare Other | Source: Ambulatory Visit | Attending: Internal Medicine | Admitting: Internal Medicine

## 2017-02-05 DIAGNOSIS — D469 Myelodysplastic syndrome, unspecified: Secondary | ICD-10-CM

## 2017-02-05 DIAGNOSIS — D696 Thrombocytopenia, unspecified: Secondary | ICD-10-CM | POA: Diagnosis not present

## 2017-02-05 MED ORDER — SODIUM CHLORIDE 0.9 % IV SOLN
250.0000 mL | Freq: Once | INTRAVENOUS | Status: AC
  Administered 2017-02-05: 50 mL via INTRAVENOUS

## 2017-02-05 MED ORDER — DIPHENHYDRAMINE HCL 25 MG PO CAPS
25.0000 mg | ORAL_CAPSULE | Freq: Once | ORAL | Status: AC
  Administered 2017-02-05: 25 mg via ORAL

## 2017-02-05 MED ORDER — HEPARIN SOD (PORK) LOCK FLUSH 100 UNIT/ML IV SOLN
500.0000 [IU] | Freq: Every day | INTRAVENOUS | Status: DC | PRN
  Filled 2017-02-05: qty 5

## 2017-02-05 MED ORDER — ACETAMINOPHEN 325 MG PO TABS
650.0000 mg | ORAL_TABLET | Freq: Once | ORAL | Status: AC
  Administered 2017-02-05: 650 mg via ORAL

## 2017-02-05 MED ORDER — SODIUM CHLORIDE 0.9% FLUSH
3.0000 mL | INTRAVENOUS | Status: DC | PRN
Start: 2017-02-05 — End: 2017-02-06

## 2017-02-05 MED ORDER — DIPHENHYDRAMINE HCL 25 MG PO CAPS
ORAL_CAPSULE | ORAL | Status: AC
  Filled 2017-02-05: qty 1

## 2017-02-05 MED ORDER — HEPARIN SOD (PORK) LOCK FLUSH 100 UNIT/ML IV SOLN: 250.0000 [IU] | INTRAVENOUS | Status: DC | PRN

## 2017-02-05 MED ORDER — SODIUM CHLORIDE 0.9% FLUSH: 10.0000 mL | INTRAVENOUS | Status: DC | PRN

## 2017-02-05 MED ORDER — ACETAMINOPHEN 325 MG PO TABS
ORAL_TABLET | ORAL | Status: AC
  Filled 2017-02-05: qty 2

## 2017-02-05 MED ORDER — SODIUM CHLORIDE FLUSH 0.9 % IV SOLN
INTRAVENOUS | Status: AC
  Filled 2017-02-05: qty 10

## 2017-02-06 LAB — PREPARE PLATELET PHERESIS: Unit division: 0

## 2017-02-06 LAB — BPAM PLATELET PHERESIS
BLOOD PRODUCT EXPIRATION DATE: 201805312359
ISSUE DATE / TIME: 201805301052
Unit Type and Rh: 7300

## 2017-02-08 ENCOUNTER — Other Ambulatory Visit: Payer: Self-pay | Admitting: Hematology and Oncology

## 2017-02-08 NOTE — Progress Notes (Signed)
[place] NOTE  Patient Care Team: Roselee Nova, [name]  CHIEF COMPLAINTS/PURPOSE OF CONSULTATION:   Oncology History   #JUNE 2017- Severe neutropenia/ Anemia ~hb 9/platlets- 85-100 AUG 2017- REFRACTORY ANEMIA with EXCESS BLASTS [14% blasts- BMBx]; cytogenetics/FISH-N [SNP micorarray- not done]   # AUG 21st-  START AZA 80m/m2 day- 1-7 q 28 days x4 cycles; DEC 6th- BMBx- <5% blasts; hypercellular with dysplasia.   # CKD stage III     MDS (myelodysplastic syndrome), high grade ([place]   04/23/2016 Initial Diagnosis    MDS (myelodysplastic syndrome), high grade (HCC)         HISTORY OF PRESENTING ILLNESS:  Teresa Cowper89y.o.  female with above history of  MDS/high-grade currently started on Vidaza Status post cycle #8 [reduced by 20% sec to cytopenias with cycle #4] approximately 2 weeks ago is here for follow-up/To review the results of the bone marrow biopsy.  Symptomatically she is doing well. She denies any fever sweats or weight loss. She denies any bruising or bleeding. She has had no infections. CBC on 02/04/2017 had revealed a hematocrit 23.5, hemoglobin 8.1, platelets 12,000, white count 3400 with an ANC of 2000. She received a platelet transfusion.   ROS: A complete 10 point review of system is done which is negative except mentioned above in history of present illness.  MEDICAL HISTORY:  Past Medical History:  Diagnosis Date  . Abdominal wall mass   . Anginal pain ([place]   . Anxiety   . Arthritis   . Calculus of kidney   . Cystitis   . Depression   . Diabetes mellitus without complication (HCC)    elevated A1c  . Dyspnea on exertion   . Elevated serum creatinine   . Fibrocystic breast disease   . GERD (gastroesophageal reflux disease)   . Hearing loss   . Heart murmur   . HTN (hypertension)   . Hyperlipidemia   . MDS (myelodysplastic syndrome), high grade ([place] 04/23/2016  . Microscopic hematuria    . Mucositis due to chemotherapy   . Obesity   . Risk for falls   . Sleep apnea   . Thrombocytopenia (HT[place]   . Urinary frequency   . Urinary urgency     SURGICAL HISTORY: Past Surgical History:  Procedure Laterality Date  . ABDOMINAL HYSTERECTOMY    . APPENDECTOMY    . CARDIAC CATHETERIZATION     x2  . CHOLECYSTECTOMY    . COLONOSCOPY N/A 02/24/2015   Procedure: COLONOSCOPY;  Surgeon: RManya Silvas MD;  [facility];  Service: Endoscopy;  Laterality: N/A;  . DIAGNOSTIC LAPAROSCOPY     Removal of benign abdominal tumor  . ESOPHAGOGASTRODUODENOSCOPY N/A 02/24/2015   Procedure: ESOPHAGOGASTRODUODENOSCOPY (EGD);  Surgeon: RManya Silvas MD;  [facility];  Service: Endoscopy;  Laterality: N/A;  . right eye surgery Right     SOCIAL HISTORY: lives with family; snowcamp[place] No smoking/ no alcohol.  Social History   Social History  . Marital status: Married    Spouse name: N/A  . Number of children: N/A  . Years of education: N/A   Occupational History  . Not on file.   Social History Main Topics  . Smoking status: Former Smoker    Quit date: 09/09/1988  . Smokeless tobacco: Never Used     Comment: quit 25 years ago  . Alcohol use No  . Drug use: No  .  Sexual activity: Not on file   Other Topics Concern  . Not on file   Social History Narrative  . No narrative on file    FAMILY HISTORY: no cancers in family.  Family History  Problem Relation Age of Onset  . Congestive Heart Failure Mother   . Diabetes Mother   . Coronary artery disease Mother   . Stroke Mother   . Cirrhosis Father     ALLERGIES:  is allergic to macrobid [nitrofurantoin monohyd macro].  MEDICATIONS:  Current Outpatient Prescriptions  Medication Sig Dispense Refill  . acyclovir (ZOVIRAX) 400 MG tablet TAKE 1 TABLET(400 MG) BY MOUTH TWICE DAILY 120 tablet 0  . buPROPion (WELLBUTRIN XL) 150 MG 24 hr tablet Take 300 mg by mouth daily at 12  noon.     . clonazePAM (KLONOPIN) 0.5 MG tablet Take 0.5 mg by mouth 2 (two) times daily.     Marland Kitchen lisinopril (PRINIVIL,ZESTRIL) 2.5 MG tablet Take 1 tablet (2.5 mg total) by mouth daily. 90 tablet 0  . LUMIGAN 0.01 % SOLN Place 1 drop into both eyes at bedtime.     . magic mouthwash w/lidocaine SOLN Take 5 mLs by mouth 4 (four) times daily. 80 ml viscous lidocaine 2%, 80 ml Mylanta, 80 ml Diphenhydramine 12.5 mg/5 ml Elixir, 80 ml Nystatin 100,000 Unit suspension, 80 ml Prednisolone 15 mg/74m, 80 ml Distilled Water.  Sig: Swish/Swallow 5-10 ml four times a day as needed. Dispense 480 ml. 3RFs 480 mL 3  . metFORMIN (GLUCOPHAGE) 500 MG tablet TAKE 1 TABLET BY MOUTH DAILY 90 tablet 0  . Multiple Vitamin (MULTIVITAMIN WITH MINERALS) TABS tablet Take 1 tablet by mouth daily.    . Omega-3 Fatty Acids (FISH OIL) 1000 MG CAPS Take 1 capsule by mouth daily.    . rosuvastatin (CRESTOR) 20 MG tablet Take 1 tablet (20 mg total) by mouth at bedtime. 90 tablet 0  . sertraline (ZOLOFT) 100 MG tablet Take 100 mg by mouth daily at 12 noon.     .Marland KitchenUmeclidinium-Vilanterol (ANORO ELLIPTA) 62.5-25 MCG/INH AEPB Inhale 1 puff into the lungs daily as needed (shortness of breath).     . hydrocortisone (ANUSOL-HC) 2.5 % rectal cream Place 1 application rectally 2 (two) times daily as needed for hemorrhoids or itching. (Patient not taking: Reported on 02/05/2017) 30 g 0  . ondansetron (ZOFRAN-ODT) 4 MG disintegrating tablet Take 4 mg by mouth every 8 (eight) hours as needed for nausea or vomiting.    . polyethylene glycol (MIRALAX / GLYCOLAX) packet Take 17 g by mouth daily as needed for mild constipation. (Patient not taking: Reported on 02/05/2017) 14 each 1  . prochlorperazine (COMPAZINE) 10 MG tablet Take 1 tablet (10 mg total) by mouth every 6 (six) hours as needed (Nausea or vomiting). (Patient not taking: Reported on 02/05/2017) 30 tablet 1   No current facility-administered medications for this visit.       .Marland Kitchen PHYSICAL  EXAMINATION: ECOG PERFORMANCE STATUS: 1 - Symptomatic but completely ambulatory  Vitals:   02/10/17 1359  BP: (!) 149/76  Pulse: 74  Resp: 18  Temp: 98.5 F (36.9 C)   Filed Weights   02/10/17 1359  Weight: 236 lb 2 oz (107.1 kg)    GENERAL:  Well developed, well nourished, woman sitting comfortably in the exam room in no acute distress.  She is accompanied by her sister. MENTAL STATUS:  Alert and oriented to person, place and time. HEAD:  Normocephalic, atraumatic, face symmetric, no Cushingoid features. EYES:  Pupils equal round and reactive to light and accomodation.  No conjunctivitis or scleral icterus. ENT:  Oropharynx clear without lesion.  Tongue normal. Mucous membranes moist.  RESPIRATORY:  Clear to auscultation without rales, wheezes or rhonchi. CARDIOVASCULAR:  Regular rate and rhythm without murmur, rub or gallop. ABDOMEN:  Soft, non-tender, with active bowel sounds, and no appreciable hepatosplenomegaly.  No masses. SKIN:  No rashes, ulcers or lesions. EXTREMITIES: No edema, no skin discoloration or tenderness.  No palpable cords. LYMPH NODES: No palpable cervical, supraclavicular, axillary or inguinal adenopathy  NEUROLOGICAL: Unremarkable. PSYCH:  Appropriate.    LABORATORY DATA:  I have reviewed the data as listed Lab Results  Component Value Date   WBC 3.2 (L) 02/10/2017   HGB 8.3 (L) 02/10/2017   HCT 24.4 (L) 02/10/2017   MCV 97.6 02/10/2017   PLT 75 (L) 02/10/2017    Recent Labs  05/07/16 1652  01/27/17 1128 02/04/17 1141 02/10/17 1300  NA  --   < > 141 138 139  K  --   < > 4.7 4.4 4.5  CL  --   < > 108 106 107  CO2  --   < > _0 GLUCOSE  --   < > 132* 115* 111*  BUN  --   < > 27* 24* 25*  CREATININE  --   < > 1.38* 1.15* 1.23*  CALCIUM  --   < > 9.4 9.1 9.4  GFRNONAA  --   < > 36* 45* 42*  GFRAA  --   < > 42* 53* 48*  PROT 8.3*  < > 7.7 7.6 7.8  ALBUMIN 3.8  < > 4.0 3.9 4.2  AST 26  < > _1 ALT 25  < > _2 ALKPHOS 65   < > 81 84 81  BILITOT 0.6  < > 0.6 0.5 0.6  BILIDIR 0.1  --   --   --   --   IBILI 0.5  --   --   --   --   < > = values in this interval not displayed.  RADIOGRAPHIC STUDIES: I have personally reviewed the radiological images as listed and agreed with the findings in the report. No results found.  ASSESSMENT & PLAN:   MDS (myelodysplastic syndrome), high grade (HCC) Refractory anemia- with excess blasts- II [blasts percentage 14%] FISH- normal karyotype. On vidaza; s/p cycle # 8; on April 23rd 2018- BMX- Partial response noted [persistent high grade MDS; aspirate -sub-optimal for blasts evaluation; 90% hyper-cellular bone marrow; dysplastic changes]. As per my discussion with pathologist- overall bone marrow "improved " from previous marrows. Continue Vidaza.   She is s/p 9 cycles of azacitidine. She has had a hematologic improvement, most notably early in her course of azacitidine. Recent thrombocytopenia and to a lesser extent anemia may be related to hematologic toxicity from a azacitidine.  We discussed postponing treatment today until her platelet count near at baseline.  MDS: Continue azacitidine until loss of hematologic improvement. She has seen Dr. Clint Bolder for clinical trial eligibility. Dr Rogue Bussing had previously discussed ([id number]-8800 spiceosome modulator   # HOLD treatment #9 today; platelets- 75,000; Labs today reviewed; will plan starting next week. Continue duration to 5 days instead of 7 days- sec to severe thrombocytopenia [platelets 9]  #  subcutaneous nodule of the right upper extremity- no obvious evidence of any malignancy [Leukemic cutis or chloroma]. Improved.   #CKD stage III-stable.  # Prophylactic  antibiotics- acyclovir/ diflucan.  If ANC < 500 between cycles, would add levofloxacin 500 mg per day and fluconazole 400 mg per day when neutropenic. Infulenza and PCV23 vaccine given 07/2016 per our program guidelines  # weekly cbc/hold tube/ follow up covering  MD June 4th/ treatment;  Appt at [facility] [Dr.Foster] on May 14th.; in appx 2 months with me/labs/vidaza.     Lequita Asal, MD 02/10/2017 2:21 PM

## 2017-02-10 ENCOUNTER — Inpatient Hospital Stay: Payer: Medicare Other

## 2017-02-10 ENCOUNTER — Inpatient Hospital Stay (HOSPITAL_BASED_OUTPATIENT_CLINIC_OR_DEPARTMENT_OTHER): Payer: Medicare Other | Admitting: Hematology and Oncology

## 2017-02-10 ENCOUNTER — Encounter: Payer: Self-pay | Admitting: Hematology and Oncology

## 2017-02-10 ENCOUNTER — Inpatient Hospital Stay: Payer: Medicare Other | Attending: Hematology and Oncology

## 2017-02-10 VITALS — BP 149/76 | HR 74 | Temp 98.5°F | Resp 18 | Wt 236.1 lb

## 2017-02-10 DIAGNOSIS — Z87442 Personal history of urinary calculi: Secondary | ICD-10-CM | POA: Diagnosis not present

## 2017-02-10 DIAGNOSIS — R5382 Chronic fatigue, unspecified: Secondary | ICD-10-CM | POA: Diagnosis not present

## 2017-02-10 DIAGNOSIS — N183 Chronic kidney disease, stage 3 (moderate): Secondary | ICD-10-CM | POA: Diagnosis not present

## 2017-02-10 DIAGNOSIS — Z5112 Encounter for antineoplastic immunotherapy: Secondary | ICD-10-CM | POA: Insufficient documentation

## 2017-02-10 DIAGNOSIS — F329 Major depressive disorder, single episode, unspecified: Secondary | ICD-10-CM

## 2017-02-10 DIAGNOSIS — I129 Hypertensive chronic kidney disease with stage 1 through stage 4 chronic kidney disease, or unspecified chronic kidney disease: Secondary | ICD-10-CM | POA: Insufficient documentation

## 2017-02-10 DIAGNOSIS — E785 Hyperlipidemia, unspecified: Secondary | ICD-10-CM | POA: Insufficient documentation

## 2017-02-10 DIAGNOSIS — D696 Thrombocytopenia, unspecified: Secondary | ICD-10-CM

## 2017-02-10 DIAGNOSIS — E1122 Type 2 diabetes mellitus with diabetic chronic kidney disease: Secondary | ICD-10-CM | POA: Insufficient documentation

## 2017-02-10 DIAGNOSIS — E669 Obesity, unspecified: Secondary | ICD-10-CM | POA: Insufficient documentation

## 2017-02-10 DIAGNOSIS — D4622 Refractory anemia with excess of blasts 2: Secondary | ICD-10-CM | POA: Diagnosis not present

## 2017-02-10 DIAGNOSIS — Z7984 Long term (current) use of oral hypoglycemic drugs: Secondary | ICD-10-CM | POA: Insufficient documentation

## 2017-02-10 DIAGNOSIS — R3129 Other microscopic hematuria: Secondary | ICD-10-CM

## 2017-02-10 DIAGNOSIS — D46Z Other myelodysplastic syndromes: Secondary | ICD-10-CM

## 2017-02-10 DIAGNOSIS — F418 Other specified anxiety disorders: Secondary | ICD-10-CM

## 2017-02-10 DIAGNOSIS — Z87891 Personal history of nicotine dependence: Secondary | ICD-10-CM

## 2017-02-10 DIAGNOSIS — Z79899 Other long term (current) drug therapy: Secondary | ICD-10-CM | POA: Diagnosis not present

## 2017-02-10 DIAGNOSIS — K219 Gastro-esophageal reflux disease without esophagitis: Secondary | ICD-10-CM | POA: Diagnosis not present

## 2017-02-10 DIAGNOSIS — G4730 Sleep apnea, unspecified: Secondary | ICD-10-CM | POA: Insufficient documentation

## 2017-02-10 LAB — COMPREHENSIVE METABOLIC PANEL
ALK PHOS: 81 U/L (ref 38–126)
ALT: 20 U/L (ref 14–54)
AST: 21 U/L (ref 15–41)
Albumin: 4.2 g/dL (ref 3.5–5.0)
Anion gap: 6 (ref 5–15)
BILIRUBIN TOTAL: 0.6 mg/dL (ref 0.3–1.2)
BUN: 25 mg/dL — ABNORMAL HIGH (ref 6–20)
CALCIUM: 9.4 mg/dL (ref 8.9–10.3)
CO2: 26 mmol/L (ref 22–32)
CREATININE: 1.23 mg/dL — AB (ref 0.44–1.00)
Chloride: 107 mmol/L (ref 101–111)
GFR, EST AFRICAN AMERICAN: 48 mL/min — AB (ref >60)
GFR, EST NON AFRICAN AMERICAN: 42 mL/min — AB (ref >60)
Glucose, Bld: 111 mg/dL — ABNORMAL HIGH (ref 65–99)
Potassium: 4.5 mmol/L (ref 3.5–5.1)
Sodium: 139 mmol/L (ref 135–145)
Total Protein: 7.8 g/dL (ref 6.5–8.1)

## 2017-02-10 LAB — CBC WITH DIFFERENTIAL/PLATELET
Basophils Absolute: 0 10*3/uL (ref 0–0.1)
Basophils Relative: 1 %
Eosinophils Absolute: 0 10*3/uL (ref 0–0.7)
Eosinophils Relative: 0 %
HEMATOCRIT: 24.4 % — AB (ref 35.0–47.0)
Hemoglobin: 8.3 g/dL — ABNORMAL LOW (ref 12.0–16.0)
LYMPHS PCT: 33 %
Lymphs Abs: 1.1 10*3/uL (ref 1.0–3.6)
MCH: 33.1 pg (ref 26.0–34.0)
MCHC: 33.9 g/dL (ref 32.0–36.0)
MCV: 97.6 fL (ref 80.0–100.0)
MONO ABS: 0.1 10*3/uL — AB (ref 0.2–0.9)
MONOS PCT: 4 %
NEUTROS ABS: 2 10*3/uL (ref 1.4–6.5)
Neutrophils Relative %: 62 %
Platelets: 75 10*3/uL — ABNORMAL LOW (ref 150–440)
RBC: 2.5 MIL/uL — ABNORMAL LOW (ref 3.80–5.20)
RDW: 21.5 % — AB (ref 11.5–14.5)
WBC: 3.2 10*3/uL — ABNORMAL LOW (ref 3.6–11.0)

## 2017-02-10 LAB — SAMPLE TO BLOOD BANK

## 2017-02-10 NOTE — Progress Notes (Signed)
Patient of Dr. Sharmaine Base. Here today regarding myelodysplastic syndrome.  Offers no complaints today.

## 2017-02-11 ENCOUNTER — Inpatient Hospital Stay: Payer: Medicare Other

## 2017-02-12 ENCOUNTER — Inpatient Hospital Stay: Payer: Medicare Other

## 2017-02-13 ENCOUNTER — Ambulatory Visit: Payer: Medicare Other

## 2017-02-14 ENCOUNTER — Inpatient Hospital Stay: Payer: Medicare Other

## 2017-02-17 ENCOUNTER — Inpatient Hospital Stay (HOSPITAL_BASED_OUTPATIENT_CLINIC_OR_DEPARTMENT_OTHER): Payer: Medicare Other | Admitting: Hematology and Oncology

## 2017-02-17 ENCOUNTER — Encounter: Payer: Self-pay | Admitting: Hematology and Oncology

## 2017-02-17 ENCOUNTER — Other Ambulatory Visit: Payer: Medicare Other

## 2017-02-17 ENCOUNTER — Other Ambulatory Visit: Payer: Self-pay | Admitting: Hematology and Oncology

## 2017-02-17 ENCOUNTER — Inpatient Hospital Stay: Payer: Medicare Other

## 2017-02-17 VITALS — BP 138/72 | HR 66 | Temp 97.8°F | Resp 18 | Wt 237.2 lb

## 2017-02-17 DIAGNOSIS — I129 Hypertensive chronic kidney disease with stage 1 through stage 4 chronic kidney disease, or unspecified chronic kidney disease: Secondary | ICD-10-CM | POA: Diagnosis not present

## 2017-02-17 DIAGNOSIS — E669 Obesity, unspecified: Secondary | ICD-10-CM

## 2017-02-17 DIAGNOSIS — E785 Hyperlipidemia, unspecified: Secondary | ICD-10-CM | POA: Diagnosis not present

## 2017-02-17 DIAGNOSIS — Z5111 Encounter for antineoplastic chemotherapy: Secondary | ICD-10-CM | POA: Insufficient documentation

## 2017-02-17 DIAGNOSIS — R5382 Chronic fatigue, unspecified: Secondary | ICD-10-CM

## 2017-02-17 DIAGNOSIS — R3129 Other microscopic hematuria: Secondary | ICD-10-CM

## 2017-02-17 DIAGNOSIS — N183 Chronic kidney disease, stage 3 (moderate): Secondary | ICD-10-CM | POA: Diagnosis not present

## 2017-02-17 DIAGNOSIS — F418 Other specified anxiety disorders: Secondary | ICD-10-CM

## 2017-02-17 DIAGNOSIS — D46Z Other myelodysplastic syndromes: Secondary | ICD-10-CM

## 2017-02-17 DIAGNOSIS — Z87442 Personal history of urinary calculi: Secondary | ICD-10-CM

## 2017-02-17 DIAGNOSIS — E1122 Type 2 diabetes mellitus with diabetic chronic kidney disease: Secondary | ICD-10-CM | POA: Diagnosis not present

## 2017-02-17 DIAGNOSIS — K219 Gastro-esophageal reflux disease without esophagitis: Secondary | ICD-10-CM

## 2017-02-17 DIAGNOSIS — Z87891 Personal history of nicotine dependence: Secondary | ICD-10-CM

## 2017-02-17 DIAGNOSIS — G4730 Sleep apnea, unspecified: Secondary | ICD-10-CM

## 2017-02-17 DIAGNOSIS — D4622 Refractory anemia with excess of blasts 2: Secondary | ICD-10-CM | POA: Diagnosis not present

## 2017-02-17 DIAGNOSIS — Z5112 Encounter for antineoplastic immunotherapy: Secondary | ICD-10-CM | POA: Diagnosis not present

## 2017-02-17 DIAGNOSIS — F329 Major depressive disorder, single episode, unspecified: Secondary | ICD-10-CM | POA: Diagnosis not present

## 2017-02-17 DIAGNOSIS — Z7984 Long term (current) use of oral hypoglycemic drugs: Secondary | ICD-10-CM

## 2017-02-17 DIAGNOSIS — Z79899 Other long term (current) drug therapy: Secondary | ICD-10-CM | POA: Diagnosis not present

## 2017-02-17 DIAGNOSIS — D696 Thrombocytopenia, unspecified: Secondary | ICD-10-CM

## 2017-02-17 LAB — CBC WITH DIFFERENTIAL/PLATELET
Basophils Absolute: 0 10*3/uL (ref 0–0.1)
Basophils Relative: 1 %
Eosinophils Absolute: 0 10*3/uL (ref 0–0.7)
Eosinophils Relative: 0 %
HCT: 25.1 % — ABNORMAL LOW (ref 35.0–47.0)
Hemoglobin: 8.6 g/dL — ABNORMAL LOW (ref 12.0–16.0)
Lymphocytes Relative: 37 %
Lymphs Abs: 1.2 10*3/uL (ref 1.0–3.6)
MCH: 33.2 pg (ref 26.0–34.0)
MCHC: 34.1 g/dL (ref 32.0–36.0)
MCV: 97.5 fL (ref 80.0–100.0)
Monocytes Absolute: 0.1 10*3/uL — ABNORMAL LOW (ref 0.2–0.9)
Monocytes Relative: 4 %
Neutro Abs: 1.9 10*3/uL (ref 1.4–6.5)
Neutrophils Relative %: 58 %
Platelets: 103 10*3/uL — ABNORMAL LOW (ref 150–440)
RBC: 2.58 MIL/uL — ABNORMAL LOW (ref 3.80–5.20)
RDW: 21.1 % — ABNORMAL HIGH (ref 11.5–14.5)
WBC: 3.3 10*3/uL — ABNORMAL LOW (ref 3.6–11.0)

## 2017-02-17 LAB — COMPREHENSIVE METABOLIC PANEL
ALT: 41 U/L (ref 14–54)
AST: 38 U/L (ref 15–41)
Albumin: 3.9 g/dL (ref 3.5–5.0)
Alkaline Phosphatase: 77 U/L (ref 38–126)
Anion gap: 3 — ABNORMAL LOW (ref 5–15)
BUN: 24 mg/dL — ABNORMAL HIGH (ref 6–20)
CO2: 27 mmol/L (ref 22–32)
Calcium: 9.4 mg/dL (ref 8.9–10.3)
Chloride: 109 mmol/L (ref 101–111)
Creatinine, Ser: 1.28 mg/dL — ABNORMAL HIGH (ref 0.44–1.00)
GFR calc Af Amer: 46 mL/min — ABNORMAL LOW (ref >60)
GFR calc non Af Amer: 40 mL/min — ABNORMAL LOW (ref >60)
Glucose, Bld: 112 mg/dL — ABNORMAL HIGH (ref 65–99)
Potassium: 4.9 mmol/L (ref 3.5–5.1)
Sodium: 139 mmol/L (ref 135–145)
Total Bilirubin: 0.6 mg/dL (ref 0.3–1.2)
Total Protein: 7.3 g/dL (ref 6.5–8.1)

## 2017-02-17 LAB — SAMPLE TO BLOOD BANK

## 2017-02-17 MED ORDER — AZACITIDINE CHEMO SQ INJECTION
130.0000 mg | Freq: Once | INTRAMUSCULAR | Status: AC
  Administered 2017-02-17: 130 mg via SUBCUTANEOUS
  Filled 2017-02-17: qty 5.2

## 2017-02-17 MED ORDER — AZACITIDINE CHEMO SQ INJECTION: 60.0000 mg/m2 | Freq: Once | INTRAMUSCULAR | Status: DC

## 2017-02-17 MED ORDER — ONDANSETRON HCL 4 MG PO TABS
8.0000 mg | ORAL_TABLET | Freq: Once | ORAL | Status: AC
  Administered 2017-02-17: 8 mg via ORAL
  Filled 2017-02-17: qty 2

## 2017-02-17 NOTE — Progress Notes (Signed)
Patient here today for vidaza.  Platelets were low the last 2 weeks.

## 2017-02-17 NOTE — Progress Notes (Signed)
[place] NOTE  Patient Care Team: Roselee Nova, [name]  CHIEF COMPLAINTS/PURPOSE OF CONSULTATION:   Oncology History   #JUNE 2017- Severe neutropenia/ Anemia ~hb 9/platlets- 85-100 AUG 2017- REFRACTORY ANEMIA with EXCESS BLASTS [14% blasts- BMBx]; cytogenetics/FISH-N [SNP micorarray- not done]   # AUG 21st-  START AZA 56m/m2 day- 1-7 q 28 days x4 cycles; DEC 6th- BMBx- <5% blasts; hypercellular with dysplasia.   # CKD stage III     MDS (myelodysplastic syndrome), high grade ([place]   04/23/2016 Initial Diagnosis    MDS (myelodysplastic syndrome), high grade (HCC)         HISTORY OF PRESENTING ILLNESS:  [name]  female with above history of  MDS/high-grade currently started on Vidaza Status post cycle #8 [reduced by 20% sec to cytopenias with cycle #4] approximately 2 weeks ago is here for follow-up/To review the results of the bone marrow biopsy.  The patient was seen last week in the medical oncology clinic. At that time, Vidaza was postponed secondary to thrombocytopenia (platelet count of 75,000). She has done well during the interim.  She denies any bruising or bleeding. She denies any infections or fevers.  ROS: A complete 10 point review of system is done which is negative except mentioned above in history of present illness.  MEDICAL HISTORY:  Past Medical History:  Diagnosis Date  . Abdominal wall mass   . Anginal pain ([place]. [place]   . Anxiety   . Arthritis   . Calculus of kidney   . Cystitis   . Depression   . Diabetes mellitus without complication (HCC)    elevated A1c  . Dyspnea on exertion   . Elevated serum creatinine   . Fibrocystic breast disease   . GERD (gastroesophageal reflux disease)   . Hearing loss   . Heart murmur   . HTN (hypertension)   . Hyperlipidemia   . MDS (myelodysplastic syndrome), high grade ([place] 04/23/2016  . Microscopic hematuria   . Mucositis due to chemotherapy    . Obesity   . Risk for falls   . Sleep apnea   . Thrombocytopenia ([place]   . Urinary frequency   . Urinary urgency     SURGICAL HISTORY: Past Surgical History:  Procedure Laterality Date  . ABDOMINAL HYSTERECTOMY    . APPENDECTOMY    . CARDIAC CATHETERIZATION     x2  . CHOLECYSTECTOMY    . COLONOSCOPY N/A 02/24/2015   Procedure: COLONOSCOPY;  Surgeon: RManya Silvas MD;  [facility];  Service: Endoscopy;  Laterality: N/A;  . DIAGNOSTIC LAPAROSCOPY     Removal of benign abdominal tumor  . ESOPHAGOGASTRODUODENOSCOPY N/A 02/24/2015   Procedure: ESOPHAGOGASTRODUODENOSCOPY (EGD);  Surgeon: RManya Silvas MD;  [facility];  Service: Endoscopy;  Laterality: N/A;  . right eye surgery Right     SOCIAL HISTORY: lives with family; [place]camp[place] No smoking/ no alcohol.  Social History   Social History  . Marital status: Married    Spouse name: N/A  . Number of children: N/A  . Years of education: N/A   Occupational History  . Not on file.   Social History Main Topics  . Smoking status: Former Smoker    Quit date: 09/09/1988  . Smokeless tobacco: Never Used     Comment: quit 25 years ago  . Alcohol use No  . Drug use: No  . Sexual activity: Not on  file   Other Topics Concern  . Not on file   Social History Narrative  . No narrative on file    FAMILY HISTORY: no cancers in family.  Family History  Problem Relation Age of Onset  . Congestive Heart Failure Mother   . Diabetes Mother   . Coronary artery disease Mother   . Stroke Mother   . Cirrhosis Father     ALLERGIES:  is allergic to macrobid [nitrofurantoin monohyd macro].  MEDICATIONS:  Current Outpatient Prescriptions  Medication Sig Dispense Refill  . acyclovir (ZOVIRAX) 400 MG tablet TAKE 1 TABLET(400 MG) BY MOUTH TWICE DAILY 120 tablet 0  . buPROPion (WELLBUTRIN XL) 150 MG 24 hr tablet Take 300 mg by mouth daily at 12 noon.     . clonazePAM (KLONOPIN)  0.5 MG tablet Take 0.5 mg by mouth 2 (two) times daily.     Marland Kitchen lisinopril (PRINIVIL,ZESTRIL) 2.5 MG tablet Take 1 tablet (2.5 mg total) by mouth daily. 90 tablet 0  . LUMIGAN 0.01 % SOLN Place 1 drop into both eyes at bedtime.     . metFORMIN (GLUCOPHAGE) 500 MG tablet TAKE 1 TABLET BY MOUTH DAILY 90 tablet 0  . Multiple Vitamin (MULTIVITAMIN WITH MINERALS) TABS tablet Take 1 tablet by mouth daily.    . Omega-3 Fatty Acids (FISH OIL) 1000 MG CAPS Take 1 capsule by mouth daily.    . rosuvastatin (CRESTOR) 20 MG tablet Take 1 tablet (20 mg total) by mouth at bedtime. 90 tablet 0  . sertraline (ZOLOFT) 100 MG tablet Take 100 mg by mouth daily at 12 noon.     Marland Kitchen Umeclidinium-Vilanterol (ANORO ELLIPTA) 62.5-25 MCG/INH AEPB Inhale 1 puff into the lungs daily as needed (shortness of breath).     . hydrocortisone (ANUSOL-HC) 2.5 % rectal cream Place 1 application rectally 2 (two) times daily as needed for hemorrhoids or itching. (Patient not taking: Reported on 02/05/2017) 30 g 0  . magic mouthwash w/lidocaine SOLN Take 5 mLs by mouth 4 (four) times daily. 80 ml viscous lidocaine 2%, 80 ml Mylanta, 80 ml Diphenhydramine 12.5 mg/5 ml Elixir, 80 ml Nystatin 100,000 Unit suspension, 80 ml Prednisolone 15 mg/49m, 80 ml Distilled Water.  Sig: Swish/Swallow 5-10 ml four times a day as needed. Dispense 480 ml. 3RFs 480 mL 3  . ondansetron (ZOFRAN-ODT) 4 MG disintegrating tablet Take 4 mg by mouth every 8 (eight) hours as needed for nausea or vomiting.    . polyethylene glycol (MIRALAX / GLYCOLAX) packet Take 17 g by mouth daily as needed for mild constipation. (Patient not taking: Reported on 02/05/2017) 14 each 1  . prochlorperazine (COMPAZINE) 10 MG tablet Take 1 tablet (10 mg total) by mouth every 6 (six) hours as needed (Nausea or vomiting). (Patient not taking: Reported on 02/05/2017) 30 tablet 1   No current facility-administered medications for this visit.       .Marland Kitchen PHYSICAL EXAMINATION: ECOG PERFORMANCE  STATUS: 1 - Symptomatic but completely ambulatory  Vitals:   02/17/17 1500  BP: 138/72  Pulse: 66  Resp: 18  Temp: 97.8 F (36.6 C)   Filed Weights   02/17/17 1500  Weight: 237 lb 4 oz (107.6 kg)   GENERAL:  Well developed, well nourished, woman sitting comfortably in the exam room in no acute distress.  Her husband CShelly Flattenis with her. MENTAL STATUS:  Alert and oriented to person, place and time. HEAD:  Normocephalic, atraumatic, face symmetric, no Cushingoid features. EYES:  Pupils equal round and reactive  to light and accomodation.  No conjunctivitis or scleral icterus. ENT:  Oropharynx clear without lesion.  Tongue normal. Mucous membranes moist.  RESPIRATORY:  Clear to auscultation without rales, wheezes or rhonchi. CARDIOVASCULAR:  Regular rate and rhythm without murmur, rub or gallop. ABDOMEN:  Soft, non-tender, with active bowel sounds, and no hepatosplenomegaly.  No masses. SKIN:  No rashes, ulcers or lesions. EXTREMITIES: No edema, no skin discoloration or tenderness.  No palpable cords. LYMPH NODES: No palpable cervical, supraclavicular, axillary or inguinal adenopathy  NEUROLOGICAL: Unremarkable. PSYCH:  Appropriate.   LABORATORY DATA:  I have reviewed the data as listed Lab Results  Component Value Date   WBC 3.3 (L) 02/17/2017   HGB 8.6 (L) 02/17/2017   HCT 25.1 (L) 02/17/2017   MCV 97.5 02/17/2017   PLT 103 (L) 02/17/2017    Recent Labs  05/07/16 1652  02/04/17 1141 02/10/17 1300 02/17/17 1420  NA  --   < > 138 139 139  K  --   < > 4.4 4.5 4.9  CL  --   < > 106 107 109  CO2  --   < > _0 GLUCOSE  --   < > 115* 111* 112*  BUN  --   < > 24* 25* 24*  CREATININE  --   < > 1.15* 1.23* 1.28*  CALCIUM  --   < > 9.1 9.4 9.4  GFRNONAA  --   < > 45* 42* 40*  GFRAA  --   < > 53* 48* 46*  PROT 8.3*  < > 7.6 7.8 7.3  ALBUMIN 3.8  < > 3.9 4.2 3.9  AST 26  < > 21 21 38  ALT 25  < > 22 20 41  ALKPHOS 65  < > 84 81 77  BILITOT 0.6  < > 0.5 0.6 0.6   BILIDIR 0.1  --   --   --   --   IBILI 0.5  --   --   --   --   < > = values in this interval not displayed.  RADIOGRAPHIC STUDIES: I have personally reviewed the radiological images as listed and agreed with the findings in the report. No results found.  ASSESSMENT & PLAN:   MDS (myelodysplastic syndrome), high grade (HCC) Refractory anemia- with excess blasts- II [blasts percentage 14%] FISH- normal karyotype. On vidaza; s/p cycle # 8; on April 23rd 2018- BMX- Partial response noted [persistent high grade MDS; aspirate -sub-optimal for blasts evaluation; 90% hyper-cellular bone marrow; dysplastic changes]. As per my discussion with pathologist- overall bone marrow "improved " from previous marrows. Continue Vidaza.   Platelet count has recovered back to baseline.  Discuss the plan to begin cycle #10 Vidaza.  She will have counts checked weekly.  MDS: Continue azacitidine until loss of hematologic improvement. At that time I would consider her for cliniical trials that we had previously discussed ([id number]-8800 spiceosome modulator.  She has met with Dr. Clint Bolder.  Plan is to continue current treatment.   # Treatment duration is 5 days instead of 7 days- sec to severe thrombocytopenia [platelets 9]  #  subcutaneous nodule of the right upper extremity- no obvious evidence of any malignancy [Leukemic cutis or chloroma]. Improved.   #CKD stage III-stable.  # Prophylactic antibiotics- acyclovir/ diflucan.  If ANC < 500 between cycles, would add levofloxacin 500 mg per day and fluconazole 400 mg per day when neutropenic. Infulenza and PCV23 vaccine given 07/2016 per our program  guidelines  # weekly cbc/hold tube/ follow up covering MD June 4th/ treatment;  Appt at [facility] [Dr.Foster] on May 14th.; in appx 2 months with me/labs/vidaza.     Lequita Asal, MD 02/17/2017 3:24 PM

## 2017-02-18 ENCOUNTER — Other Ambulatory Visit: Payer: Self-pay | Admitting: *Deleted

## 2017-02-18 ENCOUNTER — Inpatient Hospital Stay: Payer: Medicare Other

## 2017-02-18 VITALS — BP 110/69 | HR 71 | Resp 20

## 2017-02-18 DIAGNOSIS — R3129 Other microscopic hematuria: Secondary | ICD-10-CM | POA: Diagnosis not present

## 2017-02-18 DIAGNOSIS — D46Z Other myelodysplastic syndromes: Secondary | ICD-10-CM

## 2017-02-18 DIAGNOSIS — D696 Thrombocytopenia, unspecified: Secondary | ICD-10-CM | POA: Diagnosis not present

## 2017-02-18 DIAGNOSIS — I129 Hypertensive chronic kidney disease with stage 1 through stage 4 chronic kidney disease, or unspecified chronic kidney disease: Secondary | ICD-10-CM | POA: Diagnosis not present

## 2017-02-18 DIAGNOSIS — E785 Hyperlipidemia, unspecified: Secondary | ICD-10-CM | POA: Diagnosis not present

## 2017-02-18 DIAGNOSIS — G4730 Sleep apnea, unspecified: Secondary | ICD-10-CM | POA: Diagnosis not present

## 2017-02-18 DIAGNOSIS — Z87442 Personal history of urinary calculi: Secondary | ICD-10-CM | POA: Diagnosis not present

## 2017-02-18 DIAGNOSIS — E1122 Type 2 diabetes mellitus with diabetic chronic kidney disease: Secondary | ICD-10-CM | POA: Diagnosis not present

## 2017-02-18 DIAGNOSIS — D4622 Refractory anemia with excess of blasts 2: Secondary | ICD-10-CM | POA: Diagnosis not present

## 2017-02-18 DIAGNOSIS — Z87891 Personal history of nicotine dependence: Secondary | ICD-10-CM | POA: Diagnosis not present

## 2017-02-18 DIAGNOSIS — Z5112 Encounter for antineoplastic immunotherapy: Secondary | ICD-10-CM | POA: Diagnosis not present

## 2017-02-18 DIAGNOSIS — Z79899 Other long term (current) drug therapy: Secondary | ICD-10-CM | POA: Diagnosis not present

## 2017-02-18 DIAGNOSIS — K219 Gastro-esophageal reflux disease without esophagitis: Secondary | ICD-10-CM | POA: Diagnosis not present

## 2017-02-18 DIAGNOSIS — Z7984 Long term (current) use of oral hypoglycemic drugs: Secondary | ICD-10-CM | POA: Diagnosis not present

## 2017-02-18 DIAGNOSIS — N183 Chronic kidney disease, stage 3 (moderate): Secondary | ICD-10-CM | POA: Diagnosis not present

## 2017-02-18 DIAGNOSIS — R5382 Chronic fatigue, unspecified: Secondary | ICD-10-CM | POA: Diagnosis not present

## 2017-02-18 MED ORDER — ONDANSETRON HCL 4 MG PO TABS
8.0000 mg | ORAL_TABLET | Freq: Once | ORAL | Status: AC
  Administered 2017-02-18: 8 mg via ORAL
  Filled 2017-02-18: qty 2

## 2017-02-18 MED ORDER — AZACITIDINE CHEMO SQ INJECTION
130.0000 mg | Freq: Once | INTRAMUSCULAR | Status: AC
  Administered 2017-02-18: 130 mg via SUBCUTANEOUS
  Filled 2017-02-18: qty 5.2

## 2017-02-19 ENCOUNTER — Other Ambulatory Visit: Payer: Self-pay | Admitting: Family Medicine

## 2017-02-19 ENCOUNTER — Inpatient Hospital Stay: Payer: Medicare Other

## 2017-02-19 DIAGNOSIS — Z87442 Personal history of urinary calculi: Secondary | ICD-10-CM | POA: Diagnosis not present

## 2017-02-19 DIAGNOSIS — D4622 Refractory anemia with excess of blasts 2: Secondary | ICD-10-CM | POA: Diagnosis not present

## 2017-02-19 DIAGNOSIS — G4730 Sleep apnea, unspecified: Secondary | ICD-10-CM | POA: Diagnosis not present

## 2017-02-19 DIAGNOSIS — Z7984 Long term (current) use of oral hypoglycemic drugs: Secondary | ICD-10-CM | POA: Diagnosis not present

## 2017-02-19 DIAGNOSIS — E785 Hyperlipidemia, unspecified: Secondary | ICD-10-CM | POA: Diagnosis not present

## 2017-02-19 DIAGNOSIS — Z79899 Other long term (current) drug therapy: Secondary | ICD-10-CM | POA: Diagnosis not present

## 2017-02-19 DIAGNOSIS — R5382 Chronic fatigue, unspecified: Secondary | ICD-10-CM | POA: Diagnosis not present

## 2017-02-19 DIAGNOSIS — Z87891 Personal history of nicotine dependence: Secondary | ICD-10-CM | POA: Diagnosis not present

## 2017-02-19 DIAGNOSIS — I129 Hypertensive chronic kidney disease with stage 1 through stage 4 chronic kidney disease, or unspecified chronic kidney disease: Secondary | ICD-10-CM | POA: Diagnosis not present

## 2017-02-19 DIAGNOSIS — D696 Thrombocytopenia, unspecified: Secondary | ICD-10-CM | POA: Diagnosis not present

## 2017-02-19 DIAGNOSIS — R3129 Other microscopic hematuria: Secondary | ICD-10-CM | POA: Diagnosis not present

## 2017-02-19 DIAGNOSIS — D46Z Other myelodysplastic syndromes: Secondary | ICD-10-CM

## 2017-02-19 DIAGNOSIS — N183 Chronic kidney disease, stage 3 (moderate): Secondary | ICD-10-CM | POA: Diagnosis not present

## 2017-02-19 DIAGNOSIS — Z5112 Encounter for antineoplastic immunotherapy: Secondary | ICD-10-CM | POA: Diagnosis not present

## 2017-02-19 DIAGNOSIS — E782 Mixed hyperlipidemia: Secondary | ICD-10-CM

## 2017-02-19 DIAGNOSIS — K219 Gastro-esophageal reflux disease without esophagitis: Secondary | ICD-10-CM | POA: Diagnosis not present

## 2017-02-19 DIAGNOSIS — E1122 Type 2 diabetes mellitus with diabetic chronic kidney disease: Secondary | ICD-10-CM | POA: Diagnosis not present

## 2017-02-19 MED ORDER — AZACITIDINE CHEMO SQ INJECTION
130.0000 mg | Freq: Once | INTRAMUSCULAR | Status: AC
  Administered 2017-02-19: 130 mg via SUBCUTANEOUS
  Filled 2017-02-19: qty 5.2

## 2017-02-19 MED ORDER — ONDANSETRON HCL 4 MG PO TABS
8.0000 mg | ORAL_TABLET | Freq: Once | ORAL | Status: AC
  Administered 2017-02-19: 8 mg via ORAL
  Filled 2017-02-19: qty 2

## 2017-02-20 ENCOUNTER — Inpatient Hospital Stay: Payer: Medicare Other

## 2017-02-20 VITALS — BP 116/71 | HR 67 | Temp 98.0°F | Resp 20

## 2017-02-20 DIAGNOSIS — Z5112 Encounter for antineoplastic immunotherapy: Secondary | ICD-10-CM | POA: Diagnosis not present

## 2017-02-20 DIAGNOSIS — Z7984 Long term (current) use of oral hypoglycemic drugs: Secondary | ICD-10-CM | POA: Diagnosis not present

## 2017-02-20 DIAGNOSIS — R5382 Chronic fatigue, unspecified: Secondary | ICD-10-CM | POA: Diagnosis not present

## 2017-02-20 DIAGNOSIS — Z87891 Personal history of nicotine dependence: Secondary | ICD-10-CM | POA: Diagnosis not present

## 2017-02-20 DIAGNOSIS — G4730 Sleep apnea, unspecified: Secondary | ICD-10-CM | POA: Diagnosis not present

## 2017-02-20 DIAGNOSIS — D4622 Refractory anemia with excess of blasts 2: Secondary | ICD-10-CM | POA: Diagnosis not present

## 2017-02-20 DIAGNOSIS — Z87442 Personal history of urinary calculi: Secondary | ICD-10-CM | POA: Diagnosis not present

## 2017-02-20 DIAGNOSIS — N183 Chronic kidney disease, stage 3 (moderate): Secondary | ICD-10-CM | POA: Diagnosis not present

## 2017-02-20 DIAGNOSIS — Z79899 Other long term (current) drug therapy: Secondary | ICD-10-CM | POA: Diagnosis not present

## 2017-02-20 DIAGNOSIS — E785 Hyperlipidemia, unspecified: Secondary | ICD-10-CM | POA: Diagnosis not present

## 2017-02-20 DIAGNOSIS — D46Z Other myelodysplastic syndromes: Secondary | ICD-10-CM

## 2017-02-20 DIAGNOSIS — I129 Hypertensive chronic kidney disease with stage 1 through stage 4 chronic kidney disease, or unspecified chronic kidney disease: Secondary | ICD-10-CM | POA: Diagnosis not present

## 2017-02-20 DIAGNOSIS — D696 Thrombocytopenia, unspecified: Secondary | ICD-10-CM | POA: Diagnosis not present

## 2017-02-20 DIAGNOSIS — K219 Gastro-esophageal reflux disease without esophagitis: Secondary | ICD-10-CM | POA: Diagnosis not present

## 2017-02-20 DIAGNOSIS — E1122 Type 2 diabetes mellitus with diabetic chronic kidney disease: Secondary | ICD-10-CM | POA: Diagnosis not present

## 2017-02-20 DIAGNOSIS — R3129 Other microscopic hematuria: Secondary | ICD-10-CM | POA: Diagnosis not present

## 2017-02-20 MED ORDER — ONDANSETRON HCL 4 MG PO TABS
8.0000 mg | ORAL_TABLET | Freq: Once | ORAL | Status: AC
  Administered 2017-02-20: 8 mg via ORAL
  Filled 2017-02-20: qty 2

## 2017-02-20 MED ORDER — AZACITIDINE CHEMO SQ INJECTION
130.0000 mg | Freq: Once | INTRAMUSCULAR | Status: AC
  Administered 2017-02-20: 130 mg via SUBCUTANEOUS
  Filled 2017-02-20: qty 5.2

## 2017-02-21 ENCOUNTER — Inpatient Hospital Stay: Payer: Medicare Other

## 2017-02-21 VITALS — BP 149/79 | HR 85 | Temp 96.1°F | Resp 20

## 2017-02-21 DIAGNOSIS — I129 Hypertensive chronic kidney disease with stage 1 through stage 4 chronic kidney disease, or unspecified chronic kidney disease: Secondary | ICD-10-CM | POA: Diagnosis not present

## 2017-02-21 DIAGNOSIS — N183 Chronic kidney disease, stage 3 (moderate): Secondary | ICD-10-CM | POA: Diagnosis not present

## 2017-02-21 DIAGNOSIS — G4730 Sleep apnea, unspecified: Secondary | ICD-10-CM | POA: Diagnosis not present

## 2017-02-21 DIAGNOSIS — R5382 Chronic fatigue, unspecified: Secondary | ICD-10-CM | POA: Diagnosis not present

## 2017-02-21 DIAGNOSIS — Z87442 Personal history of urinary calculi: Secondary | ICD-10-CM | POA: Diagnosis not present

## 2017-02-21 DIAGNOSIS — Z87891 Personal history of nicotine dependence: Secondary | ICD-10-CM | POA: Diagnosis not present

## 2017-02-21 DIAGNOSIS — E785 Hyperlipidemia, unspecified: Secondary | ICD-10-CM | POA: Diagnosis not present

## 2017-02-21 DIAGNOSIS — Z79899 Other long term (current) drug therapy: Secondary | ICD-10-CM | POA: Diagnosis not present

## 2017-02-21 DIAGNOSIS — E1122 Type 2 diabetes mellitus with diabetic chronic kidney disease: Secondary | ICD-10-CM | POA: Diagnosis not present

## 2017-02-21 DIAGNOSIS — R3129 Other microscopic hematuria: Secondary | ICD-10-CM | POA: Diagnosis not present

## 2017-02-21 DIAGNOSIS — D696 Thrombocytopenia, unspecified: Secondary | ICD-10-CM | POA: Diagnosis not present

## 2017-02-21 DIAGNOSIS — D4622 Refractory anemia with excess of blasts 2: Secondary | ICD-10-CM | POA: Diagnosis not present

## 2017-02-21 DIAGNOSIS — K219 Gastro-esophageal reflux disease without esophagitis: Secondary | ICD-10-CM | POA: Diagnosis not present

## 2017-02-21 DIAGNOSIS — Z5112 Encounter for antineoplastic immunotherapy: Secondary | ICD-10-CM | POA: Diagnosis not present

## 2017-02-21 DIAGNOSIS — D46Z Other myelodysplastic syndromes: Secondary | ICD-10-CM

## 2017-02-21 DIAGNOSIS — Z7984 Long term (current) use of oral hypoglycemic drugs: Secondary | ICD-10-CM | POA: Diagnosis not present

## 2017-02-21 MED ORDER — AZACITIDINE CHEMO SQ INJECTION
130.0000 mg | Freq: Once | INTRAMUSCULAR | Status: AC
  Administered 2017-02-21: 130 mg via SUBCUTANEOUS
  Filled 2017-02-21: qty 5.2

## 2017-02-21 MED ORDER — ONDANSETRON HCL 4 MG PO TABS
8.0000 mg | ORAL_TABLET | Freq: Once | ORAL | Status: AC
  Administered 2017-02-21: 8 mg via ORAL
  Filled 2017-02-21: qty 2

## 2017-02-24 ENCOUNTER — Inpatient Hospital Stay: Payer: Medicare Other

## 2017-02-24 ENCOUNTER — Encounter: Payer: Self-pay | Admitting: *Deleted

## 2017-02-24 DIAGNOSIS — Z7984 Long term (current) use of oral hypoglycemic drugs: Secondary | ICD-10-CM | POA: Diagnosis not present

## 2017-02-24 DIAGNOSIS — D696 Thrombocytopenia, unspecified: Secondary | ICD-10-CM | POA: Diagnosis not present

## 2017-02-24 DIAGNOSIS — D631 Anemia in chronic kidney disease: Secondary | ICD-10-CM | POA: Diagnosis not present

## 2017-02-24 DIAGNOSIS — R809 Proteinuria, unspecified: Secondary | ICD-10-CM | POA: Diagnosis not present

## 2017-02-24 DIAGNOSIS — R3129 Other microscopic hematuria: Secondary | ICD-10-CM | POA: Diagnosis not present

## 2017-02-24 DIAGNOSIS — D4622 Refractory anemia with excess of blasts 2: Secondary | ICD-10-CM | POA: Diagnosis not present

## 2017-02-24 DIAGNOSIS — E1122 Type 2 diabetes mellitus with diabetic chronic kidney disease: Secondary | ICD-10-CM | POA: Diagnosis not present

## 2017-02-24 DIAGNOSIS — Z5112 Encounter for antineoplastic immunotherapy: Secondary | ICD-10-CM | POA: Diagnosis not present

## 2017-02-24 DIAGNOSIS — I129 Hypertensive chronic kidney disease with stage 1 through stage 4 chronic kidney disease, or unspecified chronic kidney disease: Secondary | ICD-10-CM | POA: Diagnosis not present

## 2017-02-24 DIAGNOSIS — Z79899 Other long term (current) drug therapy: Secondary | ICD-10-CM | POA: Diagnosis not present

## 2017-02-24 DIAGNOSIS — R5382 Chronic fatigue, unspecified: Secondary | ICD-10-CM | POA: Diagnosis not present

## 2017-02-24 DIAGNOSIS — G4730 Sleep apnea, unspecified: Secondary | ICD-10-CM | POA: Diagnosis not present

## 2017-02-24 DIAGNOSIS — Z87442 Personal history of urinary calculi: Secondary | ICD-10-CM | POA: Diagnosis not present

## 2017-02-24 DIAGNOSIS — D46Z Other myelodysplastic syndromes: Secondary | ICD-10-CM

## 2017-02-24 DIAGNOSIS — N183 Chronic kidney disease, stage 3 (moderate): Secondary | ICD-10-CM | POA: Diagnosis not present

## 2017-02-24 DIAGNOSIS — E785 Hyperlipidemia, unspecified: Secondary | ICD-10-CM | POA: Diagnosis not present

## 2017-02-24 DIAGNOSIS — K219 Gastro-esophageal reflux disease without esophagitis: Secondary | ICD-10-CM | POA: Diagnosis not present

## 2017-02-24 DIAGNOSIS — Z87891 Personal history of nicotine dependence: Secondary | ICD-10-CM | POA: Diagnosis not present

## 2017-02-24 LAB — COMPREHENSIVE METABOLIC PANEL
ALBUMIN: 3.8 g/dL (ref 3.5–5.0)
ALK PHOS: 74 U/L (ref 38–126)
ALT: 18 U/L (ref 14–54)
ANION GAP: 9 (ref 5–15)
AST: 20 U/L (ref 15–41)
BUN: 21 mg/dL — ABNORMAL HIGH (ref 6–20)
CALCIUM: 9.1 mg/dL (ref 8.9–10.3)
CO2: 28 mmol/L (ref 22–32)
CREATININE: 1.16 mg/dL — AB (ref 0.44–1.00)
Chloride: 102 mmol/L (ref 101–111)
GFR calc Af Amer: 52 mL/min — ABNORMAL LOW (ref >60)
GFR calc non Af Amer: 45 mL/min — ABNORMAL LOW (ref >60)
GLUCOSE: 191 mg/dL — AB (ref 65–99)
Potassium: 4.2 mmol/L (ref 3.5–5.1)
SODIUM: 139 mmol/L (ref 135–145)
Total Bilirubin: 0.4 mg/dL (ref 0.3–1.2)
Total Protein: 7.3 g/dL (ref 6.5–8.1)

## 2017-02-24 LAB — CBC WITH DIFFERENTIAL/PLATELET
BASOS ABS: 0 10*3/uL (ref 0–0.1)
Basophils Relative: 1 %
Eosinophils Absolute: 0 10*3/uL (ref 0–0.7)
Eosinophils Relative: 0 %
HEMATOCRIT: 24.6 % — AB (ref 35.0–47.0)
HEMOGLOBIN: 8.5 g/dL — AB (ref 12.0–16.0)
LYMPHS PCT: 44 %
Lymphs Abs: 0.9 10*3/uL — ABNORMAL LOW (ref 1.0–3.6)
MCH: 33.1 pg (ref 26.0–34.0)
MCHC: 34.4 g/dL (ref 32.0–36.0)
MCV: 96.1 fL (ref 80.0–100.0)
Monocytes Absolute: 0.1 10*3/uL — ABNORMAL LOW (ref 0.2–0.9)
Monocytes Relative: 6 %
NEUTROS ABS: 1.1 10*3/uL — AB (ref 1.4–6.5)
Neutrophils Relative %: 49 %
Platelets: 94 10*3/uL — ABNORMAL LOW (ref 150–440)
RBC: 2.57 MIL/uL — AB (ref 3.80–5.20)
RDW: 20.6 % — ABNORMAL HIGH (ref 11.5–14.5)
WBC: 2.1 10*3/uL — AB (ref 3.6–11.0)

## 2017-02-24 LAB — SAMPLE TO BLOOD BANK

## 2017-03-03 ENCOUNTER — Inpatient Hospital Stay: Payer: Medicare Other

## 2017-03-03 DIAGNOSIS — R5382 Chronic fatigue, unspecified: Secondary | ICD-10-CM | POA: Diagnosis not present

## 2017-03-03 DIAGNOSIS — I129 Hypertensive chronic kidney disease with stage 1 through stage 4 chronic kidney disease, or unspecified chronic kidney disease: Secondary | ICD-10-CM | POA: Diagnosis not present

## 2017-03-03 DIAGNOSIS — R3129 Other microscopic hematuria: Secondary | ICD-10-CM | POA: Diagnosis not present

## 2017-03-03 DIAGNOSIS — K219 Gastro-esophageal reflux disease without esophagitis: Secondary | ICD-10-CM | POA: Diagnosis not present

## 2017-03-03 DIAGNOSIS — Z87442 Personal history of urinary calculi: Secondary | ICD-10-CM | POA: Diagnosis not present

## 2017-03-03 DIAGNOSIS — D696 Thrombocytopenia, unspecified: Secondary | ICD-10-CM | POA: Diagnosis not present

## 2017-03-03 DIAGNOSIS — D46Z Other myelodysplastic syndromes: Secondary | ICD-10-CM

## 2017-03-03 DIAGNOSIS — Z79899 Other long term (current) drug therapy: Secondary | ICD-10-CM | POA: Diagnosis not present

## 2017-03-03 DIAGNOSIS — Z87891 Personal history of nicotine dependence: Secondary | ICD-10-CM | POA: Diagnosis not present

## 2017-03-03 DIAGNOSIS — N183 Chronic kidney disease, stage 3 (moderate): Secondary | ICD-10-CM | POA: Diagnosis not present

## 2017-03-03 DIAGNOSIS — G4730 Sleep apnea, unspecified: Secondary | ICD-10-CM | POA: Diagnosis not present

## 2017-03-03 DIAGNOSIS — Z5112 Encounter for antineoplastic immunotherapy: Secondary | ICD-10-CM | POA: Diagnosis not present

## 2017-03-03 DIAGNOSIS — Z7984 Long term (current) use of oral hypoglycemic drugs: Secondary | ICD-10-CM | POA: Diagnosis not present

## 2017-03-03 DIAGNOSIS — E1122 Type 2 diabetes mellitus with diabetic chronic kidney disease: Secondary | ICD-10-CM | POA: Diagnosis not present

## 2017-03-03 DIAGNOSIS — D4622 Refractory anemia with excess of blasts 2: Secondary | ICD-10-CM | POA: Diagnosis not present

## 2017-03-03 DIAGNOSIS — E785 Hyperlipidemia, unspecified: Secondary | ICD-10-CM | POA: Diagnosis not present

## 2017-03-03 LAB — CBC WITH DIFFERENTIAL/PLATELET
BASOS ABS: 0 10*3/uL (ref 0–0.1)
BASOS PCT: 1 %
Eosinophils Absolute: 0 10*3/uL (ref 0–0.7)
Eosinophils Relative: 0 %
HEMATOCRIT: 24.5 % — AB (ref 35.0–47.0)
Hemoglobin: 8.3 g/dL — ABNORMAL LOW (ref 12.0–16.0)
LYMPHS PCT: 38 %
Lymphs Abs: 1 10*3/uL (ref 1.0–3.6)
MCH: 33.2 pg (ref 26.0–34.0)
MCHC: 33.8 g/dL (ref 32.0–36.0)
MCV: 98.2 fL (ref 80.0–100.0)
Monocytes Absolute: 0.1 10*3/uL — ABNORMAL LOW (ref 0.2–0.9)
Monocytes Relative: 4 %
NEUTROS ABS: 1.5 10*3/uL (ref 1.4–6.5)
Neutrophils Relative %: 57 %
PLATELETS: 46 10*3/uL — AB (ref 150–440)
RBC: 2.49 MIL/uL — AB (ref 3.80–5.20)
RDW: 19.9 % — ABNORMAL HIGH (ref 11.5–14.5)
WBC: 2.6 10*3/uL — AB (ref 3.6–11.0)

## 2017-03-03 LAB — COMPREHENSIVE METABOLIC PANEL
ALBUMIN: 4 g/dL (ref 3.5–5.0)
ALK PHOS: 84 U/L (ref 38–126)
ALT: 20 U/L (ref 14–54)
ANION GAP: 8 (ref 5–15)
AST: 24 U/L (ref 15–41)
BUN: 24 mg/dL — ABNORMAL HIGH (ref 6–20)
CALCIUM: 9.3 mg/dL (ref 8.9–10.3)
CHLORIDE: 107 mmol/L (ref 101–111)
CO2: 25 mmol/L (ref 22–32)
Creatinine, Ser: 1.17 mg/dL — ABNORMAL HIGH (ref 0.44–1.00)
GFR calc Af Amer: 51 mL/min — ABNORMAL LOW (ref >60)
GFR calc non Af Amer: 44 mL/min — ABNORMAL LOW (ref >60)
GLUCOSE: 155 mg/dL — AB (ref 65–99)
Potassium: 4.8 mmol/L (ref 3.5–5.1)
SODIUM: 140 mmol/L (ref 135–145)
Total Bilirubin: 0.5 mg/dL (ref 0.3–1.2)
Total Protein: 7.4 g/dL (ref 6.5–8.1)

## 2017-03-03 LAB — SAMPLE TO BLOOD BANK

## 2017-03-10 ENCOUNTER — Other Ambulatory Visit: Payer: Medicare Other

## 2017-03-10 ENCOUNTER — Ambulatory Visit: Payer: Medicare Other | Admitting: Internal Medicine

## 2017-03-10 ENCOUNTER — Telehealth: Payer: Self-pay | Admitting: *Deleted

## 2017-03-10 ENCOUNTER — Inpatient Hospital Stay: Payer: Medicare Other | Attending: Internal Medicine

## 2017-03-10 ENCOUNTER — Ambulatory Visit: Payer: Medicare Other

## 2017-03-10 DIAGNOSIS — R231 Pallor: Secondary | ICD-10-CM | POA: Insufficient documentation

## 2017-03-10 DIAGNOSIS — Z8719 Personal history of other diseases of the digestive system: Secondary | ICD-10-CM | POA: Insufficient documentation

## 2017-03-10 DIAGNOSIS — E1122 Type 2 diabetes mellitus with diabetic chronic kidney disease: Secondary | ICD-10-CM | POA: Diagnosis not present

## 2017-03-10 DIAGNOSIS — D696 Thrombocytopenia, unspecified: Secondary | ICD-10-CM | POA: Diagnosis not present

## 2017-03-10 DIAGNOSIS — E669 Obesity, unspecified: Secondary | ICD-10-CM | POA: Diagnosis not present

## 2017-03-10 DIAGNOSIS — Z7984 Long term (current) use of oral hypoglycemic drugs: Secondary | ICD-10-CM | POA: Diagnosis not present

## 2017-03-10 DIAGNOSIS — K219 Gastro-esophageal reflux disease without esophagitis: Secondary | ICD-10-CM | POA: Diagnosis not present

## 2017-03-10 DIAGNOSIS — E785 Hyperlipidemia, unspecified: Secondary | ICD-10-CM | POA: Diagnosis not present

## 2017-03-10 DIAGNOSIS — R5383 Other fatigue: Secondary | ICD-10-CM | POA: Diagnosis not present

## 2017-03-10 DIAGNOSIS — I129 Hypertensive chronic kidney disease with stage 1 through stage 4 chronic kidney disease, or unspecified chronic kidney disease: Secondary | ICD-10-CM | POA: Diagnosis not present

## 2017-03-10 DIAGNOSIS — Z87891 Personal history of nicotine dependence: Secondary | ICD-10-CM | POA: Diagnosis not present

## 2017-03-10 DIAGNOSIS — N183 Chronic kidney disease, stage 3 (moderate): Secondary | ICD-10-CM | POA: Insufficient documentation

## 2017-03-10 DIAGNOSIS — D4622 Refractory anemia with excess of blasts 2: Secondary | ICD-10-CM | POA: Diagnosis not present

## 2017-03-10 DIAGNOSIS — Z792 Long term (current) use of antibiotics: Secondary | ICD-10-CM | POA: Diagnosis not present

## 2017-03-10 DIAGNOSIS — Z87442 Personal history of urinary calculi: Secondary | ICD-10-CM | POA: Insufficient documentation

## 2017-03-10 DIAGNOSIS — Z79899 Other long term (current) drug therapy: Secondary | ICD-10-CM | POA: Insufficient documentation

## 2017-03-10 DIAGNOSIS — D46Z Other myelodysplastic syndromes: Secondary | ICD-10-CM

## 2017-03-10 DIAGNOSIS — F418 Other specified anxiety disorders: Secondary | ICD-10-CM | POA: Insufficient documentation

## 2017-03-10 LAB — COMPREHENSIVE METABOLIC PANEL
ALBUMIN: 3.9 g/dL (ref 3.5–5.0)
ALK PHOS: 89 U/L (ref 38–126)
ALT: 25 U/L (ref 14–54)
AST: 24 U/L (ref 15–41)
Anion gap: 8 (ref 5–15)
BILIRUBIN TOTAL: 0.4 mg/dL (ref 0.3–1.2)
BUN: 25 mg/dL — AB (ref 6–20)
CO2: 25 mmol/L (ref 22–32)
CREATININE: 1.13 mg/dL — AB (ref 0.44–1.00)
Calcium: 9.2 mg/dL (ref 8.9–10.3)
Chloride: 105 mmol/L (ref 101–111)
GFR calc Af Amer: 54 mL/min — ABNORMAL LOW (ref >60)
GFR calc non Af Amer: 46 mL/min — ABNORMAL LOW (ref >60)
GLUCOSE: 123 mg/dL — AB (ref 65–99)
POTASSIUM: 4.6 mmol/L (ref 3.5–5.1)
Sodium: 138 mmol/L (ref 135–145)
TOTAL PROTEIN: 7.4 g/dL (ref 6.5–8.1)

## 2017-03-10 LAB — CBC WITH DIFFERENTIAL/PLATELET
Basophils Absolute: 0 10*3/uL (ref 0–0.1)
Basophils Relative: 1 %
Eosinophils Absolute: 0 10*3/uL (ref 0–0.7)
Eosinophils Relative: 0 %
HEMATOCRIT: 24.5 % — AB (ref 35.0–47.0)
HEMOGLOBIN: 8.3 g/dL — AB (ref 12.0–16.0)
LYMPHS ABS: 1.3 10*3/uL (ref 1.0–3.6)
LYMPHS PCT: 40 %
MCH: 33.2 pg (ref 26.0–34.0)
MCHC: 34 g/dL (ref 32.0–36.0)
MCV: 97.6 fL (ref 80.0–100.0)
Monocytes Absolute: 0.1 10*3/uL — ABNORMAL LOW (ref 0.2–0.9)
Monocytes Relative: 3 %
NEUTROS ABS: 1.9 10*3/uL (ref 1.4–6.5)
NEUTROS PCT: 56 %
Platelets: 26 10*3/uL — CL (ref 150–400)
RBC: 2.51 MIL/uL — AB (ref 3.80–5.20)
RDW: 20.7 % — ABNORMAL HIGH (ref 11.5–14.5)
WBC: 3.4 10*3/uL — AB (ref 3.6–11.0)

## 2017-03-10 LAB — SAMPLE TO BLOOD BANK

## 2017-03-10 NOTE — Telephone Encounter (Signed)
1207 pm Critical plt count called to [name], RN by [employer] in c ctr. Plt count 26- read back process performed with lab tech. Read back process on critical value - 1208 with Dr. Rogue Bussing -   Per md- no orders for plt transfusion at this time. Keep scheduled apt next week for next plt check.

## 2017-03-11 ENCOUNTER — Ambulatory Visit: Payer: Medicare Other

## 2017-03-12 IMAGING — CR DG CHEST 2V
2 series · 2 of 2 positions shown · non-contrast
Comparison: 02/19/2016

CLINICAL DATA: Dry cough, sneezing for 4 days.  COPD.

EXAM:
CHEST  2 VIEW

[chest pa]
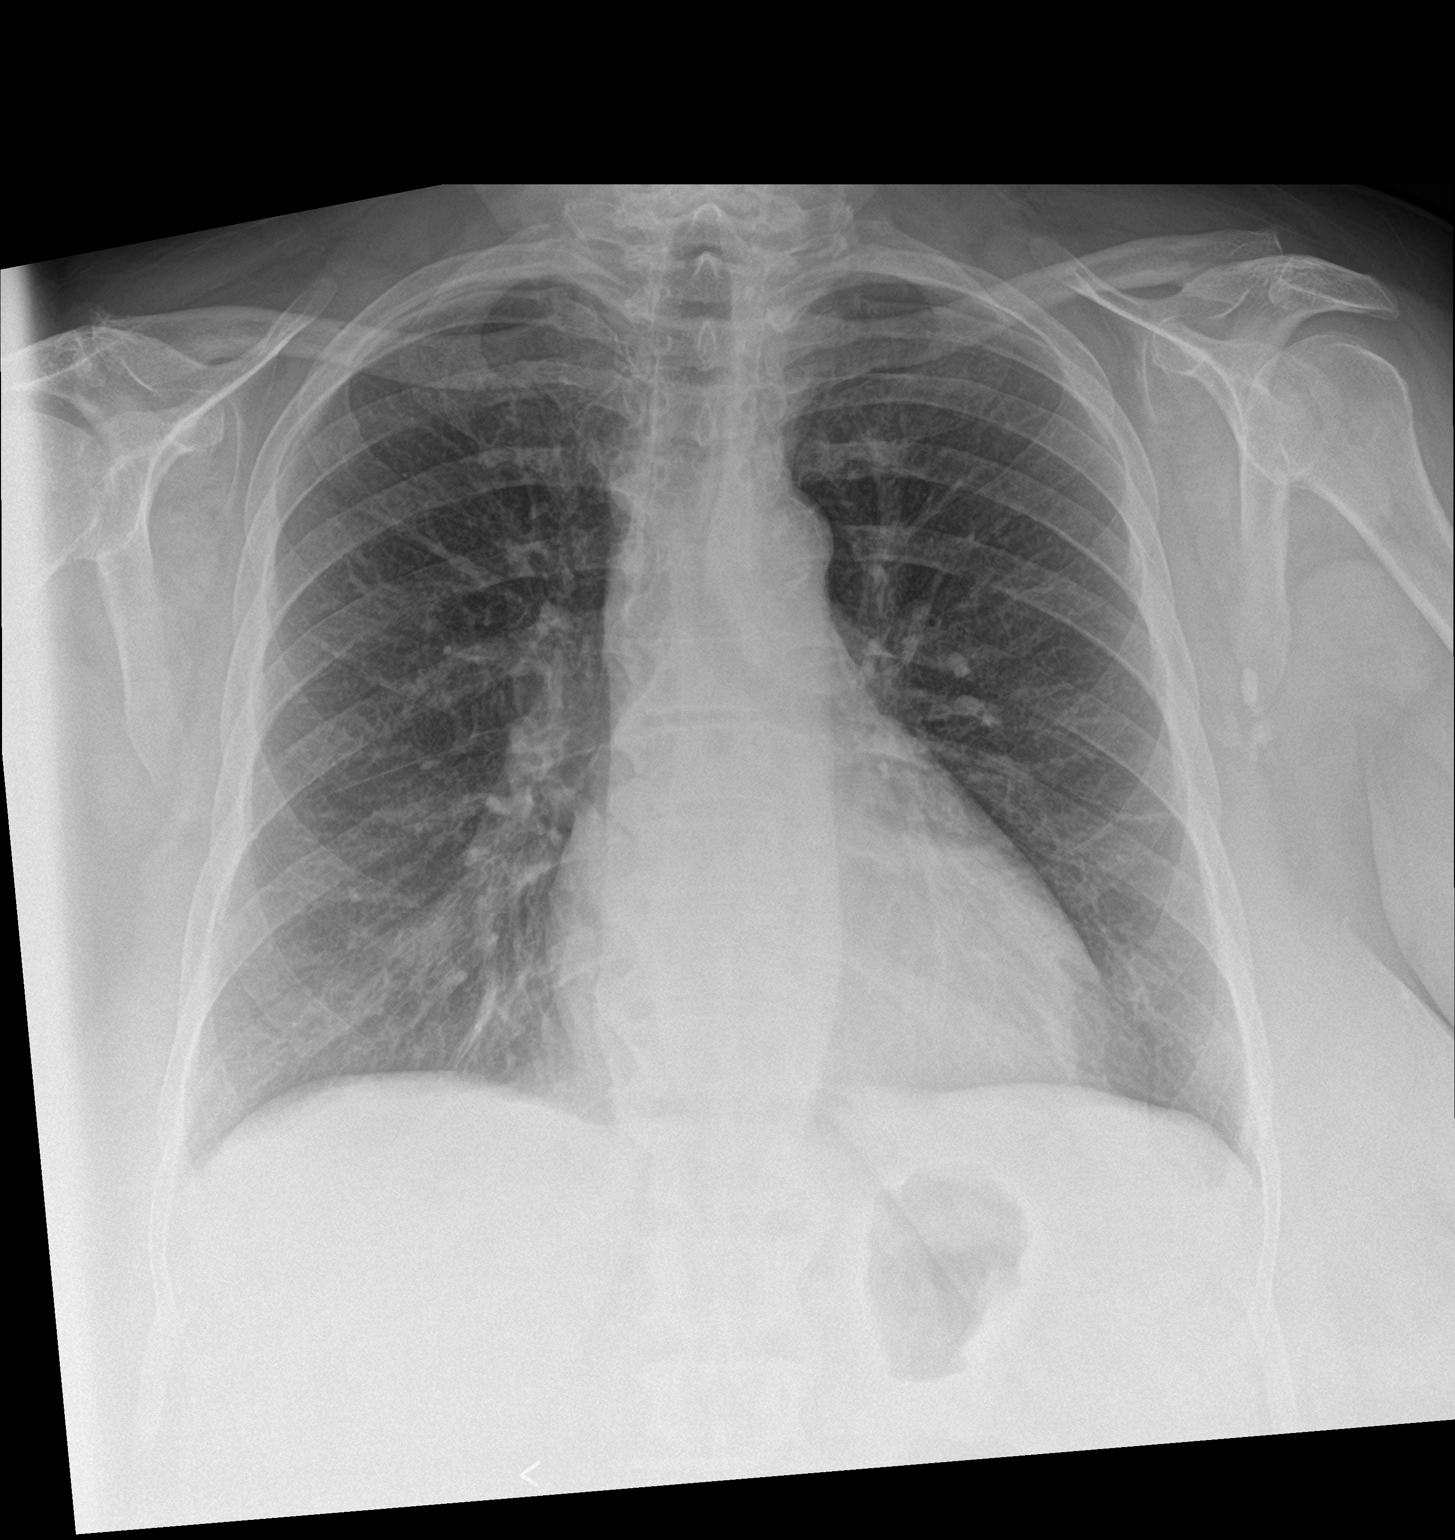

[chest lat]
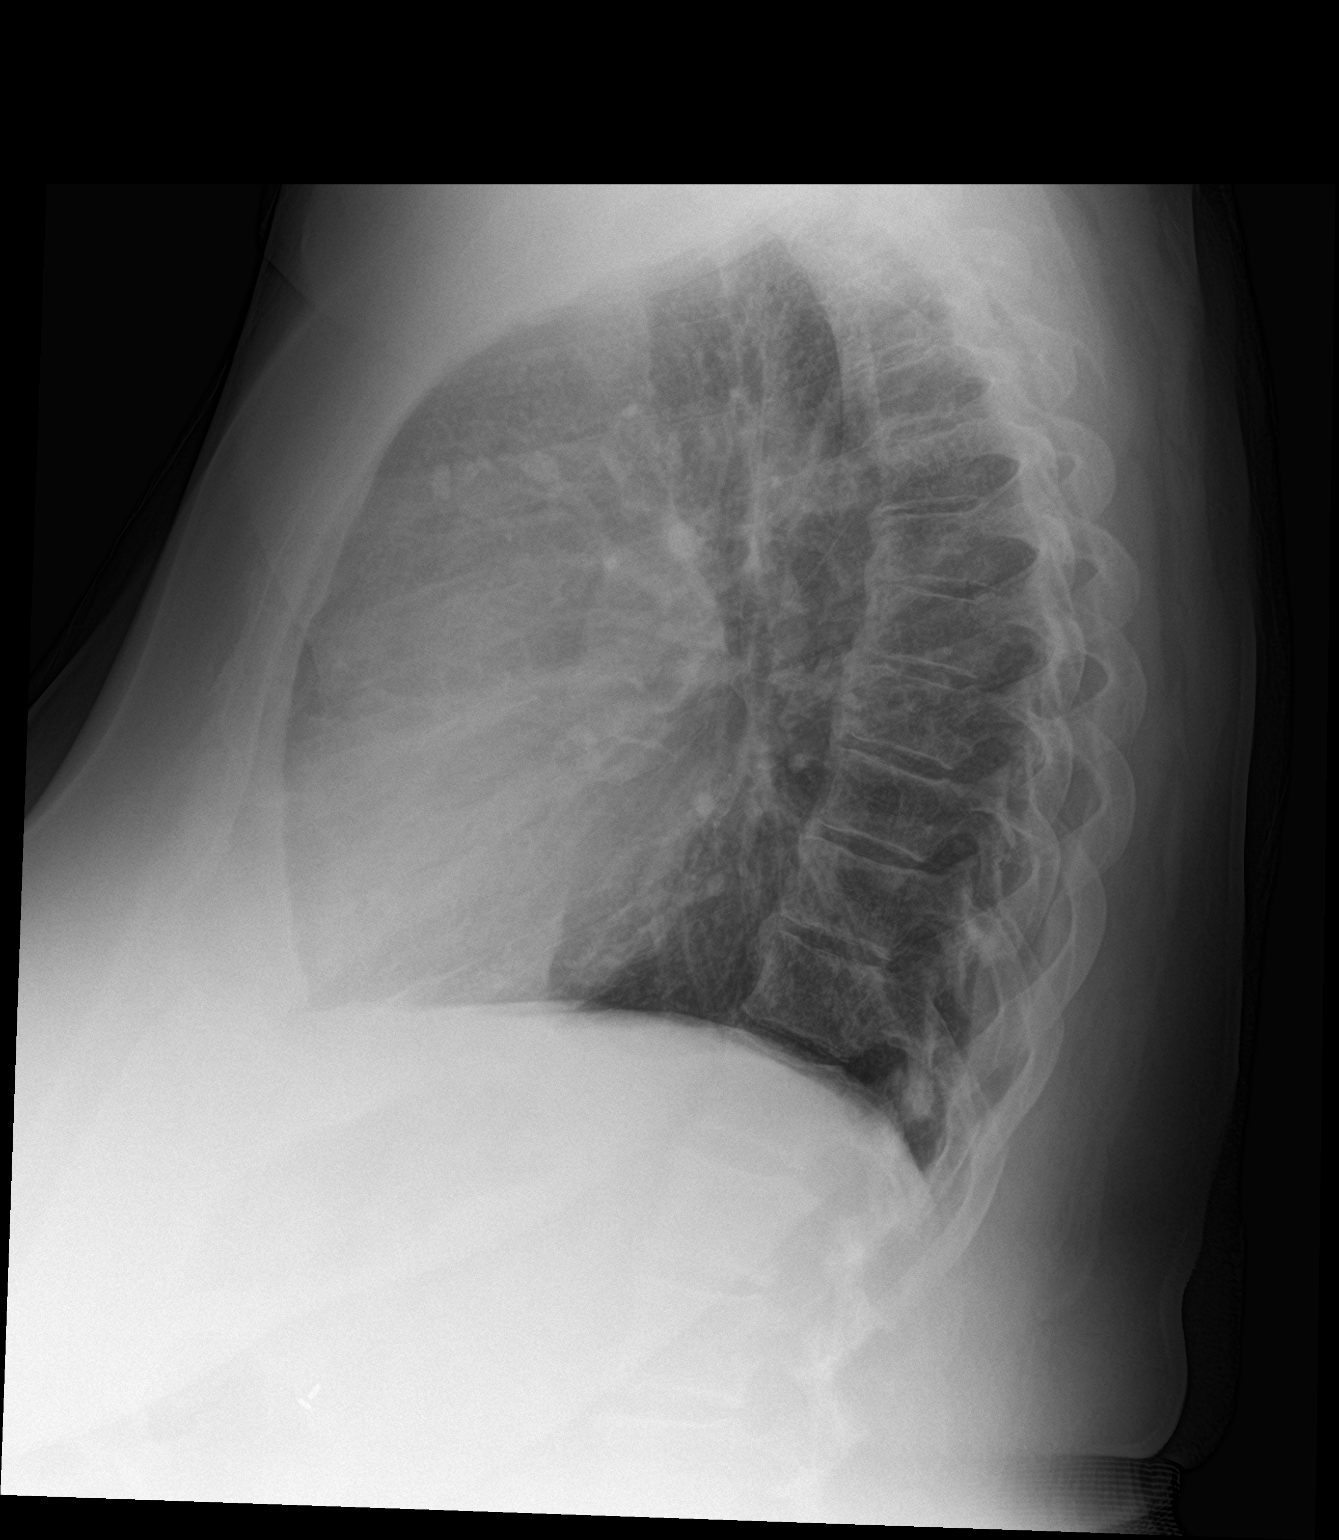

[2 of 2 positions shown; findings below may reference images not displayed]

FINDINGS: Heart and mediastinal contours are within normal limits. No focal
opacities or effusions. No acute bony abnormality.
IMPRESSION: No active cardiopulmonary disease.

## 2017-03-13 ENCOUNTER — Ambulatory Visit: Payer: Medicare Other

## 2017-03-14 ENCOUNTER — Ambulatory Visit: Payer: Medicare Other

## 2017-03-17 ENCOUNTER — Inpatient Hospital Stay: Payer: Medicare Other

## 2017-03-17 ENCOUNTER — Other Ambulatory Visit: Payer: Self-pay

## 2017-03-17 ENCOUNTER — Inpatient Hospital Stay (HOSPITAL_BASED_OUTPATIENT_CLINIC_OR_DEPARTMENT_OTHER): Payer: Medicare Other | Admitting: Internal Medicine

## 2017-03-17 VITALS — BP 130/73 | HR 70 | Temp 97.3°F | Wt 238.0 lb

## 2017-03-17 DIAGNOSIS — Z792 Long term (current) use of antibiotics: Secondary | ICD-10-CM | POA: Diagnosis not present

## 2017-03-17 DIAGNOSIS — Z79899 Other long term (current) drug therapy: Secondary | ICD-10-CM | POA: Diagnosis not present

## 2017-03-17 DIAGNOSIS — R5383 Other fatigue: Secondary | ICD-10-CM

## 2017-03-17 DIAGNOSIS — Z87891 Personal history of nicotine dependence: Secondary | ICD-10-CM

## 2017-03-17 DIAGNOSIS — I129 Hypertensive chronic kidney disease with stage 1 through stage 4 chronic kidney disease, or unspecified chronic kidney disease: Secondary | ICD-10-CM | POA: Diagnosis not present

## 2017-03-17 DIAGNOSIS — E669 Obesity, unspecified: Secondary | ICD-10-CM | POA: Diagnosis not present

## 2017-03-17 DIAGNOSIS — D696 Thrombocytopenia, unspecified: Secondary | ICD-10-CM

## 2017-03-17 DIAGNOSIS — R231 Pallor: Secondary | ICD-10-CM | POA: Diagnosis not present

## 2017-03-17 DIAGNOSIS — D4622 Refractory anemia with excess of blasts 2: Secondary | ICD-10-CM

## 2017-03-17 DIAGNOSIS — Z8719 Personal history of other diseases of the digestive system: Secondary | ICD-10-CM | POA: Diagnosis not present

## 2017-03-17 DIAGNOSIS — E785 Hyperlipidemia, unspecified: Secondary | ICD-10-CM | POA: Diagnosis not present

## 2017-03-17 DIAGNOSIS — E1122 Type 2 diabetes mellitus with diabetic chronic kidney disease: Secondary | ICD-10-CM

## 2017-03-17 DIAGNOSIS — F418 Other specified anxiety disorders: Secondary | ICD-10-CM | POA: Diagnosis not present

## 2017-03-17 DIAGNOSIS — Z7984 Long term (current) use of oral hypoglycemic drugs: Secondary | ICD-10-CM

## 2017-03-17 DIAGNOSIS — N183 Chronic kidney disease, stage 3 (moderate): Secondary | ICD-10-CM | POA: Diagnosis not present

## 2017-03-17 DIAGNOSIS — D46Z Other myelodysplastic syndromes: Secondary | ICD-10-CM

## 2017-03-17 DIAGNOSIS — Z87442 Personal history of urinary calculi: Secondary | ICD-10-CM

## 2017-03-17 DIAGNOSIS — K219 Gastro-esophageal reflux disease without esophagitis: Secondary | ICD-10-CM

## 2017-03-17 LAB — COMPREHENSIVE METABOLIC PANEL
ALBUMIN: 3.7 g/dL (ref 3.5–5.0)
ALK PHOS: 73 U/L (ref 38–126)
ALT: 25 U/L (ref 14–54)
AST: 22 U/L (ref 15–41)
Anion gap: 8 (ref 5–15)
BUN: 22 mg/dL — ABNORMAL HIGH (ref 6–20)
CALCIUM: 9.1 mg/dL (ref 8.9–10.3)
CHLORIDE: 106 mmol/L (ref 101–111)
CO2: 26 mmol/L (ref 22–32)
CREATININE: 1.13 mg/dL — AB (ref 0.44–1.00)
GFR calc Af Amer: 54 mL/min — ABNORMAL LOW (ref >60)
GFR calc non Af Amer: 46 mL/min — ABNORMAL LOW (ref >60)
GLUCOSE: 134 mg/dL — AB (ref 65–99)
Potassium: 4.5 mmol/L (ref 3.5–5.1)
SODIUM: 140 mmol/L (ref 135–145)
Total Bilirubin: 0.4 mg/dL (ref 0.3–1.2)
Total Protein: 7.4 g/dL (ref 6.5–8.1)

## 2017-03-17 LAB — CBC WITH DIFFERENTIAL/PLATELET
BASOS ABS: 0 10*3/uL (ref 0–0.1)
BASOS PCT: 1 %
Eosinophils Absolute: 0 10*3/uL (ref 0–0.7)
Eosinophils Relative: 0 %
HEMATOCRIT: 24.2 % — AB (ref 35.0–47.0)
HEMOGLOBIN: 8.3 g/dL — AB (ref 12.0–16.0)
LYMPHS PCT: 46 %
Lymphs Abs: 1.3 10*3/uL (ref 1.0–3.6)
MCH: 33.6 pg (ref 26.0–34.0)
MCHC: 34.1 g/dL (ref 32.0–36.0)
MCV: 98.6 fL (ref 80.0–100.0)
MONO ABS: 0.1 10*3/uL — AB (ref 0.2–0.9)
Monocytes Relative: 5 %
NEUTROS ABS: 1.4 10*3/uL (ref 1.4–6.5)
NEUTROS PCT: 48 %
Platelets: 65 10*3/uL — ABNORMAL LOW (ref 150–440)
RBC: 2.46 MIL/uL — AB (ref 3.80–5.20)
RDW: 20.3 % — ABNORMAL HIGH (ref 11.5–14.5)
WBC: 3 10*3/uL — AB (ref 3.6–11.0)

## 2017-03-17 LAB — SAMPLE TO BLOOD BANK

## 2017-03-17 NOTE — Progress Notes (Signed)
Patient here today for follow up.  Patient c/o mouth soreness from last treatment.

## 2017-03-17 NOTE — Progress Notes (Signed)
[facility] NOTE  Patient Care Team: Roselee Nova, [name]  CHIEF COMPLAINTS/PURPOSE OF CONSULTATION:   Oncology History   #JUNE 2017- Severe neutropenia/ Anemia ~hb 9/platlets- 85-100 AUG 2017- REFRACTORY ANEMIA with EXCESS BLASTS [14% blasts- BMBx]; cytogenetics/FISH-N [SNP micorarray- not done]   # AUG 21st-  START AZA 2m/m2 day- 1-7 q 28 days x4 cycles; DEC 6th- BMBx- <5% blasts; hypercellular with dysplasia.   # CKD stage III     MDS (myelodysplastic syndrome), high grade ([place]   04/23/2016 Initial Diagnosis    MDS (myelodysplastic syndrome), high grade (HCC)         HISTORY OF PRESENTING ILLNESS:  [name]  female with above history of  MDS/high-grade currently started on Vidaza Status post cycle #10 appx 4 weeks ago is here for follow-up.  Patient has not needed any platelet transfusion recently. Otherwise she has not needed any PRBC transfusions the last many months.  Overall she feels well. Denies any bleeding. Denies any fevers. Appetite is fair. No weight loss. Denies any swelling in the legs. Chronic mild fatigue not any worse.  Today she is walking herself. Patient complained of mild soreness in the mouth postchemotherapy.  ROS: A complete 10 point review of system is done which is negative except mentioned above in history of present illness.  MEDICAL HISTORY:  Past Medical History:  Diagnosis Date  . Abdominal wall mass   . Anginal pain ([place]   . Anxiety   . Arthritis   . Calculus of kidney   . Cystitis   . Depression   . Diabetes mellitus without complication (HCC)    elevated A1c  . Dyspnea on exertion   . Elevated serum creatinine   . Fibrocystic breast disease   . GERD (gastroesophageal reflux disease)   . Hearing loss   . Heart murmur   . HTN (hypertension)   . Hyperlipidemia   . MDS (myelodysplastic syndrome), high grade ([place] 04/23/2016  . Microscopic hematuria   .  Mouth sores   . Mucositis due to chemotherapy   . Obesity   . Risk for falls   . Sleep apnea   . Thrombocytopenia ([place]   . Urinary frequency   . Urinary urgency     SURGICAL HISTORY: Past Surgical History:  Procedure Laterality Date  . ABDOMINAL HYSTERECTOMY    . APPENDECTOMY    . CARDIAC CATHETERIZATION     x2  . CHOLECYSTECTOMY    . COLONOSCOPY N/A 02/24/2015   Procedure: COLONOSCOPY;  Surgeon: RManya Silvas MD;  [facility];  Service: Endoscopy;  Laterality: N/A;  . DIAGNOSTIC LAPAROSCOPY     Removal of benign abdominal tumor  . ESOPHAGOGASTRODUODENOSCOPY N/A 02/24/2015   Procedure: ESOPHAGOGASTRODUODENOSCOPY (EGD);  Surgeon: RManya Silvas MD;  [facility];  Service: Endoscopy;  Laterality: N/A;  . right eye surgery Right     SOCIAL HISTORY: [place] family; [place] No smoking/ no alcohol.  Social History   Social History  . Marital status: Married    Spouse name: N/A  . Number of children: N/A  . Years of education: N/A   Occupational History  . Not on file.   Social History Main Topics  . Smoking status: Former Smoker    Quit date: 09/09/1988  . Smokeless tobacco: Never Used     Comment: quit 25 years ago  . Alcohol use No  . Drug  use: No  . Sexual activity: Not on file   Other Topics Concern  . Not on file   Social History Narrative  . No narrative on file    FAMILY HISTORY: no cancers in family.  Family History  Problem Relation Age of Onset  . Congestive Heart Failure Mother   . Diabetes Mother   . Coronary artery disease Mother   . Stroke Mother   . Cirrhosis Father     ALLERGIES:  is allergic to macrobid [nitrofurantoin monohyd macro].  MEDICATIONS:  Current Outpatient Prescriptions  Medication Sig Dispense Refill  . acyclovir (ZOVIRAX) 400 MG tablet TAKE 1 TABLET(400 MG) BY MOUTH TWICE DAILY 120 tablet 0  . buPROPion (WELLBUTRIN XL) 150 MG 24 hr tablet Take 300 mg by mouth  daily at 12 noon.     . clonazePAM (KLONOPIN) 0.5 MG tablet Take 0.5 mg by mouth 2 (two) times daily.     . hydrocortisone (ANUSOL-HC) 2.5 % rectal cream Place 1 application rectally 2 (two) times daily as needed for hemorrhoids or itching. 30 g 0  . lisinopril (PRINIVIL,ZESTRIL) 2.5 MG tablet Take 1 tablet (2.5 mg total) by mouth daily. 90 tablet 0  . LUMIGAN 0.01 % SOLN Place 1 drop into both eyes at bedtime.     . magic mouthwash w/lidocaine SOLN Take 5 mLs by mouth 4 (four) times daily. 80 ml viscous lidocaine 2%, 80 ml Mylanta, 80 ml Diphenhydramine 12.5 mg/5 ml Elixir, 80 ml Nystatin 100,000 Unit suspension, 80 ml Prednisolone 15 mg/75m, 80 ml Distilled Water.  Sig: Swish/Swallow 5-10 ml four times a day as needed. Dispense 480 ml. 3RFs 480 mL 3  . metFORMIN (GLUCOPHAGE) 500 MG tablet TAKE 1 TABLET BY MOUTH DAILY 90 tablet 0  . Multiple Vitamin (MULTIVITAMIN WITH MINERALS) TABS tablet Take 1 tablet by mouth daily.    . Omega-3 Fatty Acids (FISH OIL) 1000 MG CAPS Take 1 capsule by mouth daily.    . ondansetron (ZOFRAN-ODT) 4 MG disintegrating tablet Take 4 mg by mouth every 8 (eight) hours as needed for nausea or vomiting.    . polyethylene glycol (MIRALAX / GLYCOLAX) packet Take 17 g by mouth daily as needed for mild constipation. 14 each 1  . prochlorperazine (COMPAZINE) 10 MG tablet Take 1 tablet (10 mg total) by mouth every 6 (six) hours as needed (Nausea or vomiting). 30 tablet 1  . rosuvastatin (CRESTOR) 20 MG tablet TAKE 1 TABLET(20 MG) BY MOUTH AT BEDTIME 90 tablet 0  . sertraline (ZOLOFT) 100 MG tablet Take 100 mg by mouth daily at 12 noon.     .Marland KitchenUmeclidinium-Vilanterol (ANORO ELLIPTA) 62.5-25 MCG/INH AEPB Inhale 1 puff into the lungs daily as needed (shortness of breath).      No current facility-administered medications for this visit.       .Marland Kitchen PHYSICAL EXAMINATION: ECOG PERFORMANCE STATUS: 1 - Symptomatic but completely ambulatory  Vitals:   03/17/17 1046  BP: 130/73   Pulse: 70  Temp: (!) 97.3 F (36.3 C)   Filed Weights   03/17/17 1046  Weight: 238 lb (108 kg)    GENERAL: Well-nourished well-developed; Alert, no distress and comfortable.   Obese. Accompanied by her husband. She is NOT in  a wheel chair.  EYES: Positive for pallor. OROPHARYNX: no thrush or ulceration;  NECK: supple, no masses felt LYMPH:  no palpable lymphadenopathy in the cervical, axillary or inguinal regions LUNGS: clear to auscultation and  No wheeze or crackles HEART/CVS: regular rate & rhythm  and no murmurs; No lower extremity edema ABDOMEN: abdomen soft, non-tender and normal bowel sounds Musculoskeletal:no cyanosis of digits and no clubbing  PSYCH: alert & oriented x 3 with fluent speech NEURO: no focal motor/sensory deficits SKIN:  No skin rash/nodules.  LABORATORY DATA:  I have reviewed the data as listed Lab Results  Component Value Date   WBC 3.0 (L) 03/17/2017   HGB 8.3 (L) 03/17/2017   HCT 24.2 (L) 03/17/2017   MCV 98.6 03/17/2017   PLT 65 (L) 03/17/2017    Recent Labs  05/07/16 1652  03/03/17 1421 03/10/17 1144 03/17/17 1011  NA  --   < > 140 138 140  K  --   < > 4.8 4.6 4.5  CL  --   < > 107 105 106  CO2  --   < > _0 GLUCOSE  --   < > 155* 123* 134*  BUN  --   < > 24* 25* 22*  CREATININE  --   < > 1.17* 1.13* 1.13*  CALCIUM  --   < > 9.3 9.2 9.1  GFRNONAA  --   < > 44* 46* 46*  GFRAA  --   < > 51* 54* 54*  PROT 8.3*  < > 7.4 7.4 7.4  ALBUMIN 3.8  < > 4.0 3.9 3.7  AST 26  < > _1 ALT 25  < > _2 ALKPHOS 65  < > 84 89 73  BILITOT 0.6  < > 0.5 0.4 0.4  BILIDIR 0.1  --   --   --   --   IBILI 0.5  --   --   --   --   < > = values in this interval not displayed.  RADIOGRAPHIC STUDIES: I have personally reviewed the radiological images as listed and agreed with the findings in the report. No results found.  ASSESSMENT & PLAN:   MDS (myelodysplastic syndrome), high grade (HCC) Refractory anemia- with excess blasts- II  [blasts percentage 14%] FISH- normal karyotype. On vidaza; s/p  cycle # 8; on April 23rd 2018- BMX- Partial response noted [persistent high grade MDS; aspirate -sub-optimal for blasts evaluation; 90% hyper-cellular bone marrow; dysplastic changes]. As per my discussion with pathologist- overall bone marrow "improved " from previous marrows. Continue Vidaza.   # HOLD treatment #11 today; platelets- 65; Labs today reviewed; will plan starting next week.  See discussion below.  # Given the thrombocytopenia- likely combination of underlying MDS and secondary to Vidaza- we'll plan to space out the treatments every 5 weeks [currently reduced by 20% and also 5 treatment each cycle]. However if continues to get worse than suspect worsening MDS/transformation to leukemia and would recommend a bone marrow biopsy.  # Gingival inflammation- recommdn salt/baking soda rinses. If not improved then recommend evaluation with dentistry.  #CKD stage III-stable.  # Prophylactic antibiotics- acyclovir/ diflucan.   # follow up in 5 weeks 8-20/ labs weekly; chemo lab/next week.      Cammie Sickle, MD 03/17/2017 1:21 PM

## 2017-03-17 NOTE — Assessment & Plan Note (Addendum)
Refractory anemia- with excess blasts- II [blasts percentage 14%] FISH- normal karyotype. On vidaza; s/p  cycle # 8; on April 23rd 2018- BMX- Partial response noted [persistent high grade MDS; aspirate -sub-optimal for blasts evaluation; 90% hyper-cellular bone marrow; dysplastic changes]. As per my discussion with pathologist- overall bone marrow "improved " from previous marrows. Continue Vidaza.   # HOLD treatment #11 today; platelets- 65; Labs today reviewed; will plan starting next week.  See discussion below.  # Given the thrombocytopenia- likely combination of underlying MDS and secondary to Vidaza- we'll plan to space out the treatments every 5 weeks [currently reduced by 20% and also 5 treatment each cycle]. However if continues to get worse than suspect worsening MDS/transformation to leukemia and would recommend a bone marrow biopsy.  # Gingival inflammation- recommdn salt/baking soda rinses. If not improved then recommend evaluation with dentistry.  #CKD stage III-stable.  # Prophylactic antibiotics- acyclovir/ diflucan.   # follow up in 5 weeks 8-20/ labs weekly; chemo lab/next week.

## 2017-03-18 ENCOUNTER — Ambulatory Visit: Payer: Medicare Other

## 2017-03-19 ENCOUNTER — Ambulatory Visit: Payer: Medicare Other

## 2017-03-20 ENCOUNTER — Ambulatory Visit: Payer: Medicare Other

## 2017-03-21 ENCOUNTER — Ambulatory Visit: Payer: Medicare Other

## 2017-03-23 ENCOUNTER — Other Ambulatory Visit: Payer: Self-pay | Admitting: Internal Medicine

## 2017-03-23 DIAGNOSIS — D46Z Other myelodysplastic syndromes: Secondary | ICD-10-CM

## 2017-03-24 ENCOUNTER — Other Ambulatory Visit: Payer: Self-pay | Admitting: Internal Medicine

## 2017-03-24 ENCOUNTER — Inpatient Hospital Stay: Payer: Medicare Other

## 2017-03-24 VITALS — BP 121/69 | HR 79 | Temp 99.4°F | Resp 20

## 2017-03-24 DIAGNOSIS — N183 Chronic kidney disease, stage 3 (moderate): Secondary | ICD-10-CM | POA: Diagnosis not present

## 2017-03-24 DIAGNOSIS — R5383 Other fatigue: Secondary | ICD-10-CM | POA: Diagnosis not present

## 2017-03-24 DIAGNOSIS — K219 Gastro-esophageal reflux disease without esophagitis: Secondary | ICD-10-CM | POA: Diagnosis not present

## 2017-03-24 DIAGNOSIS — D696 Thrombocytopenia, unspecified: Secondary | ICD-10-CM | POA: Diagnosis not present

## 2017-03-24 DIAGNOSIS — I129 Hypertensive chronic kidney disease with stage 1 through stage 4 chronic kidney disease, or unspecified chronic kidney disease: Secondary | ICD-10-CM | POA: Diagnosis not present

## 2017-03-24 DIAGNOSIS — D46Z Other myelodysplastic syndromes: Secondary | ICD-10-CM

## 2017-03-24 DIAGNOSIS — Z8719 Personal history of other diseases of the digestive system: Secondary | ICD-10-CM | POA: Diagnosis not present

## 2017-03-24 DIAGNOSIS — R231 Pallor: Secondary | ICD-10-CM | POA: Diagnosis not present

## 2017-03-24 DIAGNOSIS — Z87891 Personal history of nicotine dependence: Secondary | ICD-10-CM | POA: Diagnosis not present

## 2017-03-24 DIAGNOSIS — Z79899 Other long term (current) drug therapy: Secondary | ICD-10-CM | POA: Diagnosis not present

## 2017-03-24 DIAGNOSIS — E1122 Type 2 diabetes mellitus with diabetic chronic kidney disease: Secondary | ICD-10-CM | POA: Diagnosis not present

## 2017-03-24 DIAGNOSIS — E785 Hyperlipidemia, unspecified: Secondary | ICD-10-CM | POA: Diagnosis not present

## 2017-03-24 DIAGNOSIS — Z7984 Long term (current) use of oral hypoglycemic drugs: Secondary | ICD-10-CM | POA: Diagnosis not present

## 2017-03-24 DIAGNOSIS — D4622 Refractory anemia with excess of blasts 2: Secondary | ICD-10-CM | POA: Diagnosis not present

## 2017-03-24 DIAGNOSIS — Z87442 Personal history of urinary calculi: Secondary | ICD-10-CM | POA: Diagnosis not present

## 2017-03-24 DIAGNOSIS — Z792 Long term (current) use of antibiotics: Secondary | ICD-10-CM | POA: Diagnosis not present

## 2017-03-24 LAB — CBC WITH DIFFERENTIAL/PLATELET
BASOS ABS: 0 10*3/uL (ref 0–0.1)
Basophils Relative: 2 %
Eosinophils Absolute: 0 10*3/uL (ref 0–0.7)
Eosinophils Relative: 1 %
HEMATOCRIT: 25.5 % — AB (ref 35.0–47.0)
Hemoglobin: 8.7 g/dL — ABNORMAL LOW (ref 12.0–16.0)
LYMPHS ABS: 1.1 10*3/uL (ref 1.0–3.6)
LYMPHS PCT: 46 %
MCH: 33.8 pg (ref 26.0–34.0)
MCHC: 34.3 g/dL (ref 32.0–36.0)
MCV: 98.6 fL (ref 80.0–100.0)
MONO ABS: 0.1 10*3/uL — AB (ref 0.2–0.9)
MONOS PCT: 5 %
NEUTROS ABS: 1.1 10*3/uL — AB (ref 1.4–6.5)
Neutrophils Relative %: 46 %
Platelets: 88 10*3/uL — ABNORMAL LOW (ref 150–440)
RBC: 2.59 MIL/uL — ABNORMAL LOW (ref 3.80–5.20)
RDW: 20 % — AB (ref 11.5–14.5)
WBC: 2.5 10*3/uL — ABNORMAL LOW (ref 3.6–11.0)

## 2017-03-24 LAB — COMPREHENSIVE METABOLIC PANEL
ALBUMIN: 3.7 g/dL (ref 3.5–5.0)
ALT: 21 U/L (ref 14–54)
ANION GAP: 8 (ref 5–15)
AST: 22 U/L (ref 15–41)
Alkaline Phosphatase: 74 U/L (ref 38–126)
BUN: 23 mg/dL — AB (ref 6–20)
CHLORIDE: 107 mmol/L (ref 101–111)
CO2: 26 mmol/L (ref 22–32)
Calcium: 9.2 mg/dL (ref 8.9–10.3)
Creatinine, Ser: 1.33 mg/dL — ABNORMAL HIGH (ref 0.44–1.00)
GFR calc Af Amer: 44 mL/min — ABNORMAL LOW (ref >60)
GFR calc non Af Amer: 38 mL/min — ABNORMAL LOW (ref >60)
GLUCOSE: 138 mg/dL — AB (ref 65–99)
POTASSIUM: 4.1 mmol/L (ref 3.5–5.1)
Sodium: 141 mmol/L (ref 135–145)
Total Bilirubin: 0.3 mg/dL (ref 0.3–1.2)
Total Protein: 7.3 g/dL (ref 6.5–8.1)

## 2017-03-24 LAB — SAMPLE TO BLOOD BANK

## 2017-03-24 MED ORDER — AZACITIDINE CHEMO SQ INJECTION
130.0000 mg | Freq: Once | INTRAMUSCULAR | Status: AC
  Administered 2017-03-24: 130 mg via SUBCUTANEOUS
  Filled 2017-03-24: qty 5.2

## 2017-03-24 MED ORDER — ONDANSETRON HCL 4 MG PO TABS
8.0000 mg | ORAL_TABLET | Freq: Once | ORAL | Status: AC
  Administered 2017-03-24: 8 mg via ORAL
  Filled 2017-03-24: qty 2

## 2017-03-25 ENCOUNTER — Inpatient Hospital Stay: Payer: Medicare Other

## 2017-03-25 ENCOUNTER — Other Ambulatory Visit: Payer: Self-pay | Admitting: Family Medicine

## 2017-03-25 VITALS — BP 116/67 | HR 77 | Temp 97.4°F | Resp 18

## 2017-03-25 DIAGNOSIS — E785 Hyperlipidemia, unspecified: Secondary | ICD-10-CM | POA: Diagnosis not present

## 2017-03-25 DIAGNOSIS — Z792 Long term (current) use of antibiotics: Secondary | ICD-10-CM | POA: Diagnosis not present

## 2017-03-25 DIAGNOSIS — I129 Hypertensive chronic kidney disease with stage 1 through stage 4 chronic kidney disease, or unspecified chronic kidney disease: Secondary | ICD-10-CM | POA: Diagnosis not present

## 2017-03-25 DIAGNOSIS — R231 Pallor: Secondary | ICD-10-CM | POA: Diagnosis not present

## 2017-03-25 DIAGNOSIS — E1122 Type 2 diabetes mellitus with diabetic chronic kidney disease: Secondary | ICD-10-CM | POA: Diagnosis not present

## 2017-03-25 DIAGNOSIS — K219 Gastro-esophageal reflux disease without esophagitis: Secondary | ICD-10-CM | POA: Diagnosis not present

## 2017-03-25 DIAGNOSIS — Z79899 Other long term (current) drug therapy: Secondary | ICD-10-CM | POA: Diagnosis not present

## 2017-03-25 DIAGNOSIS — D4622 Refractory anemia with excess of blasts 2: Secondary | ICD-10-CM | POA: Diagnosis not present

## 2017-03-25 DIAGNOSIS — R5383 Other fatigue: Secondary | ICD-10-CM | POA: Diagnosis not present

## 2017-03-25 DIAGNOSIS — E1121 Type 2 diabetes mellitus with diabetic nephropathy: Secondary | ICD-10-CM

## 2017-03-25 DIAGNOSIS — D46Z Other myelodysplastic syndromes: Secondary | ICD-10-CM

## 2017-03-25 DIAGNOSIS — Z87442 Personal history of urinary calculi: Secondary | ICD-10-CM | POA: Diagnosis not present

## 2017-03-25 DIAGNOSIS — Z8719 Personal history of other diseases of the digestive system: Secondary | ICD-10-CM | POA: Diagnosis not present

## 2017-03-25 DIAGNOSIS — Z7984 Long term (current) use of oral hypoglycemic drugs: Secondary | ICD-10-CM | POA: Diagnosis not present

## 2017-03-25 DIAGNOSIS — D696 Thrombocytopenia, unspecified: Secondary | ICD-10-CM | POA: Diagnosis not present

## 2017-03-25 DIAGNOSIS — Z87891 Personal history of nicotine dependence: Secondary | ICD-10-CM | POA: Diagnosis not present

## 2017-03-25 DIAGNOSIS — N183 Chronic kidney disease, stage 3 (moderate): Secondary | ICD-10-CM | POA: Diagnosis not present

## 2017-03-25 MED ORDER — AZACITIDINE CHEMO SQ INJECTION
130.0000 mg | Freq: Once | INTRAMUSCULAR | Status: AC
  Administered 2017-03-25: 130 mg via SUBCUTANEOUS
  Filled 2017-03-25: qty 5.2

## 2017-03-25 MED ORDER — ONDANSETRON HCL 4 MG PO TABS
8.0000 mg | ORAL_TABLET | Freq: Once | ORAL | Status: AC
  Administered 2017-03-25: 8 mg via ORAL
  Filled 2017-03-25: qty 2

## 2017-03-26 ENCOUNTER — Inpatient Hospital Stay: Payer: Medicare Other

## 2017-03-26 VITALS — BP 145/78 | HR 91 | Temp 97.8°F | Resp 18

## 2017-03-26 DIAGNOSIS — Z792 Long term (current) use of antibiotics: Secondary | ICD-10-CM | POA: Diagnosis not present

## 2017-03-26 DIAGNOSIS — Z7984 Long term (current) use of oral hypoglycemic drugs: Secondary | ICD-10-CM | POA: Diagnosis not present

## 2017-03-26 DIAGNOSIS — D4622 Refractory anemia with excess of blasts 2: Secondary | ICD-10-CM | POA: Diagnosis not present

## 2017-03-26 DIAGNOSIS — Z79899 Other long term (current) drug therapy: Secondary | ICD-10-CM | POA: Diagnosis not present

## 2017-03-26 DIAGNOSIS — D696 Thrombocytopenia, unspecified: Secondary | ICD-10-CM | POA: Diagnosis not present

## 2017-03-26 DIAGNOSIS — N183 Chronic kidney disease, stage 3 (moderate): Secondary | ICD-10-CM | POA: Diagnosis not present

## 2017-03-26 DIAGNOSIS — Z87442 Personal history of urinary calculi: Secondary | ICD-10-CM | POA: Diagnosis not present

## 2017-03-26 DIAGNOSIS — D46Z Other myelodysplastic syndromes: Secondary | ICD-10-CM

## 2017-03-26 DIAGNOSIS — I129 Hypertensive chronic kidney disease with stage 1 through stage 4 chronic kidney disease, or unspecified chronic kidney disease: Secondary | ICD-10-CM | POA: Diagnosis not present

## 2017-03-26 DIAGNOSIS — R231 Pallor: Secondary | ICD-10-CM | POA: Diagnosis not present

## 2017-03-26 DIAGNOSIS — E785 Hyperlipidemia, unspecified: Secondary | ICD-10-CM | POA: Diagnosis not present

## 2017-03-26 DIAGNOSIS — Z8719 Personal history of other diseases of the digestive system: Secondary | ICD-10-CM | POA: Diagnosis not present

## 2017-03-26 DIAGNOSIS — K219 Gastro-esophageal reflux disease without esophagitis: Secondary | ICD-10-CM | POA: Diagnosis not present

## 2017-03-26 DIAGNOSIS — E1122 Type 2 diabetes mellitus with diabetic chronic kidney disease: Secondary | ICD-10-CM | POA: Diagnosis not present

## 2017-03-26 DIAGNOSIS — R5383 Other fatigue: Secondary | ICD-10-CM | POA: Diagnosis not present

## 2017-03-26 DIAGNOSIS — Z87891 Personal history of nicotine dependence: Secondary | ICD-10-CM | POA: Diagnosis not present

## 2017-03-26 MED ORDER — ONDANSETRON HCL 4 MG PO TABS
8.0000 mg | ORAL_TABLET | Freq: Once | ORAL | Status: AC
  Administered 2017-03-26: 8 mg via ORAL
  Filled 2017-03-26: qty 2

## 2017-03-26 MED ORDER — AZACITIDINE CHEMO SQ INJECTION
130.0000 mg | Freq: Once | INTRAMUSCULAR | Status: AC
  Administered 2017-03-26: 130 mg via SUBCUTANEOUS
  Filled 2017-03-26: qty 5.2

## 2017-03-27 ENCOUNTER — Encounter: Payer: Self-pay | Admitting: Emergency Medicine

## 2017-03-27 ENCOUNTER — Other Ambulatory Visit: Payer: Self-pay | Admitting: Internal Medicine

## 2017-03-27 ENCOUNTER — Inpatient Hospital Stay: Payer: Medicare Other

## 2017-03-27 ENCOUNTER — Emergency Department
Admission: EM | Admit: 2017-03-27 | Discharge: 2017-03-27 | Disposition: A | Discharge status: Home / Self Care | Payer: Medicare Other | Attending: Emergency Medicine | Admitting: Emergency Medicine

## 2017-03-27 ENCOUNTER — Other Ambulatory Visit: Payer: Self-pay

## 2017-03-27 ENCOUNTER — Emergency Department: Payer: Medicare Other

## 2017-03-27 VITALS — BP 141/91 | HR 132 | Temp 98.0°F | Resp 22

## 2017-03-27 DIAGNOSIS — Z87891 Personal history of nicotine dependence: Secondary | ICD-10-CM | POA: Insufficient documentation

## 2017-03-27 DIAGNOSIS — Z79899 Other long term (current) drug therapy: Secondary | ICD-10-CM | POA: Diagnosis not present

## 2017-03-27 DIAGNOSIS — J449 Chronic obstructive pulmonary disease, unspecified: Secondary | ICD-10-CM | POA: Diagnosis not present

## 2017-03-27 DIAGNOSIS — R079 Chest pain, unspecified: Secondary | ICD-10-CM | POA: Diagnosis not present

## 2017-03-27 DIAGNOSIS — I10 Essential (primary) hypertension: Secondary | ICD-10-CM | POA: Insufficient documentation

## 2017-03-27 DIAGNOSIS — R0602 Shortness of breath: Secondary | ICD-10-CM | POA: Diagnosis not present

## 2017-03-27 DIAGNOSIS — E119 Type 2 diabetes mellitus without complications: Secondary | ICD-10-CM | POA: Insufficient documentation

## 2017-03-27 DIAGNOSIS — I4891 Unspecified atrial fibrillation: Secondary | ICD-10-CM

## 2017-03-27 DIAGNOSIS — D46Z Other myelodysplastic syndromes: Secondary | ICD-10-CM

## 2017-03-27 DIAGNOSIS — Z7984 Long term (current) use of oral hypoglycemic drugs: Secondary | ICD-10-CM | POA: Insufficient documentation

## 2017-03-27 DIAGNOSIS — I480 Paroxysmal atrial fibrillation: Secondary | ICD-10-CM | POA: Diagnosis not present

## 2017-03-27 LAB — BASIC METABOLIC PANEL
ANION GAP: 9 (ref 5–15)
BUN: 18 mg/dL (ref 6–20)
CHLORIDE: 107 mmol/L (ref 101–111)
CO2: 24 mmol/L (ref 22–32)
Calcium: 9.5 mg/dL (ref 8.9–10.3)
Creatinine, Ser: 1.36 mg/dL — ABNORMAL HIGH (ref 0.44–1.00)
GFR, EST AFRICAN AMERICAN: 43 mL/min — AB (ref >60)
GFR, EST NON AFRICAN AMERICAN: 37 mL/min — AB (ref >60)
Glucose, Bld: 158 mg/dL — ABNORMAL HIGH (ref 65–99)
POTASSIUM: 3.8 mmol/L (ref 3.5–5.1)
SODIUM: 140 mmol/L (ref 135–145)

## 2017-03-27 LAB — CBC
HEMATOCRIT: 28 % — AB (ref 35.0–47.0)
Hemoglobin: 9.3 g/dL — ABNORMAL LOW (ref 12.0–16.0)
MCH: 33.3 pg (ref 26.0–34.0)
MCHC: 33.4 g/dL (ref 32.0–36.0)
MCV: 99.9 fL (ref 80.0–100.0)
Platelets: 87 10*3/uL — ABNORMAL LOW (ref 150–440)
RBC: 2.8 MIL/uL — ABNORMAL LOW (ref 3.80–5.20)
RDW: 20.2 % — AB (ref 11.5–14.5)
WBC: 2.8 10*3/uL — ABNORMAL LOW (ref 3.6–11.0)

## 2017-03-27 LAB — TROPONIN I: Troponin I: <0.03 ng/mL (ref <0.03)

## 2017-03-27 LAB — TSH: TSH: 3.289 u[IU]/mL (ref 0.350–4.500)

## 2017-03-27 MED ORDER — ONDANSETRON HCL 4 MG PO TABS: 8.0000 mg | ORAL_TABLET | Freq: Once | ORAL | Status: DC

## 2017-03-27 MED ORDER — DILTIAZEM HCL 60 MG PO TABS
60.0000 mg | ORAL_TABLET | Freq: Once | ORAL | Status: AC
  Administered 2017-03-27: 60 mg via ORAL
  Filled 2017-03-27: qty 1

## 2017-03-27 MED ORDER — ASPIRIN 81 MG PO CHEW
162.0000 mg | CHEWABLE_TABLET | Freq: Once | ORAL | Status: AC
  Administered 2017-03-27: 162 mg via ORAL
  Filled 2017-03-27: qty 2

## 2017-03-27 MED ORDER — DILTIAZEM HCL ER COATED BEADS 180 MG PO CP24: 180.0000 mg | ORAL_CAPSULE | Freq: Every day | ORAL | 0 refills | Status: AC

## 2017-03-27 MED ORDER — DILTIAZEM HCL ER COATED BEADS 180 MG PO CP24
180.0000 mg | ORAL_CAPSULE | Freq: Once | ORAL | Status: AC
  Administered 2017-03-27: 180 mg via ORAL
  Filled 2017-03-27: qty 1

## 2017-03-27 MED ORDER — AZACITIDINE CHEMO SQ INJECTION
130.0000 mg | Freq: Once | INTRAMUSCULAR | Status: DC
  Filled 2017-03-27: qty 5.2

## 2017-03-27 NOTE — Discharge Instructions (Addendum)
Please take your Cardizem every day as prescribed and make an appointment to follow-up with your cardiologist this coming Monday for reexamination. Return to the emergency department sooner for any concerns.  It was a pleasure to take care of you today, and thank you for coming to our emergency department.  If you have any questions or concerns before leaving please ask the nurse to grab me and I'm more than happy to go through your aftercare instructions again.  If you were prescribed any opioid pain medication today such as Norco, Vicodin, Percocet, morphine, hydrocodone, or oxycodone please make sure you do not drive when you are taking this medication as it can alter your ability to drive safely.  If you have any concerns once you are home that you are not improving or are in fact getting worse before you can make it to your follow-up appointment, please do not hesitate to call 911 and come back for further evaluation.  Darel Hong MD  Results for orders placed or performed during the hospital encounter of 67/67/20  Basic metabolic panel  Result Value Ref Range   Sodium 140 135 - 145 mmol/L   Potassium 3.8 3.5 - 5.1 mmol/L   Chloride 107 101 - 111 mmol/L   CO2 24 22 - 32 mmol/L   Glucose, Bld 158 (H) 65 - 99 mg/dL   BUN 18 6 - 20 mg/dL   Creatinine, Ser 1.36 (H) 0.44 - 1.00 mg/dL   Calcium 9.5 8.9 - 10.3 mg/dL   GFR calc non Af Amer 37 (L) >60 mL/min   GFR calc Af Amer 43 (L) >60 mL/min   Anion gap 9 5 - 15  CBC  Result Value Ref Range   WBC 2.8 (L) 3.6 - 11.0 K/uL   RBC 2.80 (L) 3.80 - 5.20 MIL/uL   Hemoglobin 9.3 (L) 12.0 - 16.0 g/dL   HCT 28.0 (L) 35.0 - 47.0 %   MCV 99.9 80.0 - 100.0 fL   MCH 33.3 26.0 - 34.0 pg   MCHC 33.4 32.0 - 36.0 g/dL   RDW 20.2 (H) 11.5 - 14.5 %   Platelets 87 (L) 150 - 440 K/uL  Troponin I  Result Value Ref Range   Troponin I <0.03 <0.03 ng/mL  TSH  Result Value Ref Range   TSH 3.289 0.350 - 4.500 uIU/mL   Dg Chest 2 View  Result Date:  03/27/2017 CLINICAL DATA:  Shortness of breath and chest pain EXAM: CHEST  2 VIEW COMPARISON:  July 29, 2016 chest radiograph and chest CT Jan 23, 2015 FINDINGS: Lungs are clear. Heart size and pulmonary vascularity are normal. No adenopathy. There are calcified left axillary lymph nodes which do not appear enlarged. There is mild degenerative change in the thoracic spine. No pneumothorax. IMPRESSION: No edema or consolidation. Electronically Signed   By: Lowella Grip III M.D.   On: 03/27/2017 15:01

## 2017-03-27 NOTE — ED Provider Notes (Signed)
[facility] Emergency Department Provider Note  ____________________________________________   First MD Initiated Contact with Patient 03/27/17 1453     (approximate)  I have reviewed the triage vital signs and the nursing notes.   HISTORY  Chief Complaint Chest Pain    HPI Teresa Carrillo is a 76 y.o. female who self presents to the emergency department 3 hours after developing palpitations and moderate severity up her chest discomfort. The discomfort is been constant. Associated with mild shortness of breath. She's never felt this way before. She has a past medical history of COPD and myelodysplastic disorder. She has formerly seen Dr. Clayborn Bigness in the past for hypertension however she is currently not taking any antihypertensives.Symptoms are moderate nonradiating and constant. Nothing seems to make them better or worse.    Past Medical History:  Diagnosis Date  . Abdominal wall mass   . Anginal pain ([place])   . Anxiety   . Arthritis   . Calculus of kidney   . Cystitis   . Depression   . Diabetes mellitus without complication (HCC)    elevated A1c  . Dyspnea on exertion   . Elevated serum creatinine   . Fibrocystic breast disease   . GERD (gastroesophageal reflux disease)   . Hearing loss   . Heart murmur   . HTN (hypertension)   . Hyperlipidemia   . MDS (myelodysplastic syndrome), high grade ([place]) 04/23/2016  . Microscopic hematuria   . Mouth sores   . Mucositis due to chemotherapy   . Obesity   . Risk for falls   . Sleep apnea   . Thrombocytopenia ([place])   . Urinary frequency   . Urinary urgency     Patient Active Problem List   Diagnosis Date Noted  . Encounter for antineoplastic chemotherapy 02/17/2017  . Type 2 diabetes mellitus with nephropathy ([place]) 01/24/2017  . Thrombocytopenia ([place]) 10/21/2016  . Mucosal bleeding 06/19/2016  . Proctitis 05/07/2016  . MDS (myelodysplastic syndrome), high grade ([place]) 04/23/2016  . Oral  mucosal lesion 04/04/2016  . Anemia 02/21/2016  . Pancytopenia ([place]) 02/21/2016  . Tick-borne disease 02/21/2016  . Cough 02/19/2016  . Proteinuria 05/10/2015  . Microscopic hematuria 05/08/2015  . Renal stone 05/08/2015  . Dyslipidemia 05/05/2015  . Arteriosclerosis of coronary artery 02/02/2015  . H/O elevated lipids 02/02/2015  . H/O: HTN (hypertension) 02/02/2015  . Diabetes mellitus, type 2 ([place]) 02/02/2015  . COPD, mild ([place]) 05/02/2014  . Breathlessness on exertion 05/02/2014  . Abnormal respiratory rate 05/02/2014    Past Surgical History:  Procedure Laterality Date  . ABDOMINAL HYSTERECTOMY    . APPENDECTOMY    . CARDIAC CATHETERIZATION     x2  . CHOLECYSTECTOMY    . COLONOSCOPY N/A 02/24/2015   Procedure: COLONOSCOPY;  Surgeon: Manya Silvas, MD;  Location: [facility];  Service: Endoscopy;  Laterality: N/A;  . DIAGNOSTIC LAPAROSCOPY     Removal of benign abdominal tumor  . ESOPHAGOGASTRODUODENOSCOPY N/A 02/24/2015   Procedure: ESOPHAGOGASTRODUODENOSCOPY (EGD);  Surgeon: Manya Silvas, MD;  Location: [facility];  Service: Endoscopy;  Laterality: N/A;  . right eye surgery Right     Prior to Admission medications   Medication Sig Start Date End Date Taking? Authorizing Provider  acyclovir (ZOVIRAX) 400 MG tablet TAKE 1 TABLET(400 MG) BY MOUTH TWICE DAILY 03/24/17   Cammie Sickle, MD  buPROPion (WELLBUTRIN XL) 150 MG 24 hr tablet Take 300 mg by mouth daily at 12 noon.     [provider]  clonazePAM (KLONOPIN) 0.5 MG tablet Take 0.5 mg by mouth 2 (two) times daily.     [provider]  diltiazem (CARDIZEM CD) 180 MG 24 hr capsule Take 1 capsule (180 mg total) by mouth daily. 03/27/17 03/27/18  Darel Hong, MD  hydrocortisone (ANUSOL-HC) 2.5 % rectal cream Place 1 application rectally 2 (two) times daily as needed for hemorrhoids or itching. 05/10/16   Henreitta Leber, MD  lisinopril (PRINIVIL,ZESTRIL) 2.5 MG tablet Take 1 tablet  (2.5 mg total) by mouth daily. 01/24/17   Rochel Brome A, MD  LUMIGAN 0.01 % SOLN Place 1 drop into both eyes at bedtime.  04/12/15   [provider]  magic mouthwash w/lidocaine SOLN Take 5 mLs by mouth 4 (four) times daily. 80 ml viscous lidocaine 2%, 80 ml Mylanta, 80 ml Diphenhydramine 12.5 mg/5 ml Elixir, 80 ml Nystatin 100,000 Unit suspension, 80 ml Prednisolone 15 mg/76ml, 80 ml Distilled Water.  Sig: Swish/Swallow 5-10 ml four times a day as needed. Dispense 480 ml. 3RFs 10/07/16   Cammie Sickle, MD  metFORMIN (GLUCOPHAGE) 500 MG tablet TAKE 1 TABLET BY MOUTH DAILY 12/28/16   Arnetha Courser, MD  Multiple Vitamin (MULTIVITAMIN WITH MINERALS) TABS tablet Take 1 tablet by mouth daily.    [provider]  Omega-3 Fatty Acids (FISH OIL) 1000 MG CAPS Take 1 capsule by mouth daily.    [provider]  ondansetron (ZOFRAN-ODT) 4 MG disintegrating tablet Take 4 mg by mouth every 8 (eight) hours as needed for nausea or vomiting.    [provider]  polyethylene glycol (MIRALAX / GLYCOLAX) packet Take 17 g by mouth daily as needed for mild constipation. 05/10/16   Henreitta Leber, MD  prochlorperazine (COMPAZINE) 10 MG tablet Take 1 tablet (10 mg total) by mouth every 6 (six) hours as needed (Nausea or vomiting). 04/23/16   Cammie Sickle, MD  rosuvastatin (CRESTOR) 20 MG tablet TAKE 1 TABLET(20 MG) BY MOUTH AT BEDTIME 02/19/17   Roselee Nova, MD  sertraline (ZOLOFT) 100 MG tablet Take 100 mg by mouth daily at 12 noon.     [provider]  Umeclidinium-Vilanterol (ANORO ELLIPTA) 62.5-25 MCG/INH AEPB Inhale 1 puff into the lungs daily as needed (shortness of breath).  05/17/14   [provider]    Allergies Macrobid [nitrofurantoin monohyd macro]  Family History  Problem Relation Age of Onset  . Congestive Heart Failure Mother   . Diabetes Mother   . Coronary artery disease Mother   . Stroke Mother   . Cirrhosis Father      Social History Social History  Substance Use Topics  . Smoking status: Former Smoker    Quit date: 09/09/1988  . Smokeless tobacco: Never Used     Comment: quit 25 years ago  . Alcohol use No    Review of Systems Constitutional: No fever/chills Eyes: No visual changes. ENT: No sore throat. Cardiovascular: Positive chest pain. Respiratory: Positive shortness of breath. Gastrointestinal: No abdominal pain.  No nausea, no vomiting.  No diarrhea.  No constipation. Genitourinary: Negative for dysuria. Musculoskeletal: Negative for back pain. Skin: Negative for rash. Neurological: Negative for headaches, focal weakness or numbness.   ____________________________________________   PHYSICAL EXAM:  VITAL SIGNS: ED Triage Vitals [03/27/17 1407]  Enc Vitals Group     BP      Pulse Rate (!) 137     Resp 16     Temp 98.8 F (37.1 C)  Temp Source Oral     SpO2 99 %     Weight 238 lb (108 kg)     Height 5\' 3"  (1.6 m)     Head Circumference      Peak Flow      Pain Score 0     Pain Loc      Pain Edu?      Excl. in [place]?     Constitutional: Alert and oriented 4 smiling laughing and joking well-appearing nontoxic no diaphoresis speaks full clear sentences Eyes: PERRL EOMI. Head: Atraumatic. Nose: No congestion/rhinnorhea. Mouth/Throat: No trismus Neck: No stridor.   Cardiovascular: Irregularly irregular and tachycardic. Grossly normal heart sounds.  Good peripheral circulation. Respiratory: Normal respiratory effort.  No retractions. Lungs CTAB and moving good air Gastrointestinal: Soft nontender Musculoskeletal: No lower extremity edema   Neurologic:  Normal speech and language. No gross focal neurologic deficits are appreciated. Skin:  Skin is warm, dry and intact. No rash noted. Psychiatric: Mood and affect are normal. Speech and behavior are normal.    ____________________________________________   DIFFERENTIAL includes but not limited to  Atrial  fibrillation, atrial flutter, ventricular tachycardia, acute coronary syndrome, hyperthyroidism, dehydration ____________________________________________   LABS (all labs ordered are listed, but only abnormal results are displayed)  Labs Reviewed  BASIC METABOLIC PANEL - Abnormal; Notable for the following:       Result Value   Glucose, Bld 158 (*)    Creatinine, Ser 1.36 (*)    GFR calc non Af Amer 37 (*)    GFR calc Af Amer 43 (*)    All other components within normal limits  CBC - Abnormal; Notable for the following:    WBC 2.8 (*)    RBC 2.80 (*)    Hemoglobin 9.3 (*)    HCT 28.0 (*)    RDW 20.2 (*)    Platelets 87 (*)    All other components within normal limits  TROPONIN I  TSH    Labs unremarkable no signs of acute ischemia her pancytopenia is chronic __________________________________________  EKG  ED ECG REPORT I, Darel Hong, the attending physician, personally viewed and interpreted this ECG.  Date: 03/27/2017 EKG Time: 1409 Rate: 137 Rhythm: Atrial fibrillation with rapid ventricular response QRS Axis: normal Intervals: normal ST/T Wave abnormalities: normal Narrative Interpretation: Abnormal  ____________________________________________  RADIOLOGY  Chest x-ray with no acute disease ____________________________________________   PROCEDURES  Procedure(s) performed: no  Procedures  Critical Care performed: no  Observation: no ____________________________________________   INITIAL IMPRESSION / ASSESSMENT AND PLAN / ED COURSE  Pertinent labs & imaging results that were available during my care of the patient were reviewed by me and considered in my medical decision making (see chart for details).  The patient arrives well-appearing and hemodynamically stable although with new onset atrial fibrillation to the 130s to 140s. I will reach out to her cardiologist for recommendations.     ----------------------------------------- 3:18 PM  on 03/27/2017 -----------------------------------------  I discussed the case with Dr. Josefa Half on call for Dr. Clayborn Bigness who recommended either metoprolol 50 mg twice a day versus extended release diltiazem 180 mg once a day. He indicated that anticoagulation did not need to be started right now and could be discussed as an outpatient.  ____________________________________________  ----------------------------------------- 6:32 PM on 03/27/2017 -----------------------------------------  The patient's heart rate is well controlled and she just now converted back to normal sinus rhythm. She is discharged home with diltiazem and follow up on Monday.  FINAL CLINICAL IMPRESSION(S) /  ED DIAGNOSES  Final diagnoses:  Atrial fibrillation with RVR ([place])      NEW MEDICATIONS STARTED DURING THIS VISIT:  Discharge Medication List as of 03/27/2017  6:27 PM    START taking these medications   Details  diltiazem (CARDIZEM CD) 180 MG 24 hr capsule Take 1 capsule (180 mg total) by mouth daily., Starting Thu 03/27/2017, Until Fri 03/27/2018, Print         Note:  This document was prepared using Dragon voice recognition software and may include unintentional dictation errors.     Darel Hong, MD 03/28/17 [phone]

## 2017-03-27 NOTE — ED Triage Notes (Signed)
Pt to ed with c/o chest pain and mild sob, location in central chest and burning that started around 11 AM today.  Pt states the feeling comes and goes .  Upon arrival to room ekg done and pt found to be in new onset of a fib with AVR 137.  Skin warm and dry.  Pt denies chest pain at this time.

## 2017-03-27 NOTE — Progress Notes (Signed)
FYI- Patient here for Vidaza; day #4/5. However noted to have elevated heart rate 140s; blood pressure 160s. Patient feeling poorly-heart burning. Concerning for A. Fib/RVR. Recommend going to the emergency room. Family updated.

## 2017-03-27 NOTE — ED Notes (Signed)
Pt back from x-ray.

## 2017-03-28 ENCOUNTER — Inpatient Hospital Stay: Payer: Medicare Other

## 2017-03-28 VITALS — BP 134/75 | HR 88 | Temp 98.1°F | Resp 18

## 2017-03-28 DIAGNOSIS — E785 Hyperlipidemia, unspecified: Secondary | ICD-10-CM | POA: Diagnosis not present

## 2017-03-28 DIAGNOSIS — Z7984 Long term (current) use of oral hypoglycemic drugs: Secondary | ICD-10-CM | POA: Diagnosis not present

## 2017-03-28 DIAGNOSIS — D4622 Refractory anemia with excess of blasts 2: Secondary | ICD-10-CM | POA: Diagnosis not present

## 2017-03-28 DIAGNOSIS — D46Z Other myelodysplastic syndromes: Secondary | ICD-10-CM

## 2017-03-28 DIAGNOSIS — Z87891 Personal history of nicotine dependence: Secondary | ICD-10-CM | POA: Diagnosis not present

## 2017-03-28 DIAGNOSIS — R231 Pallor: Secondary | ICD-10-CM | POA: Diagnosis not present

## 2017-03-28 DIAGNOSIS — Z87442 Personal history of urinary calculi: Secondary | ICD-10-CM | POA: Diagnosis not present

## 2017-03-28 DIAGNOSIS — I129 Hypertensive chronic kidney disease with stage 1 through stage 4 chronic kidney disease, or unspecified chronic kidney disease: Secondary | ICD-10-CM | POA: Diagnosis not present

## 2017-03-28 DIAGNOSIS — Z8719 Personal history of other diseases of the digestive system: Secondary | ICD-10-CM | POA: Diagnosis not present

## 2017-03-28 DIAGNOSIS — N183 Chronic kidney disease, stage 3 (moderate): Secondary | ICD-10-CM | POA: Diagnosis not present

## 2017-03-28 DIAGNOSIS — D696 Thrombocytopenia, unspecified: Secondary | ICD-10-CM | POA: Diagnosis not present

## 2017-03-28 DIAGNOSIS — K219 Gastro-esophageal reflux disease without esophagitis: Secondary | ICD-10-CM | POA: Diagnosis not present

## 2017-03-28 DIAGNOSIS — Z79899 Other long term (current) drug therapy: Secondary | ICD-10-CM | POA: Diagnosis not present

## 2017-03-28 DIAGNOSIS — Z792 Long term (current) use of antibiotics: Secondary | ICD-10-CM | POA: Diagnosis not present

## 2017-03-28 DIAGNOSIS — E1122 Type 2 diabetes mellitus with diabetic chronic kidney disease: Secondary | ICD-10-CM | POA: Diagnosis not present

## 2017-03-28 DIAGNOSIS — R5383 Other fatigue: Secondary | ICD-10-CM | POA: Diagnosis not present

## 2017-03-28 MED ORDER — ONDANSETRON HCL 4 MG PO TABS
8.0000 mg | ORAL_TABLET | Freq: Once | ORAL | Status: AC
  Administered 2017-03-28: 8 mg via ORAL
  Filled 2017-03-28: qty 2

## 2017-03-28 MED ORDER — AZACITIDINE CHEMO SQ INJECTION
130.0000 mg | Freq: Once | INTRAMUSCULAR | Status: AC
  Administered 2017-03-28: 130 mg via SUBCUTANEOUS
  Filled 2017-03-28: qty 5.2

## 2017-03-31 ENCOUNTER — Inpatient Hospital Stay: Payer: Medicare Other

## 2017-03-31 DIAGNOSIS — R5383 Other fatigue: Secondary | ICD-10-CM | POA: Diagnosis not present

## 2017-03-31 DIAGNOSIS — R231 Pallor: Secondary | ICD-10-CM | POA: Diagnosis not present

## 2017-03-31 DIAGNOSIS — Z79899 Other long term (current) drug therapy: Secondary | ICD-10-CM | POA: Diagnosis not present

## 2017-03-31 DIAGNOSIS — D4622 Refractory anemia with excess of blasts 2: Secondary | ICD-10-CM | POA: Diagnosis not present

## 2017-03-31 DIAGNOSIS — N183 Chronic kidney disease, stage 3 (moderate): Secondary | ICD-10-CM | POA: Diagnosis not present

## 2017-03-31 DIAGNOSIS — I129 Hypertensive chronic kidney disease with stage 1 through stage 4 chronic kidney disease, or unspecified chronic kidney disease: Secondary | ICD-10-CM | POA: Diagnosis not present

## 2017-03-31 DIAGNOSIS — Z792 Long term (current) use of antibiotics: Secondary | ICD-10-CM | POA: Diagnosis not present

## 2017-03-31 DIAGNOSIS — D696 Thrombocytopenia, unspecified: Secondary | ICD-10-CM | POA: Diagnosis not present

## 2017-03-31 DIAGNOSIS — Z87891 Personal history of nicotine dependence: Secondary | ICD-10-CM | POA: Diagnosis not present

## 2017-03-31 DIAGNOSIS — D46Z Other myelodysplastic syndromes: Secondary | ICD-10-CM

## 2017-03-31 DIAGNOSIS — E785 Hyperlipidemia, unspecified: Secondary | ICD-10-CM | POA: Diagnosis not present

## 2017-03-31 DIAGNOSIS — K219 Gastro-esophageal reflux disease without esophagitis: Secondary | ICD-10-CM | POA: Diagnosis not present

## 2017-03-31 DIAGNOSIS — E1122 Type 2 diabetes mellitus with diabetic chronic kidney disease: Secondary | ICD-10-CM | POA: Diagnosis not present

## 2017-03-31 DIAGNOSIS — Z7984 Long term (current) use of oral hypoglycemic drugs: Secondary | ICD-10-CM | POA: Diagnosis not present

## 2017-03-31 DIAGNOSIS — Z87442 Personal history of urinary calculi: Secondary | ICD-10-CM | POA: Diagnosis not present

## 2017-03-31 DIAGNOSIS — Z8719 Personal history of other diseases of the digestive system: Secondary | ICD-10-CM | POA: Diagnosis not present

## 2017-03-31 LAB — COMPREHENSIVE METABOLIC PANEL
ALBUMIN: 3.7 g/dL (ref 3.5–5.0)
ALK PHOS: 70 U/L (ref 38–126)
ALT: 14 U/L (ref 14–54)
ANION GAP: 4 — AB (ref 5–15)
AST: 16 U/L (ref 15–41)
BILIRUBIN TOTAL: 0.5 mg/dL (ref 0.3–1.2)
BUN: 20 mg/dL (ref 6–20)
CALCIUM: 9.3 mg/dL (ref 8.9–10.3)
CO2: 28 mmol/L (ref 22–32)
Chloride: 107 mmol/L (ref 101–111)
Creatinine, Ser: 1.08 mg/dL — ABNORMAL HIGH (ref 0.44–1.00)
GFR calc Af Amer: 57 mL/min — ABNORMAL LOW (ref >60)
GFR, EST NON AFRICAN AMERICAN: 49 mL/min — AB (ref >60)
GLUCOSE: 115 mg/dL — AB (ref 65–99)
Potassium: 4.8 mmol/L (ref 3.5–5.1)
Sodium: 139 mmol/L (ref 135–145)
TOTAL PROTEIN: 7.3 g/dL (ref 6.5–8.1)

## 2017-03-31 LAB — CBC WITH DIFFERENTIAL/PLATELET
Basophils Absolute: 0 10*3/uL (ref 0–0.1)
Basophils Relative: 1 %
EOS ABS: 0 10*3/uL (ref 0–0.7)
EOS PCT: 0 %
HCT: 23.9 % — ABNORMAL LOW (ref 35.0–47.0)
Hemoglobin: 8.1 g/dL — ABNORMAL LOW (ref 12.0–16.0)
LYMPHS PCT: 46 %
Lymphs Abs: 0.9 10*3/uL — ABNORMAL LOW (ref 1.0–3.6)
MCH: 33.6 pg (ref 26.0–34.0)
MCHC: 33.8 g/dL (ref 32.0–36.0)
MCV: 99.3 fL (ref 80.0–100.0)
MONOS PCT: 6 %
Monocytes Absolute: 0.1 10*3/uL — ABNORMAL LOW (ref 0.2–0.9)
Neutro Abs: 1 10*3/uL — ABNORMAL LOW (ref 1.4–6.5)
Neutrophils Relative %: 47 %
PLATELETS: 110 10*3/uL — AB (ref 150–440)
RBC: 2.41 MIL/uL — AB (ref 3.80–5.20)
RDW: 19.1 % — ABNORMAL HIGH (ref 11.5–14.5)
WBC: 2 10*3/uL — AB (ref 3.6–11.0)

## 2017-03-31 LAB — SAMPLE TO BLOOD BANK

## 2017-04-02 DIAGNOSIS — I480 Paroxysmal atrial fibrillation: Secondary | ICD-10-CM | POA: Diagnosis not present

## 2017-04-02 DIAGNOSIS — R0602 Shortness of breath: Secondary | ICD-10-CM | POA: Diagnosis not present

## 2017-04-02 DIAGNOSIS — I4891 Unspecified atrial fibrillation: Secondary | ICD-10-CM | POA: Diagnosis not present

## 2017-04-02 DIAGNOSIS — E119 Type 2 diabetes mellitus without complications: Secondary | ICD-10-CM | POA: Diagnosis not present

## 2017-04-07 ENCOUNTER — Other Ambulatory Visit: Payer: Self-pay

## 2017-04-07 ENCOUNTER — Inpatient Hospital Stay: Payer: Medicare Other

## 2017-04-07 DIAGNOSIS — Z79899 Other long term (current) drug therapy: Secondary | ICD-10-CM | POA: Diagnosis not present

## 2017-04-07 DIAGNOSIS — R231 Pallor: Secondary | ICD-10-CM | POA: Diagnosis not present

## 2017-04-07 DIAGNOSIS — D696 Thrombocytopenia, unspecified: Secondary | ICD-10-CM | POA: Diagnosis not present

## 2017-04-07 DIAGNOSIS — I129 Hypertensive chronic kidney disease with stage 1 through stage 4 chronic kidney disease, or unspecified chronic kidney disease: Secondary | ICD-10-CM | POA: Diagnosis not present

## 2017-04-07 DIAGNOSIS — G4733 Obstructive sleep apnea (adult) (pediatric): Secondary | ICD-10-CM | POA: Diagnosis not present

## 2017-04-07 DIAGNOSIS — N183 Chronic kidney disease, stage 3 (moderate): Secondary | ICD-10-CM | POA: Diagnosis not present

## 2017-04-07 DIAGNOSIS — Z792 Long term (current) use of antibiotics: Secondary | ICD-10-CM | POA: Diagnosis not present

## 2017-04-07 DIAGNOSIS — Z87891 Personal history of nicotine dependence: Secondary | ICD-10-CM | POA: Diagnosis not present

## 2017-04-07 DIAGNOSIS — D4622 Refractory anemia with excess of blasts 2: Secondary | ICD-10-CM | POA: Diagnosis not present

## 2017-04-07 DIAGNOSIS — D46Z Other myelodysplastic syndromes: Secondary | ICD-10-CM

## 2017-04-07 DIAGNOSIS — Z7984 Long term (current) use of oral hypoglycemic drugs: Secondary | ICD-10-CM | POA: Diagnosis not present

## 2017-04-07 DIAGNOSIS — K219 Gastro-esophageal reflux disease without esophagitis: Secondary | ICD-10-CM | POA: Diagnosis not present

## 2017-04-07 DIAGNOSIS — Z8719 Personal history of other diseases of the digestive system: Secondary | ICD-10-CM | POA: Diagnosis not present

## 2017-04-07 DIAGNOSIS — E1122 Type 2 diabetes mellitus with diabetic chronic kidney disease: Secondary | ICD-10-CM | POA: Diagnosis not present

## 2017-04-07 DIAGNOSIS — E785 Hyperlipidemia, unspecified: Secondary | ICD-10-CM | POA: Diagnosis not present

## 2017-04-07 DIAGNOSIS — R5383 Other fatigue: Secondary | ICD-10-CM | POA: Diagnosis not present

## 2017-04-07 DIAGNOSIS — Z87442 Personal history of urinary calculi: Secondary | ICD-10-CM | POA: Diagnosis not present

## 2017-04-07 LAB — COMPREHENSIVE METABOLIC PANEL
ALBUMIN: 3.9 g/dL (ref 3.5–5.0)
ALT: 18 U/L (ref 14–54)
ANION GAP: 8 (ref 5–15)
AST: 22 U/L (ref 15–41)
Alkaline Phosphatase: 76 U/L (ref 38–126)
BUN: 24 mg/dL — AB (ref 6–20)
CHLORIDE: 106 mmol/L (ref 101–111)
CO2: 25 mmol/L (ref 22–32)
Calcium: 9.2 mg/dL (ref 8.9–10.3)
Creatinine, Ser: 1.26 mg/dL — ABNORMAL HIGH (ref 0.44–1.00)
GFR calc Af Amer: 47 mL/min — ABNORMAL LOW (ref >60)
GFR calc non Af Amer: 41 mL/min — ABNORMAL LOW (ref >60)
GLUCOSE: 131 mg/dL — AB (ref 65–99)
POTASSIUM: 4.8 mmol/L (ref 3.5–5.1)
SODIUM: 139 mmol/L (ref 135–145)
Total Bilirubin: 0.4 mg/dL (ref 0.3–1.2)
Total Protein: 7.3 g/dL (ref 6.5–8.1)

## 2017-04-07 LAB — CBC WITH DIFFERENTIAL/PLATELET
BASOS ABS: 0 10*3/uL (ref 0–0.1)
BASOS PCT: 1 %
EOS ABS: 0 10*3/uL (ref 0–0.7)
Eosinophils Relative: 0 %
HCT: 23.7 % — ABNORMAL LOW (ref 35.0–47.0)
HEMOGLOBIN: 8.1 g/dL — AB (ref 12.0–16.0)
LYMPHS ABS: 1.2 10*3/uL (ref 1.0–3.6)
Lymphocytes Relative: 38 %
MCH: 33.6 pg (ref 26.0–34.0)
MCHC: 34 g/dL (ref 32.0–36.0)
MCV: 98.8 fL (ref 80.0–100.0)
Monocytes Absolute: 0.1 10*3/uL — ABNORMAL LOW (ref 0.2–0.9)
Monocytes Relative: 4 %
NEUTROS PCT: 57 %
Neutro Abs: 1.7 10*3/uL (ref 1.4–6.5)
PLATELETS: 53 10*3/uL — AB (ref 150–440)
RBC: 2.4 MIL/uL — AB (ref 3.80–5.20)
RDW: 19.4 % — ABNORMAL HIGH (ref 11.5–14.5)
WBC: 3 10*3/uL — AB (ref 3.6–11.0)

## 2017-04-07 LAB — SAMPLE TO BLOOD BANK

## 2017-04-09 DIAGNOSIS — N183 Chronic kidney disease, stage 3 (moderate): Secondary | ICD-10-CM | POA: Diagnosis not present

## 2017-04-09 DIAGNOSIS — R319 Hematuria, unspecified: Secondary | ICD-10-CM | POA: Diagnosis not present

## 2017-04-09 DIAGNOSIS — E1122 Type 2 diabetes mellitus with diabetic chronic kidney disease: Secondary | ICD-10-CM | POA: Diagnosis not present

## 2017-04-09 DIAGNOSIS — I10 Essential (primary) hypertension: Secondary | ICD-10-CM | POA: Diagnosis not present

## 2017-04-09 DIAGNOSIS — E785 Hyperlipidemia, unspecified: Secondary | ICD-10-CM | POA: Diagnosis not present

## 2017-04-11 DIAGNOSIS — H40033 Anatomical narrow angle, bilateral: Secondary | ICD-10-CM | POA: Diagnosis not present

## 2017-04-11 LAB — HM DIABETES EYE EXAM

## 2017-04-14 ENCOUNTER — Inpatient Hospital Stay: Payer: Medicare Other | Attending: Internal Medicine

## 2017-04-14 ENCOUNTER — Other Ambulatory Visit: Payer: Self-pay | Admitting: *Deleted

## 2017-04-14 ENCOUNTER — Telehealth: Payer: Self-pay | Admitting: *Deleted

## 2017-04-14 ENCOUNTER — Inpatient Hospital Stay: Payer: Medicare Other

## 2017-04-14 DIAGNOSIS — Z7984 Long term (current) use of oral hypoglycemic drugs: Secondary | ICD-10-CM | POA: Diagnosis not present

## 2017-04-14 DIAGNOSIS — K068 Other specified disorders of gingiva and edentulous alveolar ridge: Secondary | ICD-10-CM | POA: Insufficient documentation

## 2017-04-14 DIAGNOSIS — D46Z Other myelodysplastic syndromes: Secondary | ICD-10-CM

## 2017-04-14 DIAGNOSIS — F418 Other specified anxiety disorders: Secondary | ICD-10-CM | POA: Insufficient documentation

## 2017-04-14 DIAGNOSIS — D4622 Refractory anemia with excess of blasts 2: Secondary | ICD-10-CM | POA: Diagnosis not present

## 2017-04-14 DIAGNOSIS — E1122 Type 2 diabetes mellitus with diabetic chronic kidney disease: Secondary | ICD-10-CM | POA: Diagnosis not present

## 2017-04-14 DIAGNOSIS — I129 Hypertensive chronic kidney disease with stage 1 through stage 4 chronic kidney disease, or unspecified chronic kidney disease: Secondary | ICD-10-CM | POA: Insufficient documentation

## 2017-04-14 DIAGNOSIS — R5382 Chronic fatigue, unspecified: Secondary | ICD-10-CM | POA: Insufficient documentation

## 2017-04-14 DIAGNOSIS — Z87891 Personal history of nicotine dependence: Secondary | ICD-10-CM | POA: Insufficient documentation

## 2017-04-14 DIAGNOSIS — N183 Chronic kidney disease, stage 3 (moderate): Secondary | ICD-10-CM | POA: Insufficient documentation

## 2017-04-14 DIAGNOSIS — Z792 Long term (current) use of antibiotics: Secondary | ICD-10-CM | POA: Diagnosis not present

## 2017-04-14 DIAGNOSIS — D696 Thrombocytopenia, unspecified: Secondary | ICD-10-CM

## 2017-04-14 DIAGNOSIS — E785 Hyperlipidemia, unspecified: Secondary | ICD-10-CM | POA: Diagnosis not present

## 2017-04-14 DIAGNOSIS — Z79899 Other long term (current) drug therapy: Secondary | ICD-10-CM | POA: Diagnosis not present

## 2017-04-14 DIAGNOSIS — Z881 Allergy status to other antibiotic agents status: Secondary | ICD-10-CM | POA: Diagnosis not present

## 2017-04-14 DIAGNOSIS — R231 Pallor: Secondary | ICD-10-CM | POA: Insufficient documentation

## 2017-04-14 DIAGNOSIS — E669 Obesity, unspecified: Secondary | ICD-10-CM | POA: Insufficient documentation

## 2017-04-14 LAB — CBC WITH DIFFERENTIAL/PLATELET
BASOS ABS: 0 10*3/uL (ref 0–0.1)
BASOS PCT: 1 %
EOS ABS: 0 10*3/uL (ref 0–0.7)
EOS PCT: 0 %
HCT: 23.3 % — ABNORMAL LOW (ref 35.0–47.0)
Hemoglobin: 8 g/dL — ABNORMAL LOW (ref 12.0–16.0)
LYMPHS PCT: 43 %
Lymphs Abs: 1.3 10*3/uL (ref 1.0–3.6)
MCH: 34.2 pg — ABNORMAL HIGH (ref 26.0–34.0)
MCHC: 34.2 g/dL (ref 32.0–36.0)
MCV: 100.1 fL — AB (ref 80.0–100.0)
Monocytes Absolute: 0.2 10*3/uL (ref 0.2–0.9)
Monocytes Relative: 5 %
Neutro Abs: 1.5 10*3/uL (ref 1.4–6.5)
Neutrophils Relative %: 51 %
PLATELETS: 22 10*3/uL — AB (ref 150–400)
RBC: 2.33 MIL/uL — AB (ref 3.80–5.20)
RDW: 19.6 % — ABNORMAL HIGH (ref 11.5–14.5)
WBC: 3 10*3/uL — AB (ref 3.6–11.0)

## 2017-04-14 LAB — COMPREHENSIVE METABOLIC PANEL
ALBUMIN: 3.8 g/dL (ref 3.5–5.0)
ALT: 15 U/L (ref 14–54)
ANION GAP: 6 (ref 5–15)
AST: 16 U/L (ref 15–41)
Alkaline Phosphatase: 78 U/L (ref 38–126)
BUN: 23 mg/dL — ABNORMAL HIGH (ref 6–20)
CHLORIDE: 105 mmol/L (ref 101–111)
CO2: 27 mmol/L (ref 22–32)
Calcium: 9.1 mg/dL (ref 8.9–10.3)
Creatinine, Ser: 1.11 mg/dL — ABNORMAL HIGH (ref 0.44–1.00)
GFR calc Af Amer: 55 mL/min — ABNORMAL LOW (ref >60)
GFR calc non Af Amer: 47 mL/min — ABNORMAL LOW (ref >60)
GLUCOSE: 108 mg/dL — AB (ref 65–99)
POTASSIUM: 4.5 mmol/L (ref 3.5–5.1)
SODIUM: 138 mmol/L (ref 135–145)
Total Bilirubin: 0.5 mg/dL (ref 0.3–1.2)
Total Protein: 7.4 g/dL (ref 6.5–8.1)

## 2017-04-14 LAB — SAMPLE TO BLOOD BANK

## 2017-04-14 MED ORDER — SODIUM CHLORIDE 0.9 % IV SOLN
250.0000 mL | Freq: Once | INTRAVENOUS | Status: AC
  Administered 2017-04-14: 250 mL via INTRAVENOUS
  Filled 2017-04-14: qty 250

## 2017-04-14 MED ORDER — ACETAMINOPHEN 325 MG PO TABS
650.0000 mg | ORAL_TABLET | Freq: Once | ORAL | Status: AC
  Administered 2017-04-14: 650 mg via ORAL
  Filled 2017-04-14: qty 2

## 2017-04-14 MED ORDER — DIPHENHYDRAMINE HCL 25 MG PO CAPS
25.0000 mg | ORAL_CAPSULE | Freq: Once | ORAL | Status: AC
  Administered 2017-04-14: 25 mg via ORAL
  Filled 2017-04-14: qty 1

## 2017-04-14 NOTE — Telephone Encounter (Signed)
Call from [facility] - Doni. Read back process performed at 11:46 - call report estimated plt count 13. Spoke with patient. Per RN - pt is symptomatic. She reports bruising. I personally went to the lab to evaluate the patient. She explained that she is bleeding from her gums/generalized bruising on forearms.  plts were ordered and anticipated to arrive around 2pm.  Hand off provided to [facility] in infusion.  I received a secondary report at 1237 from [place], [place] after a manual plt count. plts are 22. Read back process performed today with Doni, RN.  Per Dr. Rogue Bussing - due to pt symptoms, he will not change the plan to infuse plts.

## 2017-04-15 DIAGNOSIS — I4891 Unspecified atrial fibrillation: Secondary | ICD-10-CM | POA: Diagnosis not present

## 2017-04-15 DIAGNOSIS — R0602 Shortness of breath: Secondary | ICD-10-CM | POA: Diagnosis not present

## 2017-04-15 LAB — PREPARE PLATELET PHERESIS: UNIT DIVISION: 0

## 2017-04-15 LAB — BPAM PLATELET PHERESIS
Blood Product Expiration Date: 201808092359
ISSUE DATE / TIME: 201808061413
Unit Type and Rh: 6200

## 2017-04-17 ENCOUNTER — Encounter: Payer: Self-pay | Admitting: Family Medicine

## 2017-04-21 ENCOUNTER — Inpatient Hospital Stay: Payer: Medicare Other

## 2017-04-21 ENCOUNTER — Ambulatory Visit
Admission: RE | Admit: 2017-04-21 | Discharge: 2017-04-21 | Disposition: A | Discharge status: Home / Self Care | Payer: Medicare Other | Source: Ambulatory Visit | Attending: Internal Medicine | Admitting: Internal Medicine

## 2017-04-21 ENCOUNTER — Other Ambulatory Visit: Payer: Self-pay | Admitting: Internal Medicine

## 2017-04-21 DIAGNOSIS — R231 Pallor: Secondary | ICD-10-CM | POA: Diagnosis not present

## 2017-04-21 DIAGNOSIS — E1122 Type 2 diabetes mellitus with diabetic chronic kidney disease: Secondary | ICD-10-CM | POA: Diagnosis not present

## 2017-04-21 DIAGNOSIS — D4622 Refractory anemia with excess of blasts 2: Secondary | ICD-10-CM | POA: Diagnosis not present

## 2017-04-21 DIAGNOSIS — Z87891 Personal history of nicotine dependence: Secondary | ICD-10-CM | POA: Diagnosis not present

## 2017-04-21 DIAGNOSIS — I129 Hypertensive chronic kidney disease with stage 1 through stage 4 chronic kidney disease, or unspecified chronic kidney disease: Secondary | ICD-10-CM | POA: Diagnosis not present

## 2017-04-21 DIAGNOSIS — Z7984 Long term (current) use of oral hypoglycemic drugs: Secondary | ICD-10-CM | POA: Diagnosis not present

## 2017-04-21 DIAGNOSIS — D46Z Other myelodysplastic syndromes: Secondary | ICD-10-CM

## 2017-04-21 DIAGNOSIS — Z792 Long term (current) use of antibiotics: Secondary | ICD-10-CM | POA: Diagnosis not present

## 2017-04-21 DIAGNOSIS — Z881 Allergy status to other antibiotic agents status: Secondary | ICD-10-CM | POA: Diagnosis not present

## 2017-04-21 DIAGNOSIS — D649 Anemia, unspecified: Secondary | ICD-10-CM

## 2017-04-21 DIAGNOSIS — N183 Chronic kidney disease, stage 3 (moderate): Secondary | ICD-10-CM | POA: Diagnosis not present

## 2017-04-21 DIAGNOSIS — E785 Hyperlipidemia, unspecified: Secondary | ICD-10-CM | POA: Diagnosis not present

## 2017-04-21 DIAGNOSIS — D696 Thrombocytopenia, unspecified: Secondary | ICD-10-CM | POA: Diagnosis not present

## 2017-04-21 DIAGNOSIS — R5382 Chronic fatigue, unspecified: Secondary | ICD-10-CM | POA: Diagnosis not present

## 2017-04-21 DIAGNOSIS — Z79899 Other long term (current) drug therapy: Secondary | ICD-10-CM | POA: Diagnosis not present

## 2017-04-21 DIAGNOSIS — K068 Other specified disorders of gingiva and edentulous alveolar ridge: Secondary | ICD-10-CM | POA: Diagnosis not present

## 2017-04-21 LAB — COMPREHENSIVE METABOLIC PANEL
ALBUMIN: 3.8 g/dL (ref 3.5–5.0)
ALK PHOS: 67 U/L (ref 38–126)
ALT: 12 U/L — ABNORMAL LOW (ref 14–54)
AST: 18 U/L (ref 15–41)
Anion gap: 6 (ref 5–15)
BILIRUBIN TOTAL: 0.5 mg/dL (ref 0.3–1.2)
BUN: 19 mg/dL (ref 6–20)
CALCIUM: 9.1 mg/dL (ref 8.9–10.3)
CO2: 27 mmol/L (ref 22–32)
Chloride: 109 mmol/L (ref 101–111)
Creatinine, Ser: 1.19 mg/dL — ABNORMAL HIGH (ref 0.44–1.00)
GFR calc Af Amer: 50 mL/min — ABNORMAL LOW (ref >60)
GFR calc non Af Amer: 44 mL/min — ABNORMAL LOW (ref >60)
GLUCOSE: 140 mg/dL — AB (ref 65–99)
Potassium: 4.6 mmol/L (ref 3.5–5.1)
SODIUM: 142 mmol/L (ref 135–145)
TOTAL PROTEIN: 7.1 g/dL (ref 6.5–8.1)

## 2017-04-21 LAB — CBC WITH DIFFERENTIAL/PLATELET
BASOS ABS: 0 10*3/uL (ref 0–0.1)
Basophils Relative: 0 %
EOS PCT: 0 %
Eosinophils Absolute: 0 10*3/uL (ref 0–0.7)
HEMATOCRIT: 22.8 % — AB (ref 35.0–47.0)
HEMOGLOBIN: 7.7 g/dL — AB (ref 12.0–16.0)
LYMPHS ABS: 1 10*3/uL (ref 1.0–3.6)
LYMPHS PCT: 41 %
MCH: 33.9 pg (ref 26.0–34.0)
MCHC: 33.7 g/dL (ref 32.0–36.0)
MCV: 100.5 fL — AB (ref 80.0–100.0)
Monocytes Absolute: 0.1 10*3/uL — ABNORMAL LOW (ref 0.2–0.9)
Monocytes Relative: 5 %
NEUTROS ABS: 1.3 10*3/uL — AB (ref 1.4–6.5)
NEUTROS PCT: 54 %
Platelets: 72 10*3/uL — ABNORMAL LOW (ref 150–440)
RBC: 2.27 MIL/uL — AB (ref 3.80–5.20)
RDW: 19.9 % — ABNORMAL HIGH (ref 11.5–14.5)
WBC: 2.4 10*3/uL — AB (ref 3.6–11.0)

## 2017-04-21 LAB — PREPARE RBC (CROSSMATCH)

## 2017-04-21 LAB — SAMPLE TO BLOOD BANK

## 2017-04-21 MED ORDER — ACETAMINOPHEN 325 MG PO TABS: 650.0000 mg | ORAL_TABLET | Freq: Once | ORAL | Status: DC

## 2017-04-21 MED ORDER — SODIUM CHLORIDE 0.9 % IV SOLN: 250.0000 mL | Freq: Once | INTRAVENOUS | Status: DC

## 2017-04-21 MED ORDER — SODIUM CHLORIDE 0.9% FLUSH: 3.0000 mL | INTRAVENOUS | Status: DC | PRN

## 2017-04-21 MED ORDER — DIPHENHYDRAMINE HCL 25 MG PO CAPS: 25.0000 mg | ORAL_CAPSULE | Freq: Once | ORAL | Status: DC

## 2017-04-21 MED ORDER — SODIUM CHLORIDE 0.9% FLUSH: 10.0000 mL | INTRAVENOUS | Status: DC | PRN

## 2017-04-22 LAB — TYPE AND SCREEN
ABO/RH(D): A POS
ANTIBODY SCREEN: NEGATIVE
Unit division: 0

## 2017-04-22 LAB — BPAM RBC
Blood Product Expiration Date: 201808292359
ISSUE DATE / TIME: 201808131401
Unit Type and Rh: 6200

## 2017-04-25 ENCOUNTER — Other Ambulatory Visit: Payer: Self-pay | Admitting: Family Medicine

## 2017-04-25 DIAGNOSIS — E1121 Type 2 diabetes mellitus with diabetic nephropathy: Secondary | ICD-10-CM

## 2017-04-28 ENCOUNTER — Inpatient Hospital Stay: Payer: Medicare Other

## 2017-04-28 ENCOUNTER — Inpatient Hospital Stay (HOSPITAL_BASED_OUTPATIENT_CLINIC_OR_DEPARTMENT_OTHER): Payer: Medicare Other | Admitting: Internal Medicine

## 2017-04-28 VITALS — BP 133/63 | HR 67 | Temp 97.8°F | Resp 18 | Wt 243.0 lb

## 2017-04-28 DIAGNOSIS — E1122 Type 2 diabetes mellitus with diabetic chronic kidney disease: Secondary | ICD-10-CM | POA: Diagnosis not present

## 2017-04-28 DIAGNOSIS — Z87891 Personal history of nicotine dependence: Secondary | ICD-10-CM | POA: Diagnosis not present

## 2017-04-28 DIAGNOSIS — D46Z Other myelodysplastic syndromes: Secondary | ICD-10-CM

## 2017-04-28 DIAGNOSIS — I129 Hypertensive chronic kidney disease with stage 1 through stage 4 chronic kidney disease, or unspecified chronic kidney disease: Secondary | ICD-10-CM | POA: Diagnosis not present

## 2017-04-28 DIAGNOSIS — Z881 Allergy status to other antibiotic agents status: Secondary | ICD-10-CM

## 2017-04-28 DIAGNOSIS — Z7984 Long term (current) use of oral hypoglycemic drugs: Secondary | ICD-10-CM | POA: Diagnosis not present

## 2017-04-28 DIAGNOSIS — R5382 Chronic fatigue, unspecified: Secondary | ICD-10-CM | POA: Diagnosis not present

## 2017-04-28 DIAGNOSIS — K068 Other specified disorders of gingiva and edentulous alveolar ridge: Secondary | ICD-10-CM

## 2017-04-28 DIAGNOSIS — N183 Chronic kidney disease, stage 3 (moderate): Secondary | ICD-10-CM | POA: Diagnosis not present

## 2017-04-28 DIAGNOSIS — Z79899 Other long term (current) drug therapy: Secondary | ICD-10-CM

## 2017-04-28 DIAGNOSIS — E785 Hyperlipidemia, unspecified: Secondary | ICD-10-CM | POA: Diagnosis not present

## 2017-04-28 DIAGNOSIS — E669 Obesity, unspecified: Secondary | ICD-10-CM

## 2017-04-28 DIAGNOSIS — D696 Thrombocytopenia, unspecified: Secondary | ICD-10-CM | POA: Diagnosis not present

## 2017-04-28 DIAGNOSIS — R231 Pallor: Secondary | ICD-10-CM

## 2017-04-28 DIAGNOSIS — D4622 Refractory anemia with excess of blasts 2: Secondary | ICD-10-CM | POA: Diagnosis not present

## 2017-04-28 DIAGNOSIS — Z792 Long term (current) use of antibiotics: Secondary | ICD-10-CM

## 2017-04-28 DIAGNOSIS — F418 Other specified anxiety disorders: Secondary | ICD-10-CM

## 2017-04-28 LAB — CBC WITH DIFFERENTIAL/PLATELET
BASOS ABS: 0 10*3/uL (ref 0–0.1)
BASOS PCT: 0 %
EOS ABS: 0 10*3/uL (ref 0–0.7)
EOS PCT: 0 %
HCT: 26 % — ABNORMAL LOW (ref 35.0–47.0)
Hemoglobin: 8.9 g/dL — ABNORMAL LOW (ref 12.0–16.0)
Lymphocytes Relative: 43 %
Lymphs Abs: 1.1 10*3/uL (ref 1.0–3.6)
MCH: 33.5 pg (ref 26.0–34.0)
MCHC: 34.1 g/dL (ref 32.0–36.0)
MCV: 98.1 fL (ref 80.0–100.0)
MONO ABS: 0.1 10*3/uL — AB (ref 0.2–0.9)
Monocytes Relative: 4 %
NEUTROS PCT: 53 %
Neutro Abs: 1.3 10*3/uL — ABNORMAL LOW (ref 1.4–6.5)
PLATELETS: 81 10*3/uL — AB (ref 150–440)
RBC: 2.65 MIL/uL — ABNORMAL LOW (ref 3.80–5.20)
RDW: 19.5 % — AB (ref 11.5–14.5)
WBC: 2.6 10*3/uL — ABNORMAL LOW (ref 3.6–11.0)

## 2017-04-28 LAB — COMPREHENSIVE METABOLIC PANEL
ALK PHOS: 70 U/L (ref 38–126)
ALT: 16 U/L (ref 14–54)
ANION GAP: 8 (ref 5–15)
AST: 18 U/L (ref 15–41)
Albumin: 3.9 g/dL (ref 3.5–5.0)
BUN: 26 mg/dL — ABNORMAL HIGH (ref 6–20)
CALCIUM: 9.2 mg/dL (ref 8.9–10.3)
CHLORIDE: 110 mmol/L (ref 101–111)
CO2: 25 mmol/L (ref 22–32)
Creatinine, Ser: 1.41 mg/dL — ABNORMAL HIGH (ref 0.44–1.00)
GFR calc non Af Amer: 35 mL/min — ABNORMAL LOW (ref >60)
GFR, EST AFRICAN AMERICAN: 41 mL/min — AB (ref >60)
Glucose, Bld: 130 mg/dL — ABNORMAL HIGH (ref 65–99)
Potassium: 4.9 mmol/L (ref 3.5–5.1)
SODIUM: 143 mmol/L (ref 135–145)
Total Bilirubin: 0.5 mg/dL (ref 0.3–1.2)
Total Protein: 7.5 g/dL (ref 6.5–8.1)

## 2017-04-28 LAB — SAMPLE TO BLOOD BANK

## 2017-04-28 MED ORDER — AZACITIDINE CHEMO SQ INJECTION
130.0000 mg | Freq: Once | INTRAMUSCULAR | Status: AC
  Administered 2017-04-28: 130 mg via SUBCUTANEOUS
  Filled 2017-04-28: qty 5.2

## 2017-04-28 MED ORDER — ONDANSETRON HCL 4 MG PO TABS
8.0000 mg | ORAL_TABLET | Freq: Once | ORAL | Status: AC
  Administered 2017-04-28: 8 mg via ORAL
  Filled 2017-04-28: qty 2

## 2017-04-28 NOTE — Assessment & Plan Note (Addendum)
Refractory anemia- with excess blasts- II [blasts percentage 14%] FISH- normal karyotype. On vidaza; s/p  cycle # 8; on April 23rd 2018- BMX- Partial response noted [persistent high grade MDS; aspirate -sub-optimal for blasts evaluation; 90% hyper-cellular bone marrow; dysplastic changes].   # proceed with treatment #12 today; platelets- 81; Labs today reviewed; hemoglobin 8.9. [C discussion below]  # Thrombocytopenia/anemia- likely combination of underlying MDS and secondary to Vidaza-  Continue treatments every 5 weeks [currently reduced by 20% and also 5 treatment each cycle]. Will plan BMBX after this cycle.   # Gingival inflammation/# Gum bleeding- continue salt/baking soda rinses.  recommend STOPPING asprin.   #CKD stage III-stable.  # Prophylactic antibiotics- acyclovir/ diflucan.   # follow up in 5 weeks sep 24th/ labs weekly; BMBx-in 4 weeks.  

## 2017-04-28 NOTE — Progress Notes (Signed)
[name] NOTE  Patient Care Team: Roselee Nova, [name]  CHIEF COMPLAINTS/PURPOSE OF CONSULTATION:   Oncology History   #JUNE 2017- Severe neutropenia/ Anemia ~hb 9/platlets- 85-100 AUG 2017- REFRACTORY ANEMIA with EXCESS BLASTS [14% blasts- BMBx]; cytogenetics/FISH-N [SNP micorarray- not done]   # AUG 21st-  START AZA 75mg /m2 day- 1-7 q 28 days x4 cycles; DEC 6th- BMBx- <5% blasts; hypercellular with dysplasia.   # CKD stage III     MDS (myelodysplastic syndrome), high grade ([place])   04/23/2016 Initial Diagnosis    MDS (myelodysplastic syndrome), high grade (HCC)         HISTORY OF PRESENTING ILLNESS:  Teresa Carrillo 76 y.o.  female with above history of  MDS/high-grade currently started on Vidaza Status post cycle #11 appx 5 weeks ago is here for follow-up.  Since the last cycle patient needed 1 unit of PRBC transfusion for hemoglobin less than 8/symptomatic; also needed to have platelet transfusion for a platelet count of 22/gum bleeding.  She noted to have gum bleeding this morning. Results spontaneously. Denies any fevers. Appetite is fair. No weight loss. Denies any swelling in the legs. Chronic mild fatigue not any worse.  Today she is walking herself. Denies any abdominal pain or diarrhea.  ROS: A complete 10 point review of system is done which is negative except mentioned above in history of present illness.  MEDICAL HISTORY:  Past Medical History:  Diagnosis Date  . Abdominal wall mass   . Anginal pain ([place])   . Anxiety   . Arthritis   . Calculus of kidney   . Cystitis   . Depression   . Diabetes mellitus without complication (HCC)    elevated A1c  . Dyspnea on exertion   . Elevated serum creatinine   . Fibrocystic breast disease   . GERD (gastroesophageal reflux disease)   . Hearing loss   . Heart murmur   . HTN (hypertension)   . Hyperlipidemia   . MDS (myelodysplastic syndrome), high grade  ([place]) 04/23/2016  . Microscopic hematuria   . Mouth sores   . Mucositis due to chemotherapy   . Obesity   . Risk for falls   . Sleep apnea   . Thrombocytopenia ([place])   . Urinary frequency   . Urinary urgency     SURGICAL HISTORY: Past Surgical History:  Procedure Laterality Date  . ABDOMINAL HYSTERECTOMY    . APPENDECTOMY    . CARDIAC CATHETERIZATION     x2  . CHOLECYSTECTOMY    . COLONOSCOPY N/A 02/24/2015   Procedure: COLONOSCOPY;  Surgeon: Manya Silvas, MD;  Location: [facility] ENDOSCOPY;  Service: Endoscopy;  Laterality: N/A;  . DIAGNOSTIC LAPAROSCOPY     Removal of benign abdominal tumor  . ESOPHAGOGASTRODUODENOSCOPY N/A 02/24/2015   Procedure: ESOPHAGOGASTRODUODENOSCOPY (EGD);  Surgeon: Manya Silvas, MD;  Location: [facility] ENDOSCOPY;  Service: Endoscopy;  Laterality: N/A;  . right eye surgery Right     SOCIAL HISTORY: lives with family; [employer]; [employer] in [place] retd. No smoking/ no alcohol.  Social History   Social History  . Marital status: Married    Spouse name: N/A  . Number of children: N/A  . Years of education: N/A   Occupational History  . Not on file.   Social History Main Topics  . Smoking status: Former Smoker    Quit date: 09/09/1988  . Smokeless tobacco: Never Used     Comment: quit 25 years  ago  . Alcohol use No  . Drug use: No  . Sexual activity: Not on file   Other Topics Concern  . Not on file   Social History Narrative  . No narrative on file    FAMILY HISTORY: no cancers in family.  Family History  Problem Relation Age of Onset  . Congestive Heart Failure Mother   . Diabetes Mother   . Coronary artery disease Mother   . Stroke Mother   . Cirrhosis Father     ALLERGIES:  is allergic to macrobid [nitrofurantoin monohyd macro].  MEDICATIONS:  Current Outpatient Prescriptions  Medication Sig Dispense Refill  . acyclovir (ZOVIRAX) 400 MG tablet TAKE 1 TABLET(400 MG) BY MOUTH TWICE DAILY 120 tablet 0  . buPROPion (WELLBUTRIN  XL) 150 MG 24 hr tablet Take 300 mg by mouth daily at 12 noon.     . clonazePAM (KLONOPIN) 0.5 MG tablet Take 0.5 mg by mouth 2 (two) times daily.     Marland Kitchen diltiazem (CARDIZEM CD) 180 MG 24 hr capsule Take 1 capsule (180 mg total) by mouth daily. 30 capsule 0  . hydrocortisone (ANUSOL-HC) 2.5 % rectal cream Place 1 application rectally 2 (two) times daily as needed for hemorrhoids or itching. 30 g 0  . lisinopril (PRINIVIL,ZESTRIL) 2.5 MG tablet Take 1 tablet (2.5 mg total) by mouth daily. 90 tablet 0  . LUMIGAN 0.01 % SOLN Place 1 drop into both eyes at bedtime.     . magic mouthwash w/lidocaine SOLN Take 5 mLs by mouth 4 (four) times daily. 80 ml viscous lidocaine 2%, 80 ml Mylanta, 80 ml Diphenhydramine 12.5 mg/5 ml Elixir, 80 ml Nystatin 100,000 Unit suspension, 80 ml Prednisolone 15 mg/11ml, 80 ml Distilled Water.  Sig: Swish/Swallow 5-10 ml four times a day as needed. Dispense 480 ml. 3RFs 480 mL 3  . metFORMIN (GLUCOPHAGE) 500 MG tablet TAKE 1 TABLET BY MOUTH DAILY 90 tablet 0  . Multiple Vitamin (MULTIVITAMIN WITH MINERALS) TABS tablet Take 1 tablet by mouth daily.    . polyethylene glycol (MIRALAX / GLYCOLAX) packet Take 17 g by mouth daily as needed for mild constipation. 14 each 1  . rosuvastatin (CRESTOR) 20 MG tablet TAKE 1 TABLET(20 MG) BY MOUTH AT BEDTIME 90 tablet 0  . sertraline (ZOLOFT) 100 MG tablet Take 100 mg by mouth daily at 12 noon.     Marland Kitchen Umeclidinium-Vilanterol (ANORO ELLIPTA) 62.5-25 MCG/INH AEPB Inhale 1 puff into the lungs daily as needed (shortness of breath).     . Omega-3 Fatty Acids (FISH OIL) 1000 MG CAPS Take 1 capsule by mouth daily.    . ondansetron (ZOFRAN-ODT) 4 MG disintegrating tablet Take 4 mg by mouth every 8 (eight) hours as needed for nausea or vomiting.    . prochlorperazine (COMPAZINE) 10 MG tablet Take 1 tablet (10 mg total) by mouth every 6 (six) hours as needed (Nausea or vomiting). (Patient not taking: Reported on 04/28/2017) 30 tablet 1   No current  facility-administered medications for this visit.       Marland Kitchen  PHYSICAL EXAMINATION: ECOG PERFORMANCE STATUS: 1 - Symptomatic but completely ambulatory  Vitals:   04/28/17 0944  BP: 133/63  Pulse: 67  Resp: 18  Temp: 97.8 F (36.6 C)   Filed Weights   04/28/17 0944  Weight: 243 lb (110.2 kg)    GENERAL: Well-nourished well-developed; Alert, no distress and comfortable.   Obese. Accompanied by her husband. She is walking by herself. EYES: Positive for pallor. OROPHARYNX: no thrush  or ulceration;  NECK: supple, no masses felt LYMPH:  no palpable lymphadenopathy in the cervical, axillary or inguinal regions LUNGS: clear to auscultation and  No wheeze or crackles HEART/CVS: regular rate & rhythm and no murmurs; No lower extremity edema ABDOMEN: abdomen soft, non-tender and normal bowel sounds Musculoskeletal:no cyanosis of digits and no clubbing  PSYCH: alert & oriented x 3 with fluent speech NEURO: no focal motor/sensory deficits SKIN:  No skin rash/nodules.  LABORATORY DATA:  I have reviewed the data as listed Lab Results  Component Value Date   WBC 2.6 (L) 04/28/2017   HGB 8.9 (L) 04/28/2017   HCT 26.0 (L) 04/28/2017   MCV 98.1 04/28/2017   PLT 81 (L) 04/28/2017    Recent Labs  05/07/16 1652  04/14/17 1136 04/21/17 1200 04/28/17 0929  NA  --   < > 138 142 143  K  --   < > 4.5 4.6 4.9  CL  --   < > 105 109 110  CO2  --   < > 27 27 25   GLUCOSE  --   < > 108* 140* 130*  BUN  --   < > 23* 19 26*  CREATININE  --   < > 1.11* 1.19* 1.41*  CALCIUM  --   < > 9.1 9.1 9.2  GFRNONAA  --   < > 47* 44* 35*  GFRAA  --   < > 55* 50* 41*  PROT 8.3*  < > 7.4 7.1 7.5  ALBUMIN 3.8  < > 3.8 3.8 3.9  AST 26  < > 16 18 18   ALT 25  < > 15 12* 16  ALKPHOS 65  < > 78 67 70  BILITOT 0.6  < > 0.5 0.5 0.5  BILIDIR 0.1  --   --   --   --   IBILI 0.5  --   --   --   --   < > = values in this interval not displayed.  RADIOGRAPHIC STUDIES: I have personally reviewed the radiological  images as listed and agreed with the findings in the report. No results found.  ASSESSMENT & PLAN:   MDS (myelodysplastic syndrome), high grade (HCC) Refractory anemia- with excess blasts- II [blasts percentage 14%] FISH- normal karyotype. On vidaza; s/p  cycle # 8; on April 23rd 2018- BMX- Partial response noted [persistent high grade MDS; aspirate -sub-optimal for blasts evaluation; 90% hyper-cellular bone marrow; dysplastic changes].   # proceed with treatment #12 today; platelets- 81; Labs today reviewed; hemoglobin 8.9. [C discussion below]  # Thrombocytopenia/anemia- likely combination of underlying MDS and secondary to [place]-  Continue treatments every 5 weeks [currently reduced by 20% and also 5 treatment each cycle]. Will plan BMBX after this cycle.   # Gingival inflammation/# Gum bleeding- continue salt/baking soda rinses.  recommend STOPPING asprin.   #CKD stage III-stable.  # Prophylactic antibiotics- acyclovir/ diflucan.   # follow up in 5 weeks sep 24th/ labs weekly; BMBx-in 4 weeks.      Cammie Sickle, MD 04/28/2017 5:06 PM

## 2017-04-28 NOTE — Progress Notes (Signed)
Patient does not offer any problems today.  

## 2017-04-29 ENCOUNTER — Inpatient Hospital Stay: Payer: Medicare Other

## 2017-04-29 DIAGNOSIS — D4622 Refractory anemia with excess of blasts 2: Secondary | ICD-10-CM | POA: Diagnosis not present

## 2017-04-29 DIAGNOSIS — R231 Pallor: Secondary | ICD-10-CM | POA: Diagnosis not present

## 2017-04-29 DIAGNOSIS — I129 Hypertensive chronic kidney disease with stage 1 through stage 4 chronic kidney disease, or unspecified chronic kidney disease: Secondary | ICD-10-CM | POA: Diagnosis not present

## 2017-04-29 DIAGNOSIS — Z79899 Other long term (current) drug therapy: Secondary | ICD-10-CM | POA: Diagnosis not present

## 2017-04-29 DIAGNOSIS — Z792 Long term (current) use of antibiotics: Secondary | ICD-10-CM | POA: Diagnosis not present

## 2017-04-29 DIAGNOSIS — Z881 Allergy status to other antibiotic agents status: Secondary | ICD-10-CM | POA: Diagnosis not present

## 2017-04-29 DIAGNOSIS — D46Z Other myelodysplastic syndromes: Secondary | ICD-10-CM

## 2017-04-29 DIAGNOSIS — K068 Other specified disorders of gingiva and edentulous alveolar ridge: Secondary | ICD-10-CM | POA: Diagnosis not present

## 2017-04-29 DIAGNOSIS — E1122 Type 2 diabetes mellitus with diabetic chronic kidney disease: Secondary | ICD-10-CM | POA: Diagnosis not present

## 2017-04-29 DIAGNOSIS — Z7984 Long term (current) use of oral hypoglycemic drugs: Secondary | ICD-10-CM | POA: Diagnosis not present

## 2017-04-29 DIAGNOSIS — D696 Thrombocytopenia, unspecified: Secondary | ICD-10-CM | POA: Diagnosis not present

## 2017-04-29 DIAGNOSIS — Z87891 Personal history of nicotine dependence: Secondary | ICD-10-CM | POA: Diagnosis not present

## 2017-04-29 DIAGNOSIS — R5382 Chronic fatigue, unspecified: Secondary | ICD-10-CM | POA: Diagnosis not present

## 2017-04-29 DIAGNOSIS — N183 Chronic kidney disease, stage 3 (moderate): Secondary | ICD-10-CM | POA: Diagnosis not present

## 2017-04-29 DIAGNOSIS — E785 Hyperlipidemia, unspecified: Secondary | ICD-10-CM | POA: Diagnosis not present

## 2017-04-29 MED ORDER — ONDANSETRON HCL 4 MG PO TABS
8.0000 mg | ORAL_TABLET | Freq: Once | ORAL | Status: AC
  Administered 2017-04-29: 8 mg via ORAL
  Filled 2017-04-29: qty 2

## 2017-04-29 MED ORDER — AZACITIDINE CHEMO SQ INJECTION
130.0000 mg | Freq: Once | INTRAMUSCULAR | Status: AC
  Administered 2017-04-29: 130 mg via SUBCUTANEOUS
  Filled 2017-04-29: qty 5.2

## 2017-04-30 ENCOUNTER — Inpatient Hospital Stay: Payer: Medicare Other

## 2017-04-30 VITALS — BP 137/70 | HR 74 | Temp 98.8°F | Resp 18

## 2017-04-30 DIAGNOSIS — E785 Hyperlipidemia, unspecified: Secondary | ICD-10-CM | POA: Diagnosis not present

## 2017-04-30 DIAGNOSIS — D696 Thrombocytopenia, unspecified: Secondary | ICD-10-CM | POA: Diagnosis not present

## 2017-04-30 DIAGNOSIS — R231 Pallor: Secondary | ICD-10-CM | POA: Diagnosis not present

## 2017-04-30 DIAGNOSIS — Z79899 Other long term (current) drug therapy: Secondary | ICD-10-CM | POA: Diagnosis not present

## 2017-04-30 DIAGNOSIS — Z792 Long term (current) use of antibiotics: Secondary | ICD-10-CM | POA: Diagnosis not present

## 2017-04-30 DIAGNOSIS — D4622 Refractory anemia with excess of blasts 2: Secondary | ICD-10-CM | POA: Diagnosis not present

## 2017-04-30 DIAGNOSIS — E1122 Type 2 diabetes mellitus with diabetic chronic kidney disease: Secondary | ICD-10-CM | POA: Diagnosis not present

## 2017-04-30 DIAGNOSIS — I129 Hypertensive chronic kidney disease with stage 1 through stage 4 chronic kidney disease, or unspecified chronic kidney disease: Secondary | ICD-10-CM | POA: Diagnosis not present

## 2017-04-30 DIAGNOSIS — D46Z Other myelodysplastic syndromes: Secondary | ICD-10-CM

## 2017-04-30 DIAGNOSIS — Z87891 Personal history of nicotine dependence: Secondary | ICD-10-CM | POA: Diagnosis not present

## 2017-04-30 DIAGNOSIS — N183 Chronic kidney disease, stage 3 (moderate): Secondary | ICD-10-CM | POA: Diagnosis not present

## 2017-04-30 DIAGNOSIS — Z881 Allergy status to other antibiotic agents status: Secondary | ICD-10-CM | POA: Diagnosis not present

## 2017-04-30 DIAGNOSIS — R5382 Chronic fatigue, unspecified: Secondary | ICD-10-CM | POA: Diagnosis not present

## 2017-04-30 DIAGNOSIS — Z7984 Long term (current) use of oral hypoglycemic drugs: Secondary | ICD-10-CM | POA: Diagnosis not present

## 2017-04-30 DIAGNOSIS — K068 Other specified disorders of gingiva and edentulous alveolar ridge: Secondary | ICD-10-CM | POA: Diagnosis not present

## 2017-04-30 MED ORDER — ONDANSETRON HCL 4 MG PO TABS
8.0000 mg | ORAL_TABLET | Freq: Once | ORAL | Status: AC
  Administered 2017-04-30: 8 mg via ORAL
  Filled 2017-04-30: qty 2

## 2017-04-30 MED ORDER — AZACITIDINE CHEMO SQ INJECTION
130.0000 mg | Freq: Once | INTRAMUSCULAR | Status: AC
  Administered 2017-04-30: 130 mg via SUBCUTANEOUS
  Filled 2017-04-30: qty 5.2

## 2017-05-01 ENCOUNTER — Inpatient Hospital Stay: Payer: Medicare Other

## 2017-05-01 DIAGNOSIS — Z7984 Long term (current) use of oral hypoglycemic drugs: Secondary | ICD-10-CM | POA: Diagnosis not present

## 2017-05-01 DIAGNOSIS — R5382 Chronic fatigue, unspecified: Secondary | ICD-10-CM | POA: Diagnosis not present

## 2017-05-01 DIAGNOSIS — Z881 Allergy status to other antibiotic agents status: Secondary | ICD-10-CM | POA: Diagnosis not present

## 2017-05-01 DIAGNOSIS — Z792 Long term (current) use of antibiotics: Secondary | ICD-10-CM | POA: Diagnosis not present

## 2017-05-01 DIAGNOSIS — I129 Hypertensive chronic kidney disease with stage 1 through stage 4 chronic kidney disease, or unspecified chronic kidney disease: Secondary | ICD-10-CM | POA: Diagnosis not present

## 2017-05-01 DIAGNOSIS — Z87891 Personal history of nicotine dependence: Secondary | ICD-10-CM | POA: Diagnosis not present

## 2017-05-01 DIAGNOSIS — E1122 Type 2 diabetes mellitus with diabetic chronic kidney disease: Secondary | ICD-10-CM | POA: Diagnosis not present

## 2017-05-01 DIAGNOSIS — D46Z Other myelodysplastic syndromes: Secondary | ICD-10-CM

## 2017-05-01 DIAGNOSIS — K068 Other specified disorders of gingiva and edentulous alveolar ridge: Secondary | ICD-10-CM | POA: Diagnosis not present

## 2017-05-01 DIAGNOSIS — E785 Hyperlipidemia, unspecified: Secondary | ICD-10-CM | POA: Diagnosis not present

## 2017-05-01 DIAGNOSIS — D696 Thrombocytopenia, unspecified: Secondary | ICD-10-CM | POA: Diagnosis not present

## 2017-05-01 DIAGNOSIS — Z79899 Other long term (current) drug therapy: Secondary | ICD-10-CM | POA: Diagnosis not present

## 2017-05-01 DIAGNOSIS — D4622 Refractory anemia with excess of blasts 2: Secondary | ICD-10-CM | POA: Diagnosis not present

## 2017-05-01 DIAGNOSIS — N183 Chronic kidney disease, stage 3 (moderate): Secondary | ICD-10-CM | POA: Diagnosis not present

## 2017-05-01 DIAGNOSIS — R231 Pallor: Secondary | ICD-10-CM | POA: Diagnosis not present

## 2017-05-01 MED ORDER — ONDANSETRON HCL 4 MG PO TABS
8.0000 mg | ORAL_TABLET | Freq: Once | ORAL | Status: AC
  Administered 2017-05-01: 8 mg via ORAL
  Filled 2017-05-01: qty 2

## 2017-05-01 MED ORDER — AZACITIDINE CHEMO SQ INJECTION
130.0000 mg | Freq: Once | INTRAMUSCULAR | Status: AC
  Administered 2017-05-01: 130 mg via SUBCUTANEOUS
  Filled 2017-05-01: qty 5.2

## 2017-05-02 ENCOUNTER — Inpatient Hospital Stay: Payer: Medicare Other

## 2017-05-02 VITALS — BP 129/74 | HR 66 | Temp 96.4°F | Resp 20

## 2017-05-02 DIAGNOSIS — E785 Hyperlipidemia, unspecified: Secondary | ICD-10-CM | POA: Diagnosis not present

## 2017-05-02 DIAGNOSIS — N183 Chronic kidney disease, stage 3 (moderate): Secondary | ICD-10-CM | POA: Diagnosis not present

## 2017-05-02 DIAGNOSIS — Z7984 Long term (current) use of oral hypoglycemic drugs: Secondary | ICD-10-CM | POA: Diagnosis not present

## 2017-05-02 DIAGNOSIS — Z79899 Other long term (current) drug therapy: Secondary | ICD-10-CM | POA: Diagnosis not present

## 2017-05-02 DIAGNOSIS — D4622 Refractory anemia with excess of blasts 2: Secondary | ICD-10-CM | POA: Diagnosis not present

## 2017-05-02 DIAGNOSIS — E1122 Type 2 diabetes mellitus with diabetic chronic kidney disease: Secondary | ICD-10-CM | POA: Diagnosis not present

## 2017-05-02 DIAGNOSIS — D696 Thrombocytopenia, unspecified: Secondary | ICD-10-CM | POA: Diagnosis not present

## 2017-05-02 DIAGNOSIS — Z792 Long term (current) use of antibiotics: Secondary | ICD-10-CM | POA: Diagnosis not present

## 2017-05-02 DIAGNOSIS — D46Z Other myelodysplastic syndromes: Secondary | ICD-10-CM

## 2017-05-02 DIAGNOSIS — K068 Other specified disorders of gingiva and edentulous alveolar ridge: Secondary | ICD-10-CM | POA: Diagnosis not present

## 2017-05-02 DIAGNOSIS — Z87891 Personal history of nicotine dependence: Secondary | ICD-10-CM | POA: Diagnosis not present

## 2017-05-02 DIAGNOSIS — R231 Pallor: Secondary | ICD-10-CM | POA: Diagnosis not present

## 2017-05-02 DIAGNOSIS — I129 Hypertensive chronic kidney disease with stage 1 through stage 4 chronic kidney disease, or unspecified chronic kidney disease: Secondary | ICD-10-CM | POA: Diagnosis not present

## 2017-05-02 DIAGNOSIS — Z881 Allergy status to other antibiotic agents status: Secondary | ICD-10-CM | POA: Diagnosis not present

## 2017-05-02 DIAGNOSIS — R5382 Chronic fatigue, unspecified: Secondary | ICD-10-CM | POA: Diagnosis not present

## 2017-05-02 MED ORDER — ONDANSETRON HCL 4 MG PO TABS
8.0000 mg | ORAL_TABLET | Freq: Once | ORAL | Status: AC
  Administered 2017-05-02: 8 mg via ORAL
  Filled 2017-05-02: qty 2

## 2017-05-02 MED ORDER — AZACITIDINE CHEMO SQ INJECTION
130.0000 mg | Freq: Once | INTRAMUSCULAR | Status: AC
  Administered 2017-05-02: 130 mg via SUBCUTANEOUS
  Filled 2017-05-02: qty 5.2

## 2017-05-05 ENCOUNTER — Inpatient Hospital Stay: Payer: Medicare Other

## 2017-05-05 DIAGNOSIS — E785 Hyperlipidemia, unspecified: Secondary | ICD-10-CM | POA: Diagnosis not present

## 2017-05-05 DIAGNOSIS — Z79899 Other long term (current) drug therapy: Secondary | ICD-10-CM | POA: Diagnosis not present

## 2017-05-05 DIAGNOSIS — Z792 Long term (current) use of antibiotics: Secondary | ICD-10-CM | POA: Diagnosis not present

## 2017-05-05 DIAGNOSIS — D4622 Refractory anemia with excess of blasts 2: Secondary | ICD-10-CM | POA: Diagnosis not present

## 2017-05-05 DIAGNOSIS — Z881 Allergy status to other antibiotic agents status: Secondary | ICD-10-CM | POA: Diagnosis not present

## 2017-05-05 DIAGNOSIS — D46Z Other myelodysplastic syndromes: Secondary | ICD-10-CM

## 2017-05-05 DIAGNOSIS — R5382 Chronic fatigue, unspecified: Secondary | ICD-10-CM | POA: Diagnosis not present

## 2017-05-05 DIAGNOSIS — D696 Thrombocytopenia, unspecified: Secondary | ICD-10-CM | POA: Diagnosis not present

## 2017-05-05 DIAGNOSIS — K068 Other specified disorders of gingiva and edentulous alveolar ridge: Secondary | ICD-10-CM | POA: Diagnosis not present

## 2017-05-05 DIAGNOSIS — E1122 Type 2 diabetes mellitus with diabetic chronic kidney disease: Secondary | ICD-10-CM | POA: Diagnosis not present

## 2017-05-05 DIAGNOSIS — N183 Chronic kidney disease, stage 3 (moderate): Secondary | ICD-10-CM | POA: Diagnosis not present

## 2017-05-05 DIAGNOSIS — Z87891 Personal history of nicotine dependence: Secondary | ICD-10-CM | POA: Diagnosis not present

## 2017-05-05 DIAGNOSIS — R231 Pallor: Secondary | ICD-10-CM | POA: Diagnosis not present

## 2017-05-05 DIAGNOSIS — I129 Hypertensive chronic kidney disease with stage 1 through stage 4 chronic kidney disease, or unspecified chronic kidney disease: Secondary | ICD-10-CM | POA: Diagnosis not present

## 2017-05-05 DIAGNOSIS — Z7984 Long term (current) use of oral hypoglycemic drugs: Secondary | ICD-10-CM | POA: Diagnosis not present

## 2017-05-05 LAB — CBC WITH DIFFERENTIAL/PLATELET
BASOS PCT: 2 %
Basophils Absolute: 0 10*3/uL (ref 0–0.1)
EOS PCT: 1 %
Eosinophils Absolute: 0 10*3/uL (ref 0–0.7)
HCT: 25.5 % — ABNORMAL LOW (ref 35.0–47.0)
Hemoglobin: 8.8 g/dL — ABNORMAL LOW (ref 12.0–16.0)
Lymphocytes Relative: 43 %
Lymphs Abs: 0.8 10*3/uL — ABNORMAL LOW (ref 1.0–3.6)
MCH: 33.2 pg (ref 26.0–34.0)
MCHC: 34.3 g/dL (ref 32.0–36.0)
MCV: 96.8 fL (ref 80.0–100.0)
MONO ABS: 0.2 10*3/uL (ref 0.2–0.9)
Monocytes Relative: 9 %
NEUTROS ABS: 0.9 10*3/uL — AB (ref 1.4–6.5)
NEUTROS PCT: 45 %
PLATELETS: 109 10*3/uL — AB (ref 150–440)
RBC: 2.64 MIL/uL — ABNORMAL LOW (ref 3.80–5.20)
RDW: 19 % — ABNORMAL HIGH (ref 11.5–14.5)
WBC: 1.9 10*3/uL — ABNORMAL LOW (ref 3.6–11.0)

## 2017-05-05 LAB — SAMPLE TO BLOOD BANK

## 2017-05-07 DIAGNOSIS — H209 Unspecified iridocyclitis: Secondary | ICD-10-CM | POA: Diagnosis not present

## 2017-05-08 DIAGNOSIS — H209 Unspecified iridocyclitis: Secondary | ICD-10-CM | POA: Diagnosis not present

## 2017-05-13 ENCOUNTER — Inpatient Hospital Stay: Payer: Medicare Other | Attending: Internal Medicine

## 2017-05-13 DIAGNOSIS — E1122 Type 2 diabetes mellitus with diabetic chronic kidney disease: Secondary | ICD-10-CM | POA: Diagnosis not present

## 2017-05-13 DIAGNOSIS — C9202 Acute myeloblastic leukemia, in relapse: Secondary | ICD-10-CM | POA: Diagnosis not present

## 2017-05-13 DIAGNOSIS — D4622 Refractory anemia with excess of blasts 2: Secondary | ICD-10-CM | POA: Insufficient documentation

## 2017-05-13 DIAGNOSIS — N183 Chronic kidney disease, stage 3 (moderate): Secondary | ICD-10-CM | POA: Insufficient documentation

## 2017-05-13 DIAGNOSIS — I129 Hypertensive chronic kidney disease with stage 1 through stage 4 chronic kidney disease, or unspecified chronic kidney disease: Secondary | ICD-10-CM | POA: Diagnosis not present

## 2017-05-13 DIAGNOSIS — Z7984 Long term (current) use of oral hypoglycemic drugs: Secondary | ICD-10-CM | POA: Diagnosis not present

## 2017-05-13 DIAGNOSIS — D46Z Other myelodysplastic syndromes: Secondary | ICD-10-CM

## 2017-05-13 DIAGNOSIS — Z79899 Other long term (current) drug therapy: Secondary | ICD-10-CM | POA: Diagnosis not present

## 2017-05-13 DIAGNOSIS — M1990 Unspecified osteoarthritis, unspecified site: Secondary | ICD-10-CM | POA: Diagnosis not present

## 2017-05-13 DIAGNOSIS — H209 Unspecified iridocyclitis: Secondary | ICD-10-CM | POA: Diagnosis not present

## 2017-05-13 DIAGNOSIS — F418 Other specified anxiety disorders: Secondary | ICD-10-CM | POA: Insufficient documentation

## 2017-05-13 DIAGNOSIS — K219 Gastro-esophageal reflux disease without esophagitis: Secondary | ICD-10-CM | POA: Insufficient documentation

## 2017-05-13 DIAGNOSIS — Z87891 Personal history of nicotine dependence: Secondary | ICD-10-CM | POA: Insufficient documentation

## 2017-05-13 DIAGNOSIS — Z792 Long term (current) use of antibiotics: Secondary | ICD-10-CM | POA: Diagnosis not present

## 2017-05-13 DIAGNOSIS — Z881 Allergy status to other antibiotic agents status: Secondary | ICD-10-CM | POA: Diagnosis not present

## 2017-05-13 DIAGNOSIS — E669 Obesity, unspecified: Secondary | ICD-10-CM | POA: Insufficient documentation

## 2017-05-13 DIAGNOSIS — R231 Pallor: Secondary | ICD-10-CM | POA: Diagnosis not present

## 2017-05-13 DIAGNOSIS — Z87442 Personal history of urinary calculi: Secondary | ICD-10-CM | POA: Diagnosis not present

## 2017-05-13 DIAGNOSIS — E785 Hyperlipidemia, unspecified: Secondary | ICD-10-CM | POA: Insufficient documentation

## 2017-05-13 LAB — CBC WITH DIFFERENTIAL/PLATELET
BASOS PCT: 1 %
Basophils Absolute: 0 10*3/uL (ref 0–0.1)
EOS ABS: 0 10*3/uL (ref 0–0.7)
Eosinophils Relative: 1 %
HEMATOCRIT: 24.9 % — AB (ref 35.0–47.0)
Hemoglobin: 8.7 g/dL — ABNORMAL LOW (ref 12.0–16.0)
LYMPHS PCT: 45 %
Lymphs Abs: 1.3 10*3/uL (ref 1.0–3.6)
MCH: 33.8 pg (ref 26.0–34.0)
MCHC: 34.7 g/dL (ref 32.0–36.0)
MCV: 97.3 fL (ref 80.0–100.0)
MONOS PCT: 6 %
Monocytes Absolute: 0.2 10*3/uL (ref 0.2–0.9)
NEUTROS ABS: 1.4 10*3/uL (ref 1.4–6.5)
Neutrophils Relative %: 47 %
Platelets: 48 10*3/uL — ABNORMAL LOW (ref 150–400)
RBC: 2.56 MIL/uL — ABNORMAL LOW (ref 3.80–5.20)
RDW: 19.4 % — AB (ref 11.5–14.5)
WBC: 2.9 10*3/uL — ABNORMAL LOW (ref 3.6–11.0)

## 2017-05-13 LAB — SAMPLE TO BLOOD BANK

## 2017-05-14 DIAGNOSIS — G4733 Obstructive sleep apnea (adult) (pediatric): Secondary | ICD-10-CM | POA: Diagnosis not present

## 2017-05-14 DIAGNOSIS — J439 Emphysema, unspecified: Secondary | ICD-10-CM | POA: Diagnosis not present

## 2017-05-14 DIAGNOSIS — R0609 Other forms of dyspnea: Secondary | ICD-10-CM | POA: Diagnosis not present

## 2017-05-19 ENCOUNTER — Telehealth: Payer: Self-pay | Admitting: Internal Medicine

## 2017-05-19 ENCOUNTER — Inpatient Hospital Stay: Payer: Medicare Other

## 2017-05-19 ENCOUNTER — Other Ambulatory Visit: Payer: Self-pay | Admitting: *Deleted

## 2017-05-19 DIAGNOSIS — D4622 Refractory anemia with excess of blasts 2: Secondary | ICD-10-CM | POA: Diagnosis not present

## 2017-05-19 DIAGNOSIS — Z79899 Other long term (current) drug therapy: Secondary | ICD-10-CM | POA: Diagnosis not present

## 2017-05-19 DIAGNOSIS — C9202 Acute myeloblastic leukemia, in relapse: Secondary | ICD-10-CM | POA: Diagnosis not present

## 2017-05-19 DIAGNOSIS — Z792 Long term (current) use of antibiotics: Secondary | ICD-10-CM | POA: Diagnosis not present

## 2017-05-19 DIAGNOSIS — K219 Gastro-esophageal reflux disease without esophagitis: Secondary | ICD-10-CM | POA: Diagnosis not present

## 2017-05-19 DIAGNOSIS — M1990 Unspecified osteoarthritis, unspecified site: Secondary | ICD-10-CM | POA: Diagnosis not present

## 2017-05-19 DIAGNOSIS — N183 Chronic kidney disease, stage 3 (moderate): Secondary | ICD-10-CM | POA: Diagnosis not present

## 2017-05-19 DIAGNOSIS — H209 Unspecified iridocyclitis: Secondary | ICD-10-CM | POA: Diagnosis not present

## 2017-05-19 DIAGNOSIS — Z87891 Personal history of nicotine dependence: Secondary | ICD-10-CM | POA: Diagnosis not present

## 2017-05-19 DIAGNOSIS — E785 Hyperlipidemia, unspecified: Secondary | ICD-10-CM | POA: Diagnosis not present

## 2017-05-19 DIAGNOSIS — D46Z Other myelodysplastic syndromes: Secondary | ICD-10-CM

## 2017-05-19 DIAGNOSIS — Z881 Allergy status to other antibiotic agents status: Secondary | ICD-10-CM | POA: Diagnosis not present

## 2017-05-19 DIAGNOSIS — R231 Pallor: Secondary | ICD-10-CM | POA: Diagnosis not present

## 2017-05-19 DIAGNOSIS — I129 Hypertensive chronic kidney disease with stage 1 through stage 4 chronic kidney disease, or unspecified chronic kidney disease: Secondary | ICD-10-CM | POA: Diagnosis not present

## 2017-05-19 DIAGNOSIS — Z87442 Personal history of urinary calculi: Secondary | ICD-10-CM | POA: Diagnosis not present

## 2017-05-19 DIAGNOSIS — Z7984 Long term (current) use of oral hypoglycemic drugs: Secondary | ICD-10-CM | POA: Diagnosis not present

## 2017-05-19 DIAGNOSIS — D696 Thrombocytopenia, unspecified: Secondary | ICD-10-CM

## 2017-05-19 DIAGNOSIS — E1122 Type 2 diabetes mellitus with diabetic chronic kidney disease: Secondary | ICD-10-CM | POA: Diagnosis not present

## 2017-05-19 LAB — CBC WITH DIFFERENTIAL/PLATELET
BASOS ABS: 0 10*3/uL (ref 0–0.1)
BASOS PCT: 1 %
EOS ABS: 0 10*3/uL (ref 0–0.7)
Eosinophils Relative: 0 %
HCT: 23.1 % — ABNORMAL LOW (ref 35.0–47.0)
Hemoglobin: 8 g/dL — ABNORMAL LOW (ref 12.0–16.0)
Lymphocytes Relative: 45 %
Lymphs Abs: 1.1 10*3/uL (ref 1.0–3.6)
MCH: 33.9 pg (ref 26.0–34.0)
MCHC: 34.7 g/dL (ref 32.0–36.0)
MCV: 97.6 fL (ref 80.0–100.0)
Monocytes Absolute: 0.1 10*3/uL — ABNORMAL LOW (ref 0.2–0.9)
Monocytes Relative: 5 %
NEUTROS PCT: 49 %
Neutro Abs: 1.3 10*3/uL — ABNORMAL LOW (ref 1.4–6.5)
Platelets: 26 10*3/uL — CL (ref 150–400)
RBC: 2.37 MIL/uL — AB (ref 3.80–5.20)
RDW: 19.5 % — ABNORMAL HIGH (ref 11.5–14.5)
WBC: 2.5 10*3/uL — AB (ref 3.6–11.0)

## 2017-05-19 LAB — SAMPLE TO BLOOD BANK

## 2017-05-19 NOTE — Telephone Encounter (Signed)
2 hrs Platelets on 05/20/17, per [name]. (ok, per Kim/Charge Nurse) MF

## 2017-05-19 NOTE — Progress Notes (Signed)
I was notified by [facility] in the lab that patient's platelette count is 26. Dr. Rogue Bussing was notified at 12:23PM of this critical value. Dr. Rogue Bussing states that as long as patient is not experiencing any active bleeding, then patient can come in for follow up appointment on 05/26/17 & 06/02/17 as planned.  I attempted to contact patient but I was unable to reach. I left a message for patient to return phone call.

## 2017-05-20 ENCOUNTER — Inpatient Hospital Stay: Payer: Medicare Other

## 2017-05-20 DIAGNOSIS — D4622 Refractory anemia with excess of blasts 2: Secondary | ICD-10-CM | POA: Diagnosis not present

## 2017-05-20 DIAGNOSIS — E1122 Type 2 diabetes mellitus with diabetic chronic kidney disease: Secondary | ICD-10-CM | POA: Diagnosis not present

## 2017-05-20 DIAGNOSIS — Z87442 Personal history of urinary calculi: Secondary | ICD-10-CM | POA: Diagnosis not present

## 2017-05-20 DIAGNOSIS — N183 Chronic kidney disease, stage 3 (moderate): Secondary | ICD-10-CM | POA: Diagnosis not present

## 2017-05-20 DIAGNOSIS — I129 Hypertensive chronic kidney disease with stage 1 through stage 4 chronic kidney disease, or unspecified chronic kidney disease: Secondary | ICD-10-CM | POA: Diagnosis not present

## 2017-05-20 DIAGNOSIS — R231 Pallor: Secondary | ICD-10-CM | POA: Diagnosis not present

## 2017-05-20 DIAGNOSIS — D696 Thrombocytopenia, unspecified: Secondary | ICD-10-CM

## 2017-05-20 DIAGNOSIS — Z87891 Personal history of nicotine dependence: Secondary | ICD-10-CM | POA: Diagnosis not present

## 2017-05-20 DIAGNOSIS — M1990 Unspecified osteoarthritis, unspecified site: Secondary | ICD-10-CM | POA: Diagnosis not present

## 2017-05-20 DIAGNOSIS — E785 Hyperlipidemia, unspecified: Secondary | ICD-10-CM | POA: Diagnosis not present

## 2017-05-20 DIAGNOSIS — C9202 Acute myeloblastic leukemia, in relapse: Secondary | ICD-10-CM | POA: Diagnosis not present

## 2017-05-20 DIAGNOSIS — Z792 Long term (current) use of antibiotics: Secondary | ICD-10-CM | POA: Diagnosis not present

## 2017-05-20 DIAGNOSIS — Z79899 Other long term (current) drug therapy: Secondary | ICD-10-CM | POA: Diagnosis not present

## 2017-05-20 DIAGNOSIS — K219 Gastro-esophageal reflux disease without esophagitis: Secondary | ICD-10-CM | POA: Diagnosis not present

## 2017-05-20 DIAGNOSIS — Z881 Allergy status to other antibiotic agents status: Secondary | ICD-10-CM | POA: Diagnosis not present

## 2017-05-20 DIAGNOSIS — Z7984 Long term (current) use of oral hypoglycemic drugs: Secondary | ICD-10-CM | POA: Diagnosis not present

## 2017-05-20 MED ORDER — ACETAMINOPHEN 325 MG PO TABS
650.0000 mg | ORAL_TABLET | Freq: Once | ORAL | Status: AC
  Administered 2017-05-20: 650 mg via ORAL
  Filled 2017-05-20: qty 2

## 2017-05-20 MED ORDER — SODIUM CHLORIDE 0.9 % IV SOLN
250.0000 mL | Freq: Once | INTRAVENOUS | Status: AC
  Administered 2017-05-20: 250 mL via INTRAVENOUS
  Filled 2017-05-20: qty 250

## 2017-05-20 MED ORDER — DIPHENHYDRAMINE HCL 25 MG PO CAPS
25.0000 mg | ORAL_CAPSULE | Freq: Once | ORAL | Status: AC
  Administered 2017-05-20: 25 mg via ORAL
  Filled 2017-05-20: qty 1

## 2017-05-20 MED ORDER — SODIUM CHLORIDE 0.9% FLUSH
10.0000 mL | INTRAVENOUS | Status: DC | PRN
  Filled 2017-05-20: qty 10

## 2017-05-21 ENCOUNTER — Other Ambulatory Visit: Payer: Self-pay | Admitting: Family Medicine

## 2017-05-21 DIAGNOSIS — E782 Mixed hyperlipidemia: Secondary | ICD-10-CM

## 2017-05-21 LAB — PREPARE PLATELET PHERESIS: Unit division: 0

## 2017-05-21 LAB — BPAM PLATELET PHERESIS
Blood Product Expiration Date: 201809122359
ISSUE DATE / TIME: 201809111437
Unit Type and Rh: 6200

## 2017-05-25 ENCOUNTER — Other Ambulatory Visit: Payer: Self-pay | Admitting: Internal Medicine

## 2017-05-25 DIAGNOSIS — D46Z Other myelodysplastic syndromes: Secondary | ICD-10-CM

## 2017-05-26 ENCOUNTER — Other Ambulatory Visit: Payer: Self-pay | Admitting: *Deleted

## 2017-05-26 ENCOUNTER — Inpatient Hospital Stay: Payer: Medicare Other

## 2017-05-26 DIAGNOSIS — Z7984 Long term (current) use of oral hypoglycemic drugs: Secondary | ICD-10-CM | POA: Diagnosis not present

## 2017-05-26 DIAGNOSIS — D4622 Refractory anemia with excess of blasts 2: Secondary | ICD-10-CM | POA: Diagnosis not present

## 2017-05-26 DIAGNOSIS — N183 Chronic kidney disease, stage 3 (moderate): Secondary | ICD-10-CM | POA: Diagnosis not present

## 2017-05-26 DIAGNOSIS — M1990 Unspecified osteoarthritis, unspecified site: Secondary | ICD-10-CM | POA: Diagnosis not present

## 2017-05-26 DIAGNOSIS — C9202 Acute myeloblastic leukemia, in relapse: Secondary | ICD-10-CM | POA: Diagnosis not present

## 2017-05-26 DIAGNOSIS — R231 Pallor: Secondary | ICD-10-CM | POA: Diagnosis not present

## 2017-05-26 DIAGNOSIS — K219 Gastro-esophageal reflux disease without esophagitis: Secondary | ICD-10-CM | POA: Diagnosis not present

## 2017-05-26 DIAGNOSIS — E1122 Type 2 diabetes mellitus with diabetic chronic kidney disease: Secondary | ICD-10-CM | POA: Diagnosis not present

## 2017-05-26 DIAGNOSIS — Z881 Allergy status to other antibiotic agents status: Secondary | ICD-10-CM | POA: Diagnosis not present

## 2017-05-26 DIAGNOSIS — Z792 Long term (current) use of antibiotics: Secondary | ICD-10-CM | POA: Diagnosis not present

## 2017-05-26 DIAGNOSIS — Z87891 Personal history of nicotine dependence: Secondary | ICD-10-CM | POA: Diagnosis not present

## 2017-05-26 DIAGNOSIS — D46Z Other myelodysplastic syndromes: Secondary | ICD-10-CM

## 2017-05-26 DIAGNOSIS — E785 Hyperlipidemia, unspecified: Secondary | ICD-10-CM | POA: Diagnosis not present

## 2017-05-26 DIAGNOSIS — Z79899 Other long term (current) drug therapy: Secondary | ICD-10-CM | POA: Diagnosis not present

## 2017-05-26 DIAGNOSIS — I129 Hypertensive chronic kidney disease with stage 1 through stage 4 chronic kidney disease, or unspecified chronic kidney disease: Secondary | ICD-10-CM | POA: Diagnosis not present

## 2017-05-26 DIAGNOSIS — Z87442 Personal history of urinary calculi: Secondary | ICD-10-CM | POA: Diagnosis not present

## 2017-05-26 LAB — CBC WITH DIFFERENTIAL/PLATELET
BASOS ABS: 0.1 10*3/uL (ref 0–0.1)
Basophils Relative: 5 %
Eosinophils Absolute: 0 10*3/uL (ref 0–0.7)
Eosinophils Relative: 0 %
HEMATOCRIT: 23.7 % — AB (ref 35.0–47.0)
Hemoglobin: 8.1 g/dL — ABNORMAL LOW (ref 12.0–16.0)
LYMPHS ABS: 1.3 10*3/uL (ref 1.0–3.6)
LYMPHS PCT: 47 %
MCH: 33.7 pg (ref 26.0–34.0)
MCHC: 34.2 g/dL (ref 32.0–36.0)
MCV: 98.6 fL (ref 80.0–100.0)
MONO ABS: 0.2 10*3/uL (ref 0.2–0.9)
Monocytes Relative: 6 %
NEUTROS ABS: 1.1 10*3/uL — AB (ref 1.4–6.5)
Neutrophils Relative %: 42 %
Platelets: 58 10*3/uL — ABNORMAL LOW (ref 150–440)
RBC: 2.41 MIL/uL — AB (ref 3.80–5.20)
RDW: 20 % — AB (ref 11.5–14.5)
WBC: 2.7 10*3/uL — ABNORMAL LOW (ref 3.6–11.0)

## 2017-05-26 LAB — SAMPLE TO BLOOD BANK

## 2017-05-26 MED ORDER — ACYCLOVIR 400 MG PO TABS: ORAL_TABLET | ORAL | 0 refills | Status: DC

## 2017-05-26 NOTE — Progress Notes (Unsigned)
CBC

## 2017-05-27 ENCOUNTER — Other Ambulatory Visit: Payer: Self-pay | Admitting: Family Medicine

## 2017-05-27 ENCOUNTER — Other Ambulatory Visit: Payer: Self-pay | Admitting: Physician Assistant

## 2017-05-27 DIAGNOSIS — E782 Mixed hyperlipidemia: Secondary | ICD-10-CM

## 2017-05-28 ENCOUNTER — Ambulatory Visit
Admission: RE | Admit: 2017-05-28 | Discharge: 2017-05-28 | Disposition: A | Discharge status: Home / Self Care | Payer: Medicare Other | Source: Ambulatory Visit | Attending: Internal Medicine | Admitting: Internal Medicine

## 2017-05-28 ENCOUNTER — Other Ambulatory Visit (HOSPITAL_COMMUNITY)
Admission: RE | Admit: 2017-05-28 | Discharge: 2017-05-28 | Disposition: A | Discharge status: Home / Self Care | Payer: Medicare Other | Source: Ambulatory Visit | Attending: Internal Medicine | Admitting: Internal Medicine

## 2017-05-28 ENCOUNTER — Ambulatory Visit
Admit: 2017-05-28 | Discharge: 2017-05-28 | Payer: MEDICARE | Attending: Internal Medicine | Admitting: Internal Medicine

## 2017-05-28 DIAGNOSIS — G4730 Sleep apnea, unspecified: Secondary | ICD-10-CM | POA: Diagnosis not present

## 2017-05-28 DIAGNOSIS — N189 Chronic kidney disease, unspecified: Secondary | ICD-10-CM | POA: Diagnosis not present

## 2017-05-28 DIAGNOSIS — Z823 Family history of stroke: Secondary | ICD-10-CM | POA: Insufficient documentation

## 2017-05-28 DIAGNOSIS — Z6841 Body Mass Index (BMI) 40.0 and over, adult: Secondary | ICD-10-CM | POA: Insufficient documentation

## 2017-05-28 DIAGNOSIS — D696 Thrombocytopenia, unspecified: Secondary | ICD-10-CM | POA: Insufficient documentation

## 2017-05-28 DIAGNOSIS — F329 Major depressive disorder, single episode, unspecified: Secondary | ICD-10-CM | POA: Insufficient documentation

## 2017-05-28 DIAGNOSIS — Z862 Personal history of diseases of the blood and blood-forming organs and certain disorders involving the immune mechanism: Secondary | ICD-10-CM | POA: Diagnosis not present

## 2017-05-28 DIAGNOSIS — I129 Hypertensive chronic kidney disease with stage 1 through stage 4 chronic kidney disease, or unspecified chronic kidney disease: Secondary | ICD-10-CM | POA: Diagnosis not present

## 2017-05-28 DIAGNOSIS — Z7984 Long term (current) use of oral hypoglycemic drugs: Secondary | ICD-10-CM | POA: Diagnosis not present

## 2017-05-28 DIAGNOSIS — E669 Obesity, unspecified: Secondary | ICD-10-CM | POA: Insufficient documentation

## 2017-05-28 DIAGNOSIS — Z888 Allergy status to other drugs, medicaments and biological substances status: Secondary | ICD-10-CM | POA: Insufficient documentation

## 2017-05-28 DIAGNOSIS — M1990 Unspecified osteoarthritis, unspecified site: Secondary | ICD-10-CM | POA: Diagnosis not present

## 2017-05-28 DIAGNOSIS — E785 Hyperlipidemia, unspecified: Secondary | ICD-10-CM | POA: Diagnosis not present

## 2017-05-28 DIAGNOSIS — K219 Gastro-esophageal reflux disease without esophagitis: Secondary | ICD-10-CM | POA: Diagnosis not present

## 2017-05-28 DIAGNOSIS — H9190 Unspecified hearing loss, unspecified ear: Secondary | ICD-10-CM | POA: Insufficient documentation

## 2017-05-28 DIAGNOSIS — Z79899 Other long term (current) drug therapy: Secondary | ICD-10-CM | POA: Diagnosis not present

## 2017-05-28 DIAGNOSIS — Z87891 Personal history of nicotine dependence: Secondary | ICD-10-CM | POA: Diagnosis not present

## 2017-05-28 DIAGNOSIS — C9200 Acute myeloblastic leukemia, not having achieved remission: Secondary | ICD-10-CM | POA: Diagnosis not present

## 2017-05-28 DIAGNOSIS — E1122 Type 2 diabetes mellitus with diabetic chronic kidney disease: Secondary | ICD-10-CM | POA: Insufficient documentation

## 2017-05-28 DIAGNOSIS — D46Z Other myelodysplastic syndromes: Secondary | ICD-10-CM | POA: Diagnosis present

## 2017-05-28 DIAGNOSIS — D649 Anemia, unspecified: Secondary | ICD-10-CM | POA: Diagnosis not present

## 2017-05-28 DIAGNOSIS — Z8249 Family history of ischemic heart disease and other diseases of the circulatory system: Secondary | ICD-10-CM | POA: Insufficient documentation

## 2017-05-28 DIAGNOSIS — Z87442 Personal history of urinary calculi: Secondary | ICD-10-CM | POA: Insufficient documentation

## 2017-05-28 DIAGNOSIS — C92Z0 Other myeloid leukemia not having achieved remission: Secondary | ICD-10-CM | POA: Diagnosis not present

## 2017-05-28 DIAGNOSIS — Z9181 History of falling: Secondary | ICD-10-CM | POA: Diagnosis not present

## 2017-05-28 DIAGNOSIS — F419 Anxiety disorder, unspecified: Secondary | ICD-10-CM | POA: Diagnosis not present

## 2017-05-28 DIAGNOSIS — D469 Myelodysplastic syndrome, unspecified: Secondary | ICD-10-CM | POA: Diagnosis not present

## 2017-05-28 DIAGNOSIS — Z8379 Family history of other diseases of the digestive system: Secondary | ICD-10-CM | POA: Diagnosis not present

## 2017-05-28 DIAGNOSIS — Z833 Family history of diabetes mellitus: Secondary | ICD-10-CM | POA: Insufficient documentation

## 2017-05-28 LAB — APTT: APTT: 43 s — AB (ref 24–36)

## 2017-05-28 LAB — CBC WITH DIFFERENTIAL/PLATELET
BASOS ABS: 0.1 10*3/uL (ref 0–0.1)
Basophils Relative: 2 %
EOS PCT: 1 %
Eosinophils Absolute: 0 10*3/uL (ref 0–0.7)
HCT: 25.2 % — ABNORMAL LOW (ref 35.0–47.0)
Hemoglobin: 8.5 g/dL — ABNORMAL LOW (ref 12.0–16.0)
LYMPHS ABS: 1.4 10*3/uL (ref 1.0–3.6)
Lymphocytes Relative: 47 %
MCH: 33.3 pg (ref 26.0–34.0)
MCHC: 33.8 g/dL (ref 32.0–36.0)
MCV: 98.4 fL (ref 80.0–100.0)
MONO ABS: 0.2 10*3/uL (ref 0.2–0.9)
Monocytes Relative: 7 %
NEUTROS PCT: 43 %
Neutro Abs: 1.2 10*3/uL — ABNORMAL LOW (ref 1.4–6.5)
Platelets: 45 10*3/uL — ABNORMAL LOW (ref 150–440)
RBC: 2.56 MIL/uL — AB (ref 3.80–5.20)
RDW: 20.6 % — AB (ref 11.5–14.5)
WBC: 2.9 10*3/uL — AB (ref 3.6–11.0)
nRBC: 3 /100 WBC — ABNORMAL HIGH

## 2017-05-28 LAB — PROTIME-INR
INR: 1.12
PROTHROMBIN TIME: 14.3 s (ref 11.4–15.2)

## 2017-05-28 MED ORDER — ACETAMINOPHEN 325 MG PO TABS
650.0000 mg | ORAL_TABLET | Freq: Four times a day (QID) | ORAL | Status: DC | PRN
  Administered 2017-05-28: 650 mg via ORAL
  Filled 2017-05-28 (×2): qty 2

## 2017-05-28 MED ORDER — FENTANYL CITRATE (PF) 100 MCG/2ML IJ SOLN
INTRAMUSCULAR | Status: AC
  Filled 2017-05-28: qty 4

## 2017-05-28 MED ORDER — FENTANYL CITRATE (PF) 100 MCG/2ML IJ SOLN
INTRAMUSCULAR | Status: AC | PRN
  Administered 2017-05-28: 50 ug via INTRAVENOUS
  Administered 2017-05-28: 25 ug via INTRAVENOUS

## 2017-05-28 MED ORDER — HEPARIN SOD (PORK) LOCK FLUSH 100 UNIT/ML IV SOLN
INTRAVENOUS | Status: AC
  Filled 2017-05-28: qty 5

## 2017-05-28 MED ORDER — MIDAZOLAM HCL 5 MG/5ML IJ SOLN
INTRAMUSCULAR | Status: AC
  Filled 2017-05-28: qty 5

## 2017-05-28 MED ORDER — ACETAMINOPHEN 325 MG PO TABS
ORAL_TABLET | ORAL | Status: AC
  Filled 2017-05-28: qty 2

## 2017-05-28 MED ORDER — SODIUM CHLORIDE 0.9 % IV SOLN
INTRAVENOUS | Status: DC
Start: 2017-05-28 — End: 2017-05-29
  Administered 2017-05-28: 09:00:00 via INTRAVENOUS

## 2017-05-28 MED ORDER — MIDAZOLAM HCL 5 MG/5ML IJ SOLN
INTRAMUSCULAR | Status: AC | PRN
  Administered 2017-05-28 (×2): 1 mg via INTRAVENOUS

## 2017-05-28 NOTE — Consult Note (Signed)
Chief Complaint: Patient was seen in consultation today for No chief complaint on file.  at the request of Brahmanday,Govinda R  Referring Physician(s): Brahmanday,Govinda R  Patient Status: [facility] - Out-pt  History of Present Illness: Teresa Carrillo is a 76 y.o. female with past medical history significant for diabetes, anxiety, chronic kidney disease, hypertension, hyperlipidemia obesity, thrombocytopenia and high-grade MDS who presents to the interventional radiology Department for CT-guided bone marrow biopsy for tissue diagnostic purposes. Note, patient underwent CT-guided bone marrow biopsy, by my partner, Dr. Vernard Gambles, in April of this year. Per the patient report, this is her 4th bone marrow biopsy.   The patient is accompanied by her husband and her sister though serves as her own historian.  Patient states that she recently was having an issue with bleeding gums though this has resolved found the administration of a platelet infusion last week. She currently denies excessive bruising.  The patient is otherwise without complaint. No fever or chills. No unintentional weight loss. No chest pain or shortness of breath.  Past Medical History:  Diagnosis Date  . Abdominal wall mass   . Anginal pain ([place])   . Anxiety   . Arthritis   . Calculus of kidney   . Cystitis   . Depression   . Diabetes mellitus without complication (HCC)    elevated A1c  . Dyspnea on exertion   . Elevated serum creatinine   . Fibrocystic breast disease   . GERD (gastroesophageal reflux disease)   . Hearing loss   . Heart murmur   . HTN (hypertension)   . Hyperlipidemia   . MDS (myelodysplastic syndrome), high grade ([place]) 04/23/2016  . Microscopic hematuria   . Mouth sores   . Mucositis due to chemotherapy   . Obesity   . Risk for falls   . Sleep apnea   . Thrombocytopenia ([place])   . Urinary frequency   . Urinary urgency     Past Surgical History:  Procedure Laterality Date  . ABDOMINAL  HYSTERECTOMY    . APPENDECTOMY    . CARDIAC CATHETERIZATION     x2  . CHOLECYSTECTOMY    . COLONOSCOPY N/A 02/24/2015   Procedure: COLONOSCOPY;  Surgeon: Manya Silvas, MD;  Location: [facility] ENDOSCOPY;  Service: Endoscopy;  Laterality: N/A;  . DIAGNOSTIC LAPAROSCOPY     Removal of benign abdominal tumor  . ESOPHAGOGASTRODUODENOSCOPY N/A 02/24/2015   Procedure: ESOPHAGOGASTRODUODENOSCOPY (EGD);  Surgeon: Manya Silvas, MD;  Location: [facility] ENDOSCOPY;  Service: Endoscopy;  Laterality: N/A;  . right eye surgery Right     Allergies: Macrobid [nitrofurantoin monohyd macro]  Medications: Prior to Admission medications   Medication Sig Start Date End Date Taking? Authorizing Provider  acyclovir (ZOVIRAX) 400 MG tablet TAKE 1 TABLET(400 MG) BY MOUTH TWICE DAILY 05/26/17  Yes Cammie Sickle, MD  buPROPion (WELLBUTRIN XL) 150 MG 24 hr tablet Take 300 mg by mouth daily at 12 noon.    Yes [provider]  clonazePAM (KLONOPIN) 0.5 MG tablet Take 0.5 mg by mouth 2 (two) times daily.    Yes [provider]  diltiazem (CARDIZEM CD) 180 MG 24 hr capsule Take 1 capsule (180 mg total) by mouth daily. 03/27/17 03/27/18 Yes Darel Hong, MD  magic mouthwash w/lidocaine SOLN Take 5 mLs by mouth 4 (four) times daily. 80 ml viscous lidocaine 2%, 80 ml Mylanta, 80 ml Diphenhydramine 12.5 mg/5 ml Elixir, 80 ml Nystatin 100,000 Unit suspension, 80 ml Prednisolone 15 mg/48m, 80 ml Distilled Water.  Sig: Swish/Swallow 5-10 ml four times a day as needed. Dispense 480 ml. 3RFs 10/07/16  Yes Cammie Sickle, MD  metFORMIN (GLUCOPHAGE) 500 MG tablet TAKE 1 TABLET BY MOUTH DAILY 12/28/16  Yes Lada, Satira Anis, MD  Multiple Vitamin (MULTIVITAMIN WITH MINERALS) TABS tablet Take 1 tablet by mouth daily.   Yes [provider]  prochlorperazine (COMPAZINE) 10 MG tablet Take 1 tablet (10 mg total) by mouth every 6 (six) hours as needed (Nausea or vomiting). 04/23/16  Yes Charlaine Dalton  R, MD  rosuvastatin (CRESTOR) 20 MG tablet TAKE 1 TABLET(20 MG) BY MOUTH AT BEDTIME 02/19/17  Yes Rochel Brome A, MD  sertraline (ZOLOFT) 100 MG tablet Take 100 mg by mouth daily at 12 noon.    Yes [provider]  Umeclidinium-Vilanterol (ANORO ELLIPTA) 62.5-25 MCG/INH AEPB Inhale 1 puff into the lungs daily as needed (shortness of breath).  05/17/14  Yes [provider]  hydrocortisone (ANUSOL-HC) 2.5 % rectal cream Place 1 application rectally 2 (two) times daily as needed for hemorrhoids or itching. 05/10/16   Henreitta Leber, MD  lisinopril (PRINIVIL,ZESTRIL) 2.5 MG tablet Take 1 tablet (2.5 mg total) by mouth daily. 01/24/17   Rochel Brome A, MD  LUMIGAN 0.01 % SOLN Place 1 drop into both eyes at bedtime.  04/12/15   [provider]  Omega-3 Fatty Acids (FISH OIL) 1000 MG CAPS Take 1 capsule by mouth daily.    [provider]  ondansetron (ZOFRAN-ODT) 4 MG disintegrating tablet Take 4 mg by mouth every 8 (eight) hours as needed for nausea or vomiting.    [provider]  polyethylene glycol (MIRALAX / GLYCOLAX) packet Take 17 g by mouth daily as needed for mild constipation. 05/10/16   Henreitta Leber, MD     Family History  Problem Relation Age of Onset  . Congestive Heart Failure Mother   . Diabetes Mother   . Coronary artery disease Mother   . Stroke Mother   . Cirrhosis Father     Social History   Social History  . Marital status: Married    Spouse name: N/A  . Number of children: N/A  . Years of education: N/A   Social History Main Topics  . Smoking status: Former Smoker    Quit date: 09/09/1988  . Smokeless tobacco: Never Used     Comment: quit 25 years ago  . Alcohol use No  . Drug use: No  . Sexual activity: Not Asked   Other Topics Concern  . None   Social History Narrative  . None    ECOG Status: 1 - Symptomatic but completely ambulatory  Review of Systems: A 12 point ROS discussed and pertinent positives are  indicated in the HPI above.  All other systems are negative.  Review of Systems  Constitutional: Negative for activity change, fatigue and fever.  Respiratory: Negative.   Cardiovascular: Negative.   Gastrointestinal: Negative.   Hematological: Does not bruise/bleed easily.    Vital Signs: BP (!) 153/53   Temp 99.3 F (37.4 C)   Resp 20   Ht 5' 3" (1.6 m)   Wt 237 lb (107.5 kg)   SpO2 96%   BMI 41.98 kg/m   Physical Exam  Constitutional: She appears well-developed and well-nourished.  Cardiovascular: Normal rate and regular rhythm.   Pulmonary/Chest: Effort normal and breath sounds normal.  Psychiatric: She has a normal mood and affect. Her behavior is normal.  Nursing note and vitals reviewed.  Imaging: No results found.  Labs:  CBC:  Recent Labs  05/05/17 1139 05/13/17 0956 05/19/17 1154 05/26/17 1159  WBC 1.9* 2.9* 2.5* 2.7*  HGB 8.8* 8.7* 8.0* 8.1*  HCT 25.5* 24.9* 23.1* 23.7*  PLT 109* 48* 26* 58*    COAGS:  Recent Labs  08/14/16 0805 10/14/16 1106  INR 1.19 1.01  APTT 42* 38*    BMP:  Recent Labs  04/07/17 1135 04/14/17 1136 04/21/17 1200 04/28/17 0929  NA 139 138 142 143  K 4.8 4.5 4.6 4.9  CL 106 105 109 110  CO2 _0 GLUCOSE 131* 108* 140* 130*  BUN 24* 23* 19 26*  CALCIUM 9.2 9.1 9.1 9.2  CREATININE 1.26* 1.11* 1.19* 1.41*  GFRNONAA 41* 47* 44* 35*  GFRAA 47* 55* 50* 41*    LIVER FUNCTION TESTS:  Recent Labs  04/07/17 1135 04/14/17 1136 04/21/17 1200 04/28/17 0929  BILITOT 0.4 0.5 0.5 0.5  AST _1 ALT 18 15 12* 16  ALKPHOS 76 78 67 70  PROT 7.3 7.4 7.1 7.5  ALBUMIN 3.9 3.8 3.8 3.9    TUMOR MARKERS: No results for input(s): AFPTM, CEA, CA199, CHROMGRNA in the last 8760 hours.  Assessment and Plan:  RACHELANNE WHIDBY is a 76 y.o. female with past medical history significant for diabetes, anxiety, chronic kidney disease, hypertension, hyperlipidemia obesity, thrombocytopenia and high-grade MDS who  presents to the interventional radiology Department for CT-guided bone marrow biopsy for tissue diagnostic purposes. Note, patient underwent CT-guided bone marrow biopsy, by my partner, Dr. Vernard Gambles, in April of this year. Per the patient report, this is her 4th bone marrow biopsy.   She is currently without complaint.  Risks and benefits of CT guided BM Bx were discussed with the patient including, but not limited to bleeding, infection, damage to adjacent structures or low yield requiring additional tests.  All of the patient's questions were answered, patient is agreeable to proceed.  Consent signed and in chart.  Thank you for this interesting consult.  I greatly enjoyed meeting Teresa Carrillo and look forward to participating in their care.  A copy of this report was sent to the requesting provider on this date.  Electronically Signed: Sandi Mariscal, MD 05/28/2017, 8:59 AM   I spent a total of 15 Minutes in face to face in clinical consultation, greater than 50% of which was counseling/coordinating care for CT guided BM Bx.

## 2017-05-28 NOTE — Procedures (Signed)
Pre-procedure Diagnosis: MDS Post-procedure Diagnosis: Same  Technically successful CT guided bone marrow aspiration and biopsy of left iliac crest.   Complications: None Immediate  EBL: None  SignedSandi Mariscal Pager: [phone] 05/28/2017, 10:00 AM

## 2017-05-29 ENCOUNTER — Telehealth: Payer: Self-pay | Admitting: Internal Medicine

## 2017-05-29 NOTE — Telephone Encounter (Signed)
FYI- H/B- Pt has progressed to AML as per discuss with Dr.Smir; discussed with Dr.Foster. Plan follow up at [facility] for peripheral blood NGS. Will discuss with pt at next visit on 9/24.

## 2017-06-02 ENCOUNTER — Inpatient Hospital Stay: Payer: Medicare Other

## 2017-06-02 ENCOUNTER — Inpatient Hospital Stay: Payer: Medicare Other | Admitting: *Deleted

## 2017-06-02 ENCOUNTER — Inpatient Hospital Stay (HOSPITAL_BASED_OUTPATIENT_CLINIC_OR_DEPARTMENT_OTHER): Payer: Medicare Other | Admitting: Internal Medicine

## 2017-06-02 VITALS — BP 142/63 | HR 78 | Temp 97.9°F | Resp 16 | Wt 237.4 lb

## 2017-06-02 DIAGNOSIS — D4622 Refractory anemia with excess of blasts 2: Secondary | ICD-10-CM

## 2017-06-02 DIAGNOSIS — E669 Obesity, unspecified: Secondary | ICD-10-CM

## 2017-06-02 DIAGNOSIS — E1122 Type 2 diabetes mellitus with diabetic chronic kidney disease: Secondary | ICD-10-CM | POA: Diagnosis not present

## 2017-06-02 DIAGNOSIS — I129 Hypertensive chronic kidney disease with stage 1 through stage 4 chronic kidney disease, or unspecified chronic kidney disease: Secondary | ICD-10-CM | POA: Diagnosis not present

## 2017-06-02 DIAGNOSIS — Z881 Allergy status to other antibiotic agents status: Secondary | ICD-10-CM

## 2017-06-02 DIAGNOSIS — M1990 Unspecified osteoarthritis, unspecified site: Secondary | ICD-10-CM

## 2017-06-02 DIAGNOSIS — Z87891 Personal history of nicotine dependence: Secondary | ICD-10-CM | POA: Diagnosis not present

## 2017-06-02 DIAGNOSIS — F418 Other specified anxiety disorders: Secondary | ICD-10-CM | POA: Diagnosis not present

## 2017-06-02 DIAGNOSIS — Z7984 Long term (current) use of oral hypoglycemic drugs: Secondary | ICD-10-CM | POA: Diagnosis not present

## 2017-06-02 DIAGNOSIS — N183 Chronic kidney disease, stage 3 (moderate): Secondary | ICD-10-CM | POA: Diagnosis not present

## 2017-06-02 DIAGNOSIS — K219 Gastro-esophageal reflux disease without esophagitis: Secondary | ICD-10-CM | POA: Diagnosis not present

## 2017-06-02 DIAGNOSIS — Z79899 Other long term (current) drug therapy: Secondary | ICD-10-CM | POA: Diagnosis not present

## 2017-06-02 DIAGNOSIS — E785 Hyperlipidemia, unspecified: Secondary | ICD-10-CM

## 2017-06-02 DIAGNOSIS — Z87442 Personal history of urinary calculi: Secondary | ICD-10-CM

## 2017-06-02 DIAGNOSIS — D46Z Other myelodysplastic syndromes: Secondary | ICD-10-CM

## 2017-06-02 DIAGNOSIS — Z792 Long term (current) use of antibiotics: Secondary | ICD-10-CM

## 2017-06-02 DIAGNOSIS — C9202 Acute myeloblastic leukemia, in relapse: Secondary | ICD-10-CM

## 2017-06-02 DIAGNOSIS — C9200 Acute myeloblastic leukemia, not having achieved remission: Secondary | ICD-10-CM | POA: Insufficient documentation

## 2017-06-02 DIAGNOSIS — R231 Pallor: Secondary | ICD-10-CM

## 2017-06-02 LAB — CBC WITH DIFFERENTIAL/PLATELET
BASOS ABS: 0.1 10*3/uL (ref 0–0.1)
Basophils Relative: 2 %
Eosinophils Absolute: 0 10*3/uL (ref 0–0.7)
Eosinophils Relative: 1 %
HCT: 24.7 % — ABNORMAL LOW (ref 35.0–47.0)
Hemoglobin: 8.5 g/dL — ABNORMAL LOW (ref 12.0–16.0)
LYMPHS PCT: 73 %
Lymphs Abs: 1.9 10*3/uL (ref 1.0–3.6)
MCH: 34.3 pg — ABNORMAL HIGH (ref 26.0–34.0)
MCHC: 34.6 g/dL (ref 32.0–36.0)
MCV: 99.1 fL (ref 80.0–100.0)
MONOS PCT: 4 %
Monocytes Absolute: 0.1 10*3/uL — ABNORMAL LOW (ref 0.2–0.9)
NEUTROS ABS: 0.5 10*3/uL — AB (ref 1.4–6.5)
NEUTROS PCT: 20 %
PLATELETS: 83 10*3/uL — AB (ref 150–440)
RBC: 2.49 MIL/uL — AB (ref 3.80–5.20)
RDW: 20.6 % — AB (ref 11.5–14.5)
WBC: 2.6 10*3/uL — AB (ref 3.6–11.0)

## 2017-06-02 LAB — COMPREHENSIVE METABOLIC PANEL
ALBUMIN: 3.8 g/dL (ref 3.5–5.0)
ALT: 20 U/L (ref 14–54)
ANION GAP: 8 (ref 5–15)
AST: 21 U/L (ref 15–41)
Alkaline Phosphatase: 84 U/L (ref 38–126)
BILIRUBIN TOTAL: 0.5 mg/dL (ref 0.3–1.2)
BUN: 22 mg/dL — ABNORMAL HIGH (ref 6–20)
CO2: 26 mmol/L (ref 22–32)
Calcium: 9.5 mg/dL (ref 8.9–10.3)
Chloride: 106 mmol/L (ref 101–111)
Creatinine, Ser: 1.26 mg/dL — ABNORMAL HIGH (ref 0.44–1.00)
GFR calc non Af Amer: 41 mL/min — ABNORMAL LOW (ref >60)
GFR, EST AFRICAN AMERICAN: 47 mL/min — AB (ref >60)
GLUCOSE: 117 mg/dL — AB (ref 65–99)
POTASSIUM: 4.3 mmol/L (ref 3.5–5.1)
Sodium: 140 mmol/L (ref 135–145)
TOTAL PROTEIN: 7.5 g/dL (ref 6.5–8.1)

## 2017-06-02 MED ORDER — LEVOFLOXACIN 500 MG PO TABS: 500.0000 mg | ORAL_TABLET | Freq: Every day | ORAL | 0 refills | Status: DC

## 2017-06-02 NOTE — Assessment & Plan Note (Signed)
Refractory anemia- with excess blasts- II [blasts percentage 14%] FISH- normal karyotype. On vidaza; s/p  cycle # 8; on April 23rd 2018- BMX- Partial response noted [persistent high grade MDS; aspirate -sub-optimal for blasts evaluation; 90% hyper-cellular bone marrow; dysplastic changes].   # proceed with treatment #12 today; platelets- 81; Labs today reviewed; hemoglobin 8.9. [C discussion below]  # Thrombocytopenia/anemia- likely combination of underlying MDS and secondary to [place]-  Continue treatments every 5 weeks [currently reduced by 20% and also 5 treatment each cycle]. Will plan BMBX after this cycle.   # Gingival inflammation/# Gum bleeding- continue salt/baking soda rinses.  recommend STOPPING asprin.   #CKD stage III-stable.  # Prophylactic antibiotics- acyclovir/ diflucan.   # follow up in 5 weeks sep 24th/ labs weekly; BMBx-in 4 weeks.

## 2017-06-02 NOTE — Assessment & Plan Note (Addendum)
Bone marrow biopsy September 2018 shows- ACUTE MYELOID LEUKEMIA [38% blasts]; and also the peripheral blood. Discussed with patient/family in detail the serious implications of this progression of disease. Discussed that high-grade MDS has finally progressed to acute myeloid leukemia; that the [place] treatments are not helping her anymore.  # Discussed the treatment options include induction chemotherapy [toxic to the patient; needing long hospitalization; risk of infections]; versus possibility of clinical trials versus best supportive care [transfusions; palliation is needed]. Patient/family reluctant with any aggressive treatment options.    # Today hemoglobin is 8.5 white count 2.3 absolute neutrophil count of 500 platelets 85. Hold Vidaza given the progression of disease.    #CKD stage III-stable.  # Prophylactic antibiotics- acyclovir/ diflucan/ add levaquin today.   # weekly cbc/ hold tube; follow up in 4 weeks/labs.Discussed with Dr. Royce Macadamia at [facility]. He plans to get peripheral blood NGS to evaluate her for possible clinical trials. Recommended the patient call Dr. Luis Abed office today for the blood draw; follow-up appointment.   # 40 minutes face-to-face with the patient discussing the above plan of care; more than 50% of time spent on prognosis/ natural history; counseling and coordination.

## 2017-06-02 NOTE — Progress Notes (Signed)
[place] NOTE  Patient Care Team: Roselee Nova, [name]  CHIEF COMPLAINTS/PURPOSE OF CONSULTATION:   Oncology History   #JUNE 2017- Severe neutropenia/ Anemia ~hb 9/platlets- 85-100 AUG 2017- REFRACTORY ANEMIA with EXCESS BLASTS [14% blasts- BMBx]; cytogenetics/FISH-N [SNP micorarray- not done]   # AUG 21st-  START AZA 35m/m2 day- 1-7 q 28 days x4 cycles; DEC 6th- BMBx- <5% blasts; hypercellular with dysplasia.   # SEP 20th 2018- ACUTE MYELOID LEUKEMIA [38% blasts on BMBx]  # CKD stage III     MDS (myelodysplastic syndrome), high grade ([facility]   04/23/2016 Initial Diagnosis    MDS (myelodysplastic syndrome), high grade (HCC)       Acute myeloid leukemia in relapse ([place]   06/02/2017 Initial Diagnosis    Acute myeloid leukemia in relapse (HCC)        HISTORY OF PRESENTING ILLNESS:  [name]89y.o.  female with above history of  MDS/high-grade currently started on Vidaza Status post cycle #12 appx 5 weeks ago is here for follow-up/ Review the results of the bone marrow biopsy.  Patient last received blood transfusion approximately a month ago; also received a transfusion for a platelet count of 20s- because of gum bleeding- again a month ago.  Denies any fevers. Appetite is fair. No weight loss. Denies any swelling in the legs. Chronic mild fatigue not any worse.  Today she is walking herself. Denies any abdominal pain or diarrhea.  ROS: A complete 10 point review of system is done which is negative except mentioned above in history of present illness.  MEDICAL HISTORY:  Past Medical History:  Diagnosis Date  . Abdominal wall mass   . Anginal pain ([place]   . Anxiety   . Arthritis   . Calculus of kidney   . Cystitis   . Depression   . Diabetes mellitus without complication (HCC)    elevated A1c  . Dyspnea on exertion   . Elevated serum creatinine   . Fibrocystic breast disease   . GERD  (gastroesophageal reflux disease)   . Hearing loss   . Heart murmur   . HTN (hypertension)   . Hyperlipidemia   . MDS (myelodysplastic syndrome), high grade ([place] 04/23/2016  . Microscopic hematuria   . Mouth sores   . Mucositis due to chemotherapy   . Obesity   . Risk for falls   . Sleep apnea   . Thrombocytopenia ([place]   . Urinary frequency   . Urinary urgency     SURGICAL HISTORY: Past Surgical History:  Procedure Laterality Date  . ABDOMINAL HYSTERECTOMY    . APPENDECTOMY    . CARDIAC CATHETERIZATION     x2  . CHOLECYSTECTOMY    . COLONOSCOPY N/A 02/24/2015   Procedure: COLONOSCOPY;  Surgeon: RManya Silvas MD;  [facility];  Service: Endoscopy;  Laterality: N/A;  . DIAGNOSTIC LAPAROSCOPY     Removal of benign abdominal tumor  . ESOPHAGOGASTRODUODENOSCOPY N/A 02/24/2015   Procedure: ESOPHAGOGASTRODUODENOSCOPY (EGD);  Surgeon: RManya Silvas MD;  [facility];  Service: Endoscopy;  Laterality: N/A;  . right eye surgery Right     SOCIAL HISTORY: [place] family; [place] No smoking/ no alcohol.  Social History   Social History  . Marital status: Married    Spouse name: N/A  . Number of children: N/A  . Years of education: N/A   Occupational History  . Not on  file.   Social History Main Topics  . Smoking status: Former Smoker    Quit date: 09/09/1988  . Smokeless tobacco: Never Used     Comment: quit 25 years ago  . Alcohol use No  . Drug use: No  . Sexual activity: Not on file   Other Topics Concern  . Not on file   Social History Narrative  . No narrative on file    FAMILY HISTORY: no cancers in family.  Family History  Problem Relation Age of Onset  . Congestive Heart Failure Mother   . Diabetes Mother   . Coronary artery disease Mother   . Stroke Mother   . Cirrhosis Father     ALLERGIES:  is allergic to macrobid [nitrofurantoin monohyd macro].  MEDICATIONS:  Current Outpatient  Prescriptions  Medication Sig Dispense Refill  . acyclovir (ZOVIRAX) 400 MG tablet TAKE 1 TABLET(400 MG) BY MOUTH TWICE DAILY 180 tablet 0  . buPROPion (WELLBUTRIN XL) 150 MG 24 hr tablet Take 300 mg by mouth daily at 12 noon.     . clonazePAM (KLONOPIN) 0.5 MG tablet Take 0.5 mg by mouth 2 (two) times daily.     Marland Kitchen diltiazem (CARDIZEM CD) 180 MG 24 hr capsule Take 1 capsule (180 mg total) by mouth daily. 30 capsule 0  . hydrocortisone (ANUSOL-HC) 2.5 % rectal cream Place 1 application rectally 2 (two) times daily as needed for hemorrhoids or itching. 30 g 0  . lisinopril (PRINIVIL,ZESTRIL) 2.5 MG tablet Take 1 tablet (2.5 mg total) by mouth daily. 90 tablet 0  . LUMIGAN 0.01 % SOLN Place 1 drop into both eyes at bedtime.     . magic mouthwash w/lidocaine SOLN Take 5 mLs by mouth 4 (four) times daily. 80 ml viscous lidocaine 2%, 80 ml Mylanta, 80 ml Diphenhydramine 12.5 mg/5 ml Elixir, 80 ml Nystatin 100,000 Unit suspension, 80 ml Prednisolone 15 mg/86m, 80 ml Distilled Water.  Sig: Swish/Swallow 5-10 ml four times a day as needed. Dispense 480 ml. 3RFs 480 mL 3  . metFORMIN (GLUCOPHAGE) 500 MG tablet TAKE 1 TABLET BY MOUTH DAILY 90 tablet 0  . Multiple Vitamin (MULTIVITAMIN WITH MINERALS) TABS tablet Take 1 tablet by mouth daily.    . Omega-3 Fatty Acids (FISH OIL) 1000 MG CAPS Take 1 capsule by mouth daily.    . ondansetron (ZOFRAN-ODT) 4 MG disintegrating tablet Take 4 mg by mouth every 8 (eight) hours as needed for nausea or vomiting.    . polyethylene glycol (MIRALAX / GLYCOLAX) packet Take 17 g by mouth daily as needed for mild constipation. 14 each 1  . prochlorperazine (COMPAZINE) 10 MG tablet Take 1 tablet (10 mg total) by mouth every 6 (six) hours as needed (Nausea or vomiting). 30 tablet 1  . rosuvastatin (CRESTOR) 20 MG tablet TAKE 1 TABLET(20 MG) BY MOUTH AT BEDTIME 90 tablet 0  . sertraline (ZOLOFT) 100 MG tablet Take 100 mg by mouth daily at 12 noon.     .Marland KitchenUmeclidinium-Vilanterol  (ANORO ELLIPTA) 62.5-25 MCG/INH AEPB Inhale 1 puff into the lungs daily as needed (shortness of breath).     .Marland Kitchenlevofloxacin (LEVAQUIN) 500 MG tablet Take 1 tablet (500 mg total) by mouth daily. 30 tablet 0   No current facility-administered medications for this visit.       .Marland Kitchen PHYSICAL EXAMINATION: ECOG PERFORMANCE STATUS: 1 - Symptomatic but completely ambulatory  Vitals:   06/02/17 0915 06/02/17 0917  BP:  (!) 142/63  Pulse:  78  Resp:  16 16  Temp: 97.9 F (36.6 C)    Filed Weights   06/02/17 0917  Weight: 237 lb 6.4 oz (107.7 kg)    GENERAL: Well-nourished well-developed; Alert, no distress and comfortable.   Obese. Accompanied by her husband/sister. She is walking by herself. EYES: Positive for pallor. OROPHARYNX: no thrush or ulceration;  NECK: supple, no masses felt LYMPH:  no palpable lymphadenopathy in the cervical, axillary or inguinal regions LUNGS: clear to auscultation and  No wheeze or crackles HEART/CVS: regular rate & rhythm and no murmurs; No lower extremity edema ABDOMEN: abdomen soft, non-tender and normal bowel sounds Musculoskeletal:no cyanosis of digits and no clubbing  PSYCH: alert & oriented x 3 with fluent speech NEURO: no focal motor/sensory deficits SKIN:  No skin rash/nodules.  LABORATORY DATA:  I have reviewed the data as listed Lab Results  Component Value Date   WBC 2.6 (L) 06/02/2017   HGB 8.5 (L) 06/02/2017   HCT 24.7 (L) 06/02/2017   MCV 99.1 06/02/2017   PLT 83 (L) 06/02/2017    Recent Labs  04/21/17 1200 04/28/17 0929 06/02/17 0828  NA 142 143 140  K 4.6 4.9 4.3  CL 109 110 106  CO2 _0 GLUCOSE 140* 130* 117*  BUN 19 26* 22*  CREATININE 1.19* 1.41* 1.26*  CALCIUM 9.1 9.2 9.5  GFRNONAA 44* 35* 41*  GFRAA 50* 41* 47*  PROT 7.1 7.5 7.5  ALBUMIN 3.8 3.9 3.8  AST _1 ALT 12* 16 20  ALKPHOS 67 70 84  BILITOT 0.5 0.5 0.5    RADIOGRAPHIC STUDIES: I have personally reviewed the radiological images as listed  and agreed with the findings in the report. Ct Biopsy  Result Date: 05/28/2017 INDICATION: History of MDS. Please perform CT-guided bone marrow biopsy for tissue diagnostic purposes. EXAM: CT-GUIDED BONE MARROW BIOPSY AND ASPIRATION MEDICATIONS: None ANESTHESIA/SEDATION: Fentanyl 75 mcg IV; Versed 2 mg IV Sedation Time: 18 minutes; The patient was continuously monitored during the procedure by the interventional radiology nurse under my direct supervision. COMPLICATIONS: None immediate. PROCEDURE: Informed consent was obtained from the patient following an explanation of the procedure, risks, benefits and alternatives. The patient understands, agrees and consents for the procedure. All questions were addressed. A time out was performed prior to the initiation of the procedure. The patient was positioned prone and non-contrast localization CT was performed of the pelvis to demonstrate the iliac marrow spaces. The operative site was prepped and draped in the usual sterile fashion. Under sterile conditions and local anesthesia, a 22 gauge spinal needle was utilized for procedural planning. Next, an 11 gauge coaxial bone biopsy needle was advanced into the left iliac marrow space. Needle position was confirmed with CT imaging. Initially, bone marrow aspiration was performed. Next, a bone marrow biopsy was obtained with the 11 gauge outer bone marrow device. Samples were prepared with the cytotechnologist and deemed adequate. The needle was removed intact. Hemostasis was obtained with compression and a dressing was placed. The patient tolerated the procedure well without immediate post procedural complication. IMPRESSION: Successful CT guided left iliac bone marrow aspiration and core biopsy. Electronically Signed   By: Sandi Mariscal M.D.   On: 05/28/2017 10:05    ASSESSMENT & PLAN:   MDS (myelodysplastic syndrome), high grade (HCC) Refractory anemia- with excess blasts- II [blasts percentage 14%] FISH- normal  karyotype. On vidaza; s/p  cycle # 8; on April 23rd 2018- BMX- Partial response noted [persistent high grade MDS; aspirate -sub-optimal for  blasts evaluation; 90% hyper-cellular bone marrow; dysplastic changes].   # proceed with treatment #12 today; platelets- 81; Labs today reviewed; hemoglobin 8.9. [C discussion below]  # Thrombocytopenia/anemia- likely combination of underlying MDS and secondary to [place]-  Continue treatments every 5 weeks [currently reduced by 20% and also 5 treatment each cycle]. Will plan BMBX after this cycle.   # Gingival inflammation/# Gum bleeding- continue salt/baking soda rinses.  recommend STOPPING asprin.   #CKD stage III-stable.  # Prophylactic antibiotics- acyclovir/ diflucan.   # follow up in 5 weeks sep 24th/ labs weekly; BMBx-in 4 weeks.   Acute myeloid leukemia in relapse ([place]) Bone marrow biopsy September 2018 shows- ACUTE MYELOID LEUKEMIA [38% blasts]; and also the peripheral blood. Discussed with patient/family in detail the serious implications of this progression of disease. Discussed that high-grade MDS has finally progressed to acute myeloid leukemia; that the [place] treatments are not helping her anymore.  # Discussed the treatment options include induction chemotherapy [toxic to the patient; needing long hospitalization; risk of infections]; versus possibility of clinical trials versus best supportive care [transfusions; palliation is needed]. Patient/family reluctant with any aggressive treatment options.    # Today hemoglobin is 8.5 white count 2.3 absolute neutrophil count of 500 platelets 85. Hold Vidaza given the progression of disease.    #CKD stage III-stable.  # Prophylactic antibiotics- acyclovir/ diflucan/ add levaquin today.   # weekly cbc/ hold tube; follow up in 4 weeks/labs.Discussed with Dr. Royce Macadamia at [facility]. He plans to get peripheral blood NGS to evaluate her for possible clinical trials. Recommended the patient call Dr. Luis Abed  office today for the blood draw; follow-up appointment.      Cammie Sickle, MD 06/02/2017 10:19 AM

## 2017-06-02 NOTE — Progress Notes (Signed)
Patient is here today for a follow up.

## 2017-06-03 ENCOUNTER — Inpatient Hospital Stay: Payer: Medicare Other

## 2017-06-04 ENCOUNTER — Inpatient Hospital Stay: Payer: Medicare Other

## 2017-06-05 ENCOUNTER — Inpatient Hospital Stay: Payer: Medicare Other

## 2017-06-05 DIAGNOSIS — H209 Unspecified iridocyclitis: Secondary | ICD-10-CM | POA: Diagnosis not present

## 2017-06-06 ENCOUNTER — Inpatient Hospital Stay: Payer: Medicare Other

## 2017-06-09 ENCOUNTER — Inpatient Hospital Stay: Payer: Medicare Other | Attending: Internal Medicine

## 2017-06-09 ENCOUNTER — Inpatient Hospital Stay: Payer: Medicare Other

## 2017-06-09 ENCOUNTER — Other Ambulatory Visit: Payer: Self-pay | Admitting: *Deleted

## 2017-06-09 DIAGNOSIS — E785 Hyperlipidemia, unspecified: Secondary | ICD-10-CM | POA: Diagnosis not present

## 2017-06-09 DIAGNOSIS — K219 Gastro-esophageal reflux disease without esophagitis: Secondary | ICD-10-CM | POA: Insufficient documentation

## 2017-06-09 DIAGNOSIS — E669 Obesity, unspecified: Secondary | ICD-10-CM | POA: Diagnosis not present

## 2017-06-09 DIAGNOSIS — C9202 Acute myeloblastic leukemia, in relapse: Secondary | ICD-10-CM | POA: Diagnosis not present

## 2017-06-09 DIAGNOSIS — Z7984 Long term (current) use of oral hypoglycemic drugs: Secondary | ICD-10-CM | POA: Diagnosis not present

## 2017-06-09 DIAGNOSIS — Z87891 Personal history of nicotine dependence: Secondary | ICD-10-CM | POA: Diagnosis not present

## 2017-06-09 DIAGNOSIS — Z79899 Other long term (current) drug therapy: Secondary | ICD-10-CM | POA: Diagnosis not present

## 2017-06-09 DIAGNOSIS — D649 Anemia, unspecified: Secondary | ICD-10-CM

## 2017-06-09 DIAGNOSIS — N183 Chronic kidney disease, stage 3 (moderate): Secondary | ICD-10-CM | POA: Insufficient documentation

## 2017-06-09 DIAGNOSIS — E1122 Type 2 diabetes mellitus with diabetic chronic kidney disease: Secondary | ICD-10-CM | POA: Diagnosis not present

## 2017-06-09 DIAGNOSIS — I129 Hypertensive chronic kidney disease with stage 1 through stage 4 chronic kidney disease, or unspecified chronic kidney disease: Secondary | ICD-10-CM | POA: Insufficient documentation

## 2017-06-09 DIAGNOSIS — D4622 Refractory anemia with excess of blasts 2: Secondary | ICD-10-CM | POA: Insufficient documentation

## 2017-06-09 DIAGNOSIS — D46Z Other myelodysplastic syndromes: Secondary | ICD-10-CM

## 2017-06-09 DIAGNOSIS — Z881 Allergy status to other antibiotic agents status: Secondary | ICD-10-CM | POA: Diagnosis not present

## 2017-06-09 LAB — CBC WITH DIFFERENTIAL/PLATELET
BASOS ABS: 0 10*3/uL (ref 0–0.1)
BASOS PCT: 0 %
EOS PCT: 0 %
Eosinophils Absolute: 0 10*3/uL (ref 0–0.7)
HCT: 22.7 % — ABNORMAL LOW (ref 35.0–47.0)
HEMOGLOBIN: 7.9 g/dL — AB (ref 12.0–16.0)
LYMPHS ABS: 1.5 10*3/uL (ref 1.0–3.6)
Lymphocytes Relative: 56 %
MCH: 34.2 pg — AB (ref 26.0–34.0)
MCHC: 34.6 g/dL (ref 32.0–36.0)
MCV: 98.8 fL (ref 80.0–100.0)
Monocytes Absolute: 0.3 10*3/uL (ref 0.2–0.9)
Monocytes Relative: 10 %
NEUTROS ABS: 0.8 10*3/uL — AB (ref 1.4–6.5)
NEUTROS PCT: 31 %
Other: 3 %
Platelets: 100 10*3/uL — ABNORMAL LOW (ref 150–440)
RBC: 2.3 MIL/uL — ABNORMAL LOW (ref 3.80–5.20)
RDW: 19.7 % — ABNORMAL HIGH (ref 11.5–14.5)
SMEAR REVIEW: DECREASED
WBC: 2.6 10*3/uL — ABNORMAL LOW (ref 3.6–11.0)

## 2017-06-09 LAB — PREPARE RBC (CROSSMATCH)

## 2017-06-09 LAB — SAMPLE TO BLOOD BANK

## 2017-06-09 LAB — PATHOLOGIST SMEAR REVIEW

## 2017-06-10 ENCOUNTER — Inpatient Hospital Stay: Payer: Medicare Other

## 2017-06-10 DIAGNOSIS — D649 Anemia, unspecified: Secondary | ICD-10-CM

## 2017-06-10 DIAGNOSIS — E785 Hyperlipidemia, unspecified: Secondary | ICD-10-CM | POA: Diagnosis not present

## 2017-06-10 DIAGNOSIS — C9202 Acute myeloblastic leukemia, in relapse: Secondary | ICD-10-CM | POA: Diagnosis not present

## 2017-06-10 DIAGNOSIS — Z79899 Other long term (current) drug therapy: Secondary | ICD-10-CM | POA: Diagnosis not present

## 2017-06-10 DIAGNOSIS — K219 Gastro-esophageal reflux disease without esophagitis: Secondary | ICD-10-CM | POA: Diagnosis not present

## 2017-06-10 DIAGNOSIS — D4622 Refractory anemia with excess of blasts 2: Secondary | ICD-10-CM | POA: Diagnosis not present

## 2017-06-10 DIAGNOSIS — N183 Chronic kidney disease, stage 3 (moderate): Secondary | ICD-10-CM | POA: Diagnosis not present

## 2017-06-10 DIAGNOSIS — I129 Hypertensive chronic kidney disease with stage 1 through stage 4 chronic kidney disease, or unspecified chronic kidney disease: Secondary | ICD-10-CM | POA: Diagnosis not present

## 2017-06-10 DIAGNOSIS — Z881 Allergy status to other antibiotic agents status: Secondary | ICD-10-CM | POA: Diagnosis not present

## 2017-06-10 DIAGNOSIS — E1122 Type 2 diabetes mellitus with diabetic chronic kidney disease: Secondary | ICD-10-CM | POA: Diagnosis not present

## 2017-06-10 DIAGNOSIS — Z7984 Long term (current) use of oral hypoglycemic drugs: Secondary | ICD-10-CM | POA: Diagnosis not present

## 2017-06-10 DIAGNOSIS — Z87891 Personal history of nicotine dependence: Secondary | ICD-10-CM | POA: Diagnosis not present

## 2017-06-10 MED ORDER — ACETAMINOPHEN 325 MG PO TABS
650.0000 mg | ORAL_TABLET | Freq: Once | ORAL | Status: AC
  Administered 2017-06-10: 650 mg via ORAL
  Filled 2017-06-10: qty 2

## 2017-06-10 MED ORDER — SODIUM CHLORIDE 0.9 % IV SOLN
INTRAVENOUS | Status: DC
Start: 2017-06-10 — End: 2017-06-10
  Administered 2017-06-10: 10:00:00 via INTRAVENOUS
  Filled 2017-06-10: qty 1000

## 2017-06-10 MED ORDER — DIPHENHYDRAMINE HCL 25 MG PO CAPS
25.0000 mg | ORAL_CAPSULE | Freq: Once | ORAL | Status: AC
  Administered 2017-06-10: 25 mg via ORAL
  Filled 2017-06-10: qty 1

## 2017-06-11 ENCOUNTER — Telehealth: Payer: Self-pay | Admitting: Internal Medicine

## 2017-06-11 ENCOUNTER — Ambulatory Visit
Admission: RE | Admit: 2017-06-11 | Discharge: 2017-06-11 | Disposition: A | Discharge status: Home / Self Care | Payer: MEDICARE

## 2017-06-11 ENCOUNTER — Ambulatory Visit
Admission: RE | Admit: 2017-06-11 | Discharge: 2017-06-11 | Disposition: A | Discharge status: Home / Self Care | Payer: MEDICARE | Attending: Internal Medicine | Admitting: Internal Medicine

## 2017-06-11 DIAGNOSIS — I480 Paroxysmal atrial fibrillation: Principal | ICD-10-CM

## 2017-06-11 DIAGNOSIS — D46Z Other myelodysplastic syndromes: Principal | ICD-10-CM

## 2017-06-11 DIAGNOSIS — C9200 Acute myeloblastic leukemia, not having achieved remission: Secondary | ICD-10-CM

## 2017-06-11 DIAGNOSIS — F410 Panic disorder [episodic paroxysmal anxiety] without agoraphobia: Secondary | ICD-10-CM

## 2017-06-11 DIAGNOSIS — I10 Essential (primary) hypertension: Secondary | ICD-10-CM | POA: Diagnosis not present

## 2017-06-11 DIAGNOSIS — Z9221 Personal history of antineoplastic chemotherapy: Secondary | ICD-10-CM | POA: Diagnosis not present

## 2017-06-11 DIAGNOSIS — Z79899 Other long term (current) drug therapy: Secondary | ICD-10-CM | POA: Diagnosis not present

## 2017-06-11 DIAGNOSIS — E119 Type 2 diabetes mellitus without complications: Secondary | ICD-10-CM | POA: Diagnosis not present

## 2017-06-11 DIAGNOSIS — E785 Hyperlipidemia, unspecified: Secondary | ICD-10-CM | POA: Diagnosis not present

## 2017-06-11 DIAGNOSIS — J449 Chronic obstructive pulmonary disease, unspecified: Secondary | ICD-10-CM | POA: Diagnosis not present

## 2017-06-11 DIAGNOSIS — C9202 Acute myeloblastic leukemia, in relapse: Secondary | ICD-10-CM | POA: Diagnosis not present

## 2017-06-11 DIAGNOSIS — I2510 Atherosclerotic heart disease of native coronary artery without angina pectoris: Secondary | ICD-10-CM | POA: Diagnosis not present

## 2017-06-11 LAB — BPAM RBC
Blood Product Expiration Date: 201810122359
ISSUE DATE / TIME: 201810021004
Unit Type and Rh: 6200

## 2017-06-11 LAB — TYPE AND SCREEN
ABO/RH(D): A POS
Antibody Screen: NEGATIVE
Unit division: 0

## 2017-06-11 NOTE — Telephone Encounter (Signed)
Dr. Rogue Bussing verbally notified of this. Thanks!

## 2017-06-11 NOTE — Telephone Encounter (Signed)
Patient she had the special blood draw today. Patient states that Dr. Royce Macadamia stated that he will call Dr. Tish Men within the next couple of days, and will inform her of these results in 2 weeks at her next appointment. Thanks!

## 2017-06-11 NOTE — Telephone Encounter (Signed)
Please check with patient if she has had the special lab draw at [facility] yet; and inform me. Thx GB

## 2017-06-12 DIAGNOSIS — D469 Myelodysplastic syndrome, unspecified: Principal | ICD-10-CM

## 2017-06-16 ENCOUNTER — Inpatient Hospital Stay: Payer: Medicare Other

## 2017-06-16 ENCOUNTER — Other Ambulatory Visit: Payer: Self-pay | Admitting: *Deleted

## 2017-06-16 DIAGNOSIS — E1122 Type 2 diabetes mellitus with diabetic chronic kidney disease: Secondary | ICD-10-CM | POA: Diagnosis not present

## 2017-06-16 DIAGNOSIS — D649 Anemia, unspecified: Secondary | ICD-10-CM

## 2017-06-16 DIAGNOSIS — Z79899 Other long term (current) drug therapy: Secondary | ICD-10-CM | POA: Diagnosis not present

## 2017-06-16 DIAGNOSIS — D4622 Refractory anemia with excess of blasts 2: Secondary | ICD-10-CM | POA: Diagnosis not present

## 2017-06-16 DIAGNOSIS — K219 Gastro-esophageal reflux disease without esophagitis: Secondary | ICD-10-CM | POA: Diagnosis not present

## 2017-06-16 DIAGNOSIS — Z87891 Personal history of nicotine dependence: Secondary | ICD-10-CM | POA: Diagnosis not present

## 2017-06-16 DIAGNOSIS — N183 Chronic kidney disease, stage 3 (moderate): Secondary | ICD-10-CM | POA: Diagnosis not present

## 2017-06-16 DIAGNOSIS — Z881 Allergy status to other antibiotic agents status: Secondary | ICD-10-CM | POA: Diagnosis not present

## 2017-06-16 DIAGNOSIS — D46Z Other myelodysplastic syndromes: Secondary | ICD-10-CM

## 2017-06-16 DIAGNOSIS — I129 Hypertensive chronic kidney disease with stage 1 through stage 4 chronic kidney disease, or unspecified chronic kidney disease: Secondary | ICD-10-CM | POA: Diagnosis not present

## 2017-06-16 DIAGNOSIS — E785 Hyperlipidemia, unspecified: Secondary | ICD-10-CM | POA: Diagnosis not present

## 2017-06-16 DIAGNOSIS — C9202 Acute myeloblastic leukemia, in relapse: Secondary | ICD-10-CM | POA: Diagnosis not present

## 2017-06-16 DIAGNOSIS — Z7984 Long term (current) use of oral hypoglycemic drugs: Secondary | ICD-10-CM | POA: Diagnosis not present

## 2017-06-16 LAB — CBC WITH DIFFERENTIAL/PLATELET
BASOS ABS: 0 10*3/uL (ref 0–0.1)
BASOS PCT: 0 %
EOS PCT: 0 %
Eosinophils Absolute: 0 10*3/uL (ref 0–0.7)
HEMATOCRIT: 22.9 % — AB (ref 35.0–47.0)
Hemoglobin: 7.6 g/dL — ABNORMAL LOW (ref 12.0–16.0)
LYMPHS PCT: 50 %
Lymphs Abs: 1.3 10*3/uL (ref 1.0–3.6)
MCH: 32.7 pg (ref 26.0–34.0)
MCHC: 33.1 g/dL (ref 32.0–36.0)
MCV: 98.9 fL (ref 80.0–100.0)
MONO ABS: 0.2 10*3/uL (ref 0.2–0.9)
MONOS PCT: 8 %
NEUTROS PCT: 24 %
Neutro Abs: 0.6 10*3/uL — ABNORMAL LOW (ref 1.4–6.5)
Other: 18 %
Platelets: 94 10*3/uL — ABNORMAL LOW (ref 150–440)
RBC: 2.32 MIL/uL — ABNORMAL LOW (ref 3.80–5.20)
RDW: 19.7 % — AB (ref 11.5–14.5)
WBC: 2.5 10*3/uL — ABNORMAL LOW (ref 3.6–11.0)

## 2017-06-16 LAB — PREPARE RBC (CROSSMATCH)

## 2017-06-16 LAB — SAMPLE TO BLOOD BANK

## 2017-06-16 MED ORDER — DIPHENHYDRAMINE HCL 25 MG PO CAPS
25.0000 mg | ORAL_CAPSULE | Freq: Once | ORAL | Status: AC
  Administered 2017-06-16: 25 mg via ORAL
  Filled 2017-06-16: qty 1

## 2017-06-16 MED ORDER — ACETAMINOPHEN 325 MG PO TABS
650.0000 mg | ORAL_TABLET | Freq: Once | ORAL | Status: AC
  Administered 2017-06-16: 650 mg via ORAL
  Filled 2017-06-16: qty 2

## 2017-06-16 MED ORDER — SODIUM CHLORIDE 0.9 % IV SOLN
250.0000 mL | Freq: Once | INTRAVENOUS | Status: AC
  Administered 2017-06-16: 250 mL via INTRAVENOUS
  Filled 2017-06-16: qty 250

## 2017-06-17 ENCOUNTER — Encounter: Payer: Self-pay | Admitting: Family Medicine

## 2017-06-17 ENCOUNTER — Ambulatory Visit (INDEPENDENT_AMBULATORY_CARE_PROVIDER_SITE_OTHER): Payer: Medicare Other | Admitting: Family Medicine

## 2017-06-17 VITALS — BP 132/64 | HR 79 | Ht 64.0 in | Wt 242.1 lb

## 2017-06-17 DIAGNOSIS — E785 Hyperlipidemia, unspecified: Secondary | ICD-10-CM

## 2017-06-17 DIAGNOSIS — E1121 Type 2 diabetes mellitus with diabetic nephropathy: Secondary | ICD-10-CM

## 2017-06-17 DIAGNOSIS — D46Z Other myelodysplastic syndromes: Secondary | ICD-10-CM

## 2017-06-17 LAB — GLUCOSE, POCT (MANUAL RESULT ENTRY): POC Glucose: 106 mg/dl — AB (ref 70–99)

## 2017-06-17 LAB — BPAM RBC
Blood Product Expiration Date: 201810112359
ISSUE DATE / TIME: 201810081322
UNIT TYPE AND RH: 600

## 2017-06-17 LAB — TYPE AND SCREEN
ABO/RH(D): A POS
Antibody Screen: NEGATIVE
UNIT DIVISION: 0

## 2017-06-17 LAB — POCT GLYCOSYLATED HEMOGLOBIN (HGB A1C): Hemoglobin A1C: 6.3

## 2017-06-17 MED ORDER — METFORMIN HCL 500 MG PO TABS: 500.0000 mg | ORAL_TABLET | Freq: Every day | ORAL | 0 refills | Status: DC

## 2017-06-17 MED ORDER — LISINOPRIL 2.5 MG PO TABS: 2.5000 mg | ORAL_TABLET | Freq: Every day | ORAL | 0 refills | Status: DC

## 2017-06-17 MED ORDER — ROSUVASTATIN CALCIUM 20 MG PO TABS: ORAL_TABLET | ORAL | 0 refills | Status: DC

## 2017-06-17 NOTE — Progress Notes (Signed)
Name: Teresa Carrillo   MRN: [id number]    DOB: 10-Jul-1941   Date:06/17/2017       Progress Note  Subjective  Chief Complaint  Chief Complaint  Patient presents with  . Diabetes  . Hyperlipidemia  . MDS  . Immunizations    Declines flu vaccine     Diabetes  She presents for her follow-up diabetic visit. She has type 2 diabetes mellitus. Her disease course has been stable. There are no hypoglycemic associated symptoms. Pertinent negatives for diabetes include no fatigue, no foot paresthesias, no polydipsia and no polyuria. Symptoms are stable. Pertinent negatives for diabetic complications include no CVA, heart disease or peripheral neuropathy. Risk factors for coronary artery disease include obesity. Current diabetic treatment includes oral agent (monotherapy). Her weight is stable. She is following a diabetic and generally healthy diet. She monitors blood glucose at home 3-4 x per week. Her breakfast blood glucose range is generally 130-140 mg/dl. An ACE inhibitor/angiotensin II receptor blocker is being taken. Eye exam is current.  Hyperlipidemia  This is a chronic problem. The problem is controlled. Recent lipid tests were reviewed and are normal. Pertinent negatives include no leg pain, myalgias or shortness of breath. Current antihyperlipidemic treatment includes statins.      Past Medical History:  Diagnosis Date  . Abdominal wall mass   . Anginal pain ([place])   . Anxiety   . Arthritis   . Calculus of kidney   . Cystitis   . Depression   . Diabetes mellitus without complication (HCC)    elevated A1c  . Dyspnea on exertion   . Elevated serum creatinine   . Fibrocystic breast disease   . GERD (gastroesophageal reflux disease)   . Hearing loss   . Heart murmur   . HTN (hypertension)   . Hyperlipidemia   . MDS (myelodysplastic syndrome), high grade ([place]) 04/23/2016  . Microscopic hematuria   . Mouth sores   . Mucositis due to chemotherapy   . Obesity   . Risk for falls    . Sleep apnea   . Thrombocytopenia ([place])   . Urinary frequency   . Urinary urgency     Past Surgical History:  Procedure Laterality Date  . ABDOMINAL HYSTERECTOMY    . APPENDECTOMY    . CARDIAC CATHETERIZATION     x2  . CHOLECYSTECTOMY    . COLONOSCOPY N/A 02/24/2015   Procedure: COLONOSCOPY;  Surgeon: Manya Silvas, MD;  Location: [facility];  Service: Endoscopy;  Laterality: N/A;  . DIAGNOSTIC LAPAROSCOPY     Removal of benign abdominal tumor  . ESOPHAGOGASTRODUODENOSCOPY N/A 02/24/2015   Procedure: ESOPHAGOGASTRODUODENOSCOPY (EGD);  Surgeon: Manya Silvas, MD;  [facility];  Service: Endoscopy;  Laterality: N/A;  . right eye surgery Right     Family History  Problem Relation Age of Onset  . Congestive Heart Failure Mother   . Diabetes Mother   . Coronary artery disease Mother   . Stroke Mother   . Cirrhosis Father     Social History   Social History  . Marital status: Married    Spouse name: N/A  . Number of children: N/A  . Years of education: N/A   Occupational History  . Not on file.   Social History Main Topics  . Smoking status: Former Smoker    Quit date: 09/09/1988  . Smokeless tobacco: Never Used     Comment: quit 25 years ago  . Alcohol use No  . Drug use: No  .  Sexual activity: Not Currently    Birth control/ protection: Post-menopausal   Other Topics Concern  . Not on file   Social History Narrative  . No narrative on file     Current Outpatient Prescriptions:  .  acyclovir (ZOVIRAX) 400 MG tablet, TAKE 1 TABLET(400 MG) BY MOUTH TWICE DAILY, Disp: 180 tablet, Rfl: 0 .  buPROPion (WELLBUTRIN XL) 150 MG 24 hr tablet, Take 300 mg by mouth daily at 12 noon. , Disp: , Rfl:  .  clonazePAM (KLONOPIN) 0.5 MG tablet, Take 0.5 mg by mouth 2 (two) times daily. , Disp: , Rfl:  .  cyclopentolate (CYCLODRYL,CYCLOGYL) 1 % ophthalmic solution, INT 1 GTT IN OS BID, Disp: , Rfl: 0 .  diltiazem (CARDIZEM CD) 180 MG 24 hr capsule, Take 1  capsule (180 mg total) by mouth daily., Disp: 30 capsule, Rfl: 0 .  hydrocortisone (ANUSOL-HC) 2.5 % rectal cream, Place 1 application rectally 2 (two) times daily as needed for hemorrhoids or itching., Disp: 30 g, Rfl: 0 .  levofloxacin (LEVAQUIN) 500 MG tablet, Take 1 tablet (500 mg total) by mouth daily., Disp: 30 tablet, Rfl: 0 .  lisinopril (PRINIVIL,ZESTRIL) 2.5 MG tablet, Take 1 tablet (2.5 mg total) by mouth daily., Disp: 90 tablet, Rfl: 0 .  LUMIGAN 0.01 % SOLN, Place 1 drop into both eyes at bedtime. , Disp: , Rfl:  .  magic mouthwash w/lidocaine SOLN, Take 5 mLs by mouth 4 (four) times daily. 80 ml viscous lidocaine 2%, 80 ml Mylanta, 80 ml Diphenhydramine 12.5 mg/5 ml Elixir, 80 ml Nystatin 100,000 Unit suspension, 80 ml Prednisolone 15 mg/55ml, 80 ml Distilled Water.  Sig: Swish/Swallow 5-10 ml four times a day as needed. Dispense 480 ml. 3RFs, Disp: 480 mL, Rfl: 3 .  metFORMIN (GLUCOPHAGE) 500 MG tablet, TAKE 1 TABLET BY MOUTH DAILY, Disp: 90 tablet, Rfl: 0 .  Multiple Vitamin (MULTIVITAMIN WITH MINERALS) TABS tablet, Take 1 tablet by mouth daily., Disp: , Rfl:  .  Omega-3 Fatty Acids (FISH OIL) 1000 MG CAPS, Take 1 capsule by mouth daily., Disp: , Rfl:  .  ondansetron (ZOFRAN-ODT) 4 MG disintegrating tablet, Take 4 mg by mouth every 8 (eight) hours as needed for nausea or vomiting., Disp: , Rfl:  .  polyethylene glycol (MIRALAX / GLYCOLAX) packet, Take 17 g by mouth daily as needed for mild constipation., Disp: 14 each, Rfl: 1 .  prochlorperazine (COMPAZINE) 10 MG tablet, Take 1 tablet (10 mg total) by mouth every 6 (six) hours as needed (Nausea or vomiting)., Disp: 30 tablet, Rfl: 1 .  rosuvastatin (CRESTOR) 20 MG tablet, TAKE 1 TABLET(20 MG) BY MOUTH AT BEDTIME, Disp: 90 tablet, Rfl: 0 .  sertraline (ZOLOFT) 100 MG tablet, Take 100 mg by mouth daily at 12 noon. , Disp: , Rfl:  .  Umeclidinium-Vilanterol (ANORO ELLIPTA) 62.5-25 MCG/INH AEPB, Inhale 1 puff into the lungs daily as needed  (shortness of breath). , Disp: , Rfl:   Allergies  Allergen Reactions  . Macrobid [employer] Macro] Other (See Comments)    Reaction: unknown     Review of Systems  Constitutional: Negative for fatigue.  Respiratory: Negative for shortness of breath.   Musculoskeletal: Negative for myalgias.  Endo/Heme/Allergies: Negative for polydipsia.      Objective  Vitals:   06/17/17 1045  BP: 132/64  Pulse: 79  SpO2: 98%  Weight: 242 lb 1.6 oz (109.8 kg)  Height: 5\' 4"  (1.626 m)    Physical Exam  Constitutional: She is oriented to  person, place, and time and well-developed, well-nourished, and in no distress.  HENT:  Head: Normocephalic and atraumatic.  Cardiovascular: Normal rate, regular rhythm and normal heart sounds.   No murmur heard. Pulmonary/Chest: Effort normal and breath sounds normal. No respiratory distress.  Neurological: She is alert and oriented to person, place, and time.  Psychiatric: Mood, memory, affect and judgment normal.  Nursing note and vitals reviewed.        Assessment & Plan  1. Type 2 diabetes mellitus with nephropathy ([place]) August A1c 6.3%, well-controlled diabetes, restart lisinopril for renal protection and obtain urine microalbumin. Reassess in 3 months - POCT glycosylated hemoglobin (Hb A1C) - POCT glucose (manual entry) - metFORMIN (GLUCOPHAGE) 500 MG tablet; Take 1 tablet (500 mg total) by mouth daily.  Dispense: 90 tablet; Refill: 0 - lisinopril (PRINIVIL,ZESTRIL) 2.5 MG tablet; Take 1 tablet (2.5 mg total) by mouth daily.  Dispense: 90 tablet; Refill: 0 - Urine Microalbumin w/creat. ratio  2. Dyslipidemia FLP at goal from May 2018, continue on statin, recheck in 3 month - rosuvastatin (CRESTOR) 20 MG tablet; TAKE 1 TABLET(20 MG) BY MOUTH AT BEDTIME  Dispense: 90 tablet; Refill: 0  3. MDS (myelodysplastic syndrome), high grade [facility])  Being followed by oncology at [facility], reviewed office notes.  Eithel Ryall Asad A.  [facility] 06/17/2017 10:59 AM

## 2017-06-18 LAB — MICROALBUMIN / CREATININE URINE RATIO
CREATININE, URINE: 73 mg/dL (ref 20–275)
MICROALB UR: 34.6 mg/dL
MICROALB/CREAT RATIO: 474 ug/mg{creat} — AB (ref <30)

## 2017-06-20 ENCOUNTER — Telehealth: Payer: Self-pay

## 2017-06-20 DIAGNOSIS — R809 Proteinuria, unspecified: Secondary | ICD-10-CM

## 2017-06-20 NOTE — Telephone Encounter (Signed)
Called pt no answer. LM for pt informing her of the information below. Referral placed.

## 2017-06-20 NOTE — Telephone Encounter (Signed)
-----   Message from Roselee Nova, MD sent at 06/18/2017  2:27 PM EDT ----- Urine microalbumin/creatinine ratio is elevated at 474. Patient has diabetes which is well controlled, she was recently restarted on ACE inhibitor, previously she was not able to tolerate it because of hypotension. She also has high-grade myelodysplastic syndrome and should follow up with nephrology for further management

## 2017-06-23 ENCOUNTER — Inpatient Hospital Stay: Payer: Medicare Other

## 2017-06-23 DIAGNOSIS — Z7984 Long term (current) use of oral hypoglycemic drugs: Secondary | ICD-10-CM | POA: Diagnosis not present

## 2017-06-23 DIAGNOSIS — Z79899 Other long term (current) drug therapy: Secondary | ICD-10-CM | POA: Diagnosis not present

## 2017-06-23 DIAGNOSIS — Z881 Allergy status to other antibiotic agents status: Secondary | ICD-10-CM | POA: Diagnosis not present

## 2017-06-23 DIAGNOSIS — N183 Chronic kidney disease, stage 3 (moderate): Secondary | ICD-10-CM | POA: Diagnosis not present

## 2017-06-23 DIAGNOSIS — K219 Gastro-esophageal reflux disease without esophagitis: Secondary | ICD-10-CM | POA: Diagnosis not present

## 2017-06-23 DIAGNOSIS — E1122 Type 2 diabetes mellitus with diabetic chronic kidney disease: Secondary | ICD-10-CM | POA: Diagnosis not present

## 2017-06-23 DIAGNOSIS — E785 Hyperlipidemia, unspecified: Secondary | ICD-10-CM | POA: Diagnosis not present

## 2017-06-23 DIAGNOSIS — Z87891 Personal history of nicotine dependence: Secondary | ICD-10-CM | POA: Diagnosis not present

## 2017-06-23 DIAGNOSIS — C9202 Acute myeloblastic leukemia, in relapse: Secondary | ICD-10-CM | POA: Diagnosis not present

## 2017-06-23 DIAGNOSIS — D4622 Refractory anemia with excess of blasts 2: Secondary | ICD-10-CM | POA: Diagnosis not present

## 2017-06-23 DIAGNOSIS — D46Z Other myelodysplastic syndromes: Secondary | ICD-10-CM

## 2017-06-23 DIAGNOSIS — I129 Hypertensive chronic kidney disease with stage 1 through stage 4 chronic kidney disease, or unspecified chronic kidney disease: Secondary | ICD-10-CM | POA: Diagnosis not present

## 2017-06-23 LAB — BASIC METABOLIC PANEL
ANION GAP: 11 (ref 5–15)
BUN: 17 mg/dL (ref 6–20)
CHLORIDE: 104 mmol/L (ref 101–111)
CO2: 25 mmol/L (ref 22–32)
CREATININE: 1.13 mg/dL — AB (ref 0.44–1.00)
Calcium: 9.1 mg/dL (ref 8.9–10.3)
GFR calc non Af Amer: 46 mL/min — ABNORMAL LOW (ref >60)
GFR, EST AFRICAN AMERICAN: 54 mL/min — AB (ref >60)
Glucose, Bld: 145 mg/dL — ABNORMAL HIGH (ref 65–99)
Potassium: 3.6 mmol/L (ref 3.5–5.1)
SODIUM: 140 mmol/L (ref 135–145)

## 2017-06-23 LAB — CBC WITH DIFFERENTIAL/PLATELET
BAND NEUTROPHILS: 1 %
BASOS PCT: 1 %
Basophils Absolute: 0 10*3/uL (ref 0–0.1)
Blasts: 0 %
EOS ABS: 0 10*3/uL (ref 0–0.7)
EOS PCT: 1 %
HCT: 24.7 % — ABNORMAL LOW (ref 35.0–47.0)
Hemoglobin: 8.4 g/dL — ABNORMAL LOW (ref 12.0–16.0)
LYMPHS PCT: 30 %
Lymphs Abs: 1.3 10*3/uL (ref 1.0–3.6)
MCH: 32.3 pg (ref 26.0–34.0)
MCHC: 33.9 g/dL (ref 32.0–36.0)
MCV: 95.3 fL (ref 80.0–100.0)
MONO ABS: 0.6 10*3/uL (ref 0.2–0.9)
MONOS PCT: 15 %
Metamyelocytes Relative: 1 %
Myelocytes: 3 %
NEUTROS ABS: 1.1 10*3/uL — AB (ref 1.4–6.5)
NEUTROS PCT: 21 %
NRBC: 3 /100{WBCs} — AB
OTHER: 27 %
PLATELETS: 61 10*3/uL — AB (ref 150–440)
Promyelocytes Absolute: 0 %
RBC: 2.59 MIL/uL — ABNORMAL LOW (ref 3.80–5.20)
RDW: 18.4 % — ABNORMAL HIGH (ref 11.5–14.5)
Smear Review: DECREASED
WBC: 4.2 10*3/uL (ref 3.6–11.0)

## 2017-06-23 LAB — SAMPLE TO BLOOD BANK

## 2017-06-23 LAB — PATHOLOGIST SMEAR REVIEW

## 2017-06-25 ENCOUNTER — Encounter (HOSPITAL_COMMUNITY): Payer: Self-pay

## 2017-06-25 ENCOUNTER — Ambulatory Visit
Admission: RE | Admit: 2017-06-25 | Discharge: 2017-06-25 | Disposition: A | Discharge status: Home / Self Care | Payer: MEDICARE

## 2017-06-25 ENCOUNTER — Ambulatory Visit
Admission: RE | Admit: 2017-06-25 | Discharge: 2017-06-25 | Disposition: A | Discharge status: Home / Self Care | Payer: MEDICARE | Attending: Internal Medicine | Admitting: Internal Medicine

## 2017-06-25 DIAGNOSIS — Z23 Encounter for immunization: Secondary | ICD-10-CM

## 2017-06-25 DIAGNOSIS — D46Z Other myelodysplastic syndromes: Principal | ICD-10-CM

## 2017-06-25 DIAGNOSIS — C9202 Acute myeloblastic leukemia, in relapse: Principal | ICD-10-CM

## 2017-06-25 LAB — CHROMOSOME ANALYSIS, BONE MARROW

## 2017-06-25 LAB — CBC W/ AUTO DIFF
HEMATOCRIT: 24.8 % — ABNORMAL LOW (ref 36.0–46.0)
HEMOGLOBIN: 8 g/dL — ABNORMAL LOW (ref 12.0–16.0)
MEAN CORPUSCULAR HEMOGLOBIN CONC: 32.3 g/dL (ref 31.0–37.0)
MEAN CORPUSCULAR HEMOGLOBIN: 31.1 pg (ref 26.0–34.0)
MEAN CORPUSCULAR VOLUME: 96.5 fL (ref 80.0–100.0)
MEAN PLATELET VOLUME: 10.7 fL — ABNORMAL HIGH (ref 7.0–10.0)
PLATELET COUNT: 58 10*9/L — ABNORMAL LOW (ref 150–440)
RED CELL DISTRIBUTION WIDTH: 18.8 % — ABNORMAL HIGH (ref 12.0–15.0)
WBC ADJUSTED: 4.1 10*9/L — ABNORMAL LOW (ref 4.5–11.0)

## 2017-06-25 LAB — COMPREHENSIVE METABOLIC PANEL
ALBUMIN: 3.5 g/dL (ref 3.5–5.0)
ALT (SGPT): 25 U/L (ref 15–48)
ANION GAP: 15 mmol/L (ref 9–15)
AST (SGOT): 13 U/L — ABNORMAL LOW (ref 14–38)
BILIRUBIN TOTAL: 0.5 mg/dL (ref 0.0–1.2)
BLOOD UREA NITROGEN: 18 mg/dL (ref 7–21)
BUN / CREAT RATIO: 16
CALCIUM: 8.8 mg/dL (ref 8.5–10.2)
CHLORIDE: 105 mmol/L (ref 98–107)
CO2: 25 mmol/L (ref 22.0–30.0)
EGFR MDRD AF AMER: 58 mL/min/{1.73_m2} — ABNORMAL LOW (ref >=60)
EGFR MDRD NON AF AMER: 48 mL/min/{1.73_m2} — ABNORMAL LOW (ref >=60)
GLUCOSE RANDOM: 127 mg/dL (ref 65–179)
POTASSIUM: 3.4 mmol/L — ABNORMAL LOW (ref 3.5–5.0)
PROTEIN TOTAL: 6.8 g/dL (ref 6.5–8.3)

## 2017-06-25 LAB — PLATELET COUNT: Lab: 58 — ABNORMAL LOW

## 2017-06-25 LAB — MANUAL DIFFERENTIAL
BASOPHILS - ABS (DIFF): 0 10*9/L (ref 0.0–0.1)
BLASTS - REL (DIFF): 30 % (ref <=0)
LYMPHOCYTES - ABS (DIFF): 1.4 10*9/L — ABNORMAL LOW (ref 1.5–5.0)
MONOCYTES - ABS (DIFF): 0.5 10*9/L (ref 0.2–0.8)
NEUTROPHILS - ABS (DIFF): 0.9 10*9/L — ABNORMAL LOW (ref 2.0–7.5)

## 2017-06-25 LAB — EOSINOPHILS - ABS (DIFF): Lab: 0

## 2017-06-25 LAB — EGFR MDRD AF AMER
Glomerular filtration rate/1.73 sq M.predicted.black:ArVRat:Pt:Ser/Plas/Bld:Qn:Creatinine-based formula (MDRD): 58 — ABNORMAL LOW

## 2017-06-25 MED ORDER — ENASIDENIB 100 MG TABLET: 100 mg | tablet | Freq: Every day | 6 refills | 0 days | Status: AC

## 2017-06-25 MED ORDER — ENASIDENIB 100 MG TABLET
ORAL_TABLET | Freq: Every day | ORAL | 6 refills | 0.00000 days | Status: CP
Start: 2017-06-25 — End: 2017-12-15

## 2017-06-25 MED FILL — IDHIFA/100 MG/TAB: IDHIFA/100 MG/TAB | 30 days supply | Qty: 30 | Fill #0

## 2017-06-25 NOTE — Unmapped (Cosign Needed)
ADVANCE CARE PLANNING NOTE    Discussion Date:  June 25, 2017    Patient has decisional capacity:  Yes    Patient has selected a Health Care Decision-Maker if loses capacity: Yes  Name:  Husband   Contact Information:        Basis of health care decision-maker's authority?: stated patient preference    Discussion Participants:  Dr. Arnold Long, patient, husband, sister    Communication of Medical Status/Prognosis:   We discussed that she has a terminal leukemia and treatment is of palliative intent.     Communication of Treatment Goals/Options:   We also discussed with her desires about end of life. Given she has a terminal diagnosis that we are treating with palliative intent and she wants to be able to enjoy time with her husband and sister while she can enjoy it and laugh, she would not want to be resuscitated if she were to die. She'd be willing to do a brief ~2 day ventilator trial if she were to have a reversible cause of respiratory failure but would never want to be resusitated if she were to have a cardiac arrest.    Treatment Decisions:   DNR  Can attempt intubation for acute respiratory failure but husband and sister know that she would not want to be kept alive on artificial life support.    I spent between 16-45 minutes providing voluntary advance care planning services for this patient.

## 2017-06-25 NOTE — Unmapped (Addendum)
As we discussed, we found three mutations in your leukemia, two of which have oral medications available to treat them. The mutations are, for your information:  -IDH2  -FLT3  -DNTM3A    The IDH2 medication has an oral chemotherapy that we can use, called enasidenib. The information on that medication is below. We are going to work on finding out how much the medication will cost and what assistance we can get for paying for it.    Number to call is below.   For pharmacy questions, ask for [facility] in oncology pharmacy.  For other questions for [place], you can ask for Asher Muir, the nurse navigator.    enasidenib  Pronunciation:  EN a SID a nib  Brand:  Idhifa  What is the most important information I should know about enasidenib?  Enasidenib can cause a condition called differentiation syndrome, which affects blood cells and can be fatal if not treated. This condition may occur within 10 days to 5 months after you start taking enasidenib.  Seek medical help right away if you have symptoms of differentiation syndrome: fever, cough, trouble breathing, bone pain, rapid weight gain, or swelling in your arms, legs, underarms, groin, or neck.  What is enasidenib?  Enasidenib works by blocking the function of a certain protein that has become mutated in white blood cells. This mutation keeps the blood cells from growing and functioning normally, and the build-up of these abnormal cells can lead to acute myeloid leukemia.  Enasidenib is used to treat acute myeloid leukemia in adults who have a mutation in the protein that this medicine targets and blocks.  Enasidenib is usually given after other medicines have been tried without success.  Enasidenib may also be used for purposes not listed in this medication guide.  What should I discuss with my healthcare provider before taking enasidenib?  You should not use enasidenib if you are allergic to it.  Before using enasidenib tell your doctor about all your medical conditions or allergies. You may need to have a negative pregnancy test before starting this treatment. In animal studies, enasidenib caused miscarriage, low birth weight, stillbirth, and birth defects.  Enasidenib may harm an unborn baby.  Use a barrier form of birth control (condom or diaphragm with spermicide) to prevent pregnancy while you are using enasidenib. Hormonal contraception (birth control pills, injections, implants, skin patches, and vaginal rings) may not be effective enough to prevent pregnancy during your treatment.  You should use birth control to prevent pregnancy while using this medicine whether you are a man or a woman. Enasidenib use by either parent may cause birth defects.  Keep using birth control for at least 1 month after your last dose of enasidenib. Tell your doctor right away if a pregnancy occurs while either the mother or the father is using enasidenib.  This medicine may affect fertility (ability to have children) in both men and women. However, it is important to use birth control to prevent pregnancy because enasidenib may harm the baby if a pregnancy does occur.  It is not known whether enasidenib passes into breast milk or if it could harm a nursing baby. You should not breast-feed while using this medicine, and for at least 1 month after your last dose.  How should I take enasidenib?  Follow all directions on your prescription label. Do not use this medicine in larger or smaller amounts or for longer than recommended.  Enasidenib is usually given once per day. Take this medicine with  a full glass of water, at the same time each day. Drink plenty of liquids while you are taking enasidenib.  You may take enasidenib with or without food.  Do not crush, chew, or break an enasidenib tablet. Swallow it whole.  Enasidenib is usually given until your body no longer responds to the medication.  If you vomit shortly after taking enasidenib, take another dose as soon as possible. Then take your next dose at the regularly scheduled time.  You may need frequent medical tests to be sure this medicine is not causing harmful effects. Your cancer treatments may be delayed based on the results of these tests.  Store at room temperature away from moisture and heat. Keep the tablets in their original container, along with the canister of moisture-absorbing preservative.  What happens if I miss a dose?  Take the missed dose as soon as you remember. Skip the missed dose if it is almost time for your next scheduled dose. Do not take extra medicine to make up the missed dose.  What happens if I overdose?  Seek emergency medical attention or call the Poison Help line at [phone].  What should I avoid while taking enasidenib?  Follow your doctor's instructions about any restrictions on food, beverages, or activity.  What are the possible side effects of enasidenib?  Get emergency medical help if you have signs of an allergic reaction: hives; difficult breathing; swelling of your face, lips, tongue, or throat.  Enasidenib can cause a condition called differentiation syndrome, which affects blood cells and can be fatal if not treated. This condition may occur within 10 days to 5 months after you start taking enasidenib.  Seek medical help right away if you have symptoms of differentiation syndrome:  ?? fever, cough, trouble breathing;  ?? bone pain;  ?? rapid weight gain; or  ?? swelling in your arms, legs, underarms, groin, or neck.  Call your doctor at once if you have any of these side effects:  ?? dark urine, clay-colored stools, jaundice (yellowing of the skin or eyes);  ?? severe or ongoing vomiting or diarrhea; or  ?? signs of tumor cell breakdown --vomiting, diarrhea; little or no urination; numbness or tingly feeling; muscle weakness or twitching; fast or slow heart rate; confusion, hallucinations, seizure, feeling restless or irritable.  Common side effects may include:  ?? nausea, vomiting, diarrhea;  ?? loss of appetite; or  ?? jaundice.  This is not a complete list of side effects and others may occur. Call your doctor for medical advice about side effects. You may report side effects to FDA at 1-800-FDA-1088.  What other drugs will affect enasidenib?  Other drugs may interact with enasidenib, including prescription and over-the-counter medicines, vitamins, and herbal products. Tell your doctor about all your current medicines and any medicine you start or stop using.  Where can I get more information?  Your pharmacist can provide more information about enasidenib.    Remember, keep this and all other medicines out of the reach of children, never share your medicines with others, and use this medication only for the indication prescribed.  Every effort has been made to ensure that the information provided by [employer] ('[employer]') is accurate, up-to-date, and complete, but no guarantee is made to that effect. Drug information contained herein may be time sensitive. [employer] information has been compiled for use by healthcare practitioners and consumers in the [place] and therefore [employer] does not warrant that uses outside of the [place]  are appropriate, unless specifically indicated otherwise. [employer]'s drug information does not endorse drugs, diagnose patients or recommend therapy. [employer]'s drug information is an [employer], corporate to assist licensed healthcare practitioners in caring for their patients and/or to serve consumers viewing this service as a supplement to, and not a substitute for, the expertise, skill, knowledge and judgment of healthcare practitioners. The absence of a warning for a given drug or drug combination in no way should be construed to indicate that the drug or drug combination is safe, effective or appropriate for any given patient. [employer] does not assume any responsibility for any aspect of healthcare administered with the aid of information [employer] provides. The information contained herein is not intended to cover all possible uses, directions, precautions, warnings, drug interactions, allergic reactions, or adverse effects. If you have questions about the drugs you are taking, check with your doctor, nurse or pharmacist.  Copyright [phone] [employer] Version: 1.01. Revision date: 04/15/2016.  Care instructions adapted under license by [facility]. If you have questions about a medical condition or this instruction, always ask your healthcare professional. [employer] disclaims any warranty or liability for your use of this information.        Labwork:  Lab on 06/25/2017   Component Date Value Ref Range Status   ??? Sodium 06/25/2017 145  135 - 145 mmol/L Final   ??? Potassium 06/25/2017 3.4* 3.5 - 5.0 mmol/L Final   ??? Chloride 06/25/2017 105  98 - 107 mmol/L Final   ??? CO2 06/25/2017 25.0  22.0 - 30.0 mmol/L Final   ??? BUN 06/25/2017 18  7 - 21 mg/dL Final   ??? Creatinine 06/25/2017 1.11* 0.60 - 1.00 mg/dL Final   ??? BUN/Creatinine Ratio 06/25/2017 16   Final   ??? EGFR MDRD Non Af Amer 06/25/2017 48* >=60 mL/min/1.36m2 Final   ??? EGFR MDRD Af Amer 06/25/2017 58* >=60 mL/min/1.28m2 Final   ??? Anion Gap 06/25/2017 15  9 - 15 mmol/L Final   ??? Glucose 06/25/2017 127  65 - 179 mg/dL Final   ??? Calcium 86/57/8469 8.8  8.5 - 10.2 mg/dL Final   ??? Albumin 62/95/2841 3.5  3.5 - 5.0 g/dL Final   ??? Total Protein 06/25/2017 6.8  6.5 - 8.3 g/dL Final   ??? Total Bilirubin 06/25/2017 0.5  0.0 - 1.2 mg/dL Final   ??? AST 32/44/0102 13* 14 - 38 U/L Final   ??? ALT 06/25/2017 25  15 - 48 U/L Final   ??? Alkaline Phosphatase 06/25/2017 92  38 - 126 U/L Final   ??? WBC 06/25/2017 4.1* 4.5 - 11.0 10*9/L Preliminary   ??? RBC 06/25/2017 2.57* 4.00 - 5.20 10*12/L Preliminary   ??? HGB 06/25/2017 8.0* 12.0 - 16.0 g/dL Preliminary   ??? HCT 06/25/2017 24.8* 36.0 - 46.0 % Preliminary   ??? MCV 06/25/2017 96.5  80.0 - 100.0 fL Preliminary   ??? MCH 06/25/2017 31.1  26.0 - 34.0 pg Preliminary   ??? MCHC 06/25/2017 32.3 31.0 - 37.0 g/dL Preliminary   ??? RDW 06/25/2017 18.8* 12.0 - 15.0 % Preliminary   ??? MPV 06/25/2017 10.7* 7.0 - 10.0 fL Preliminary   ??? Platelet 06/25/2017 58* 150 - 440 10*9/L Preliminary   ??? Variable HGB Concentration 06/25/2017 Slight* Not Present Preliminary   ??? Macrocytosis 06/25/2017 Moderate* Not Present Preliminary   ??? Anisocytosis 06/25/2017 Moderate* Not Present Preliminary   ??? Hypochromasia 06/25/2017 Moderate* Not Present Preliminary       Please call us if  you experience:   1. Nausea or vomiting not controlled by nausea medicines  2. Fever of 100.4 F or higher   3. Uncontrolled pain  4. Any other concerning symptom     For any cancer-related concerns, including:   Appointment information   Questions regarding your cancer diagnosis or treatment   Any new symptoms.     Please call [phone].    On Nights, Weekends and Holidays please and ask for the oncologist on call.    [facility]  [address]  [place], [place] [address]  [email or web address]

## 2017-06-25 NOTE — Unmapped (Signed)
[facility]ukemia Clinic Follow-up    Patient Name: Jessica Wilson  Patient Age: 76 y.o.  Encounter Date: 06/25/2017    Primary Care Provider:  Tyson Dense, MD    Referring Physician:  Nigel Bridgeman, MD  [address]  [address]  [place], [place] [address]    Reason for visit:  Follow up of MDS    History of Present Illness:  We had the pleasure of seeing Jessica Wilson in the Leukemia Clinic at the [place] [facility] on 06/25/2017.  She is a 76 y.o. 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      She was on azacitidine until 04/2017 (received about 12 cycles total) now held given progression to AML.    Her oncologic history is as follows:      Oncology History    Referring/Local Oncologist: Dr. Louretta Shorten, [facility]    Diagnosis: MDS with Excess Blasts-2    Genetics:   Karyotype/FISH: 46XX     Molecular Genetics: not performed    Pertinent Phenotypic data: no aberrant immunophenotype by flow cytometry, however blasts stained by IHC for CD117, MPO.  Only 5% marrow cells stained for CD34.    Disease-specific prognostic estimation: IPSS-R high risk, median OS 1.6 years, with 25% AML risk at 1.4 years            MDS (myelodysplastic syndrome), high grade (CMS-HCC)    04/17/2016 Initial Diagnosis     MDS (myelodysplastic syndrome), high grade (RAF-HCC)       04/29/2016 -  Chemotherapy     Azacitidine cycle 1: 75 mg/m2 [place] days 1-7 of 28-day cycles         05/27/2016 -  Chemotherapy     Azacitidine cycle 2: 75 mg/m2 [place] days 1-7 of 28 day cycles         06/24/2016 -  Chemotherapy     Azacitidine cycle 3: 75 mg/m2 [place] days 1-7 of 28 day cycle         07/22/2016 -  Chemotherapy     Azacitidine cycle 4: 75 mg/m2 [place] days 1-7 of 28 day cycle         08/19/2016 -  Chemotherapy     Azacitidine cycle 5: 75 mg/m2 [place] x 7 days of 28 day cycle         09/16/2016 -  Chemotherapy     Azacitidine cycle 6: 75 mbg/m2 [place] x 7 days of 28 day cycle         10/14/2016 -  Chemotherapy     Azacitidine cycle 7: 75 mg/m2 [place] x 7 days of 28 day cycle         12/09/2016 -  Chemotherapy     Azacitidine cycle 8: 60 mg/m2 [place] x 7 days of 28 day (cycle reduced by 20% due to hematologic toxicity)         01/13/2017 -  Chemotherapy     Azacitidine cycle 9: 60 mg/m2  x 5 days of 28 day cycle (reduced by 2 days and 20% per dose due to hematologic toxicity)         05/29/2017 Progression     Given increasing transfusion requirements, repeat bone marrow biopsy done, now with 35% blasts and meets criteria for progression to AML.            Interim History:  Since last seen here, she has had a blood transfusion, last 10/8. She is feeling even  weaker, especially after any exertion including folding clothes. Even after a shower, she does have significant shortness of breath, gasping for air at times. She denies fever, cough, chest pain, chest pressure.    We also discussed with her desires about end of life. Given she has a terminal diagnosis that we are treating with palliative intent and she wants to be able to enjoy time with her husband and sister while she can enjoy it and laugh, she would not want to be resuscitated if she were to die. She'd be willing to do a brief ~2 day ventilator trial if she were to have a reversible cause of respiratory failure but would never want to be resusitated if she were to have a cardiac arrest.    Otherwise, she denies new constitutional symptoms such as anorexia, weight loss night sweats or unexplained fevers.  Furthermore, she denies symptoms of marrow failure: unexplained bleeding or bruising, recurrent or unexplained intercurrent infections, dyspnea on exertion, lightheadedness, palpitations or chest pain.  There have been no new or unexplained pains or self-identified masses, swelling or enlarged lymph nodes.    Past Medical, Surgical and Family History were reviewed and pertinent updates were made in the Electronic Medical Record    Review of Systems:  Other than as reported above in the interim history, all other systems were negative.    ECOG Performance Status: 2    Medications:    Current Outpatient Prescriptions   Medication Sig Dispense Refill   ??? acyclovir (ZOVIRAX) 400 MG tablet Take 400 mg by mouth Two (2) times a day.      ??? bisoprolol (ZEBETA) 5 MG tablet Take 5 mg by mouth daily.     ??? buPROPion (WELLBUTRIN) 100 MG tablet Take 300 mg by mouth daily at 0600.      ??? clonazePAM (KLONOPIN) 0.5 MG tablet Take 1 mg by mouth two (2) times a day as needed for anxiety.      ??? metFORMIN (GLUCOPHAGE) 500 MG tablet Take 500 mg by mouth nightly.      ??? omega-3 fatty acids-fish oil 340-1,000 mg capsule Take 1 capsule by mouth.     ??? rosuvastatin (CRESTOR) 20 MG tablet Take 20 mg by mouth daily.      ??? sertraline (ZOLOFT) 100 MG tablet Take 150 mg by mouth daily.      ??? umeclidinium-vilanterol (ANORO ELLIPTA) 62.5-25 mcg/actuation inhaler Inhale 1 puff daily.       No current facility-administered medications for this visit.        Vital Signs:  Vitals:    06/25/17 0756   BP: 170/72   Pulse: 70   Resp: 18   Temp: 37 ??C (98.6 ??F)   SpO2: 95%       Physical Exam:  Constitutional: NAD, breathing comfortably  Neuro: alert, grossly oriented  HEENT: mmm, no oral lesions; L eye teary with post-surgical iris change; R eye closed, dry  CV: regular rate, no murmurs appreciated  Pulm: clear, no w/r/r  Lymph: no cervical, supraclavicular, axillary, or inguinal LAD  GI: obese, soft, non-distended, non-tender, no HSM  MSK: wide-based gait  Integ: no rash noted; bone marrow biopsy site clean/dry and without bruising though with subcutaneous fluid, +tender  Psych: affect appropriate        Relevant Laboratory, radiology and pathology results:  I personally viewed the most recent external records, laboratory results or peripheral blood smear and discussed the available results with the patient or family.  A summary of  results follows:  Lab on 06/25/2017   Component Date Value Ref Range Status   ??? Sodium 06/25/2017 145  135 - 145 mmol/L Final   ??? Potassium 06/25/2017 3.4* 3.5 - 5.0 mmol/L Final   ??? Chloride 06/25/2017 105  98 - 107 mmol/L Final   ??? CO2 06/25/2017 25.0  22.0 - 30.0 mmol/L Final   ??? BUN 06/25/2017 18  7 - 21 mg/dL Final   ??? Creatinine 06/25/2017 1.11* 0.60 - 1.00 mg/dL Final   ??? BUN/Creatinine Ratio 06/25/2017 16   Final   ??? EGFR MDRD Non Af Amer 06/25/2017 48* >=60 mL/min/1.58m2 Final   ??? EGFR MDRD Af Amer 06/25/2017 58* >=60 mL/min/1.63m2 Final   ??? Anion Gap 06/25/2017 15  9 - 15 mmol/L Final   ??? Glucose 06/25/2017 127  65 - 179 mg/dL Final   ??? Calcium 16/06/9603 8.8  8.5 - 10.2 mg/dL Final   ??? Albumin 54/05/8118 3.5  3.5 - 5.0 g/dL Final   ??? Total Protein 06/25/2017 6.8  6.5 - 8.3 g/dL Final   ??? Total Bilirubin 06/25/2017 0.5  0.0 - 1.2 mg/dL Final   ??? AST 14/78/2956 13* 14 - 38 U/L Final   ??? ALT 06/25/2017 25  15 - 48 U/L Final   ??? Alkaline Phosphatase 06/25/2017 92  38 - 126 U/L Final   ??? WBC 06/25/2017 4.1* 4.5 - 11.0 10*9/L Preliminary   ??? RBC 06/25/2017 2.57* 4.00 - 5.20 10*12/L Preliminary   ??? HGB 06/25/2017 8.0* 12.0 - 16.0 g/dL Preliminary   ??? HCT 06/25/2017 24.8* 36.0 - 46.0 % Preliminary   ??? MCV 06/25/2017 96.5  80.0 - 100.0 fL Preliminary   ??? MCH 06/25/2017 31.1  26.0 - 34.0 pg Preliminary   ??? MCHC 06/25/2017 32.3  31.0 - 37.0 g/dL Preliminary   ??? RDW 06/25/2017 18.8* 12.0 - 15.0 % Preliminary   ??? MPV 06/25/2017 10.7* 7.0 - 10.0 fL Preliminary   ??? Platelet 06/25/2017 58* 150 - 440 10*9/L Preliminary   ??? Variable HGB Concentration 06/25/2017 Slight* Not Present Preliminary   ??? Macrocytosis 06/25/2017 Moderate* Not Present Preliminary   ??? Anisocytosis 06/25/2017 Moderate* Not Present Preliminary   ??? Hypochromasia 06/25/2017 Moderate* Not Present Preliminary       ECHO 04/2017  INTERPRETATION  NORMAL LEFT VENTRICULAR SYSTOLIC FUNCTION  NORMAL RIGHT VENTRICULAR SYSTOLIC FUNCTION  MILD VALVULAR REGURGITATION (See above)  NO VALVULAR STENOSIS  EF 55-60%    Bone marrow biopsy, full results not found in care everywhere  ACUTE MYELOID LEUKEMIA [38% blasts]; and also the peripheral blood with blasts.        Assessment:  Patient Active Problem List    Diagnosis Date Noted   ??? Paroxysmal atrial fibrillation (CMS-HCC) 06/11/2017   ??? Panic attack 06/11/2017   ??? Acute myeloid leukemia in relapse (CMS-HCC) 06/02/2017   ??? Thrombocytopenia (CMS-HCC) 10/21/2016   ??? MDS (myelodysplastic syndrome), high grade (CMS-HCC) 04/17/2016   ??? Anemia 02/21/2016   ??? Proteinuria 05/10/2015   ??? Dyslipidemia 05/05/2015   ??? Essential (primary) hypertension 05/05/2015   ??? Arteriosclerosis of coronary artery 02/02/2015   ??? Diabetes mellitus, type 2 (CMS-HCC) 02/02/2015   ??? COPD, mild (CMS-HCC) 05/02/2014       A 76 y.o. year old female with AML progressed from previous Myelodysplastic Syndrome now s/p 12 cycles of azacitidine prior to progression to AML. She had hematologic improvement, most notably early in her course of azacitidine, but has progressed clearly over the last 3-4 months, requiring more  transfusions and with 38% blasts in the bone marrow.  We discussed the gravity of her prognosis. Her functional status is ECOG 2 at best now. Last visit, we discussed clinical trials given the fact that there are no approved standards of care for patients with AML and previous MDS who failed DNA methyltransferase inhibitors as well as CPX and venetoclax. We sent NGS from peripheral blood today for possible actionable mutations (such as FLT-3). We found  At the end of the visit I answered her and her family's questions to the best of our ability.    Plan and Recommendations:  AML, secondary after MDS  -progressed to AML on 5-AZA 05/2017 after one year of therapy  -NGS sent 06/2017   -FLT3-ITD <0.05 mutational burden   -IDH2   -DNMT3A  --treatment options include FLT3 inhibition and IDH2 inhibition; given low level FLT3-ITD allelic ratio (admittedly in peripheral blood, not bone marrow) and also considering side effect profile, we recommended enasidenib --we will work on pharmacy approval and find out the copay now  --continue your current transfusion plan with Dr. Donneta Romberg    We have discussed the results of the diagnostic tests which confirm her to have AML. We went over systemic treatment regimen. Potential benefits of the proposed treatment are prolongation of survival, improvement of symptoms and maintaining QoL. The intent of the treatment is palliative. I explained in detail the short-term and long-term risks/side effects that are commonly experienced by patients receiving the proposed treatment including but not limited to differentiation syndrome, elevated bilirubin (Gilbert-like). I also explained less common but are very severe side effects including a rare risk of death from treatment. We discussed the reasonable alternatives to the proposed treatment, as above, and risks associated with the alternatives. I mentioned the patient can decide to stop treatment at any time. All of the patient's and family's questions were answered to their satisfaction. Jessica Wilson expressed understanding and consented to proceed the proposed treatment    Iron balance: She was not iron overloaded prior to starting therapy and has not been heavily transfused.  Would follow iron parameters only if she develops significant transfusion dependence.    Infection prophylaxis:  If ANC <0.5 between cycles, would add levofloxacin 500 mg per day and fluconazole 400 mg per day when neutropenic but does not need those currently; acyclovir certainly reasonable.  --influenza vaccine today  --had PSV23 07/2016    See and discussed with Dr. Malen Gauze, attestation to follow.    Lunette Stands, MD  Hematology/Oncology Fellow  PGY-6  06/25/2017 9:03 AM

## 2017-06-25 NOTE — Unmapped (Signed)
Patient Information: Jessica Wilson is a 76 y.o. year-old female with AML who I am counseling today on enasidenib.     Pharmacy: Prescriptions sent to [facility]    Confirmed Address and Phone number: yes    Assessment/ Plan: Jessica Wilson is a 76 year old woman with AML IDH-2 who is going to start on enasidenib 100 mg once daily.    F/u: 2 weeks    I spent 15 minutes with Jessica Wilson.    Education points:  1. Adherence to oral chemotherapy was discussed. I explained to the patient that missing even one dose per week can compromise efficacy. If the patient is having difficulty remembering to take her dose there are a number of strategies that we can use in order to help.  Some of these include: using alarm clocks, mobile phone reminders, timing doses with certain activities of the day (brushing teeth, breakfast, dinner, etc), and having a friend or family be a reminder as well.  If patients continue to have difficulty remembering to take their oral chemotherapy, I will serve as their adherence coach by calling to see if they took their dose at regular intervals.       2. Food/drug Considerations: enasidenib can be taken with or without food.    3. Adverse effects:  Signs and symptoms of differentiation syndrome were reviewed.       4. Drug Drug Interactions:  None detected    Current Outpatient Prescriptions   Medication Sig Dispense Refill   ??? acyclovir (ZOVIRAX) 400 MG tablet Take 400 mg by mouth Two (2) times a day.      ??? buPROPion (WELLBUTRIN) 100 MG tablet Take 300 mg by mouth daily at 0600.      ??? clonazePAM (KLONOPIN) 0.5 MG tablet Take 1 mg by mouth two (2) times a day as needed for anxiety.      ??? enasidenib (IDHIFA) 100 mg tablet Take 1 tablet (100 mg total) by mouth daily. 30 tablet 6   ??? metFORMIN (GLUCOPHAGE) 500 MG tablet Take 500 mg by mouth nightly.      ??? rosuvastatin (CRESTOR) 20 MG tablet Take 20 mg by mouth daily.      ??? sertraline (ZOLOFT) 100 MG tablet Take 150 mg by mouth daily.      ??? umeclidinium-vilanterol (ANORO ELLIPTA) 62.5-25 mcg/actuation inhaler Inhale 1 puff daily.       Current Facility-Administered Medications   Medication Dose Route Frequency Provider Last Rate Last Dose   ??? flu vacc [id number] 6mos up(PF) (FLULAVAL, FLUZONE) 60 mcg (15 mcg x 4)/0.5 mL syringe                5. Initiation of Therapy Assessment:   -Complete medication list reviewed.  No issues were identified  -Past medical history reviewed.  -Allergies were reviewed  -Relevant cultural assessment and health literacy was completed.  Patient is able to store his/her medication as directed  -This patient does not have any cognitive or physical disabilities   -This patient does not speak any other languages.      6. The following was explained to the patient:  Advised patient of the following:  -A Welcome packet will be sent to the patient   -Assignment of Benefit for the patient to review and return before next refill  -Copay or out-of-pocket responsibility  -Arrangement of payment method can be done by contacting the pharmacy  -Take medications with during travel, have doctor's appointments, or  if being admitted to the hospital.  Advised patient of refill order process:  -Pharmacy must speak to the patient every month to receive medication  -Patient may call the pharmacy, or the pharmacy will call about a week before the refill is due    Patient verbalized understanding of the above information.

## 2017-06-25 NOTE — Unmapped (Signed)
Labs drawn from left [facility]. Pt left ambulatory and stable

## 2017-06-26 NOTE — Unmapped (Signed)
Per test claim for Idh[facility] at the [facility], approved for $8.35

## 2017-06-27 NOTE — Unmapped (Signed)
Phoned patient to discuss adding clinic visit next week to check in, after beginning new rx. Explained that as her medication was approved by insurance so quickly, and since she will begin the medication today, we would like to see her next week on 10/25 for clinic and labs. Patient agreeable, verbalized understanding. Pt states drug shipment has not arrived yet, however she is waiting for it and plans to take the medication as instructed once it arrives.     Patient does not have any questions at this time re medication however NN provided business hours # as well as after hours number should any questions/concerns come up now or over weekend. Patient read back numbers correctly, and will call if needed.

## 2017-06-30 ENCOUNTER — Inpatient Hospital Stay: Payer: Medicare Other

## 2017-06-30 ENCOUNTER — Inpatient Hospital Stay (HOSPITAL_BASED_OUTPATIENT_CLINIC_OR_DEPARTMENT_OTHER): Payer: Medicare Other | Admitting: Internal Medicine

## 2017-06-30 VITALS — BP 145/74 | HR 85 | Temp 97.5°F | Resp 20

## 2017-06-30 VITALS — BP 163/73 | HR 93 | Temp 98.1°F | Resp 16

## 2017-06-30 DIAGNOSIS — C9202 Acute myeloblastic leukemia, in relapse: Secondary | ICD-10-CM | POA: Diagnosis not present

## 2017-06-30 DIAGNOSIS — I129 Hypertensive chronic kidney disease with stage 1 through stage 4 chronic kidney disease, or unspecified chronic kidney disease: Secondary | ICD-10-CM

## 2017-06-30 DIAGNOSIS — Z7984 Long term (current) use of oral hypoglycemic drugs: Secondary | ICD-10-CM

## 2017-06-30 DIAGNOSIS — Z87891 Personal history of nicotine dependence: Secondary | ICD-10-CM

## 2017-06-30 DIAGNOSIS — Z79899 Other long term (current) drug therapy: Secondary | ICD-10-CM

## 2017-06-30 DIAGNOSIS — E669 Obesity, unspecified: Secondary | ICD-10-CM

## 2017-06-30 DIAGNOSIS — Z881 Allergy status to other antibiotic agents status: Secondary | ICD-10-CM | POA: Diagnosis not present

## 2017-06-30 DIAGNOSIS — N183 Chronic kidney disease, stage 3 (moderate): Secondary | ICD-10-CM | POA: Diagnosis not present

## 2017-06-30 DIAGNOSIS — D4622 Refractory anemia with excess of blasts 2: Secondary | ICD-10-CM | POA: Diagnosis not present

## 2017-06-30 DIAGNOSIS — E1122 Type 2 diabetes mellitus with diabetic chronic kidney disease: Secondary | ICD-10-CM | POA: Diagnosis not present

## 2017-06-30 DIAGNOSIS — D46Z Other myelodysplastic syndromes: Secondary | ICD-10-CM

## 2017-06-30 DIAGNOSIS — K219 Gastro-esophageal reflux disease without esophagitis: Secondary | ICD-10-CM | POA: Diagnosis not present

## 2017-06-30 DIAGNOSIS — D649 Anemia, unspecified: Secondary | ICD-10-CM

## 2017-06-30 DIAGNOSIS — E785 Hyperlipidemia, unspecified: Secondary | ICD-10-CM

## 2017-06-30 LAB — CBC WITH DIFFERENTIAL/PLATELET
BASOS PCT: 0 %
Basophils Absolute: 0 10*3/uL (ref 0–0.1)
EOS PCT: 0 %
Eosinophils Absolute: 0 10*3/uL (ref 0–0.7)
HEMATOCRIT: 22.5 % — AB (ref 35.0–47.0)
Hemoglobin: 7.6 g/dL — ABNORMAL LOW (ref 12.0–16.0)
LYMPHS ABS: 2.8 10*3/uL (ref 1.0–3.6)
Lymphocytes Relative: 66 %
MCH: 32.1 pg (ref 26.0–34.0)
MCHC: 33.8 g/dL (ref 32.0–36.0)
MCV: 95.1 fL (ref 80.0–100.0)
MONO ABS: 0.3 10*3/uL (ref 0.2–0.9)
MONOS PCT: 7 %
NEUTROS ABS: 1.1 10*3/uL — AB (ref 1.4–6.5)
Neutrophils Relative %: 27 %
PLATELETS: 48 10*3/uL — AB (ref 150–440)
RBC: 2.37 MIL/uL — ABNORMAL LOW (ref 3.80–5.20)
RDW: 18.5 % — AB (ref 11.5–14.5)
WBC: 4.2 10*3/uL (ref 3.6–11.0)

## 2017-06-30 LAB — PREPARE RBC (CROSSMATCH)

## 2017-06-30 LAB — SAMPLE TO BLOOD BANK

## 2017-06-30 MED ORDER — ACETAMINOPHEN 325 MG PO TABS
ORAL_TABLET | ORAL | Status: AC
  Filled 2017-06-30: qty 2

## 2017-06-30 MED ORDER — DIPHENHYDRAMINE HCL 25 MG PO CAPS
ORAL_CAPSULE | ORAL | Status: AC
  Filled 2017-06-30: qty 1

## 2017-06-30 MED ORDER — SODIUM CHLORIDE 0.9 % IV SOLN
250.0000 mL | Freq: Once | INTRAVENOUS | Status: AC
  Administered 2017-06-30: 250 mL via INTRAVENOUS
  Filled 2017-06-30: qty 250

## 2017-06-30 MED ORDER — DIPHENHYDRAMINE HCL 25 MG PO CAPS
25.0000 mg | ORAL_CAPSULE | Freq: Once | ORAL | Status: AC
  Administered 2017-06-30: 25 mg via ORAL

## 2017-06-30 MED ORDER — ACETAMINOPHEN 325 MG PO TABS
650.0000 mg | ORAL_TABLET | Freq: Once | ORAL | Status: AC
Start: 2017-06-30 — End: 2017-06-30
  Administered 2017-06-30: 650 mg via ORAL

## 2017-06-30 NOTE — Assessment & Plan Note (Addendum)
Bone marrow biopsy September 2018 shows- ACUTE MYELOID LEUKEMIA [38% blasts]; and also the peripheral blood. IDH- positive; currently on Enasidenib 100mg/day [started on 10/19; [facility]; Dr.Foster]. Reviewed the note from Dr. Foster. Appreciate his recommendations.   # Today hemoglobin is 7.6/symtomatic; proceed with blood transfusion. White count 4.2; platalets- 48. Monitor labs on a weekly basis. She understands treatments are still palliative; not curative.  #CKD stage III-stable.  # Prophylactic antibiotics- acyclovir/ diflucan/continue levaquin.   # weekly cbc/ hold tube; follow up in 4 weeks/labs/hold tube.  

## 2017-06-30 NOTE — Progress Notes (Signed)
[place] NOTE  Patient Care Team: Teresa Nova, [name]  CHIEF COMPLAINTS/PURPOSE OF CONSULTATION:   Oncology History   #JUNE 2017- Severe neutropenia/ Anemia ~hb 9/platlets- 85-100 AUG 2017- REFRACTORY ANEMIA with EXCESS BLASTS [14% blasts- BMBx]; cytogenetics/FISH-N [SNP micorarray- not done]   # AUG 21st 2017-  START AZA 71m/m2 day- 1-7 q 28 days x4 cycles; DEC 6th- BMBx- <5% blasts; hypercellular with dysplasia.   # SEP 20th 2018- ACUTE MYELOID LEUKEMIA [38% blasts on BMBx]; IDH-MUTATION PRESENT; low FLT-3; OCT 18th 2018- ENASIDENIB [Dr.Foster; [facility]]   # CKD stage III  ------------------------------------------------------   MOLECULAR TESTING: NGS sent 06/2017 -FLT3-ITD <0.05 mutational burden -IDH2 -DNMT3A --treatment options include FLT3 inhibition and IDH2 inhibition; given low level FYFV4-BSWallelic ratio [admittedly in peripheral blood, not bone marrow]- started on ENASIDENIB     MDS (myelodysplastic syndrome), high grade ([place]   04/23/2016 Initial Diagnosis    MDS (myelodysplastic syndrome), high grade (HCC)       Acute myeloid leukemia in relapse (HCC)      HISTORY OF PRESENTING ILLNESS:  [name]89y.o.  female with above history of  MDS/high-grade- On a recent bone marrow biopsy noted to have progression to acute myeloid leukemia. Interestingly patient [facility]   Patient has been started on Enasidenib. Patient is started on the pill approximately 4 days ago. She is tolerating the pill fairly well. No nausea no vomiting or diarrhea.  She does complain of moderate to severe fatigue. Denies any fevers or chills. Shortness of breath or cough. Denies any gum bleeding.   ROS: A complete 10 point review of system is done which is negative except mentioned above in history of present illness.  MEDICAL HISTORY:  Past Medical History:  Diagnosis Date  . Abdominal  wall mass   . [place]inal pain ([place]   . Anxiety   . Arthritis   . Calculus of kidney   . Cystitis   . Depression   . Diabetes mellitus without complication (HCC)    elevated A1c  . Dyspnea on exertion   . Elevated serum creatinine   . Fibrocystic breast disease   . GERD (gastroesophageal reflux disease)   . Hearing loss   . Heart murmur   . HTN (hypertension)   . Hyperlipidemia   . MDS (myelodysplastic syndrome), high grade ([place] 04/23/2016  . Microscopic hematuria   . Mouth sores   . Mucositis due to chemotherapy   . Obesity   . Risk for falls   . Sleep apnea   . Thrombocytopenia ([place]   . Urinary frequency   . Urinary urgency     SURGICAL HISTORY: Past Surgical History:  Procedure Laterality Date  . ABDOMINAL HYSTERECTOMY    . APPENDECTOMY    . CARDIAC CATHETERIZATION     x2  . CHOLECYSTECTOMY    . COLONOSCOPY N/A 02/24/2015   Procedure: COLONOSCOPY;  Surgeon: RManya Silvas MD;  [facility];  Service: Endoscopy;  Laterality: N/A;  . DIAGNOSTIC LAPAROSCOPY     Removal of benign abdominal tumor  . ESOPHAGOGASTRODUODENOSCOPY N/A 02/24/2015   Procedure: ESOPHAGOGASTRODUODENOSCOPY (EGD);  Surgeon: RManya Silvas MD;  [facility];  Service: Endoscopy;  Laterality: N/A;  . right eye surgery Right     SOCIAL HISTORY: [place] family[place] No smoking/ no alcohol.  Social History   Social History  .  Marital status: Married    Spouse name: N/A  . Number of children: N/A  . Years of education: N/A   Occupational History  . Not on file.   Social History Main Topics  . Smoking status: Former Smoker    Quit date: 09/09/1988  . Smokeless tobacco: Never Used     Comment: quit 25 years ago  . Alcohol use No  . Drug use: No  . Sexual activity: Not Currently    Birth control/ protection: Post-menopausal   Other Topics Concern  . Not on file   Social History Narrative  . No narrative on file    FAMILY  HISTORY: no cancers in family.  Family History  Problem Relation Age of Onset  . Congestive Heart Failure Mother   . Diabetes Mother   . Coronary artery disease Mother   . Stroke Mother   . Cirrhosis Father     ALLERGIES:  is allergic to macrobid [nitrofurantoin monohyd macro].  MEDICATIONS:  Current Outpatient Prescriptions  Medication Sig Dispense Refill  . acyclovir (ZOVIRAX) 400 MG tablet TAKE 1 TABLET(400 MG) BY MOUTH TWICE DAILY 180 tablet 0  . buPROPion (WELLBUTRIN XL) 150 MG 24 hr tablet Take 300 mg by mouth daily at 12 noon.     . clonazePAM (KLONOPIN) 0.5 MG tablet Take 0.5 mg by mouth 2 (two) times daily.     . Enasidenib Mesylate 100 MG TABS Take 100 mg by mouth.    . metFORMIN (GLUCOPHAGE) 500 MG tablet Take 1 tablet (500 mg total) by mouth daily. 90 tablet 0  . ondansetron (ZOFRAN-ODT) 4 MG disintegrating tablet Take 4 mg by mouth every 8 (eight) hours as needed for nausea or vomiting.    . prochlorperazine (COMPAZINE) 10 MG tablet Take 1 tablet (10 mg total) by mouth every 6 (six) hours as needed (Nausea or vomiting). 30 tablet 1  . rosuvastatin (CRESTOR) 20 MG tablet TAKE 1 TABLET(20 MG) BY MOUTH AT BEDTIME 90 tablet 0  . sertraline (ZOLOFT) 100 MG tablet Take 100 mg by mouth daily at 12 noon.     Marland Kitchen Umeclidinium-Vilanterol (ANORO ELLIPTA) 62.5-25 MCG/INH AEPB Inhale 1 puff into the lungs daily as needed (shortness of breath).     Marland Kitchen albuterol (PROVENTIL HFA) 108 (90 Base) MCG/ACT inhaler Inhale into the lungs.    . cyclopentolate (CYCLODRYL,CYCLOGYL) 1 % ophthalmic solution INT 1 GTT IN OS BID  0  . diltiazem (CARDIZEM CD) 180 MG 24 hr capsule Take 1 capsule (180 mg total) by mouth daily. (Patient not taking: Reported on 06/30/2017) 30 capsule 0  . hydrocortisone (ANUSOL-HC) 2.5 % rectal cream Place 1 application rectally 2 (two) times daily as needed for hemorrhoids or itching. (Patient not taking: Reported on 06/30/2017) 30 g 0  . levofloxacin (LEVAQUIN) 500 MG tablet  Take 1 tablet (500 mg total) by mouth daily. (Patient not taking: Reported on 06/30/2017) 30 tablet 0  . LUMIGAN 0.01 % SOLN Place 1 drop into both eyes at bedtime.     . magic mouthwash w/lidocaine SOLN Take 5 mLs by mouth 4 (four) times daily. 80 ml viscous lidocaine 2%, 80 ml Mylanta, 80 ml Diphenhydramine 12.5 mg/5 ml Elixir, 80 ml Nystatin 100,000 Unit suspension, 80 ml Prednisolone 15 mg/37m, 80 ml Distilled Water.  Sig: Swish/Swallow 5-10 ml four times a day as needed. Dispense 480 ml. 3RFs (Patient not taking: Reported on 06/30/2017) 480 mL 3  . Multiple Vitamin (MULTIVITAMIN WITH MINERALS) TABS tablet Take 1 tablet by mouth daily.    .Marland Kitchen  Omega-3 Fatty Acids (FISH OIL) 1000 MG CAPS Take 1 capsule by mouth daily.    Marland Kitchen omeprazole (PRILOSEC) 40 MG capsule     . polyethylene glycol (MIRALAX / GLYCOLAX) packet Take 17 g by mouth daily as needed for mild constipation. (Patient not taking: Reported on 06/30/2017) 14 each 1   No current facility-administered medications for this visit.       Marland Kitchen  PHYSICAL EXAMINATION: ECOG PERFORMANCE STATUS: 1 - Symptomatic but completely ambulatory  Vitals:   06/30/17 1023  BP: (!) 163/73  Pulse: 93  Resp: 16  Temp: 98.1 F (36.7 C)   There were no vitals filed for this visit.  GENERAL: Well-nourished well-developed; Alert, no distress and comfortable.   Obese. Accompanied by her husband/sister. She is walking by herself. EYES: Positive for pallor. OROPHARYNX: no thrush or ulceration;  NECK: supple, no masses felt LYMPH:  no palpable lymphadenopathy in the cervical, axillary or inguinal regions LUNGS: clear to auscultation and  No wheeze or crackles HEART/CVS: regular rate & rhythm and no murmurs; No lower extremity edema ABDOMEN: abdomen soft, non-tender and normal bowel sounds Musculoskeletal:no cyanosis of digits and no clubbing  PSYCH: alert & oriented x 3 with fluent speech NEURO: no focal motor/sensory deficits SKIN:  No skin rash/nodules.   LABORATORY DATA:  I have reviewed the data as listed Lab Results  Component Value Date   WBC 4.2 06/30/2017   HGB 7.6 (L) 06/30/2017   HCT 22.5 (L) 06/30/2017   MCV 95.1 06/30/2017   PLT 48 (L) 06/30/2017    Recent Labs  04/21/17 1200 04/28/17 0929 06/02/17 0828 06/23/17 0955  NA 142 143 140 140  K 4.6 4.9 4.3 3.6  CL 109 110 106 104  CO2 27 25 26 25   GLUCOSE 140* 130* 117* 145*  BUN 19 26* 22* 17  CREATININE 1.19* 1.41* 1.26* 1.13*  CALCIUM 9.1 9.2 9.5 9.1  GFRNONAA 44* 35* 41* 46*  GFRAA 50* 41* 47* 54*  PROT 7.1 7.5 7.5  --   ALBUMIN 3.8 3.9 3.8  --   AST 18 18 21   --   ALT 12* 16 20  --   ALKPHOS 67 70 84  --   BILITOT 0.5 0.5 0.5  --     RADIOGRAPHIC STUDIES: I have personally reviewed the radiological images as listed and agreed with the findings in the report. No results found.  ASSESSMENT & PLAN:   Acute myeloid leukemia in relapse ([place]) Bone marrow biopsy September 2018 shows- ACUTE MYELOID LEUKEMIA [38% blasts]; and also the peripheral blood. IDH- positive; currently on Enasidenib 127m/day [started on 10/19; [facility]; Dr.Foster]. Reviewed the note from Dr. FRoyce Macadamia Appreciate his recommendations.   # Today hemoglobin is 7.6/symtomatic; proceed with blood transfusion. White count 4.2; platalets- 48. Monitor labs on a weekly basis. She understands treatments are still palliative; not curative.  #CKD stage III-stable.  # Prophylactic antibiotics- acyclovir/ diflucan/continue levaquin.   # weekly cbc/ hold tube; follow up in 4 weeks/labs/hold tube.      GCammie Sickle MD 07/01/2017 9:57 AM

## 2017-06-30 NOTE — Progress Notes (Signed)
Here for follow up. Needs renewal of magic mouthwash she stated,.

## 2017-07-01 LAB — TYPE AND SCREEN
ABO/RH(D): A POS
ANTIBODY SCREEN: NEGATIVE
UNIT DIVISION: 0
Unit division: 0

## 2017-07-01 LAB — BPAM RBC
BLOOD PRODUCT EXPIRATION DATE: 201810292359
Blood Product Expiration Date: 201811012359
ISSUE DATE / TIME: 201810221225
UNIT TYPE AND RH: 600
Unit Type and Rh: 600

## 2017-07-03 ENCOUNTER — Ambulatory Visit
Admission: RE | Admit: 2017-07-03 | Discharge: 2017-07-03 | Disposition: A | Discharge status: Home / Self Care | Payer: MEDICARE

## 2017-07-03 ENCOUNTER — Ambulatory Visit
Admission: RE | Admit: 2017-07-03 | Discharge: 2017-07-03 | Disposition: A | Discharge status: Home / Self Care | Payer: MEDICARE | Attending: Nurse Practitioner

## 2017-07-03 DIAGNOSIS — C9200 Acute myeloblastic leukemia, not having achieved remission: Secondary | ICD-10-CM

## 2017-07-03 DIAGNOSIS — C9202 Acute myeloblastic leukemia, in relapse: Principal | ICD-10-CM

## 2017-07-03 DIAGNOSIS — R350 Frequency of micturition: Principal | ICD-10-CM

## 2017-07-03 DIAGNOSIS — Z87891 Personal history of nicotine dependence: Secondary | ICD-10-CM | POA: Diagnosis not present

## 2017-07-03 DIAGNOSIS — I10 Essential (primary) hypertension: Secondary | ICD-10-CM | POA: Diagnosis not present

## 2017-07-03 LAB — COMPREHENSIVE METABOLIC PANEL
ALBUMIN: 3.9 g/dL (ref 3.5–5.0)
ALKALINE PHOSPHATASE: 94 U/L (ref 38–126)
ALT (SGPT): 30 U/L (ref 15–48)
AST (SGOT): 22 U/L (ref 14–38)
BILIRUBIN TOTAL: 1 mg/dL (ref 0.0–1.2)
BLOOD UREA NITROGEN: 15 mg/dL (ref 7–21)
BUN / CREAT RATIO: 13
CALCIUM: 9 mg/dL (ref 8.5–10.2)
CHLORIDE: 102 mmol/L (ref 98–107)
CO2: 31 mmol/L — ABNORMAL HIGH (ref 22.0–30.0)
CREATININE: 1.13 mg/dL — ABNORMAL HIGH (ref 0.60–1.00)
EGFR MDRD NON AF AMER: 47 mL/min/{1.73_m2} — ABNORMAL LOW (ref >=60)
GLUCOSE RANDOM: 123 mg/dL (ref 65–179)
POTASSIUM: 4.1 mmol/L (ref 3.5–5.0)
PROTEIN TOTAL: 7.4 g/dL (ref 6.5–8.3)
SODIUM: 144 mmol/L (ref 135–145)

## 2017-07-03 LAB — URINALYSIS WITH CULTURE REFLEX
BILIRUBIN UA: NEGATIVE
GLUCOSE UA: NEGATIVE
KETONES UA: NEGATIVE
LEUKOCYTE ESTERASE UA: NEGATIVE
NITRITE UA: NEGATIVE
PH UA: 7 (ref 5.0–9.0)
RBC UA: 8 /HPF — ABNORMAL HIGH (ref <4)
SPECIFIC GRAVITY UA: 1.007 (ref 1.003–1.030)
SQUAMOUS EPITHELIAL: <1 /HPF (ref 0–5)
UROBILINOGEN UA: 0.2
WBC UA: 1 /HPF (ref 0–5)

## 2017-07-03 LAB — CBC W/ AUTO DIFF
HEMATOCRIT: 25.4 % — ABNORMAL LOW (ref 36.0–46.0)
HEMOGLOBIN: 8.7 g/dL — ABNORMAL LOW (ref 12.0–16.0)
MEAN CORPUSCULAR HEMOGLOBIN: 32.2 pg (ref 26.0–34.0)
MEAN CORPUSCULAR VOLUME: 94 fL (ref 80.0–100.0)
NUCLEATED RED BLOOD CELLS: 2 /100{WBCs} (ref <=4)
PLATELET COUNT: 47 10*9/L — ABNORMAL LOW (ref 150–440)
RED BLOOD CELL COUNT: 2.7 10*12/L — ABNORMAL LOW (ref 4.00–5.20)
RED CELL DISTRIBUTION WIDTH: 19.6 % — ABNORMAL HIGH (ref 12.0–15.0)
WBC ADJUSTED: 4.4 10*9/L — ABNORMAL LOW (ref 4.5–11.0)

## 2017-07-03 LAB — MANUAL DIFFERENTIAL
BASOPHILS - ABS (DIFF): 0 10*9/L (ref 0.0–0.1)
BLASTS - REL (DIFF): 36 % (ref <=0)
EOSINOPHILS - ABS (DIFF): 0 10*9/L (ref 0.0–0.4)
LYMPHOCYTES - ABS (DIFF): 1.6 10*9/L (ref 1.5–5.0)
NEUTROPHILS - ABS (DIFF): 1.1 10*9/L — ABNORMAL LOW (ref 2.0–7.5)

## 2017-07-03 LAB — MEAN CORPUSCULAR VOLUME: Lab: 94

## 2017-07-03 LAB — PHOSPHORUS: Phosphate:MCnc:Pt:Ser/Plas:Qn:: 3.2

## 2017-07-03 LAB — URIC ACID: Urate:MCnc:Pt:Ser/Plas:Qn:: 6.3

## 2017-07-03 LAB — LACTATE DEHYDROGENASE: Lactate dehydrogenase:CCnc:Pt:Ser/Plas:Qn:: 661 — ABNORMAL HIGH

## 2017-07-03 LAB — POTASSIUM: Potassium:SCnc:Pt:Ser/Plas:Qn:: 4.1

## 2017-07-03 LAB — LYMPHOCYTES - ABS (DIFF): Lab: 1.6

## 2017-07-03 LAB — GLUCOSE UA: Lab: NEGATIVE

## 2017-07-03 MED ORDER — ALLOPURINOL 300 MG TABLET
ORAL_TABLET | Freq: Every day | ORAL | 0 refills | 0.00000 days | Status: CP
Start: 2017-07-03 — End: 2017-10-20

## 2017-07-03 MED ORDER — ALLOPURINOL 300 MG TABLET: tablet | 0 refills | 0 days | Status: AC

## 2017-07-03 MED ORDER — TRAMADOL 50 MG TABLET
ORAL_TABLET | 0 refills | 0 days | Status: CP
Start: 2017-07-03 — End: 2017-09-18

## 2017-07-03 NOTE — Unmapped (Signed)
Wants 90 day supply

## 2017-07-03 NOTE — Unmapped (Signed)
Lab drawn and sent for analysis.

## 2017-07-03 NOTE — Unmapped (Addendum)
1. Elevated blood pressures:  - Keep a log of your blood pressures this week. Bring it to your clinic appt next week.    2. Your urine doesn't look like you have a urinary tract infection. Obviously, if you develop symptoms of burning or have continued frequency, you should be evaluated again.    3. Hip/leg pain:  - I normally would like to start with ibuprofen. However, your kidneys may need to work over time to help flush any dying cancer cells. Because of that, I'm hesitant to give you ibuprofen, which can make kidney function worsen.  - Let's try very low dose tramadol 25 mg - one to two times per day.  - If this isn't helpful, give Romeo Apple a call and we can modify the plan.    4. Tumor lysis:  - As we discussed, some of the cancer cells are dying. To protect your kidneys, I'd like to start you on allopurinol.     We will see you next week.    Office Visit on 07/03/2017   Component Date Value Ref Range Status   ??? Color, UA 07/03/2017 Yellow   Final   ??? Clarity, UA 07/03/2017 Clear   Final   ??? Specific Gravity, UA 07/03/2017 1.007  1.003 - 1.030 Final   ??? pH, UA 07/03/2017 7.0  5.0 - 9.0 Final   ??? Leukocyte Esterase, UA 07/03/2017 Negative  Negative Final   ??? Nitrite, UA 07/03/2017 Negative  Negative Final   ??? Protein, UA 07/03/2017 30 mg/dL* Negative Final   ??? Glucose, UA 07/03/2017 Negative  Negative Final   ??? Ketones, UA 07/03/2017 Negative  Negative Final   ??? Urobilinogen, UA 07/03/2017 0.2 mg/dL  0.2 mg/dL, 1.0 mg/dL Final   ??? Bilirubin, UA 07/03/2017 Negative  Negative Final   ??? Blood, UA 07/03/2017 Moderate* Negative Final   ??? RBC, UA 07/03/2017 8* <4 /HPF Final   ??? WBC, UA 07/03/2017 1  0 - 5 /HPF Final   ??? Squam Epithel, UA 07/03/2017 <1  0 - 5 /HPF Final   ??? Bacteria, UA 07/03/2017 Rare* None Seen /HPF Final   ??? Mucus, UA 07/03/2017 Rare* None Seen /HPF Final   Lab on 07/03/2017   Component Date Value Ref Range Status   ??? LDH 07/03/2017 661* 338 - 610 U/L Final   ??? Uric Acid 07/03/2017 6.3  3.0 - 6.5 mg/dL Final   ??? Phosphorus 07/03/2017 3.2  2.9 - 4.7 mg/dL Final   ??? Sodium 09/81/1914 144  135 - 145 mmol/L Final   ??? Potassium 07/03/2017 4.1  3.5 - 5.0 mmol/L Final   ??? Chloride 07/03/2017 102  98 - 107 mmol/L Final   ??? CO2 07/03/2017 31.0* 22.0 - 30.0 mmol/L Final   ??? BUN 07/03/2017 15  7 - 21 mg/dL Final   ??? Creatinine 07/03/2017 1.13* 0.60 - 1.00 mg/dL Final   ??? BUN/Creatinine Ratio 07/03/2017 13   Final   ??? EGFR MDRD Non Af Amer 07/03/2017 47* >=60 mL/min/1.65m2 Final   ??? EGFR MDRD Af Amer 07/03/2017 57* >=60 mL/min/1.29m2 Final   ??? Anion Gap 07/03/2017 11  9 - 15 mmol/L Final   ??? Glucose 07/03/2017 123  65 - 179 mg/dL Final   ??? Calcium 78/29/5621 9.0  8.5 - 10.2 mg/dL Final   ??? Albumin 30/86/5784 3.9  3.5 - 5.0 g/dL Final   ??? Total Protein 07/03/2017 7.4  6.5 - 8.3 g/dL Final   ??? Total Bilirubin 07/03/2017 1.0  0.0 - 1.2 mg/dL Final   ??? AST 45/40/9811 22  14 - 38 U/L Final   ??? ALT 07/03/2017 30  15 - 48 U/L Final   ??? Alkaline Phosphatase 07/03/2017 94  38 - 126 U/L Final   ??? WBC 07/03/2017 4.4* 4.5 - 11.0 10*9/L Final   ??? RBC 07/03/2017 2.70* 4.00 - 5.20 10*12/L Final   ??? HGB 07/03/2017 8.7* 12.0 - 16.0 g/dL Final   ??? HCT 91/47/8295 25.4* 36.0 - 46.0 % Final   ??? MCV 07/03/2017 94.0  80.0 - 100.0 fL Final   ??? MCH 07/03/2017 32.2  26.0 - 34.0 pg Final   ??? MCHC 07/03/2017 34.2  31.0 - 37.0 g/dL Final   ??? RDW 62/13/0865 19.6* 12.0 - 15.0 % Final   ??? MPV 07/03/2017 9.2  7.0 - 10.0 fL Final   ??? Platelet 07/03/2017 47* 150 - 440 10*9/L Final   ??? nRBC 07/03/2017 2  <=4 /100 WBCs Final   ??? Variable HGB Concentration 07/03/2017 Moderate* Not Present Final   ??? Macrocytosis 07/03/2017 Moderate* Not Present Final   ??? Anisocytosis 07/03/2017 Moderate* Not Present Final   ??? Hypochromasia 07/03/2017 Slight* Not Present Final   ??? Blasts % 07/03/2017 36* <=0 % Final   ??? Absolute Neutrophils 07/03/2017 1.1* 2.0 - 7.5 10*9/L Final   ??? Absolute Lymphocytes 07/03/2017 1.6  1.5 - 5.0 10*9/L Final   ??? Absolute Monocytes 07/03/2017 0.1* 0.2 - 0.8 10*9/L Final   ??? Absolute Eosinophils 07/03/2017 0.0  0.0 - 0.4 10*9/L Final   ??? Absolute Basophils 07/03/2017 0.0  0.0 - 0.1 10*9/L Final   ??? Smear Review Comments 07/03/2017 See Comment* Undefined Final    Blasts Present. Myelocytes present. Agranular neutrophils present.    ??? Polychromasia 07/03/2017 Slight* Not Present Final

## 2017-07-03 NOTE — Unmapped (Signed)
[facility]ukemia [facility] Follow-up    Patient Name: Jessica Wilson  Patient Age: 76 y.o.  Encounter Date: 07/03/2017    Primary Care Provider:  Tyson Dense, MD    Referring Physician:  Per Patient Referring Unknown  No address on file    Reason for visit:  F/u MDS, progressed to AML, newly started on Idhifa    History of Present Illness:  We had the pleasure of seeing Jessica Wilson in the Leukemia Clinic at the [place] [facility] [place] on 07/03/2017.  She is a 76 y.o. 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      Current therapy is Idhifa 100 mg by mouth daily.    Her oncologic history is as follows:    Oncology History    Referring/Local Oncologist: Dr. Louretta Shorten, [facility]    Diagnosis: MDS with Excess Blasts-2    Genetics:   Karyotype/FISH: 46XX     Molecular Genetics: not performed    Pertinent Phenotypic data: no aberrant immunophenotype by flow cytometry, however blasts stained by IHC for CD117, MPO.  Only 5% marrow cells stained for CD34.    Disease-specific prognostic estimation: IPSS-R high risk, median OS 1.6 years, with 25% AML risk at 1.4 years            MDS (myelodysplastic syndrome), high grade (CMS-HCC)    04/17/2016 Initial Diagnosis     MDS (myelodysplastic syndrome), high grade (RAF-HCC)       04/29/2016 -  Chemotherapy     Azacitidine cycle 1: 75 mg/m2 [place] days 1-7 of 28-day cycles         05/27/2016 -  Chemotherapy     Azacitidine cycle 2: 75 mg/m2 [place] days 1-7 of 28 day cycles         06/24/2016 -  Chemotherapy     Azacitidine cycle 3: 75 mg/m2 [place] days 1-7 of 28 day cycle         07/22/2016 -  Chemotherapy     Azacitidine cycle 4: 75 mg/m2 [place] days 1-7 of 28 day cycle         08/19/2016 -  Chemotherapy     Azacitidine cycle 5: 75 mg/m2 [place] x 7 days of 28 day cycle         09/16/2016 -  Chemotherapy     Azacitidine cycle 6: 75 mbg/m2 [place] x 7 days of 28 day cycle         10/14/2016 -  Chemotherapy     Azacitidine cycle 7: 75 mg/m2 [place] x 7 days of 28 day cycle 12/09/2016 -  Chemotherapy     Azacitidine cycle 8: 60 mg/m2 [place] x 7 days of 28 day (cycle reduced by 20% due to hematologic toxicity)         01/13/2017 -  Chemotherapy     Azacitidine cycle 9: 60 mg/m2 [place] x 5 days of 28 day cycle (reduced by 2 days and 20% per dose due to hematologic toxicity)         05/29/2017 Progression     Given increasing transfusion requirements, repeat bone marrow biopsy done, now with 35% blasts and meets criteria for progression to AML.           Acute myeloid leukemia not having achieved remission (CMS-HCC)    06/02/2017 Initial Diagnosis     Acute myeloid leukemia not having achieved remission         06/28/2017 -  Chemotherapy  Idfhifa 100 mg by mouth daily          Interim History:  Since last seen, Jessica Wilson started on Idhifa. Says her appetite has been pretty good and weight is about the same. She has been most bothered by pain in her right hip that extends all the way down her leg. Says that she couldn't sleep last night because of the pain. Tylenol and a heating pad have not helped. Today the pain is 6-7/10.    Denies fevers. Has had some gum bleeding for the past three days when she wakes up in the morning. She had the same pattern when her platelets were low on vidaza. Says that she got up four times to urinate last night which is very unusual for her and she wonders if she has a urinary tract infection. Denies dysuria.    Denies headache. Denies chest pain. Denies SOB at rest, but has DOE/palpitations with activity. This is not new for her. Denies abd pain/n/v/d/c. Denies LE edema. Denies rash. Continues with once weekly labs on Mondays locally.    Otherwise, she denies new constitutional symptoms such as anorexia, weight loss night sweats or unexplained fevers.  Furthermore, she denies symptoms of marrow failure: unexplained bleeding or bruising, recurrent or unexplained intercurrent infections, dyspnea on exertion, lightheadedness, palpitations or chest pain.  There have been no new or unexplained pains or self-identified masses, swelling or enlarged lymph nodes.    Past Medical, Surgical and Family History were reviewed and pertinent updates were made in the Electronic Medical Record    Review of Systems:  Other than as reported above in the interim history, all other systems were negative.    ECOG Performance Status: 2    Medications:    Current Outpatient Prescriptions   Medication Sig Dispense Refill   ??? acyclovir (ZOVIRAX) 400 MG tablet Take 400 mg by mouth Two (2) times a day.      ??? buPROPion (WELLBUTRIN) 100 MG tablet Take 300 mg by mouth daily at 0600.      ??? clonazePAM (KLONOPIN) 0.5 MG tablet Take 1 mg by mouth two (2) times a day as needed for anxiety.      ??? enasidenib (IDHIFA) 100 mg tablet Take 1 tablet (100 mg total) by mouth daily. 30 tablet 6   ??? metFORMIN (GLUCOPHAGE) 500 MG tablet Take 500 mg by mouth nightly.      ??? rosuvastatin (CRESTOR) 20 MG tablet Take 20 mg by mouth daily.      ??? sertraline (ZOLOFT) 100 MG tablet Take 150 mg by mouth daily.      ??? umeclidinium-vilanterol (ANORO ELLIPTA) 62.5-25 mcg/actuation inhaler Inhale 1 puff daily.     ??? allopurinol (ZYLOPRIM) 300 MG tablet TAKE 1 TABLET(300 MG) BY MOUTH DAILY 90 tablet 0   ??? traMADol (ULTRAM) 50 mg tablet Take 1/2 tablet (25 mg) one to two times daily as needed for pain. 5 tablet 0     No current facility-administered medications for this visit.      Vital Signs:  Vitals:    07/03/17 0809   BP: 153/65   Pulse: 78   Resp: 18   Temp: 37.3 ??C (99.2 ??F)   SpO2: 94%     Physical Exam:  General: Resting, in no apparent distress  HEENT:  PERRL. No scleral icterus or conjunctival injection. Oral mucosa without ulceration, erythema or exudate.   Lymph node exam:  No lymphadenopathy in the anterior/posterior cervical, supraclavicular, axillary basins.  Heart:  RRR.  S1, S2.  No murmurs, gallops or rubs.  Lungs:  Breathing is unlabored, and patient is speaking full sentences with ease.  No stridor.  CTAB. No rales, ronchi or crackles.    GI:  No distention or pain on palpation.  Bowel sounds are present and normal in quality.  No palpable hepatomegaly or splenomegaly.  No palpable masses.  Skin:  No rashes, petechiae or purpura.  No areas of skin breakdown. Warm to touch, dry, smooth and even.  Musculoskeletal:  Pain elicited on palpation of left posterior pelvis, hip, leg. No grossly-evident joint effusions or deformities.  Range of motion about the shoulder, elbow, hips and knees is grossly normal.   Psychiatric:  Alert and oriented to person, place, time and situation.  Range of affect is appropriate.    Neurologic:  Gait is normal.  Cerebellar tasks are completed with ease and are symmetric.  Extremities:  Appear well-perfused.  No clubbing, edema or cyanosis.    Relevant Laboratory, radiology and pathology results:  I personally viewed the most recent external/internal records and labs and discussed the available results with the patient or family.  A summary of results follows:    Office Visit on 07/03/2017   Component Date Value Ref Range Status   ??? Color, UA 07/03/2017 Yellow   Final   ??? Clarity, UA 07/03/2017 Clear   Final   ??? Specific Gravity, UA 07/03/2017 1.007  1.003 - 1.030 Final   ??? pH, UA 07/03/2017 7.0  5.0 - 9.0 Final   ??? Leukocyte Esterase, UA 07/03/2017 Negative  Negative Final   ??? Nitrite, UA 07/03/2017 Negative  Negative Final   ??? Protein, UA 07/03/2017 30 mg/dL* Negative Final   ??? Glucose, UA 07/03/2017 Negative  Negative Final   ??? Ketones, UA 07/03/2017 Negative  Negative Final   ??? Urobilinogen, UA 07/03/2017 0.2 mg/dL  0.2 mg/dL, 1.0 mg/dL Final   ??? Bilirubin, UA 07/03/2017 Negative  Negative Final   ??? Blood, UA 07/03/2017 Moderate* Negative Final   ??? RBC, UA 07/03/2017 8* <4 /HPF Final   ??? WBC, UA 07/03/2017 1  0 - 5 /HPF Final   ??? Squam Epithel, UA 07/03/2017 <1  0 - 5 /HPF Final   ??? Bacteria, UA 07/03/2017 Rare* None Seen /HPF Final   ??? Mucus, UA 07/03/2017 Rare* None Seen /HPF Final   Lab on 07/03/2017   Component Date Value Ref Range Status   ??? LDH 07/03/2017 661* 338 - 610 U/L Final   ??? Uric Acid 07/03/2017 6.3  3.0 - 6.5 mg/dL Final   ??? Phosphorus 07/03/2017 3.2  2.9 - 4.7 mg/dL Final   ??? Sodium 09/81/1914 144  135 - 145 mmol/L Final   ??? Potassium 07/03/2017 4.1  3.5 - 5.0 mmol/L Final   ??? Chloride 07/03/2017 102  98 - 107 mmol/L Final   ??? CO2 07/03/2017 31.0* 22.0 - 30.0 mmol/L Final   ??? BUN 07/03/2017 15  7 - 21 mg/dL Final   ??? Creatinine 07/03/2017 1.13* 0.60 - 1.00 mg/dL Final   ??? BUN/Creatinine Ratio 07/03/2017 13   Final   ??? EGFR MDRD Non Af Amer 07/03/2017 47* >=60 mL/min/1.76m2 Final   ??? EGFR MDRD Af Amer 07/03/2017 57* >=60 mL/min/1.15m2 Final   ??? Anion Gap 07/03/2017 11  9 - 15 mmol/L Final   ??? Glucose 07/03/2017 123  65 - 179 mg/dL Final   ??? Calcium 78/29/5621 9.0  8.5 - 10.2 mg/dL Final   ??? Albumin  07/03/2017 3.9  3.5 - 5.0 g/dL Final   ??? Total Protein 07/03/2017 7.4  6.5 - 8.3 g/dL Final   ??? Total Bilirubin 07/03/2017 1.0  0.0 - 1.2 mg/dL Final   ??? AST 16/06/9603 22  14 - 38 U/L Final   ??? ALT 07/03/2017 30  15 - 48 U/L Final   ??? Alkaline Phosphatase 07/03/2017 94  38 - 126 U/L Final   ??? WBC 07/03/2017 4.4* 4.5 - 11.0 10*9/L Final   ??? RBC 07/03/2017 2.70* 4.00 - 5.20 10*12/L Final   ??? HGB 07/03/2017 8.7* 12.0 - 16.0 g/dL Final   ??? HCT 54/05/8118 25.4* 36.0 - 46.0 % Final   ??? MCV 07/03/2017 94.0  80.0 - 100.0 fL Final   ??? MCH 07/03/2017 32.2  26.0 - 34.0 pg Final   ??? MCHC 07/03/2017 34.2  31.0 - 37.0 g/dL Final   ??? RDW 14/78/2956 19.6* 12.0 - 15.0 % Final   ??? MPV 07/03/2017 9.2  7.0 - 10.0 fL Final   ??? Platelet 07/03/2017 47* 150 - 440 10*9/L Final   ??? nRBC 07/03/2017 2  <=4 /100 WBCs Final   ??? Variable HGB Concentration 07/03/2017 Moderate* Not Present Final   ??? Macrocytosis 07/03/2017 Moderate* Not Present Final   ??? Anisocytosis 07/03/2017 Moderate* Not Present Final   ??? Hypochromasia 07/03/2017 Slight* Not Present Final   ??? Blasts % 07/03/2017 36* <=0 % Final   ??? Absolute Neutrophils 07/03/2017 1.1* 2.0 - 7.5 10*9/L Final   ??? Absolute Lymphocytes 07/03/2017 1.6  1.5 - 5.0 10*9/L Final   ??? Absolute Monocytes 07/03/2017 0.1* 0.2 - 0.8 10*9/L Final   ??? Absolute Eosinophils 07/03/2017 0.0  0.0 - 0.4 10*9/L Final   ??? Absolute Basophils 07/03/2017 0.0  0.0 - 0.1 10*9/L Final   ??? Smear Review Comments 07/03/2017 See Comment* Undefined Final    Blasts Present. Myelocytes present. Agranular neutrophils present.    ??? Polychromasia 07/03/2017 Slight* Not Present Final     Assessment:  [name] is a 76 y.o. female with AML progressed from previous Myelodysplastic Syndrome. She received 12 cycles of azacitidine prior to progression to AML. She had hematologic improvement, most notably early in her course of azacitidine, but has progressed clearly over the last 3-4 months, requiring more transfusions and with 38% blasts in the bone marrow.      At [name] last visit, Dr. Malen Gauze discussed with Jessica Wilson the gravity of her prognosis. She decided that she would like to try treatment with enasidenib (Idhifa) given her IDH2 mutation. She has now been on this medication for 6 days and generally appears to be tolerating it well. She does c/o new onset whole left leg pain, which is likely MSK, but could be a manifestation of her leukemia. Her bilirubin has increased but is still WNL. Her uric acid is at the high end of normal, so I will start her on allopurinol.    Otherwise, Jessica Wilson does not show signs of differentiation syndrome (fevers, dyspnea, hypoxia, edema, LAD, etc) or tumor lysis. She will have another lab appointment locally on Monday and see [place] back in clinic next week.    Plan and Recommendations:  AML, secondary after MDS  - RTC in one week to see Dr. Malen Gauze  - Local labs next Monday, 07/07/17, with transfusions PRN - Dr. Donneta Romberg    Iron balance: She was not iron overloaded prior to starting therapy and has not been heavily transfused.  Would follow  iron parameters only if she develops significant transfusion dependence.    Infection prophylaxis:  If ANC <0.5 between cycles, would add levofloxacin 500 mg per day and fluconazole 400 mg per day when neutropenic but does not need those currently; acyclovir certainly reasonable.  --influenza vaccine given at last visit.  --had PSV23 07/2016    TLS prophylaxis:  - Start allopurinol 300 mg by mouth daily    Left leg pain: do not want to use NSAIDs in setting of potential for TLS on Idhifa  - Low dose tramadol - 25 mg one to two times daily pain - x 5 days.    Hypertension: patient taken off of prior BP meds while on azacitidine  - Recommended that patient keep a blood pressure log this week and bring for review next week    Urinary frequency:   - Urinalysis negative for UTI  - Recommended that patient be evaluated should she develop worsening symptoms and/or fever.    Dr. Malen Gauze was available.    Mariana Kaufman, AGPCNP-BC  Nurse Practitioner  Hematology/Oncology  [facility]  07/03/2017

## 2017-07-07 ENCOUNTER — Inpatient Hospital Stay: Payer: Medicare Other

## 2017-07-07 DIAGNOSIS — D4622 Refractory anemia with excess of blasts 2: Secondary | ICD-10-CM | POA: Diagnosis not present

## 2017-07-07 DIAGNOSIS — N183 Chronic kidney disease, stage 3 (moderate): Secondary | ICD-10-CM | POA: Diagnosis not present

## 2017-07-07 DIAGNOSIS — I129 Hypertensive chronic kidney disease with stage 1 through stage 4 chronic kidney disease, or unspecified chronic kidney disease: Secondary | ICD-10-CM | POA: Diagnosis not present

## 2017-07-07 DIAGNOSIS — E785 Hyperlipidemia, unspecified: Secondary | ICD-10-CM | POA: Diagnosis not present

## 2017-07-07 DIAGNOSIS — C9202 Acute myeloblastic leukemia, in relapse: Secondary | ICD-10-CM | POA: Diagnosis not present

## 2017-07-07 DIAGNOSIS — D46Z Other myelodysplastic syndromes: Secondary | ICD-10-CM

## 2017-07-07 DIAGNOSIS — Z87891 Personal history of nicotine dependence: Secondary | ICD-10-CM | POA: Diagnosis not present

## 2017-07-07 DIAGNOSIS — E1122 Type 2 diabetes mellitus with diabetic chronic kidney disease: Secondary | ICD-10-CM | POA: Diagnosis not present

## 2017-07-07 DIAGNOSIS — Z79899 Other long term (current) drug therapy: Secondary | ICD-10-CM | POA: Diagnosis not present

## 2017-07-07 DIAGNOSIS — Z881 Allergy status to other antibiotic agents status: Secondary | ICD-10-CM | POA: Diagnosis not present

## 2017-07-07 DIAGNOSIS — K219 Gastro-esophageal reflux disease without esophagitis: Secondary | ICD-10-CM | POA: Diagnosis not present

## 2017-07-07 DIAGNOSIS — Z7984 Long term (current) use of oral hypoglycemic drugs: Secondary | ICD-10-CM | POA: Diagnosis not present

## 2017-07-07 LAB — CBC WITH DIFFERENTIAL/PLATELET
BAND NEUTROPHILS: 3 %
BASOS ABS: 0 10*3/uL (ref 0–0.1)
BASOS PCT: 0 %
BLASTS: 28 %
EOS PCT: 0 %
Eosinophils Absolute: 0 10*3/uL (ref 0–0.7)
HCT: 23.6 % — ABNORMAL LOW (ref 35.0–47.0)
HEMOGLOBIN: 8 g/dL — AB (ref 12.0–16.0)
Lymphocytes Relative: 46 %
Lymphs Abs: 2.3 10*3/uL (ref 1.0–3.6)
MCH: 32.1 pg (ref 26.0–34.0)
MCHC: 34.1 g/dL (ref 32.0–36.0)
MCV: 94.2 fL (ref 80.0–100.0)
METAMYELOCYTES PCT: 1 %
MONOS PCT: 4 %
Monocytes Absolute: 0.2 10*3/uL (ref 0.2–0.9)
Neutro Abs: 1.1 10*3/uL — ABNORMAL LOW (ref 1.4–6.5)
Neutrophils Relative %: 18 %
PLATELETS: 43 10*3/uL — AB (ref 150–440)
RBC: 2.5 MIL/uL — ABNORMAL LOW (ref 3.80–5.20)
RDW: 19.2 % — ABNORMAL HIGH (ref 11.5–14.5)
WBC: 5.1 10*3/uL (ref 3.6–11.0)
nRBC: 3 /100 WBC — ABNORMAL HIGH

## 2017-07-07 LAB — SAMPLE TO BLOOD BANK

## 2017-07-09 ENCOUNTER — Ambulatory Visit
Admission: RE | Admit: 2017-07-09 | Discharge: 2017-07-09 | Disposition: A | Discharge status: Home / Self Care | Payer: MEDICARE | Attending: Oncology | Admitting: Oncology

## 2017-07-09 ENCOUNTER — Ambulatory Visit
Admission: RE | Admit: 2017-07-09 | Discharge: 2017-07-09 | Disposition: A | Discharge status: Home / Self Care | Payer: MEDICARE

## 2017-07-09 ENCOUNTER — Ambulatory Visit
Admission: RE | Admit: 2017-07-09 | Discharge: 2017-07-09 | Disposition: A | Discharge status: Home / Self Care | Payer: MEDICARE | Attending: Internal Medicine | Admitting: Internal Medicine

## 2017-07-09 DIAGNOSIS — D46Z Other myelodysplastic syndromes: Principal | ICD-10-CM

## 2017-07-09 DIAGNOSIS — C9200 Acute myeloblastic leukemia, not having achieved remission: Principal | ICD-10-CM

## 2017-07-09 DIAGNOSIS — I10 Essential (primary) hypertension: Secondary | ICD-10-CM

## 2017-07-09 DIAGNOSIS — Z9221 Personal history of antineoplastic chemotherapy: Secondary | ICD-10-CM | POA: Diagnosis not present

## 2017-07-09 DIAGNOSIS — M79605 Pain in left leg: Secondary | ICD-10-CM | POA: Diagnosis not present

## 2017-07-09 DIAGNOSIS — Z79899 Other long term (current) drug therapy: Secondary | ICD-10-CM | POA: Diagnosis not present

## 2017-07-09 DIAGNOSIS — R0989 Other specified symptoms and signs involving the circulatory and respiratory systems: Secondary | ICD-10-CM | POA: Diagnosis not present

## 2017-07-09 DIAGNOSIS — J029 Acute pharyngitis, unspecified: Secondary | ICD-10-CM | POA: Diagnosis not present

## 2017-07-09 DIAGNOSIS — N179 Acute kidney failure, unspecified: Secondary | ICD-10-CM | POA: Diagnosis not present

## 2017-07-09 LAB — COMPREHENSIVE METABOLIC PANEL
ALBUMIN: 4 g/dL (ref 3.5–5.0)
ALKALINE PHOSPHATASE: 88 U/L (ref 38–126)
ALT (SGPT): 30 U/L (ref 15–48)
ANION GAP: 15 mmol/L (ref 9–15)
AST (SGOT): 18 U/L (ref 14–38)
BILIRUBIN TOTAL: 1 mg/dL (ref 0.0–1.2)
BLOOD UREA NITROGEN: 16 mg/dL (ref 7–21)
BUN / CREAT RATIO: 12
CHLORIDE: 99 mmol/L (ref 98–107)
CO2: 27 mmol/L (ref 22.0–30.0)
EGFR MDRD AF AMER: 45 mL/min/{1.73_m2} — ABNORMAL LOW (ref >=60)
EGFR MDRD NON AF AMER: 37 mL/min/{1.73_m2} — ABNORMAL LOW (ref >=60)
GLUCOSE RANDOM: 121 mg/dL (ref 65–179)
POTASSIUM: 4.3 mmol/L (ref 3.5–5.0)
PROTEIN TOTAL: 7.8 g/dL (ref 6.5–8.3)
SODIUM: 141 mmol/L (ref 135–145)

## 2017-07-09 LAB — CBC W/ AUTO DIFF
HEMATOCRIT: 24.6 % — ABNORMAL LOW (ref 36.0–46.0)
HEMOGLOBIN: 8.2 g/dL — ABNORMAL LOW (ref 12.0–16.0)
MEAN CORPUSCULAR HEMOGLOBIN CONC: 33.1 g/dL (ref 31.0–37.0)
MEAN CORPUSCULAR HEMOGLOBIN: 31.9 pg (ref 26.0–34.0)
MEAN PLATELET VOLUME: 9.8 fL (ref 7.0–10.0)
NUCLEATED RED BLOOD CELLS: 1 /100{WBCs} (ref <=4)
RED BLOOD CELL COUNT: 2.55 10*12/L — ABNORMAL LOW (ref 4.00–5.20)
WBC ADJUSTED: 5.2 10*9/L (ref 4.5–11.0)

## 2017-07-09 LAB — MANUAL DIFFERENTIAL
BASOPHILS - ABS (DIFF): 0 10*9/L (ref 0.0–0.1)
EOSINOPHILS - ABS (DIFF): 0 10*9/L (ref 0.0–0.4)
MONOCYTES - ABS (DIFF): 0.6 10*9/L (ref 0.2–0.8)
NEUTROPHILS - ABS (DIFF): 1.2 10*9/L — ABNORMAL LOW (ref 2.0–7.5)

## 2017-07-09 LAB — URIC ACID
URIC ACID: 5.6 mg/dL (ref 3.0–6.5)
Urate:MCnc:Pt:Ser/Plas:Qn:: 5.6

## 2017-07-09 LAB — ANION GAP: Anion gap 3:SCnc:Pt:Ser/Plas:Qn:: 15

## 2017-07-09 LAB — HEMATOCRIT: Lab: 24.6 — ABNORMAL LOW

## 2017-07-09 LAB — MONOCYTES - ABS (DIFF): Lab: 0.6

## 2017-07-09 MED ORDER — AMLODIPINE 5 MG TABLET
ORAL_TABLET | Freq: Every day | ORAL | 11 refills | 0.00000 days | Status: CP
Start: 2017-07-09 — End: 2017-09-18

## 2017-07-09 NOTE — Unmapped (Signed)
-----   Message from Jessica Wilson sent at 07/09/2017 11:21 AM EDT -----  Regarding: [id number] Notes needed for Walker  Contact: [phone]  Jessica Wilson, sister of patient, Jessica Wilson, is requesting to speak to the navigator directly regarding the need for medical notes to be faxed to Dimas Aguas, with [employer], in order to obtain the walker prescribed by Dr. Arnold Long. The best number to reach Dimas Aguas is [phone]. Dimas Aguas says [employer] needs today's notes and the notes need to reflect the reason for the walker. The best fax number for [employer] is [phone]. The best number to reach [place] [place] is [phone].     Thank you,  Jessica Wilson  [facility]  [phone]

## 2017-07-09 NOTE — Unmapped (Signed)
Lab drawn and sent for analysis, care provided by [facility] RN

## 2017-07-09 NOTE — Unmapped (Signed)
A[facility] Triage Note     Patient: Jessica Wilson     Reason for call:  patient notes needed for rollator    Time call returned: [phone]   Phone Assessment: Spoke with [employer]  To prescribe the Rollator they need the notes to indicate. The person came to office and met with the MD.  The rollator walker was discussed and the reason why it's being prescribed must be noted. The term Rollator walker should be used.     Triage Recommendations:will message team     Patient Response:n/a   Outstanding tasks: see message below.  thanks

## 2017-07-09 NOTE — Unmapped (Signed)
[facility] Leukemia Clinic Follow-up    Patient Name: Jessica Wilson  Patient Age: 76 y.o.  Encounter Date: 07/09/2017    Primary Care Provider:  Tyson Dense, MD    Referring Physician:  Nigel Bridgeman, MD  [address]  [address]  [place], [place] [address]    Reason for visit:  Follow up of MDS    History of Present Illness:  We had the pleasure of seeing Jessica Wilson in the Leukemia Clinic at the [place] [facility] [place] on 07/09/2017.  She is a 76 y.o. 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      She was on azacitidine until 04/2017 (received about 12 cycles total) now held given progression to AML.    Her oncologic history is as follows:      Oncology History    Referring/Local Oncologist: Dr. Louretta Shorten, [facility]    Diagnosis: MDS with Excess Blasts-2    Genetics:   Karyotype/FISH: 46XX     Molecular Genetics: not performed    Pertinent Phenotypic data: no aberrant immunophenotype by flow cytometry, however blasts stained by IHC for CD117, MPO.  Only 5% marrow cells stained for CD34.    Disease-specific prognostic estimation: IPSS-R high risk, median OS 1.6 years, with 25% AML risk at 1.4 years            MDS (myelodysplastic syndrome), high grade (CMS-HCC)    04/17/2016 Initial Diagnosis     MDS (myelodysplastic syndrome), high grade (RAF-HCC)       04/29/2016 -  Chemotherapy     Azacitidine cycle 1: 75 mg/m2 [place] days 1-7 of 28-day cycles         05/27/2016 -  Chemotherapy     Azacitidine cycle 2: 75 mg/m2 [place] days 1-7 of 28 day cycles         06/24/2016 -  Chemotherapy     Azacitidine cycle 3: 75 mg/m2 [place] days 1-7 of 28 day cycle         07/22/2016 -  Chemotherapy     Azacitidine cycle 4: 75 mg/m2 [place] days 1-7 of 28 day cycle         08/19/2016 -  Chemotherapy     Azacitidine cycle 5: 75 mg/m2 [place] x 7 days of 28 day cycle         09/16/2016 -  Chemotherapy     Azacitidine cycle 6: 75 mbg/m2 [place] x 7 days of 28 day cycle         10/14/2016 -  Chemotherapy     Azacitidine cycle 7: 75 mg/m2 [place] x 7 days of 28 day cycle         12/09/2016 -  Chemotherapy     Azacitidine cycle 8: 60 mg/m2 [place] x 7 days of 28 day (cycle reduced by 20% due to hematologic toxicity)         01/13/2017 -  Chemotherapy     Azacitidine cycle 9: 60 mg/m2  x 5 days of 28 day cycle (reduced by 2 days and 20% per dose due to hematologic toxicity)         05/29/2017 Progression     Given increasing transfusion requirements, repeat bone marrow biopsy done, now with 35% blasts and meets criteria for progression to AML.           Acute myeloid leukemia not having achieved remission (CMS-HCC)    06/02/2017 Initial Diagnosis     Acute myeloid  leukemia not having achieved remission         06/28/2017 -  Chemotherapy     Idfhifa 100 mg by mouth daily            Interim History:  Since last seen here, she has had a blood transfusion, last 10/23. No change in shortness of breath. Does complain of sore throat, runny nose, improved with claritin but having some difficulty sleeping. She denies fever, cough, chest pain, chest pressure.    BP at home has been running 170 systolic. Was previously on three BP meds, but were stopped when started on 5-aza a year ago. Meds were bisoprolol 5 mg daily, HCTZ 6.25 mg daily, and something else that was also good for the kidneys.    Says that leg pain is much better, bearing weight and walking around without major problem. Not worse with bearing weight. Still tender over L lateral leg but doing better with tramadol, not progressing.    Appetite is low, not eating much.    Otherwise, she denies new constitutional symptoms such as anorexia, weight loss night sweats or unexplained fevers.  Furthermore, she denies symptoms of marrow failure: unexplained bleeding or bruising, recurrent or unexplained intercurrent infections, dyspnea on exertion, lightheadedness, palpitations or chest pain.  There have been no new or unexplained pains or self-identified masses, swelling or enlarged lymph nodes.    Past Medical, Surgical and Family History were reviewed and pertinent updates were made in the Electronic Medical Record    Review of Systems:  Other than as reported above in the interim history, all other systems were negative.    ECOG Performance Status: 2    Medications:    Current Outpatient Prescriptions   Medication Sig Dispense Refill   ??? acyclovir (ZOVIRAX) 400 MG tablet Take 400 mg by mouth Two (2) times a day.      ??? allopurinol (ZYLOPRIM) 300 MG tablet TAKE 1 TABLET(300 MG) BY MOUTH DAILY 90 tablet 0   ??? buPROPion (WELLBUTRIN) 100 MG tablet Take 300 mg by mouth daily at 0600.      ??? CARTIA XT 180 mg 24 hr capsule      ??? clonazePAM (KLONOPIN) 0.5 MG tablet Take 1 mg by mouth two (2) times a day as needed for anxiety.      ??? enasidenib (IDHIFA) 100 mg tablet Take 1 tablet (100 mg total) by mouth daily. 30 tablet 6   ??? metFORMIN (GLUCOPHAGE) 500 MG tablet Take 500 mg by mouth nightly.      ??? rosuvastatin (CRESTOR) 20 MG tablet Take 20 mg by mouth daily.      ??? sertraline (ZOLOFT) 100 MG tablet Take 150 mg by mouth daily.      ??? traMADol (ULTRAM) 50 mg tablet Take 1/2 tablet (25 mg) one to two times daily as needed for pain. 5 tablet 0   ??? umeclidinium-vilanterol (ANORO ELLIPTA) 62.5-25 mcg/actuation inhaler Inhale 1 puff daily.       No current facility-administered medications for this visit.        Vital Signs:  Vitals:    07/09/17 0807   BP: 145/66   Pulse: 79   Resp: 16   Temp: 37.5 ??C (99.5 ??F)   SpO2: 93%       Physical Exam:  Constitutional: NAD, breathing comfortably  Neuro: alert, grossly oriented  HEENT: mmm, no oral lesions; L eye teary with post-surgical iris change; R eye closed, dry  CV: regular rate, no murmurs appreciated  Pulm: clear, no w/r/r  Lymph: no cervical, supraclavicular, axillary, or inguinal LAD  GI: obese, soft, non-distended, non-tender, no HSM  MSK: wide-based gait, but able to move independently; tender to palpation over lateral proximal fever  Integ: no rash noted; bone marrow biopsy site clean/dry and without bruising though with subcutaneous fluid, +tender  Psych: affect appropriate      Relevant Laboratory, radiology and pathology results:  I personally viewed the most recent external records, laboratory results or peripheral blood smear and discussed the available results with the patient or family.  A summary of results follows:  Lab on 07/09/2017   Component Date Value Ref Range Status   ??? WBC 07/09/2017   4.5 - 11.0 10*9/L Preliminary    Pending.   ??? RBC 07/09/2017 2.55* 4.00 - 5.20 10*12/L Preliminary   ??? HGB 07/09/2017 8.2* 12.0 - 16.0 g/dL Preliminary   ??? HCT 07/09/2017 24.6* 36.0 - 46.0 % Preliminary   ??? MCV 07/09/2017 96.4  80.0 - 100.0 fL Preliminary   ??? MCH 07/09/2017 31.9  26.0 - 34.0 pg Preliminary   ??? MCHC 07/09/2017 33.1  31.0 - 37.0 g/dL Preliminary   ??? RDW 07/09/2017 20.1* 12.0 - 15.0 % Preliminary   ??? MPV 07/09/2017 9.8  7.0 - 10.0 fL Preliminary   ??? Platelet 07/09/2017 37* 150 - 440 10*9/L Preliminary   ??? Variable HGB Concentration 07/09/2017 Moderate* Not Present Preliminary   ??? Macrocytosis 07/09/2017 Moderate* Not Present Preliminary   ??? Anisocytosis 07/09/2017 Moderate* Not Present Preliminary   ??? Hypochromasia 07/09/2017 Moderate* Not Present Preliminary   Office Visit on 07/03/2017   Component Date Value Ref Range Status   ??? Color, UA 07/03/2017 Yellow   Final   ??? Clarity, UA 07/03/2017 Clear   Final   ??? Specific Gravity, UA 07/03/2017 1.007  1.003 - 1.030 Final   ??? pH, UA 07/03/2017 7.0  5.0 - 9.0 Final   ??? Leukocyte Esterase, UA 07/03/2017 Negative  Negative Final   ??? Nitrite, UA 07/03/2017 Negative  Negative Final   ??? Protein, UA 07/03/2017 30 mg/dL* Negative Final   ??? Glucose, UA 07/03/2017 Negative  Negative Final   ??? Ketones, UA 07/03/2017 Negative  Negative Final   ??? Urobilinogen, UA 07/03/2017 0.2 mg/dL  0.2 mg/dL, 1.0 mg/dL Final   ??? Bilirubin, UA 07/03/2017 Negative  Negative Final   ??? Blood, UA 07/03/2017 Moderate* Negative Final   ??? RBC, UA 07/03/2017 8* <4 /HPF Final   ??? WBC, UA 07/03/2017 1  0 - 5 /HPF Final   ??? Squam Epithel, UA 07/03/2017 <1  0 - 5 /HPF Final   ??? Bacteria, UA 07/03/2017 Rare* None Seen /HPF Final   ??? Mucus, UA 07/03/2017 Rare* None Seen /HPF Final   ??? Urine Culture, Comprehensive 07/03/2017 Mixed Urogenital Flora   Final   Lab on 07/03/2017   Component Date Value Ref Range Status   ??? LDH 07/03/2017 661* 338 - 610 U/L Final   ??? Uric Acid 07/03/2017 6.3  3.0 - 6.5 mg/dL Final   ??? Phosphorus 07/03/2017 3.2  2.9 - 4.7 mg/dL Final   ??? Sodium 78/29/5621 144  135 - 145 mmol/L Final   ??? Potassium 07/03/2017 4.1  3.5 - 5.0 mmol/L Final   ??? Chloride 07/03/2017 102  98 - 107 mmol/L Final   ??? CO2 07/03/2017 31.0* 22.0 - 30.0 mmol/L Final   ??? BUN 07/03/2017 15  7 - 21 mg/dL Final   ??? Creatinine 07/03/2017 1.13* 0.60 -  1.00 mg/dL Final   ??? BUN/Creatinine Ratio 07/03/2017 13   Final   ??? EGFR MDRD Non Af Amer 07/03/2017 47* >=60 mL/min/1.11m2 Final   ??? EGFR MDRD Af Amer 07/03/2017 57* >=60 mL/min/1.48m2 Final   ??? Anion Gap 07/03/2017 11  9 - 15 mmol/L Final   ??? Glucose 07/03/2017 123  65 - 179 mg/dL Final   ??? Calcium 36/64/4034 9.0  8.5 - 10.2 mg/dL Final   ??? Albumin 74/25/9563 3.9  3.5 - 5.0 g/dL Final   ??? Total Protein 07/03/2017 7.4  6.5 - 8.3 g/dL Final   ??? Total Bilirubin 07/03/2017 1.0  0.0 - 1.2 mg/dL Final   ??? AST 87/56/4332 22  14 - 38 U/L Final   ??? ALT 07/03/2017 30  15 - 48 U/L Final   ??? Alkaline Phosphatase 07/03/2017 94  38 - 126 U/L Final   ??? WBC 07/03/2017 4.4* 4.5 - 11.0 10*9/L Final   ??? RBC 07/03/2017 2.70* 4.00 - 5.20 10*12/L Final   ??? HGB 07/03/2017 8.7* 12.0 - 16.0 g/dL Final   ??? HCT 95/18/8416 25.4* 36.0 - 46.0 % Final   ??? MCV 07/03/2017 94.0  80.0 - 100.0 fL Final   ??? MCH 07/03/2017 32.2  26.0 - 34.0 pg Final   ??? MCHC 07/03/2017 34.2  31.0 - 37.0 g/dL Final   ??? RDW 60/63/0160 19.6* 12.0 - 15.0 % Final   ??? MPV 07/03/2017 9.2  7.0 - 10.0 fL Final   ??? Platelet 07/03/2017 47* 150 - 440 10*9/L Final   ??? nRBC 07/03/2017 2  <=4 /100 WBCs Final   ??? Variable HGB Concentration 07/03/2017 Moderate* Not Present Final   ??? Macrocytosis 07/03/2017 Moderate* Not Present Final   ??? Anisocytosis 07/03/2017 Moderate* Not Present Final   ??? Hypochromasia 07/03/2017 Slight* Not Present Final   ??? Blasts % 07/03/2017 36* <=0 % Final   ??? Absolute Neutrophils 07/03/2017 1.1* 2.0 - 7.5 10*9/L Final   ??? Absolute Lymphocytes 07/03/2017 1.6  1.5 - 5.0 10*9/L Final   ??? Absolute Monocytes 07/03/2017 0.1* 0.2 - 0.8 10*9/L Final   ??? Absolute Eosinophils 07/03/2017 0.0  0.0 - 0.4 10*9/L Final   ??? Absolute Basophils 07/03/2017 0.0  0.0 - 0.1 10*9/L Final   ??? Smear Review Comments 07/03/2017 See Comment* Undefined Final    Blasts Present. Myelocytes present. Agranular neutrophils present.    ??? Polychromasia 07/03/2017 Slight* Not Present Final       ECHO 04/2017  INTERPRETATION  NORMAL LEFT VENTRICULAR SYSTOLIC FUNCTION  NORMAL RIGHT VENTRICULAR SYSTOLIC FUNCTION  MILD VALVULAR REGURGITATION (See above)  NO VALVULAR STENOSIS  EF 55-60%    Bone marrow biopsy, full results not found in care everywhere  ACUTE MYELOID LEUKEMIA [38% blasts]; and also the peripheral blood with blasts.        Assessment:  Patient Active Problem List    Diagnosis Date Noted   ??? Paroxysmal atrial fibrillation (CMS-HCC) 06/11/2017   ??? Panic attack 06/11/2017   ??? Acute myeloid leukemia not having achieved remission (CMS-HCC) 06/02/2017   ??? Thrombocytopenia (CMS-HCC) 10/21/2016   ??? MDS (myelodysplastic syndrome), high grade (CMS-HCC) 04/17/2016   ??? Anemia 02/21/2016   ??? Proteinuria 05/10/2015   ??? Dyslipidemia 05/05/2015   ??? Essential (primary) hypertension 05/05/2015   ??? Arteriosclerosis of coronary artery 02/02/2015   ??? Diabetes mellitus, type 2 (CMS-HCC) 02/02/2015   ??? COPD, mild (CMS-HCC) 05/02/2014       A 76 y.o. year old female with AML progressed  from previous Myelodysplastic Syndrome now s/p 12 cycles of azacitidine prior to progression to AML. She had hematologic improvement, most notably early in her course of azacitidine, but has progressed clearly over the last 3-4 months, requiring more transfusions and with 38% blasts in the bone marrow.  We discussed the gravity of her prognosis.     Plan and Recommendations:  AML, secondary after MDS  -progressed to AML on 5-AZA 05/2017 after one year of therapy  -NGS sent 06/2017   -FLT3-ITD <0.05 mutational burden   -IDH2   -DNMT3A  --treatment options include FLT3 inhibition and IDH2 inhibition; given low level FLT3-ITD allelic ratio (admittedly in peripheral blood, not bone marrow) and also considering side effect profile, we recommended enasidenib  --continue transfusion with Dr. Donneta Romberg  -currently day of 10 of enasidenib, tolerating well without signs of differentiation syndrome   - tolerating well but platelets have been trending down (even before starting this transfusion) and blasts are still trending up  - follow up 11/12    AKI: etiology unknown at this point, ?TLS though does not have hyperkalemia; PO intake has been poor though has not changed recently so does not really explain AKI  -monitor closely for now    Infection prophylaxis:  If ANC <0.5 between cycles, would add levofloxacin 500 mg per day and fluconazole 400 mg per day when neutropenic but does not need those currently; acyclovir certainly reasonable.  --influenza vaccine today  --had PSV23 07/2016    TLS prophylaxis:  - Cont allopurinol 300 mg by mouth daily  ??  Left leg pain: likely bursitis given tenderness over left proximal leg; fracture or something more serious less likely as she is able to bear weight without pain and ambulate independently; do not want to use NSAIDs in setting of potential for TLS on Idhifa  - Low dose tramadol - 25 mg one to two times daily pain  ??  Hypertension: patient taken off of prior BP meds while on azacitidine  - SBP consistently over 170 at home  -she had previously been on bisoprolol, hctz and something else good for the kidneys but is still on diltiazem (for afib)  - to avoid drug interactions and avoid affecting the kidneys, will start amlodipine 5 mg daily    Immobility: immobile 2/2 both musculoskeletal pain related to chemotherapy treatment as well as breathlessness and fatigue from both anemia and underlying leukemia in general. Needs rollator walker to be able to take breaks when she can not catch her breath.    See and discussed with Dr. Malen Gauze, attestation to follow.    Lunette Stands, MD  Hematology/Oncology Fellow  PGY-6  07/09/2017 8:19 AM

## 2017-07-09 NOTE — Unmapped (Signed)
Jessica Wilson is a 76 y.o. female with AML who I am seeing in clinic today for oral chemotherapy monitoring    Encounter Date: 07/09/2017    Current Treatment: enasidenib 100 mg daily    Treatment History: Enasidenib started 06/28/17. See oncologic history for full treatment history    Review of Symptoms:    No fever, chills, or LEE suggesting differentiation syndrome  She endorses mild dyspnea on exertion which occurred before starting enasidenib and is unchanged.     In regards to today's labwork, her serum creatinine has slightly increased from 1.13 mg/dL to 4.54 mg/dL. Potassium and uric acid WNL.     Oncologic History:  Oncology History    Referring/Local Oncologist: Dr. Louretta Shorten, [facility]    Diagnosis: MDS with Excess Blasts-2    Genetics:   Karyotype/FISH: 46XX     Molecular Genetics: not performed    Pertinent Phenotypic data: no aberrant immunophenotype by flow cytometry, however blasts stained by IHC for CD117, MPO.  Only 5% marrow cells stained for CD34.    Disease-specific prognostic estimation: IPSS-R high risk, median OS 1.6 years, with 25% AML risk at 1.4 years            MDS (myelodysplastic syndrome), high grade (CMS-HCC)    04/17/2016 Initial Diagnosis     MDS (myelodysplastic syndrome), high grade (RAF-HCC)       04/29/2016 -  Chemotherapy     Azacitidine cycle 1: 75 mg/m2 [place] days 1-7 of 28-day cycles         05/27/2016 -  Chemotherapy     Azacitidine cycle 2: 75 mg/m2 [place] days 1-7 of 28 day cycles         06/24/2016 -  Chemotherapy     Azacitidine cycle 3: 75 mg/m2 [place] days 1-7 of 28 day cycle         07/22/2016 -  Chemotherapy     Azacitidine cycle 4: 75 mg/m2 [place] days 1-7 of 28 day cycle         08/19/2016 -  Chemotherapy     Azacitidine cycle 5: 75 mg/m2 [place] x 7 days of 28 day cycle         09/16/2016 -  Chemotherapy     Azacitidine cycle 6: 75 mbg/m2 [place] x 7 days of 28 day cycle         10/14/2016 -  Chemotherapy     Azacitidine cycle 7: 75 mg/m2 [place] x 7 days of 28 day cycle 12/09/2016 -  Chemotherapy     Azacitidine cycle 8: 60 mg/m2 [place] x 7 days of 28 day (cycle reduced by 20% due to hematologic toxicity)         01/13/2017 -  Chemotherapy     Azacitidine cycle 9: 60 mg/m2 [place] x 5 days of 28 day cycle (reduced by 2 days and 20% per dose due to hematologic toxicity)         05/29/2017 Progression     Given increasing transfusion requirements, repeat bone marrow biopsy done, now with 35% blasts and meets criteria for progression to AML.           Acute myeloid leukemia not having achieved remission (CMS-HCC)    06/02/2017 Initial Diagnosis     Acute myeloid leukemia not having achieved remission         06/28/2017 -  Chemotherapy     Idfhifa 100 mg by mouth daily            Weight  and Vitals:  Wt Readings from Last 3 Encounters:   07/09/17 (!) 106.1 kg (234 lb)   07/03/17 (!) 107.3 kg (236 lb 9.6 oz)   06/25/17 (!) 109 kg (240 lb 6.4 oz)     Temp Readings from Last 3 Encounters:   07/09/17 37.5 ??C (99.5 ??F) (Oral)   07/03/17 37.3 ??C (99.2 ??F) (Oral)   06/25/17 37 ??C (98.6 ??F) (Oral)     BP Readings from Last 3 Encounters:   07/09/17 145/66   07/03/17 153/65   06/25/17 170/72     Pulse Readings from Last 3 Encounters:   07/09/17 79   07/03/17 78   06/25/17 70       Pertinent Labs:  Lab on 07/09/2017   Component Date Value Ref Range Status   ??? Sodium 07/09/2017 141  135 - 145 mmol/L Final   ??? Potassium 07/09/2017 4.3  3.5 - 5.0 mmol/L Final   ??? Chloride 07/09/2017 99  98 - 107 mmol/L Final   ??? CO2 07/09/2017 27.0  22.0 - 30.0 mmol/L Final   ??? BUN 07/09/2017 16  7 - 21 mg/dL Final   ??? Creatinine 07/09/2017 1.39* 0.60 - 1.00 mg/dL Final   ??? BUN/Creatinine Ratio 07/09/2017 12   Final   ??? EGFR MDRD Non Af Amer 07/09/2017 37* >=60 mL/min/1.22m2 Final   ??? EGFR MDRD Af Amer 07/09/2017 45* >=60 mL/min/1.34m2 Final   ??? Anion Gap 07/09/2017 15  9 - 15 mmol/L Final   ??? Glucose 07/09/2017 121  65 - 179 mg/dL Final   ??? Calcium 16/06/9603 9.4  8.5 - 10.2 mg/dL Final   ??? Albumin 54/05/8118 4.0  3.5 - 5.0 g/dL Final   ??? Total Protein 07/09/2017 7.8  6.5 - 8.3 g/dL Final   ??? Total Bilirubin 07/09/2017 1.0  0.0 - 1.2 mg/dL Final   ??? AST 14/78/2956 18  14 - 38 U/L Final   ??? ALT 07/09/2017 30  15 - 48 U/L Final   ??? Alkaline Phosphatase 07/09/2017 88  38 - 126 U/L Final   ??? WBC 07/09/2017 5.2  4.5 - 11.0 10*9/L Final   ??? RBC 07/09/2017 2.55* 4.00 - 5.20 10*12/L Final   ??? HGB 07/09/2017 8.2* 12.0 - 16.0 g/dL Final   ??? HCT 21/30/8657 24.6* 36.0 - 46.0 % Final   ??? MCV 07/09/2017 96.4  80.0 - 100.0 fL Final   ??? MCH 07/09/2017 31.9  26.0 - 34.0 pg Final   ??? MCHC 07/09/2017 33.1  31.0 - 37.0 g/dL Final   ??? RDW 84/69/6295 20.1* 12.0 - 15.0 % Final   ??? MPV 07/09/2017 9.8  7.0 - 10.0 fL Final   ??? Platelet 07/09/2017 37* 150 - 440 10*9/L Final   ??? nRBC 07/09/2017 1  <=4 /100 WBCs Final   ??? Variable HGB Concentration 07/09/2017 Moderate* Not Present Final   ??? Macrocytosis 07/09/2017 Moderate* Not Present Final   ??? Anisocytosis 07/09/2017 Moderate* Not Present Final   ??? Hypochromasia 07/09/2017 Moderate* Not Present Final   ??? Blasts % 07/09/2017 39* <=0 % Final   ??? Absolute Neutrophils 07/09/2017 1.2* 2.0 - 7.5 10*9/L Final   ??? Absolute Lymphocytes 07/09/2017 1.4* 1.5 - 5.0 10*9/L Final   ??? Absolute Monocytes 07/09/2017 0.6  0.2 - 0.8 10*9/L Final   ??? Absolute Eosinophils 07/09/2017 0.0  0.0 - 0.4 10*9/L Final   ??? Absolute Basophils 07/09/2017 0.0  0.0 - 0.1 10*9/L Final   ??? Smear Review Comments 07/09/2017 See Comment* Undefined  Final    Blasts Present. Myelocytes present. Promyelocytes present.    ??? Polychromasia 07/09/2017 Slight* Not Present Final       Allergies: No Known Allergies    Drug Interactions: Jessica Wilson was previously prescribed diltiazem XT 180 mg daily for atrial fibrillation and was initiated on amlodipine 5 mg daily for blood pressure control by Dr. Arnold Long. The combination of two calcium channel blockers, although in different classes, is not ideal so will monitor closely for BP, HR, and side effects.    Current Medications:  Current Outpatient Prescriptions   Medication Sig Dispense Refill   ??? acetaminophen (TYLENOL) 325 MG tablet Take 650 mg by mouth every six (6) hours as needed for pain.     ??? acyclovir (ZOVIRAX) 400 MG tablet Take 400 mg by mouth Two (2) times a day.      ??? allopurinol (ZYLOPRIM) 300 MG tablet TAKE 1 TABLET(300 MG) BY MOUTH DAILY 90 tablet 0   ??? buPROPion (WELLBUTRIN) 100 MG tablet Take 300 mg by mouth daily at 0600.      ??? CARTIA XT 180 mg 24 hr capsule      ??? clonazePAM (KLONOPIN) 0.5 MG tablet Take 1 mg by mouth two (2) times a day as needed for anxiety.      ??? enasidenib (IDHIFA) 100 mg tablet Take 1 tablet (100 mg total) by mouth daily. 30 tablet 6   ??? metFORMIN (GLUCOPHAGE) 500 MG tablet Take 500 mg by mouth nightly.      ??? rosuvastatin (CRESTOR) 20 MG tablet Take 20 mg by mouth daily.      ??? sertraline (ZOLOFT) 100 MG tablet Take 150 mg by mouth daily.      ??? traMADol (ULTRAM) 50 mg tablet Take 1/2 tablet (25 mg) one to two times daily as needed for pain. 5 tablet 0   ??? umeclidinium-vilanterol (ANORO ELLIPTA) 62.5-25 mcg/actuation inhaler Inhale 1 puff daily.     ??? amLODIPine (NORVASC) 5 MG tablet Take 1 tablet (5 mg total) by mouth daily. (Patient not taking: Reported on 07/09/2017) 30 tablet 11     No current facility-administered medications for this visit.        Adherence:   07/09/17: no missed doses    Education Points:    - I reinforced the importance of adherence to enasidenib.  - Discussed importance of staying hydrated with water to prevent poor kidney function or TLS associated with enasidenib    Pharmacy: [facility]     Confirmed Address and Phone Number: Yes     Medication Access:   [facility] Special[facility] Med Referral: PA Approved  This referral has been APPROVED with the details below:  ??  Medication (Brand/Generic): IDHIFA  Insurance/Contact: [employer]  Authorization ID/Case#: [id number]  Coverage Dates: Through 09/08/18  ??  Final test claim:  Dispensing Specialty Pharmacy: [facility]  Anticipated Co-Pay: $8.35  ______________________________________________________________________  ??  This referral has been handed off to the [facility] for further processing and filling of the prescribed medication.   ______________________________________________________________________  Please utilize this referral for viewing purposes as it will serve as the central location for all relevant documentation and updates.            Assessment: Jessica Wilson is a 76 y.o. female with AML being treated currently with enasidenib.    Plan: per AVS    1. Continue Idhifa 100 mg daily. The medication will be shipped from our specialty pharmacy. They will call you for  refill reminders, or if you have less than 5 days left, call [phone], option 4 for refills.  ??  2. Drink plenty of water at home to help your kidneys. We will continue to check your blood for your kidney function.    F/u: labs in 1 week with PCP, appointment with Dr. Malen Gauze on 07/21/17    Future Appointments  Date Time Provider Department Center   07/21/2017 12:30 PM ADULT ONC LAB [facility]   07/21/2017 1:30 PM Guerry Bruin, MD [id number]       I spent 20 minutes with Jessica Wilson in direct patient care.    Alexis Frock, PharmD  PGY1 Pharmacy Resident  Pager: [phone]      Plan was discussed with Jessica Wilson, PharmD, CPP

## 2017-07-09 NOTE — Unmapped (Signed)
Here's a summary of what we talked about today:    1. Continue Idhifa 100 mg daily. The medication will be shipped from our specialty pharmacy. They will call you for refill reminders, or if you have less than 5 days left, call [phone], option 4 for refills.    2. Drink plenty of water at home to help your kidneys. We will continue to check your blood for your kidney function.

## 2017-07-10 NOTE — Unmapped (Signed)
Faxed provider notes to [employer], regarding rollator walker.

## 2017-07-11 ENCOUNTER — Other Ambulatory Visit: Payer: Self-pay | Admitting: *Deleted

## 2017-07-11 DIAGNOSIS — D46Z Other myelodysplastic syndromes: Secondary | ICD-10-CM

## 2017-07-11 DIAGNOSIS — C9202 Acute myeloblastic leukemia, in relapse: Secondary | ICD-10-CM

## 2017-07-14 ENCOUNTER — Telehealth: Payer: Self-pay | Admitting: Internal Medicine

## 2017-07-14 ENCOUNTER — Other Ambulatory Visit: Payer: Self-pay | Admitting: *Deleted

## 2017-07-14 ENCOUNTER — Inpatient Hospital Stay: Payer: Medicare Other | Attending: Internal Medicine

## 2017-07-14 DIAGNOSIS — Z794 Long term (current) use of insulin: Secondary | ICD-10-CM | POA: Insufficient documentation

## 2017-07-14 DIAGNOSIS — Z9049 Acquired absence of other specified parts of digestive tract: Secondary | ICD-10-CM | POA: Insufficient documentation

## 2017-07-14 DIAGNOSIS — Z881 Allergy status to other antibiotic agents status: Secondary | ICD-10-CM | POA: Insufficient documentation

## 2017-07-14 DIAGNOSIS — E669 Obesity, unspecified: Secondary | ICD-10-CM | POA: Insufficient documentation

## 2017-07-14 DIAGNOSIS — N183 Chronic kidney disease, stage 3 (moderate): Secondary | ICD-10-CM | POA: Diagnosis not present

## 2017-07-14 DIAGNOSIS — Z87891 Personal history of nicotine dependence: Secondary | ICD-10-CM | POA: Diagnosis not present

## 2017-07-14 DIAGNOSIS — E785 Hyperlipidemia, unspecified: Secondary | ICD-10-CM | POA: Diagnosis not present

## 2017-07-14 DIAGNOSIS — K219 Gastro-esophageal reflux disease without esophagitis: Secondary | ICD-10-CM | POA: Insufficient documentation

## 2017-07-14 DIAGNOSIS — C9202 Acute myeloblastic leukemia, in relapse: Secondary | ICD-10-CM

## 2017-07-14 DIAGNOSIS — Z792 Long term (current) use of antibiotics: Secondary | ICD-10-CM | POA: Insufficient documentation

## 2017-07-14 DIAGNOSIS — R740 Nonspecific elevation of levels of transaminase and lactic acid dehydrogenase [LDH]: Secondary | ICD-10-CM | POA: Diagnosis not present

## 2017-07-14 DIAGNOSIS — R231 Pallor: Secondary | ICD-10-CM | POA: Diagnosis not present

## 2017-07-14 DIAGNOSIS — M1990 Unspecified osteoarthritis, unspecified site: Secondary | ICD-10-CM | POA: Diagnosis not present

## 2017-07-14 DIAGNOSIS — R161 Splenomegaly, not elsewhere classified: Secondary | ICD-10-CM | POA: Diagnosis not present

## 2017-07-14 DIAGNOSIS — E1122 Type 2 diabetes mellitus with diabetic chronic kidney disease: Secondary | ICD-10-CM | POA: Diagnosis not present

## 2017-07-14 DIAGNOSIS — I129 Hypertensive chronic kidney disease with stage 1 through stage 4 chronic kidney disease, or unspecified chronic kidney disease: Secondary | ICD-10-CM | POA: Diagnosis not present

## 2017-07-14 DIAGNOSIS — D649 Anemia, unspecified: Secondary | ICD-10-CM

## 2017-07-14 DIAGNOSIS — Z79899 Other long term (current) drug therapy: Secondary | ICD-10-CM | POA: Insufficient documentation

## 2017-07-14 LAB — CBC WITH DIFFERENTIAL/PLATELET
Basophils Absolute: 0 10*3/uL (ref 0–0.1)
Basophils Relative: 0 %
Blasts: 28 %
Eosinophils Absolute: 0 10*3/uL (ref 0–0.7)
Eosinophils Relative: 0 %
HEMATOCRIT: 21.4 % — AB (ref 35.0–47.0)
HEMOGLOBIN: 7.2 g/dL — AB (ref 12.0–16.0)
LYMPHS ABS: 1.9 10*3/uL (ref 1.0–3.6)
LYMPHS PCT: 43 %
MCH: 31.8 pg (ref 26.0–34.0)
MCHC: 33.7 g/dL (ref 32.0–36.0)
MCV: 94.4 fL (ref 80.0–100.0)
MONO ABS: 0.3 10*3/uL (ref 0.2–0.9)
Metamyelocytes Relative: 2 %
Monocytes Relative: 6 %
Myelocytes: 1 %
NEUTROS ABS: 1 10*3/uL — AB (ref 1.4–6.5)
NEUTROS PCT: 20 %
NRBC: 4 /100{WBCs} — AB
Platelets: 13 10*3/uL — CL (ref 150–400)
RBC: 2.27 MIL/uL — AB (ref 3.80–5.20)
RDW: 19.3 % — AB (ref 11.5–14.5)
WBC: 4.5 10*3/uL (ref 3.6–11.0)

## 2017-07-14 LAB — SAMPLE TO BLOOD BANK

## 2017-07-14 LAB — PREPARE RBC (CROSSMATCH)

## 2017-07-14 NOTE — Telephone Encounter (Signed)
Will order platelets/prbc today; check blood work twice weekly.

## 2017-07-15 ENCOUNTER — Inpatient Hospital Stay: Payer: Medicare Other

## 2017-07-15 DIAGNOSIS — E1122 Type 2 diabetes mellitus with diabetic chronic kidney disease: Secondary | ICD-10-CM | POA: Diagnosis not present

## 2017-07-15 DIAGNOSIS — K219 Gastro-esophageal reflux disease without esophagitis: Secondary | ICD-10-CM | POA: Diagnosis not present

## 2017-07-15 DIAGNOSIS — N183 Chronic kidney disease, stage 3 (moderate): Secondary | ICD-10-CM | POA: Diagnosis not present

## 2017-07-15 DIAGNOSIS — Z87891 Personal history of nicotine dependence: Secondary | ICD-10-CM | POA: Diagnosis not present

## 2017-07-15 DIAGNOSIS — Z792 Long term (current) use of antibiotics: Secondary | ICD-10-CM | POA: Diagnosis not present

## 2017-07-15 DIAGNOSIS — E785 Hyperlipidemia, unspecified: Secondary | ICD-10-CM | POA: Diagnosis not present

## 2017-07-15 DIAGNOSIS — C9202 Acute myeloblastic leukemia, in relapse: Secondary | ICD-10-CM | POA: Diagnosis not present

## 2017-07-15 DIAGNOSIS — I129 Hypertensive chronic kidney disease with stage 1 through stage 4 chronic kidney disease, or unspecified chronic kidney disease: Secondary | ICD-10-CM | POA: Diagnosis not present

## 2017-07-15 DIAGNOSIS — Z794 Long term (current) use of insulin: Secondary | ICD-10-CM | POA: Diagnosis not present

## 2017-07-15 DIAGNOSIS — Z881 Allergy status to other antibiotic agents status: Secondary | ICD-10-CM | POA: Diagnosis not present

## 2017-07-15 DIAGNOSIS — Z9049 Acquired absence of other specified parts of digestive tract: Secondary | ICD-10-CM | POA: Diagnosis not present

## 2017-07-15 DIAGNOSIS — R161 Splenomegaly, not elsewhere classified: Secondary | ICD-10-CM | POA: Diagnosis not present

## 2017-07-15 DIAGNOSIS — R740 Nonspecific elevation of levels of transaminase and lactic acid dehydrogenase [LDH]: Secondary | ICD-10-CM | POA: Diagnosis not present

## 2017-07-15 DIAGNOSIS — D649 Anemia, unspecified: Secondary | ICD-10-CM

## 2017-07-15 DIAGNOSIS — R231 Pallor: Secondary | ICD-10-CM | POA: Diagnosis not present

## 2017-07-15 DIAGNOSIS — M1990 Unspecified osteoarthritis, unspecified site: Secondary | ICD-10-CM | POA: Diagnosis not present

## 2017-07-15 DIAGNOSIS — Z79899 Other long term (current) drug therapy: Secondary | ICD-10-CM | POA: Diagnosis not present

## 2017-07-15 MED ORDER — SODIUM CHLORIDE 0.9 % IV SOLN
250.0000 mL | Freq: Once | INTRAVENOUS | Status: AC
  Administered 2017-07-15: 250 mL via INTRAVENOUS
  Filled 2017-07-15: qty 250

## 2017-07-15 MED ORDER — ACETAMINOPHEN 325 MG PO TABS
650.0000 mg | ORAL_TABLET | Freq: Once | ORAL | Status: AC
  Administered 2017-07-15: 650 mg via ORAL
  Filled 2017-07-15: qty 2

## 2017-07-15 MED ORDER — DIPHENHYDRAMINE HCL 25 MG PO CAPS
25.0000 mg | ORAL_CAPSULE | Freq: Once | ORAL | Status: AC
  Administered 2017-07-15: 25 mg via ORAL
  Filled 2017-07-15: qty 1

## 2017-07-16 LAB — BPAM PLATELET PHERESIS
Blood Product Expiration Date: 201811082359
ISSUE DATE / TIME: 201811060925
Unit Type and Rh: 6200

## 2017-07-16 LAB — TYPE AND SCREEN
ABO/RH(D): A POS
ANTIBODY SCREEN: NEGATIVE
UNIT DIVISION: 0

## 2017-07-16 LAB — PREPARE PLATELET PHERESIS: UNIT DIVISION: 0

## 2017-07-16 LAB — BPAM RBC
Blood Product Expiration Date: 201811212359
ISSUE DATE / TIME: 201811061010
Unit Type and Rh: 6200

## 2017-07-16 NOTE — Unmapped (Signed)
[facility] Refill Coordination Note  Specialty Medication(s): IDHIFA 100mg       Jessica Wilson, DOB: 1941/05/13  Phone: [phone] (home) [phone] (work), Alternate phone contact: N/A  Phone or address changes today?: No  All above HIPAA information was verified with patient.  Shipping Address: [address]  [place] [address]   Insurance changes? No    Completed refill call assessment today to schedule patient's medication shipment from the [facility] [phone]).      Confirmed the medication and dosage are correct and have not changed: Yes, regimen is correct and unchanged.    Confirmed patient started or stopped the following medications in the past month:  No, there are no changes reported at this time.    Are you tolerating your medication?:  Jessica Wilson reports tolerating the medication.    ADHERENCE    Is this medicine transplant or covered by Medicare Part B? No.        Did you miss any doses in the past 4 weeks? No missed doses reported.    FINANCIAL/SHIPPING    Delivery Scheduled: Yes, Expected medication delivery date: 07/24/17     Jessica Wilson did not have any additional questions at this time.    Delivery address validated in [facility] scheduling system: Yes, address listed in [facility] is correct.    We will follow up with patient monthly for standard refill processing and delivery.      Thank you,  [employer]   [facility] Specialty Pharmacist

## 2017-07-17 ENCOUNTER — Emergency Department: Payer: Medicare Other

## 2017-07-17 ENCOUNTER — Other Ambulatory Visit: Payer: Self-pay

## 2017-07-17 ENCOUNTER — Encounter: Payer: Self-pay | Admitting: Emergency Medicine

## 2017-07-17 ENCOUNTER — Inpatient Hospital Stay: Payer: Medicare Other

## 2017-07-17 ENCOUNTER — Observation Stay
Admission: EM | Admit: 2017-07-17 | Discharge: 2017-07-19 | Disposition: A | Discharge status: Other Acute Inpatient Hospital | Payer: Medicare Other | Attending: Internal Medicine | Admitting: Internal Medicine

## 2017-07-17 DIAGNOSIS — D649 Anemia, unspecified: Secondary | ICD-10-CM

## 2017-07-17 DIAGNOSIS — Z794 Long term (current) use of insulin: Secondary | ICD-10-CM | POA: Diagnosis not present

## 2017-07-17 DIAGNOSIS — I129 Hypertensive chronic kidney disease with stage 1 through stage 4 chronic kidney disease, or unspecified chronic kidney disease: Secondary | ICD-10-CM | POA: Diagnosis not present

## 2017-07-17 DIAGNOSIS — H9190 Unspecified hearing loss, unspecified ear: Secondary | ICD-10-CM | POA: Diagnosis not present

## 2017-07-17 DIAGNOSIS — Z881 Allergy status to other antibiotic agents status: Secondary | ICD-10-CM | POA: Diagnosis not present

## 2017-07-17 DIAGNOSIS — R079 Chest pain, unspecified: Secondary | ICD-10-CM | POA: Diagnosis not present

## 2017-07-17 DIAGNOSIS — N183 Chronic kidney disease, stage 3 (moderate): Secondary | ICD-10-CM | POA: Diagnosis not present

## 2017-07-17 DIAGNOSIS — D696 Thrombocytopenia, unspecified: Secondary | ICD-10-CM | POA: Diagnosis not present

## 2017-07-17 DIAGNOSIS — Z6841 Body Mass Index (BMI) 40.0 and over, adult: Secondary | ICD-10-CM | POA: Diagnosis not present

## 2017-07-17 DIAGNOSIS — Z792 Long term (current) use of antibiotics: Secondary | ICD-10-CM | POA: Diagnosis not present

## 2017-07-17 DIAGNOSIS — M1990 Unspecified osteoarthritis, unspecified site: Secondary | ICD-10-CM | POA: Diagnosis not present

## 2017-07-17 DIAGNOSIS — I10 Essential (primary) hypertension: Secondary | ICD-10-CM | POA: Insufficient documentation

## 2017-07-17 DIAGNOSIS — E860 Dehydration: Secondary | ICD-10-CM | POA: Diagnosis not present

## 2017-07-17 DIAGNOSIS — M25512 Pain in left shoulder: Secondary | ICD-10-CM | POA: Insufficient documentation

## 2017-07-17 DIAGNOSIS — R011 Cardiac murmur, unspecified: Secondary | ICD-10-CM | POA: Diagnosis not present

## 2017-07-17 DIAGNOSIS — Z79899 Other long term (current) drug therapy: Secondary | ICD-10-CM | POA: Diagnosis not present

## 2017-07-17 DIAGNOSIS — E119 Type 2 diabetes mellitus without complications: Secondary | ICD-10-CM | POA: Diagnosis not present

## 2017-07-17 DIAGNOSIS — F419 Anxiety disorder, unspecified: Secondary | ICD-10-CM | POA: Insufficient documentation

## 2017-07-17 DIAGNOSIS — R109 Unspecified abdominal pain: Secondary | ICD-10-CM | POA: Diagnosis not present

## 2017-07-17 DIAGNOSIS — N200 Calculus of kidney: Secondary | ICD-10-CM | POA: Insufficient documentation

## 2017-07-17 DIAGNOSIS — Z823 Family history of stroke: Secondary | ICD-10-CM | POA: Insufficient documentation

## 2017-07-17 DIAGNOSIS — R7989 Other specified abnormal findings of blood chemistry: Secondary | ICD-10-CM | POA: Insufficient documentation

## 2017-07-17 DIAGNOSIS — E1122 Type 2 diabetes mellitus with diabetic chronic kidney disease: Secondary | ICD-10-CM | POA: Diagnosis not present

## 2017-07-17 DIAGNOSIS — Z8379 Family history of other diseases of the digestive system: Secondary | ICD-10-CM | POA: Insufficient documentation

## 2017-07-17 DIAGNOSIS — Z87442 Personal history of urinary calculi: Secondary | ICD-10-CM | POA: Insufficient documentation

## 2017-07-17 DIAGNOSIS — Z87891 Personal history of nicotine dependence: Secondary | ICD-10-CM | POA: Insufficient documentation

## 2017-07-17 DIAGNOSIS — R1012 Left upper quadrant pain: Secondary | ICD-10-CM | POA: Insufficient documentation

## 2017-07-17 DIAGNOSIS — Z9221 Personal history of antineoplastic chemotherapy: Secondary | ICD-10-CM | POA: Diagnosis not present

## 2017-07-17 DIAGNOSIS — E785 Hyperlipidemia, unspecified: Secondary | ICD-10-CM | POA: Diagnosis not present

## 2017-07-17 DIAGNOSIS — R0600 Dyspnea, unspecified: Secondary | ICD-10-CM | POA: Insufficient documentation

## 2017-07-17 DIAGNOSIS — R231 Pallor: Secondary | ICD-10-CM | POA: Diagnosis not present

## 2017-07-17 DIAGNOSIS — I2510 Atherosclerotic heart disease of native coronary artery without angina pectoris: Secondary | ICD-10-CM | POA: Insufficient documentation

## 2017-07-17 DIAGNOSIS — K219 Gastro-esophageal reflux disease without esophagitis: Secondary | ICD-10-CM | POA: Insufficient documentation

## 2017-07-17 DIAGNOSIS — Z90710 Acquired absence of both cervix and uterus: Secondary | ICD-10-CM | POA: Insufficient documentation

## 2017-07-17 DIAGNOSIS — R0789 Other chest pain: Secondary | ICD-10-CM | POA: Diagnosis not present

## 2017-07-17 DIAGNOSIS — Z8249 Family history of ischemic heart disease and other diseases of the circulatory system: Secondary | ICD-10-CM | POA: Insufficient documentation

## 2017-07-17 DIAGNOSIS — Z9049 Acquired absence of other specified parts of digestive tract: Secondary | ICD-10-CM | POA: Diagnosis not present

## 2017-07-17 DIAGNOSIS — N6019 Diffuse cystic mastopathy of unspecified breast: Secondary | ICD-10-CM | POA: Insufficient documentation

## 2017-07-17 DIAGNOSIS — C9202 Acute myeloblastic leukemia, in relapse: Principal | ICD-10-CM | POA: Insufficient documentation

## 2017-07-17 DIAGNOSIS — J449 Chronic obstructive pulmonary disease, unspecified: Secondary | ICD-10-CM | POA: Diagnosis not present

## 2017-07-17 DIAGNOSIS — Z833 Family history of diabetes mellitus: Secondary | ICD-10-CM | POA: Insufficient documentation

## 2017-07-17 DIAGNOSIS — D46Z Other myelodysplastic syndromes: Secondary | ICD-10-CM

## 2017-07-17 DIAGNOSIS — R161 Splenomegaly, not elsewhere classified: Secondary | ICD-10-CM | POA: Diagnosis not present

## 2017-07-17 DIAGNOSIS — D61818 Other pancytopenia: Secondary | ICD-10-CM | POA: Insufficient documentation

## 2017-07-17 DIAGNOSIS — G4730 Sleep apnea, unspecified: Secondary | ICD-10-CM | POA: Diagnosis not present

## 2017-07-17 DIAGNOSIS — C9200 Acute myeloblastic leukemia, not having achieved remission: Secondary | ICD-10-CM | POA: Diagnosis not present

## 2017-07-17 DIAGNOSIS — R740 Nonspecific elevation of levels of transaminase and lactic acid dehydrogenase [LDH]: Secondary | ICD-10-CM | POA: Diagnosis not present

## 2017-07-17 DIAGNOSIS — E1121 Type 2 diabetes mellitus with diabetic nephropathy: Secondary | ICD-10-CM | POA: Diagnosis not present

## 2017-07-17 DIAGNOSIS — I4891 Unspecified atrial fibrillation: Secondary | ICD-10-CM | POA: Insufficient documentation

## 2017-07-17 DIAGNOSIS — F329 Major depressive disorder, single episode, unspecified: Secondary | ICD-10-CM | POA: Insufficient documentation

## 2017-07-17 DIAGNOSIS — N179 Acute kidney failure, unspecified: Secondary | ICD-10-CM | POA: Insufficient documentation

## 2017-07-17 LAB — HEPATIC FUNCTION PANEL
ALK PHOS: 77 U/L (ref 38–126)
ALT: 19 U/L (ref 14–54)
AST: 29 U/L (ref 15–41)
Albumin: 3.6 g/dL (ref 3.5–5.0)
BILIRUBIN DIRECT: 0.1 mg/dL (ref 0.1–0.5)
BILIRUBIN INDIRECT: 1.1 mg/dL — AB (ref 0.3–0.9)
TOTAL PROTEIN: 8 g/dL (ref 6.5–8.1)
Total Bilirubin: 1.2 mg/dL (ref 0.3–1.2)

## 2017-07-17 LAB — BASIC METABOLIC PANEL
Anion gap: 10 (ref 5–15)
BUN: 16 mg/dL (ref 6–20)
CHLORIDE: 103 mmol/L (ref 101–111)
CO2: 24 mmol/L (ref 22–32)
CREATININE: 1.37 mg/dL — AB (ref 0.44–1.00)
Calcium: 9.2 mg/dL (ref 8.9–10.3)
GFR calc Af Amer: 43 mL/min — ABNORMAL LOW (ref >60)
GFR calc non Af Amer: 37 mL/min — ABNORMAL LOW (ref >60)
Glucose, Bld: 114 mg/dL — ABNORMAL HIGH (ref 65–99)
POTASSIUM: 3.6 mmol/L (ref 3.5–5.1)
Sodium: 137 mmol/L (ref 135–145)

## 2017-07-17 LAB — CBC WITH DIFFERENTIAL/PLATELET
BLASTS: 35 %
Band Neutrophils: 2 %
Basophils Absolute: 0 10*3/uL (ref 0–0.1)
Basophils Relative: 0 %
Eosinophils Absolute: 0 10*3/uL (ref 0–0.7)
Eosinophils Relative: 0 %
HEMATOCRIT: 23.5 % — AB (ref 35.0–47.0)
HEMOGLOBIN: 8 g/dL — AB (ref 12.0–16.0)
LYMPHS ABS: 1.4 10*3/uL (ref 1.0–3.6)
LYMPHS PCT: 24 %
MCH: 32 pg (ref 26.0–34.0)
MCHC: 34.2 g/dL (ref 32.0–36.0)
MCV: 93.4 fL (ref 80.0–100.0)
Metamyelocytes Relative: 3 %
Monocytes Absolute: 0.9 10*3/uL (ref 0.2–0.9)
Monocytes Relative: 16 %
Myelocytes: 5 %
NEUTROS ABS: 1.5 10*3/uL (ref 1.4–6.5)
NEUTROS PCT: 15 %
NRBC: 2 /100{WBCs} — AB
Platelets: 26 10*3/uL — CL (ref 150–400)
RBC: 2.52 MIL/uL — AB (ref 3.80–5.20)
RDW: 19.3 % — AB (ref 11.5–14.5)
WBC: 5.8 10*3/uL (ref 3.6–11.0)

## 2017-07-17 LAB — LACTIC ACID, PLASMA
Lactic Acid, Venous: 1.3 mmol/L (ref 0.5–1.9)
Lactic Acid, Venous: 1.9 mmol/L (ref 0.5–1.9)

## 2017-07-17 LAB — CBC
HEMATOCRIT: 25.3 % — AB (ref 35.0–47.0)
Hemoglobin: 8.3 g/dL — ABNORMAL LOW (ref 12.0–16.0)
MCH: 30.7 pg (ref 26.0–34.0)
MCHC: 32.8 g/dL (ref 32.0–36.0)
MCV: 93.5 fL (ref 80.0–100.0)
PLATELETS: 29 10*3/uL — AB (ref 150–440)
RBC: 2.71 MIL/uL — ABNORMAL LOW (ref 3.80–5.20)
RDW: 19.1 % — AB (ref 11.5–14.5)
WBC: 6.1 10*3/uL (ref 3.6–11.0)

## 2017-07-17 LAB — LIPASE, BLOOD: Lipase: 38 U/L (ref 11–51)

## 2017-07-17 LAB — GLUCOSE, CAPILLARY
GLUCOSE-CAPILLARY: 151 mg/dL — AB (ref 65–99)
GLUCOSE-CAPILLARY: 117 mg/dL — AB (ref 65–99)

## 2017-07-17 LAB — SAMPLE TO BLOOD BANK

## 2017-07-17 LAB — TROPONIN I: Troponin I: <0.03 ng/mL (ref <0.03)

## 2017-07-17 MED ORDER — IPRATROPIUM-ALBUTEROL 0.5-2.5 (3) MG/3ML IN SOLN
3.0000 mL | Freq: Once | RESPIRATORY_TRACT | Status: AC
  Administered 2017-07-17: 3 mL via RESPIRATORY_TRACT
  Filled 2017-07-17: qty 3

## 2017-07-17 MED ORDER — ONDANSETRON HCL 4 MG PO TABS: 4.0000 mg | ORAL_TABLET | Freq: Four times a day (QID) | ORAL | Status: DC | PRN

## 2017-07-17 MED ORDER — ROSUVASTATIN CALCIUM 20 MG PO TABS: 20.0000 mg | ORAL_TABLET | Freq: Every day | ORAL | Status: DC

## 2017-07-17 MED ORDER — IOPAMIDOL (ISOVUE-300) INJECTION 61%
30.0000 mL | Freq: Once | INTRAVENOUS | Status: AC | PRN
  Administered 2017-07-17: 30 mL via ORAL

## 2017-07-17 MED ORDER — ALUM & MAG HYDROXIDE-SIMETH 200-200-20 MG/5ML PO SUSP
30.0000 mL | Freq: Once | ORAL | Status: AC
  Administered 2017-07-17: 30 mL via ORAL
  Filled 2017-07-17: qty 30

## 2017-07-17 MED ORDER — DILTIAZEM HCL ER COATED BEADS 180 MG PO CP24
180.0000 mg | ORAL_CAPSULE | Freq: Every day | ORAL | Status: DC
  Administered 2017-07-18 – 2017-07-19 (×2): 180 mg via ORAL
  Filled 2017-07-17 (×3): qty 1

## 2017-07-17 MED ORDER — ONDANSETRON 4 MG PO TBDP
4.0000 mg | ORAL_TABLET | Freq: Three times a day (TID) | ORAL | Status: DC | PRN
  Filled 2017-07-17: qty 1

## 2017-07-17 MED ORDER — MORPHINE SULFATE (PF) 2 MG/ML IV SOLN
2.0000 mg | Freq: Once | INTRAVENOUS | Status: AC
  Administered 2017-07-17: 2 mg via INTRAVENOUS
  Filled 2017-07-17: qty 1

## 2017-07-17 MED ORDER — INSULIN ASPART 100 UNIT/ML ~~LOC~~ SOLN
0.0000 [IU] | Freq: Three times a day (TID) | SUBCUTANEOUS | Status: DC
  Administered 2017-07-18 – 2017-07-19 (×3): 2 [IU] via SUBCUTANEOUS
  Filled 2017-07-17 (×3): qty 1

## 2017-07-17 MED ORDER — SERTRALINE HCL 50 MG PO TABS
100.0000 mg | ORAL_TABLET | Freq: Every day | ORAL | Status: DC
  Administered 2017-07-18 – 2017-07-19 (×2): 100 mg via ORAL
  Filled 2017-07-17 (×3): qty 2

## 2017-07-17 MED ORDER — POLYETHYLENE GLYCOL 3350 17 G PO PACK: 17.0000 g | PACK | Freq: Every day | ORAL | Status: DC | PRN

## 2017-07-17 MED ORDER — ACYCLOVIR 200 MG PO CAPS
400.0000 mg | ORAL_CAPSULE | Freq: Two times a day (BID) | ORAL | Status: DC
  Administered 2017-07-17: 400 mg via ORAL
  Filled 2017-07-17 (×2): qty 2

## 2017-07-17 MED ORDER — ONDANSETRON HCL 4 MG/2ML IJ SOLN
4.0000 mg | Freq: Four times a day (QID) | INTRAMUSCULAR | Status: DC | PRN
  Administered 2017-07-18: 4 mg via INTRAVENOUS
  Filled 2017-07-17: qty 2

## 2017-07-17 MED ORDER — TRAMADOL HCL 50 MG PO TABS
100.0000 mg | ORAL_TABLET | Freq: Four times a day (QID) | ORAL | Status: DC | PRN
  Administered 2017-07-17 – 2017-07-18 (×2): 100 mg via ORAL
  Filled 2017-07-17 (×2): qty 2

## 2017-07-17 MED ORDER — IOPAMIDOL (ISOVUE-300) INJECTION 61%
80.0000 mL | Freq: Once | INTRAVENOUS | Status: AC | PRN
Start: 2017-07-17 — End: 2017-07-17
  Administered 2017-07-17: 80 mL via INTRAVENOUS

## 2017-07-17 MED ORDER — CLONAZEPAM 0.5 MG PO TABS
0.5000 mg | ORAL_TABLET | Freq: Two times a day (BID) | ORAL | Status: DC
  Administered 2017-07-17 – 2017-07-19 (×4): 0.5 mg via ORAL
  Filled 2017-07-17 (×4): qty 1

## 2017-07-17 MED ORDER — ALBUTEROL SULFATE (2.5 MG/3ML) 0.083% IN NEBU
2.5000 mg | INHALATION_SOLUTION | RESPIRATORY_TRACT | Status: DC | PRN
  Administered 2017-07-18 – 2017-07-19 (×2): 2.5 mg via RESPIRATORY_TRACT
  Filled 2017-07-17 (×2): qty 3

## 2017-07-17 MED ORDER — MORPHINE SULFATE (PF) 2 MG/ML IV SOLN
2.0000 mg | INTRAVENOUS | Status: DC | PRN
  Administered 2017-07-17 – 2017-07-18 (×3): 2 mg via INTRAVENOUS
  Filled 2017-07-17 (×3): qty 1

## 2017-07-17 MED ORDER — INSULIN ASPART 100 UNIT/ML ~~LOC~~ SOLN
0.0000 [IU] | Freq: Every day | SUBCUTANEOUS | Status: DC
  Administered 2017-07-19: 2 [IU] via SUBCUTANEOUS
  Filled 2017-07-17: qty 1

## 2017-07-17 MED ORDER — ENASIDENIB MESYLATE 100 MG PO TABS: 100.0000 mg | ORAL_TABLET | Freq: Every day | ORAL | Status: DC

## 2017-07-17 MED ORDER — BUPROPION HCL ER (XL) 300 MG PO TB24
300.0000 mg | ORAL_TABLET | Freq: Every day | ORAL | Status: DC
  Administered 2017-07-18 – 2017-07-19 (×2): 300 mg via ORAL
  Filled 2017-07-17 (×2): qty 1

## 2017-07-17 MED ORDER — ENOXAPARIN SODIUM 30 MG/0.3ML ~~LOC~~ SOLN: 30.0000 mg | SUBCUTANEOUS | Status: DC

## 2017-07-17 MED ORDER — SODIUM CHLORIDE 0.9 % IV BOLUS (SEPSIS)
500.0000 mL | Freq: Once | INTRAVENOUS | Status: AC
  Administered 2017-07-17: 500 mL via INTRAVENOUS

## 2017-07-17 MED ORDER — ONDANSETRON HCL 4 MG/2ML IJ SOLN
4.0000 mg | Freq: Once | INTRAMUSCULAR | Status: AC
  Administered 2017-07-17: 4 mg via INTRAVENOUS
  Filled 2017-07-17: qty 2

## 2017-07-17 NOTE — ED Notes (Signed)
Report given to shea rn floor nurse

## 2017-07-17 NOTE — ED Notes (Signed)
Lab called to draw lactic.

## 2017-07-17 NOTE — H&P (Signed)
Teresa Carrillo at [place] NAME: Teresa Carrillo    MR#:  [id number]  DATE OF BIRTH:  [birth year]  DATE OF ADMISSION:  07/17/2017  PRIMARY CARE PHYSICIAN: Roselee Nova, MD   REQUESTING/REFERRING PHYSICIAN: Reita Cliche  CHIEF COMPLAINT:   Acute left-sided abdominal pain HISTORY OF PRESENT ILLNESS:  Teresa Carrillo  is a 76 y.o. female with a known history of AML on chemotherapy, diabetes mellitus, hypertension, hyperlipidemia, COPD and multiple other medical problems is presenting to the ED with a chief complaint of acute left-sided abdominal pain radiating to the left chest and shoulder.  CT abdomen has revealed splenomegaly ED physician Dr. Reita Cliche has discussed with the patient's primary oncologist Dr. Ocie Doyne who has recommended admission for pain management.  Currently patient's pain is 10 out of 10 in the left upper quadrant of the abdomen.  Denies nausea vomiting.  Denies any fever.  Her last bowel movement today  PAST MEDICAL HISTORY:   Past Medical History:  Diagnosis Date  . Abdominal wall mass   . Anginal pain ([place])   . Anxiety   . Arthritis   . Calculus of kidney   . Cystitis   . Depression   . Diabetes mellitus without complication (HCC)    elevated A1c  . Dyspnea on exertion   . Elevated serum creatinine   . Fibrocystic breast disease   . GERD (gastroesophageal reflux disease)   . Hearing loss   . Heart murmur   . HTN (hypertension)   . Hyperlipidemia   . MDS (myelodysplastic syndrome), high grade ([place]) 04/23/2016  . Microscopic hematuria   . Mouth sores   . Mucositis due to chemotherapy   . Obesity   . Risk for falls   . Sleep apnea   . Thrombocytopenia ([place])   . Urinary frequency   . Urinary urgency     PAST SURGICAL HISTOIRY:   Past Surgical History:  Procedure Laterality Date  . ABDOMINAL HYSTERECTOMY    . APPENDECTOMY    . CARDIAC CATHETERIZATION     x2  . CHOLECYSTECTOMY    . DIAGNOSTIC LAPAROSCOPY      Removal of benign abdominal tumor  . right eye surgery Right     SOCIAL HISTORY:   Social History   Tobacco Use  . Smoking status: Former Smoker    Types: Cigarettes    Last attempt to quit: 09/09/1988    Years since quitting: 28.8  . Smokeless tobacco: Never Used  . Tobacco comment: quit 25 years ago  Substance Use Topics  . Alcohol use: No    Alcohol/week: 0.0 oz    FAMILY HISTORY:   Family History  Problem Relation Age of Onset  . Congestive Heart Failure Mother   . Diabetes Mother   . Coronary artery disease Mother   . Stroke Mother   . Cirrhosis Father     DRUG ALLERGIES:   Allergies  Allergen Reactions  . Macrobid [employer] Macro] Other (See Comments)    Reaction: unknown    REVIEW OF SYSTEMS:  CONSTITUTIONAL: No fever, fatigue or weakness.  EYES: No blurred or double vision.  EARS, NOSE, AND THROAT: No tinnitus or ear pain.  RESPIRATORY: No cough, shortness of breath, wheezing or hemoptysis.  CARDIOVASCULAR: No chest pain, orthopnea, edema.  GASTROINTESTINAL: No nausea, vomiting, diarrhea ; has severe left upper quadrant abdominal pain.  GENITOURINARY: No dysuria, hematuria.  ENDOCRINE: No polyuria, nocturia,  HEMATOLOGY: No anemia, easy bruising or bleeding  SKIN: No rash or lesion. MUSCULOSKELETAL: No joint pain or arthritis.   NEUROLOGIC: No tingling, numbness, weakness.  PSYCHIATRY: No anxiety or depression.   MEDICATIONS AT HOME:   Prior to Admission medications   Medication Sig Start Date End Date Taking? Authorizing Provider  acyclovir (ZOVIRAX) 400 MG tablet TAKE 1 TABLET(400 MG) BY MOUTH TWICE DAILY 05/26/17  Yes Cammie Sickle, MD  albuterol (PROVENTIL HFA) 108 (90 Base) MCG/ACT inhaler Inhale into the lungs. 11/28/16 11/28/17 Yes [provider]  buPROPion (WELLBUTRIN XL) 150 MG 24 hr tablet Take 300 mg by mouth daily at 12 noon.    Yes [provider]  clonazePAM (KLONOPIN) 0.5 MG tablet Take 0.5 mg by  mouth 2 (two) times daily.    Yes [provider]  diltiazem (CARDIZEM CD) 180 MG 24 hr capsule Take 1 capsule (180 mg total) by mouth daily. 03/27/17 03/27/18 Yes Darel Hong, MD  Enasidenib Mesylate 100 MG TABS Take 100 mg daily by mouth. 06/25/17 07/25/17 Yes [provider]  metFORMIN (GLUCOPHAGE) 500 MG tablet Take 1 tablet (500 mg total) by mouth daily. 06/17/17  Yes Rochel Brome A, MD  rosuvastatin (CRESTOR) 20 MG tablet TAKE 1 TABLET(20 MG) BY MOUTH AT BEDTIME 06/17/17  Yes Rochel Brome A, MD  sertraline (ZOLOFT) 100 MG tablet Take 100 mg by mouth daily at 12 noon.    Yes [provider]  hydrocortisone (ANUSOL-HC) 2.5 % rectal cream Place 1 application rectally 2 (two) times daily as needed for hemorrhoids or itching. Patient not taking: Reported on 06/30/2017 05/10/16   Henreitta Leber, MD  levofloxacin (LEVAQUIN) 500 MG tablet Take 1 tablet (500 mg total) by mouth daily. Patient not taking: Reported on 06/30/2017 06/02/17   Cammie Sickle, MD  magic mouthwash w/lidocaine SOLN Take 5 mLs by mouth 4 (four) times daily. 80 ml viscous lidocaine 2%, 80 ml Mylanta, 80 ml Diphenhydramine 12.5 mg/5 ml Elixir, 80 ml Nystatin 100,000 Unit suspension, 80 ml Prednisolone 15 mg/32ml, 80 ml Distilled Water.  Sig: Swish/Swallow 5-10 ml four times a day as needed. Dispense 480 ml. 3RFs Patient not taking: Reported on 06/30/2017 10/07/16   Cammie Sickle, MD  ondansetron (ZOFRAN-ODT) 4 MG disintegrating tablet Take 4 mg by mouth every 8 (eight) hours as needed for nausea or vomiting.    [provider]  polyethylene glycol (MIRALAX / GLYCOLAX) packet Take 17 g by mouth daily as needed for mild constipation. Patient not taking: Reported on 06/30/2017 05/10/16   Henreitta Leber, MD  prochlorperazine (COMPAZINE) 10 MG tablet Take 1 tablet (10 mg total) by mouth every 6 (six) hours as needed (Nausea or vomiting). 04/23/16   Cammie Sickle, MD       VITAL SIGNS:  Blood pressure (!) 152/63, pulse 76, temperature 99.3 F (37.4 C), temperature source Oral, resp. rate 18, height 5\' 4"  (1.626 m), weight 110.7 kg (244 lb), SpO2 98 %.  PHYSICAL EXAMINATION:  GENERAL:  76 y.o.-year-old patient lying in the bed with no acute distress.  Morbidly obese EYES: Pupils equal, round, reactive to light and accommodation. No scleral icterus. Extraocular muscles intact.  HEENT: Head atraumatic, normocephalic. Oropharynx and nasopharynx clear.  NECK:  Supple, no jugular venous distention. No thyroid enlargement, no tenderness.  LUNGS: Normal breath sounds bilaterally, no wheezing, rales,rhonchi or crepitation. No use of accessory muscles of respiration.  CARDIOVASCULAR: S1, S2 normal. No murmurs, rubs, or gallops.  ABDOMEN: Soft,  left upper quadrant is tender but  no rebound tenderness nondistended. Bowel sounds present. No organomegaly or mass.  EXTREMITIES: No pedal edema, cyanosis, or clubbing.  NEUROLOGIC: Cranial nerves II through XII are intact. Muscle strength 5/5 in all extremities. Sensation intact. Gait not checked.  PSYCHIATRIC: The patient is alert and oriented x 3.  SKIN: No obvious rash, lesion, or ulcer.   LABORATORY PANEL:   CBC Recent Labs  Lab 07/17/17 1229  WBC 6.1  HGB 8.3*  HCT 25.3*  PLT 29*   ------------------------------------------------------------------------------------------------------------------  Chemistries  Recent Labs  Lab 07/17/17 1229  NA 137  K 3.6  CL 103  CO2 24  GLUCOSE 114*  BUN 16  CREATININE 1.37*  CALCIUM 9.2  AST 29  ALT 19  ALKPHOS 77  BILITOT 1.2   ------------------------------------------------------------------------------------------------------------------  Cardiac Enzymes Recent Labs  Lab 07/17/17 1229  TROPONINI <0.03   ------------------------------------------------------------------------------------------------------------------  RADIOLOGY:  Dg Chest 2  View  Result Date: 07/17/2017 CLINICAL DATA:  Chest pain.  Ongoing chemotherapy for leukemia. EXAM: CHEST  2 VIEW COMPARISON:  03/27/2017 FINDINGS: Lungs are adequately inflated without focal airspace consolidation or effusion. Subtle stable prominence of the interstitial markings. Cardiomediastinal silhouette and remainder of the exam is unchanged. IMPRESSION: No acute cardiopulmonary disease. Subtle stable interstitial prominence. Electronically Signed   By: Marin Olp M.D.   On: 07/17/2017 13:05   Ct Abdomen Pelvis W Contrast  Result Date: 07/17/2017 CLINICAL DATA:  Left-sided chest pain beginning yesterday with dyspnea on exertion. Abdominal pain not specified. EXAM: CT ABDOMEN AND PELVIS WITH CONTRAST TECHNIQUE: Multidetector CT imaging of the abdomen and pelvis was performed using the standard protocol following bolus administration of intravenous contrast. CONTRAST:  20mL ISOVUE-300 IOPAMIDOL (ISOVUE-300) INJECTION 61% COMPARISON:  CT from 05/07/2016 and 12/08/2005 FINDINGS: Lower chest: Stable mild cardiomegaly without pericardial effusion. Scarring in the left lower lobe. Hepatobiliary: Punctate calcifications of the liver compatible with old granulomatous disease. Prior cholecystectomy. No biliary dilatation. Pancreas: Unremarkable. No pancreatic ductal dilatation or surrounding inflammatory changes. Spleen: Splenomegaly with the spleen measuring 15.9 x 14.8 x 5.8 cm (volume = 710 cm^3). Splenic granulomas are noted. Adrenals/Urinary Tract: Normal bilateral adrenal glands. Water attenuating cysts are re- demonstrated within both kidneys, the largest on the left up to 13 mm and on the right 14 mm. Nonobstructing calcifications are seen within the interpolar and lower aspect of the right kidney measuring up to 4 mm. No hydroureteronephrosis. The urinary bladder is unremarkable. Stomach/Bowel: Physiologic distention of the stomach with enteric contrast. No bowel obstruction is noted. Status post  appendectomy. Moderate fecal residue is noted from cecum to splenic flexure. No acute bowel inflammation. Vascular/Lymphatic: Aortoiliac atherosclerosis. There is a 16 mm short axis lymph node just below the gastroesophageal junction, increased in size from 10 mm previously. Small subcentimeter mesenteric lymph nodes are noted. Small retroperitoneal lymph nodes are also present without significant change. Small splenules are present in the left upper quadrant versus small lymph nodes. Reproductive: Status post hysterectomy. No adnexal masses. Other: Re- demonstration of nonspecific anterior left upper quadrant subcutaneous soft tissue nodule measuring approximately 2.5 x 2.1 x 1.7 cm, unchanged in appearance since recent comparison but dates back to 2007 likely representing a nonaggressive finding. Musculoskeletal: Small left iliac focus of sclerosis likely to represent a bone island. Mild disc space narrowing T12 through L4 with slight retrolisthesis grade 1 of L3 on L4. No aggressive appearing osseous abnormality. IMPRESSION: 1. There has been an interval increase in size of the spleen consistent splenomegaly since prior with the spleen now  measuring 15.9 x 14.8 x 5.8 cm, previously 12.9 x 4.4 x 11.6 cm. 2. Interval increase in size of a epigastric lymph node measuring 16 mm in short axis just below the GE junction versus 10 mm previously. 3. Stable cardiomegaly. 4. Hepatic granulomas.  Prior cholecystectomy. 5. Water attenuating cysts appear stable within both kidneys with nonobstructing right-sided renal calculi currently noted. 6. Stable left anterior upper abdominal wall subcutaneous nodule dating back to 2007, though increased in size from 2007. Stable since recent comparison however, currently 2.5 x 2.1 x 1.7 cm. 7. Lumbar spondylosis with facet arthropathy. Electronically Signed   By: Ashley Royalty M.D.   On: 07/17/2017 16:29    EKG:   Orders placed or performed during the hospital encounter of 07/17/17   . ED EKG within 10 minutes  . ED EKG within 10 minutes    IMPRESSION AND PLAN:      76 year old female with AML on chemotherapy is presenting with acute left-sided abdominal pain radiating to the left side of the chest.  And shoulder  #Acute left-sided abdominal pain from splenomegaly from underlying AML  Admit to MedSurg unit Pain management with tramadol and IV morphine IV fluids Supportive treatment Clear liquid diet  #AML currently with splenomegaly Oncology Dr. Ocie Doyne consulted he is aware, notified by Dr. Reita Cliche Currently on chemotherapy  #Diabetes mellitus Hold metformin provide sliding scale insulin  #Acute kidney injury-prerenal from poor p.o. intake secondary to acute abdominal pain Hydrate with IV fluids and avoid nephrotoxins.  Holding metformin   DVT prophylaxis with Lovenox      All the records are reviewed and case discussed with ED provider. Management plans discussed with the patient, family and they are in agreement.  CODE STATUS: FC, husband is healthcare power of attorney  TOTAL TIME TAKING CARE OF THIS PATIENT: 43 minutes.   Note: This dictation was prepared with Dragon dictation along with smaller phrase technology. Any transcriptional errors that result from this process are unintentional.  Nicholes Mango M.D on 07/17/2017 at 5:53 PM  Between 7am to 6pm - Pager - [phone]  After 6pm go to [email or web address] - password [email or web address] [facility]  [facility]  Office  [phone]  CC: Primary care physician; Roselee Nova, MD

## 2017-07-17 NOTE — ED Provider Notes (Signed)
[facility] Emergency Department Provider Note ____________________________________________   I have reviewed the triage vital signs and the triage nursing note.  HISTORY  Chief Complaint Chest Pain   Historian Patient  HPI Teresa KOLOSKI is a 76 y.o. female being treated at the cancer center for leukemia, has underlying COPD which severely limits her function, she is always short of breath when she goes around, presents today for what sounds like a gradual onset left-sided abdominal/chest pain located at left upper abdomen/lower chest pain.  She states it is fairly constant although a little bit waxing and waning.  No history of this before.  Denies any fevers.  States she was at the cancer center for routine blood work, when she discussed her pain she was sent to the ER for further evaluate him.  No change from baseline shortness of breath.  No pleuritic chest pain.  No changes in her bowels.  No urinary symptoms.  No known traumatic injury or overuse injury.  Does not report coughing. She is rating her pain as 10 out of 10 at present.   Past Medical History:  Diagnosis Date  . Abdominal wall mass   . Anginal pain ([place])   . Anxiety   . Arthritis   . Calculus of kidney   . Cystitis   . Depression   . Diabetes mellitus without complication (HCC)    elevated A1c  . Dyspnea on exertion   . Elevated serum creatinine   . Fibrocystic breast disease   . GERD (gastroesophageal reflux disease)   . Hearing loss   . Heart murmur   . HTN (hypertension)   . Hyperlipidemia   . MDS (myelodysplastic syndrome), high grade ([place]) 04/23/2016  . Microscopic hematuria   . Mouth sores   . Mucositis due to chemotherapy   . Obesity   . Risk for falls   . Sleep apnea   . Thrombocytopenia ([place])   . Urinary frequency   . Urinary urgency     Patient Active Problem List   Diagnosis Date Noted  . Acute myeloid leukemia in relapse ([place]) 06/02/2017  . Encounter for  antineoplastic chemotherapy 02/17/2017  . Type 2 diabetes mellitus with nephropathy ([place]) 01/24/2017  . Thrombocytopenia ([place]) 10/21/2016  . Mucosal bleeding 06/19/2016  . Proctitis 05/07/2016  . MDS (myelodysplastic syndrome), high grade ([place]) 04/23/2016  . Oral mucosal lesion 04/04/2016  . Anemia 02/21/2016  . Pancytopenia ([place]) 02/21/2016  . Tick-borne disease 02/21/2016  . Cough 02/19/2016  . Proteinuria 05/10/2015  . Microscopic hematuria 05/08/2015  . Renal stone 05/08/2015  . Dyslipidemia 05/05/2015  . Arteriosclerosis of coronary artery 02/02/2015  . H/O elevated lipids 02/02/2015  . H/O: HTN (hypertension) 02/02/2015  . Diabetes mellitus, type 2 ([place]) 02/02/2015  . COPD, mild ([place]) 05/02/2014  . Breathlessness on exertion 05/02/2014  . Abnormal respiratory rate 05/02/2014    Past Surgical History:  Procedure Laterality Date  . ABDOMINAL HYSTERECTOMY    . APPENDECTOMY    . CARDIAC CATHETERIZATION     x2  . CHOLECYSTECTOMY    . DIAGNOSTIC LAPAROSCOPY     Removal of benign abdominal tumor  . right eye surgery Right     Prior to Admission medications   Medication Sig Start Date End Date Taking? Authorizing Provider  acyclovir (ZOVIRAX) 400 MG tablet TAKE 1 TABLET(400 MG) BY MOUTH TWICE DAILY 05/26/17   Cammie Sickle, MD  albuterol (PROVENTIL HFA) 108 (90 Base) MCG/ACT inhaler Inhale into the lungs. 11/28/16 11/28/17  [provider]  buPROPion (WELLBUTRIN XL) 150 MG 24 hr tablet Take 300 mg by mouth daily at 12 noon.     [provider]  clonazePAM (KLONOPIN) 0.5 MG tablet Take 0.5 mg by mouth 2 (two) times daily.     [provider]  cyclopentolate (CYCLODRYL,CYCLOGYL) 1 % ophthalmic solution INT 1 GTT IN OS BID 05/07/17   [provider]  diltiazem (CARDIZEM CD) 180 MG 24 hr capsule Take 1 capsule (180 mg total) by mouth daily. Patient not taking: Reported on 06/30/2017 03/27/17 03/27/18  Darel Hong, MD  Enasidenib  Mesylate 100 MG TABS Take 100 mg by mouth. 06/25/17 07/25/17  [provider]  hydrocortisone (ANUSOL-HC) 2.5 % rectal cream Place 1 application rectally 2 (two) times daily as needed for hemorrhoids or itching. Patient not taking: Reported on 06/30/2017 05/10/16   Henreitta Leber, MD  levofloxacin (LEVAQUIN) 500 MG tablet Take 1 tablet (500 mg total) by mouth daily. Patient not taking: Reported on 06/30/2017 06/02/17   Cammie Sickle, MD  LUMIGAN 0.01 % SOLN Place 1 drop into both eyes at bedtime.  04/12/15   [provider]  magic mouthwash w/lidocaine SOLN Take 5 mLs by mouth 4 (four) times daily. 80 ml viscous lidocaine 2%, 80 ml Mylanta, 80 ml Diphenhydramine 12.5 mg/5 ml Elixir, 80 ml Nystatin 100,000 Unit suspension, 80 ml Prednisolone 15 mg/38ml, 80 ml Distilled Water.  Sig: Swish/Swallow 5-10 ml four times a day as needed. Dispense 480 ml. 3RFs Patient not taking: Reported on 06/30/2017 10/07/16   Cammie Sickle, MD  metFORMIN (GLUCOPHAGE) 500 MG tablet Take 1 tablet (500 mg total) by mouth daily. 06/17/17   Roselee Nova, MD  Multiple Vitamin (MULTIVITAMIN WITH MINERALS) TABS tablet Take 1 tablet by mouth daily.    [provider]  Omega-3 Fatty Acids (FISH OIL) 1000 MG CAPS Take 1 capsule by mouth daily.    [provider]  omeprazole (PRILOSEC) 40 MG capsule  03/23/15   [provider]  ondansetron (ZOFRAN-ODT) 4 MG disintegrating tablet Take 4 mg by mouth every 8 (eight) hours as needed for nausea or vomiting.    [provider]  polyethylene glycol (MIRALAX / GLYCOLAX) packet Take 17 g by mouth daily as needed for mild constipation. Patient not taking: Reported on 06/30/2017 05/10/16   Henreitta Leber, MD  prochlorperazine (COMPAZINE) 10 MG tablet Take 1 tablet (10 mg total) by mouth every 6 (six) hours as needed (Nausea or vomiting). 04/23/16   Cammie Sickle, MD  rosuvastatin (CRESTOR) 20 MG tablet TAKE 1 TABLET(20  MG) BY MOUTH AT BEDTIME 06/17/17   Roselee Nova, MD  sertraline (ZOLOFT) 100 MG tablet Take 100 mg by mouth daily at 12 noon.     [provider]  Umeclidinium-Vilanterol (ANORO ELLIPTA) 62.5-25 MCG/INH AEPB Inhale 1 puff into the lungs daily as needed (shortness of breath).  05/17/14   [provider]    Allergies  Allergen Reactions  . Macrobid [employer] Macro] Other (See Comments)    Reaction: unknown    Family History  Problem Relation Age of Onset  . Congestive Heart Failure Mother   . Diabetes Mother   . Coronary artery disease Mother   . Stroke Mother   . Cirrhosis Father     Social History Social History   Tobacco Use  . Smoking status: Former Smoker    Types: Cigarettes    Last attempt to quit: 09/09/1988  Years since quitting: 28.8  . Smokeless tobacco: Never Used  . Tobacco comment: quit 25 years ago  Substance Use Topics  . Alcohol use: No    Alcohol/week: 0.0 oz  . Drug use: No    Review of Systems  Constitutional: Negative for fever. Eyes: Negative for visual changes. ENT: Negative for sore throat. Cardiovascular: Negative for chest pain. Respiratory: Positive for chronic shortness of breath. Gastrointestinal: Negative for vomiting and diarrhea. Genitourinary: Negative for dysuria. Musculoskeletal: Negative for back pain. Skin: Negative for rash. Neurological: Negative for headache.  ____________________________________________   PHYSICAL EXAM:  VITAL SIGNS: ED Triage Vitals  Enc Vitals Group     BP 07/17/17 1227 (!) 152/63     Pulse Rate 07/17/17 1227 76     Resp 07/17/17 1227 18     Temp 07/17/17 1227 99.3 F (37.4 C)     Temp Source 07/17/17 1227 Oral     SpO2 07/17/17 1227 98 %     Weight 07/17/17 1228 244 lb (110.7 kg)     Height 07/17/17 1228 5\' 4"  (1.626 m)     Head Circumference --      Peak Flow --      Pain Score 07/17/17 1227 8     Pain Loc --      Pain Edu? --      Excl. in [place]? --       Constitutional: Alert and oriented. Well appearing and in no distress. HEENT   Head: Normocephalic and atraumatic.      Eyes: Conjunctivae are normal. Pupils equal and round.       Ears:         Nose: No congestion/rhinnorhea.   Mouth/Throat: Mucous membranes are moist.   Neck: No stridor. Cardiovascular/Chest: Normal rate, regular rhythm.  No murmurs, rubs, or gallops. Respiratory: Normal respiratory effort without tachypnea nor retractions. Breath sounds are clear and equal bilaterally. No wheezes/rales/rhonchi. Gastrointestinal: Soft. No distention, no guarding, no rebound.  Tender in the epigastrium and left upper quadrant.  Morbidly obese. Genitourinary/rectal:Deferred Musculoskeletal: Nontender with normal range of motion in all extremities. No joint effusions.  No lower extremity tenderness.  No edema. Neurologic:  Normal speech and language. No gross or focal neurologic deficits are appreciated. Skin:  Skin is warm, dry and intact. No rash noted. Psychiatric: Mood and affect are normal. Speech and behavior are normal. Patient exhibits appropriate insight and judgment.   ____________________________________________  LABS (pertinent positives/negatives) I, Lisa Roca, MD the attending physician have reviewed the labs noted below.  Labs Reviewed  BASIC METABOLIC PANEL - Abnormal; Notable for the following components:      Result Value   Glucose, Bld 114 (*)    Creatinine, Ser 1.37 (*)    GFR calc non Af Amer 37 (*)    GFR calc Af Amer 43 (*)    All other components within normal limits  CBC - Abnormal; Notable for the following components:   RBC 2.71 (*)    Hemoglobin 8.3 (*)    HCT 25.3 (*)    RDW 19.1 (*)    Platelets 29 (*)    All other components within normal limits  HEPATIC FUNCTION PANEL - Abnormal; Notable for the following components:   Indirect Bilirubin 1.1 (*)    All other components within normal limits  TROPONIN I  LACTIC ACID, PLASMA   LIPASE, BLOOD  LACTIC ACID, PLASMA    ____________________________________________    EKG I, Lisa Roca, MD, the attending physician have  personally viewed and interpreted all ECGs.  76 bpm normal sinus rhythm.  Narrow QS renal axis.  Nonspecific ST and T wave ____________________________________________  RADIOLOGY All Xrays were viewed by me.  Imaging interpreted by Radiologist, and I, Lisa Roca, MD the attending physician have reviewed the radiologist interpretation noted below.  Chest xray:  IMPRESSION: No acute cardiopulmonary disease.  Subtle stable interstitial prominence.  CT abdomen pelvis with contrast:IMPRESSION: 1. There has been an interval increase in size of the spleen consistent splenomegaly since prior with the spleen now measuring 15.9 x 14.8 x 5.8 cm, previously 12.9 x 4.4 x 11.6 cm. 2. Interval increase in size of a epigastric lymph node measuring 16 mm in short axis just below the GE junction versus 10 mm previously. 3. Stable cardiomegaly. 4. Hepatic granulomas. Prior cholecystectomy. 5. Water attenuating cysts appear stable within both kidneys with nonobstructing right-sided renal calculi currently noted. 6. Stable left anterior upper abdominal wall subcutaneous nodule dating back to 2007, though increased in size from 2007. Stable since recent comparison however, currently 2.5 x 2.1 x 1.7 cm. 7. Lumbar spondylosis with facet arthropathy. __________________________________________  PROCEDURES  Procedure(s) performed: None  Critical Care performed: None   ____________________________________________  ED COURSE / ASSESSMENT AND PLAN  Pertinent labs & imaging results that were available during my care of the patient were reviewed by me and considered in my medical decision making (see chart for details).   Patient is having some wheezing and trouble breathing related to underlying COPD and was given a DuoNeb treatment here.  Her  complaint of pain that starts right at the diaphragm level but more so in the abdomen she states it also radiates up into the left side.  EKG is overall reassuring.  Laboratory studies including troponin are reassuring, it has been over 4 hours since symptom onset and I think ACS is unlikely.  She seems to think that it is localizing to the abdomen.  She has some epigastric tenderness on palpation so I did try some Maalox.  However she also has lateral symptoms I did add on lipase and hepatic function panel.  And we discussed obtaining CT for further investigation.  Patient received 2 mg of morphine and had some relief although she was still in a fair amount of pain and when I went to reexamine her around 5, to discuss my discussion with Dr. Rogue Bussing, she was back up to 10 out of 10 pain.  I am a little hesitant to give her larger doses of narcotics at a time due to her respiratory status is not that great am concerned about respiratory depression.  I am getting of her a second dose of 2 mg morphine and then try to reassess closely and try to get her something more reasonably pain control.  Oncologist discussed that spinal megaly may be part of the progression of her underlying cancer.  I discussed this with the patient.  Given that she is unable to have adequate pain control, with new spelled megaly, will admit to the hospitalist for further management treatment of symptoms, and Dr. Rogue Bussing will see in the hospital tomorrow.  DIFFERENTIAL DIAGNOSIS: Differential includes, but is not limited to, viral syndrome, bronchitis including COPD exacerbation, pneumonia, reactive airway disease including asthma, CHF including exacerbation with or without pulmonary/interstitial edema, pneumothorax, ACS, thoracic trauma, and pulmonary embolism. CONSULTATIONS:   None   Patient / Family / Caregiver informed of clinical course, medical decision-making process, and agree with  plan.  ___________________________________________  ED Discharge Orders    None      ___________________________________________   FINAL CLINICAL IMPRESSION(S) / ED DIAGNOSES   Final diagnoses:  Chronic anemia  Thrombocytopenia ([place])  Splenomegaly              Note: This dictation was prepared with Dragon dictation. Any transcriptional errors that result from this process are unintentional    Lisa Roca, MD 07/17/17 1724

## 2017-07-17 NOTE — ED Triage Notes (Signed)
Pt brought over from [facility], reports here for repeat lab work, sent over due to complaints of left side chest pain beginning yesterday, reports shortness of breath with exertion.  Pt A/Ox4, vitals WDL, NAD noted at this time.

## 2017-07-17 NOTE — ED Notes (Signed)
md with pt   meds given.  Pt drinking juice now.  Pt waiitng on admission.

## 2017-07-17 NOTE — ED Notes (Signed)
reumed care from teresa.  Pt in hallway bed.  Pt drinking po contrast   Iv fluids infusing.  Pt alert.

## 2017-07-17 NOTE — ED Notes (Signed)
Patient transported to CT 

## 2017-07-18 ENCOUNTER — Ambulatory Visit: Payer: Medicare Other

## 2017-07-18 ENCOUNTER — Other Ambulatory Visit: Payer: Self-pay

## 2017-07-18 DIAGNOSIS — C9202 Acute myeloblastic leukemia, in relapse: Secondary | ICD-10-CM | POA: Diagnosis not present

## 2017-07-18 DIAGNOSIS — R7989 Other specified abnormal findings of blood chemistry: Secondary | ICD-10-CM

## 2017-07-18 DIAGNOSIS — R1012 Left upper quadrant pain: Secondary | ICD-10-CM

## 2017-07-18 DIAGNOSIS — I129 Hypertensive chronic kidney disease with stage 1 through stage 4 chronic kidney disease, or unspecified chronic kidney disease: Secondary | ICD-10-CM | POA: Diagnosis not present

## 2017-07-18 DIAGNOSIS — R161 Splenomegaly, not elsewhere classified: Secondary | ICD-10-CM | POA: Diagnosis not present

## 2017-07-18 DIAGNOSIS — D696 Thrombocytopenia, unspecified: Secondary | ICD-10-CM

## 2017-07-18 DIAGNOSIS — N183 Chronic kidney disease, stage 3 (moderate): Secondary | ICD-10-CM | POA: Diagnosis not present

## 2017-07-18 DIAGNOSIS — Z79899 Other long term (current) drug therapy: Secondary | ICD-10-CM

## 2017-07-18 DIAGNOSIS — E1122 Type 2 diabetes mellitus with diabetic chronic kidney disease: Secondary | ICD-10-CM

## 2017-07-18 DIAGNOSIS — D649 Anemia, unspecified: Secondary | ICD-10-CM

## 2017-07-18 DIAGNOSIS — K219 Gastro-esophageal reflux disease without esophagitis: Secondary | ICD-10-CM

## 2017-07-18 DIAGNOSIS — R630 Anorexia: Secondary | ICD-10-CM

## 2017-07-18 DIAGNOSIS — C9200 Acute myeloblastic leukemia, not having achieved remission: Secondary | ICD-10-CM | POA: Diagnosis not present

## 2017-07-18 DIAGNOSIS — M1990 Unspecified osteoarthritis, unspecified site: Secondary | ICD-10-CM

## 2017-07-18 DIAGNOSIS — D4622 Refractory anemia with excess of blasts 2: Secondary | ICD-10-CM | POA: Diagnosis not present

## 2017-07-18 DIAGNOSIS — K069 Disorder of gingiva and edentulous alveolar ridge, unspecified: Secondary | ICD-10-CM

## 2017-07-18 DIAGNOSIS — R945 Abnormal results of liver function studies: Secondary | ICD-10-CM

## 2017-07-18 DIAGNOSIS — Z87891 Personal history of nicotine dependence: Secondary | ICD-10-CM

## 2017-07-18 DIAGNOSIS — E785 Hyperlipidemia, unspecified: Secondary | ICD-10-CM

## 2017-07-18 LAB — COMPREHENSIVE METABOLIC PANEL
ALBUMIN: 3.1 g/dL — AB (ref 3.5–5.0)
ALK PHOS: 220 U/L — AB (ref 38–126)
ALT: 269 U/L — ABNORMAL HIGH (ref 14–54)
ANION GAP: 8 (ref 5–15)
AST: 492 U/L — ABNORMAL HIGH (ref 15–41)
BILIRUBIN TOTAL: 1.8 mg/dL — AB (ref 0.3–1.2)
BUN: 11 mg/dL (ref 6–20)
CALCIUM: 8.5 mg/dL — AB (ref 8.9–10.3)
CO2: 25 mmol/L (ref 22–32)
Chloride: 104 mmol/L (ref 101–111)
Creatinine, Ser: 1.4 mg/dL — ABNORMAL HIGH (ref 0.44–1.00)
GFR calc Af Amer: 41 mL/min — ABNORMAL LOW (ref >60)
GFR calc non Af Amer: 36 mL/min — ABNORMAL LOW (ref >60)
GLUCOSE: 107 mg/dL — AB (ref 65–99)
Potassium: 3.3 mmol/L — ABNORMAL LOW (ref 3.5–5.1)
Sodium: 137 mmol/L (ref 135–145)
TOTAL PROTEIN: 7 g/dL (ref 6.5–8.1)

## 2017-07-18 LAB — CBC
HCT: 22.4 % — ABNORMAL LOW (ref 35.0–47.0)
HEMOGLOBIN: 7.5 g/dL — AB (ref 12.0–16.0)
MCH: 31.1 pg (ref 26.0–34.0)
MCHC: 33.2 g/dL (ref 32.0–36.0)
MCV: 93.7 fL (ref 80.0–100.0)
Platelets: 15 10*3/uL — CL (ref 150–440)
RBC: 2.4 MIL/uL — ABNORMAL LOW (ref 3.80–5.20)
RDW: 18.9 % — AB (ref 11.5–14.5)
WBC: 4.2 10*3/uL (ref 3.6–11.0)

## 2017-07-18 LAB — PROTIME-INR
INR: 1.22
Prothrombin Time: 15.3 seconds — ABNORMAL HIGH (ref 11.4–15.2)

## 2017-07-18 LAB — GLUCOSE, CAPILLARY
GLUCOSE-CAPILLARY: 101 mg/dL — AB (ref 65–99)
Glucose-Capillary: 153 mg/dL — ABNORMAL HIGH (ref 65–99)
Glucose-Capillary: 191 mg/dL — ABNORMAL HIGH (ref 65–99)
Glucose-Capillary: 204 mg/dL — ABNORMAL HIGH (ref 65–99)

## 2017-07-18 LAB — MAGNESIUM: MAGNESIUM: 1.7 mg/dL (ref 1.7–2.4)

## 2017-07-18 LAB — URIC ACID: Uric Acid, Serum: 4.9 mg/dL (ref 2.3–6.6)

## 2017-07-18 LAB — PREPARE RBC (CROSSMATCH)

## 2017-07-18 LAB — PHOSPHORUS: Phosphorus: 3.5 mg/dL (ref 2.5–4.6)

## 2017-07-18 MED ORDER — OXYCODONE HCL 5 MG PO TABS
5.0000 mg | ORAL_TABLET | Freq: Four times a day (QID) | ORAL | Status: DC | PRN
  Administered 2017-07-18: 5 mg via ORAL
  Filled 2017-07-18: qty 1

## 2017-07-18 MED ORDER — MIDODRINE HCL 5 MG PO TABS
10.0000 mg | ORAL_TABLET | Freq: Three times a day (TID) | ORAL | Status: DC
  Administered 2017-07-18 – 2017-07-19 (×3): 10 mg via ORAL
  Filled 2017-07-18 (×7): qty 2

## 2017-07-18 MED ORDER — FUROSEMIDE 10 MG/ML IJ SOLN
20.0000 mg | Freq: Once | INTRAMUSCULAR | Status: AC
  Administered 2017-07-18: 20 mg via INTRAVENOUS
  Filled 2017-07-18: qty 2

## 2017-07-18 MED ORDER — HEPARIN SOD (PORK) LOCK FLUSH 100 UNIT/ML IV SOLN: 500.0000 [IU] | Freq: Every day | INTRAVENOUS | Status: DC | PRN

## 2017-07-18 MED ORDER — SODIUM CHLORIDE 0.9% FLUSH: 3.0000 mL | INTRAVENOUS | Status: DC | PRN

## 2017-07-18 MED ORDER — SODIUM CHLORIDE 0.9% FLUSH: 10.0000 mL | INTRAVENOUS | Status: DC | PRN

## 2017-07-18 MED ORDER — DIPHENHYDRAMINE HCL 25 MG PO CAPS
25.0000 mg | ORAL_CAPSULE | Freq: Once | ORAL | Status: AC
  Administered 2017-07-18: 10:00:00 25 mg via ORAL
  Filled 2017-07-18: qty 1

## 2017-07-18 MED ORDER — ACETAMINOPHEN 325 MG PO TABS
650.0000 mg | ORAL_TABLET | Freq: Once | ORAL | Status: AC
  Administered 2017-07-18: 650 mg via ORAL
  Filled 2017-07-18: qty 2

## 2017-07-18 MED ORDER — DEXAMETHASONE SODIUM PHOSPHATE 10 MG/ML IJ SOLN
10.0000 mg | Freq: Two times a day (BID) | INTRAMUSCULAR | Status: DC
  Administered 2017-07-18 – 2017-07-19 (×3): 10 mg via INTRAVENOUS
  Filled 2017-07-18 (×3): qty 1

## 2017-07-18 MED ORDER — HEPARIN SOD (PORK) LOCK FLUSH 100 UNIT/ML IV SOLN: 250.0000 [IU] | INTRAVENOUS | Status: DC | PRN

## 2017-07-18 MED ORDER — POTASSIUM CHLORIDE CRYS ER 20 MEQ PO TBCR
20.0000 meq | EXTENDED_RELEASE_TABLET | Freq: Once | ORAL | Status: AC
  Administered 2017-07-18: 15:00:00 20 meq via ORAL
  Filled 2017-07-18: qty 1

## 2017-07-18 MED ORDER — SODIUM CHLORIDE 0.9 % IV SOLN
250.0000 mL | Freq: Once | INTRAVENOUS | Status: AC
  Administered 2017-07-18: 250 mL via INTRAVENOUS

## 2017-07-18 NOTE — Unmapped (Signed)
Transfer Center Request Note    Date and Time: July 18, 2017 9:44 AM    Requesting Physician: Dr. Allena Katz    Requesting [facility]: [facility]    Requesting Service: Hospitalist     Reason for Transfer: on enasidinib, worsening splenomegaly    Patient been to [facility] before? yes - Dr. Malen Gauze    [facility] Course:   [name] is a 76 yo F with AML from MDS on enasidinib. She progressed to AML on 5-AZA 05/2017 after 1 year of therapy. She was started on enasidenib 10 mg daily 06/28/17 and had previously tolerated well without signs of differentiation syndrome. Presented yesterday with abdominal pain. Dr. Donneta Romberg saw her. He reports LFTs are elevated but does not have the values. CT shows splenomegaly. WBC 4.2, Hb 7.5, platelets 15K. Na 137, K 3.3, Cl 104, CO2 25, glucose 107, BUN 11, Cr 1.40. Did not check uric acid level, asked them to check. They are concerned about differentiation syndrome from enasidenib as well as worsening AML.  Dr. Donneta Romberg talked to Dr. Malen Gauze who agreed she should transfer here. Dr. Malen Gauze has instructed the team to hold enasidenib. They have started her on dexamethasone 10 mg IV q12 hours. Also giving pRBCs and platelets.     Plan Upon Arrival:   -- admit to MDE1 service  -- obtain CBC w/ diff, CMP TLS labs (LDH, uric acid, phos), PT-INR/PTT  -- continue to hold enasidenib  -- continue dexamethasone 10 mg IV q12 hours  -- continue to support with blood transfusions if Hb < 7, platelets < 10K    Bed Type: Floor    Accepting Service:   MDE1    Recommendations discussed with Dr. Malen Gauze.     Checklist for admission to [facility] from outside ED:  MUST MEET THE FOLLOWING VITAL PARAMATERS:  HR >60 and <120 yes HR 87  SBP >90 and <180 yes 104/50  Respirations >8 and <25 yes RR 20  O2 sat >90% on < or = to 40% or stable home O2 requirement yes 98% on 2L [place]    SHOULD NOT HAVE ANY OF THE FOLLOWING (MUST ALL BE NO ANSWERS):  Concern for neurologic complications related to cancer or treatments no  Acute AMS no  Positive troponin no  Concern for leukostasis no  Severe sepsis/hemodynamic instability no  Differential includes sepsis or leukostasis no  Supportive nursing interventions at least every 2 hours no  Drips, titratable meds or meds requiring frequent monitoring no  Vitals/Assessments/Labs more than every 4 hours no  Undiagnosed malignancy no  Unclear which primary service would best serve the patient no      Please page Med E1 resident [phone] when patient arrives.     Fellow Triaging Request:  Starr Sinclair

## 2017-07-18 NOTE — Consult Note (Addendum)
[facility] CONSULT NOTE  Patient Care Team: Roselee Nova, [name]  CHIEF COMPLAINTS/PURPOSE OF CONSULTATION:  Acute myeloid leukemia  HISTORY OF PRESENTING ILLNESS:  Teresa Carrillo 76 y.o.  female with a history of acute myeloid leukemia- with ID H mutation- most recently on IDH-inhibitor [in conjunction with Dr.Foster, [facility]]- is currently admitted to the hospital for worsening abdominal pain.  Patient states she noted to have worsening fairly acute onset of abdominal pain left upper quadrant for the last 1-2 days. It was progressively getting worse. Patient was in the [facility] to have her labs drawn- and then taken to the emergency room for further evaluation. In the emergency room patient had a CT scan that showed worsening splenomegaly [compared to August 2017]; otherwise no acute process. Of note patient's CBC this morning shows hemoglobin is 7.8 platelets of 15. Patient's creatinine is slightly elevated at 1.4 [baseline 1.2-1.3.]. Interestingly this morning patient's AST ALT 5-10 times normal; with a bilirubin of  1.8. Chest x-ray did not show any acute process.   Patient currently has been getting morphine for pain control. Patient states her pain is fairly controlled when she is resting however when she moves up and about taking deep breaths causes worsening of the left upper quadrant pain. Poor appetite. She complains of gingival bleeding. No significant weight gain or swelling in the legs. Denies any significant worsening or shortness of breath. No fevers or chills. Overall she feels poorly.  ROS: A complete 10 point review of system is done which is negative except mentioned above in history of present illness  MEDICAL HISTORY:  Past Medical History:  Diagnosis Date  . Abdominal wall mass   . Anginal pain ([place])   . Anxiety   . Arthritis   . Calculus of kidney   . Cystitis   . Depression   . Diabetes mellitus without  complication (HCC)    elevated A1c  . Dyspnea on exertion   . Elevated serum creatinine   . Fibrocystic breast disease   . GERD (gastroesophageal reflux disease)   . Hearing loss   . Heart murmur   . HTN (hypertension)   . Hyperlipidemia   . MDS (myelodysplastic syndrome), high grade ([place]) 04/23/2016  . Microscopic hematuria   . Mouth sores   . Mucositis due to chemotherapy   . Obesity   . Risk for falls   . Sleep apnea   . Thrombocytopenia ([place])   . Urinary frequency   . Urinary urgency     SURGICAL HISTORY: Past Surgical History:  Procedure Laterality Date  . ABDOMINAL HYSTERECTOMY    . APPENDECTOMY    . CARDIAC CATHETERIZATION     x2  . CHOLECYSTECTOMY    . DIAGNOSTIC LAPAROSCOPY     Removal of benign abdominal tumor  . right eye surgery Right     SOCIAL HISTORY: Social History   Socioeconomic History  . Marital status: Married    Spouse name: Not on file  . Number of children: Not on file  . Years of education: Not on file  . Highest education level: Not on file  Social Needs  . Financial resource strain: Not on file  . Food insecurity - worry: Not on file  . Food insecurity - inability: Not on file  . Transportation needs - medical: Not on file  . Transportation needs - non-medical: Not on file  Occupational History  . Not on file  Tobacco  Use  . Smoking status: Former Smoker    Types: Cigarettes    Last attempt to quit: 09/09/1988    Years since quitting: 28.8  . Smokeless tobacco: Never Used  . Tobacco comment: quit 25 years ago  Substance and Sexual Activity  . Alcohol use: No    Alcohol/week: 0.0 oz  . Drug use: No  . Sexual activity: Not Currently    Birth control/protection: Post-menopausal  Other Topics Concern  . Not on file  Social History Narrative  . Not on file    FAMILY HISTORY: Family History  Problem Relation Age of Onset  . Congestive Heart Failure Mother   . Diabetes Mother   . Coronary artery disease Mother   . Stroke  Mother   . Cirrhosis Father     ALLERGIES:  is allergic to macrobid [nitrofurantoin monohyd macro].  MEDICATIONS:  Current Facility-Administered Medications  Medication Dose Route Frequency Provider Last Rate Last Dose  . albuterol (PROVENTIL) (2.5 MG/3ML) 0.083% nebulizer solution 2.5 mg  2.5 mg Inhalation Q4H PRN Gouru, Aruna, MD      . buPROPion (WELLBUTRIN XL) 24 hr tablet 300 mg  300 mg Oral Q1200 Gouru, Aruna, MD   300 mg at 07/18/17 1125  . clonazePAM (KLONOPIN) tablet 0.5 mg  0.5 mg Oral BID Gouru, Aruna, MD   0.5 mg at 07/18/17 0835  . dexamethasone (DECADRON) injection 10 mg  10 mg Intravenous Q12H Cammie Sickle, MD   10 mg at 07/18/17 0836  . diltiazem (CARDIZEM CD) 24 hr capsule 180 mg  180 mg Oral Daily Gouru, Aruna, MD   180 mg at 07/18/17 0836  . heparin lock flush 100 unit/mL  500 Units Intracatheter Daily PRN Charlaine Dalton R, MD      . heparin lock flush 100 unit/mL  250 Units Intracatheter PRN Charlaine Dalton R, MD      . insulin aspart (novoLOG) injection 0-5 Units  0-5 Units Subcutaneous QHS Gouru, Aruna, MD      . insulin aspart (novoLOG) injection 0-9 Units  0-9 Units Subcutaneous TID WC Nicholes Mango, MD   2 Units at 07/18/17 1202  . midodrine (PROAMATINE) tablet 10 mg  10 mg Oral TID WC Dustin Flock, MD   10 mg at 07/18/17 1526  . morphine 2 MG/ML injection 2 mg  2 mg Intravenous Q4H PRN Gouru, Aruna, MD   2 mg at 07/18/17 0226  . ondansetron (ZOFRAN) tablet 4 mg  4 mg Oral Q6H PRN Gouru, Aruna, MD       Or  . ondansetron (ZOFRAN) injection 4 mg  4 mg Intravenous Q6H PRN Gouru, Aruna, MD   4 mg at 07/18/17 0845  . ondansetron (ZOFRAN-ODT) disintegrating tablet 4 mg  4 mg Oral Q8H PRN Gouru, Aruna, MD      . polyethylene glycol (MIRALAX / GLYCOLAX) packet 17 g  17 g Oral Daily PRN Gouru, Aruna, MD      . sertraline (ZOLOFT) tablet 100 mg  100 mg Oral Q1200 Gouru, Aruna, MD   100 mg at 07/18/17 1125  . sodium chloride flush (NS) 0.9 % injection 10  mL  10 mL Intracatheter PRN Charlaine Dalton R, MD      . sodium chloride flush (NS) 0.9 % injection 3 mL  3 mL Intracatheter PRN Cammie Sickle, MD          .  PHYSICAL EXAMINATION:  Vitals:   07/18/17 1405 07/18/17 1434  BP: (!) 114/39 (!) 114/44  Pulse:  80 75  Resp:  20  Temp: 98.2 F (36.8 C) 97.8 F (36.6 C)  SpO2: 97% 97%   Filed Weights   07/17/17 1228 07/17/17 1858  Weight: 244 lb (110.7 kg) 237 lb 14.4 oz (107.9 kg)    GENERAL: Well-nourished well-developed; Alert, mild distress because of the left upper quadrant pain. Obese. Alone. EYES: Positive for pallor. OROPHARYNX: no thrush or ulceration. Bleeding noted from the mucosa. NECK: supple, no masses felt LYMPH:  no palpable lymphadenopathy in the cervical, axillary or inguinal regions LUNGS: decreased breath sounds to auscultation at bases and  No wheeze or crackles HEART/CVS: regular rate & rhythm and no murmurs; No lower extremity edema ABDOMEN: abdomen soft, non-tender and normal bowel sounds; enlarged spleen; tender on palpation. Musculoskeletal:no cyanosis of digits and no clubbing  PSYCH: alert & oriented x 3 with fluent speech NEURO: no focal motor/sensory deficits SKIN:  no rashes or significant lesions; except for multiple bruises.  LABORATORY DATA:  I have reviewed the data as listed Lab Results  Component Value Date   WBC 4.2 07/18/2017   HGB 7.5 (L) 07/18/2017   HCT 22.4 (L) 07/18/2017   MCV 93.7 07/18/2017   PLT 15 (LL) 07/18/2017   Recent Labs    06/02/17 0828 06/23/17 0955 07/17/17 1229 07/18/17 0415  NA 140 140 137 137  K 4.3 3.6 3.6 3.3*  CL 106 104 103 104  CO2 '26 25 24 25  '$ GLUCOSE 117* 145* 114* 107*  BUN 22* '17 16 11  '$ CREATININE 1.26* 1.13* 1.37* 1.40*  CALCIUM 9.5 9.1 9.2 8.5*  GFRNONAA 41* 46* 37* 36*  GFRAA 47* 54* 43* 41*  PROT 7.5  --  8.0 7.0  ALBUMIN 3.8  --  3.6 3.1*  AST 21  --  29 492*  ALT 20  --  19 269*  ALKPHOS 84  --  77 220*  BILITOT 0.5  --   1.2 1.8*  BILIDIR  --   --  0.1  --   IBILI  --   --  1.1*  --     RADIOGRAPHIC STUDIES: I have personally reviewed the radiological images as listed and agreed with the findings in the report. Dg Chest 2 View  Result Date: 07/17/2017 CLINICAL DATA:  Chest pain.  Ongoing chemotherapy for leukemia. EXAM: CHEST  2 VIEW COMPARISON:  03/27/2017 FINDINGS: Lungs are adequately inflated without focal airspace consolidation or effusion. Subtle stable prominence of the interstitial markings. Cardiomediastinal silhouette and remainder of the exam is unchanged. IMPRESSION: No acute cardiopulmonary disease. Subtle stable interstitial prominence. Electronically Signed   By: Marin Olp M.D.   On: 07/17/2017 13:05   Ct Abdomen Pelvis W Contrast  Result Date: 07/17/2017 CLINICAL DATA:  Left-sided chest pain beginning yesterday with dyspnea on exertion. Abdominal pain not specified. EXAM: CT ABDOMEN AND PELVIS WITH CONTRAST TECHNIQUE: Multidetector CT imaging of the abdomen and pelvis was performed using the standard protocol following bolus administration of intravenous contrast. CONTRAST:  66m ISOVUE-300 IOPAMIDOL (ISOVUE-300) INJECTION 61% COMPARISON:  CT from 05/07/2016 and 12/08/2005 FINDINGS: Lower chest: Stable mild cardiomegaly without pericardial effusion. Scarring in the left lower lobe. Hepatobiliary: Punctate calcifications of the liver compatible with old granulomatous disease. Prior cholecystectomy. No biliary dilatation. Pancreas: Unremarkable. No pancreatic ductal dilatation or surrounding inflammatory changes. Spleen: Splenomegaly with the spleen measuring 15.9 x 14.8 x 5.8 cm (volume = 710 cm^3). Splenic granulomas are noted. Adrenals/Urinary Tract: Normal bilateral adrenal glands. Water attenuating cysts are re- demonstrated within both kidneys,  the largest on the left up to 13 mm and on the right 14 mm. Nonobstructing calcifications are seen within the interpolar and lower aspect of the right  kidney measuring up to 4 mm. No hydroureteronephrosis. The urinary bladder is unremarkable. Stomach/Bowel: Physiologic distention of the stomach with enteric contrast. No bowel obstruction is noted. Status post appendectomy. Moderate fecal residue is noted from cecum to splenic flexure. No acute bowel inflammation. Vascular/Lymphatic: Aortoiliac atherosclerosis. There is a 16 mm short axis lymph node just below the gastroesophageal junction, increased in size from 10 mm previously. Small subcentimeter mesenteric lymph nodes are noted. Small retroperitoneal lymph nodes are also present without significant change. Small splenules are present in the left upper quadrant versus small lymph nodes. Reproductive: Status post hysterectomy. No adnexal masses. Other: Re- demonstration of nonspecific anterior left upper quadrant subcutaneous soft tissue nodule measuring approximately 2.5 x 2.1 x 1.7 cm, unchanged in appearance since recent comparison but dates back to 2007 likely representing a nonaggressive finding. Musculoskeletal: Small left iliac focus of sclerosis likely to represent a bone island. Mild disc space narrowing T12 through L4 with slight retrolisthesis grade 1 of L3 on L4. No aggressive appearing osseous abnormality. IMPRESSION: 1. There has been an interval increase in size of the spleen consistent splenomegaly since prior with the spleen now measuring 15.9 x 14.8 x 5.8 cm, previously 12.9 x 4.4 x 11.6 cm. 2. Interval increase in size of a epigastric lymph node measuring 16 mm in short axis just below the GE junction versus 10 mm previously. 3. Stable cardiomegaly. 4. Hepatic granulomas.  Prior cholecystectomy. 5. Water attenuating cysts appear stable within both kidneys with nonobstructing right-sided renal calculi currently noted. 6. Stable left anterior upper abdominal wall subcutaneous nodule dating back to 2007, though increased in size from 2007. Stable since recent comparison however, currently 2.5 x  2.1 x 1.7 cm. 7. Lumbar spondylosis with facet arthropathy. Electronically Signed   By: Ashley Royalty M.D.   On: 07/17/2017 16:29    ASSESSMENT & PLAN:   # 76 yo female patient acute myeloid leukemia-currently on ID H-2 inhibitor is currently admitted to the hospital for abdominal pain.  # Abdominal pain- fairly acute onset. CT scan shows splenomegaly question infarction versus others [differentiation syndrome- which is a known adverse effect of Easidenib-clinically less likley- as differentiation syndrome manifested as leukocytosis; pleural effusions; significant weight gain/fluid retention; worsening renal dysfunction etc. ]. However recommend holding Enasidenib- given the unclear clinical picture [especially given elevated LFTs]- start dexamethasone 10 mg IV twice a day.   # Elevated LFTs-AST ALT out of proportion to bilirubin [again LESS likely- secondary to differentiation syndrome]. Discontinue acyclovir/Zocor.   # Acute myeloid leukemia with -Severe thrombocytopenia-platelet count 15/severe anemia hemoglobin 7.5- proceed with platelet transfusion and PRBC transfusion. Clinically, I'm suspicious of progressive disease. I discussed my concerns with the patient in detail.   # I discussed with Dr. Royce Macadamia, at [facility] regarding the clinical status. He agrees with the plan; however kindly agreed to accept/transfer patient to [facility]. Patient agrees to be transferred to [facility]. I discussed with Dr. Posey Pronto- he kindly initiated the transfer process. All questions were answered.   # Thank you Dr.Patel for allowing me to participate in the care of your pleasant patient. Please do not hesitate to contact me with questions or concerns in the interim.    Cammie Sickle, MD 07/18/2017 3:53 PM  Addendum: discussed the plan of care with pt/family [sister/husband; sister-in-law]. Discussed re: code status. Pt interested  in DNR/DNI. Will change the code status to DNR/DNI.

## 2017-07-18 NOTE — Discharge Summary (Signed)
[facility] [facility] at [facility], 76 y.o., DOB 09-28-1940, MRN [id number]. Admission date: 07/17/2017 Discharge Date 07/18/2017 Primary MD Roselee Nova, MD Admitting Physician Nicholes Mango, MD  Admission Diagnosis  Splenomegaly [R16.1] Thrombocytopenia ([place]) [D69.6] Chronic anemia [D64.9]  Discharge Diagnosis   Active Problems:  Acute abdominal pain due to splenomegaly  AML with worsening Anemia due to AML Severe thrombocytopenia due to AML Elevated LFTs likely related to AML Acute renal failure due to dehydration DM2 Morbid obesity Essential hypertension GERD Depression        Hospital Course  HISTORY OF PRESENT ILLNESS:  Teresa Carrillo  is a 76 y.o. female with a known history of AML on chemotherapy, diabetes mellitus, hypertension, hyperlipidemia, COPD and multiple other medical problems is presenting to the ED with a chief complaint of acute left-sided abdominal pain radiating to the left chest and shoulder.  CT abdomen has revealed splenomegaly.  Patient also noticed to have worsening anemia and thrombocytopenia.  She was seen by oncology who felt that she had worsening of her AML and recommended transfusion of platelets and hemoglobin.  They also discussed the case with patient's [facility] hematologist Dr. Gloriann Loan who recommended patient transfer to [facility].  Arrangements have been made for her transfer.  Further treatment and therapy per them.  She was started on Decadron 10 mg IV every 12 here.  She will also receive packed RBCs and platelets.                Consults  hematology/oncology  Significant Tests:  See full reports for all details    Dg Chest 2 View  Result Date: 07/17/2017 CLINICAL DATA:  Chest pain.  Ongoing chemotherapy for leukemia. EXAM: CHEST  2 VIEW COMPARISON:  03/27/2017 FINDINGS: Lungs are adequately inflated without focal airspace consolidation or effusion. Subtle stable prominence of the interstitial  markings. Cardiomediastinal silhouette and remainder of the exam is unchanged. IMPRESSION: No acute cardiopulmonary disease. Subtle stable interstitial prominence. Electronically Signed   By: Marin Olp M.D.   On: 07/17/2017 13:05   Ct Abdomen Pelvis W Contrast  Result Date: 07/17/2017 CLINICAL DATA:  Left-sided chest pain beginning yesterday with dyspnea on exertion. Abdominal pain not specified. EXAM: CT ABDOMEN AND PELVIS WITH CONTRAST TECHNIQUE: Multidetector CT imaging of the abdomen and pelvis was performed using the standard protocol following bolus administration of intravenous contrast. CONTRAST:  63mL ISOVUE-300 IOPAMIDOL (ISOVUE-300) INJECTION 61% COMPARISON:  CT from 05/07/2016 and 12/08/2005 FINDINGS: Lower chest: Stable mild cardiomegaly without pericardial effusion. Scarring in the left lower lobe. Hepatobiliary: Punctate calcifications of the liver compatible with old granulomatous disease. Prior cholecystectomy. No biliary dilatation. Pancreas: Unremarkable. No pancreatic ductal dilatation or surrounding inflammatory changes. Spleen: Splenomegaly with the spleen measuring 15.9 x 14.8 x 5.8 cm (volume = 710 cm^3). Splenic granulomas are noted. Adrenals/Urinary Tract: Normal bilateral adrenal glands. Water attenuating cysts are re- demonstrated within both kidneys, the largest on the left up to 13 mm and on the right 14 mm. Nonobstructing calcifications are seen within the interpolar and lower aspect of the right kidney measuring up to 4 mm. No hydroureteronephrosis. The urinary bladder is unremarkable. Stomach/Bowel: Physiologic distention of the stomach with enteric contrast. No bowel obstruction is noted. Status post appendectomy. Moderate fecal residue is noted from cecum to splenic flexure. No acute bowel inflammation. Vascular/Lymphatic: Aortoiliac atherosclerosis. There is a 16 mm short axis lymph node just below the gastroesophageal junction, increased in size from 10 mm previously.  Small subcentimeter mesenteric lymph nodes are noted. Small retroperitoneal lymph nodes are also present without significant change. Small splenules are present in the left upper quadrant versus small lymph nodes. Reproductive: Status post hysterectomy. No adnexal masses. Other: Re- demonstration of nonspecific anterior left upper quadrant subcutaneous soft tissue nodule measuring approximately 2.5 x 2.1 x 1.7 cm, unchanged in appearance since recent comparison but dates back to 2007 likely representing a nonaggressive finding. Musculoskeletal: Small left iliac focus of sclerosis likely to represent a bone island. Mild disc space narrowing T12 through L4 with slight retrolisthesis grade 1 of L3 on L4. No aggressive appearing osseous abnormality. IMPRESSION: 1. There has been an interval increase in size of the spleen consistent splenomegaly since prior with the spleen now measuring 15.9 x 14.8 x 5.8 cm, previously 12.9 x 4.4 x 11.6 cm. 2. Interval increase in size of a epigastric lymph node measuring 16 mm in short axis just below the GE junction versus 10 mm previously. 3. Stable cardiomegaly. 4. Hepatic granulomas.  Prior cholecystectomy. 5. Water attenuating cysts appear stable within both kidneys with nonobstructing right-sided renal calculi currently noted. 6. Stable left anterior upper abdominal wall subcutaneous nodule dating back to 2007, though increased in size from 2007. Stable since recent comparison however, currently 2.5 x 2.1 x 1.7 cm. 7. Lumbar spondylosis with facet arthropathy. Electronically Signed   By: Ashley Royalty M.D.   On: 07/17/2017 16:29       Today   Subjective:   Teresa Carrillo patient continues to have complaint of abdominal pain. Objective:   Blood pressure (!) 104/50, pulse 87, temperature 98.4 F (36.9 C), temperature source Oral, resp. rate (!) 24, height 5\' 4"  (1.626 m), weight 237 lb 14.4 oz (107.9 kg), SpO2 98 %.  . No intake or output data in the 24 hours ending  07/18/17 1009  Exam VITAL SIGNS: Blood pressure (!) 104/50, pulse 87, temperature 98.4 F (36.9 C), temperature source Oral, resp. rate (!) 24, height 5\' 4"  (1.626 m), weight 237 lb 14.4 oz (107.9 kg), SpO2 98 %.  GENERAL:  76 y.o.-year-old patient lying in the bed with no acute distress.  EYES: Pupils equal, round, reactive to light and accommodation. No scleral icterus. Extraocular muscles intact.  HEENT: Head atraumatic, normocephalic. Oropharynx and nasopharynx clear.  NECK:  Supple, no jugular venous distention. No thyroid enlargement, no tenderness.  LUNGS: Normal breath sounds bilaterally, no wheezing, rales,rhonchi or crepitation. No use of accessory muscles of respiration.  CARDIOVASCULAR: S1, S2 normal. No murmurs, rubs, or gallops.  ABDOMEN: Soft, nontender, mildly distended. Bowel sounds present. No organomegaly or mass.  EXTREMITIES: No pedal edema, cyanosis, or clubbing.  NEUROLOGIC: Cranial nerves II through XII are intact. Muscle strength 5/5 in all extremities. Sensation intact. Gait not checked.  PSYCHIATRIC: The patient is alert and oriented x 3.  SKIN: No obvious rash, lesion, or ulcer.   Data Review     CBC w Diff:  Lab Results  Component Value Date   WBC 4.2 07/18/2017   HGB 7.5 (L) 07/18/2017   HGB 8.3 (L) 02/21/2016   HCT 22.4 (L) 07/18/2017   HCT 26.2 (L) 02/21/2016   PLT 15 (LL) 07/18/2017   PLT CANCELED 02/21/2016   LYMPHOPCT 24 07/17/2017   LYMPHOPCT 22.6 11/10/2012   BANDSPCT 2 07/17/2017   MONOPCT 16 07/17/2017   MONOPCT 8.6 11/10/2012   EOSPCT 0 07/17/2017   EOSPCT 1.8 11/10/2012   BASOPCT 0 07/17/2017   BASOPCT 0.7 11/10/2012   CMP:  Lab Results  Component Value Date   NA 137 07/18/2017   NA 145 (H) 05/05/2015   NA 143 11/10/2012   K 3.3 (L) 07/18/2017   K 3.1 (L) 11/10/2012   CL 104 07/18/2017   CL 111 (H) 11/10/2012   CO2 25 07/18/2017   CO2 29 11/10/2012   BUN 11 07/18/2017   BUN 20 05/05/2015   BUN 14 11/10/2012   CREATININE  1.40 (H) 07/18/2017   CREATININE 1.20 (H) 01/23/2017   PROT 7.0 07/18/2017   PROT 7.0 05/05/2015   ALBUMIN 3.1 (L) 07/18/2017   ALBUMIN 4.2 05/05/2015   BILITOT 1.8 (H) 07/18/2017   BILITOT 0.3 05/05/2015   ALKPHOS 220 (H) 07/18/2017   AST 492 (H) 07/18/2017   ALT 269 (H) 07/18/2017  .  Micro Results No results found for this or any previous visit (from the past 240 hour(s)).      Code Status Orders  (From admission, onward)        Start     Ordered   07/17/17 1857  Full code  Continuous     07/17/17 1856    Code Status History    Date Active Date Inactive Code Status Order ID Comments User Context   06/19/2016 17:33 06/20/2016 14:11 Full Code [id number]  Henreitta Leber, MD Inpatient   05/07/2016 23:27 05/10/2016 14:34 Full Code [id number]  Lance Coon, MD Inpatient    Advance Directive Documentation     Most Recent Value  Type of Advance Directive  Living will  Pre-existing out of facility DNR order (yellow form or pink MOST form)  No data  "MOST" Form in Place?  No data            Discharge Medications   Allergies as of 07/18/2017      Reactions   Macrobid [nitrofurantoin Monohyd Macro] Other (See Comments)   Reaction: unknown      Medication List    STOP taking these medications   Enasidenib Mesylate 100 MG Tabs   levofloxacin 500 MG tablet Commonly known as:  LEVAQUIN   metFORMIN 500 MG tablet Commonly known as:  GLUCOPHAGE     TAKE these medications   acyclovir 400 MG tablet Commonly known as:  ZOVIRAX TAKE 1 TABLET(400 MG) BY MOUTH TWICE DAILY   buPROPion 150 MG 24 hr tablet Commonly known as:  WELLBUTRIN XL Take 300 mg by mouth daily at 12 noon.   clonazePAM 0.5 MG tablet Commonly known as:  KLONOPIN Take 0.5 mg by mouth 2 (two) times daily.   diltiazem 180 MG 24 hr capsule Commonly known as:  CARDIZEM CD Take 1 capsule (180 mg total) by mouth daily.   hydrocortisone 2.5 % rectal cream Commonly known as:  ANUSOL-HC Place 1  application rectally 2 (two) times daily as needed for hemorrhoids or itching.   magic mouthwash w/lidocaine Soln Take 5 mLs by mouth 4 (four) times daily. 80 ml viscous lidocaine 2%, 80 ml Mylanta, 80 ml Diphenhydramine 12.5 mg/5 ml Elixir, 80 ml Nystatin 100,000 Unit suspension, 80 ml Prednisolone 15 mg/56ml, 80 ml Distilled Water.  Sig: Swish/Swallow 5-10 ml four times a day as needed. Dispense 480 ml. 3RFs   ondansetron 4 MG disintegrating tablet Commonly known as:  ZOFRAN-ODT Take 4 mg by mouth every 8 (eight) hours as needed for nausea or vomiting.   polyethylene glycol packet Commonly known as:  MIRALAX / GLYCOLAX Take 17 g by mouth daily as needed for mild constipation.   prochlorperazine 10  MG tablet Commonly known as:  COMPAZINE Take 1 tablet (10 mg total) by mouth every 6 (six) hours as needed (Nausea or vomiting).   PROVENTIL HFA 108 (90 Base) MCG/ACT inhaler Generic drug:  albuterol Inhale into the lungs.   rosuvastatin 20 MG tablet Commonly known as:  CRESTOR TAKE 1 TABLET(20 MG) BY MOUTH AT BEDTIME   sertraline 100 MG tablet Commonly known as:  ZOLOFT Take 100 mg by mouth daily at 12 noon.          Total Time in preparing paper work, data evaluation and todays exam - 35 minutes  Dustin Flock M.D on 07/18/2017 at 10:09 AM  [facility]   Office  [phone]

## 2017-07-18 NOTE — Care Management Obs Status (Signed)
[facility] NOTIFICATION   Patient Details  Name: HALIMAH BEWICK MRN: [id number] Date of Birth: 17-Mar-1941   Medicare Observation Status Notification Given:  Yes    Shelbie Ammons, RN 07/18/2017, 9:33 AM

## 2017-07-18 NOTE — Progress Notes (Signed)
Palliative Medicine Team  Due to high volume of referrals, there is a delay seeing this patient. PMT not at [facility] over the weekend but will arrange goals of care with patient and family on Monday. Thank you for the opportunity to participate in the care of Ms. Pagliarulo. Recommend outpatient palliative referral if discharged over the weekend.   NO CHARGE  Ihor Dow, FNP-C Palliative Medicine Team  Phone: [phone] Fax: [phone]

## 2017-07-18 NOTE — Care Management Note (Signed)
Case Management Note  Patient Details  Name: Teresa Carrillo MRN: [id number] Date of Birth: 30-Dec-1940  Subjective/Objective:  Admitted to [facility] under observation status with the diagnosis of acute abdominal pain, Lives with husband, Jenny Reichmann [phone]). Last seen Dr. Manuella Ghazi 06/17/17. Goes to [facility] for treatment for leukemia. Prescriptions are filled at [facility] in [facility]. No home health. No skilled nursing. No home oxygen. No medical equipment in the home. Takes care of all basic activities of daily living herself, doesn't drive. No falls. Decreased appetite. Lost 20 pounds, Husband will transport.                   Action/Plan: Possible transfer to [facility] or [facility]   Expected Discharge Date:                  Expected Discharge Plan:     In-House Referral:     Discharge planning Services     Post Acute Care Choice:    Choice offered to:     DME Arranged:    DME Agency:     HH Arranged:    HH Agency:     Status of Service:     If discussed at [employer] of Stay Meetings, dates discussed:    Additional Comments:  Shelbie Ammons, RN MSN CCM Care Management [phone] 07/18/2017, 9:33 AM

## 2017-07-18 NOTE — Progress Notes (Signed)
Dr Posey Pronto made aware verbally in person that pts BP 98/38, pt currently receiving platelets, will be receiving blood soon, MD states to continue with transfusions and that he will order midodrine

## 2017-07-19 ENCOUNTER — Inpatient Hospital Stay
Admission: EM | Admit: 2017-07-19 | Discharge: 2017-07-24 | Disposition: A | Discharge status: Home / Self Care | Payer: MEDICARE | Source: Intra-hospital

## 2017-07-19 ENCOUNTER — Inpatient Hospital Stay
Admission: EM | Admit: 2017-07-19 | Discharge: 2017-07-24 | Disposition: A | Discharge status: Home / Self Care | Payer: MEDICARE | Source: Intra-hospital | Attending: Hematology | Admitting: Hematology

## 2017-07-19 DIAGNOSIS — C9200 Acute myeloblastic leukemia, not having achieved remission: Principal | ICD-10-CM

## 2017-07-19 DIAGNOSIS — H9190 Unspecified hearing loss, unspecified ear: Secondary | ICD-10-CM | POA: Diagnosis not present

## 2017-07-19 DIAGNOSIS — I480 Paroxysmal atrial fibrillation: Secondary | ICD-10-CM | POA: Diagnosis not present

## 2017-07-19 DIAGNOSIS — E860 Dehydration: Secondary | ICD-10-CM | POA: Diagnosis not present

## 2017-07-19 DIAGNOSIS — Z7984 Long term (current) use of oral hypoglycemic drugs: Secondary | ICD-10-CM | POA: Diagnosis not present

## 2017-07-19 DIAGNOSIS — Z79891 Long term (current) use of opiate analgesic: Secondary | ICD-10-CM | POA: Diagnosis not present

## 2017-07-19 DIAGNOSIS — Z856 Personal history of leukemia: Secondary | ICD-10-CM | POA: Diagnosis not present

## 2017-07-19 DIAGNOSIS — K219 Gastro-esophageal reflux disease without esophagitis: Secondary | ICD-10-CM | POA: Diagnosis not present

## 2017-07-19 DIAGNOSIS — N6019 Diffuse cystic mastopathy of unspecified breast: Secondary | ICD-10-CM | POA: Diagnosis not present

## 2017-07-19 DIAGNOSIS — I129 Hypertensive chronic kidney disease with stage 1 through stage 4 chronic kidney disease, or unspecified chronic kidney disease: Secondary | ICD-10-CM | POA: Diagnosis not present

## 2017-07-19 DIAGNOSIS — Z87442 Personal history of urinary calculi: Secondary | ICD-10-CM | POA: Diagnosis not present

## 2017-07-19 DIAGNOSIS — R011 Cardiac murmur, unspecified: Secondary | ICD-10-CM | POA: Diagnosis not present

## 2017-07-19 DIAGNOSIS — R0600 Dyspnea, unspecified: Secondary | ICD-10-CM | POA: Diagnosis not present

## 2017-07-19 DIAGNOSIS — G4730 Sleep apnea, unspecified: Secondary | ICD-10-CM | POA: Diagnosis not present

## 2017-07-19 DIAGNOSIS — E785 Hyperlipidemia, unspecified: Secondary | ICD-10-CM | POA: Diagnosis not present

## 2017-07-19 DIAGNOSIS — M25512 Pain in left shoulder: Secondary | ICD-10-CM | POA: Diagnosis not present

## 2017-07-19 DIAGNOSIS — N179 Acute kidney failure, unspecified: Secondary | ICD-10-CM | POA: Diagnosis not present

## 2017-07-19 DIAGNOSIS — D61818 Other pancytopenia: Secondary | ICD-10-CM | POA: Diagnosis not present

## 2017-07-19 DIAGNOSIS — C9202 Acute myeloblastic leukemia, in relapse: Secondary | ICD-10-CM | POA: Diagnosis not present

## 2017-07-19 DIAGNOSIS — Z9989 Dependence on other enabling machines and devices: Secondary | ICD-10-CM | POA: Diagnosis not present

## 2017-07-19 DIAGNOSIS — R1012 Left upper quadrant pain: Secondary | ICD-10-CM | POA: Diagnosis not present

## 2017-07-19 DIAGNOSIS — D696 Thrombocytopenia, unspecified: Secondary | ICD-10-CM | POA: Diagnosis not present

## 2017-07-19 DIAGNOSIS — E1122 Type 2 diabetes mellitus with diabetic chronic kidney disease: Secondary | ICD-10-CM | POA: Diagnosis not present

## 2017-07-19 DIAGNOSIS — Z9049 Acquired absence of other specified parts of digestive tract: Secondary | ICD-10-CM | POA: Diagnosis not present

## 2017-07-19 DIAGNOSIS — R7989 Other specified abnormal findings of blood chemistry: Secondary | ICD-10-CM | POA: Diagnosis not present

## 2017-07-19 DIAGNOSIS — J441 Chronic obstructive pulmonary disease with (acute) exacerbation: Secondary | ICD-10-CM | POA: Diagnosis not present

## 2017-07-19 DIAGNOSIS — R161 Splenomegaly, not elsewhere classified: Secondary | ICD-10-CM | POA: Diagnosis not present

## 2017-07-19 DIAGNOSIS — R042 Hemoptysis: Secondary | ICD-10-CM | POA: Diagnosis not present

## 2017-07-19 DIAGNOSIS — M1990 Unspecified osteoarthritis, unspecified site: Secondary | ICD-10-CM | POA: Diagnosis not present

## 2017-07-19 DIAGNOSIS — E1121 Type 2 diabetes mellitus with diabetic nephropathy: Secondary | ICD-10-CM | POA: Diagnosis not present

## 2017-07-19 DIAGNOSIS — R740 Nonspecific elevation of levels of transaminase and lactic acid dehydrogenase [LDH]: Secondary | ICD-10-CM | POA: Diagnosis not present

## 2017-07-19 DIAGNOSIS — N189 Chronic kidney disease, unspecified: Secondary | ICD-10-CM | POA: Diagnosis not present

## 2017-07-19 DIAGNOSIS — I10 Essential (primary) hypertension: Secondary | ICD-10-CM | POA: Diagnosis not present

## 2017-07-19 DIAGNOSIS — R1032 Left lower quadrant pain: Secondary | ICD-10-CM | POA: Diagnosis not present

## 2017-07-19 DIAGNOSIS — Z9221 Personal history of antineoplastic chemotherapy: Secondary | ICD-10-CM | POA: Diagnosis not present

## 2017-07-19 DIAGNOSIS — Z9911 Dependence on respirator [ventilator] status: Secondary | ICD-10-CM | POA: Diagnosis not present

## 2017-07-19 DIAGNOSIS — R0789 Other chest pain: Secondary | ICD-10-CM | POA: Diagnosis not present

## 2017-07-19 DIAGNOSIS — I2510 Atherosclerotic heart disease of native coronary artery without angina pectoris: Secondary | ICD-10-CM | POA: Diagnosis not present

## 2017-07-19 DIAGNOSIS — Z87891 Personal history of nicotine dependence: Secondary | ICD-10-CM | POA: Diagnosis not present

## 2017-07-19 DIAGNOSIS — J449 Chronic obstructive pulmonary disease, unspecified: Secondary | ICD-10-CM | POA: Diagnosis not present

## 2017-07-19 LAB — BPAM RBC
BLOOD PRODUCT EXPIRATION DATE: 201811302359
ISSUE DATE / TIME: 201811091126
UNIT TYPE AND RH: 6200

## 2017-07-19 LAB — TYPE AND SCREEN
ABO/RH(D): A POS
Antibody Screen: NEGATIVE
Unit division: 0

## 2017-07-19 LAB — GLUCOSE, CAPILLARY
Glucose-Capillary: 190 mg/dL — ABNORMAL HIGH (ref 65–99)
Glucose-Capillary: 205 mg/dL — ABNORMAL HIGH (ref 65–99)

## 2017-07-19 LAB — PREPARE PLATELET PHERESIS: Unit division: 0

## 2017-07-19 LAB — BPAM PLATELET PHERESIS
BLOOD PRODUCT EXPIRATION DATE: 201811122359
ISSUE DATE / TIME: 201811091021
Unit Type and Rh: 600

## 2017-07-19 LAB — BLOOD GAS CRITICAL CARE PANEL, VENOUS
BASE EXCESS VENOUS: 1.8 (ref -2.0–2.0)
CALCIUM IONIZED VENOUS (MG/DL): 4.41 mg/dL (ref 4.40–5.40)
GLUCOSE WHOLE BLOOD: 307 mg/dL
HCO3 VENOUS: 26 mmol/L (ref 22–27)
LACTATE BLOOD VENOUS: 1.8 mmol/L (ref 0.5–1.8)
O2 SATURATION VENOUS: 61.2 % (ref 40.0–85.0)
PO2 VENOUS: 36 mmHg (ref 30–55)
POTASSIUM WHOLE BLOOD: 4.7 mmol/L — ABNORMAL HIGH (ref 3.4–4.6)
SODIUM WHOLE BLOOD: 134 mmol/L — ABNORMAL LOW (ref 135–145)

## 2017-07-19 LAB — PRO-BNP: Natriuretic peptide.B prohormone N-Terminal:MCnc:Pt:Ser/Plas:Qn:: 1700 — ABNORMAL HIGH

## 2017-07-19 LAB — CBC W/ AUTO DIFF
HEMATOCRIT: 25.9 % — ABNORMAL LOW (ref 36.0–46.0)
HEMOGLOBIN: 8.6 g/dL — ABNORMAL LOW (ref 12.0–16.0)
MEAN CORPUSCULAR HEMOGLOBIN CONC: 33.2 g/dL (ref 31.0–37.0)
MEAN CORPUSCULAR HEMOGLOBIN: 30.3 pg (ref 26.0–34.0)
MEAN CORPUSCULAR VOLUME: 91.3 fL (ref 80.0–100.0)
MEAN PLATELET VOLUME: 10 fL (ref 7.0–10.0)
NUCLEATED RED BLOOD CELLS: 1 /100{WBCs} (ref <=4)
PLATELET COUNT: 37 10*9/L — ABNORMAL LOW (ref 150–440)
RED BLOOD CELL COUNT: 2.83 10*12/L — ABNORMAL LOW (ref 4.00–5.20)
RED CELL DISTRIBUTION WIDTH: 20.4 % — ABNORMAL HIGH (ref 12.0–15.0)
WBC ADJUSTED: 4.2 10*9/L — ABNORMAL LOW (ref 4.5–11.0)

## 2017-07-19 LAB — INR
Lab: 1.09
Lab: 1.14

## 2017-07-19 LAB — MAGNESIUM
Magnesium:MCnc:Pt:Ser/Plas:Qn:: 1.9
Magnesium:MCnc:Pt:Ser/Plas:Qn:: 2

## 2017-07-19 LAB — ANTITHROMB III, FUNC: Lab: 114

## 2017-07-19 LAB — BASIC METABOLIC PANEL
BLOOD UREA NITROGEN: 31 mg/dL — ABNORMAL HIGH (ref 7–21)
BUN / CREAT RATIO: 25
CALCIUM: 8.5 mg/dL (ref 8.5–10.2)
CHLORIDE: 96 mmol/L — ABNORMAL LOW (ref 98–107)
CO2: 28 mmol/L (ref 22.0–30.0)
CREATININE: 1.22 mg/dL — ABNORMAL HIGH (ref 0.60–1.00)
EGFR MDRD AF AMER: 52 mL/min/{1.73_m2} — ABNORMAL LOW (ref >=60)
EGFR MDRD NON AF AMER: 43 mL/min/{1.73_m2} — ABNORMAL LOW (ref >=60)
GLUCOSE RANDOM: 262 mg/dL — ABNORMAL HIGH (ref 65–179)
POTASSIUM: 5.1 mmol/L — ABNORMAL HIGH (ref 3.5–5.0)
SODIUM: 135 mmol/L (ref 135–145)

## 2017-07-19 LAB — COMPREHENSIVE METABOLIC PANEL
ALBUMIN: 3.6 g/dL (ref 3.5–5.0)
ALKALINE PHOSPHATASE: 202 U/L — ABNORMAL HIGH (ref 38–126)
ALT (SGPT): 186 U/L — ABNORMAL HIGH (ref 15–48)
ANION GAP: 11 mmol/L (ref 9–15)
AST (SGOT): 102 U/L — ABNORMAL HIGH (ref 14–38)
BILIRUBIN TOTAL: 0.9 mg/dL (ref 0.0–1.2)
BUN / CREAT RATIO: 25
CALCIUM: 8.6 mg/dL (ref 8.5–10.2)
CHLORIDE: 99 mmol/L (ref 98–107)
CO2: 25 mmol/L (ref 22.0–30.0)
CREATININE: 1.15 mg/dL — ABNORMAL HIGH (ref 0.60–1.00)
EGFR MDRD AF AMER: 56 mL/min/{1.73_m2} — ABNORMAL LOW (ref >=60)
EGFR MDRD NON AF AMER: 46 mL/min/{1.73_m2} — ABNORMAL LOW (ref >=60)
GLUCOSE RANDOM: 220 mg/dL — ABNORMAL HIGH (ref 65–179)
POTASSIUM: 5.1 mmol/L — ABNORMAL HIGH (ref 3.5–5.0)
PROTEIN TOTAL: 7 g/dL (ref 6.5–8.3)

## 2017-07-19 LAB — LACTATE DEHYDROGENASE
Lactate dehydrogenase:CCnc:Pt:Ser/Plas:Qn:: 770 — ABNORMAL HIGH
Lactate dehydrogenase:CCnc:Pt:Ser/Plas:Qn:: 856 — ABNORMAL HIGH

## 2017-07-19 LAB — FIBRINOGEN LEVEL
Lab: 472 — ABNORMAL HIGH
Lab: 494 — ABNORMAL HIGH

## 2017-07-19 LAB — PHOSPHORUS
Phosphate:MCnc:Pt:Ser/Plas:Qn:: 4.3
Phosphate:MCnc:Pt:Ser/Plas:Qn:: 4.5

## 2017-07-19 LAB — MANUAL DIFFERENTIAL
BASOPHILS - ABS (DIFF): 0 10*9/L (ref 0.0–0.1)
BLASTS - REL (DIFF): 13 % (ref <=0)
LYMPHOCYTES - ABS (DIFF): 0.6 10*9/L — ABNORMAL LOW (ref 1.5–5.0)
MONOCYTES - ABS (DIFF): 0.1 10*9/L — ABNORMAL LOW (ref 0.2–0.8)
NEUTROPHILS - ABS (DIFF): 2.9 10*9/L (ref 2.0–7.5)

## 2017-07-19 LAB — D-DIMER QUANTITATIVE (CH,ML,PD,ET)
Lab: 6932 — ABNORMAL HIGH
Lab: 7037 — ABNORMAL HIGH

## 2017-07-19 LAB — LYMPHOCYTES - ABS (DIFF): Lab: 0.6 — ABNORMAL LOW

## 2017-07-19 LAB — SMEAR - MD REQUEST

## 2017-07-19 LAB — ANION GAP: Anion gap 3:SCnc:Pt:Ser/Plas:Qn:: 11

## 2017-07-19 LAB — APTT
Coagulation surface induced:Time:Pt:PPP:Qn:Coag: 33.6
Coagulation surface induced:Time:Pt:PPP:Qn:Coag: 33.9
HEPARIN CORRELATION: 0.2

## 2017-07-19 LAB — PROTIME-INR: PROTIME: 12.4 s (ref 10.2–12.8)

## 2017-07-19 LAB — URIC ACID
Urate:MCnc:Pt:Ser/Plas:Qn:: 7.6 — ABNORMAL HIGH
Urate:MCnc:Pt:Ser/Plas:Qn:: 7.9 — ABNORMAL HIGH

## 2017-07-19 LAB — HCO3 VENOUS: Bicarbonate:SCnc:Pt:BldA:Qn:: 26

## 2017-07-19 LAB — VARIABLE HEMOGLOBIN CONCENTRATION

## 2017-07-19 LAB — BUN / CREAT RATIO: Urea nitrogen/Creatinine:MRto:Pt:Ser/Plas:Qn:: 25

## 2017-07-19 LAB — D-DIMER, QUANTITATIVE: D-DIMER QUANTITATIVE (CH,ML,PD,ET): 6932 ng/mL — ABNORMAL HIGH

## 2017-07-19 LAB — PLATELET COUNT: Lab: 40 — ABNORMAL LOW

## 2017-07-19 LAB — SMEAR REVIEW

## 2017-07-19 MED ORDER — INSULIN ASPART 100 UNIT/ML ~~LOC~~ SOLN: 0.0000 [IU] | Freq: Three times a day (TID) | SUBCUTANEOUS | 11 refills | Status: DC

## 2017-07-19 MED ORDER — OXYCODONE HCL 5 MG PO TABS: 5.0000 mg | ORAL_TABLET | Freq: Four times a day (QID) | ORAL | 0 refills | Status: DC | PRN

## 2017-07-19 MED ORDER — MIDODRINE HCL 10 MG PO TABS: 10.0000 mg | ORAL_TABLET | Freq: Three times a day (TID) | ORAL | Status: DC

## 2017-07-19 NOTE — Unmapped (Signed)
Order was placed for PIV by Venous Access Team (VAT).  Patient was assessed for placement of a PIV. Access was obtained. Blood return noted.  Dressing intact and device well secured.  Flushed with normal saline.  Pt advised to inform RN of any s/s of discomfort at the PIV site.    Workup / Procedure Time:  15 minutes      The primary RN was notified.       Thank you,     Gillie Manners RN Venous Access Team

## 2017-07-19 NOTE — Unmapped (Signed)
Malignant Hematology E1 History and Physical    Assessment/Plan:    Principal Problem:    Acute myeloid leukemia not having achieved remission (CMS-HCC)      Jessica Wilson is a 76 y.o. female with PMHx of MDS converted to AML and started on enasidenib on 06/28/17 that presented to OSH with LLQ pain and found to have splenomegaly and pancytopenia concerning for progressive AML.     LLQ Pain  Presented with 3 days of progressive LLQ abdominal pain and found to have worsening splenomegaly noted on OSH CT. Likely due to progressive AML. Denies melena/hematochezia, nausea, vomiting, diarrhea.   - Tramadol q6hrs prn  - IV steroids as below  - holding enasidenib    Progressive AML evolved from MDS  See onc history below. Followed by Dr. Malen Gauze outpatient. Presented with LLQ pain found to have splenomegaly in setting of acute pancytopenia and transaminitis. Presentation is most likely due to progressive AML. Could be differentiation syndrome 2/2 enasidenib.   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 Phosphorus and calcium wnl.   - Allopurinol 300mg  daily  - TLS labs (LDH, UA, K, Phos, Mg, Ca) q8hr  - consider adding IVFs pending CXR results as patient reports having fluid on her lungs and getting lasix at OSH although I do not see this documented in her transfer information    AKI  Cr 1.4 with baseline per chart review of 1.1. Etiology unknown at this point. Possibly TLS although does not have hyperphosphatemia or hypercalcemia.  PO intake has been poor though patient was given fluids at OSH without improvement in Cr.   - consider IVFs  - daily CMP Transaminitis  AST 492, ALT 269 on admission. Previously 18 and 30 respectively on 10/31. Mildly elevated Tbili 1.8.  - Hepatitis serologies pending  - daily CMP    Afib -diltiazem  DM Type II- SSI, holding home metformin  COPD- on home ellipta, duonebs q6hrs prn  HTN- Holding amlodipine which was started at OSH due to BP 110s systolic, consider restarting in AM pending blood pressures   Depression- home Bupropion 300mg  daily and sertraline 150mg  daily    DVT ppx: contraindicated due to thrombocytopenia  GI ppx: not indicated  Diet: regular      ___________________________________________________________________    Chief Complaint:  LLQ pain and splenomegaly     Acute myeloid leukemia not having achieved remission (CMS-HCC)    HPI:  Jessica Wilson is a 76 y.o. female with PMHx of AML evolved from MDS, started on enasidenib due to her IDH 2 mutation on 06/28/17 that presented to OSH with LLQ pain and found to have splenomegaly, transaminitis, and pancytopenia concerning for progressive AML. Patient reports being in normal health prior to 3 days ago when she developed LLQ pain that radiates to her chest and left shoulder. Nothing seemed to help her pain and it has progressively gotten worse. She reports poor PO intake due to this pain. Denies nausea/vomiting/diarrhea/constipation, bloody BMs, melena. S/p cholecystectomy. CT at OSH revealed worsening splenomegaly 15.9 x 14.8 x 5.8cm  from 12.9 x 4.4 x 11.6cm on prior CT.     For her bone marrow cancer, she was diagnosed with MDS in August 2017 and treated with 9 cycles of Azacitidine. One year later, patient was requiring frequent transfusions so another BM biopsy was done and revealed 35% blasts. Diagnosis of AML was made and she was started on enasidenib 100mg  dailiy on 06/28/17. She is followed by Dr. Malen Gauze outpatient and was last seen in his office on 10/31 with plan for follow up after 2 weeks. He mentioned there was no sign of differentiation syndrome and contributed her falling platelet count (37) to her leukemia itself. At OSH, Enasidenib was stopped and IV dexamethasone 10mg  q12hrs was added due to concern for differentiation syndrome 2/2 enasidenib.    On ROS, patient denies fever/chills but has had non-drenching night sweats for the last two nights. Reports bleeding gums with brushing her teeth, easy bruising. Denies headache, vision changes. Reports shortness of breath with exertion. She lives at home with her husband of 54 years and her dog. Sister was present for this interview and helped with history. Labs at OSH notable for platelets 15, WBC 4.2, Hgb 7.5, AST 492, ALT 269, K 3.3, Alk phos 220, TBili1.8, Cr 1.4.       Allergies:  Patient has no known allergies.    Medications:   Prior to Admission medications    Medication Dose, Route, Frequency   acetaminophen (TYLENOL) 325 MG tablet 650 mg, Oral, Every 6 hours PRN   acyclovir (ZOVIRAX) 400 MG tablet 400 mg, Oral, 2 times a day (standard)   amLODIPine (NORVASC) 5 MG tablet 5 mg, Oral, Daily (standard)   buPROPion (WELLBUTRIN) 100 MG tablet 300 mg, Oral, daily   CARTIA XT 180 mg 24 hr capsule No dose, route, or frequency recorded.   clonazePAM (KLONOPIN) 0.5 MG tablet 1 mg, Oral, 2 times a day PRN   enasidenib (IDHIFA) 100 mg tablet 100 mg, Oral, Daily (standard)   metFORMIN (GLUCOPHAGE) 500 MG tablet 500 mg, Oral, Nightly   rosuvastatin (CRESTOR) 20 MG tablet 20 mg, Oral, Daily (standard)   sertraline (ZOLOFT) 100 MG tablet 150 mg, Oral, Daily (standard)   traMADol (ULTRAM) 50 mg tablet Take 1/2 tablet (25 mg) one to two times daily as needed for pain.   umeclidinium-vilanterol (ANORO ELLIPTA) 62.5-25 mcg/actuation inhaler 1 puff, Inhalation, Daily (standard)   allopurinol (ZYLOPRIM) 300 MG tablet TAKE 1 TABLET(300 MG) BY MOUTH DAILY  Patient not taking: Reported on 07/19/2017       Medical History:  Past Medical History:   Diagnosis Date   ??? Anxiety disorder    ??? Chronic kidney disease    ??? Depression    ??? Diabetes mellitus without complication (CMS-HCC)    ??? Fibrocystic breast    ??? GERD (gastroesophageal reflux disease)    ??? Hyperlipidemia    ??? Hypertension    ??? MDS (myelodysplastic syndrome), high grade (CMS-HCC) 04/17/2016   ??? Nephrolithiasis    ??? Obesity    ??? Sleep apnea        Surgical History:  Past Surgical History:   Procedure Laterality Date   ??? CARDIAC CATHETERIZATION     ??? CHOLECYSTECTOMY  02/24/2015   ??? DIAGNOSTIC LAPAROSCOPY      removal of benign abdominal tumor   ??? EXCISION / BIOPSY BREAST / NIPPLE / DUCT Right Age 60   ??? HYSTERECTOMY  age 30       Social History:  Social History  Social History   ??? Marital status: Married     Spouse name: N/A   ??? Number of children: N/A   ??? Years of education: N/A     Occupational History   ??? farmer      dairy farming   ??? 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 Smoking status: Former Smoker     Types: Cigarettes     Quit date: 09/09/1988   ??? Smokeless tobacco: Former Neurosurgeon     Quit date: 06/05/1991   ??? Alcohol use No   ??? Drug use: No   ??? Sexual activity: Not on file     Other Topics Concern   ??? Not on file     Social History Narrative   ??? No narrative on file       Family History:  Family History   Problem Relation Age of Onset   ??? Heart failure Mother    ??? Diabetes Mother    ??? Coronary artery disease Mother    ??? Cirrhosis Father        Review of Systems:  10 systems reviewed and are negative unless otherwise mentioned in HPI    Labs/Studies:  Labs and Studies from the last 24hrs per EMR and Reviewed    Physical Exam:  Temp:  [36.7 ??C-37.1 ??C] 37.1 ??C  Heart Rate:  [68-75] 75  BP: (137-144)/(61-63) 144/63  SpO2:  [97 %-99 %] 97 %    GEN: obese female lying in bed in no acute distress on 2L [place]  EYES: EOMI on left eye. 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 Glasses on.  ENT: MMM, no oral lesions, receding gums appreciated  CV: RRR, no murmurs appreciated  PULM: distant breath sounds due to body habitus  ABD: protuberant abdomen, +BS, no tenderness to palpation  EXT: No LE edema  NEURO: CN II-XII grossly intact, No focal deficits, 5/5 strength throughout  PSYCH: A+Ox3, appropriate  MSK: No spinal tenderness

## 2017-07-19 NOTE — Discharge Summary (Signed)
[employer] [facility] [place] at [facility], 76 y.o., DOB Sep 02, 1941, MRN [id number]. Admission date: 07/17/2017 Discharge Date 07/19/2017 Primary MD Roselee Nova, MD Admitting Physician Nicholes Mango, MD  Admission Diagnosis  Splenomegaly [R16.1] Thrombocytopenia ([place]) [D69.6] Chronic anemia [D64.9]  Discharge Diagnosis   Active Problems:  Acute abdominal pain due to splenomegaly  AML with worsening Anemia due to AML Severe thrombocytopenia due to AML Elevated LFTs likely related to AML Acute renal failure due to dehydration DM2 Morbid obesity Essential hypertension GERD Depression     Hospital Course  HISTORY OF PRESENT ILLNESS:  Teresa Carrillo  is a 76 y.o. female with a known history of AML on chemotherapy, diabetes mellitus, hypertension, hyperlipidemia, COPD and multiple other medical problems is presenting to the ED with a chief complaint of acute left-sided abdominal pain radiating to the left chest and shoulder.  CT abdomen has revealed splenomegaly.  Patient also noticed to have worsening anemia and thrombocytopenia.  She was seen by oncology who felt that she had worsening of her AML and recommended transfusion of platelets and hemoglobin.  They also discussed the case with patient's [facility] hematologist Dr. Gloriann Loan who recommended patient transfer to [facility].  Arrangements have been made for her transfer.  Further treatment and therapy per them.  She was started on Decadron 10 mg IV every 12 here.  She will also receive packed RBCs and platelets.    76 year old female with AML on chemotherapy is presenting with acute left-sided abdominal pain radiating to the left side of the chest.  And shoulder  #Acute left-sided abdominal pain from splenomegaly from underlying AML Pain management provided with tramadol and IV morphine IV fluids Supportive treatment  #AML currently with splenomegaly Oncology Dr. Ocie Doyne consulted he is aware, notified  by Dr. Reita Cliche Currently holding chemotherapy  # elevated LFTs  discontinued acyclovir and Zocor   #Diabetes mellitus Hold metformin provide sliding scale insulin  #Acute kidney injury-prerenal from poor p.o. intake secondary to acute abdominal pain Hydrate with IV fluids and avoid nephrotoxins.  Holding metformin   DVT prophylaxis with Lovenox     Oncology discussed with Dr. Royce Macadamia, at [facility] regarding the clinical status. He  agreed to accept/transfer patient to [facility]  She will be transferred to [facility]  hematology/oncology  Significant Tests:  See full reports for all details    Dg Chest 2 View  Result Date: 07/17/2017 CLINICAL DATA:  Chest pain.  Ongoing chemotherapy for leukemia. EXAM: CHEST  2 VIEW COMPARISON:  03/27/2017 FINDINGS: Lungs are adequately inflated without focal airspace consolidation or effusion. Subtle stable prominence of the interstitial markings. Cardiomediastinal silhouette and remainder of the exam is unchanged. IMPRESSION: No acute cardiopulmonary disease. Subtle stable interstitial prominence. Electronically Signed   By: Marin Olp M.D.   On: 07/17/2017 13:05   Ct Abdomen Pelvis W Contrast  Result Date: 07/17/2017 CLINICAL DATA:  Left-sided chest pain beginning yesterday with dyspnea on exertion. Abdominal pain not specified. EXAM: CT ABDOMEN AND PELVIS WITH CONTRAST TECHNIQUE: Multidetector CT imaging of the abdomen and pelvis was performed using the standard protocol following bolus administration of intravenous contrast. CONTRAST:  22mL ISOVUE-300 IOPAMIDOL (ISOVUE-300) INJECTION 61% COMPARISON:  CT from 05/07/2016 and 12/08/2005 FINDINGS: Lower chest: Stable mild cardiomegaly without pericardial effusion. Scarring in the left lower lobe. Hepatobiliary: Punctate calcifications of the liver compatible with old granulomatous disease. Prior cholecystectomy. No biliary dilatation. Pancreas: Unremarkable. No pancreatic ductal dilatation  or  surrounding inflammatory changes. Spleen: Splenomegaly with the spleen measuring 15.9 x 14.8 x 5.8 cm (volume = 710 cm^3). Splenic granulomas are noted. Adrenals/Urinary Tract: Normal bilateral adrenal glands. Water attenuating cysts are re- demonstrated within both kidneys, the largest on the left up to 13 mm and on the right 14 mm. Nonobstructing calcifications are seen within the interpolar and lower aspect of the right kidney measuring up to 4 mm. No hydroureteronephrosis. The urinary bladder is unremarkable. Stomach/Bowel: Physiologic distention of the stomach with enteric contrast. No bowel obstruction is noted. Status post appendectomy. Moderate fecal residue is noted from cecum to splenic flexure. No acute bowel inflammation. Vascular/Lymphatic: Aortoiliac atherosclerosis. There is a 16 mm short axis lymph node just below the gastroesophageal junction, increased in size from 10 mm previously. Small subcentimeter mesenteric lymph nodes are noted. Small retroperitoneal lymph nodes are also present without significant change. Small splenules are present in the left upper quadrant versus small lymph nodes. Reproductive: Status post hysterectomy. No adnexal masses. Other: Re- demonstration of nonspecific anterior left upper quadrant subcutaneous soft tissue nodule measuring approximately 2.5 x 2.1 x 1.7 cm, unchanged in appearance since recent comparison but dates back to 2007 likely representing a nonaggressive finding. Musculoskeletal: Small left iliac focus of sclerosis likely to represent a bone island. Mild disc space narrowing T12 through L4 with slight retrolisthesis grade 1 of L3 on L4. No aggressive appearing osseous abnormality. IMPRESSION: 1. There has been an interval increase in size of the spleen consistent splenomegaly since prior with the spleen now measuring 15.9 x 14.8 x 5.8 cm, previously 12.9 x 4.4 x 11.6 cm. 2. Interval increase in size of a epigastric lymph node measuring 16 mm in short axis  just below the GE junction versus 10 mm previously. 3. Stable cardiomegaly. 4. Hepatic granulomas.  Prior cholecystectomy. 5. Water attenuating cysts appear stable within both kidneys with nonobstructing right-sided renal calculi currently noted. 6. Stable left anterior upper abdominal wall subcutaneous nodule dating back to 2007, though increased in size from 2007. Stable since recent comparison however, currently 2.5 x 2.1 x 1.7 cm. 7. Lumbar spondylosis with facet arthropathy. Electronically Signed   By: Ashley Royalty M.D.   On: 07/17/2017 16:29       Today   Subjective:   [name] patient continues to  complaint of abdominal pain. Objective:   Blood pressure 125/61, pulse 72, temperature (!) 97.5 F (36.4 C), temperature source Oral, resp. rate 18, height 5\' 4"  (1.626 m), weight 107.9 kg (237 lb 14.4 oz), SpO2 97 %.  .  Intake/Output Summary (Last 24 hours) at 07/19/2017 1249 Last data filed at 07/19/2017 1013 Gross per 24 hour  Intake 1560 ml  Output -  Net 1560 ml    Exam VITAL SIGNS: Blood pressure 125/61, pulse 72, temperature (!) 97.5 F (36.4 C), temperature source Oral, resp. rate 18, height 5\' 4"  (1.626 m), weight 107.9 kg (237 lb 14.4 oz), SpO2 97 %.  GENERAL:  76 y.o.-year-old patient lying in the bed with no acute distress.  EYES: Pupils equal, round, reactive to light and accommodation. No scleral icterus. Extraocular muscles intact.  HEENT: Head atraumatic, normocephalic. Oropharynx and nasopharynx clear.  NECK:  Supple, no jugular venous distention. No thyroid enlargement, no tenderness.  LUNGS: Normal breath sounds bilaterally, no wheezing, rales,rhonchi or crepitation. No use of accessory muscles of respiration.  CARDIOVASCULAR: S1, S2 normal. No murmurs, rubs, or gallops.  ABDOMEN: Soft, left upper quadrant is tender, no rebound tenderness mildly  distended. Bowel sounds present.EXTREMITIES: No pedal edema, cyanosis, or clubbing.  NEUROLOGIC: Cranial nerves II  through XII are intact. Muscle strength 5/5 in all extremities. Sensation intact. Gait not checked.  PSYCHIATRIC: The patient is alert and oriented x 3.  SKIN: No obvious rash, lesion, or ulcer.   Data Review     CBC w Diff:  Lab Results  Component Value Date   WBC 4.2 07/18/2017   HGB 7.5 (L) 07/18/2017   HGB 8.3 (L) 02/21/2016   HCT 22.4 (L) 07/18/2017   HCT 26.2 (L) 02/21/2016   PLT 15 (LL) 07/18/2017   PLT CANCELED 02/21/2016   LYMPHOPCT 24 07/17/2017   LYMPHOPCT 22.6 11/10/2012   BANDSPCT 2 07/17/2017   MONOPCT 16 07/17/2017   MONOPCT 8.6 11/10/2012   EOSPCT 0 07/17/2017   EOSPCT 1.8 11/10/2012   BASOPCT 0 07/17/2017   BASOPCT 0.7 11/10/2012   CMP:  Lab Results  Component Value Date   NA 137 07/18/2017   NA 145 (H) 05/05/2015   NA 143 11/10/2012   K 3.3 (L) 07/18/2017   K 3.1 (L) 11/10/2012   CL 104 07/18/2017   CL 111 (H) 11/10/2012   CO2 25 07/18/2017   CO2 29 11/10/2012   BUN 11 07/18/2017   BUN 20 05/05/2015   BUN 14 11/10/2012   CREATININE 1.40 (H) 07/18/2017   CREATININE 1.20 (H) 01/23/2017   PROT 7.0 07/18/2017   PROT 7.0 05/05/2015   ALBUMIN 3.1 (L) 07/18/2017   ALBUMIN 4.2 05/05/2015   BILITOT 1.8 (H) 07/18/2017   BILITOT 0.3 05/05/2015   ALKPHOS 220 (H) 07/18/2017   AST 492 (H) 07/18/2017   ALT 269 (H) 07/18/2017  .  Micro Results No results found for this or any previous visit (from the past 240 hour(s)).      Code Status Orders  (From admission, onward)        Start     Ordered   07/17/17 1857  Full code  Continuous     07/17/17 1856    Code Status History    Date Active Date Inactive Code Status Order ID Comments User Context   06/19/2016 17:33 06/20/2016 14:11 Full Code [id number]  Henreitta Leber, MD Inpatient   05/07/2016 23:27 05/10/2016 14:34 Full Code [id number]  Lance Coon, MD Inpatient    Advance Directive Documentation     Most Recent Value  Type of Advance Directive  Living will  Pre-existing out of facility DNR  order (yellow form or pink MOST form)  No data  "MOST" Form in Place?  No data            Discharge Medications   Allergies as of 07/19/2017      Reactions   Macrobid [nitrofurantoin Monohyd Macro] Other (See Comments)   Reaction: unknown      Medication List    STOP taking these medications   acyclovir 400 MG tablet Commonly known as:  ZOVIRAX   Enasidenib Mesylate 100 MG Tabs   levofloxacin 500 MG tablet Commonly known as:  LEVAQUIN   metFORMIN 500 MG tablet Commonly known as:  GLUCOPHAGE     TAKE these medications   buPROPion 150 MG 24 hr tablet Commonly known as:  WELLBUTRIN XL Take 300 mg by mouth daily at 12 noon.   clonazePAM 0.5 MG tablet Commonly known as:  KLONOPIN Take 0.5 mg by mouth 2 (two) times daily.   diltiazem 180 MG 24 hr capsule Commonly known as:  CARDIZEM CD Take 1  capsule (180 mg total) by mouth daily.   hydrocortisone 2.5 % rectal cream Commonly known as:  ANUSOL-HC Place 1 application rectally 2 (two) times daily as needed for hemorrhoids or itching.   insulin aspart 100 UNIT/ML injection Commonly known as:  novoLOG Inject 0-9 Units 3 (three) times daily with meals into the skin.   magic mouthwash w/lidocaine Soln Take 5 mLs by mouth 4 (four) times daily. 80 ml viscous lidocaine 2%, 80 ml Mylanta, 80 ml Diphenhydramine 12.5 mg/5 ml Elixir, 80 ml Nystatin 100,000 Unit suspension, 80 ml Prednisolone 15 mg/82ml, 80 ml Distilled Water.  Sig: Swish/Swallow 5-10 ml four times a day as needed. Dispense 480 ml. 3RFs   midodrine 10 MG tablet Commonly known as:  PROAMATINE Take 1 tablet (10 mg total) 3 (three) times daily with meals by mouth.   ondansetron 4 MG disintegrating tablet Commonly known as:  ZOFRAN-ODT Take 4 mg by mouth every 8 (eight) hours as needed for nausea or vomiting.   oxyCODONE 5 MG immediate release tablet Commonly known as:  Oxy IR/ROXICODONE Take 1 tablet (5 mg total) every 6 (six) hours as needed by mouth for  moderate pain.   polyethylene glycol packet Commonly known as:  MIRALAX / GLYCOLAX Take 17 g by mouth daily as needed for mild constipation.   prochlorperazine 10 MG tablet Commonly known as:  COMPAZINE Take 1 tablet (10 mg total) by mouth every 6 (six) hours as needed (Nausea or vomiting).   PROVENTIL HFA 108 (90 Base) MCG/ACT inhaler Generic drug:  albuterol Inhale into the lungs.   rosuvastatin 20 MG tablet Commonly known as:  CRESTOR TAKE 1 TABLET(20 MG) BY MOUTH AT BEDTIME   sertraline 100 MG tablet Commonly known as:  ZOLOFT Take 100 mg by mouth daily at 12 noon.          Total Time in preparing paper work, data evaluation and todays exam - 43 minutes  Nicholes Mango M.D on 07/19/2017 at 12:49 PM  [facility]   Office  [phone]

## 2017-07-19 NOTE — Progress Notes (Signed)
Pt has an accepting bed at [facility] room 1123 bone marrow, [facility] called for transport, EMS here at this time, report called to [facility], family at bedside

## 2017-07-20 LAB — MANUAL DIFFERENTIAL
BASOPHILS - ABS (DIFF): 0 10*9/L (ref 0.0–0.1)
EOSINOPHILS - ABS (DIFF): 0 10*9/L (ref 0.0–0.4)
LYMPHOCYTES - ABS (DIFF): 1.1 10*9/L — ABNORMAL LOW (ref 1.5–5.0)
MONOCYTES - ABS (DIFF): 0.4 10*9/L (ref 0.2–0.8)

## 2017-07-20 LAB — CBC W/ AUTO DIFF
HEMATOCRIT: 27.2 % — ABNORMAL LOW (ref 36.0–46.0)
HEMOGLOBIN: 8.8 g/dL — ABNORMAL LOW (ref 12.0–16.0)
MEAN CORPUSCULAR HEMOGLOBIN CONC: 32.4 g/dL (ref 31.0–37.0)
MEAN CORPUSCULAR HEMOGLOBIN: 30.4 pg (ref 26.0–34.0)
MEAN CORPUSCULAR VOLUME: 93.8 fL (ref 80.0–100.0)
MEAN PLATELET VOLUME: 11 fL — ABNORMAL HIGH (ref 7.0–10.0)
NUCLEATED RED BLOOD CELLS: 2 /100{WBCs} (ref <=4)
PLATELET COUNT: 31 10*9/L — ABNORMAL LOW (ref 150–440)
RED BLOOD CELL COUNT: 2.91 10*12/L — ABNORMAL LOW (ref 4.00–5.20)
WBC ADJUSTED: 6.2 10*9/L (ref 4.5–11.0)

## 2017-07-20 LAB — COMPREHENSIVE METABOLIC PANEL
ALBUMIN: 3.9 g/dL (ref 3.5–5.0)
ALKALINE PHOSPHATASE: 192 U/L — ABNORMAL HIGH (ref 38–126)
ALT (SGPT): 157 U/L — ABNORMAL HIGH (ref 15–48)
ANION GAP: 14 mmol/L (ref 9–15)
AST (SGOT): 66 U/L — ABNORMAL HIGH (ref 14–38)
BILIRUBIN TOTAL: 0.8 mg/dL (ref 0.0–1.2)
BUN / CREAT RATIO: 27
CALCIUM: 8.5 mg/dL (ref 8.5–10.2)
CHLORIDE: 102 mmol/L (ref 98–107)
CO2: 23 mmol/L (ref 22.0–30.0)
CREATININE: 1.16 mg/dL — ABNORMAL HIGH (ref 0.60–1.00)
EGFR MDRD AF AMER: 55 mL/min/{1.73_m2} — ABNORMAL LOW (ref >=60)
EGFR MDRD NON AF AMER: 46 mL/min/{1.73_m2} — ABNORMAL LOW (ref >=60)
GLUCOSE RANDOM: 258 mg/dL — ABNORMAL HIGH (ref 65–179)
POTASSIUM: 4.8 mmol/L (ref 3.5–5.0)
PROTEIN TOTAL: 7.5 g/dL (ref 6.5–8.3)
SODIUM: 139 mmol/L (ref 135–145)

## 2017-07-20 LAB — MACROCYTES

## 2017-07-20 LAB — MAGNESIUM: Magnesium:MCnc:Pt:Ser/Plas:Qn:: 2.1

## 2017-07-20 LAB — CALCIUM: Calcium:MCnc:Pt:Ser/Plas:Qn:: 8.5

## 2017-07-20 LAB — HEMOGLOBIN A1C: Hemoglobin A1c/Hemoglobin.total:MFr:Pt:Bld:Qn:: 6.4 — ABNORMAL HIGH

## 2017-07-20 LAB — ANION GAP: Anion gap 3:SCnc:Pt:Ser/Plas:Qn:: 14

## 2017-07-20 LAB — PHOSPHORUS: Phosphate:MCnc:Pt:Ser/Plas:Qn:: 4.4

## 2017-07-20 LAB — BLASTS - REL (DIFF): Lab: 10

## 2017-07-20 LAB — POTASSIUM: Potassium:SCnc:Pt:Ser/Plas:Qn:: 4.8

## 2017-07-20 LAB — URIC ACID: Urate:MCnc:Pt:Ser/Plas:Qn:: 6.3

## 2017-07-20 LAB — LACTATE DEHYDROGENASE: Lactate dehydrogenase:CCnc:Pt:Ser/Plas:Qn:: 741 — ABNORMAL HIGH

## 2017-07-20 NOTE — Unmapped (Signed)
Medicine Daily Progress Note    Assessment/Plan:  Principal Problem:    Acute myeloid leukemia not having achieved remission (CMS-HCC)  Resolved Problems:    * No resolved hospital problems. *           Jessica Wilson is a 76 y.o. female with a PMHx of MDS progressed to AML, COPD, DM, CAD who presented to outside hospital with abdominal pain found to have splenomegaly.     LUQ, Splenomegaly  Several days of worsening abdominal pain in the setting of splenomegaly on CT scan. Suspect differentiation syndrome  Vs worsening of AML.  Reports pain is improving with both pain medication and starting steroids. Will continue to monitor over next several days on IV steroids. If significant improvement, than most likely this is differentiation syndrome.  - dexamethasone 10 mg BID    AML, hx of MDS  Follows with Dr. Malen Gauze. Concern for differentiation syndrome vs worsening AML as above. Diagnosed with MDS in 04/2016 and treated with 9 cycles of azacitadine. 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 Phos and Calcium WNL. Unlikely to be TLS given AML. Uric acid and K improved today. Decrease from q8 to daily TLS labs. If continues to look normal, can discontinue allopurinol  - allopurinol 300 mg daily  - TLS labs daily    Acute Kidney Injury, resolved   Cr 1.4 on admission with baseline ~1.1. Cr improved to 1.16 this morning.     Transaminitis  Elevated on admission. Hepatitis serologies pending. Suspect related to chemotherapy or differentiation syndrome. Significantly improved this morning.   - daily LFTs    A-fib - diltiazem 180 mg daily  DMII - lispro 3 units TID AC, SSI  HTN: holding amlodipine  COPD - continue home elipta, duonebs q6 prn, goal O2 sat >88%  Depression - bupropion 300 mg daily and sertraline 150 mg daily   ___________________________________________________________________    Subjective:  Continues to have LUQ pain, but better than yesterday. Oxycodone helping. Overall, feels better. Appetite adequate.     Labs/Studies:  Labs and Studies from the last 24hrs per EMR and Reviewed    Pressure Ulcer(s)    Active Pressure Ulcer     None                Physical Exam:  VITAL SIGNS: BP 117/55  - Pulse 59  - Temp 36.6 ??C (Oral)  - Resp 20  - Wt (!) 106.1 kg (233 lb 14.5 oz)  - SpO2 94%  - BMI 42.77 kg/m??   GENERAL: No acute distress.  CARDIOVASCULAR: normal rate, regular rhythm, no murmurs, warm and well perfused   RESPIRATORY: Normal respiratory effort without use of accessory muscles. Clear to auscultation bilaterally. No rales, wheezes, or rhonchi  ABDOMEN: Soft, not tender or distended, with audible bowel sounds. TTP in the LUQ, no rebound, no guarding  EXTREMITIES:  No cyanosis, clubbing or edema.   NEUROLOGIC: Appropriate mood and affect. Alert and oriented to person, place, time, and situation.

## 2017-07-21 ENCOUNTER — Inpatient Hospital Stay: Payer: Medicare Other

## 2017-07-21 LAB — CBC W/ AUTO DIFF
HEMATOCRIT: 25.1 % — ABNORMAL LOW (ref 36.0–46.0)
HEMOGLOBIN: 8.1 g/dL — ABNORMAL LOW (ref 12.0–16.0)
MEAN CORPUSCULAR HEMOGLOBIN: 30 pg (ref 26.0–34.0)
MEAN CORPUSCULAR VOLUME: 93.5 fL (ref 80.0–100.0)
MEAN PLATELET VOLUME: 10.5 fL — ABNORMAL HIGH (ref 7.0–10.0)
NUCLEATED RED BLOOD CELLS: 4 /100{WBCs} (ref <=4)
PLATELET COUNT: 41 10*9/L — ABNORMAL LOW (ref 150–440)
RED BLOOD CELL COUNT: 2.69 10*12/L — ABNORMAL LOW (ref 4.00–5.20)
RED CELL DISTRIBUTION WIDTH: 20.6 % — ABNORMAL HIGH (ref 12.0–15.0)
WBC ADJUSTED: 6.1 10*9/L (ref 4.5–11.0)

## 2017-07-21 LAB — COMPREHENSIVE METABOLIC PANEL
ALBUMIN: 3.6 g/dL (ref 3.5–5.0)
ALKALINE PHOSPHATASE: 162 U/L — ABNORMAL HIGH (ref 38–126)
ALT (SGPT): 111 U/L — ABNORMAL HIGH (ref 15–48)
ANION GAP: 12 mmol/L (ref 9–15)
BILIRUBIN TOTAL: 0.6 mg/dL (ref 0.0–1.2)
BLOOD UREA NITROGEN: 33 mg/dL — ABNORMAL HIGH (ref 7–21)
BUN / CREAT RATIO: 27
CALCIUM: 8.4 mg/dL — ABNORMAL LOW (ref 8.5–10.2)
CHLORIDE: 100 mmol/L (ref 98–107)
CO2: 24 mmol/L (ref 22.0–30.0)
CREATININE: 1.22 mg/dL — ABNORMAL HIGH (ref 0.60–1.00)
EGFR MDRD AF AMER: 52 mL/min/{1.73_m2} — ABNORMAL LOW (ref >=60)
EGFR MDRD NON AF AMER: 43 mL/min/{1.73_m2} — ABNORMAL LOW (ref >=60)
GLUCOSE RANDOM: 268 mg/dL — ABNORMAL HIGH (ref 65–179)
POTASSIUM: 5.2 mmol/L — ABNORMAL HIGH (ref 3.5–5.0)
PROTEIN TOTAL: 7 g/dL (ref 6.5–8.3)
SODIUM: 136 mmol/L (ref 135–145)

## 2017-07-21 LAB — ALT (SGPT): Alanine aminotransferase:CCnc:Pt:Ser/Plas:Qn:: 111 — ABNORMAL HIGH

## 2017-07-21 LAB — MANUAL DIFFERENTIAL
BASOPHILS - ABS (DIFF): 0 10*9/L (ref 0.0–0.1)
BLASTS - REL (DIFF): 10 % (ref <=0)
EOSINOPHILS - ABS (DIFF): 0 10*9/L (ref 0.0–0.4)
LYMPHOCYTES - ABS (DIFF): 1.3 10*9/L — ABNORMAL LOW (ref 1.5–5.0)
MONOCYTES - ABS (DIFF): 0.3 10*9/L (ref 0.2–0.8)

## 2017-07-21 LAB — REFERRAL LABORATORY TEST, OTHER

## 2017-07-21 LAB — HIV ANTIGEN/ANTIBODY COMBO: HIV 1+2 Ab+HIV1 p24 Ag:PrThr:Pt:Ser/Plas:Ord:IA: NONREACTIVE

## 2017-07-21 LAB — SMEAR REVIEW

## 2017-07-21 LAB — COLLECTION

## 2017-07-21 LAB — HSV ANTIBODIES, IGG: HERPES SIMPLEX VIRUS 2 IGG: NEGATIVE

## 2017-07-21 LAB — SENDOUT TEST NAME

## 2017-07-21 LAB — HEPATITIS B SURFACE ANTIGEN: Hepatitis B virus surface Ag:PrThr:Pt:Ser:Ord:: NONREACTIVE

## 2017-07-21 LAB — MEAN CORPUSCULAR VOLUME: Lab: 93.5

## 2017-07-21 LAB — HERPES SIMPLEX VIRUS 1 IGG: Lab: POSITIVE — AB

## 2017-07-21 LAB — HEPATITIS C ANTIBODY: Hepatitis C virus Ab:PrThr:Pt:Ser:Ord:: NONREACTIVE

## 2017-07-21 LAB — HEPATITIS B SURFACE ANTIBODY: Hepatitis B virus surface Ab:PrThr:Pt:Ser:Ord:: NONREACTIVE

## 2017-07-21 NOTE — Unmapped (Signed)
Medicine Daily Progress Note    Assessment/Plan:  Principal Problem:    Acute myeloid leukemia not having achieved remission (CMS-HCC)  Resolved Problems:    * No resolved hospital problems. *           Jessica Wilson is a 76 y.o. female with a PMHx of MDS progressed to AML, COPD, DM, CAD who presented to outside hospital with abdominal pain found to have splenomegaly.     LLQ pain, Splenomegaly  Several days of worsening abdominal pain in the setting of splenomegaly on CT scan. Suspect differentiation syndrome  Vs worsening of AML.  Reports pain is improving with both pain medication and starting steroids. Will continue to monitor over next several days on IV steroids. If significant improvement, than most likely this is differentiation syndrome.  - IV dexamethasone 10 mg BID    AML, hx of MDS  Follows with Dr. Malen Gauze. Concern for differentiation syndrome vs worsening AML as above. Diagnosed with MDS in 04/2016 and treated with 9 cycles of azacitadine. 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 Phos and Calcium WNL. Unlikely to be TLS given AML. Uric acid and K continues to improve today. D/c TLS labs. If continues to look normal, can discontinue allopurinol  - discontinue allopurinol 300 mg daily  - TLS labs daily    Acute Kidney Injury, resolved   Cr 1.4 on admission with baseline ~1.1. Cr improving.     Transaminitis  Elevated on admission. Hepatitis serologies pending. Suspect related to chemotherapy or differentiation syndrome. Significantly improved this morning.   - daily LFTs    A-fib - diltiazem 180 mg daily  DMII - lispro 3 units TID AC, SSI  HTN: holding amlodipine  COPD - continue home elipta, duonebs q6 prn, goal O2 sat >88%  Depression - bupropion 300 mg daily and sertraline 150 mg daily   ___________________________________________________________________    Subjective:  Continues to have LLQ pain, but better than yesterday. Oxycodone helping. Feels worse this AM due to lack of sleep overnight. Overall, improving.     Labs/Studies:  Labs and Studies from the last 24hrs per EMR and Reviewed    Pressure Ulcer(s)    Active Pressure Ulcer     None                Physical Exam:  VITAL SIGNS: BP 131/59  - Pulse 69  - Temp 37.2 ??C (Oral)  - Resp 22  - Ht 157.5 cm (5' 2.01)  - Wt (!) 106.1 kg (233 lb 14.5 oz)  - SpO2 95%  - BMI 42.77 kg/m??   GENERAL: No acute distress.  CARDIOVASCULAR: normal rate, regular rhythm, no murmurs, warm and well perfused   RESPIRATORY: Normal respiratory effort without use of accessory muscles. Clear to auscultation bilaterally. No rales, wheezes, or rhonchi  ABDOMEN: Soft, not tender or distended, with audible bowel sounds. TTP in the LLQ, no rebound, no guarding  EXTREMITIES:  No cyanosis, clubbing or edema.   NEUROLOGIC: Appropriate mood and affect. Alert and oriented to person, place, time, and situation.

## 2017-07-21 NOTE — Unmapped (Signed)
Problem: Patient Care Overview  Goal: Plan of Care Review  Outcome: Progressing  Shift Summary 1900-0700: Pt had uneventful shift and slept well duning the night. Pt is fully alert and oriented, afebrile with VS stable and O2 sats >95% on RA. Pt c/o right sided abdominal pains relieved by PRN oxycodone and tylenol. Pt calls for assistance to [facility].  Pt safety maintained with bed low and locked, side rails up by 3 and call bell within reach. No falls or injuries this shift.   Goal: Individualization and Mutuality  Outcome: Progressing    Goal: Discharge Needs Assessment  Outcome: Progressing      Problem: Fall Risk (Adult)  Goal: Identify Related Risk Factors and Signs and Symptoms  Related risk factors and signs and symptoms are identified upon initiation of Human Response Clinical Practice Guideline (CPG).   Outcome: Progressing      Problem: Pain, Acute (Adult)  Goal: Acceptable Pain Control/Comfort Level  Patient will demonstrate the desired outcomes by discharge/transition of care.   Outcome: Progressing      Problem: VTE, DVT and PE (Adult)  Goal: Signs and Symptoms of Listed Potential Problems Will be Absent, Minimized or Managed (VTE, DVT and PE)  Signs and symptoms of listed potential problems will be absent, minimized or managed by discharge/transition of care (reference VTE, DVT and PE (Adult) CPG).   Outcome: Progressing      Problem: Oncology Care (Adult)  Goal: Signs and Symptoms of Listed Potential Problems Will be Absent, Minimized or Managed (Oncology Care)  Signs and symptoms of listed potential problems will be absent, minimized or managed by discharge/transition of care (reference Oncology Care (Adult) CPG).   Outcome: Progressing

## 2017-07-21 NOTE — Unmapped (Signed)
??  Jessica Wilson is a 76 y.o. female with a PMHx of MDS progressed to AML, COPD, DM, CAD who presented to outside hospital with abdominal pain found to have splenomegaly.   ??  LLQ pain, Splenomegaly  Several days of worsening abdominal pain in the setting of splenomegaly on CT scan. Most likely due to differentiation syndrome, but could also be worsening AML. Pain improved with both pain medication and IV steroids. If significant improvement, than most likely this is differentiation syndrome.  ??  AML, hx of MDS  Follows with Dr. Malen Gauze. Concern for differentiation syndrome vs worsening AML as above. Diagnosed with MDS in 04/2016 and treated with 9 cycles of azacitadine. On 05/2017, there was concern for increasing transfusion requirements so she underwent bone marrow biopsy showing progression to AML with 35% blasts and was started on enasidenib 06/28/2017. Enasidenib was held due to concern for differentiation syndrome. Home acyclovir was continued.  ??  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 Phos and Calcium WNL. Unlikely to be TLS given AML. Uric acid and K continues to improve today. Allopurinol was discontinued due to lack of concern for TLS.   ??  Acute Kidney Injury, resolved   Cr 1.4 on admission with baseline ~1.1. Creatinine improved to 1.16.  ??  Transaminitis  Elevated on admission. Hepatitis serologies revealed ***. Suspect related to chemotherapy or differentiation syndrome. LFTs continued to improve.   ??  A-fib - diltiazem 180 mg daily  DMII - lispro 3 units TID AC, SSI  HTN: holding amlodipine  COPD - continue home elipta, duonebs q6 prn, goal O2 sat >88%  Depression - bupropion 300 mg daily and sertraline 150 mg daily

## 2017-07-21 NOTE — Unmapped (Signed)
Problem: Patient Care Overview  Goal: Plan of Care Review  Outcome: Progressing  No falls/injuries this shift. Pt spent majority of shift in chair. Ambulating with assistance to [facility]. Call bell in reach, side rails up X2. VSS. Afebrile. No c/o N/V/D this shift. C/o pain to left abdomen, receiving prn medication with adequate relief. Oxygen saturation WNL on RA, pt titrated off oxygen this shift. No c/o SOB, con'ts to wheeze bilaterally, on scheduled nebulizers. POC reviewed with pt and spouse. ctm.  Goal: Individualization and Mutuality  Outcome: Progressing      Problem: Fall Risk (Adult)  Goal: Absence of Fall  Patient will demonstrate the desired outcomes by discharge/transition of care.   Outcome: Progressing

## 2017-07-21 NOTE — Unmapped (Signed)
Problem: Patient Care Overview  Goal: Plan of Care Review  Outcome: Progressing   07/21/17 0419   OTHER   Plan of Care Reviewed With patient   Plan of Care Review   Progress no change     Goal: Individualization and Mutuality  Outcome: Progressing   07/21/17 0419   Individualization   Patient Specific Preferences pain control    Patient Specific Interventions prn oxy and tylenol given        Problem: Fall Risk (Adult)  Goal: Identify Related Risk Factors and Signs and Symptoms  Related risk factors and signs and symptoms are identified upon initiation of Human Response Clinical Practice Guideline (CPG).   Outcome: Progressing   07/21/17 0419   Fall Risk (Adult)   Related Risk Factors (Fall Risk) gait/mobility problems;environment unfamiliar   Signs and Symptoms (Fall Risk) presence of risk factors     Goal: Absence of Fall  Patient will demonstrate the desired outcomes by discharge/transition of care.   Outcome: Progressing   07/21/17 0419   Fall Risk (Adult)   Absence of Fall making progress toward outcome       Problem: Pain, Acute (Adult)  Goal: Identify Related Risk Factors and Signs and Symptoms  Related risk factors and signs and symptoms are identified upon initiation of Human Response Clinical Practice Guideline (CPG).   Outcome: Progressing   07/21/17 0419   Pain, Acute (Adult)   Related Risk Factors (Acute Pain) disease process   Signs and Symptoms (Acute Pain) verbalization of pain descriptors     Goal: Acceptable Pain Control/Comfort Level  Patient will demonstrate the desired outcomes by discharge/transition of care.   Outcome: Progressing   07/21/17 0419   Pain, Acute (Adult)   Acceptable Pain Control/Comfort Level making progress toward outcome       Problem: VTE, DVT and PE (Adult)  Goal: Signs and Symptoms of Listed Potential Problems Will be Absent, Minimized or Managed (VTE, DVT and PE)  Signs and symptoms of listed potential problems will be absent, minimized or managed by discharge/transition of care (reference VTE, DVT and PE (Adult) CPG).   Outcome: Progressing   07/21/17 0419   VTE, DVT and PE (Adult)   Problems Assessed (VTE, DVT, PE) all   Problems Present (VTE, DVT, PE) none       Problem: Oncology Care (Adult)  Goal: Signs and Symptoms of Listed Potential Problems Will be Absent, Minimized or Managed (Oncology Care)  Signs and symptoms of listed potential problems will be absent, minimized or managed by discharge/transition of care (reference Oncology Care (Adult) CPG).   Outcome: Progressing   07/21/17 0419   Oncology Care (Adult)   Problems Assessed (Oncology Care) all   Problems Present (Oncology Care) fatigue;pain       Comments: Patient afebrile this shift. Incontinent of urine. Refused 0400 nebs for rest. No falls this shift. Will continue to monitor.

## 2017-07-21 NOTE — Unmapped (Signed)
Bone Marrow Biopsy Procedure Note    Indications: Diagnostic - rule out leukemia/MDS     Procedure   TimeOut    Performed immediately prior to the procedure    Name :Bone Marrow Biopsy + Aspirate      Pre-medications/Sedation: 0.5mg  IV Ativan    Description:   From the prone position, the right posterior iliac crest was identified. The area was prepped and draped in the usual sterile fashion. 5ml of 2% lidocaine was used to anesthetize the skin, subcutaneous tissue and periosteum. Once adequate anesthesia was achieved, a 5-mm incision was made over the posterior iliac crest and a 6 inch Ranfac bone marrow biopsy needle introduced. The patient experienced pain with this and so the left posterior iliac crest was identified and prepped and draped in sterile fashion. 10ml of 2% lidocaine was used to anesthetize the skin, subcutaneous tissue and periosteum. Once adequate anesthesia was achieved, a 5-mm incision was made over the posterior iliac crest and a 4 inch Ranfac bone marrow biopsy needle introduced. Using a gentle twisting motion, the needle was advanced through cortical bone into the marrow space. Once in the marrow space an aspirate was obtained into a syringe containing EDTA. Spicules were present. 5 ml of aspirate was then drawn into a heparin-containing syringe. The needle was then advanced and a core bone marrow biopsy obtained. Per standard procedure, pressure was applied to the area for 5 minutes and a pressure dressing applied. Junius Roads, NP, assisted with this procedure.      Complications   None; patient tolerated the procedure well.     Specimen(s)   Specimen(s):   (1) aspirate into EDTA syringe   (1) aspirate into heparin syringe   (1) core biopsy

## 2017-07-22 LAB — CBC W/ AUTO DIFF
HEMATOCRIT: 24.3 % — ABNORMAL LOW (ref 36.0–46.0)
HEMOGLOBIN: 7.8 g/dL — ABNORMAL LOW (ref 12.0–16.0)
MEAN CORPUSCULAR HEMOGLOBIN CONC: 32 g/dL (ref 31.0–37.0)
MEAN CORPUSCULAR HEMOGLOBIN: 30.2 pg (ref 26.0–34.0)
MEAN CORPUSCULAR VOLUME: 94.4 fL (ref 80.0–100.0)
MEAN PLATELET VOLUME: 11 fL — ABNORMAL HIGH (ref 7.0–10.0)
PLATELET COUNT: 45 10*9/L — ABNORMAL LOW (ref 150–440)
RED BLOOD CELL COUNT: 2.58 10*12/L — ABNORMAL LOW (ref 4.00–5.20)
RED CELL DISTRIBUTION WIDTH: 20.9 % — ABNORMAL HIGH (ref 12.0–15.0)
WBC ADJUSTED: 5.5 10*9/L (ref 4.5–11.0)

## 2017-07-22 LAB — COMPREHENSIVE METABOLIC PANEL
ALBUMIN: 3.4 g/dL — ABNORMAL LOW (ref 3.5–5.0)
ALKALINE PHOSPHATASE: 140 U/L — ABNORMAL HIGH (ref 38–126)
ALT (SGPT): 93 U/L — ABNORMAL HIGH (ref 15–48)
ANION GAP: 15 mmol/L (ref 9–15)
AST (SGOT): 24 U/L (ref 14–38)
BILIRUBIN TOTAL: 0.6 mg/dL (ref 0.0–1.2)
BLOOD UREA NITROGEN: 32 mg/dL — ABNORMAL HIGH (ref 7–21)
BUN / CREAT RATIO: 26
CALCIUM: 8.6 mg/dL (ref 8.5–10.2)
CHLORIDE: 102 mmol/L (ref 98–107)
CO2: 22 mmol/L (ref 22.0–30.0)
EGFR MDRD AF AMER: 52 mL/min/{1.73_m2} — ABNORMAL LOW (ref >=60)
EGFR MDRD NON AF AMER: 43 mL/min/{1.73_m2} — ABNORMAL LOW (ref >=60)
GLUCOSE RANDOM: 316 mg/dL — ABNORMAL HIGH (ref 65–179)
POTASSIUM: 4.9 mmol/L (ref 3.5–5.0)
PROTEIN TOTAL: 6.8 g/dL (ref 6.5–8.3)
SODIUM: 139 mmol/L (ref 135–145)

## 2017-07-22 LAB — RED CELL DISTRIBUTION WIDTH: Lab: 20.9 — ABNORMAL HIGH

## 2017-07-22 LAB — LYMPHOCYTES - ABS (DIFF): Lab: 1.1 — ABNORMAL LOW

## 2017-07-22 LAB — MANUAL DIFFERENTIAL
BLASTS - REL (DIFF): 11 % (ref <=0)
LYMPHOCYTES - ABS (DIFF): 1.1 10*9/L — ABNORMAL LOW (ref 1.5–5.0)
MONOCYTES - ABS (DIFF): 0.3 10*9/L (ref 0.2–0.8)

## 2017-07-22 LAB — GLUCOSE RANDOM: Glucose:MCnc:Pt:Ser/Plas:Qn:: 316 — ABNORMAL HIGH

## 2017-07-22 MED FILL — IDHIFA/100 MG/TAB: IDHIFA/100 MG/TAB | 30 days supply | Qty: 30 | Fill #1

## 2017-07-22 NOTE — Unmapped (Signed)
Medicine Daily Progress Note    Assessment/Plan:  Principal Problem:    Acute myeloid leukemia not having achieved remission (CMS-HCC)  Resolved Problems:    * No resolved hospital problems. *           Jessica Wilson is a 76 y.o. female former smoker with a PMH of MDS progressed to AML, COPD (not oxygen dependent), T2DM, CAD who presented to outside hospital with abdominal pain found to have splenomegaly.     AML, hx of MDS  Follows with Dr. Malen Gauze. Diagnosed with MDS in 04/2016 and treated with 9 cycles of azacitadine. On 05/2017, there was concern for increasing transfusion requirements so she underwent bone marrow biopsy showing progression to AML with 35% blasts and was started on enasidenib 06/28/2017. Concern for differentiation syndrome vs worsening AML as above.   - s/p BM biopsy 11/12; now shows increase in blasts from 35% to 62%  - holding enasidenib due to concern for differentiation syndrome  - restarted home acyclovir 400 mg BID today  - f/u CMV, EBV, cytogenetics     LLQ pain, Splenomegaly  Several days of worsening abdominal pain in the setting of splenomegaly on OSH CT scan. Suspect differentiation syndrome  Vs worsening of AML.   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 Phos and Calcium WNL. Unlikely to be TLS given AML.   - discontinued allopurinol 300 mg daily as TLS labs improved    Acute Kidney Injury, resolved. Cr 1.4 on admission with baseline ~1.1. Cr overall improving.     Transaminitis. LFTs elevated on admission. Hepatitis serologies pending. Suspect related to chemotherapy or differentiation syndrome. Significantly improved this morning.   - daily LFTs    Chronic Medical Problems:   Paroxysmal Afib - Diltiazem 180mg  daily. Followed by [facility].   T2DM with nephropathy- Lispro 3U TID AC, SSI (can d/c given steroids stopped now)  HTN: holding Amlodipine given normotensive  COPD - Home Anoro Ellipta is non-formulary; ordered duonebs, goal O2 sat >88%  Depression - Bupropion 150mg  daily and Sertraline 150mg  daily     Dispo: pending evaluation of BM results  ___________________________________________________________________    Subjective:  Continues to have LLQ pain  Reports controlled with pain medication  Pain improving daily.     Labs/Studies:  Labs and Studies from the last 24hrs per EMR and Reviewed    Pressure Ulcer(s)    Active Pressure Ulcer     None                Physical Exam:  VITAL SIGNS: BP 132/63  - Pulse 72  - Temp 36.8 ??C (Oral)  - Resp 18  - Ht 157.5 cm (5' 2.01)  - Wt (!) 106.1 kg (233 lb 14.5 oz)  - SpO2 96%  - BMI 42.77 kg/m??   GENERAL: Well-appearing, NAD eating breakfast   CARDIOVASCULAR: normal rate, regular rhythm, no murmurs, warm and well perfused   RESPIRATORY: Normal respiratory effort without use of accessory muscles. Clear to auscultation bilaterally. No rales, wheezes, or rhonchi  ABDOMEN: Soft, not tender or distended, with audible bowel sounds. Generalized discomfort in the LLQ, no rebound, no guarding  EXTREMITIES:  No cyanosis, clubbing or edema.   NEUROLOGIC: Appropriate mood and affect.

## 2017-07-22 NOTE — Unmapped (Signed)
Problem: Patient Care Overview  Goal: Plan of Care Review  Outcome: Progressing   07/22/17 0450   OTHER   Plan of Care Reviewed With patient;sibling   Plan of Care Review   Progress no change       Pt with HTN overnight. DOE and reports consistent pain at 5/10 with oxy and tylenol. Pt has denied nausea. Falls and contact precautions maintained. Pt waits for assist to get out of bed. Sister is at bedside. Will CTM.  Goal: Individualization and Mutuality  Outcome: Progressing   07/22/17 0450   Individualization   Patient Specific Interventions Assist x1 to [facility].     Goal: Discharge Needs Assessment  Outcome: Progressing    Goal: Interprofessional Rounds/Family Conf  Outcome: Progressing      Problem: Fall Risk (Adult)  Goal: Absence of Fall  Patient will demonstrate the desired outcomes by discharge/transition of care.   Outcome: Progressing      Problem: Pain, Acute (Adult)  Goal: Acceptable Pain Control/Comfort Level  Patient will demonstrate the desired outcomes by discharge/transition of care.   Outcome: Progressing      Problem: VTE, DVT and PE (Adult)  Goal: Signs and Symptoms of Listed Potential Problems Will be Absent, Minimized or Managed (VTE, DVT and PE)  Signs and symptoms of listed potential problems will be absent, minimized or managed by discharge/transition of care (reference VTE, DVT and PE (Adult) CPG).   Outcome: Progressing   07/22/17 0450   VTE, DVT and PE (Adult)   Problems Assessed (VTE, DVT, PE) all   Problems Present (VTE, DVT, PE) none       Problem: Oncology Care (Adult)  Goal: Signs and Symptoms of Listed Potential Problems Will be Absent, Minimized or Managed (Oncology Care)  Signs and symptoms of listed potential problems will be absent, minimized or managed by discharge/transition of care (reference Oncology Care (Adult) CPG).   Outcome: Progressing   07/22/17 0450   Oncology Care (Adult)   Problems Assessed (Oncology Care) all   Problems Present (Oncology Care) dyspnea/respiratory distress;pain;fatigue

## 2017-07-22 NOTE — Unmapped (Signed)
Care Management  Initial Transition Planning Assessment              General  Care Manager assessed the patient by : In person interview with patient, Medical record review, Discussion with Clinical Care team, In person interview with family (met with pt and sister Jessica Wilson)  Orientation Level: Oriented X4    Contact/Decision Maker:    Contact Details  Contact Details: Primary Contact, Secondary Contact  Primary Contact Name: Jessica Wilson  Primary Contact Relationship: Spouse  Phone #1: c: [phone]  Phone #2: [phone] home  Secondary Contact Name: Jessica Wilson  Secondary Contact Relationship: Sibling  Phone #3: c: [phone]    Advance Directive (Medical Treatment)  Does patient have an advance directive covering medical treatment?: Patient does not have advance directive covering medical treatment., Patient would not like information.  Advance directive covering medical treatment not in Chart:: Copy requested from family  Reason patient does not have an advance directive covering medical treatment:: Patient needs follow-up to complete one.  Surrogate decision maker appointed::  (pt has paperwork for will, living will and HCPOA at home, but it isn't notarized.  I told her how to contact patient relations to notarize the HCPOA part if she brings it)  Pt wants husband to be HCPOA.  Information provided on advance directive:: No  Patient requests assistance:: No         Patient Information:    Lives with: Spouse/significant other    Type of Residence: Private residence       Type of Residence: Mailing Address:  [address]  [place] [address]  Contacts: Accompanied by: Alone  Contact Details: Primary Contact, Secondary Contact  Primary Contact Name: Jessica Wilson  Primary Contact Relationship: Spouse  Phone #1: c: [phone]  Phone #2: [phone] home  Secondary Contact Name: Jessica Wilson  Secondary Contact Relationship: Sibling  Phone #3: c: [phone]  Patient Phone Number: c: [phone], h: [phone]        Medical Provider(s): SYED Franchot Mimes, MD  Reason for Admission: Admitting Diagnosis:  AML  Past Medical History:   has a past medical history of Anxiety disorder; Chronic kidney disease; Depression; Diabetes mellitus without complication (CMS-HCC); Fibrocystic breast; GERD (gastroesophageal reflux disease); Hyperlipidemia; Hypertension; MDS (myelodysplastic syndrome), high grade (CMS-HCC) (04/17/2016); Nephrolithiasis; Obesity; and Sleep apnea.  Past Surgical History:   has a past surgical history that includes Hysterectomy (age 63); Excision / biopsy breast / nipple / duct (Right, Age 86); Cardiac catheterization; Cholecystectomy (02/24/2015); and Diagnostic laparoscopy.   Previous admit date: N/A    Editor, commissioning- Payor: Advertising copywriter MEDICARE ADV / Plan: Advertising copywriter MEDICARE ADV / Product Type: *No Product type* /   Secondary Insurance ??? None  Prescription Coverage ??? medicare  Preferred Pharmacy - [id number] - [place], [place] - [address]  [facility] - [place], [place] - [address]    Transportation home: Private vehicle  Level of function prior to admission: Requires Assistance             Support Systems: Spouse, Family Members (husband and sister)    Responsibilities/Dependents at home?: No    Home Care services in place prior to admission?: No                  Equipment Currently Used at Home: none       Currently receiving  outpatient dialysis?: No       Financial Information:     Patient source of income: social security    Need for financial assistance?: No       Discharge Needs Assessment:    Concerns to be Addressed: adjustment to diagnosis/illness    Clinical Risk Factors: Principal Diagnosis: Cancer, Stroke, COPD, Heart Failure, AMI, Pneumonia, Joint Replacment, > 65, Functional Limitations    Barriers to taking medications: No    Prior overnight hospital stay or ED visit in last 90 days: No    Readmission Within the Last 30 Days: no previous admission in last 30 days         Anticipated Changes Related to Illness: none    Equipment Needed After Discharge:  (TBD)    Discharge Facility/Level of Care Needs:  (TBD)    Patient at risk for readmission?: No    Discharge Plan:     TBD, likely home    Expected Discharge Date: 07/24/17      Patient and/or family were provided with choice of facilities / services that are available and appropriate to meet post hospital care needs?: Yes   List choices in order highest to lowest preferred, if applicable. : no preference if [facility] or DME is needed    Initial Assessment complete?: Yes

## 2017-07-22 NOTE — Unmapped (Signed)
Problem: Patient Care Overview  Goal: Plan of Care Review  Outcome: Progressing  Patients pain is tolerable with pain medication and is usually in the lower left quad.  Awaiting bone biopsy result and plan of care.  Spouse at bedside and very supportive to patient.  Will continue with present plan of care and notify MD of changes.    Problem: Pain, Acute (Adult)  Goal: Acceptable Pain Control/Comfort Level  Patient will demonstrate the desired outcomes by discharge/transition of care.   Outcome: Progressing

## 2017-07-22 NOTE — Unmapped (Signed)
Problem: Patient Care Overview  Goal: Plan of Care Review  Outcome: Progressing  Pt aox4, vss, nad, resting well with sister at bedside. Up with assist to [facility]. ACHS + AC. Pain well controlled with PRN meds. Tolerating diet well. Bone marrow bx today, tolerated well. Denies needs at this time. Bed locked in low position, call bell in reach, encouraged to call. Will continue to monitor.   Goal: Discharge Needs Assessment  Outcome: Progressing   07/20/17 0631 07/21/17 1554   OTHER   Equipment Currently Used at Home --  none   Patient and/or family were provided with choice of facilities / services that are available and appropriate to meet post hospital care needs? --  Yes   Discharge Needs Assessment   Concerns to be Addressed --  adjustment to diagnosis/illness   Readmission Within the Last 30 Days --  no previous admission in last 30 days   Patient/Family Anticipates Transition to home;home with family --    Patient/Family Anticipated Services at Transition hospice care --    Transportation Anticipated none --    Anticipated Changes Related to Illness --  none   Equipment Needed After Discharge --  (TBD)   Discharge Facility/Level of Care Needs --  (TBD)   Current Discharge Risk chronically ill --      Goal: Interprofessional Rounds/Family Conf  Outcome: Progressing      Problem: Fall Risk (Adult)  Goal: Identify Related Risk Factors and Signs and Symptoms  Related risk factors and signs and symptoms are identified upon initiation of Human Response Clinical Practice Guideline (CPG).   Outcome: Resolved Date Met: 07/21/17   07/21/17 0419   Fall Risk (Adult)   Related Risk Factors (Fall Risk) gait/mobility problems;environment unfamiliar   Signs and Symptoms (Fall Risk) presence of risk factors     Goal: Absence of Fall  Patient will demonstrate the desired outcomes by discharge/transition of care.   Outcome: Progressing      Problem: Pain, Acute (Adult)  Goal: Identify Related Risk Factors and Signs and Symptoms Related risk factors and signs and symptoms are identified upon initiation of Human Response Clinical Practice Guideline (CPG).   Outcome: Resolved Date Met: 07/21/17   07/21/17 0419   Pain, Acute (Adult)   Related Risk Factors (Acute Pain) disease process   Signs and Symptoms (Acute Pain) verbalization of pain descriptors     Goal: Acceptable Pain Control/Comfort Level  Patient will demonstrate the desired outcomes by discharge/transition of care.   Outcome: Progressing      Problem: VTE, DVT and PE (Adult)  Goal: Signs and Symptoms of Listed Potential Problems Will be Absent, Minimized or Managed (VTE, DVT and PE)  Signs and symptoms of listed potential problems will be absent, minimized or managed by discharge/transition of care (reference VTE, DVT and PE (Adult) CPG).   Outcome: Progressing      Problem: Oncology Care (Adult)  Goal: Signs and Symptoms of Listed Potential Problems Will be Absent, Minimized or Managed (Oncology Care)  Signs and symptoms of listed potential problems will be absent, minimized or managed by discharge/transition of care (reference Oncology Care (Adult) CPG).   Outcome: Progressing

## 2017-07-23 DIAGNOSIS — C9200 Acute myeloblastic leukemia, not having achieved remission: Principal | ICD-10-CM

## 2017-07-23 LAB — CMV DNA, QUANTITATIVE, PCR

## 2017-07-23 LAB — CMV VIRAL LD: Lab: NOT DETECTED

## 2017-07-23 LAB — COMPREHENSIVE METABOLIC PANEL
ALBUMIN: 3.3 g/dL — ABNORMAL LOW (ref 3.5–5.0)
ALKALINE PHOSPHATASE: 122 U/L (ref 38–126)
ALT (SGPT): 76 U/L — ABNORMAL HIGH (ref 15–48)
ANION GAP: 10 mmol/L (ref 9–15)
AST (SGOT): 29 U/L (ref 14–38)
BILIRUBIN TOTAL: 0.7 mg/dL (ref 0.0–1.2)
BLOOD UREA NITROGEN: 31 mg/dL — ABNORMAL HIGH (ref 7–21)
BUN / CREAT RATIO: 28
CALCIUM: 8.6 mg/dL (ref 8.5–10.2)
CHLORIDE: 101 mmol/L (ref 98–107)
CREATININE: 1.09 mg/dL — ABNORMAL HIGH (ref 0.60–1.00)
EGFR MDRD AF AMER: 59 mL/min/{1.73_m2} — ABNORMAL LOW (ref >=60)
EGFR MDRD NON AF AMER: 49 mL/min/{1.73_m2} — ABNORMAL LOW (ref >=60)
GLUCOSE RANDOM: 175 mg/dL (ref 65–179)
POTASSIUM: 4.5 mmol/L (ref 3.5–5.0)
PROTEIN TOTAL: 6.5 g/dL (ref 6.5–8.3)
SODIUM: 137 mmol/L (ref 135–145)

## 2017-07-23 LAB — CBC W/ AUTO DIFF
HEMATOCRIT: 25.9 % — ABNORMAL LOW (ref 36.0–46.0)
HEMOGLOBIN: 8.6 g/dL — ABNORMAL LOW (ref 12.0–16.0)
MEAN CORPUSCULAR HEMOGLOBIN CONC: 33.3 g/dL (ref 31.0–37.0)
MEAN CORPUSCULAR HEMOGLOBIN: 31.2 pg (ref 26.0–34.0)
MEAN PLATELET VOLUME: 13 fL — ABNORMAL HIGH (ref 7.0–10.0)
NUCLEATED RED BLOOD CELLS: 6 /100{WBCs} — ABNORMAL HIGH (ref <=4)
RED BLOOD CELL COUNT: 2.76 10*12/L — ABNORMAL LOW (ref 4.00–5.20)
RED CELL DISTRIBUTION WIDTH: 21.2 % — ABNORMAL HIGH (ref 12.0–15.0)
WBC ADJUSTED: 8.8 10*9/L (ref 4.5–11.0)

## 2017-07-23 LAB — MANUAL DIFFERENTIAL
BLASTS - REL (DIFF): 13 % (ref <=0)
EOSINOPHILS - ABS (DIFF): 0 10*9/L (ref 0.0–0.4)
LYMPHOCYTES - ABS (DIFF): 1.6 10*9/L (ref 1.5–5.0)
NEUTROPHILS - ABS (DIFF): 4.7 10*9/L (ref 2.0–7.5)

## 2017-07-23 LAB — ANISOCYTOSIS

## 2017-07-23 LAB — NEUTROPHILS - ABS (DIFF): Lab: 4.7

## 2017-07-23 LAB — CALCIUM: Calcium:MCnc:Pt:Ser/Plas:Qn:: 8.6

## 2017-07-23 NOTE — Unmapped (Signed)
Problem: Patient Care Overview  Goal: Plan of Care Review  Outcome: Progressing  Pt A&Ox4, room air, VS stable. Pt had several breathing treatment during the night. Pt expressed she takes an inhalor daily which is not in the [facility]. Pt complained of SOB but breathing treatments helped some. Will continue monitor.   Goal: Individualization and Mutuality  Outcome: Progressing    Goal: Discharge Needs Assessment  Outcome: Progressing    Goal: Interprofessional Rounds/Family Conf  Outcome: Progressing      Problem: Fall Risk (Adult)  Goal: Absence of Fall  Patient will demonstrate the desired outcomes by discharge/transition of care.   Outcome: Progressing      Problem: Pain, Acute (Adult)  Goal: Acceptable Pain Control/Comfort Level  Patient will demonstrate the desired outcomes by discharge/transition of care.   Outcome: Progressing      Problem: VTE, DVT and PE (Adult)  Goal: Signs and Symptoms of Listed Potential Problems Will be Absent, Minimized or Managed (VTE, DVT and PE)  Signs and symptoms of listed potential problems will be absent, minimized or managed by discharge/transition of care (reference VTE, DVT and PE (Adult) CPG).   Outcome: Progressing      Problem: Oncology Care (Adult)  Goal: Signs and Symptoms of Listed Potential Problems Will be Absent, Minimized or Managed (Oncology Care)  Signs and symptoms of listed potential problems will be absent, minimized or managed by discharge/transition of care (reference Oncology Care (Adult) CPG).   Outcome: Progressing

## 2017-07-23 NOTE — Unmapped (Signed)
Medicine Daily Progress Note    Assessment/Plan:  Principal Problem:    Acute myeloid leukemia not having achieved remission (CMS-HCC)  Resolved Problems:    * No resolved hospital problems. *           Jessica Wilson is a 76 y.o. female former smoker with a PMH of MDS progressed to AML, COPD (not oxygen dependent), T2DM, CAD presenting with progressive LUQ pain with splenomegaly on CT, likely progressive AML.     AML, hx of MDS. Presented with splenomegaly and pancytopenia. BMBx here on 11/12 showing increase in blasts from 35% (most recent) to 62%. Marrow results represent progression of her AML, and so unlikely differentiation syndrome. However, still sorting out whether this is drug toxicity and/or whether its too soon to see a response from the drug which she started a couple weeks ago.   - restarting enasidenib today as unlikely differentiation syndrome  - acyclovir 400 mg BID   - f/u CMV, EBV, cytogenetics     LLQ pain, Splenomegaly. Several days of worsening LUQ pain. Found to have splenomegaly on OSH CT scan. Unsure whether this is unusual form of drug toxicity. Having our radiology team review OSH films.   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 Elevated UA, LDH, and K, on admission. Unlikely to be TLS given AML.   - discontinued allopurinol 300 mg daily as TLS labs improved    AKI - Resolved. Cr 1.4 on admission, now at her baseline ~1.1.   Transaminitis. LFTs elevated on admission, now improving daily. Non-reactive HepB/C. May be 2/2 drug toxicity.     Chronic Medical Problems:   Paroxysmal Afib: Diltiazem 180mg  daily. Followed by [facility].   T2DM with nephropathy: Lispro 7U TID AC, SSI (expect to wean given steroids stopped now)  HTN: restarted Amlodipine 5mg  today  COPD: Home Anoro Ellipta is non-formulary; ordered duonebs, goal O2 sat >88%  Depression: Bupropion 150mg  daily and Sertraline 150mg  daily     Dispo: pending splenomegaly workup  ___________________________________________________________________    Subjective:  Continues to have LLQ pain, worse with coughing  Reports controlled with oxycodone  Small BM yesterday    Labs/Studies:  Labs and Studies from the last 24hrs per EMR and Reviewed    Pressure Ulcer(s)    Active Pressure Ulcer     None                Physical Exam:  VITAL SIGNS: BP 137/61  - Pulse 71  - Temp 36.7 ??C (Oral)  - Resp 20  - Ht 157.5 cm (5' 2.01)  - Wt (!) 106.1 kg (233 lb 14.5 oz)  - SpO2 94%  - BMI 42.77 kg/m??   GENERAL: Well-appearing, NAD   CARDIOVASCULAR: normal rate, regular rhythm, no mrg  RESPIRATORY: CTAB, no wheezing   ABDOMEN: Soft, NT/ND, NABS Generalized discomfort in the LLQ, no rebound, no guarding  EXTREMITIES:  No cyanosis, clubbing or edema.   NEUROLOGIC: Appropriate mood and affect.

## 2017-07-23 NOTE — Unmapped (Signed)
Problem: Patient Care Overview  Goal: Plan of Care Review  Outcome: Progressing

## 2017-07-24 ENCOUNTER — Inpatient Hospital Stay: Payer: Medicare Other

## 2017-07-24 DIAGNOSIS — C9200 Acute myeloblastic leukemia, not having achieved remission: Principal | ICD-10-CM

## 2017-07-24 LAB — CBC W/ AUTO DIFF
HEMATOCRIT: 26.9 % — ABNORMAL LOW (ref 36.0–46.0)
HEMOGLOBIN: 8.9 g/dL — ABNORMAL LOW (ref 12.0–16.0)
MEAN CORPUSCULAR HEMOGLOBIN CONC: 33 g/dL (ref 31.0–37.0)
MEAN CORPUSCULAR HEMOGLOBIN: 31 pg (ref 26.0–34.0)
MEAN CORPUSCULAR VOLUME: 93.7 fL (ref 80.0–100.0)
MEAN PLATELET VOLUME: 8.2 fL (ref 7.0–10.0)
NUCLEATED RED BLOOD CELLS: 5 /100{WBCs} — ABNORMAL HIGH (ref <=4)
PLATELET COUNT: 23 10*9/L — ABNORMAL LOW (ref 150–440)
RED BLOOD CELL COUNT: 2.87 10*12/L — ABNORMAL LOW (ref 4.00–5.20)
WBC ADJUSTED: 9 10*9/L (ref 4.5–11.0)

## 2017-07-24 LAB — COMPREHENSIVE METABOLIC PANEL
ALBUMIN: 3.5 g/dL (ref 3.5–5.0)
ALKALINE PHOSPHATASE: 117 U/L (ref 38–126)
ALT (SGPT): 82 U/L — ABNORMAL HIGH (ref 15–48)
ANION GAP: 11 mmol/L (ref 9–15)
AST (SGOT): 33 U/L (ref 14–38)
BILIRUBIN TOTAL: 1.1 mg/dL (ref 0.0–1.2)
BLOOD UREA NITROGEN: 29 mg/dL — ABNORMAL HIGH (ref 7–21)
BUN / CREAT RATIO: 27
CALCIUM: 8.6 mg/dL (ref 8.5–10.2)
CHLORIDE: 104 mmol/L (ref 98–107)
CO2: 25 mmol/L (ref 22.0–30.0)
CREATININE: 1.06 mg/dL — ABNORMAL HIGH (ref 0.60–1.00)
EGFR MDRD AF AMER: >=60 mL/min/{1.73_m2} (ref >=60)
EGFR MDRD NON AF AMER: 50 mL/min/{1.73_m2} — ABNORMAL LOW (ref >=60)
GLUCOSE RANDOM: 142 mg/dL (ref 65–179)
SODIUM: 140 mmol/L (ref 135–145)

## 2017-07-24 LAB — EBV VIRAL LOAD RESULT: Lab: NOT DETECTED

## 2017-07-24 LAB — MANUAL DIFFERENTIAL
BLASTS - REL (DIFF): 17 % (ref <=0)
LYMPHOCYTES - ABS (DIFF): 2.2 10*9/L (ref 1.5–5.0)
NEUTROPHILS - ABS (DIFF): 3.2 10*9/L (ref 2.0–7.5)

## 2017-07-24 LAB — ANISOCYTOSIS

## 2017-07-24 LAB — NEUTROPHILS - ABS (DIFF): Lab: 3.2

## 2017-07-24 LAB — ALBUMIN: Albumin:MCnc:Pt:Ser/Plas:Qn:: 3.5

## 2017-07-24 MED ORDER — AMLODIPINE BESYLATE 5 MG PO TABS
5.00 mg | ORAL_TABLET | ORAL | Status: DC
Start: 2017-07-25 — End: 2017-07-24

## 2017-07-24 MED ORDER — LOPERAMIDE HCL 2 MG PO CAPS
2.00 mg | ORAL_CAPSULE | ORAL | Status: DC
Start: ? — End: 2017-07-24

## 2017-07-24 MED ORDER — DEXTROSE 50 % IV SOLN
12.50 | INTRAVENOUS | Status: DC
Start: ? — End: 2017-07-24

## 2017-07-24 MED ORDER — ALBUTEROL SULFATE (2.5 MG/3ML) 0.083% IN NEBU
2.50 mg | INHALATION_SOLUTION | RESPIRATORY_TRACT | Status: DC
Start: ? — End: 2017-07-24

## 2017-07-24 MED ORDER — ACYCLOVIR 800 MG PO TABS
400.00 mg | ORAL_TABLET | ORAL | Status: DC
Start: 2017-07-24 — End: 2017-07-24

## 2017-07-24 MED ORDER — POLYETHYLENE GLYCOL 3350 17 G PO PACK
17.00 | PACK | ORAL | Status: DC
Start: 2017-07-25 — End: 2017-07-24

## 2017-07-24 MED ORDER — DIPHENHYDRAMINE HCL 50 MG/ML IJ SOLN
25.00 mg | INTRAMUSCULAR | Status: DC
Start: ? — End: 2017-07-24

## 2017-07-24 MED ORDER — BUPROPION HCL ER (SR) 150 MG PO TB12
150.00 mg | ORAL_TABLET | ORAL | Status: DC
Start: 2017-07-25 — End: 2017-07-24

## 2017-07-24 MED ORDER — GENERIC EXTERNAL MEDICATION
Status: DC
Start: ? — End: 2017-07-24

## 2017-07-24 MED ORDER — ENASIDENIB MESYLATE 100 MG PO TABS
100.00 mg | ORAL_TABLET | ORAL | Status: DC
Start: 2017-07-24 — End: 2017-07-24

## 2017-07-24 MED ORDER — SODIUM CHLORIDE 0.9 % IV SOLN
20.00 | INTRAVENOUS | Status: DC
Start: ? — End: 2017-07-24

## 2017-07-24 MED ORDER — CLONAZEPAM 0.5 MG PO TABS
1.00 mg | ORAL_TABLET | ORAL | Status: DC
Start: ? — End: 2017-07-24

## 2017-07-24 MED ORDER — SODIUM CHLORIDE 0.9 % IV SOLN
1000.00 | INTRAVENOUS | Status: DC
Start: ? — End: 2017-07-24

## 2017-07-24 MED ORDER — EPINEPHRINE 0.3 MG/0.3ML IJ SOAJ
0.30 mg | INTRAMUSCULAR | Status: DC
Start: ? — End: 2017-07-24

## 2017-07-24 MED ORDER — METHYLPREDNISOLONE SODIUM SUCC 125 MG IJ SOLR
125.00 mg | INTRAMUSCULAR | Status: DC
Start: ? — End: 2017-07-24

## 2017-07-24 MED ORDER — ACETAMINOPHEN 325 MG PO TABS
650.00 mg | ORAL_TABLET | ORAL | Status: DC
Start: ? — End: 2017-07-24

## 2017-07-24 MED ORDER — ATORVASTATIN CALCIUM 40 MG PO TABS
40.00 mg | ORAL_TABLET | ORAL | Status: DC
Start: 2017-07-25 — End: 2017-07-24

## 2017-07-24 MED ORDER — INSULIN LISPRO 100 UNIT/ML ~~LOC~~ SOLN
0.00 | SUBCUTANEOUS | Status: DC
Start: 2017-07-24 — End: 2017-07-24

## 2017-07-24 MED ORDER — LOPERAMIDE HCL 2 MG PO CAPS
4.00 mg | ORAL_CAPSULE | ORAL | Status: DC
Start: ? — End: 2017-07-24

## 2017-07-24 MED ORDER — MEPERIDINE HCL 25 MG/ML IJ SOLN
25.00 mg | INTRAMUSCULAR | Status: DC
Start: ? — End: 2017-07-24

## 2017-07-24 MED ORDER — GENERIC EXTERNAL MEDICATION
150.00 mg | Status: DC
Start: 2017-07-25 — End: 2017-07-24

## 2017-07-24 MED ORDER — FAMOTIDINE 20 MG/2ML IV SOLN
20.00 mg | INTRAVENOUS | Status: DC
Start: ? — End: 2017-07-24

## 2017-07-24 MED ORDER — MENTHOL 9.1 MG MT LOZG
1.00 | LOZENGE | OROMUCOSAL | Status: DC
Start: ? — End: 2017-07-24

## 2017-07-24 MED ORDER — GENERIC EXTERNAL MEDICATION
10.00 | Status: DC
Start: ? — End: 2017-07-24

## 2017-07-24 MED ORDER — GUAIFENESIN-DM 100-10 MG/5ML PO SYRP
5.00 | ORAL_SOLUTION | ORAL | Status: DC
Start: ? — End: 2017-07-24

## 2017-07-24 MED ORDER — DILTIAZEM HCL ER COATED BEADS 180 MG PO CP24
180.00 mg | ORAL_CAPSULE | ORAL | Status: DC
Start: 2017-07-25 — End: 2017-07-24

## 2017-07-24 NOTE — Unmapped (Signed)
PHYSICAL THERAPY  Evaluation (07/24/17 1155)     Patient Name:  Jessica Wilson       Medical Record Number: [id number]   Date of Birth: 06/21/1941  Sex: Female            Treatment Diagnosis: deconditioning     ASSESSMENT    Jessica Wilson is a 76 y.o. female former smoker with a PMH of MDS progressed to AML, COPD (not oxygen dependent), T2DM, CAD presenting with progressive LUQ pain with splenomegaly on CT, likely progressive AML. Patient is currently mobilizing at her baseline and does not require any further PT intervention. Patient requesting a rollator for home use to increase support for longer distances. Patient encouraged to continue to ambulate daily with nursing staff. Patient did not voice any questions or concerns at this time and will have husband assistance at home as needed. Based on the [facility] score of 20/20, patient is considered 0% impaired with mobility. No further PT needs.     Today's Interventions: AMPAC 20/20; educated patient in safe AD usage, educated in places to obtain elevated commode seat as needed; home safety for falls prevention and energy conservation, activity pacing, regular activity to prevent deconditioning    Activity Tolerance: Patient tolerated treatment well    PLAN  Planned Frequency of Treatment:  D/C Services for: D/C Services      Planned Interventions:      Post-Discharge Physical Therapy Recommendations:  PT services not indicated        PT DME Recommendations:  (rollator )     Goals:   Patient and Family Goals: I hope to go home today                                                                                                         Prognosis:  Good  Barriers to Discharge: None       SUBJECTIVE  Patient reports: pt in agreement with PT session   Current Functional Status: pt received and ended session supine in bed with all needs in reach at end of session on BMTU, RN Vernona Rieger) cleared session and aware of patient's status   Services patient receives: PT;OT Prior functional status: independent with all mobility without an AD   Equipment available at home: Shower chair    Past Medical History:   Diagnosis Date   ??? Anxiety disorder    ??? Chronic kidney disease    ??? Depression    ??? Diabetes mellitus without complication (CMS-HCC)    ??? Fibrocystic breast    ??? GERD (gastroesophageal reflux disease)    ??? Hyperlipidemia    ??? Hypertension    ??? MDS (myelodysplastic syndrome), high grade (CMS-HCC) 04/17/2016   ??? Nephrolithiasis    ??? Obesity    ??? Sleep apnea     Social History   Substance Use Topics   ??? Smoking status: Former Smoker     Types: Cigarettes     Quit date: 09/09/1988   ??? Smokeless tobacco: Former Neurosurgeon     Quit date: 06/05/1991   ???  Alcohol use No      Past Surgical History:   Procedure Laterality Date   ??? CARDIAC CATHETERIZATION     ??? CHOLECYSTECTOMY  02/24/2015   ??? DIAGNOSTIC LAPAROSCOPY      removal of benign abdominal tumor   ??? EXCISION / BIOPSY BREAST / NIPPLE / DUCT Right Age 27   ??? HYSTERECTOMY  age 16    Family History   Problem Relation Age of Onset   ??? Heart failure Mother    ??? Diabetes Mother    ??? Coronary artery disease Mother    ??? Cirrhosis Father         Allergies: Macrobid [nitrofurantoin monohyd/m-cryst]                Objective Findings              Precautions: Bleeding precautions              Weight Bearing Status: Non- applicable              Required Braces or Orthoses: Non- applicable    Communication Preference: Verbal  Pain Comments: denies pain   Medical Tests / Procedures: reviewed in EPIC   Equipment / Environment: Vascular access (PIV, TLC, Port-a-cath, PICC)    At Rest: VSS  With Activity: VSS  Orthostatics: asymptomatic throughout session        Living environment: House  Lives With: Spouse  Home Living: One level home;Ramped entrance;Walk-in shower             Cognition: AxO x4          UE ROM: WFL  UE Strength: WFL  LE ROM: WFL  LE Strength: WFL                          Balance: sitting balance independent; static standing balance independent; ambulation with use of RW         Bed Mobility: mod indep with use of railing supine to and from sit transfer   Transfers: sit to and from stand indep without an AD; no loss of balance    Gait: ambulation x300 feet with RW; pt demonstrated the ability to ambulate x25 feet without a RW with no loss of balance however stating I feel more comfortable when I have something to hold onto; pt with guarded gait pattern when not using RW    Stairs: N/A; patient has ramp entrance into the home            Eval Duration(PT): 25 Min.       PT G-Codes  Functional Assessment Tool Used: AMPAC  Score: 20/20  Functional Limitation: Mobility: Walking and moving around  Mobility: Walking and Moving Around Current Status [phone]): 0 percent impaired, limited or restricted  Mobility: Walking and Moving Around Goal Status [phone]): 0 percent impaired, limited or restricted  Mobility: Walking and Moving Around Discharge Status [phone]): 0 percent impaired, limited or restricted  I attest that I have reviewed the above information.  Signed: Felipa Emory, PT  Filed 07/24/2017

## 2017-07-24 NOTE — Unmapped (Signed)
Problem: Patient Care Overview  Goal: Plan of Care Review  Outcome: Progressing    Goal: Individualization and Mutuality  Outcome: Progressing

## 2017-07-24 NOTE — Unmapped (Signed)
Problem: Patient Care Overview  Goal: Plan of Care Review  Outcome: Progressing  Patient VSS and remained afebrile. ACHS. Pt received pt supplied oral chemo. Pt received prn albuterol. Will continue to monitor.   Goal: Individualization and Mutuality  Outcome: Progressing    Goal: Discharge Needs Assessment  Outcome: Progressing    Goal: Interprofessional Rounds/Family Conf  Outcome: Progressing      Problem: Pain, Acute (Adult)  Goal: Acceptable Pain Control/Comfort Level  Patient will demonstrate the desired outcomes by discharge/transition of care.   Outcome: Progressing      Problem: VTE, DVT and PE (Adult)  Goal: Signs and Symptoms of Listed Potential Problems Will be Absent, Minimized or Managed (VTE, DVT and PE)  Signs and symptoms of listed potential problems will be absent, minimized or managed by discharge/transition of care (reference VTE, DVT and PE (Adult) CPG).   Outcome: Progressing      Problem: Oncology Care (Adult)  Goal: Signs and Symptoms of Listed Potential Problems Will be Absent, Minimized or Managed (Oncology Care)  Signs and symptoms of listed potential problems will be absent, minimized or managed by discharge/transition of care (reference Oncology Care (Adult) CPG).   Outcome: Progressing

## 2017-07-25 NOTE — Unmapped (Signed)
Physician Discharge Summary    Identifying Information:   Jessica Wilson  1940-12-26  [id number]    Admit date: 07/19/2017    Discharge date: 07/24/2017     Discharge Service: Oncology/Hematology (MDE)    Discharge Attending Physician: No att. providers found    Discharge to: Home    Discharge Diagnoses:  Principal Problem:    Acute myeloid leukemia not having achieved remission (CMS-HCC)  Resolved Problems:    * No resolved hospital problems. *      Outpatient Provider Follow Up Issues:   -Recheck platelet count     Hospital Course:   Jessica Wilson is a 76 y.o. female with a PMH of MDS progressed to AML, COPD, DM, CAD who had progression of her AML.She presented to OSH with LLQ pain and found to have splenomegaly, transaminitis, and pancytopenia. Her bone marrow biopsy on 11/12 showed an increase in blasts from 35% to 62%. However, given that she recently started endasidenib 06/28/2017, it may be too soon to see a response. She was restarted on endasidenib at discharge.     LLQ pain, Splenomegaly  Several days of worsening abdominal pain in the setting of splenomegaly on CT scan. Pain improved with small doses of oxycodone. At the moment, unclear etiology for splenomegaly. Recommended outpatient follow-up if symptoms worsen.   ??  AML, hx of MDS  Follows with Dr. Malen Gauze. Diagnosed with MDS in 04/2016 and treated with 9 cycles of azacitadine. On 05/2017, there was concern for increasing transfusion requirements so she underwent bone marrow biopsy showing progression to AML with 35% blasts and was started on enasidenib 06/28/2017.     Low-grade hemoptysis. Patient with several days of low-grade cough with hemoptysis, no fever. Repeat CXR unrevealing. Likely secondary to thrombocytopenia (platelets 23). Transfused 1U platelets at discharge. Provided strict return precautions should hemoptysis not worsen/fail to improve. She verbalized understanding.     Acute Kidney Injury. Cr 1.4 on admission with baseline ~1.1. Creatinine improved to 1.16    Transaminitis. Elevated on admission. Non-reactive HepB/C.  Suspect related to chemotherapy. LFTs significantly improved by discharge.   ??  Chronic Medical Problems:   Paroxysmal Afib: Diltiazem 180mg  daily. Followed by [facility].   T2DM with nephropathy: Metformin resumed at discharge  HTN:  Amlodipine 5mg  today  COPD: Anoro Ellipta  Depression: Bupropion 150mg  daily and Sertraline 150mg  daily   ??  Procedures:  biopsy: bone marrow 11/12  ______________________________________________________________________    Discharge Day Services:  BP 135/60  - Pulse 67  - Temp 36.4 ??C  - Resp 18  - Ht 157.5 cm (5' 2.01)  - Wt (!) 106.1 kg (233 lb 14.5 oz)  - SpO2 95%  - BMI 42.77 kg/m??   Pt seen on the day of discharge and determined appropriate for discharge.    Condition at Discharge: stable  ______________________________________________________________________  Discharge Medications:     Your Medication List      CONTINUE taking these medications    acetaminophen 325 MG tablet  Commonly known as:  TYLENOL  Take 650 mg by mouth every six (6) hours as needed for pain.     acyclovir 400 MG tablet  Commonly known as:  ZOVIRAX  Take 400 mg by mouth Two (2) times a day.     allopurinol 300 MG tablet  Commonly known as:  ZYLOPRIM  TAKE 1 TABLET(300 MG) BY MOUTH DAILY     amLODIPine 5 MG tablet  Commonly known as:  NORVASC  Take 1  tablet (5 mg total) by mouth daily.     ANORO ELLIPTA 62.5-25 mcg/actuation inhaler  Generic drug:  umeclidinium-vilanterol  Inhale 1 puff daily.     buPROPion 100 MG tablet  Commonly known as:  WELLBUTRIN  Take 300 mg by mouth daily at 0600.     CARTIA XT 180 MG 24 hr capsule  Generic drug:  diltiazem     clonazePAM 0.5 MG tablet  Commonly known as:  KlonoPIN  Take 1 mg by mouth two (2) times a day as needed for anxiety.     enasidenib 100 mg tablet  Commonly known as:  IDHIFA  Take 1 tablet (100 mg total) by mouth daily.     metFORMIN 500 MG tablet  Commonly known as:  GLUCOPHAGE  Take 500 mg by mouth nightly.     rosuvastatin 20 MG tablet  Commonly known as:  CRESTOR  Take 20 mg by mouth daily.     sertraline 100 MG tablet  Commonly known as:  ZOLOFT  Take 150 mg by mouth daily.     traMADol 50 mg tablet  Commonly known as:  ULTRAM  Take 1/2 tablet (25 mg) one to two times daily as needed for pain.          ______________________________________________________________________  Pending Test Results (if blank, then none):   Order Current Status    Cytogenetics AP Order In process          Most Recent Labs:  Microbiology Results (last day)     Procedure Component Value Date/Time Date/Time    Lower Respiratory Culture [[id number]]     Lab Status:  No result Specimen:  Sputum from SPUTUM EXPECTORATED           Lab Results   Component Value Date    WBC 9.0 07/24/2017    HGB 8.9 (L) 07/24/2017    HCT 26.9 (L) 07/24/2017    PLT 23 (L) 07/24/2017       Lab Results   Component Value Date    NA 140 07/24/2017    K 3.8 07/24/2017    CL 104 07/24/2017    CO2 25.0 07/24/2017    BUN 29 (H) 07/24/2017    CREATININE 1.06 (H) 07/24/2017    CALCIUM 8.6 07/24/2017    MG 2.1 07/20/2017    PHOS 4.4 07/20/2017       Lab Results   Component Value Date    ALKPHOS 117 07/24/2017    BILITOT 1.1 07/24/2017    PROT 6.8 07/24/2017    ALBUMIN 3.5 07/24/2017    ALT 82 (H) 07/24/2017    AST 33 07/24/2017       Lab Results   Component Value Date    PT 12.4 07/19/2017    INR 1.09 07/19/2017    APTT 33.6 07/19/2017     Hospital Radiology:  Xr Chest 2 Views    Result Date: 07/19/2017  EXAM: XR CHEST 2 VIEWS DATE: 07/19/2017 4:34 PM ACCESSION: [id number] UN DICTATED: 07/19/2017 5:25 PM INTERPRETATION LOCATION: [facility] CLINICAL INDICATION: 76 years old Female with DYSPNEA--  COMPARISON:  02/06/2002   TECHNIQUE:   PA and lateral views of the chest  FINDINGS: Parenchymal densities in the left midlung zone suggests scarring or volume loss.  Irregular calcifications overlying left chest wall are unchanged. Cardiac and mediastinal silhouettes are within normal limits.  There is no pneumothorax or evidence for failure, pleural effusion or acute airspace consolidation otherwise.      Parenchymal densities in the  left midlung zone suggesting volume loss or scarring.    Xr Outside Film For Continued Care    Result Date: 07/19/2017  These images were imported for continued care purposes only. They will not be interpreted and no charges will apply.     Ct Body Outside Film For Continued Care    Addendum Date: 07/23/2017    Report Replacement: The images for the original accession number [id number] Bethann Humble  are now associated with [id number] UN . dmk      Result Date: 07/23/2017  These images were imported for continued care purposes only. They will not be interpreted and no charges will apply.       ______________________________________________________________________    Discharge Instructions:   Activity Instructions     Activity as tolerated             Diet Instructions     Discharge diet (specify)       Discharge Nutrition Therapy:  General              Follow Up instructions and Outpatient Referrals     Call MD for:  difficulty breathing, headache or visual disturbances       Call MD for:  extreme fatigue       Call MD for:  persistent dizziness or light-headedness       Call MD for:  persistent nausea or vomiting       Call MD for:  redness, tenderness, or signs of infection (pain, swelling, redness, odor or green/yellow discharge around incision site)       Call MD for:  severe uncontrolled pain       Call MD for:  temperature >38.5 Celsius       Discharge instructions       You were hospitalized for progression of your AML. We resumed your Enasidenib [facility]). You will have your platelets checked on your next lab check visit. Please follow-up with your doctor or return to ED if you develop worsening cough with blood or shortness of breath. We continued your metformin at discharge, but if you are not supposed to be taking this medication please stop taking it.               Appointments which have been scheduled for you    Jul 29, 2017 11:45 AM EST  (Arrive by 11:15 AM)  LAB ONLY [place] with ADULT ONC LAB  [facility] ADULT ONCOLOGY LAB DRAW STATION [facility] REGION) [address]  [place] [address]  [phone]   Jul 29, 2017  1:00 PM EST  (Arrive by 12:30 PM)  RETURN ACTIVE [place] with Thyra Breed, NP  [facility] HEMATOLOGY ONCOLOGY 2ND FLR CANCER HOSP [facility] REGION) [address]  [place] [address]  [phone]   Aug 04, 2017  7:30 AM EST  (Arrive by 7:00 AM)  LAB ONLY [place] with ADULT ONC LAB  [facility] ADULT ONCOLOGY LAB DRAW STATION [place] REGION) [address]  [place] [address]  [phone]   Aug 04, 2017  8:30 AM EST  (Arrive by 8:00 AM)  RETURN ACTIVE [place] with Guerry Bruin, MD  [facility] HEMATOLOGY ONCOLOGY 2ND FLR CANCER HOSP [facility]) [address]  [place] [address]  [phone]          Length of Discharge: I spent greater than 30 mins in the discharge of this patient.

## 2017-07-28 ENCOUNTER — Other Ambulatory Visit: Payer: Self-pay | Admitting: *Deleted

## 2017-07-28 ENCOUNTER — Inpatient Hospital Stay (HOSPITAL_BASED_OUTPATIENT_CLINIC_OR_DEPARTMENT_OTHER): Payer: Medicare Other | Admitting: Internal Medicine

## 2017-07-28 ENCOUNTER — Inpatient Hospital Stay: Payer: Medicare Other

## 2017-07-28 VITALS — BP 133/74 | HR 76 | Resp 16 | Wt 228.0 lb

## 2017-07-28 DIAGNOSIS — R740 Nonspecific elevation of levels of transaminase and lactic acid dehydrogenase [LDH]: Secondary | ICD-10-CM | POA: Diagnosis not present

## 2017-07-28 DIAGNOSIS — Z881 Allergy status to other antibiotic agents status: Secondary | ICD-10-CM

## 2017-07-28 DIAGNOSIS — Z87891 Personal history of nicotine dependence: Secondary | ICD-10-CM | POA: Diagnosis not present

## 2017-07-28 DIAGNOSIS — K219 Gastro-esophageal reflux disease without esophagitis: Secondary | ICD-10-CM | POA: Diagnosis not present

## 2017-07-28 DIAGNOSIS — D649 Anemia, unspecified: Secondary | ICD-10-CM

## 2017-07-28 DIAGNOSIS — R231 Pallor: Secondary | ICD-10-CM | POA: Diagnosis not present

## 2017-07-28 DIAGNOSIS — E785 Hyperlipidemia, unspecified: Secondary | ICD-10-CM | POA: Diagnosis not present

## 2017-07-28 DIAGNOSIS — Z9049 Acquired absence of other specified parts of digestive tract: Secondary | ICD-10-CM

## 2017-07-28 DIAGNOSIS — Z79899 Other long term (current) drug therapy: Secondary | ICD-10-CM | POA: Diagnosis not present

## 2017-07-28 DIAGNOSIS — Z792 Long term (current) use of antibiotics: Secondary | ICD-10-CM | POA: Diagnosis not present

## 2017-07-28 DIAGNOSIS — N183 Chronic kidney disease, stage 3 (moderate): Secondary | ICD-10-CM

## 2017-07-28 DIAGNOSIS — E669 Obesity, unspecified: Secondary | ICD-10-CM

## 2017-07-28 DIAGNOSIS — M1990 Unspecified osteoarthritis, unspecified site: Secondary | ICD-10-CM | POA: Diagnosis not present

## 2017-07-28 DIAGNOSIS — Z794 Long term (current) use of insulin: Secondary | ICD-10-CM

## 2017-07-28 DIAGNOSIS — C9202 Acute myeloblastic leukemia, in relapse: Secondary | ICD-10-CM

## 2017-07-28 DIAGNOSIS — E1122 Type 2 diabetes mellitus with diabetic chronic kidney disease: Secondary | ICD-10-CM | POA: Diagnosis not present

## 2017-07-28 DIAGNOSIS — R161 Splenomegaly, not elsewhere classified: Secondary | ICD-10-CM

## 2017-07-28 DIAGNOSIS — I129 Hypertensive chronic kidney disease with stage 1 through stage 4 chronic kidney disease, or unspecified chronic kidney disease: Secondary | ICD-10-CM | POA: Diagnosis not present

## 2017-07-28 LAB — PREPARE RBC (CROSSMATCH)

## 2017-07-28 LAB — CBC WITH DIFFERENTIAL/PLATELET
BASOS ABS: 0 10*3/uL (ref 0–0.1)
Basophils Relative: 0 %
EOS ABS: 0 10*3/uL (ref 0–0.7)
EOS PCT: 0 %
HCT: 23.4 % — ABNORMAL LOW (ref 35.0–47.0)
HEMOGLOBIN: 7.9 g/dL — AB (ref 12.0–16.0)
LYMPHS PCT: 49 %
Lymphs Abs: 2.9 10*3/uL (ref 1.0–3.6)
MCH: 31.9 pg (ref 26.0–34.0)
MCHC: 33.6 g/dL (ref 32.0–36.0)
MCV: 94.7 fL (ref 80.0–100.0)
MONOS PCT: 8 %
MYELOCYTES: 2 %
Metamyelocytes Relative: 2 %
Monocytes Absolute: 0.5 10*3/uL (ref 0.2–0.9)
Neutro Abs: 2.6 10*3/uL (ref 1.4–6.5)
Neutrophils Relative %: 39 %
PLATELETS: 31 10*3/uL — AB (ref 150–440)
RBC: 2.47 MIL/uL — AB (ref 3.80–5.20)
RDW: 20.7 % — AB (ref 11.5–14.5)
WBC: 6 10*3/uL (ref 3.6–11.0)
nRBC: 1 /100 WBC — ABNORMAL HIGH

## 2017-07-28 LAB — SAMPLE TO BLOOD BANK

## 2017-07-28 MED ORDER — SODIUM CHLORIDE 0.9 % IV SOLN
250.0000 mL | Freq: Once | INTRAVENOUS | Status: AC
  Administered 2017-07-28: 250 mL via INTRAVENOUS
  Filled 2017-07-28: qty 250

## 2017-07-28 MED ORDER — ACETAMINOPHEN 325 MG PO TABS
650.0000 mg | ORAL_TABLET | Freq: Once | ORAL | Status: AC
  Administered 2017-07-28: 650 mg via ORAL
  Filled 2017-07-28: qty 2

## 2017-07-28 MED ORDER — DIPHENHYDRAMINE HCL 25 MG PO CAPS
25.0000 mg | ORAL_CAPSULE | Freq: Once | ORAL | Status: AC
  Administered 2017-07-28: 25 mg via ORAL
  Filled 2017-07-28: qty 1

## 2017-07-28 NOTE — Progress Notes (Signed)
Teresa Carrillo NOTE  Patient Care Team: Roselee Nova, [name]  CHIEF COMPLAINTS/PURPOSE OF CONSULTATION:   Oncology History   #JUNE 2017- Severe neutropenia/ Anemia ~hb 9/platlets- 85-100 AUG 2017- REFRACTORY ANEMIA with EXCESS BLASTS [14% blasts- BMBx]; cytogenetics/FISH-N [SNP micorarray- not done]   # AUG 21st 2017-  START AZA '75mg'$ /m2 day- 1-7 q 28 days x4 cycles; DEC 6th- BMBx- <5% blasts; hypercellular with dysplasia.   # SEP 20th 2018- ACUTE MYELOID LEUKEMIA [38% blasts on BMBx]; IDH-MUTATION PRESENT; low FLT-3; OCT 18th 2018- ENASIDENIB [Dr.Foster; [facility]]   # CKD stage III  ------------------------------------------------------   MOLECULAR TESTING: NGS sent 06/2017 -FLT3-ITD <0.05 mutational burden -IDH2 -DNMT3A --treatment options include FLT3 inhibition and IDH2 inhibition; given low level UYQ0-HKV allelic ratio [admittedly in peripheral blood, not bone marrow]- started on ENASIDENIB     MDS (myelodysplastic syndrome), high grade ([place])   04/23/2016 Initial Diagnosis    MDS (myelodysplastic syndrome), high grade (HCC)       Acute myeloid leukemia in relapse (HCC)  At    HISTORY OF PRESENTING ILLNESS:  Teresa Carrillo 76 y.o.  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  Patient was recently admitted to the hospital for worsening left upper quadrant abdominal pain-CT scan showed splenomegaly without any splenic infarcts.  Patient was transferred to [facility] further evaluation and recommendations.  Patient had a bone marrow biopsy-that unfortunately showed increase in the blast.  The patient was started on steroids-with concerns for differentiation syndrome.  However this was discontinued.  She was discharged from hospital at [facility] last week.  Patient received platelet transfusion prior to discharge.  Patient has been re-started on  Enasidenib. She is tolerating the pill fairly well. No nausea no vomiting or diarrhea.  She does complain of moderate to severe fatigue. Denies any fevers or chills. Shortness of breath or cough. Denies any gum bleeding.   ROS: A complete 10 point review of system is done which is negative except mentioned above in history of present illness.  MEDICAL HISTORY:  Past Medical History:  Diagnosis Date  . Abdominal wall mass   . Anginal pain ([place])   . Anxiety   . Arthritis   . Calculus of kidney   . Cystitis   . Depression   . Diabetes mellitus without complication (HCC)    elevated A1c  . Dyspnea on exertion   . Elevated serum creatinine   . Fibrocystic breast disease   . GERD (gastroesophageal reflux disease)   . Hearing loss   . Heart murmur   . HTN (hypertension)   . Hyperlipidemia   . MDS (myelodysplastic syndrome), high grade ([place]) 04/23/2016  . Microscopic hematuria   . Mouth sores   . Mucositis due to chemotherapy   . Obesity   . Risk for falls   . Sleep apnea   . Thrombocytopenia ([place])   . Urinary frequency   . Urinary urgency     SURGICAL HISTORY: Past Surgical History:  Procedure Laterality Date  . ABDOMINAL HYSTERECTOMY    . APPENDECTOMY    . CARDIAC CATHETERIZATION     x2  . CHOLECYSTECTOMY    . COLONOSCOPY N/A 02/24/2015   Performed by Manya Silvas, MD at [place]  . DIAGNOSTIC LAPAROSCOPY     Removal of benign abdominal tumor  . ESOPHAGOGASTRODUODENOSCOPY (EGD) N/A 02/24/2015   Performed by Gaylyn Cheers  T, MD at [place]  . right eye surgery Right     SOCIAL HISTORY: lives with family; [employer]; [employer] in [place]. No smoking/ no alcohol.  Social History   Socioeconomic History  . Marital status: Married    Spouse name: Not on file  . Number of children: Not on file  . Years of education: Not on file  . Highest education level: Not on file  Social Needs  . Financial resource strain: Not on file  . Food insecurity -  worry: Not on file  . Food insecurity - inability: Not on file  . Transportation needs - medical: Not on file  . Transportation needs - non-medical: Not on file  Occupational History  . Not on file  Tobacco Use  . Smoking status: Former Smoker    Types: Cigarettes    Last attempt to quit: 09/09/1988    Years since quitting: 28.9  . Smokeless tobacco: Never Used  . Tobacco comment: quit 25 years ago  Substance and Sexual Activity  . Alcohol use: No    Alcohol/week: 0.0 oz  . Drug use: No  . Sexual activity: Not Currently    Birth control/protection: Post-menopausal  Other Topics Concern  . Not on file  Social History Narrative  . Not on file    FAMILY HISTORY: no cancers in family.  Family History  Problem Relation Age of Onset  . Congestive Heart Failure Mother   . Diabetes Mother   . Coronary artery disease Mother   . Stroke Mother   . Cirrhosis Father     ALLERGIES:  is allergic to macrobid [nitrofurantoin monohyd macro].  MEDICATIONS:  Current Outpatient Medications  Medication Sig Dispense Refill  . albuterol (PROVENTIL HFA) 108 (90 Base) MCG/ACT inhaler Inhale into the lungs.    Marland Kitchen buPROPion (WELLBUTRIN XL) 150 MG 24 hr tablet Take 300 mg by mouth daily at 12 noon.     . clonazePAM (KLONOPIN) 0.5 MG tablet Take 0.5 mg by mouth 2 (two) times daily.     Marland Kitchen diltiazem (CARDIZEM CD) 180 MG 24 hr capsule Take 1 capsule (180 mg total) by mouth daily. 30 capsule 0  . hydrocortisone (ANUSOL-HC) 2.5 % rectal cream Place 1 application rectally 2 (two) times daily as needed for hemorrhoids or itching. 30 g 0  . insulin aspart (NOVOLOG) 100 UNIT/ML injection Inject 0-9 Units 3 (three) times daily with meals into the skin. 10 mL 11  . magic mouthwash w/lidocaine SOLN Take 5 mLs by mouth 4 (four) times daily. 80 ml viscous lidocaine 2%, 80 ml Mylanta, 80 ml Diphenhydramine 12.5 mg/5 ml Elixir, 80 ml Nystatin 100,000 Unit suspension, 80 ml Prednisolone 15 mg/34m, 80 ml Distilled Water.   Sig: Swish/Swallow 5-10 ml four times a day as needed. Dispense 480 ml. 3RFs 480 mL 3  . midodrine (PROAMATINE) 10 MG tablet Take 1 tablet (10 mg total) 3 (three) times daily with meals by mouth.    . ondansetron (ZOFRAN-ODT) 4 MG disintegrating tablet Take 4 mg by mouth every 8 (eight) hours as needed for nausea or vomiting.    .Marland KitchenoxyCODONE (OXY IR/ROXICODONE) 5 MG immediate release tablet Take 1 tablet (5 mg total) every 6 (six) hours as needed by mouth for moderate pain. 30 tablet 0  . polyethylene glycol (MIRALAX / GLYCOLAX) packet Take 17 g by mouth daily as needed for mild constipation. 14 each 1  . prochlorperazine (COMPAZINE) 10 MG tablet Take 1 tablet (10 mg total) by mouth every  6 (six) hours as needed (Nausea or vomiting). 30 tablet 1  . rosuvastatin (CRESTOR) 20 MG tablet TAKE 1 TABLET(20 MG) BY MOUTH AT BEDTIME 90 tablet 0  . sertraline (ZOLOFT) 100 MG tablet Take 100 mg by mouth daily at 12 noon.      No current facility-administered medications for this visit.       Marland Kitchen  PHYSICAL EXAMINATION: ECOG PERFORMANCE STATUS: 1 - Symptomatic but completely ambulatory  Vitals:   07/28/17 0929  BP: 133/74  Pulse: 76  Resp: 16   Filed Weights   07/28/17 0929  Weight: 228 lb (103.4 kg)    GENERAL: Well-nourished well-developed; Alert, no distress and comfortable.   Obese. Accompanied by her husband. She is walking by herself. EYES: Positive for pallor. OROPHARYNX: no thrush or ulceration;  NECK: supple, no masses felt LYMPH:  no palpable lymphadenopathy in the cervical, axillary or inguinal regions LUNGS: clear to auscultation and  No wheeze or crackles HEART/CVS: regular rate & rhythm and no murmurs; No lower extremity edema ABDOMEN: abdomen soft, non-tender and normal bowel sounds; possible splenomegaly. Musculoskeletal:no cyanosis of digits and no clubbing  PSYCH: alert & oriented x 3 with fluent speech NEURO: no focal motor/sensory deficits SKIN:  No skin rash/nodules.   LABORATORY DATA:  I have reviewed the data as listed Lab Results  Component Value Date   WBC 6.0 07/28/2017   HGB 7.9 (L) 07/28/2017   HCT 23.4 (L) 07/28/2017   MCV 94.7 07/28/2017   PLT 31 (L) 07/28/2017   Recent Labs    06/02/17 0828 06/23/17 0955 07/17/17 1229 07/18/17 0415  NA 140 140 137 137  K 4.3 3.6 3.6 3.3*  CL 106 104 103 104  CO2 '26 25 24 25  '$ GLUCOSE 117* 145* 114* 107*  BUN 22* '17 16 11  '$ CREATININE 1.26* 1.13* 1.37* 1.40*  CALCIUM 9.5 9.1 9.2 8.5*  GFRNONAA 41* 46* 37* 36*  GFRAA 47* 54* 43* 41*  PROT 7.5  --  8.0 7.0  ALBUMIN 3.8  --  3.6 3.1*  AST 21  --  29 492*  ALT 20  --  19 269*  ALKPHOS 84  --  77 220*  BILITOT 0.5  --  1.2 1.8*  BILIDIR  --   --  0.1  --   IBILI  --   --  1.1*  --     RADIOGRAPHIC STUDIES: I have personally reviewed the radiological images as listed and agreed with the findings in the report. Dg Chest 2 View  Result Date: 07/17/2017 CLINICAL DATA:  Chest pain.  Ongoing chemotherapy for leukemia. EXAM: CHEST  2 VIEW COMPARISON:  03/27/2017 FINDINGS: Lungs are adequately inflated without focal airspace consolidation or effusion. Subtle stable prominence of the interstitial markings. Cardiomediastinal silhouette and remainder of the exam is unchanged. IMPRESSION: No acute cardiopulmonary disease. Subtle stable interstitial prominence. Electronically Signed   By: Marin Olp M.D.   On: 07/17/2017 13:05   Ct Abdomen Pelvis W Contrast  Result Date: 07/17/2017 CLINICAL DATA:  Left-sided chest pain beginning yesterday with dyspnea on exertion. Abdominal pain not specified. EXAM: CT ABDOMEN AND PELVIS WITH CONTRAST TECHNIQUE: Multidetector CT imaging of the abdomen and pelvis was performed using the standard protocol following bolus administration of intravenous contrast. CONTRAST:  34m ISOVUE-300 IOPAMIDOL (ISOVUE-300) INJECTION 61% COMPARISON:  CT from 05/07/2016 and 12/08/2005 FINDINGS: Lower chest: Stable mild cardiomegaly without  pericardial effusion. Scarring in the left lower lobe. Hepatobiliary: Punctate calcifications of the liver compatible with old  granulomatous disease. Prior cholecystectomy. No biliary dilatation. Pancreas: Unremarkable. No pancreatic ductal dilatation or surrounding inflammatory changes. Spleen: Splenomegaly with the spleen measuring 15.9 x 14.8 x 5.8 cm (volume = 710 cm^3). Splenic granulomas are noted. Adrenals/Urinary Tract: Normal bilateral adrenal glands. Water attenuating cysts are re- demonstrated within both kidneys, the largest on the left up to 13 mm and on the right 14 mm. Nonobstructing calcifications are seen within the interpolar and lower aspect of the right kidney measuring up to 4 mm. No hydroureteronephrosis. The urinary bladder is unremarkable. Stomach/Bowel: Physiologic distention of the stomach with enteric contrast. No bowel obstruction is noted. Status post appendectomy. Moderate fecal residue is noted from cecum to splenic flexure. No acute bowel inflammation. Vascular/Lymphatic: Aortoiliac atherosclerosis. There is a 16 mm short axis lymph node just below the gastroesophageal junction, increased in size from 10 mm previously. Small subcentimeter mesenteric lymph nodes are noted. Small retroperitoneal lymph nodes are also present without significant change. Small splenules are present in the left upper quadrant versus small lymph nodes. Reproductive: Status post hysterectomy. No adnexal masses. Other: Re- demonstration of nonspecific anterior left upper quadrant subcutaneous soft tissue nodule measuring approximately 2.5 x 2.1 x 1.7 cm, unchanged in appearance since recent comparison but dates back to 2007 likely representing a nonaggressive finding. Musculoskeletal: Small left iliac focus of sclerosis likely to represent a bone island. Mild disc space narrowing T12 through L4 with slight retrolisthesis grade 1 of L3 on L4. No aggressive appearing osseous abnormality. IMPRESSION: 1. There has  been an interval increase in size of the spleen consistent splenomegaly since prior with the spleen now measuring 15.9 x 14.8 x 5.8 cm, previously 12.9 x 4.4 x 11.6 cm. 2. Interval increase in size of a epigastric lymph node measuring 16 mm in short axis just below the GE junction versus 10 mm previously. 3. Stable cardiomegaly. 4. Hepatic granulomas.  Prior cholecystectomy. 5. Water attenuating cysts appear stable within both kidneys with nonobstructing right-sided renal calculi currently noted. 6. Stable left anterior upper abdominal wall subcutaneous nodule dating back to 2007, though increased in size from 2007. Stable since recent comparison however, currently 2.5 x 2.1 x 1.7 cm. 7. Lumbar spondylosis with facet arthropathy. Electronically Signed   By: Ashley Royalty M.D.   On: 07/17/2017 16:29    ASSESSMENT & PLAN:   Acute myeloid leukemia in relapse ([place]) Bone marrow biopsy September 2018 shows- ACUTE MYELOID LEUKEMIA [38% blasts]; and also the peripheral blood. IDH- positive; currently on Enasidenib '100mg'$ /day [started on 10/19; [facility]; Dr.Foster]. Repeat BMBx- 11/12 [[facility]]- increase in blasts [35% to >60%; per report]. ? Too early for response.    # Today hemoglobin is 7.9/symtomatic; proceed with blood transfusion. White count 6; platalets- 31. Monitor labs on Bi- weekly basis. She understands treatments are still palliative; not curative.  #Left upper quadrant abdominal pain/worsening splenomegaly on a CT scan-November 2018.  Currently resolved.  #CKD stage III-stable.  #Transaminitis-question etiology.  Reviewed the recent blood work from [facility]; significantly improved.  # Prophylactic antibiotics- acyclovir/ diflucan,  # weekly cbc- monday & thursdays/ hold tube; follow up with me in 4 weeks/labs; cbc/cmp.      Cammie Sickle, MD 07/29/2017 8:18 AM

## 2017-07-28 NOTE — Assessment & Plan Note (Addendum)
Bone marrow biopsy September 2018 shows- ACUTE MYELOID LEUKEMIA [38% blasts]; and also the peripheral blood. IDH- positive; currently on Enasidenib 141m/day [started on 10/19; [facility]; Dr.Foster]. Repeat BMBx- 11/12 [[facility]]- increase in blasts [35% to >60%; per report].    # Given the possibility of difficulty in assessing the response [this started approximately 1 month ago]; and also given the absence of any good treatment options-it is decided to continue current therapy with Enasidenib.    # Today hemoglobin is 7.9/symtomatic; proceed with blood transfusion. White count 6; platalets- 31. Monitor labs on Bi- weekly basis. She understands treatments are still palliative; not curative.  #Left upper quadrant abdominal pain/worsening splenomegaly on a CT scan-November 2018.  Currently resolved.  #CKD stage III-stable.  #Transaminitis-question etiology.  Reviewed the recent blood work from [facility] significantly improved.  # Prophylactic antibiotics- acyclovir/ diflucan,  # weekly cbc- monday & thursdays/ hold tube; follow up with me in 4 weeks/labs; cbc/cmp.

## 2017-07-29 ENCOUNTER — Ambulatory Visit
Admission: RE | Admit: 2017-07-29 | Discharge: 2017-07-29 | Disposition: A | Discharge status: Home / Self Care | Payer: MEDICARE

## 2017-07-29 ENCOUNTER — Ambulatory Visit
Admission: RE | Admit: 2017-07-29 | Discharge: 2017-07-29 | Disposition: A | Discharge status: Home / Self Care | Payer: MEDICARE | Attending: Nurse Practitioner | Admitting: Nurse Practitioner

## 2017-07-29 DIAGNOSIS — R17 Unspecified jaundice: Principal | ICD-10-CM

## 2017-07-29 DIAGNOSIS — C9200 Acute myeloblastic leukemia, not having achieved remission: Secondary | ICD-10-CM

## 2017-07-29 DIAGNOSIS — R05 Cough: Secondary | ICD-10-CM

## 2017-07-29 LAB — TYPE AND SCREEN
ABO/RH(D): A POS
ANTIBODY SCREEN: NEGATIVE
Unit division: 0

## 2017-07-29 LAB — BPAM RBC
BLOOD PRODUCT EXPIRATION DATE: 201812132359
ISSUE DATE / TIME: 201811191227
UNIT TYPE AND RH: 6200

## 2017-07-29 LAB — CBC W/ AUTO DIFF
HEMATOCRIT: 28.8 % — ABNORMAL LOW (ref 36.0–46.0)
HEMOGLOBIN: 9.8 g/dL — ABNORMAL LOW (ref 12.0–16.0)
MEAN CORPUSCULAR HEMOGLOBIN CONC: 33.9 g/dL (ref 31.0–37.0)
MEAN CORPUSCULAR HEMOGLOBIN: 32.1 pg (ref 26.0–34.0)
MEAN CORPUSCULAR VOLUME: 94.6 fL (ref 80.0–100.0)
MEAN PLATELET VOLUME: 11.3 fL — ABNORMAL HIGH (ref 7.0–10.0)
NUCLEATED RED BLOOD CELLS: 1 /100{WBCs} (ref <=4)
PLATELET COUNT: 28 10*9/L — ABNORMAL LOW (ref 150–440)
RED BLOOD CELL COUNT: 3.04 10*12/L — ABNORMAL LOW (ref 4.00–5.20)
RED CELL DISTRIBUTION WIDTH: 22.8 % — ABNORMAL HIGH (ref 12.0–15.0)
WBC ADJUSTED: 6.6 10*9/L (ref 4.5–11.0)

## 2017-07-29 LAB — COMPREHENSIVE METABOLIC PANEL
ALKALINE PHOSPHATASE: 95 U/L (ref 38–126)
ALT (SGPT): 38 U/L (ref 15–48)
ANION GAP: 12 mmol/L (ref 9–15)
AST (SGOT): 20 U/L (ref 14–38)
BILIRUBIN TOTAL: 1.4 mg/dL — ABNORMAL HIGH (ref 0.0–1.2)
BLOOD UREA NITROGEN: 14 mg/dL (ref 7–21)
BUN / CREAT RATIO: 13
CALCIUM: 9.3 mg/dL (ref 8.5–10.2)
CHLORIDE: 106 mmol/L (ref 98–107)
CO2: 26 mmol/L (ref 22.0–30.0)
CREATININE: 1.07 mg/dL — ABNORMAL HIGH (ref 0.60–1.00)
EGFR MDRD AF AMER: >=60 mL/min/{1.73_m2} (ref >=60)
EGFR MDRD NON AF AMER: 50 mL/min/{1.73_m2} — ABNORMAL LOW (ref >=60)
GLUCOSE RANDOM: 116 mg/dL (ref 65–179)
POTASSIUM: 5.1 mmol/L — ABNORMAL HIGH (ref 3.5–5.0)
PROTEIN TOTAL: 6.7 g/dL (ref 6.5–8.3)
SODIUM: 144 mmol/L (ref 135–145)

## 2017-07-29 LAB — MANUAL DIFFERENTIAL
BASOPHILS - ABS (DIFF): 0 10*9/L (ref 0.0–0.1)
BLASTS - REL (DIFF): 11 % (ref <=0)
LYMPHOCYTES - ABS (DIFF): 1.6 10*9/L (ref 1.5–5.0)
MONOCYTES - ABS (DIFF): 1.5 10*9/L — ABNORMAL HIGH (ref 0.2–0.8)
NEUTROPHILS - ABS (DIFF): 2.8 10*9/L (ref 2.0–7.5)

## 2017-07-29 LAB — RED BLOOD CELL COUNT: Lab: 3.04 — ABNORMAL LOW

## 2017-07-29 LAB — SMEAR REVIEW

## 2017-07-29 LAB — BUN / CREAT RATIO: Urea nitrogen/Creatinine:MRto:Pt:Ser/Plas:Qn:: 13

## 2017-07-29 LAB — BILIRUBIN DIRECT: Bilirubin.glucuronidated:MCnc:Pt:Ser/Plas:Qn:: 0.3

## 2017-07-29 MED ORDER — BENZONATATE 100 MG CAPSULE
ORAL_CAPSULE | Freq: Four times a day (QID) | ORAL | 0 refills | 0.00000 days | Status: CP | PRN
Start: 2017-07-29 — End: 2017-09-18

## 2017-07-29 NOTE — Unmapped (Addendum)
I'm glad to hear that you're feeling well.    For your cough,  - Take Robitussin DM. Try that first. If it's not helpful, you can try the tessalon pearls.  - Tea with honey  - Increase your fluids  - We'll check a viral swab today.    Glad to hear that you'll get platelets tomorrow.    Back to see Dr. Malen Gauze next week.    Lab on 07/29/2017   Component Date Value Ref Range Status   ??? Sodium 07/29/2017 144  135 - 145 mmol/L Final   ??? Potassium 07/29/2017 5.1* 3.5 - 5.0 mmol/L Final   ??? Chloride 07/29/2017 106  98 - 107 mmol/L Final   ??? CO2 07/29/2017 26.0  22.0 - 30.0 mmol/L Final   ??? BUN 07/29/2017 14  7 - 21 mg/dL Final   ??? Creatinine 07/29/2017 1.07* 0.60 - 1.00 mg/dL Final   ??? BUN/Creatinine Ratio 07/29/2017 13   Final   ??? EGFR MDRD Non Af Amer 07/29/2017 50* >=60 mL/min/1.30m2 Final   ??? EGFR MDRD Af Amer 07/29/2017 >=60  >=60 mL/min/1.80m2 Final   ??? Anion Gap 07/29/2017 12  9 - 15 mmol/L Final   ??? Glucose 07/29/2017 116  65 - 179 mg/dL Final   ??? Calcium 16/06/9603 9.3  8.5 - 10.2 mg/dL Final   ??? Albumin 54/05/8118 3.8  3.5 - 5.0 g/dL Final   ??? Total Protein 07/29/2017 6.7  6.5 - 8.3 g/dL Final   ??? Total Bilirubin 07/29/2017 1.4* 0.0 - 1.2 mg/dL Final   ??? AST 14/78/2956 20  14 - 38 U/L Final   ??? ALT 07/29/2017 38  15 - 48 U/L Final   ??? Alkaline Phosphatase 07/29/2017 95  38 - 126 U/L Final   ??? WBC 07/29/2017 6.6  4.5 - 11.0 10*9/L Final   ??? RBC 07/29/2017 3.04* 4.00 - 5.20 10*12/L Final   ??? HGB 07/29/2017 9.8* 12.0 - 16.0 g/dL Final   ??? HCT 21/30/8657 28.8* 36.0 - 46.0 % Final   ??? MCV 07/29/2017 94.6  80.0 - 100.0 fL Final   ??? MCH 07/29/2017 32.1  26.0 - 34.0 pg Final   ??? MCHC 07/29/2017 33.9  31.0 - 37.0 g/dL Final   ??? RDW 84/69/6295 22.8* 12.0 - 15.0 % Final   ??? MPV 07/29/2017 11.3* 7.0 - 10.0 fL Final   ??? Platelet 07/29/2017 28* 150 - 440 10*9/L Final   ??? nRBC 07/29/2017 1  <=4 /100 WBCs Final   ??? Variable HGB Concentration 07/29/2017 Moderate* Not Present Final   ??? Neutrophil Left Shift 07/29/2017 1+* Not Present Final   ??? Macrocytosis 07/29/2017 Marked* Not Present Final   ??? Anisocytosis 07/29/2017 Marked* Not Present Final   ??? Hypochromasia 07/29/2017 Moderate* Not Present Final   ??? Blasts % 07/29/2017 11* <=0 % Final   ??? Absolute Neutrophils 07/29/2017 2.8  2.0 - 7.5 10*9/L Final   ??? Absolute Lymphocytes 07/29/2017 1.6  1.5 - 5.0 10*9/L Final   ??? Absolute Monocytes 07/29/2017 1.5* 0.2 - 0.8 10*9/L Final   ??? Absolute Eosinophils 07/29/2017 0.0  0.0 - 0.4 10*9/L Final   ??? Absolute Basophils 07/29/2017 0.0  0.0 - 0.1 10*9/L Final   ??? Smear Review Comments 07/29/2017 See Comment* Undefined Final    Blasts Present. Myelocytes present. Promyelocytes present-rare.    ??? Polychromasia 07/29/2017 Slight* Not Present Final

## 2017-07-29 NOTE — Unmapped (Signed)
Lab drawn and sent for analysis.

## 2017-07-30 ENCOUNTER — Telehealth: Payer: Self-pay | Admitting: *Deleted

## 2017-07-30 ENCOUNTER — Inpatient Hospital Stay: Payer: Medicare Other

## 2017-07-30 DIAGNOSIS — K219 Gastro-esophageal reflux disease without esophagitis: Secondary | ICD-10-CM | POA: Diagnosis not present

## 2017-07-30 DIAGNOSIS — M1990 Unspecified osteoarthritis, unspecified site: Secondary | ICD-10-CM | POA: Diagnosis not present

## 2017-07-30 DIAGNOSIS — R740 Nonspecific elevation of levels of transaminase and lactic acid dehydrogenase [LDH]: Secondary | ICD-10-CM | POA: Diagnosis not present

## 2017-07-30 DIAGNOSIS — I129 Hypertensive chronic kidney disease with stage 1 through stage 4 chronic kidney disease, or unspecified chronic kidney disease: Secondary | ICD-10-CM | POA: Diagnosis not present

## 2017-07-30 DIAGNOSIS — N183 Chronic kidney disease, stage 3 (moderate): Secondary | ICD-10-CM | POA: Diagnosis not present

## 2017-07-30 DIAGNOSIS — D649 Anemia, unspecified: Secondary | ICD-10-CM

## 2017-07-30 DIAGNOSIS — Z9049 Acquired absence of other specified parts of digestive tract: Secondary | ICD-10-CM | POA: Diagnosis not present

## 2017-07-30 DIAGNOSIS — Z792 Long term (current) use of antibiotics: Secondary | ICD-10-CM | POA: Diagnosis not present

## 2017-07-30 DIAGNOSIS — Z881 Allergy status to other antibiotic agents status: Secondary | ICD-10-CM | POA: Diagnosis not present

## 2017-07-30 DIAGNOSIS — R231 Pallor: Secondary | ICD-10-CM | POA: Diagnosis not present

## 2017-07-30 DIAGNOSIS — Z794 Long term (current) use of insulin: Secondary | ICD-10-CM | POA: Diagnosis not present

## 2017-07-30 DIAGNOSIS — C9202 Acute myeloblastic leukemia, in relapse: Secondary | ICD-10-CM | POA: Diagnosis not present

## 2017-07-30 DIAGNOSIS — Z452 Encounter for adjustment and management of vascular access device: Secondary | ICD-10-CM

## 2017-07-30 DIAGNOSIS — R161 Splenomegaly, not elsewhere classified: Secondary | ICD-10-CM | POA: Diagnosis not present

## 2017-07-30 DIAGNOSIS — E1122 Type 2 diabetes mellitus with diabetic chronic kidney disease: Secondary | ICD-10-CM | POA: Diagnosis not present

## 2017-07-30 DIAGNOSIS — Z79899 Other long term (current) drug therapy: Secondary | ICD-10-CM | POA: Diagnosis not present

## 2017-07-30 DIAGNOSIS — Z87891 Personal history of nicotine dependence: Secondary | ICD-10-CM | POA: Diagnosis not present

## 2017-07-30 DIAGNOSIS — E785 Hyperlipidemia, unspecified: Secondary | ICD-10-CM | POA: Diagnosis not present

## 2017-07-30 MED ORDER — ACETAMINOPHEN 325 MG PO TABS
650.0000 mg | ORAL_TABLET | Freq: Once | ORAL | Status: AC
  Administered 2017-07-30: 650 mg via ORAL
  Filled 2017-07-30: qty 2

## 2017-07-30 MED ORDER — SODIUM CHLORIDE 0.9 % IV SOLN
250.0000 mL | Freq: Once | INTRAVENOUS | Status: AC
  Administered 2017-07-30: 250 mL via INTRAVENOUS
  Filled 2017-07-30: qty 250

## 2017-07-30 MED ORDER — DIPHENHYDRAMINE HCL 25 MG PO CAPS
25.0000 mg | ORAL_CAPSULE | Freq: Once | ORAL | Status: AC
  Administered 2017-07-30: 25 mg via ORAL
  Filled 2017-07-30: qty 1

## 2017-07-30 NOTE — Telephone Encounter (Signed)
Pt requesting port placement. Dr. Rogue Bussing how would like me to coordinate this with her low plts counts.

## 2017-07-30 NOTE — Telephone Encounter (Signed)
Patient will most likely need 1 unit of plts before and after port placement. Will need to coordinate this care with IR. Pt is aware that this process may take some time to set up.

## 2017-07-31 LAB — BPAM PLATELET PHERESIS
Blood Product Expiration Date: 201811220917
ISSUE DATE / TIME: 201811211007
Unit Type and Rh: 6200

## 2017-07-31 LAB — PREPARE PLATELET PHERESIS: Unit division: 0

## 2017-08-03 NOTE — Unmapped (Signed)
[facility] Leukemia Clinic Follow-up    Patient Name: Jessica Wilson  Patient Age: 76 y.o.  Encounter Date: 07/29/2017    Primary Care Provider:  Tyson Dense, MD    Referring Physician:  Nigel Bridgeman, MD  [address]  [address]  [place], [place] [address]    Reason for visit:  Follow up of MDS progressed to AML, on enasidenib    History of Present Illness:  We had the pleasure of seeing Jessica Wilson in the Leukemia Clinic at the [facility] on 07/29/2017.  She is a 76 y.o. 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      Current therapy is enasidenib.    Her oncologic history is as follows:    Oncology History    Referring/Local Oncologist: Dr. Louretta Shorten, [facility]    Diagnosis: MDS with Excess Blasts-2    Genetics:   Karyotype/FISH: 46XX     Molecular Genetics: not performed    Pertinent Phenotypic data: no aberrant immunophenotype by flow cytometry, however blasts stained by IHC for CD117, MPO.  Only 5% marrow cells stained for CD34.    Disease-specific prognostic estimation: IPSS-R high risk, median OS 1.6 years, with 25% AML risk at 1.4 years            MDS (myelodysplastic syndrome), high grade (CMS-HCC)    04/17/2016 Initial Diagnosis     MDS (myelodysplastic syndrome), high grade (RAF-HCC)       04/29/2016 -  Chemotherapy     Azacitidine cycle 1: 75 mg/m2 [place] days 1-7 of 28-day cycles         05/27/2016 -  Chemotherapy     Azacitidine cycle 2: 75 mg/m2 [place] days 1-7 of 28 day cycles         06/24/2016 -  Chemotherapy     Azacitidine cycle 3: 75 mg/m2 [place] days 1-7 of 28 day cycle         07/22/2016 -  Chemotherapy     Azacitidine cycle 4: 75 mg/m2 [place] days 1-7 of 28 day cycle         08/19/2016 -  Chemotherapy     Azacitidine cycle 5: 75 mg/m2 [place] x 7 days of 28 day cycle         09/16/2016 -  Chemotherapy     Azacitidine cycle 6: 75 mbg/m2 [place] x 7 days of 28 day cycle         10/14/2016 -  Chemotherapy     Azacitidine cycle 7: 75 mg/m2 [place] x 7 days of 28 day cycle 12/09/2016 -  Chemotherapy     Azacitidine cycle 8: 60 mg/m2 [place] x 7 days of 28 day (cycle reduced by 20% due to hematologic toxicity)         01/13/2017 -  Chemotherapy     Azacitidine cycle 9: 60 mg/m2 [place] x 5 days of 28 day cycle (reduced by 2 days and 20% per dose due to hematologic toxicity)         05/29/2017 Progression     Given increasing transfusion requirements, repeat bone marrow biopsy done, now with 35% blasts and meets criteria for progression to AML.           Acute myeloid leukemia not having achieved remission (CMS-HCC)    06/02/2017 Initial Diagnosis     Acute myeloid leukemia not having achieved remission         06/28/2017 -  Chemotherapy  Idfhifa 100 mg by mouth daily          Interim History:  Since last seen here, Jessica Wilson has continued on enasidenib. She was hospitalized (11/10-11/15/18) for progressive LUQ pain w/ splenomegaly on CT. A bone marrow biopsy showed progressive AML, but she has not been on Idifa long enough to determine response.    She has had no further abdominal pain. She has no further hemoptysis.    Her energy is the same - so so. Her appetite is decreased. I don't want to eat. She has lost some weight.    Has a dry cough that started in the hospital. She has been using cough drops but has not taken other medication. Denies fevers.    Denies headache. Denies CP/palpitations. Denies SOB. Denies abd pain/n/v/d/c. Denies LE edema. Denies rash.    She received pRBC in [place] yesterday and will get platelets there tomorrow.     Otherwise, she denies new constitutional symptoms such as anorexia, weight loss night sweats or unexplained fevers.  Furthermore, she denies symptoms of marrow failure: unexplained bleeding or bruising, recurrent or unexplained intercurrent infections, dyspnea on exertion, lightheadedness, palpitations or chest pain.  There have been no new or unexplained pains or self-identified masses, swelling or enlarged lymph nodes.    Past Medical, Surgical and Family History were reviewed and pertinent updates were made in the Electronic Medical Record    Review of Systems:  Other than as reported above in the interim history, all other systems were negative.    ECOG Performance Status: 2    Medications:    Current Outpatient Prescriptions   Medication Sig Dispense Refill   ??? acetaminophen (TYLENOL) 325 MG tablet Take 650 mg by mouth every six (6) hours as needed for pain.     ??? acyclovir (ZOVIRAX) 400 MG tablet Take 400 mg by mouth Two (2) times a day.      ??? allopurinol (ZYLOPRIM) 300 MG tablet TAKE 1 TABLET(300 MG) BY MOUTH DAILY 90 tablet 0   ??? amLODIPine (NORVASC) 5 MG tablet Take 1 tablet (5 mg total) by mouth daily. 30 tablet 11   ??? buPROPion (WELLBUTRIN) 100 MG tablet Take 300 mg by mouth daily at 0600.      ??? CARTIA XT 180 mg 24 hr capsule      ??? clonazePAM (KLONOPIN) 0.5 MG tablet Take 1 mg by mouth two (2) times a day as needed for anxiety.      ??? metFORMIN (GLUCOPHAGE) 500 MG tablet Take 500 mg by mouth nightly.      ??? rosuvastatin (CRESTOR) 20 MG tablet Take 20 mg by mouth daily.      ??? sertraline (ZOLOFT) 100 MG tablet Take 150 mg by mouth daily.      ??? traMADol (ULTRAM) 50 mg tablet Take 1/2 tablet (25 mg) one to two times daily as needed for pain. 5 tablet 0   ??? umeclidinium-vilanterol (ANORO ELLIPTA) 62.5-25 mcg/actuation inhaler Inhale 1 puff daily.     ??? benzonatate (TESSALON PERLES) 100 MG capsule Take 1 capsule (100 mg total) by mouth every six (6) hours as needed for cough. 30 capsule 0     No current facility-administered medications for this visit.      Vital Signs:  Vitals:    07/29/17 1246   BP: 133/63   Pulse: 78   Resp: 18   Temp: 36.9 ??C (98.4 ??F)   SpO2: 97%     Physical Exam:  General: Resting, in  no apparent distress  HEENT:  Right eye closely. Left eye w/ PERRL. No scleral icterus or conjunctival injection. Oral mucosa without ulceration, erythema or exudate.   Lymph node exam:  No lymphadenopathy in the anterior/posterior cervical, supraclavicular, axillary basins.  Heart:  RRR.  S1, S2.  No murmurs, gallops or rubs.  Lungs:  Breathing is unlabored, and patient is speaking full sentences with ease.  No stridor.  CTAB. No rales, ronchi or crackles.    GI:  No distention or pain on palpation.  Bowel sounds are present and normal in quality.  No palpable hepatomegaly or splenomegaly.  No palpable masses.  Skin:  No rashes, petechiae or purpura.  No areas of skin breakdown. Warm to touch, dry, smooth and even.  Musculoskeletal:  No grossly-evident joint effusions or deformities.  Range of motion about the shoulder, elbow, hips and knees is grossly normal.    Psychiatric:  Alert and oriented to person, place, time and situation.  Range of affect is appropriate.    Neurologic:  CN II-XII are normal and symmetric.  Strength and sensation to pinprick/fine touch/proprioception are normal in bilateral upper and lower extremities.  Gait is normal.  Cerebellar tasks are completed with ease and are symmetric.  Extremities:  Appear well-perfused.  No clubbing, edema or cyanosis.    Relevant Laboratory, radiology and pathology results:  I personally viewed the most recent internal records and labs and discussed the available results with the patient or family.  A summary of results follows:  Office Visit on 07/29/2017   Component Date Value Ref Range Status   ??? Adenovirus 07/29/2017 Negative  Negative Final   ??? Coronavirus 07/29/2017 Negative  Negative Final   ??? Influenza A 07/29/2017 Negative  Negative Final   ??? Influenza B 07/29/2017 Negative  Negative Final   ??? Metapneumovirus 07/29/2017 Negative  Negative Final   ??? Parainfluenza 1 07/29/2017 Negative  Negative Final   ??? Parainfluenza 2 07/29/2017 Negative  Negative Final   ??? Parainfluenza 3 07/29/2017 Negative  Negative Final   ??? Parainfluenza 4 07/29/2017 Negative  Negative Final   ??? Rhinovirus 07/29/2017 Negative  Negative Final   ??? RSV 07/29/2017 Negative  Negative Final   Lab on 07/29/2017 Component Date Value Ref Range Status   ??? Sodium 07/29/2017 144  135 - 145 mmol/L Final   ??? Potassium 07/29/2017 5.1* 3.5 - 5.0 mmol/L Final   ??? Chloride 07/29/2017 106  98 - 107 mmol/L Final   ??? CO2 07/29/2017 26.0  22.0 - 30.0 mmol/L Final   ??? BUN 07/29/2017 14  7 - 21 mg/dL Final   ??? Creatinine 07/29/2017 1.07* 0.60 - 1.00 mg/dL Final   ??? BUN/Creatinine Ratio 07/29/2017 13   Final   ??? EGFR MDRD Non Af Amer 07/29/2017 50* >=60 mL/min/1.34m2 Final   ??? EGFR MDRD Af Amer 07/29/2017 >=60  >=60 mL/min/1.74m2 Final   ??? Anion Gap 07/29/2017 12  9 - 15 mmol/L Final   ??? Glucose 07/29/2017 116  65 - 179 mg/dL Final   ??? Calcium 36/64/4034 9.3  8.5 - 10.2 mg/dL Final   ??? Albumin 74/25/9563 3.8  3.5 - 5.0 g/dL Final   ??? Total Protein 07/29/2017 6.7  6.5 - 8.3 g/dL Final   ??? Total Bilirubin 07/29/2017 1.4* 0.0 - 1.2 mg/dL Final   ??? AST 87/56/4332 20  14 - 38 U/L Final   ??? ALT 07/29/2017 38  15 - 48 U/L Final   ??? Alkaline Phosphatase 07/29/2017 95  38 - 126 U/L Final   ??? WBC 07/29/2017 6.6  4.5 - 11.0 10*9/L Final   ??? RBC 07/29/2017 3.04* 4.00 - 5.20 10*12/L Final   ??? HGB 07/29/2017 9.8* 12.0 - 16.0 g/dL Final   ??? HCT 16/06/9603 28.8* 36.0 - 46.0 % Final   ??? MCV 07/29/2017 94.6  80.0 - 100.0 fL Final   ??? MCH 07/29/2017 32.1  26.0 - 34.0 pg Final   ??? MCHC 07/29/2017 33.9  31.0 - 37.0 g/dL Final   ??? RDW 54/05/8118 22.8* 12.0 - 15.0 % Final   ??? MPV 07/29/2017 11.3* 7.0 - 10.0 fL Final   ??? Platelet 07/29/2017 28* 150 - 440 10*9/L Final   ??? nRBC 07/29/2017 1  <=4 /100 WBCs Final   ??? Variable HGB Concentration 07/29/2017 Moderate* Not Present Final   ??? Neutrophil Left Shift 07/29/2017 1+* Not Present Final   ??? Macrocytosis 07/29/2017 Marked* Not Present Final   ??? Anisocytosis 07/29/2017 Marked* Not Present Final   ??? Hypochromasia 07/29/2017 Moderate* Not Present Final   ??? Blasts % 07/29/2017 11* <=0 % Final   ??? Absolute Neutrophils 07/29/2017 2.8  2.0 - 7.5 10*9/L Final   ??? Absolute Lymphocytes 07/29/2017 1.6  1.5 - 5.0 10*9/L Final ??? Absolute Monocytes 07/29/2017 1.5* 0.2 - 0.8 10*9/L Final   ??? Absolute Eosinophils 07/29/2017 0.0  0.0 - 0.4 10*9/L Final   ??? Absolute Basophils 07/29/2017 0.0  0.0 - 0.1 10*9/L Final   ??? Smear Review Comments 07/29/2017 See Comment* Undefined Final    Blasts Present. Myelocytes present. Promyelocytes present-rare.    ??? Polychromasia 07/29/2017 Slight* Not Present Final   ??? Bilirubin, Direct 07/29/2017 0.30  0.00 - 0.40 mg/dL Final   Admission on 14/78/2956, Discharged on 07/24/2017   No results displayed because visit has over 200 results.        Assessment:  Jessica Wilson is a 76 y.o. year old female with AML progressed from previous Myelodysplastic Syndrome now s/p 12 cycles of azacitidine prior to progression to AML. She had hematologic improvement, most notably early in her course of azacitidine, but has progressed clearly over the last 3-4 months, requiring more transfusions and with 38% blasts in the bone marrow. On 06/28/17, she began treatment with enasidenib. She had an interim hospitalization for progressive LUQ pain (11/10-11/15/18) with CT scan showing splenomegaly, thought possibly to be d/t differentiation syndrome w/ start of steroids, without recurrence on stopping of steroids and restart enasidenib. She presents to clinic for a post hospital follow up visit.    Jessica Wilson has had no further abdominal pain or hemoptysis. She feels fairly well. Based on today's labs, history and physical exam, there is no over evidence of tumor lysis syndrome. She has had some reduction in her circulating blast count. Her bilirubin has increased, which can happen with this drug and bears watching, but no current dose modification. She has a previously appointment with Dr. Malen Gauze next week, which I would like her to keep, so that they can review her overall progress/tolerance of enasidenib.    Regarding her URI, I recommended supportive care measures. Her viral swab was negative.    Plan and Recommendations: **AML, secondary after MDS  - Continue enasidenib 100 mg daily  - To received platelets locally tomorrow.  - Monitor serum bilirubin  - RTC on 08/04/17 to see Dr. Malen Gauze    TLS prophylaxis:  - Cont allopurinol 300 mg by mouth daily  ??  Possible URI: afebrile, cough  nonproductive  - viral swab negative  - PRN Robitussin DM.   - PRN tessalon pearls.   - Increase fluid intake.    Infection prophylaxis: Has received 2018 influenza vaccination, PSV23 in 07/2016.  If ANC <0.5 between cycles, would add levofloxacin 500 mg per day and fluconazole 400 mg per day when neutropenic but does not need those currently  - Continue acyclovir.    Dr. Malen Gauze was available.    Mariana Kaufman, AGPCNP-BC  Nurse Practitioner  Hematology/Oncology  [facility]  07/29/2017

## 2017-08-04 ENCOUNTER — Other Ambulatory Visit: Payer: Self-pay | Admitting: *Deleted

## 2017-08-04 ENCOUNTER — Inpatient Hospital Stay: Payer: Medicare Other

## 2017-08-04 ENCOUNTER — Ambulatory Visit
Admission: RE | Admit: 2017-08-04 | Discharge: 2017-08-04 | Disposition: A | Discharge status: Home / Self Care | Payer: MEDICARE

## 2017-08-04 ENCOUNTER — Ambulatory Visit
Admission: RE | Admit: 2017-08-04 | Discharge: 2017-08-04 | Disposition: A | Discharge status: Home / Self Care | Payer: MEDICARE | Attending: Internal Medicine | Admitting: Internal Medicine

## 2017-08-04 DIAGNOSIS — C9200 Acute myeloblastic leukemia, not having achieved remission: Principal | ICD-10-CM

## 2017-08-04 DIAGNOSIS — D46Z Other myelodysplastic syndromes: Principal | ICD-10-CM

## 2017-08-04 DIAGNOSIS — C9202 Acute myeloblastic leukemia, in relapse: Secondary | ICD-10-CM

## 2017-08-04 DIAGNOSIS — D696 Thrombocytopenia, unspecified: Secondary | ICD-10-CM

## 2017-08-04 LAB — MANUAL DIFFERENTIAL
BASOPHILS - ABS (DIFF): 0 10*9/L (ref 0.0–0.1)
BLASTS - REL (DIFF): 4 % (ref <=0)
LYMPHOCYTES - ABS (DIFF): 1.4 10*9/L — ABNORMAL LOW (ref 1.5–5.0)
MONOCYTES - ABS (DIFF): 0.8 10*9/L (ref 0.2–0.8)
NEUTROPHILS - ABS (DIFF): 1.4 10*9/L — ABNORMAL LOW (ref 2.0–7.5)

## 2017-08-04 LAB — COMPREHENSIVE METABOLIC PANEL
ALT (SGPT): 30 U/L (ref 15–48)
ANION GAP: 12 mmol/L (ref 9–15)
AST (SGOT): 16 U/L (ref 14–38)
BILIRUBIN TOTAL: 1.2 mg/dL (ref 0.0–1.2)
BLOOD UREA NITROGEN: 18 mg/dL (ref 7–21)
BUN / CREAT RATIO: 15
CALCIUM: 9.2 mg/dL (ref 8.5–10.2)
CHLORIDE: 105 mmol/L (ref 98–107)
CO2: 26 mmol/L (ref 22.0–30.0)
CREATININE: 1.17 mg/dL — ABNORMAL HIGH (ref 0.60–1.00)
EGFR MDRD AF AMER: 55 mL/min/{1.73_m2} — ABNORMAL LOW (ref >=60)
EGFR MDRD NON AF AMER: 45 mL/min/{1.73_m2} — ABNORMAL LOW (ref >=60)
POTASSIUM: 4.8 mmol/L (ref 3.5–5.0)
PROTEIN TOTAL: 6.9 g/dL (ref 6.5–8.3)
SODIUM: 143 mmol/L (ref 135–145)

## 2017-08-04 LAB — MACROCYTES

## 2017-08-04 LAB — CBC W/ AUTO DIFF
HEMATOCRIT: 28 % — ABNORMAL LOW (ref 36.0–46.0)
HEMOGLOBIN: 9.2 g/dL — ABNORMAL LOW (ref 12.0–16.0)
MEAN CORPUSCULAR HEMOGLOBIN: 31.4 pg (ref 26.0–34.0)
MEAN CORPUSCULAR VOLUME: 95.2 fL (ref 80.0–100.0)
MEAN PLATELET VOLUME: 10.2 fL — ABNORMAL HIGH (ref 7.0–10.0)
NUCLEATED RED BLOOD CELLS: 1 /100{WBCs} (ref <=4)
PLATELET COUNT: 26 10*9/L — ABNORMAL LOW (ref 150–440)
RED CELL DISTRIBUTION WIDTH: 22 % — ABNORMAL HIGH (ref 12.0–15.0)
WBC ADJUSTED: 3.7 10*9/L — ABNORMAL LOW (ref 4.5–11.0)

## 2017-08-04 LAB — BLASTS - REL (DIFF): Lab: 4

## 2017-08-04 LAB — BILIRUBIN TOTAL: Bilirubin:MCnc:Pt:Ser/Plas:Qn:: 1.2

## 2017-08-04 MED ORDER — AMOXICILLIN 875 MG-POTASSIUM CLAVULANATE 125 MG TABLET
ORAL_TABLET | Freq: Two times a day (BID) | ORAL | 0 refills | 0.00000 days | Status: CP
Start: 2017-08-04 — End: 2017-12-15

## 2017-08-04 NOTE — Unmapped (Signed)
[facility] Leukemia Wilson Follow-up    Patient Name: Jessica Wilson  Patient Age: 76 y.o.  Encounter Date: 08/04/2017    Primary Care Provider:  Tyson Dense, MD    Referring Physician:  Nigel Bridgeman, MD  [address]  [address]  [place], [place] [address]    Reason for visit:  Follow up of MDS progressed to AML, on enasidenib    History of Present Illness:  We had the pleasure of seeing Jessica Wilson at the [facility] on 08/04/2017.  She is a 76 y.o. 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      Current therapy is enasidenib and reports good compliance.    Her oncologic history is as follows:    Oncology History    Referring/Local Oncologist: Dr. Louretta Shorten, [facility]    Diagnosis: MDS with Excess Blasts-2    Genetics:   Karyotype/FISH: 46XX     Molecular Genetics: not performed    Pertinent Phenotypic data: no aberrant immunophenotype by flow cytometry, however blasts stained by IHC for CD117, MPO.  Only 5% marrow cells stained for CD34.    Disease-specific prognostic estimation: IPSS-R high risk, median OS 1.6 years, with 25% AML risk at 1.4 years            MDS (myelodysplastic syndrome), high grade (CMS-HCC)    04/17/2016 Initial Diagnosis     MDS (myelodysplastic syndrome), high grade (RAF-HCC)       04/29/2016 -  Chemotherapy     Azacitidine cycle 1: 75 mg/m2 [place] days 1-7 of 28-day cycles         05/27/2016 -  Chemotherapy     Azacitidine cycle 2: 75 mg/m2 [place] days 1-7 of 28 day cycles         06/24/2016 -  Chemotherapy     Azacitidine cycle 3: 75 mg/m2 [place] days 1-7 of 28 day cycle         07/22/2016 -  Chemotherapy     Azacitidine cycle 4: 75 mg/m2 [place] days 1-7 of 28 day cycle         08/19/2016 -  Chemotherapy     Azacitidine cycle 5: 75 mg/m2 [place] x 7 days of 28 day cycle         09/16/2016 -  Chemotherapy     Azacitidine cycle 6: 75 mbg/m2 [place] x 7 days of 28 day cycle         10/14/2016 -  Chemotherapy     Azacitidine cycle 7: 75 mg/m2 [place] x 7 days of 28 day cycle         12/09/2016 -  Chemotherapy     Azacitidine cycle 8: 60 mg/m2  x 7 days of 28 day (cycle reduced by 20% due to hematologic toxicity)         01/13/2017 -  Chemotherapy     Azacitidine cycle 9: 60 mg/m2  x 5 days of 28 day cycle (reduced by 2 days and 20% per dose due to hematologic toxicity)         05/29/2017 Progression     Given increasing transfusion requirements, repeat bone marrow biopsy done, now with 35% blasts and meets criteria for progression to AML.           Acute myeloid leukemia not having achieved remission (CMS-HCC)    06/02/2017 Initial Diagnosis     Acute myeloid leukemia not having achieved remission  06/28/2017 - 07/17/2017 Chemotherapy     enasidenib 100 mg by mouth daily         07/17/2017 Adverse Reaction     Acute abdominal pain, splenomegaly, AKI , elevated transaminases.  Enasidenib held thru 11/13 and treated empirically for differentiation syndrome with dexamethasone. Resumed enasidenib 07/23/17         07/23/2017 -  Chemotherapy     enasidenib 100 mg PO daily          Interim History:  Since last seen here, Jessica Wilson has continued on enasidenib. She was hospitalized (11/10-11/15/18) for progressive LUQ pain w/ splenomegaly on CT. A bone marrow biopsy showed progressive AML, but at the time she was no on enasidenib long enough to show response. Her most recent labwork shows blast count of 4, significantly decreased from prior with improvement of her WBC. Additionally, her ANC is still 1.4 overall demonstrating a good response.    She continues to complain of the dry cough that started in the hospital. Cough improves with robitussin/ Cough started dry but became productive 1 week ago. Sputum is yellow/brown occasionally blood tinged. Cough is worse at night and laying flat on back. Has had nasal congestion, headaches, sinus pressure ear fullness for 1.5 weeks as well. Reporst dyspnea on exertion that has been ongoing since her diagnosis of AML without worsening. No LE edema.     She also notes a painful nodule on her the anterior of her right shin. Noticed ~1.5 weeks ago. Does not recall trauma. Feels that it is most painful in the center but radiation out circumferentially in the area of the bruising.    She denies fevers, nausea, vomiting, diarrhea, burning with urination. Her abdominal pain has subsided.     She denies unexplained bleeding or bruising, lightheadedness, palpitations or chest pain.    Past Medical, Surgical and Family History were reviewed and pertinent updates were made in the Electronic Medical Record    Review of Systems:  Other than as reported above in the interim history, all other systems were negative.    ECOG Performance Status: 2    Medications:    Current Outpatient Prescriptions   Medication Sig Dispense Refill   ??? acetaminophen (TYLENOL) 325 MG tablet Take 650 mg by mouth every six (6) hours as needed for pain.     ??? acyclovir (ZOVIRAX) 400 MG tablet Take 400 mg by mouth Two (2) times a day.      ??? allopurinol (ZYLOPRIM) 300 MG tablet TAKE 1 TABLET(300 MG) BY MOUTH DAILY 90 tablet 0   ??? amLODIPine (NORVASC) 5 MG tablet Take 1 tablet (5 mg total) by mouth daily. 30 tablet 11   ??? benzonatate (TESSALON PERLES) 100 MG capsule Take 1 capsule (100 mg total) by mouth every six (6) hours as needed for cough. 30 capsule 0   ??? buPROPion (WELLBUTRIN) 100 MG tablet Take 300 mg by mouth daily at 0600.      ??? CARTIA XT 180 mg 24 hr capsule      ??? clonazePAM (KLONOPIN) 0.5 MG tablet Take 1 mg by mouth two (2) times a day as needed for anxiety.      ??? metFORMIN (GLUCOPHAGE) 500 MG tablet Take 500 mg by mouth nightly.      ??? rosuvastatin (CRESTOR) 20 MG tablet Take 20 mg by mouth daily.      ??? sertraline (ZOLOFT) 100 MG tablet Take 150 mg by mouth daily.      ??? traMADol Janean Sark)  50 mg tablet Take 1/2 tablet (25 mg) one to two times daily as needed for pain. 5 tablet 0   ??? umeclidinium-vilanterol (ANORO ELLIPTA) 62.5-25 mcg/actuation inhaler Inhale 1 puff daily.       No current facility-administered medications for this visit.      Vital Signs:  Vitals:    08/04/17 0854   BP: 133/62   Pulse: 69   Resp: 18   Temp: 36.7 ??C   SpO2: 96%     Physical Exam:  General: Resting, in no apparent distress  HEENT:  Right eye intermittently closed, non reactive to light. Left eye w/ PERRL. No scleral icterus or conjunctival injection. Oral mucosa without ulceration, erythema or exudate. No sinus tenderness. Tympanic membranes were clear.   Lymph node exam:  No lymphadenopathy in the anterior/posterior cervical, supraclavicular, axillary basins.  Heart: RRR, normal S1, S2. No m/r/g.  Lungs:  CTAB. Good inspiratory effort. No rales, ronchi or crackles.    GI:  No distention or pain on palpation.  Bowel sounds are present and normal in quality.  No palpable hepatomegaly or splenomegaly.    Skin:  Small hard, indurated, non-fluctuant, and tender 1 cm nodule on R anterior shin without warmth. Surrounding ecchymosis expands  ~5cm in diameter circumferentially from pink nodule.   Musculoskeletal:  No grossly-evident joint effusions or deformities.    Psychiatric:  Alert and oriented to person, place, time and situation.  Range of affect is appropriate.    Neurologic:  CN II-XII are normal and symmetric.    Extremities:  Appear well-perfused. No clubbing, edema or cyanosis.    Relevant Laboratory, radiology and pathology results:  I personally viewed the most recent internal records and labs and discussed the available results with the patient or family.  A summary of results follows:  Appointment on 08/04/2017   Component Date Value Ref Range Status   ??? WBC 08/04/2017 3.7* 4.5 - 11.0 10*9/L Preliminary   ??? RBC 08/04/2017 2.94* 4.00 - 5.20 10*12/L Preliminary   ??? HGB 08/04/2017 9.2* 12.0 - 16.0 g/dL Preliminary   ??? HCT 08/04/2017 28.0* 36.0 - 46.0 % Preliminary   ??? MCV 08/04/2017 95.2  80.0 - 100.0 fL Preliminary   ??? MCH 08/04/2017 31.4  26.0 - 34.0 pg Preliminary   ??? MCHC 08/04/2017 33.0  31.0 - 37.0 g/dL Preliminary   ??? RDW 08/04/2017 22.0* 12.0 - 15.0 % Preliminary   ??? MPV 08/04/2017 10.2* 7.0 - 10.0 fL Preliminary   ??? Platelet 08/04/2017 26* 150 - 440 10*9/L Preliminary   ??? Variable HGB Concentration 08/04/2017 Slight* Not Present Preliminary   ??? Neutrophil Left Shift 08/04/2017 2+* Not Present Preliminary   ??? Macrocytosis 08/04/2017 Marked* Not Present Preliminary   ??? Anisocytosis 08/04/2017 Moderate* Not Present Preliminary   ??? Hypochromasia 08/04/2017 Moderate* Not Present Preliminary   Office Visit on 07/29/2017   Component Date Value Ref Range Status   ??? Adenovirus 07/29/2017 Negative  Negative Final   ??? Coronavirus 07/29/2017 Negative  Negative Final   ??? Influenza A 07/29/2017 Negative  Negative Final   ??? Influenza B 07/29/2017 Negative  Negative Final   ??? Metapneumovirus 07/29/2017 Negative  Negative Final   ??? Parainfluenza 1 07/29/2017 Negative  Negative Final   ??? Parainfluenza 2 07/29/2017 Negative  Negative Final   ??? Parainfluenza 3 07/29/2017 Negative  Negative Final   ??? Parainfluenza 4 07/29/2017 Negative  Negative Final   ??? Rhinovirus 07/29/2017 Negative  Negative Final   ??? RSV 07/29/2017 Negative  Negative Final   Lab on 07/29/2017   Component Date Value Ref Range Status   ??? Sodium 07/29/2017 144  135 - 145 mmol/L Final   ??? Potassium 07/29/2017 5.1* 3.5 - 5.0 mmol/L Final   ??? Chloride 07/29/2017 106  98 - 107 mmol/L Final   ??? CO2 07/29/2017 26.0  22.0 - 30.0 mmol/L Final   ??? BUN 07/29/2017 14  7 - 21 mg/dL Final   ??? Creatinine 07/29/2017 1.07* 0.60 - 1.00 mg/dL Final   ??? BUN/Creatinine Ratio 07/29/2017 13   Final   ??? EGFR MDRD Non Af Amer 07/29/2017 50* >=60 mL/min/1.63m2 Final   ??? EGFR MDRD Af Amer 07/29/2017 >=60  >=60 mL/min/1.90m2 Final   ??? Anion Gap 07/29/2017 12  9 - 15 mmol/L Final   ??? Glucose 07/29/2017 116  65 - 179 mg/dL Final   ??? Calcium 82/95/6213 9.3  8.5 - 10.2 mg/dL Final   ??? Albumin 08/65/7846 3.8  3.5 - 5.0 g/dL Final   ??? Total Protein 07/29/2017 6.7 6.5 - 8.3 g/dL Final   ??? Total Bilirubin 07/29/2017 1.4* 0.0 - 1.2 mg/dL Final   ??? AST 96/29/5284 20  14 - 38 U/L Final   ??? ALT 07/29/2017 38  15 - 48 U/L Final   ??? Alkaline Phosphatase 07/29/2017 95  38 - 126 U/L Final   ??? WBC 07/29/2017 6.6  4.5 - 11.0 10*9/L Final   ??? RBC 07/29/2017 3.04* 4.00 - 5.20 10*12/L Final   ??? HGB 07/29/2017 9.8* 12.0 - 16.0 g/dL Final   ??? HCT 13/24/4010 28.8* 36.0 - 46.0 % Final   ??? MCV 07/29/2017 94.6  80.0 - 100.0 fL Final   ??? MCH 07/29/2017 32.1  26.0 - 34.0 pg Final   ??? MCHC 07/29/2017 33.9  31.0 - 37.0 g/dL Final   ??? RDW 27/25/3664 22.8* 12.0 - 15.0 % Final   ??? MPV 07/29/2017 11.3* 7.0 - 10.0 fL Final   ??? Platelet 07/29/2017 28* 150 - 440 10*9/L Final   ??? nRBC 07/29/2017 1  <=4 /100 WBCs Final   ??? Variable HGB Concentration 07/29/2017 Moderate* Not Present Final   ??? Neutrophil Left Shift 07/29/2017 1+* Not Present Final   ??? Macrocytosis 07/29/2017 Marked* Not Present Final   ??? Anisocytosis 07/29/2017 Marked* Not Present Final   ??? Hypochromasia 07/29/2017 Moderate* Not Present Final   ??? Blasts % 07/29/2017 11* <=0 % Final   ??? Absolute Neutrophils 07/29/2017 2.8  2.0 - 7.5 10*9/L Final   ??? Absolute Lymphocytes 07/29/2017 1.6  1.5 - 5.0 10*9/L Final   ??? Absolute Monocytes 07/29/2017 1.5* 0.2 - 0.8 10*9/L Final   ??? Absolute Eosinophils 07/29/2017 0.0  0.0 - 0.4 10*9/L Final   ??? Absolute Basophils 07/29/2017 0.0  0.0 - 0.1 10*9/L Final   ??? Smear Review Comments 07/29/2017 See Comment* Undefined Final    Blasts Present. Myelocytes present. Promyelocytes present-rare.    ??? Polychromasia 07/29/2017 Slight* Not Present Final   ??? Bilirubin, Direct 07/29/2017 0.30  0.00 - 0.40 mg/dL Final     Assessment:  Jessica Wilson is a 76 y.o. year old female with AML progressed from previous Myelodysplastic Syndrome now s/p 12 cycles of azacitidine prior to progression to AML. She initially had hematologic improvement but had eventual progression requiring more transfusions and with 38% blasts in the bone marrow. She was started on enasidenib on 06/28/17. Her recent hospitalization for LUQ pain was likely due to be due to splenomegaly which has  now resolved. However, while the absence of fever is reassuring, in this elderly patient with leukemia her ongoing subacute productive cough and nasal congestion are concerning for a bacterial sinusitis. She may benefit from a short course of antibiotics along with continued supportive management.    Regarding the nodule on her RLE (see media tab), this has unclear etiology. Lack of warmth and notable erythema are not impressive for an infectious process. This may represent Sweet's Syndrome vs leukemia cutis. However, the lesion appears stable. We will continue to monitor this lesion closely has it currently does not warrant biopsy/drainage yet.     Overall, Jessica Wilson has responded well to enasidenib and demonstrating good tolerance. Her hgb and platelets appear stable from last week >9/25 respectively without requiring an additional transfusion. Her ANC remains stable at 1.4, and her blasts continue to fall (4). We will continue her current regimen and monitor for continued response.     Her elevated T. Bili likely represents a indirect hyperbilirubinemia associated with enasidenib  And requires no further intervention.     Plan and Recommendations:  **AML, secondary after MDS  - Continue enasidenib 100 mg daily  - Continue with transfusions PRN as directed by her primary oncologist  - Monitor serum bilirubin  - RTC in 3 months for f/u    TLS prophylaxis:  - Cont allopurinol 300 mg by mouth daily  ??  Possible bacterial sinusitis: afebrile, w/productive cough   - Augmentin for 5 days  - PRN Robitussin DM.   - PRN tessalon pearls.   - Increase fluid intake.    Infection prophylaxis: Has received 2018 influenza vaccination, PSV23 in 07/2016.  If ANC <0.5 between cycles, would add levofloxacin 500 mg per day and fluconazole 400 mg per day when neutropenic but does not need those currently  - Continue acyclovir.

## 2017-08-04 NOTE — Unmapped (Addendum)
We will treat a presumed sinus infection with augmentin twice a day for 5 days.    Your blood counts are starting to improve, with stabilization of your platelets and hemoglobin (no transfusion needed this week) and your blast cells (leukemia cells) are declining in the blood (4% today compared to 39% in October).    We will continue the enasidenib and plan to see you in 2 weeks.    Let us know if your leg spot is growing or becoming more painful.      Appointment on 08/04/2017   Component Date Value Ref Range Status   ??? WBC 08/04/2017 3.7* 4.5 - 11.0 10*9/L Final   ??? RBC 08/04/2017 2.94* 4.00 - 5.20 10*12/L Final   ??? HGB 08/04/2017 9.2* 12.0 - 16.0 g/dL Final   ??? HCT 29/56/2130 28.0* 36.0 - 46.0 % Final   ??? MCV 08/04/2017 95.2  80.0 - 100.0 fL Final   ??? MCH 08/04/2017 31.4  26.0 - 34.0 pg Final   ??? MCHC 08/04/2017 33.0  31.0 - 37.0 g/dL Final   ??? RDW 86/57/8469 22.0* 12.0 - 15.0 % Final   ??? MPV 08/04/2017 10.2* 7.0 - 10.0 fL Final   ??? Platelet 08/04/2017 26* 150 - 440 10*9/L Final   ??? nRBC 08/04/2017 1  <=4 /100 WBCs Final   ??? Variable HGB Concentration 08/04/2017 Slight* Not Present Final   ??? Neutrophil Left Shift 08/04/2017 2+* Not Present Final   ??? Macrocytosis 08/04/2017 Marked* Not Present Final   ??? Anisocytosis 08/04/2017 Moderate* Not Present Final   ??? Hypochromasia 08/04/2017 Moderate* Not Present Final   ??? Blasts % 08/04/2017 4* <=0 % Final   ??? Absolute Neutrophils 08/04/2017 1.4* 2.0 - 7.5 10*9/L Final   ??? Absolute Lymphocytes 08/04/2017 1.4* 1.5 - 5.0 10*9/L Final   ??? Absolute Monocytes 08/04/2017 0.8  0.2 - 0.8 10*9/L Final   ??? Absolute Eosinophils 08/04/2017 0.0  0.0 - 0.4 10*9/L Final   ??? Absolute Basophils 08/04/2017 0.0  0.0 - 0.1 10*9/L Final   ??? Smear Review Comments 08/04/2017 See Comment* Undefined Final    Blasts Present. Myelocytes present.

## 2017-08-04 NOTE — Telephone Encounter (Signed)
Per Dr. Rogue Bussing - pt will need 1 unit of plts on Monday 08/11/17 and 1 unit of plts on 08/13/17 (with stat plt count check prior to infusion).

## 2017-08-04 NOTE — Telephone Encounter (Signed)
Port placement to be scheduled in special procedures for 08/13/17. Pt will need to arrive in special procedures at 1230pm that day. She will be finished approx. 300/330pm.

## 2017-08-05 NOTE — Telephone Encounter (Signed)
Spoke with patient- patient provided with lab/plt infusion new apts on 12/5 and port a cath apts. Patient instructed to be NPO 6-8 hrs prior to the port placement. She gave verbal understanding.

## 2017-08-07 ENCOUNTER — Inpatient Hospital Stay: Payer: Medicare Other

## 2017-08-07 DIAGNOSIS — M1990 Unspecified osteoarthritis, unspecified site: Secondary | ICD-10-CM | POA: Diagnosis not present

## 2017-08-07 DIAGNOSIS — N183 Chronic kidney disease, stage 3 (moderate): Secondary | ICD-10-CM | POA: Diagnosis not present

## 2017-08-07 DIAGNOSIS — Z87891 Personal history of nicotine dependence: Secondary | ICD-10-CM | POA: Diagnosis not present

## 2017-08-07 DIAGNOSIS — E785 Hyperlipidemia, unspecified: Secondary | ICD-10-CM | POA: Diagnosis not present

## 2017-08-07 DIAGNOSIS — R740 Nonspecific elevation of levels of transaminase and lactic acid dehydrogenase [LDH]: Secondary | ICD-10-CM | POA: Diagnosis not present

## 2017-08-07 DIAGNOSIS — Z792 Long term (current) use of antibiotics: Secondary | ICD-10-CM | POA: Diagnosis not present

## 2017-08-07 DIAGNOSIS — C9202 Acute myeloblastic leukemia, in relapse: Secondary | ICD-10-CM

## 2017-08-07 DIAGNOSIS — K219 Gastro-esophageal reflux disease without esophagitis: Secondary | ICD-10-CM | POA: Diagnosis not present

## 2017-08-07 DIAGNOSIS — R231 Pallor: Secondary | ICD-10-CM | POA: Diagnosis not present

## 2017-08-07 DIAGNOSIS — Z794 Long term (current) use of insulin: Secondary | ICD-10-CM | POA: Diagnosis not present

## 2017-08-07 DIAGNOSIS — E1122 Type 2 diabetes mellitus with diabetic chronic kidney disease: Secondary | ICD-10-CM | POA: Diagnosis not present

## 2017-08-07 DIAGNOSIS — Z9049 Acquired absence of other specified parts of digestive tract: Secondary | ICD-10-CM | POA: Diagnosis not present

## 2017-08-07 DIAGNOSIS — Z79899 Other long term (current) drug therapy: Secondary | ICD-10-CM | POA: Diagnosis not present

## 2017-08-07 DIAGNOSIS — I129 Hypertensive chronic kidney disease with stage 1 through stage 4 chronic kidney disease, or unspecified chronic kidney disease: Secondary | ICD-10-CM | POA: Diagnosis not present

## 2017-08-07 DIAGNOSIS — Z881 Allergy status to other antibiotic agents status: Secondary | ICD-10-CM | POA: Diagnosis not present

## 2017-08-07 DIAGNOSIS — R161 Splenomegaly, not elsewhere classified: Secondary | ICD-10-CM | POA: Diagnosis not present

## 2017-08-07 LAB — CBC WITH DIFFERENTIAL/PLATELET
BASOS ABS: 0.1 10*3/uL (ref 0–0.1)
Basophils Relative: 2 %
Eosinophils Absolute: 0 10*3/uL (ref 0–0.7)
Eosinophils Relative: 1 %
HEMATOCRIT: 25.4 % — AB (ref 35.0–47.0)
HEMOGLOBIN: 8.6 g/dL — AB (ref 12.0–16.0)
LYMPHS PCT: 39 %
Lymphs Abs: 1.3 10*3/uL (ref 1.0–3.6)
MCH: 31.8 pg (ref 26.0–34.0)
MCHC: 34 g/dL (ref 32.0–36.0)
MCV: 93.6 fL (ref 80.0–100.0)
Monocytes Absolute: 0.5 10*3/uL (ref 0.2–0.9)
Monocytes Relative: 15 %
NEUTROS ABS: 1.4 10*3/uL (ref 1.4–6.5)
Neutrophils Relative %: 43 %
Platelets: 25 10*3/uL — CL (ref 150–440)
RBC: 2.71 MIL/uL — AB (ref 3.80–5.20)
RDW: 21.5 % — ABNORMAL HIGH (ref 11.5–14.5)
WBC: 3.2 10*3/uL — AB (ref 3.6–11.0)

## 2017-08-07 LAB — COMPREHENSIVE METABOLIC PANEL
ALBUMIN: 3.8 g/dL (ref 3.5–5.0)
ALK PHOS: 81 U/L (ref 38–126)
ALT: 16 U/L (ref 14–54)
AST: 19 U/L (ref 15–41)
Anion gap: 11 (ref 5–15)
BUN: 20 mg/dL (ref 6–20)
CALCIUM: 9.2 mg/dL (ref 8.9–10.3)
CO2: 24 mmol/L (ref 22–32)
CREATININE: 1.27 mg/dL — AB (ref 0.44–1.00)
Chloride: 105 mmol/L (ref 101–111)
GFR calc Af Amer: 46 mL/min — ABNORMAL LOW (ref >60)
GFR calc non Af Amer: 40 mL/min — ABNORMAL LOW (ref >60)
GLUCOSE: 131 mg/dL — AB (ref 65–99)
Potassium: 4.1 mmol/L (ref 3.5–5.1)
SODIUM: 140 mmol/L (ref 135–145)
Total Bilirubin: 1.2 mg/dL (ref 0.3–1.2)
Total Protein: 7.5 g/dL (ref 6.5–8.1)

## 2017-08-07 LAB — SAMPLE TO BLOOD BANK

## 2017-08-07 NOTE — Progress Notes (Signed)
Reviewed patient's lab results with Dr. Rogue Bussing.  Per Dr. Rogue Bussing patient will not receive platelets or blood today.  Patient will return on Monday for platelets.

## 2017-08-11 ENCOUNTER — Inpatient Hospital Stay: Payer: Medicare Other | Attending: Internal Medicine

## 2017-08-11 ENCOUNTER — Inpatient Hospital Stay: Payer: Medicare Other

## 2017-08-11 ENCOUNTER — Telehealth: Payer: Self-pay | Admitting: *Deleted

## 2017-08-11 ENCOUNTER — Other Ambulatory Visit: Payer: Self-pay | Admitting: *Deleted

## 2017-08-11 DIAGNOSIS — Z7952 Long term (current) use of systemic steroids: Secondary | ICD-10-CM | POA: Insufficient documentation

## 2017-08-11 DIAGNOSIS — R161 Splenomegaly, not elsewhere classified: Secondary | ICD-10-CM | POA: Insufficient documentation

## 2017-08-11 DIAGNOSIS — I700 Atherosclerosis of aorta: Secondary | ICD-10-CM | POA: Insufficient documentation

## 2017-08-11 DIAGNOSIS — Z881 Allergy status to other antibiotic agents status: Secondary | ICD-10-CM | POA: Insufficient documentation

## 2017-08-11 DIAGNOSIS — J189 Pneumonia, unspecified organism: Secondary | ICD-10-CM | POA: Insufficient documentation

## 2017-08-11 DIAGNOSIS — D696 Thrombocytopenia, unspecified: Secondary | ICD-10-CM | POA: Diagnosis not present

## 2017-08-11 DIAGNOSIS — Z79899 Other long term (current) drug therapy: Secondary | ICD-10-CM | POA: Insufficient documentation

## 2017-08-11 DIAGNOSIS — E1122 Type 2 diabetes mellitus with diabetic chronic kidney disease: Secondary | ICD-10-CM | POA: Diagnosis not present

## 2017-08-11 DIAGNOSIS — R0602 Shortness of breath: Secondary | ICD-10-CM | POA: Diagnosis not present

## 2017-08-11 DIAGNOSIS — R6883 Chills (without fever): Secondary | ICD-10-CM | POA: Insufficient documentation

## 2017-08-11 DIAGNOSIS — Z87891 Personal history of nicotine dependence: Secondary | ICD-10-CM | POA: Insufficient documentation

## 2017-08-11 DIAGNOSIS — N183 Chronic kidney disease, stage 3 (moderate): Secondary | ICD-10-CM | POA: Diagnosis not present

## 2017-08-11 DIAGNOSIS — C9202 Acute myeloblastic leukemia, in relapse: Secondary | ICD-10-CM

## 2017-08-11 DIAGNOSIS — R231 Pallor: Secondary | ICD-10-CM | POA: Diagnosis not present

## 2017-08-11 DIAGNOSIS — E785 Hyperlipidemia, unspecified: Secondary | ICD-10-CM | POA: Diagnosis not present

## 2017-08-11 DIAGNOSIS — R05 Cough: Secondary | ICD-10-CM | POA: Diagnosis not present

## 2017-08-11 DIAGNOSIS — R918 Other nonspecific abnormal finding of lung field: Secondary | ICD-10-CM | POA: Insufficient documentation

## 2017-08-11 DIAGNOSIS — R5383 Other fatigue: Secondary | ICD-10-CM | POA: Insufficient documentation

## 2017-08-11 DIAGNOSIS — I129 Hypertensive chronic kidney disease with stage 1 through stage 4 chronic kidney disease, or unspecified chronic kidney disease: Secondary | ICD-10-CM | POA: Diagnosis not present

## 2017-08-11 DIAGNOSIS — R062 Wheezing: Secondary | ICD-10-CM | POA: Insufficient documentation

## 2017-08-11 DIAGNOSIS — J209 Acute bronchitis, unspecified: Secondary | ICD-10-CM | POA: Insufficient documentation

## 2017-08-11 DIAGNOSIS — Z794 Long term (current) use of insulin: Secondary | ICD-10-CM | POA: Insufficient documentation

## 2017-08-11 DIAGNOSIS — J440 Chronic obstructive pulmonary disease with acute lower respiratory infection: Secondary | ICD-10-CM | POA: Diagnosis not present

## 2017-08-11 DIAGNOSIS — D4622 Refractory anemia with excess of blasts 2: Secondary | ICD-10-CM | POA: Insufficient documentation

## 2017-08-11 DIAGNOSIS — D649 Anemia, unspecified: Secondary | ICD-10-CM | POA: Diagnosis not present

## 2017-08-11 DIAGNOSIS — K219 Gastro-esophageal reflux disease without esophagitis: Secondary | ICD-10-CM | POA: Insufficient documentation

## 2017-08-11 DIAGNOSIS — J029 Acute pharyngitis, unspecified: Secondary | ICD-10-CM | POA: Insufficient documentation

## 2017-08-11 DIAGNOSIS — R5381 Other malaise: Secondary | ICD-10-CM | POA: Insufficient documentation

## 2017-08-11 DIAGNOSIS — Z792 Long term (current) use of antibiotics: Secondary | ICD-10-CM | POA: Insufficient documentation

## 2017-08-11 DIAGNOSIS — R110 Nausea: Secondary | ICD-10-CM | POA: Insufficient documentation

## 2017-08-11 DIAGNOSIS — E669 Obesity, unspecified: Secondary | ICD-10-CM | POA: Insufficient documentation

## 2017-08-11 LAB — CBC WITH DIFFERENTIAL/PLATELET
BASOS ABS: 0.2 10*3/uL — AB (ref 0–0.1)
BASOS PCT: 4 %
EOS PCT: 0 %
Eosinophils Absolute: 0 10*3/uL (ref 0–0.7)
HCT: 24.5 % — ABNORMAL LOW (ref 35.0–47.0)
Hemoglobin: 8.3 g/dL — ABNORMAL LOW (ref 12.0–16.0)
Lymphocytes Relative: 32 %
Lymphs Abs: 1.6 10*3/uL (ref 1.0–3.6)
MCH: 32 pg (ref 26.0–34.0)
MCHC: 33.9 g/dL (ref 32.0–36.0)
MCV: 94.2 fL (ref 80.0–100.0)
MONO ABS: 0.8 10*3/uL (ref 0.2–0.9)
Monocytes Relative: 15 %
Neutro Abs: 2.5 10*3/uL (ref 1.4–6.5)
Neutrophils Relative %: 49 %
PLATELETS: 12 10*3/uL — AB (ref 150–400)
RBC: 2.6 MIL/uL — ABNORMAL LOW (ref 3.80–5.20)
RDW: 22 % — AB (ref 11.5–14.5)
WBC: 5.1 10*3/uL (ref 3.6–11.0)

## 2017-08-11 LAB — COMPREHENSIVE METABOLIC PANEL
ALK PHOS: 68 U/L (ref 38–126)
ALT: 15 U/L (ref 14–54)
ANION GAP: 13 (ref 5–15)
AST: 20 U/L (ref 15–41)
Albumin: 3.8 g/dL (ref 3.5–5.0)
BUN: 16 mg/dL (ref 6–20)
CALCIUM: 9.1 mg/dL (ref 8.9–10.3)
CHLORIDE: 104 mmol/L (ref 101–111)
CO2: 24 mmol/L (ref 22–32)
CREATININE: 1.23 mg/dL — AB (ref 0.44–1.00)
GFR, EST AFRICAN AMERICAN: 48 mL/min — AB (ref >60)
GFR, EST NON AFRICAN AMERICAN: 42 mL/min — AB (ref >60)
Glucose, Bld: 132 mg/dL — ABNORMAL HIGH (ref 65–99)
Potassium: 4 mmol/L (ref 3.5–5.1)
Sodium: 141 mmol/L (ref 135–145)
Total Bilirubin: 1.1 mg/dL (ref 0.3–1.2)
Total Protein: 7.4 g/dL (ref 6.5–8.1)

## 2017-08-11 LAB — SAMPLE TO BLOOD BANK

## 2017-08-11 MED ORDER — SODIUM CHLORIDE 0.9 % IV SOLN
250.0000 mL | Freq: Once | INTRAVENOUS | Status: AC
  Administered 2017-08-11: 250 mL via INTRAVENOUS
  Filled 2017-08-11: qty 250

## 2017-08-11 MED ORDER — ACETAMINOPHEN 325 MG PO TABS
650.0000 mg | ORAL_TABLET | Freq: Once | ORAL | Status: AC
  Administered 2017-08-11: 650 mg via ORAL
  Filled 2017-08-11: qty 2

## 2017-08-11 MED ORDER — DIPHENHYDRAMINE HCL 25 MG PO CAPS
25.0000 mg | ORAL_CAPSULE | Freq: Once | ORAL | Status: AC
  Administered 2017-08-11: 25 mg via ORAL
  Filled 2017-08-11: qty 1

## 2017-08-11 NOTE — Telephone Encounter (Signed)
Received phone call from [place] in [place]. IR provider would like pt to have 2 units of plts prior to port placement on 08/13/17. Explained that patient was initially planned for 1 unit of plts 12/3 and 1 on Wednesday 12/5. Discussed situation with Dr. Rogue Bussing. MD would like pt to have 1 unit of plts today -  12/3 and 2 units of plts on Wednesday 12/5. plts ordered per md order. This was also communicated to blood bank and Maudie Mercury, RN in infusion.

## 2017-08-11 NOTE — Progress Notes (Signed)
10:09 AM - critical plt count called by [place] Lawman in cancer ctr lab to [facility] [place]- read back process performed-plt count estimate at 12.  Dr. Rogue Bussing informed 1015 am. Read back process performed with md. Proceed with 1 unit of plts as scheduled only. hgb 8.3-pt is asymptomatic today anemia. Per Dr. Jacinto Reap - will only infuse 1 unit of plts today. This was communicated to [facility], RN-infusion nurse.

## 2017-08-12 ENCOUNTER — Other Ambulatory Visit: Payer: Self-pay | Admitting: Radiology

## 2017-08-12 NOTE — Unmapped (Signed)
[facility] Refill Coordination Note  Specialty Medication(s): IDHIFA 100mg       Jessica Wilson, DOB: 1941-05-31  Phone: [phone] (home) [phone] (work), Alternate phone contact: N/A  Phone or address changes today?: No  All above HIPAA information was verified with patient.  Shipping Address: [address]  [place] [address]   Insurance changes? No    Completed refill call assessment today to schedule patient's medication shipment from the [facility] [phone]).      Confirmed the medication and dosage are correct and have not changed: Yes, regimen is correct and unchanged.    Confirmed patient started or stopped the following medications in the past month:  No, there are no changes reported at this time.    Are you tolerating your medication?:  [name] reports tolerating the medication.    ADHERENCE    Is this medicine transplant or covered by Medicare Part B? No.        Did you miss any doses in the past 4 weeks? No missed doses reported.    FINANCIAL/SHIPPING    Delivery Scheduled: Yes, Expected medication delivery date: 08/19/17     [name] did not have any additional questions at this time.    Delivery address validated in [facility] scheduling system: Yes, address listed in [facility] is correct.    We will follow up with patient monthly for standard refill processing and delivery.      Thank you,  [employer]   [facility] Specialty Pharmacist

## 2017-08-13 ENCOUNTER — Other Ambulatory Visit: Payer: Self-pay

## 2017-08-13 ENCOUNTER — Other Ambulatory Visit: Payer: Self-pay | Admitting: *Deleted

## 2017-08-13 ENCOUNTER — Ambulatory Visit
Admission: RE | Admit: 2017-08-13 | Discharge: 2017-08-13 | Disposition: A | Discharge status: Home / Self Care | Payer: Medicare Other | Source: Ambulatory Visit | Attending: Oncology | Admitting: Oncology

## 2017-08-13 ENCOUNTER — Ambulatory Visit
Admission: RE | Admit: 2017-08-13 | Discharge: 2017-08-13 | Disposition: A | Discharge status: Home / Self Care | Payer: Medicare Other | Source: Ambulatory Visit | Attending: Internal Medicine | Admitting: Internal Medicine

## 2017-08-13 ENCOUNTER — Inpatient Hospital Stay: Payer: Medicare Other

## 2017-08-13 ENCOUNTER — Inpatient Hospital Stay: Payer: Medicare Other | Admitting: *Deleted

## 2017-08-13 ENCOUNTER — Inpatient Hospital Stay (HOSPITAL_BASED_OUTPATIENT_CLINIC_OR_DEPARTMENT_OTHER): Payer: Medicare Other | Admitting: Oncology

## 2017-08-13 VITALS — BP 168/84 | HR 90 | Temp 98.1°F | Resp 24

## 2017-08-13 DIAGNOSIS — R062 Wheezing: Secondary | ICD-10-CM | POA: Diagnosis not present

## 2017-08-13 DIAGNOSIS — D649 Anemia, unspecified: Secondary | ICD-10-CM | POA: Diagnosis not present

## 2017-08-13 DIAGNOSIS — D4622 Refractory anemia with excess of blasts 2: Secondary | ICD-10-CM

## 2017-08-13 DIAGNOSIS — D696 Thrombocytopenia, unspecified: Secondary | ICD-10-CM

## 2017-08-13 DIAGNOSIS — I700 Atherosclerosis of aorta: Secondary | ICD-10-CM | POA: Diagnosis not present

## 2017-08-13 DIAGNOSIS — J209 Acute bronchitis, unspecified: Secondary | ICD-10-CM | POA: Diagnosis not present

## 2017-08-13 DIAGNOSIS — R0602 Shortness of breath: Secondary | ICD-10-CM

## 2017-08-13 DIAGNOSIS — R110 Nausea: Secondary | ICD-10-CM

## 2017-08-13 DIAGNOSIS — R161 Splenomegaly, not elsewhere classified: Secondary | ICD-10-CM | POA: Diagnosis not present

## 2017-08-13 DIAGNOSIS — R5383 Other fatigue: Secondary | ICD-10-CM

## 2017-08-13 DIAGNOSIS — Z79899 Other long term (current) drug therapy: Secondary | ICD-10-CM

## 2017-08-13 DIAGNOSIS — R05 Cough: Secondary | ICD-10-CM

## 2017-08-13 DIAGNOSIS — J189 Pneumonia, unspecified organism: Secondary | ICD-10-CM | POA: Diagnosis not present

## 2017-08-13 DIAGNOSIS — R5381 Other malaise: Secondary | ICD-10-CM | POA: Diagnosis not present

## 2017-08-13 DIAGNOSIS — C9202 Acute myeloblastic leukemia, in relapse: Secondary | ICD-10-CM

## 2017-08-13 DIAGNOSIS — E785 Hyperlipidemia, unspecified: Secondary | ICD-10-CM

## 2017-08-13 DIAGNOSIS — R231 Pallor: Secondary | ICD-10-CM | POA: Diagnosis not present

## 2017-08-13 DIAGNOSIS — K219 Gastro-esophageal reflux disease without esophagitis: Secondary | ICD-10-CM

## 2017-08-13 DIAGNOSIS — R059 Cough, unspecified: Secondary | ICD-10-CM

## 2017-08-13 DIAGNOSIS — R6883 Chills (without fever): Secondary | ICD-10-CM

## 2017-08-13 DIAGNOSIS — J029 Acute pharyngitis, unspecified: Secondary | ICD-10-CM | POA: Diagnosis not present

## 2017-08-13 DIAGNOSIS — I129 Hypertensive chronic kidney disease with stage 1 through stage 4 chronic kidney disease, or unspecified chronic kidney disease: Secondary | ICD-10-CM

## 2017-08-13 DIAGNOSIS — N183 Chronic kidney disease, stage 3 (moderate): Secondary | ICD-10-CM | POA: Diagnosis not present

## 2017-08-13 DIAGNOSIS — Z794 Long term (current) use of insulin: Secondary | ICD-10-CM | POA: Diagnosis not present

## 2017-08-13 DIAGNOSIS — E1122 Type 2 diabetes mellitus with diabetic chronic kidney disease: Secondary | ICD-10-CM

## 2017-08-13 DIAGNOSIS — R918 Other nonspecific abnormal finding of lung field: Secondary | ICD-10-CM | POA: Diagnosis not present

## 2017-08-13 DIAGNOSIS — J440 Chronic obstructive pulmonary disease with acute lower respiratory infection: Secondary | ICD-10-CM | POA: Diagnosis not present

## 2017-08-13 LAB — CBC WITH DIFFERENTIAL/PLATELET
BASOS PCT: 2 %
Band Neutrophils: 7 %
Basophils Absolute: 0.1 10*3/uL (ref 0–0.1)
Blasts: 4 %
EOS ABS: 0.1 10*3/uL (ref 0–0.7)
EOS PCT: 2 %
HCT: 25.9 % — ABNORMAL LOW (ref 35.0–47.0)
Hemoglobin: 8.5 g/dL — ABNORMAL LOW (ref 12.0–16.0)
LYMPHS ABS: 1.5 10*3/uL (ref 1.0–3.6)
Lymphocytes Relative: 25 %
MCH: 32.1 pg (ref 26.0–34.0)
MCHC: 33 g/dL (ref 32.0–36.0)
MCV: 97.2 fL (ref 80.0–100.0)
MONO ABS: 0.9 10*3/uL (ref 0.2–0.9)
MONOS PCT: 15 %
MYELOCYTES: 9 %
Metamyelocytes Relative: 3 %
NEUTROS ABS: 3 10*3/uL (ref 1.4–6.5)
NEUTROS PCT: 32 %
NRBC: 3 /100{WBCs} — AB
PLATELETS: 22 10*3/uL — AB (ref 150–400)
Promyelocytes Absolute: 1 %
RBC: 2.67 MIL/uL — AB (ref 3.80–5.20)
RDW: 23.8 % — AB (ref 11.5–14.5)
WBC: 5.8 10*3/uL (ref 3.6–11.0)

## 2017-08-13 LAB — BASIC METABOLIC PANEL
Anion gap: 9 (ref 5–15)
BUN: 13 mg/dL (ref 6–20)
CO2: 25 mmol/L (ref 22–32)
CREATININE: 1.03 mg/dL — AB (ref 0.44–1.00)
Calcium: 8.9 mg/dL (ref 8.9–10.3)
Chloride: 108 mmol/L (ref 101–111)
GFR calc Af Amer: 60 mL/min — ABNORMAL LOW (ref >60)
GFR, EST NON AFRICAN AMERICAN: 51 mL/min — AB (ref >60)
GLUCOSE: 142 mg/dL — AB (ref 65–99)
POTASSIUM: 4.3 mmol/L (ref 3.5–5.1)
SODIUM: 142 mmol/L (ref 135–145)

## 2017-08-13 LAB — SAMPLE TO BLOOD BANK

## 2017-08-13 IMAGING — CT CT BIOPSY
2 series · 10 of 14 positions shown, 11 images · non-contrast
Comparison: none

CLINICAL DATA: Mild dysplastic syndrome, high-grade. Low platelets.

EXAM:
CT GUIDED DEEP ILIAC BONE ASPIRATION AND CORE BIOPSY
TECHNIQUE: The procedure, risks (including but not limited to bleeding,
infection, organ damage ), benefits, and alternatives were explained
to the patient. Questions regarding the procedure were encouraged
and answered. The patient understands and consents to the procedure.
Patient was placed supine on the CT gantry and limited axial scans
through the pelvis were obtained. Appropriate skin entry site was
identified. Skin site was marked, prepped with chlorhexidine, draped
in usual sterile fashion, and infiltrated locally with 1% lidocaine.

[Series 2: i-spiral 5.0 b30f · axial · 0.96mm/px · z∈[-127,-57]mm · 3 of 41 slices shown, 4 images]
[im 11/41  soft-tissue]
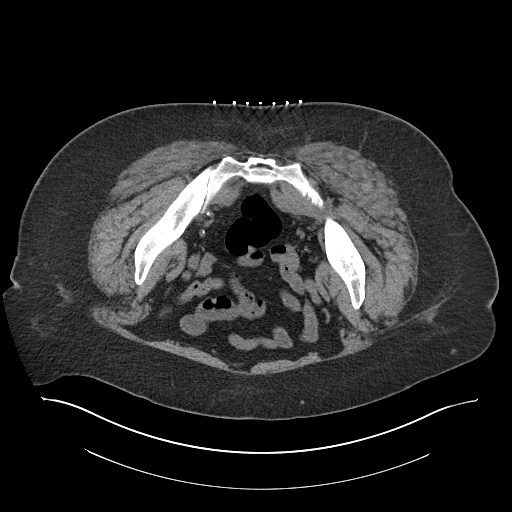
[im 11/41  bone]
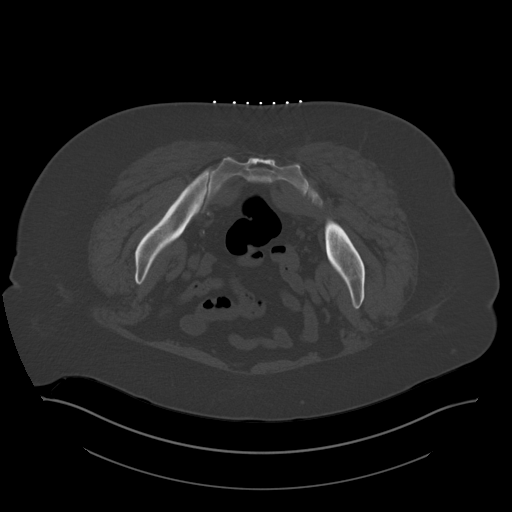
[im 21/41  bone]
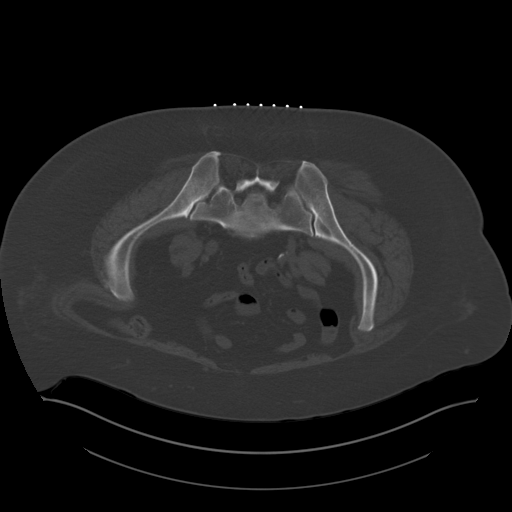
[im 31/41  bone]
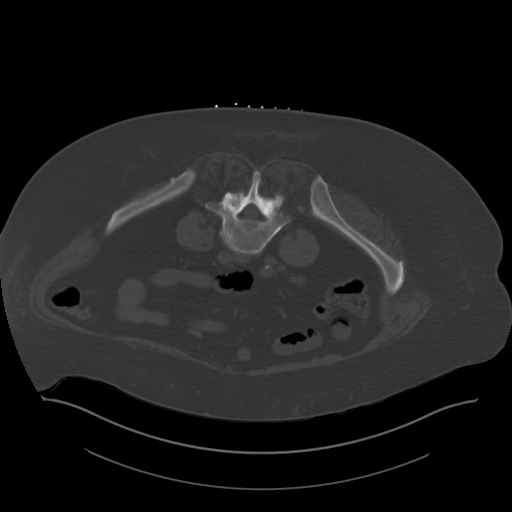

[Series 3: i-sequence 2.4 b30s · axial · 0.96mm/px · z∈[-98,-86]mm · 7 of 66 slices shown]
[im 9/66  bone]
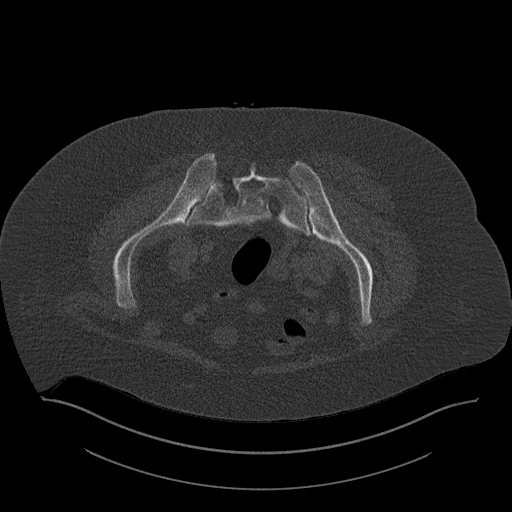
[im 17/66  bone]
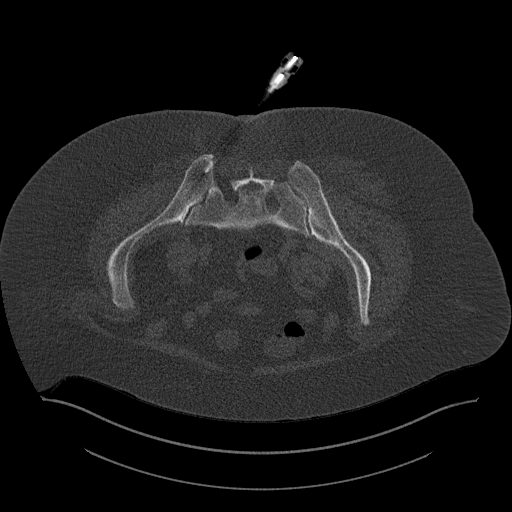
[im 25/66  bone]
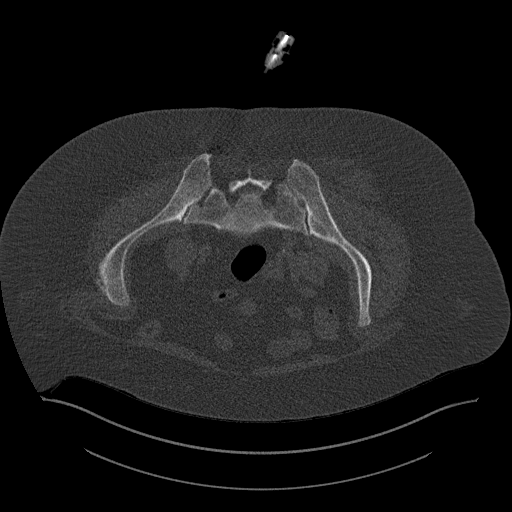
[im 33/66  bone]
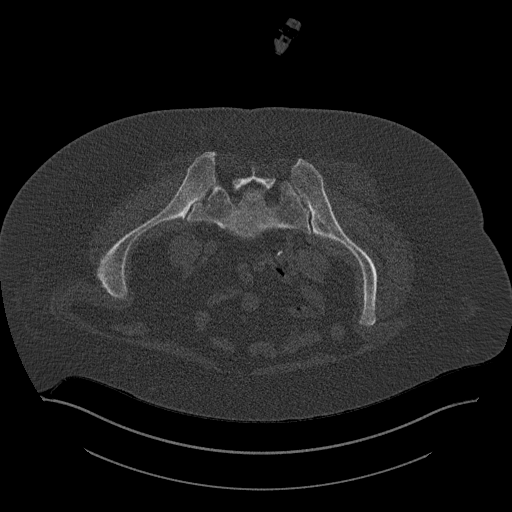
[im 41/66  bone]
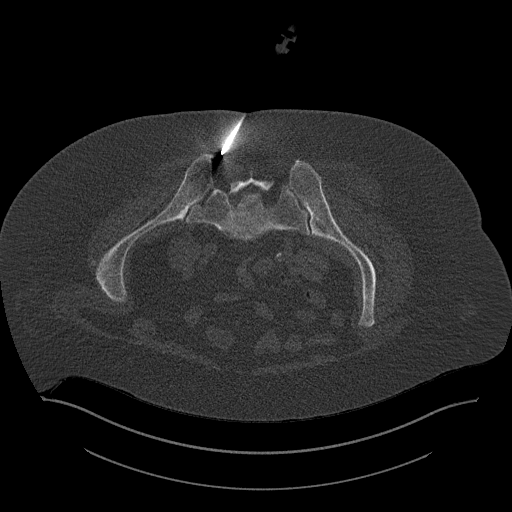
[im 49/66  bone]
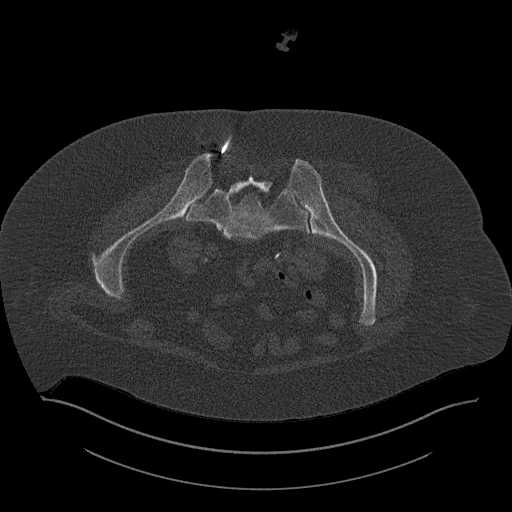
[im 57/66  bone]
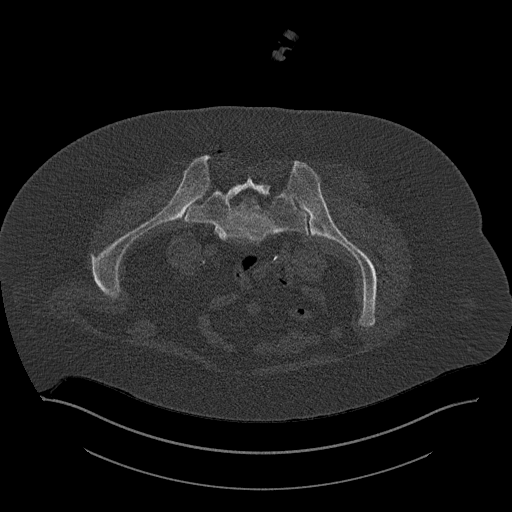

[10 of 14 positions shown; findings below may reference images not displayed]

Intravenous Fentanyl and Versed were administered as conscious
sedation during continuous monitoring of the patient's level of
consciousness and physiological / cardiorespiratory status by the
radiology RN, with a total moderate sedation time of 18 minutes.
Under CT fluoroscopic guidance an 11-gauge Cook trocar bone needle
was advanced into the left iliac bone just lateral to the sacroiliac
joint. Once needle tip position was confirmed, coaxial core and
aspiration samples were obtained. Post procedure scans show no
hematoma or fracture. Patient tolerated procedure well.

COMPLICATIONS:
COMPLICATIONS
none
IMPRESSION: 1. Technically successful CT guided left iliac bone core and
aspiration biopsy.

## 2017-08-13 MED ORDER — CEFAZOLIN SODIUM-DEXTROSE 2-4 GM/100ML-% IV SOLN: 2.0000 g | INTRAVENOUS | Status: DC

## 2017-08-13 MED ORDER — SODIUM CHLORIDE 0.9 % IV SOLN: INTRAVENOUS | Status: DC

## 2017-08-13 MED ORDER — ALBUTEROL SULFATE HFA 108 (90 BASE) MCG/ACT IN AERS: 2.0000 | INHALATION_SPRAY | Freq: Four times a day (QID) | RESPIRATORY_TRACT | 2 refills | Status: DC | PRN

## 2017-08-13 MED ORDER — BENZONATATE 100 MG PO CAPS: 100.0000 mg | ORAL_CAPSULE | Freq: Three times a day (TID) | ORAL | 0 refills | Status: DC | PRN

## 2017-08-13 MED ORDER — IPRATROPIUM-ALBUTEROL 0.5-2.5 (3) MG/3ML IN SOLN
3.0000 mL | Freq: Once | RESPIRATORY_TRACT | Status: AC
  Administered 2017-08-13: 3 mL via RESPIRATORY_TRACT

## 2017-08-13 MED ORDER — ALBUTEROL SULFATE (2.5 MG/3ML) 0.083% IN NEBU
2.5000 mg | INHALATION_SOLUTION | Freq: Once | RESPIRATORY_TRACT | Status: AC
  Administered 2017-08-13: 2.5 mg via RESPIRATORY_TRACT

## 2017-08-13 MED ORDER — IPRATROPIUM BROMIDE HFA 17 MCG/ACT IN AERS: 2.0000 | INHALATION_SPRAY | RESPIRATORY_TRACT | 12 refills | Status: DC | PRN

## 2017-08-13 MED ORDER — PREDNISONE 10 MG (21) PO TBPK: ORAL_TABLET | ORAL | 0 refills | Status: DC

## 2017-08-13 NOTE — Progress Notes (Signed)
Patient presents to clinic with c/o shortness of breath, sore throat and wheezing. She was treated with an amoxicillin on 11/26 by [facility] provider s/p bone marrow bx. Patient also given script for Tessalon Pearles for cough. She reports that she was feeling better until this morning. "I woke with with a very sore throat and I couldn't catch my breath." pt requesting port placement to be cnl today given that she would also have anesthesia. plt count is 22 today. This critical lab report was called to [place], [place] - 0904-read back process performed.  Read back process performed with Sonia Baller, [id number].

## 2017-08-13 NOTE — Progress Notes (Signed)
Symptom Management Consult note [facility]  [phone] [phone]  Patient Care Team: Roselee Nova, [name]   Name of the patient: Teresa Carrillo  [id number]  [birth year]   Date of visit: 08/13/17  Diagnosis- MDS (myelodysplastic syndrome), high grade [facility])  Chief complaint/ Reason for visit- Sore Throat/Cough/Shortness of breath  Heme/Onc history: #JUNE 2017- Severe neutropenia/ Anemia ~hb 9/platlets- 85-100 AUG 2017- REFRACTORY ANEMIA with EXCESS BLASTS [14% blasts- BMBx]; cytogenetics/FISH-N [SNP micorarray- not done]   # AUG 21st 2017-  START AZA 68m/m2 day- 1-7 q 28 days x4 cycles; DEC 6th- BMBx- <5% blasts; hypercellular with dysplasia.   # SEP 20th 2018- ACUTE MYELOID LEUKEMIA [38% blasts on BMBx]; IDH-MUTATION PRESENT; low FLT-3; OCT 18th 2018- ENASIDENIB [Dr.Foster; [facility]]   # CKD stage III  ------------------------------------------------------   MOLECULAR TESTING: NGS sent 06/2017 -FLT3-ITD <0.05 mutational burden -IDH2 -DNMT3A --treatment options include FLT3 inhibition and IDH2 inhibition; given low level FAOZ3-YQMallelic ratio [admittedly in peripheral blood, not bone marrow]- started on ENASIDENIB  Interval history- Patient presents today with cough, wheezing, sore throat and "I feel like I can't breath". She was scheduled to have her port placed today but due to current performance status she has canceled this appointment. She was seen by Dr. BRogue Bussingon 07/28/2017 for follow-up/evaluation and to re-start her Enasidenib. She had recently been discharged from the hospital for worsening left upper quadrant abdominal pain where her medication was stopped. CT scan showed splenomegaly without splenic infarct. [facility] increase in blasts and the patient was started on steroids with concerns for differentiation  syndrome. She was given a platelet transfusion before discharge. She continued to complain of moderate to severe fatigue but denied any fevers or chills, shortness of breath or cough and any gum bleeding. A chest x-ray showed hyperinflation of the lungs indicating possible COPD exacerbation. She was started on amoxicillin.  Today she presents with worsening cough, wheezing, sore throat and shortness of breath. She states she felt better after finishing her amoxicillin but approximately 2 days ago she started to feel worse. Her cough is worse and is keeping her up at night. She continues to take her [employer] prescribed. She admits to black and yellow sputum production. Her shortness of breath is worse all the time and she feels like she cannot breathe. She has been wheezing and has been using a rescue inhaler which she thinks is albuterol with some relief. Additionally she complains of a sore throat that started about 2 days ago. She has tried over-the-counter cough syrups and throat lozenges with minimal relief. She has no appetite but has been trying to drink water. She has had her flu shot. She denies fevers.  ECOG FS:0 - Asymptomatic  Review of systems- Review of Systems  Constitutional: Positive for chills and malaise/fatigue. Negative for fever.  HENT: Negative.   Eyes: Negative.   Respiratory: Positive for cough, sputum production, shortness of breath and wheezing.   Cardiovascular: Negative for chest pain and leg swelling.  Gastrointestinal: Positive for nausea. Negative for vomiting.  Genitourinary: Negative.   Musculoskeletal: Negative.   Skin: Negative.   Neurological: Positive for weakness.  Endo/Heme/Allergies: Negative.   Psychiatric/Behavioral: Negative.      Current treatment- Enasidenib.  Allergies  Allergen Reactions  . Macrobid [[employer]] Other (See Comments)    Reaction: unknown  Past Medical History:  Diagnosis Date  . Abdominal  wall mass   . Anginal pain ([place])   . Anxiety   . Arthritis   . Calculus of kidney   . Cystitis   . Depression   . Diabetes mellitus without complication (HCC)    elevated A1c  . Dyspnea on exertion   . Elevated serum creatinine   . Fibrocystic breast disease   . GERD (gastroesophageal reflux disease)   . Hearing loss   . Heart murmur   . HTN (hypertension)   . Hyperlipidemia   . MDS (myelodysplastic syndrome), high grade ([place]) 04/23/2016  . Microscopic hematuria   . Mouth sores   . Mucositis due to chemotherapy   . Obesity   . Risk for falls   . Sleep apnea   . Thrombocytopenia ([place])   . Urinary frequency   . Urinary urgency      Past Surgical History:  Procedure Laterality Date  . ABDOMINAL HYSTERECTOMY    . APPENDECTOMY    . CARDIAC CATHETERIZATION     x2  . CHOLECYSTECTOMY    . COLONOSCOPY N/A 02/24/2015   Procedure: COLONOSCOPY;  Surgeon: Manya Silvas, MD;  Location: [facility] ENDOSCOPY;  Service: Endoscopy;  Laterality: N/A;  . DIAGNOSTIC LAPAROSCOPY     Removal of benign abdominal tumor  . ESOPHAGOGASTRODUODENOSCOPY N/A 02/24/2015   Procedure: ESOPHAGOGASTRODUODENOSCOPY (EGD);  Surgeon: Manya Silvas, MD;  Location: [facility] ENDOSCOPY;  Service: Endoscopy;  Laterality: N/A;  . right eye surgery Right     Social History   Socioeconomic History  . Marital status: Married    Spouse name: Not on file  . Number of children: Not on file  . Years of education: Not on file  . Highest education level: Not on file  Social Needs  . Financial resource strain: Not on file  . Food insecurity - worry: Not on file  . Food insecurity - inability: Not on file  . Transportation needs - medical: Not on file  . Transportation needs - non-medical: Not on file  Occupational History  . Not on file  Tobacco Use  . Smoking status: Former Smoker    Types: Cigarettes    Last attempt to quit: 09/09/1988    Years since quitting: 28.9  . Smokeless tobacco: Never Used  . Tobacco  comment: quit 25 years ago  Substance and Sexual Activity  . Alcohol use: No    Alcohol/week: 0.0 oz  . Drug use: No  . Sexual activity: Not Currently    Birth control/protection: Post-menopausal  Other Topics Concern  . Not on file  Social History Narrative  . Not on file    Family History  Problem Relation Age of Onset  . Congestive Heart Failure Mother   . Diabetes Mother   . Coronary artery disease Mother   . Stroke Mother   . Cirrhosis Father      Current Outpatient Medications:  .  albuterol (PROVENTIL HFA) 108 (90 Base) MCG/ACT inhaler, Inhale 1 puff into the lungs every 6 (six) hours as needed for shortness of breath. , Disp: , Rfl:  .  benzonatate (TESSALON) 100 MG capsule, Take 1 capsule (100 mg total) by mouth 3 (three) times daily as needed for cough., Disp: 30 capsule, Rfl: 0 .  buPROPion (WELLBUTRIN XL) 150 MG 24 hr tablet, Take 300 mg by mouth daily at 12 noon. , Disp: , Rfl:  .  clonazePAM (KLONOPIN) 0.5 MG tablet, Take 0.5 mg by mouth 2 (  two) times daily. , Disp: , Rfl:  .  diltiazem (CARDIZEM CD) 180 MG 24 hr capsule, Take 1 capsule (180 mg total) by mouth daily., Disp: 30 capsule, Rfl: 0 .  insulin aspart (NOVOLOG) 100 UNIT/ML injection, Inject 0-9 Units 3 (three) times daily with meals into the skin., Disp: 10 mL, Rfl: 11 .  magic mouthwash w/lidocaine SOLN, Take 5 mLs by mouth 4 (four) times daily. 80 ml viscous lidocaine 2%, 80 ml Mylanta, 80 ml Diphenhydramine 12.5 mg/5 ml Elixir, 80 ml Nystatin 100,000 Unit suspension, 80 ml Prednisolone 15 mg/38m, 80 ml Distilled Water.  Sig: Swish/Swallow 5-10 ml four times a day as needed. Dispense 480 ml. 3RFs, Disp: 480 mL, Rfl: 3 .  midodrine (PROAMATINE) 10 MG tablet, Take 1 tablet (10 mg total) 3 (three) times daily with meals by mouth., Disp: , Rfl:  .  oxyCODONE (OXY IR/ROXICODONE) 5 MG immediate release tablet, Take 1 tablet (5 mg total) every 6 (six) hours as needed by mouth for moderate pain., Disp: 30 tablet, Rfl:  0 .  rosuvastatin (CRESTOR) 20 MG tablet, TAKE 1 TABLET(20 MG) BY MOUTH AT BEDTIME, Disp: 90 tablet, Rfl: 0 .  sertraline (ZOLOFT) 100 MG tablet, Take 100 mg by mouth daily at 12 noon. , Disp: , Rfl:  .  albuterol (PROVENTIL HFA;VENTOLIN HFA) 108 (90 Base) MCG/ACT inhaler, Inhale 2 puffs into the lungs every 6 (six) hours as needed for wheezing or shortness of breath., Disp: 1 Inhaler, Rfl: 2 .  hydrocortisone (ANUSOL-HC) 2.5 % rectal cream, Place 1 application rectally 2 (two) times daily as needed for hemorrhoids or itching. (Patient not taking: Reported on 08/13/2017), Disp: 30 g, Rfl: 0 .  ipratropium (ATROVENT HFA) 17 MCG/ACT inhaler, Inhale 2 puffs into the lungs every 4 (four) hours as needed for wheezing., Disp: 1 Inhaler, Rfl: 12 .  ondansetron (ZOFRAN-ODT) 4 MG disintegrating tablet, Take 4 mg by mouth every 8 (eight) hours as needed for nausea or vomiting., Disp: , Rfl:  .  polyethylene glycol (MIRALAX / GLYCOLAX) packet, Take 17 g by mouth daily as needed for mild constipation. (Patient not taking: Reported on 08/13/2017), Disp: 14 each, Rfl: 1 .  predniSONE (STERAPRED UNI-PAK 21 TAB) 10 MG (21) TBPK tablet, Take as directed., Disp: 21 tablet, Rfl: 0 .  prochlorperazine (COMPAZINE) 10 MG tablet, Take 1 tablet (10 mg total) by mouth every 6 (six) hours as needed (Nausea or vomiting). (Patient not taking: Reported on 08/13/2017), Disp: 30 tablet, Rfl: 1  Physical exam:  Vitals:   08/13/17 0900  BP: (!) 168/84  Pulse: 90  Resp: (!) 24  Temp: 98.1 F (36.7 C)  TempSrc: Tympanic  SpO2: 92%   Physical Exam  Constitutional: She is oriented to person, place, and time. She appears distressed.  HENT:  Head: Normocephalic and atraumatic.  Mouth/Throat: Posterior oropharyngeal erythema present.  Eyes: Pupils are equal, round, and reactive to light.  Neck: Normal range of motion. Neck supple.  Cardiovascular: Normal rate, regular rhythm and normal heart sounds.  Pulmonary/Chest: She has  wheezes in the right lower field and the left lower field.  Abdominal: Soft. Bowel sounds are normal.  Musculoskeletal: Normal range of motion. She exhibits no edema.  Neurological: She is alert and oriented to person, place, and time.  Skin: Skin is warm and dry.     CMP Latest Ref Rng & Units 08/13/2017  Glucose 65 - 99 mg/dL 142(H)  BUN 6 - 20 mg/dL 13  Creatinine 0.44 - 1.00 mg/dL  1.03(H)  Sodium 135 - 145 mmol/L 142  Potassium 3.5 - 5.1 mmol/L 4.3  Chloride 101 - 111 mmol/L 108  CO2 22 - 32 mmol/L 25  Calcium 8.9 - 10.3 mg/dL 8.9  Total Protein 6.5 - 8.1 g/dL -  Total Bilirubin 0.3 - 1.2 mg/dL -  Alkaline Phos 38 - 126 U/L -  AST 15 - 41 U/L -  ALT 14 - 54 U/L -   CBC Latest Ref Rng & Units 08/13/2017  WBC 3.6 - 11.0 K/uL 5.8  Hemoglobin 12.0 - 16.0 g/dL 8.5(L)  Hematocrit 35.0 - 47.0 % 25.9(L)  Platelets 150 - 400 K/uL 22(LL)    No images are attached to the encounter.  Dg Chest 2 View  Result Date: 08/13/2017 CLINICAL DATA:  Shortness of breath beginning this morning. EXAM: CHEST  2 VIEW COMPARISON:  PA and lateral chest 07/17/2017 and 07/29/2016. FINDINGS: Heart size is upper normal. No consolidative process, pneumothorax or effusion. Aortic atherosclerosis is noted. Ovoid calcifications in the left axilla may be calcified lymph nodes, unchanged. IMPRESSION: No acute disease. Atherosclerosis. Electronically Signed   By: Inge Rise M.D.   On: 08/13/2017 10:39   Dg Chest 2 View  Result Date: 07/17/2017 CLINICAL DATA:  Chest pain.  Ongoing chemotherapy for leukemia. EXAM: CHEST  2 VIEW COMPARISON:  03/27/2017 FINDINGS: Lungs are adequately inflated without focal airspace consolidation or effusion. Subtle stable prominence of the interstitial markings. Cardiomediastinal silhouette and remainder of the exam is unchanged. IMPRESSION: No acute cardiopulmonary disease. Subtle stable interstitial prominence. Electronically Signed   By: Marin Olp M.D.   On: 07/17/2017 13:05    Ct Abdomen Pelvis W Contrast  Result Date: 07/17/2017 CLINICAL DATA:  Left-sided chest pain beginning yesterday with dyspnea on exertion. Abdominal pain not specified. EXAM: CT ABDOMEN AND PELVIS WITH CONTRAST TECHNIQUE: Multidetector CT imaging of the abdomen and pelvis was performed using the standard protocol following bolus administration of intravenous contrast. CONTRAST:  37m ISOVUE-300 IOPAMIDOL (ISOVUE-300) INJECTION 61% COMPARISON:  CT from 05/07/2016 and 12/08/2005 FINDINGS: Lower chest: Stable mild cardiomegaly without pericardial effusion. Scarring in the left lower lobe. Hepatobiliary: Punctate calcifications of the liver compatible with old granulomatous disease. Prior cholecystectomy. No biliary dilatation. Pancreas: Unremarkable. No pancreatic ductal dilatation or surrounding inflammatory changes. Spleen: Splenomegaly with the spleen measuring 15.9 x 14.8 x 5.8 cm (volume = 710 cm^3). Splenic granulomas are noted. Adrenals/Urinary Tract: Normal bilateral adrenal glands. Water attenuating cysts are re- demonstrated within both kidneys, the largest on the left up to 13 mm and on the right 14 mm. Nonobstructing calcifications are seen within the interpolar and lower aspect of the right kidney measuring up to 4 mm. No hydroureteronephrosis. The urinary bladder is unremarkable. Stomach/Bowel: Physiologic distention of the stomach with enteric contrast. No bowel obstruction is noted. Status post appendectomy. Moderate fecal residue is noted from cecum to splenic flexure. No acute bowel inflammation. Vascular/Lymphatic: Aortoiliac atherosclerosis. There is a 16 mm short axis lymph node just below the gastroesophageal junction, increased in size from 10 mm previously. Small subcentimeter mesenteric lymph nodes are noted. Small retroperitoneal lymph nodes are also present without significant change. Small splenules are present in the left upper quadrant versus small lymph nodes. Reproductive: Status  post hysterectomy. No adnexal masses. Other: Re- demonstration of nonspecific anterior left upper quadrant subcutaneous soft tissue nodule measuring approximately 2.5 x 2.1 x 1.7 cm, unchanged in appearance since recent comparison but dates back to 2007 likely representing a nonaggressive finding. Musculoskeletal: Small left iliac  focus of sclerosis likely to represent a bone island. Mild disc space narrowing T12 through L4 with slight retrolisthesis grade 1 of L3 on L4. No aggressive appearing osseous abnormality. IMPRESSION: 1. There has been an interval increase in size of the spleen consistent splenomegaly since prior with the spleen now measuring 15.9 x 14.8 x 5.8 cm, previously 12.9 x 4.4 x 11.6 cm. 2. Interval increase in size of a epigastric lymph node measuring 16 mm in short axis just below the GE junction versus 10 mm previously. 3. Stable cardiomegaly. 4. Hepatic granulomas.  Prior cholecystectomy. 5. Water attenuating cysts appear stable within both kidneys with nonobstructing right-sided renal calculi currently noted. 6. Stable left anterior upper abdominal wall subcutaneous nodule dating back to 2007, though increased in size from 2007. Stable since recent comparison however, currently 2.5 x 2.1 x 1.7 cm. 7. Lumbar spondylosis with facet arthropathy. Electronically Signed   By: Ashley Royalty M.D.   On: 07/17/2017 16:29     Assessment and plan- Patient is a 76 y.o. female he presents with worsening cough, wheezing and shortness of breath. Oxygen saturations were 91% during initial assessment.She is hypertensive within normal heart rate. Bilateral wheezing auscultated. No crackles or rales. She is afebrile. Patient presented in wheel chair because she states she is to weak to stand. Patient appears pale. She is wearing a facemask. She is coughing during exam. Unfortunately no weight was obtained due to weakness. Labs reveal anemia (8.5) and thrombocytopenia (22). White count normal. Petechiae observed  on soft palate. No exudate present.  1. STAT chest x-ray. Chest x-ray identified no acute disease process. Rx steroid Dosepak for 6 days. Rx albuterol inhaler and ipratropium inhaler. 2. Shortness of breath: patient was given one albuterol nebulizer and duoneb in office. Patient prescribed inhalers and steroid dose pak. Possibility this could be COPD exacerbation.  3. Cough: Refilled her Tessalon Perles. Continue to take OTC cough medicine as needed. 4. Thrombocytopenia/anemia: Patient was scheduled to receive 2 units of platelets today. Platelet count 22. Unfortunately they were unable to find a vein that would tolerate fluids/platelets. Patient was instructed to go home and hydrate and return next Tuesday for labs with possible platelet transfusion. We will also reschedule port placement. 5. Sore Throat: Petechiae noted on soft palate. Unable to get a throat culture due to the inability to retrieve a strep swab. Patient instructed to let us know if throat worsened or if she develops a fever.  6. Differentiation syndrome: This was mentioned as a potential complication at [facility]. After reviewing differentiation syndrome, patient did not meet classic characterization such as fever, peripheral edema, pulmonary opacities, hypotension, renal and hepatic dysfunction and rash. She did have an increase in respirations and hypoxemia with oxygen saturation of 91%. This improved after 2 nebulizer treatments and in office saturations increased to 95%. Will continue to monitor this.  7. She will return to clinic on Tuesday with labs and 2 units of platelets. Will check back with her then.  Visit Diagnosis 1. Shortness of breath   2. Sore throat   3. Thrombocytopenia ([place])   4. Cough     Patient expressed understanding and was in agreement with this plan. She also understands that She can call clinic at any time with any questions, concerns, or complaints.   Greater than 50% was spent in counseling and  coordination of care with this patient including but not limited to discussion of the relevant topics above (See A&P) including, but not limited to diagnosis  and management of acute and chronic medical conditions.    Faythe Casa, AGNP-C [facility]- [phone] Pager- [phone] 08/14/2017 2:10 PM

## 2017-08-14 ENCOUNTER — Inpatient Hospital Stay: Payer: Medicare Other

## 2017-08-14 ENCOUNTER — Other Ambulatory Visit: Payer: Self-pay | Admitting: *Deleted

## 2017-08-15 ENCOUNTER — Telehealth: Payer: Self-pay | Admitting: Internal Medicine

## 2017-08-15 ENCOUNTER — Inpatient Hospital Stay: Payer: Medicare Other

## 2017-08-15 DIAGNOSIS — J029 Acute pharyngitis, unspecified: Secondary | ICD-10-CM | POA: Diagnosis not present

## 2017-08-15 DIAGNOSIS — E1122 Type 2 diabetes mellitus with diabetic chronic kidney disease: Secondary | ICD-10-CM | POA: Diagnosis not present

## 2017-08-15 DIAGNOSIS — R231 Pallor: Secondary | ICD-10-CM | POA: Diagnosis not present

## 2017-08-15 DIAGNOSIS — D649 Anemia, unspecified: Secondary | ICD-10-CM | POA: Diagnosis not present

## 2017-08-15 DIAGNOSIS — J189 Pneumonia, unspecified organism: Secondary | ICD-10-CM | POA: Diagnosis not present

## 2017-08-15 DIAGNOSIS — E785 Hyperlipidemia, unspecified: Secondary | ICD-10-CM | POA: Diagnosis not present

## 2017-08-15 DIAGNOSIS — R05 Cough: Secondary | ICD-10-CM | POA: Diagnosis not present

## 2017-08-15 DIAGNOSIS — R062 Wheezing: Secondary | ICD-10-CM | POA: Diagnosis not present

## 2017-08-15 DIAGNOSIS — D696 Thrombocytopenia, unspecified: Secondary | ICD-10-CM | POA: Diagnosis not present

## 2017-08-15 DIAGNOSIS — J440 Chronic obstructive pulmonary disease with acute lower respiratory infection: Secondary | ICD-10-CM | POA: Diagnosis not present

## 2017-08-15 DIAGNOSIS — I700 Atherosclerosis of aorta: Secondary | ICD-10-CM | POA: Diagnosis not present

## 2017-08-15 DIAGNOSIS — I129 Hypertensive chronic kidney disease with stage 1 through stage 4 chronic kidney disease, or unspecified chronic kidney disease: Secondary | ICD-10-CM | POA: Diagnosis not present

## 2017-08-15 DIAGNOSIS — C9202 Acute myeloblastic leukemia, in relapse: Secondary | ICD-10-CM

## 2017-08-15 DIAGNOSIS — Z794 Long term (current) use of insulin: Secondary | ICD-10-CM | POA: Diagnosis not present

## 2017-08-15 DIAGNOSIS — R0602 Shortness of breath: Secondary | ICD-10-CM | POA: Diagnosis not present

## 2017-08-15 DIAGNOSIS — R918 Other nonspecific abnormal finding of lung field: Secondary | ICD-10-CM | POA: Diagnosis not present

## 2017-08-15 DIAGNOSIS — J209 Acute bronchitis, unspecified: Secondary | ICD-10-CM | POA: Diagnosis not present

## 2017-08-15 DIAGNOSIS — N183 Chronic kidney disease, stage 3 (moderate): Secondary | ICD-10-CM | POA: Diagnosis not present

## 2017-08-15 DIAGNOSIS — R161 Splenomegaly, not elsewhere classified: Secondary | ICD-10-CM | POA: Diagnosis not present

## 2017-08-15 DIAGNOSIS — R110 Nausea: Secondary | ICD-10-CM | POA: Diagnosis not present

## 2017-08-15 DIAGNOSIS — D4622 Refractory anemia with excess of blasts 2: Secondary | ICD-10-CM | POA: Diagnosis not present

## 2017-08-15 DIAGNOSIS — R5381 Other malaise: Secondary | ICD-10-CM | POA: Diagnosis not present

## 2017-08-15 DIAGNOSIS — R5383 Other fatigue: Secondary | ICD-10-CM | POA: Diagnosis not present

## 2017-08-15 DIAGNOSIS — R6883 Chills (without fever): Secondary | ICD-10-CM | POA: Diagnosis not present

## 2017-08-15 LAB — PREPARE PLATELET PHERESIS
UNIT DIVISION: 0
UNIT DIVISION: 0
Unit division: 0
Unit division: 0
Unit division: 0

## 2017-08-15 LAB — BPAM PLATELET PHERESIS
BLOOD PRODUCT EXPIRATION DATE: 201812052359
BLOOD PRODUCT EXPIRATION DATE: 201812062359
Blood Product Expiration Date: 201812032359
Blood Product Expiration Date: 201812052359
Blood Product Expiration Date: 201812062359
ISSUE DATE / TIME: 201812031127
UNIT TYPE AND RH: 6200
UNIT TYPE AND RH: 6200
UNIT TYPE AND RH: 6200
Unit Type and Rh: 6200
Unit Type and Rh: 6200

## 2017-08-15 LAB — SAMPLE TO BLOOD BANK

## 2017-08-15 LAB — CBC WITH DIFFERENTIAL/PLATELET
BAND NEUTROPHILS: 17 %
BLASTS: 4 %
Basophils Absolute: 0 10*3/uL (ref 0–0.1)
Basophils Relative: 0 %
Eosinophils Absolute: 0 10*3/uL (ref 0–0.7)
Eosinophils Relative: 0 %
HEMATOCRIT: 28.3 % — AB (ref 35.0–47.0)
Hemoglobin: 9.4 g/dL — ABNORMAL LOW (ref 12.0–16.0)
LYMPHS ABS: 2.3 10*3/uL (ref 1.0–3.6)
LYMPHS PCT: 18 %
MCH: 32.4 pg (ref 26.0–34.0)
MCHC: 33 g/dL (ref 32.0–36.0)
MCV: 98 fL (ref 80.0–100.0)
MONO ABS: 2.1 10*3/uL — AB (ref 0.2–0.9)
MONOS PCT: 17 %
Metamyelocytes Relative: 6 %
Myelocytes: 3 %
NRBC: 4 /100{WBCs} — AB
Neutro Abs: 7.7 10*3/uL — ABNORMAL HIGH (ref 1.4–6.5)
Neutrophils Relative %: 35 %
Platelets: 21 10*3/uL — CL (ref 150–400)
RBC: 2.89 MIL/uL — AB (ref 3.80–5.20)
RDW: 24.8 % — ABNORMAL HIGH (ref 11.5–14.5)
WBC: 12.6 10*3/uL — AB (ref 3.6–11.0)

## 2017-08-17 MED FILL — IDHIFA/100 MG/TAB: IDHIFA/100 MG/TAB | 30 days supply | Qty: 30 | Fill #2

## 2017-08-18 ENCOUNTER — Other Ambulatory Visit: Payer: Medicare Other

## 2017-08-18 ENCOUNTER — Ambulatory Visit: Payer: Medicare Other

## 2017-08-18 NOTE — Unmapped (Signed)
Called pt to inform of clinic closure tomorrow. Patient has appt with local onc on Tuesday. Pt reports chest cold still ongoing, better than it was, howevr not completely resolved. Pr pt local chest xray negative. Denies fevers. Let pt know we will be in touch about rescheduling, pt appreciative.

## 2017-08-19 ENCOUNTER — Inpatient Hospital Stay: Payer: Medicare Other

## 2017-08-21 ENCOUNTER — Other Ambulatory Visit: Payer: Self-pay | Admitting: *Deleted

## 2017-08-21 ENCOUNTER — Inpatient Hospital Stay: Payer: Medicare Other | Admitting: *Deleted

## 2017-08-21 ENCOUNTER — Inpatient Hospital Stay: Payer: Medicare Other

## 2017-08-21 DIAGNOSIS — D464 Refractory anemia, unspecified: Secondary | ICD-10-CM

## 2017-08-21 DIAGNOSIS — R05 Cough: Secondary | ICD-10-CM | POA: Diagnosis not present

## 2017-08-21 DIAGNOSIS — D696 Thrombocytopenia, unspecified: Secondary | ICD-10-CM | POA: Diagnosis not present

## 2017-08-21 DIAGNOSIS — J440 Chronic obstructive pulmonary disease with acute lower respiratory infection: Secondary | ICD-10-CM | POA: Diagnosis not present

## 2017-08-21 DIAGNOSIS — C9202 Acute myeloblastic leukemia, in relapse: Secondary | ICD-10-CM | POA: Diagnosis not present

## 2017-08-21 DIAGNOSIS — R231 Pallor: Secondary | ICD-10-CM | POA: Diagnosis not present

## 2017-08-21 DIAGNOSIS — E785 Hyperlipidemia, unspecified: Secondary | ICD-10-CM | POA: Diagnosis not present

## 2017-08-21 DIAGNOSIS — E1122 Type 2 diabetes mellitus with diabetic chronic kidney disease: Secondary | ICD-10-CM | POA: Diagnosis not present

## 2017-08-21 DIAGNOSIS — R5383 Other fatigue: Secondary | ICD-10-CM | POA: Diagnosis not present

## 2017-08-21 DIAGNOSIS — R6883 Chills (without fever): Secondary | ICD-10-CM | POA: Diagnosis not present

## 2017-08-21 DIAGNOSIS — R918 Other nonspecific abnormal finding of lung field: Secondary | ICD-10-CM | POA: Diagnosis not present

## 2017-08-21 DIAGNOSIS — D4622 Refractory anemia with excess of blasts 2: Secondary | ICD-10-CM | POA: Diagnosis not present

## 2017-08-21 DIAGNOSIS — J209 Acute bronchitis, unspecified: Secondary | ICD-10-CM | POA: Diagnosis not present

## 2017-08-21 DIAGNOSIS — I700 Atherosclerosis of aorta: Secondary | ICD-10-CM | POA: Diagnosis not present

## 2017-08-21 DIAGNOSIS — R5381 Other malaise: Secondary | ICD-10-CM | POA: Diagnosis not present

## 2017-08-21 DIAGNOSIS — R0602 Shortness of breath: Secondary | ICD-10-CM | POA: Diagnosis not present

## 2017-08-21 DIAGNOSIS — D649 Anemia, unspecified: Secondary | ICD-10-CM

## 2017-08-21 DIAGNOSIS — I129 Hypertensive chronic kidney disease with stage 1 through stage 4 chronic kidney disease, or unspecified chronic kidney disease: Secondary | ICD-10-CM | POA: Diagnosis not present

## 2017-08-21 DIAGNOSIS — Z794 Long term (current) use of insulin: Secondary | ICD-10-CM | POA: Diagnosis not present

## 2017-08-21 DIAGNOSIS — N183 Chronic kidney disease, stage 3 (moderate): Secondary | ICD-10-CM | POA: Diagnosis not present

## 2017-08-21 DIAGNOSIS — R110 Nausea: Secondary | ICD-10-CM | POA: Diagnosis not present

## 2017-08-21 DIAGNOSIS — R161 Splenomegaly, not elsewhere classified: Secondary | ICD-10-CM | POA: Diagnosis not present

## 2017-08-21 DIAGNOSIS — J189 Pneumonia, unspecified organism: Secondary | ICD-10-CM | POA: Diagnosis not present

## 2017-08-21 DIAGNOSIS — R062 Wheezing: Secondary | ICD-10-CM | POA: Diagnosis not present

## 2017-08-21 DIAGNOSIS — J029 Acute pharyngitis, unspecified: Secondary | ICD-10-CM | POA: Diagnosis not present

## 2017-08-21 LAB — SAMPLE TO BLOOD BANK

## 2017-08-21 LAB — CBC WITH DIFFERENTIAL/PLATELET
BASOS ABS: 0.3 10*3/uL — AB (ref 0–0.1)
BLASTS: 1 %
Band Neutrophils: 9 %
Basophils Relative: 2 %
Eosinophils Absolute: 0.3 10*3/uL (ref 0–0.7)
Eosinophils Relative: 2 %
HEMATOCRIT: 27.2 % — AB (ref 35.0–47.0)
HEMOGLOBIN: 8.8 g/dL — AB (ref 12.0–16.0)
Lymphocytes Relative: 23 %
Lymphs Abs: 3.4 10*3/uL (ref 1.0–3.6)
MCH: 32.4 pg (ref 26.0–34.0)
MCHC: 32.2 g/dL (ref 32.0–36.0)
MCV: 100.7 fL — ABNORMAL HIGH (ref 80.0–100.0)
METAMYELOCYTES PCT: 4 %
MYELOCYTES: 20 %
Monocytes Absolute: 1.2 10*3/uL — ABNORMAL HIGH (ref 0.2–0.9)
Monocytes Relative: 8 %
NEUTROS PCT: 29 %
NRBC: 1 /100{WBCs} — AB
Neutro Abs: 9.4 10*3/uL — ABNORMAL HIGH (ref 1.4–6.5)
Other: 0 %
Platelets: 11 10*3/uL — CL (ref 150–400)
Promyelocytes Absolute: 2 %
RBC: 2.7 MIL/uL — AB (ref 3.80–5.20)
RDW: 25 % — ABNORMAL HIGH (ref 11.5–14.5)
SMEAR REVIEW: DECREASED
WBC: 14.7 10*3/uL — AB (ref 3.6–11.0)

## 2017-08-21 MED ORDER — DIPHENHYDRAMINE HCL 25 MG PO CAPS
25.0000 mg | ORAL_CAPSULE | Freq: Once | ORAL | Status: AC
  Administered 2017-08-21: 25 mg via ORAL
  Filled 2017-08-21: qty 1

## 2017-08-21 MED ORDER — HEPARIN SOD (PORK) LOCK FLUSH 100 UNIT/ML IV SOLN: 500.0000 [IU] | Freq: Every day | INTRAVENOUS | Status: DC | PRN

## 2017-08-21 MED ORDER — ACETAMINOPHEN 325 MG PO TABS
650.0000 mg | ORAL_TABLET | Freq: Once | ORAL | Status: AC
  Administered 2017-08-21: 650 mg via ORAL
  Filled 2017-08-21: qty 2

## 2017-08-21 MED ORDER — SODIUM CHLORIDE 0.9 % IV SOLN
250.0000 mL | Freq: Once | INTRAVENOUS | Status: AC
  Administered 2017-08-21: 250 mL via INTRAVENOUS
  Filled 2017-08-21: qty 250

## 2017-08-21 MED ORDER — SODIUM CHLORIDE 0.9% FLUSH
10.0000 mL | INTRAVENOUS | Status: DC | PRN
  Filled 2017-08-21: qty 10

## 2017-08-21 NOTE — Progress Notes (Signed)
penny-from [facility] called at 941 am to report critical lab on pt-Teresa Carrillo. plt estimated are 11, but will perform manual diff to confirm count. hgb 8.8. Read back process performed with lab tech. I personally spoke with Dr. Rogue Bussing at 0945-Read back process performed with md. v/o to infuse 1 unit of plts only today. No rbc units at this time.

## 2017-08-22 ENCOUNTER — Other Ambulatory Visit: Payer: Self-pay | Admitting: Internal Medicine

## 2017-08-22 DIAGNOSIS — D46Z Other myelodysplastic syndromes: Secondary | ICD-10-CM

## 2017-08-22 LAB — BPAM PLATELET PHERESIS
Blood Product Expiration Date: 201812162359
ISSUE DATE / TIME: 201812131120
Unit Type and Rh: 7300

## 2017-08-22 LAB — PREPARE PLATELET PHERESIS: Unit division: 0

## 2017-08-25 ENCOUNTER — Other Ambulatory Visit: Payer: Self-pay | Admitting: *Deleted

## 2017-08-25 ENCOUNTER — Inpatient Hospital Stay: Payer: Medicare Other

## 2017-08-25 ENCOUNTER — Ambulatory Visit
Admission: RE | Admit: 2017-08-25 | Discharge: 2017-08-25 | Disposition: A | Discharge status: Home / Self Care | Payer: Medicare Other | Source: Ambulatory Visit | Attending: Internal Medicine | Admitting: Internal Medicine

## 2017-08-25 ENCOUNTER — Telehealth: Payer: Self-pay | Admitting: Internal Medicine

## 2017-08-25 ENCOUNTER — Telehealth: Payer: Self-pay | Admitting: *Deleted

## 2017-08-25 ENCOUNTER — Inpatient Hospital Stay (HOSPITAL_BASED_OUTPATIENT_CLINIC_OR_DEPARTMENT_OTHER): Payer: Medicare Other | Admitting: Internal Medicine

## 2017-08-25 VITALS — BP 163/69 | HR 76 | Temp 98.9°F | Resp 16 | Wt 225.0 lb

## 2017-08-25 DIAGNOSIS — Z7952 Long term (current) use of systemic steroids: Secondary | ICD-10-CM

## 2017-08-25 DIAGNOSIS — R6883 Chills (without fever): Secondary | ICD-10-CM | POA: Diagnosis not present

## 2017-08-25 DIAGNOSIS — Z87891 Personal history of nicotine dependence: Secondary | ICD-10-CM

## 2017-08-25 DIAGNOSIS — D649 Anemia, unspecified: Secondary | ICD-10-CM | POA: Diagnosis not present

## 2017-08-25 DIAGNOSIS — C9202 Acute myeloblastic leukemia, in relapse: Secondary | ICD-10-CM | POA: Insufficient documentation

## 2017-08-25 DIAGNOSIS — R5381 Other malaise: Secondary | ICD-10-CM | POA: Diagnosis not present

## 2017-08-25 DIAGNOSIS — Z79899 Other long term (current) drug therapy: Secondary | ICD-10-CM

## 2017-08-25 DIAGNOSIS — J029 Acute pharyngitis, unspecified: Secondary | ICD-10-CM | POA: Diagnosis not present

## 2017-08-25 DIAGNOSIS — R231 Pallor: Secondary | ICD-10-CM | POA: Diagnosis not present

## 2017-08-25 DIAGNOSIS — E785 Hyperlipidemia, unspecified: Secondary | ICD-10-CM

## 2017-08-25 DIAGNOSIS — I129 Hypertensive chronic kidney disease with stage 1 through stage 4 chronic kidney disease, or unspecified chronic kidney disease: Secondary | ICD-10-CM

## 2017-08-25 DIAGNOSIS — R590 Localized enlarged lymph nodes: Secondary | ICD-10-CM | POA: Diagnosis not present

## 2017-08-25 DIAGNOSIS — D46Z Other myelodysplastic syndromes: Secondary | ICD-10-CM

## 2017-08-25 DIAGNOSIS — R161 Splenomegaly, not elsewhere classified: Secondary | ICD-10-CM

## 2017-08-25 DIAGNOSIS — R05 Cough: Secondary | ICD-10-CM | POA: Diagnosis not present

## 2017-08-25 DIAGNOSIS — R5383 Other fatigue: Secondary | ICD-10-CM | POA: Diagnosis not present

## 2017-08-25 DIAGNOSIS — R918 Other nonspecific abnormal finding of lung field: Secondary | ICD-10-CM

## 2017-08-25 DIAGNOSIS — D4622 Refractory anemia with excess of blasts 2: Secondary | ICD-10-CM | POA: Diagnosis not present

## 2017-08-25 DIAGNOSIS — Z794 Long term (current) use of insulin: Secondary | ICD-10-CM

## 2017-08-25 DIAGNOSIS — N183 Chronic kidney disease, stage 3 (moderate): Secondary | ICD-10-CM

## 2017-08-25 DIAGNOSIS — R0681 Apnea, not elsewhere classified: Secondary | ICD-10-CM | POA: Insufficient documentation

## 2017-08-25 DIAGNOSIS — R0602 Shortness of breath: Secondary | ICD-10-CM | POA: Diagnosis not present

## 2017-08-25 DIAGNOSIS — Z881 Allergy status to other antibiotic agents status: Secondary | ICD-10-CM

## 2017-08-25 DIAGNOSIS — D696 Thrombocytopenia, unspecified: Secondary | ICD-10-CM | POA: Diagnosis not present

## 2017-08-25 DIAGNOSIS — R062 Wheezing: Secondary | ICD-10-CM | POA: Diagnosis not present

## 2017-08-25 DIAGNOSIS — R110 Nausea: Secondary | ICD-10-CM | POA: Diagnosis not present

## 2017-08-25 DIAGNOSIS — E1122 Type 2 diabetes mellitus with diabetic chronic kidney disease: Secondary | ICD-10-CM

## 2017-08-25 DIAGNOSIS — J440 Chronic obstructive pulmonary disease with acute lower respiratory infection: Secondary | ICD-10-CM | POA: Diagnosis not present

## 2017-08-25 DIAGNOSIS — I2510 Atherosclerotic heart disease of native coronary artery without angina pectoris: Secondary | ICD-10-CM | POA: Diagnosis not present

## 2017-08-25 DIAGNOSIS — J209 Acute bronchitis, unspecified: Secondary | ICD-10-CM

## 2017-08-25 DIAGNOSIS — Z792 Long term (current) use of antibiotics: Secondary | ICD-10-CM

## 2017-08-25 DIAGNOSIS — I700 Atherosclerosis of aorta: Secondary | ICD-10-CM

## 2017-08-25 DIAGNOSIS — J189 Pneumonia, unspecified organism: Secondary | ICD-10-CM | POA: Diagnosis not present

## 2017-08-25 DIAGNOSIS — K219 Gastro-esophageal reflux disease without esophagitis: Secondary | ICD-10-CM

## 2017-08-25 LAB — COMPREHENSIVE METABOLIC PANEL
ALBUMIN: 3.8 g/dL (ref 3.5–5.0)
ALT: 27 U/L (ref 14–54)
AST: 24 U/L (ref 15–41)
Alkaline Phosphatase: 62 U/L (ref 38–126)
Anion gap: 9 (ref 5–15)
BILIRUBIN TOTAL: 1.1 mg/dL (ref 0.3–1.2)
BUN: 17 mg/dL (ref 6–20)
CHLORIDE: 105 mmol/L (ref 101–111)
CO2: 26 mmol/L (ref 22–32)
CREATININE: 1.28 mg/dL — AB (ref 0.44–1.00)
Calcium: 9 mg/dL (ref 8.9–10.3)
GFR calc Af Amer: 46 mL/min — ABNORMAL LOW (ref >60)
GFR, EST NON AFRICAN AMERICAN: 40 mL/min — AB (ref >60)
GLUCOSE: 136 mg/dL — AB (ref 65–99)
POTASSIUM: 4.3 mmol/L (ref 3.5–5.1)
Sodium: 140 mmol/L (ref 135–145)
TOTAL PROTEIN: 6.9 g/dL (ref 6.5–8.1)

## 2017-08-25 LAB — CBC WITH DIFFERENTIAL/PLATELET
BAND NEUTROPHILS: 24 %
BASOS ABS: 0.3 10*3/uL — AB (ref 0–0.1)
BASOS PCT: 2 %
Blasts: 1 %
EOS ABS: 0 10*3/uL (ref 0–0.7)
EOS PCT: 0 %
HCT: 26 % — ABNORMAL LOW (ref 35.0–47.0)
Hemoglobin: 8.4 g/dL — ABNORMAL LOW (ref 12.0–16.0)
LYMPHS ABS: 3.4 10*3/uL (ref 1.0–3.6)
LYMPHS PCT: 23 %
MCH: 33 pg (ref 26.0–34.0)
MCHC: 32.4 g/dL (ref 32.0–36.0)
MCV: 101.9 fL — ABNORMAL HIGH (ref 80.0–100.0)
Metamyelocytes Relative: 4 %
Monocytes Absolute: 0.7 10*3/uL (ref 0.2–0.9)
Monocytes Relative: 5 %
Myelocytes: 14 %
NEUTROS ABS: 10.3 10*3/uL — AB (ref 1.4–6.5)
NEUTROS PCT: 28 %
PLATELETS: 18 10*3/uL — AB (ref 150–400)
RBC: 2.55 MIL/uL — ABNORMAL LOW (ref 3.80–5.20)
RDW: 26.5 % — AB (ref 11.5–14.5)
WBC: 14.7 10*3/uL — ABNORMAL HIGH (ref 3.6–11.0)
nRBC: 2 /100 WBC — ABNORMAL HIGH

## 2017-08-25 LAB — SAMPLE TO BLOOD BANK

## 2017-08-25 MED ORDER — LEVOFLOXACIN 500 MG PO TABS: 500.0000 mg | ORAL_TABLET | Freq: Every day | ORAL | 0 refills | Status: DC

## 2017-08-25 NOTE — Assessment & Plan Note (Addendum)
Bone marrow biopsy September 2018 shows- ACUTE MYELOID LEUKEMIA [38% blasts]; and also the peripheral blood. IDH- positive; currently on Enasidenib 176m/day [started on 10/19; [facility]; Dr.Foster]. Repeat BMBx- 11/12 [[facility]]- increase in blasts [35% to >60%; per report].    #Severe anemia/thrombocytopenia-secondary to # AML; hemoglobin 8.3; platelets 18-hold transfusions today.  # ?  Response on Enadesenib with improvement of blasts on peripheral smear [facility]  For now continue Enasidenib.  However,-below  #Cough shortness of breath/wheezing -not improved on antibiotics prednisone; chest x-ray negative -recommend stat noncontrast CT chest.   #CKD stage III-stable.  # Prophylactic antibiotics- acyclovir/ diflucan,  # weekly cbc- monday & thursdays/ hold tube.   # CT scan stat; follow up with me dec 26th; labs- hold tube.

## 2017-08-25 NOTE — Progress Notes (Signed)
[place] NOTE  Patient Care Team: Teresa Nova, [name]  CHIEF COMPLAINTS/PURPOSE OF CONSULTATION:   Oncology History   #JUNE 2017- Severe neutropenia/ Anemia ~hb 9/platlets- 85-100 AUG 2017- REFRACTORY ANEMIA with EXCESS BLASTS [14% blasts- BMBx]; cytogenetics/FISH-N [SNP micorarray- not done]   # AUG 21st 2017-  START AZA 26m/m2 day- 1-7 q 28 days x4 cycles; DEC 6th- BMBx- <5% blasts; hypercellular with dysplasia.   # SEP 20th 2018- ACUTE MYELOID LEUKEMIA [38% blasts on BMBx]; IDH-MUTATION PRESENT; low FLT-3; OCT 18th 2018- ENASIDENIB [[facility]; [facility]]   # CKD stage III  ------------------------------------------------------   MOLECULAR TESTING: NGS sent 06/2017 -FLT3-ITD <0.05 mutational burden -IDH2 -DNMT3A --treatment options include FLT3 inhibition and IDH2 inhibition; given low level FGNF6-OZHallelic ratio [admittedly in peripheral blood, not bone marrow]- started on ENASIDENIB     MDS (myelodysplastic syndrome), high grade ([place]   04/23/2016 Initial Diagnosis    MDS (myelodysplastic syndrome), high grade (HCC)       Acute myeloid leukemia in relapse (HCC)  At    HISTORY OF PRESENTING ILLNESS:  FPEARLINA 89.[id number].  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  Patient was recently seen in the symptom management clinic-increasing cough shortness of breath; chest x-ray negative-diagnosed with bronchitis started on prednisone; Levaquin.  Patient's wheezing has improved on-inhalers.  However he has not noted resolution of her symptoms-she has been going on for the last 3 weeks as per patient.  Continues to have cough which is mildly productive.  Continues to have shortness of breath on exertion.  Patient continues to be on Enasidenib.No nausea no vomiting or diarrhea.  Chronic mild fatigue.  Denies any fevers or chills. Denies  any gum bleeding.   ROS: A complete 10 point review of system is done which is negative except mentioned above in history of present illness.  MEDICAL HISTORY:  Past Medical History:  Diagnosis Date  . Abdominal wall mass   . Anginal pain ([place]   . Anxiety   . Arthritis   . Calculus of kidney   . Cystitis   . Depression   . Diabetes mellitus without complication (HCC)    elevated A1c  . Dyspnea on exertion   . Elevated serum creatinine   . Fibrocystic breast disease   . GERD (gastroesophageal reflux disease)   . Hearing loss   . Heart murmur   . HTN (hypertension)   . Hyperlipidemia   . MDS (myelodysplastic syndrome), high grade ([place] 04/23/2016  . Microscopic hematuria   . Mouth sores   . Mucositis due to chemotherapy   . Obesity   . Risk for falls   . Sleep apnea   . Thrombocytopenia ([place]   . Urinary frequency   . Urinary urgency     SURGICAL HISTORY: Past Surgical History:  Procedure Laterality Date  . ABDOMINAL HYSTERECTOMY    . APPENDECTOMY    . CARDIAC CATHETERIZATION     x2  . CHOLECYSTECTOMY    . COLONOSCOPY N/A 02/24/2015   Procedure: COLONOSCOPY;  Surgeon: RManya Silvas MD;  [facility]  Service: Endoscopy;  Laterality: N/A;  . DIAGNOSTIC LAPAROSCOPY     Removal of benign abdominal tumor  . ESOPHAGOGASTRODUODENOSCOPY N/A 02/24/2015   Procedure: ESOPHAGOGASTRODUODENOSCOPY (EGD);  Surgeon: RManya Silvas MD;  [facility];  Service: Endoscopy;  Laterality: N/A;  . right eye  surgery Right     SOCIAL HISTORY: lives with family; [employer]; [employer] in [place] ret[place]. No smoking/ no alcohol.  Social History   Socioeconomic History  . Marital status: Married    Spouse name: Not on file  . Number of children: Not on file  . Years of education: Not on file  . Highest education level: Not on file  Social Needs  . Financial resource strain: Not on file  . Food insecurity - worry: Not on file  . Food insecurity - inability:  Not on file  . Transportation needs - medical: Not on file  . Transportation needs - non-medical: Not on file  Occupational History  . Not on file  Tobacco Use  . Smoking status: Former Smoker    Types: Cigarettes    Last attempt to quit: 09/09/1988    Years since quitting: 28.9  . Smokeless tobacco: Never Used  . Tobacco comment: quit 25 years ago  Substance and Sexual Activity  . Alcohol use: No    Alcohol/week: 0.0 oz  . Drug use: No  . Sexual activity: Not Currently    Birth control/protection: Post-menopausal  Other Topics Concern  . Not on file  Social History Narrative  . Not on file    FAMILY HISTORY: no cancers in family.  Family History  Problem Relation Age of Onset  . Congestive Heart Failure Mother   . Diabetes Mother   . Coronary artery disease Mother   . Stroke Mother   . Cirrhosis Father     ALLERGIES:  is allergic to macrobid [nitrofurantoin monohyd macro].  MEDICATIONS:  Current Outpatient Medications  Medication Sig Dispense Refill  . albuterol (PROVENTIL HFA) 108 (90 Base) MCG/ACT inhaler Inhale 1 puff into the lungs every 6 (six) hours as needed for shortness of breath.     Marland Kitchen albuterol (PROVENTIL HFA;VENTOLIN HFA) 108 (90 Base) MCG/ACT inhaler Inhale 2 puffs into the lungs every 6 (six) hours as needed for wheezing or shortness of breath. 1 Inhaler 2  . benzonatate (TESSALON) 100 MG capsule Take 1 capsule (100 mg total) by mouth 3 (three) times daily as needed for cough. 30 capsule 0  . buPROPion (WELLBUTRIN XL) 150 MG 24 hr tablet Take 300 mg by mouth daily at 12 noon.     . clonazePAM (KLONOPIN) 0.5 MG tablet Take 0.5 mg by mouth 2 (two) times daily.     Marland Kitchen diltiazem (CARDIZEM CD) 180 MG 24 hr capsule Take 1 capsule (180 mg total) by mouth daily. 30 capsule 0  . hydrocortisone (ANUSOL-HC) 2.5 % rectal cream Place 1 application rectally 2 (two) times daily as needed for hemorrhoids or itching. 30 g 0  . insulin aspart (NOVOLOG) 100 UNIT/ML injection  Inject 0-9 Units 3 (three) times daily with meals into the skin. 10 mL 11  . ipratropium (ATROVENT HFA) 17 MCG/ACT inhaler Inhale 2 puffs into the lungs every 4 (four) hours as needed for wheezing. 1 Inhaler 12  . magic mouthwash w/lidocaine SOLN Take 5 mLs by mouth 4 (four) times daily. 80 ml viscous lidocaine 2%, 80 ml Mylanta, 80 ml Diphenhydramine 12.5 mg/5 ml Elixir, 80 ml Nystatin 100,000 Unit suspension, 80 ml Prednisolone 15 mg/37m, 80 ml Distilled Water.  Sig: Swish/Swallow 5-10 ml four times a day as needed. Dispense 480 ml. 3RFs 480 mL 3  . midodrine (PROAMATINE) 10 MG tablet Take 1 tablet (10 mg total) 3 (three) times daily with meals by mouth.    . ondansetron (ZOFRAN-ODT) 4 MG  disintegrating tablet Take 4 mg by mouth every 8 (eight) hours as needed for nausea or vomiting.    Marland Kitchen oxyCODONE (OXY IR/ROXICODONE) 5 MG immediate release tablet Take 1 tablet (5 mg total) every 6 (six) hours as needed by mouth for moderate pain. 30 tablet 0  . polyethylene glycol (MIRALAX / GLYCOLAX) packet Take 17 g by mouth daily as needed for mild constipation. 14 each 1  . predniSONE (STERAPRED UNI-PAK 21 TAB) 10 MG (21) TBPK tablet Take as directed. 21 tablet 0  . prochlorperazine (COMPAZINE) 10 MG tablet Take 1 tablet (10 mg total) by mouth every 6 (six) hours as needed (Nausea or vomiting). 30 tablet 1  . rosuvastatin (CRESTOR) 20 MG tablet TAKE 1 TABLET(20 MG) BY MOUTH AT BEDTIME 90 tablet 0  . sertraline (ZOLOFT) 100 MG tablet Take 100 mg by mouth daily at 12 noon.      No current facility-administered medications for this visit.       Marland Kitchen  PHYSICAL EXAMINATION: ECOG PERFORMANCE STATUS: 1 - Symptomatic but completely ambulatory  Vitals:   08/25/17 1005  BP: (!) 163/69  Pulse: 76  Resp: 16  Temp: 98.9 F (37.2 C)   Filed Weights   08/25/17 1005  Weight: 225 lb (102.1 kg)    GENERAL: Well-nourished well-developed; Alert, no distress and comfortable.   Obese. Accompanied by her husband. She  is walking by herself. EYES: Positive for pallor. OROPHARYNX: no thrush or ulceration;  NECK: supple, no masses felt LYMPH:  no palpable lymphadenopathy in the cervical, axillary or inguinal regions LUNGS: Decreased breath sounds to auscultation and  No wheeze or crackles HEART/CVS: regular rate & rhythm and no murmurs; No lower extremity edema ABDOMEN: abdomen soft, non-tender and normal bowel sounds; possible splenomegaly. Musculoskeletal:no cyanosis of digits and no clubbing  PSYCH: alert & oriented x 3 with fluent speech NEURO: no focal motor/sensory deficits SKIN:  No skin rash/nodules.  LABORATORY DATA:  I have reviewed the data as listed Lab Results  Component Value Date   WBC 14.7 (H) 08/25/2017   HGB 8.4 (L) 08/25/2017   HCT 26.0 (L) 08/25/2017   MCV 101.9 (H) 08/25/2017   PLT 18 (LL) 08/25/2017   Recent Labs    07/17/17 1229  08/07/17 0919 08/11/17 0948 08/13/17 0838 08/25/17 0938  NA 137   < > 140 141 142 140  K 3.6   < > 4.1 4.0 4.3 4.3  CL 103   < > 105 104 108 105  CO2 24   < > 24 24 25 26   GLUCOSE 114*   < > 131* 132* 142* 136*  BUN 16   < > 20 16 13 17   CREATININE 1.37*   < > 1.27* 1.23* 1.03* 1.28*  CALCIUM 9.2   < > 9.2 9.1 8.9 9.0  GFRNONAA 37*   < > 40* 42* 51* 40*  GFRAA 43*   < > 46* 48* 60* 46*  PROT 8.0   < > 7.5 7.4  --  6.9  ALBUMIN 3.6   < > 3.8 3.8  --  3.8  AST 29   < > 19 20  --  24  ALT 19   < > 16 15  --  27  ALKPHOS 77   < > 81 68  --  62  BILITOT 1.2   < > 1.2 1.1  --  1.1  BILIDIR 0.1  --   --   --   --   --  IBILI 1.1*  --   --   --   --   --    < > = values in this interval not displayed.    RADIOGRAPHIC STUDIES: I have personally reviewed the radiological images as listed and agreed with the findings in the report. Dg Chest 2 View  Result Date: 08/13/2017 CLINICAL DATA:  Shortness of breath beginning this morning. EXAM: CHEST  2 VIEW COMPARISON:  PA and lateral chest 07/17/2017 and 07/29/2016. FINDINGS: Heart size is upper  normal. No consolidative process, pneumothorax or effusion. Aortic atherosclerosis is noted. Ovoid calcifications in the left axilla may be calcified lymph nodes, unchanged. IMPRESSION: No acute disease. Atherosclerosis. Electronically Signed   By: Inge Rise M.D.   On: 08/13/2017 10:39   Ct Chest Wo Contrast  Result Date: 08/25/2017 CLINICAL DATA:  Wheezing and shortness of breath for 5-6 weeks, AML. EXAM: CT CHEST WITHOUT CONTRAST TECHNIQUE: Multidetector CT imaging of the chest was performed following the standard protocol without IV contrast. COMPARISON:  CT abdomen pelvis 07/17/2017 and CT chest 01/23/2015. FINDINGS: Cardiovascular: Atherosclerotic calcification of the arterial vasculature, including moderate involvement of the coronary arteries. Heart size normal. No pericardial effusion. Mediastinum/Nodes: There are numerous mediastinal lymph nodes, none of which meet CT size criteria for pathologic enlargement. Hilar regions are difficult to evaluate without IV contrast. No axillary adenopathy. Calcified left axillary lymph nodes. Esophagus is grossly unremarkable. Lungs/Pleura: Image quality is degraded by respiratory motion. Mid and lower lung zone predominant peribronchovascular ground-glass, nodularity and consolidation. No pleural fluid. Airway is unremarkable. Upper Abdomen: Visualized portions of the liver, adrenal glands, spleen, pancreas and stomach are grossly unremarkable. There are calcified upper abdominal lymph nodes. Lymph node at the diaphragmatic hiatus measures 1.4 cm and in the gastrohepatic ligament, 1.3 cm, similar to 07/17/2017. Musculoskeletal: Degenerative changes in the spine. IMPRESSION: 1. Mid and lower lung zone peribronchovascular ground-glass, nodularity and consolidation, likely due to an infectious bronchiolitis/bronchopneumonia. 2. Upper abdominal adenopathy, as on 07/17/2017. 3. Aortic atherosclerosis (ICD10-170.0). Moderate coronary artery calcification.  Electronically Signed   By: Lorin Picket M.D.   On: 08/25/2017 11:57    ASSESSMENT & PLAN:   Acute myeloid leukemia in relapse ([place]) Bone marrow biopsy September 2018 shows- ACUTE MYELOID LEUKEMIA [38% blasts]; and also the peripheral blood. IDH- positive; currently on Enasidenib 116m/day [started on 10/19; [facility]; [facility]]. Repeat BMBx- 11/12 [[facility]]- increase in blasts [35% to >60%; per report].    #Severe anemia/thrombocytopenia-secondary to # AML; hemoglobin 8.3; platelets 18-hold transfusions today.  # ?  [facility]  For now continue Enasidenib.  However,-below  #Cough shortness of breath/wheezing -not improved on antibiotics prednisone; chest x-ray negative -recommend stat noncontrast CT chest.   #CKD stage III-stable.  # Prophylactic antibiotics- acyclovir/ diflucan,  # weekly cbc- monday & thursdays/ hold tube.   # CT scan stat; follow up with me dec 26th; labs- hold tube.      GCammie Sickle MD 08/25/2017 12:45 PM

## 2017-08-25 NOTE — Telephone Encounter (Signed)
Called patient to make her aware of her lab/MD/ Poss Blood Transfusion per 08/25/17 los. I made her aware that her lab was scheduled for 9:15 , 9:45 MD and Poss Transfusion @ 1:00 Due to Space Availability, She was OK with the times and stated that it was no problem.

## 2017-08-25 NOTE — Telephone Encounter (Signed)
Spoke to patient's husband regarding the results of the CT scan-low-grade pneumonia versus differentiation syndrome.  Recommend antibiotics Levaquin; called into her pharmacy.  If not improved to call us by the end of the week.  Patient sees me in 1 week  FYI-

## 2017-08-27 NOTE — Unmapped (Signed)
Good morning,    Patient Jessica Wilson contacted phone room to cancel their appointment for 08/28/17.  The appointment has been cancelled and rescheduled for 09/18/17.    Cancellation Reason: patient states that she has pneumonia.    Thank You,  Kelli Hope  [facility]   [phone]

## 2017-08-27 NOTE — Unmapped (Signed)
Phoned pt to follow up on report of pneumonia and cancellation for tomorrow. No answer on home phone or mobile, left vm with callback info.

## 2017-08-28 ENCOUNTER — Inpatient Hospital Stay: Payer: Medicare Other | Admitting: *Deleted

## 2017-08-28 ENCOUNTER — Inpatient Hospital Stay: Payer: Medicare Other

## 2017-08-28 ENCOUNTER — Other Ambulatory Visit: Payer: Self-pay | Admitting: *Deleted

## 2017-08-28 DIAGNOSIS — R918 Other nonspecific abnormal finding of lung field: Secondary | ICD-10-CM | POA: Diagnosis not present

## 2017-08-28 DIAGNOSIS — J209 Acute bronchitis, unspecified: Secondary | ICD-10-CM | POA: Diagnosis not present

## 2017-08-28 DIAGNOSIS — I700 Atherosclerosis of aorta: Secondary | ICD-10-CM | POA: Diagnosis not present

## 2017-08-28 DIAGNOSIS — R5381 Other malaise: Secondary | ICD-10-CM | POA: Diagnosis not present

## 2017-08-28 DIAGNOSIS — J440 Chronic obstructive pulmonary disease with acute lower respiratory infection: Secondary | ICD-10-CM | POA: Diagnosis not present

## 2017-08-28 DIAGNOSIS — J029 Acute pharyngitis, unspecified: Secondary | ICD-10-CM | POA: Diagnosis not present

## 2017-08-28 DIAGNOSIS — D696 Thrombocytopenia, unspecified: Secondary | ICD-10-CM | POA: Diagnosis not present

## 2017-08-28 DIAGNOSIS — E1122 Type 2 diabetes mellitus with diabetic chronic kidney disease: Secondary | ICD-10-CM | POA: Diagnosis not present

## 2017-08-28 DIAGNOSIS — R110 Nausea: Secondary | ICD-10-CM | POA: Diagnosis not present

## 2017-08-28 DIAGNOSIS — C9202 Acute myeloblastic leukemia, in relapse: Secondary | ICD-10-CM

## 2017-08-28 DIAGNOSIS — R6883 Chills (without fever): Secondary | ICD-10-CM | POA: Diagnosis not present

## 2017-08-28 DIAGNOSIS — Z794 Long term (current) use of insulin: Secondary | ICD-10-CM | POA: Diagnosis not present

## 2017-08-28 DIAGNOSIS — E785 Hyperlipidemia, unspecified: Secondary | ICD-10-CM | POA: Diagnosis not present

## 2017-08-28 DIAGNOSIS — N183 Chronic kidney disease, stage 3 (moderate): Secondary | ICD-10-CM | POA: Diagnosis not present

## 2017-08-28 DIAGNOSIS — J189 Pneumonia, unspecified organism: Secondary | ICD-10-CM | POA: Diagnosis not present

## 2017-08-28 DIAGNOSIS — D46Z Other myelodysplastic syndromes: Secondary | ICD-10-CM

## 2017-08-28 DIAGNOSIS — R231 Pallor: Secondary | ICD-10-CM | POA: Diagnosis not present

## 2017-08-28 DIAGNOSIS — R161 Splenomegaly, not elsewhere classified: Secondary | ICD-10-CM | POA: Diagnosis not present

## 2017-08-28 DIAGNOSIS — R5383 Other fatigue: Secondary | ICD-10-CM | POA: Diagnosis not present

## 2017-08-28 DIAGNOSIS — D4622 Refractory anemia with excess of blasts 2: Secondary | ICD-10-CM | POA: Diagnosis not present

## 2017-08-28 DIAGNOSIS — R05 Cough: Secondary | ICD-10-CM | POA: Diagnosis not present

## 2017-08-28 DIAGNOSIS — I129 Hypertensive chronic kidney disease with stage 1 through stage 4 chronic kidney disease, or unspecified chronic kidney disease: Secondary | ICD-10-CM | POA: Diagnosis not present

## 2017-08-28 DIAGNOSIS — D649 Anemia, unspecified: Secondary | ICD-10-CM | POA: Diagnosis not present

## 2017-08-28 DIAGNOSIS — R062 Wheezing: Secondary | ICD-10-CM | POA: Diagnosis not present

## 2017-08-28 DIAGNOSIS — R0602 Shortness of breath: Secondary | ICD-10-CM | POA: Diagnosis not present

## 2017-08-28 LAB — SAMPLE TO BLOOD BANK

## 2017-08-28 LAB — CBC WITH DIFFERENTIAL/PLATELET
BASOS ABS: 0.2 10*3/uL — AB (ref 0–0.1)
BASOS PCT: 1 %
EOS PCT: 0 %
Eosinophils Absolute: 0 10*3/uL (ref 0–0.7)
HCT: 26.1 % — ABNORMAL LOW (ref 35.0–47.0)
Hemoglobin: 8.3 g/dL — ABNORMAL LOW (ref 12.0–16.0)
Lymphocytes Relative: 17 %
Lymphs Abs: 2.2 10*3/uL (ref 1.0–3.6)
MCH: 33.1 pg (ref 26.0–34.0)
MCHC: 32 g/dL (ref 32.0–36.0)
MCV: 103.4 fL — ABNORMAL HIGH (ref 80.0–100.0)
MONO ABS: 0.6 10*3/uL (ref 0.2–0.9)
Monocytes Relative: 5 %
Neutro Abs: 9.9 10*3/uL — ABNORMAL HIGH (ref 1.4–6.5)
Neutrophils Relative %: 77 %
PLATELETS: 13 10*3/uL — AB (ref 150–400)
RBC: 2.52 MIL/uL — ABNORMAL LOW (ref 3.80–5.20)
RDW: 27.3 % — AB (ref 11.5–14.5)
WBC: 12.8 10*3/uL — AB (ref 3.6–11.0)

## 2017-08-28 MED ORDER — ACETAMINOPHEN 325 MG PO TABS
650.0000 mg | ORAL_TABLET | Freq: Once | ORAL | Status: AC
  Administered 2017-08-28: 650 mg via ORAL
  Filled 2017-08-28: qty 2

## 2017-08-28 MED ORDER — DIPHENHYDRAMINE HCL 25 MG PO CAPS
25.0000 mg | ORAL_CAPSULE | Freq: Once | ORAL | Status: AC
  Administered 2017-08-28: 25 mg via ORAL
  Filled 2017-08-28: qty 1

## 2017-08-28 MED ORDER — SODIUM CHLORIDE 0.9 % IV SOLN
250.0000 mL | Freq: Once | INTRAVENOUS | Status: AC
  Administered 2017-08-28: 250 mL via INTRAVENOUS
  Filled 2017-08-28: qty 250

## 2017-08-28 NOTE — Unmapped (Signed)
Phoned pt to f/u on vm, unable to reach. In addition, phoned Dr Sharlette Dense office, unable to reach on nurse line/appt line/MD line    12/21 4pm: Spoke with pt. She started Levaquin 12/17 for penumonia, dx on CT, as she reports chest xrays were negative. She feels abx are starting to help with main symptom of cough and chest congestion. Denies fevers. Patient has continued idhifa and has been tolerating it well in her opinion. Has next lab check on 12/24 for poss transfusion support. Patient aware to call us if any issues come up prior to next visit on Jan 10th

## 2017-08-29 LAB — BPAM PLATELET PHERESIS
BLOOD PRODUCT EXPIRATION DATE: 201812222359
ISSUE DATE / TIME: 201812201421
UNIT TYPE AND RH: 6200

## 2017-08-29 LAB — PREPARE PLATELET PHERESIS: Unit division: 0

## 2017-08-29 NOTE — Unmapped (Signed)
Patient was trying to return call from Nurse Gwyneth Sprout. Patient stated that she is available all day.

## 2017-08-29 NOTE — Unmapped (Signed)
AOC Triage Note     Patient: Jessica Wilson     Reason for call:  returning call from NN      Time call returned: 1240     Phone Assessment: Asking to speak with [name]   Triage Recommendations: will message NN   Patient Response:thankful   Outstanding tasks: please call [name], she will be available to@ [phone],thanks     Patient Pharmacy has been verified and primary pharmacy has been marked as preferred

## 2017-09-01 ENCOUNTER — Ambulatory Visit: Payer: Medicare Other | Admitting: Internal Medicine

## 2017-09-01 ENCOUNTER — Inpatient Hospital Stay: Payer: Medicare Other | Admitting: *Deleted

## 2017-09-01 ENCOUNTER — Encounter: Payer: Self-pay | Admitting: Internal Medicine

## 2017-09-01 ENCOUNTER — Inpatient Hospital Stay (HOSPITAL_BASED_OUTPATIENT_CLINIC_OR_DEPARTMENT_OTHER): Payer: Medicare Other | Admitting: Internal Medicine

## 2017-09-01 ENCOUNTER — Other Ambulatory Visit: Payer: Medicare Other

## 2017-09-01 ENCOUNTER — Other Ambulatory Visit: Payer: Self-pay

## 2017-09-01 VITALS — BP 134/75 | HR 77 | Temp 97.8°F | Resp 20

## 2017-09-01 DIAGNOSIS — J189 Pneumonia, unspecified organism: Secondary | ICD-10-CM | POA: Diagnosis not present

## 2017-09-01 DIAGNOSIS — R062 Wheezing: Secondary | ICD-10-CM | POA: Diagnosis not present

## 2017-09-01 DIAGNOSIS — Z881 Allergy status to other antibiotic agents status: Secondary | ICD-10-CM

## 2017-09-01 DIAGNOSIS — Z792 Long term (current) use of antibiotics: Secondary | ICD-10-CM

## 2017-09-01 DIAGNOSIS — C9202 Acute myeloblastic leukemia, in relapse: Secondary | ICD-10-CM

## 2017-09-01 DIAGNOSIS — E785 Hyperlipidemia, unspecified: Secondary | ICD-10-CM

## 2017-09-01 DIAGNOSIS — Z87891 Personal history of nicotine dependence: Secondary | ICD-10-CM

## 2017-09-01 DIAGNOSIS — Z7952 Long term (current) use of systemic steroids: Secondary | ICD-10-CM

## 2017-09-01 DIAGNOSIS — E1122 Type 2 diabetes mellitus with diabetic chronic kidney disease: Secondary | ICD-10-CM | POA: Diagnosis not present

## 2017-09-01 DIAGNOSIS — I129 Hypertensive chronic kidney disease with stage 1 through stage 4 chronic kidney disease, or unspecified chronic kidney disease: Secondary | ICD-10-CM

## 2017-09-01 DIAGNOSIS — R918 Other nonspecific abnormal finding of lung field: Secondary | ICD-10-CM | POA: Diagnosis not present

## 2017-09-01 DIAGNOSIS — R5383 Other fatigue: Secondary | ICD-10-CM | POA: Diagnosis not present

## 2017-09-01 DIAGNOSIS — Z79899 Other long term (current) drug therapy: Secondary | ICD-10-CM

## 2017-09-01 DIAGNOSIS — D696 Thrombocytopenia, unspecified: Secondary | ICD-10-CM

## 2017-09-01 DIAGNOSIS — R231 Pallor: Secondary | ICD-10-CM

## 2017-09-01 DIAGNOSIS — R161 Splenomegaly, not elsewhere classified: Secondary | ICD-10-CM | POA: Diagnosis not present

## 2017-09-01 DIAGNOSIS — C9200 Acute myeloblastic leukemia, not having achieved remission: Secondary | ICD-10-CM

## 2017-09-01 DIAGNOSIS — K219 Gastro-esophageal reflux disease without esophagitis: Secondary | ICD-10-CM

## 2017-09-01 DIAGNOSIS — I700 Atherosclerosis of aorta: Secondary | ICD-10-CM | POA: Diagnosis not present

## 2017-09-01 DIAGNOSIS — R0602 Shortness of breath: Secondary | ICD-10-CM

## 2017-09-01 DIAGNOSIS — J209 Acute bronchitis, unspecified: Secondary | ICD-10-CM | POA: Diagnosis not present

## 2017-09-01 DIAGNOSIS — R6883 Chills (without fever): Secondary | ICD-10-CM | POA: Diagnosis not present

## 2017-09-01 DIAGNOSIS — N183 Chronic kidney disease, stage 3 (moderate): Secondary | ICD-10-CM

## 2017-09-01 DIAGNOSIS — D649 Anemia, unspecified: Secondary | ICD-10-CM | POA: Diagnosis not present

## 2017-09-01 DIAGNOSIS — J029 Acute pharyngitis, unspecified: Secondary | ICD-10-CM | POA: Diagnosis not present

## 2017-09-01 DIAGNOSIS — R05 Cough: Secondary | ICD-10-CM | POA: Diagnosis not present

## 2017-09-01 DIAGNOSIS — D4622 Refractory anemia with excess of blasts 2: Secondary | ICD-10-CM | POA: Diagnosis not present

## 2017-09-01 DIAGNOSIS — R110 Nausea: Secondary | ICD-10-CM | POA: Diagnosis not present

## 2017-09-01 DIAGNOSIS — Z794 Long term (current) use of insulin: Secondary | ICD-10-CM

## 2017-09-01 DIAGNOSIS — R5381 Other malaise: Secondary | ICD-10-CM | POA: Diagnosis not present

## 2017-09-01 DIAGNOSIS — J440 Chronic obstructive pulmonary disease with acute lower respiratory infection: Secondary | ICD-10-CM | POA: Diagnosis not present

## 2017-09-01 LAB — CBC WITH DIFFERENTIAL/PLATELET
Basophils Absolute: 0.2 10*3/uL — ABNORMAL HIGH (ref 0–0.1)
Basophils Relative: 2 %
Eosinophils Absolute: 0.4 10*3/uL (ref 0–0.7)
Eosinophils Relative: 4 %
HCT: 25 % — ABNORMAL LOW (ref 35.0–47.0)
Hemoglobin: 8.2 g/dL — ABNORMAL LOW (ref 12.0–16.0)
Lymphocytes Relative: 23 %
Lymphs Abs: 2.3 10*3/uL (ref 1.0–3.6)
MCH: 34.2 pg — ABNORMAL HIGH (ref 26.0–34.0)
MCHC: 33 g/dL (ref 32.0–36.0)
MCV: 103.8 fL — ABNORMAL HIGH (ref 80.0–100.0)
Monocytes Absolute: 0.4 10*3/uL (ref 0.2–0.9)
Monocytes Relative: 4 %
Neutro Abs: 6.5 10*3/uL (ref 1.4–6.5)
Neutrophils Relative %: 67 %
Platelets: 18 10*3/uL — CL (ref 150–400)
RBC: 2.41 MIL/uL — ABNORMAL LOW (ref 3.80–5.20)
RDW: 27.5 % — ABNORMAL HIGH (ref 11.5–14.5)
WBC: 9.8 10*3/uL (ref 3.6–11.0)
nRBC: 3 /100 WBC — ABNORMAL HIGH

## 2017-09-01 LAB — SAMPLE TO BLOOD BANK

## 2017-09-01 NOTE — Progress Notes (Signed)
[facility] NOTE  Patient Care Team: Roselee Nova, [name]  CHIEF COMPLAINTS/PURPOSE OF CONSULTATION:   Oncology History   #JUNE 2017- Severe neutropenia/ Anemia ~hb 9/platlets- 85-100 AUG 2017- REFRACTORY ANEMIA with EXCESS BLASTS [14% blasts- BMBx]; cytogenetics/FISH-N [SNP micorarray- not done]   # AUG 21st 2017-  START AZA 60m/m2 day- 1-7 q 28 days x4 cycles; DEC 6th- BMBx- <5% blasts; hypercellular with dysplasia.   # SEP 20th 2018- ACUTE MYELOID LEUKEMIA [38% blasts on BMBx]; IDH-MUTATION PRESENT; low FLT-3; OCT 18th 2018- ENASIDENIB [Dr.Foster; [facility]]   # CKD stage III  ------------------------------------------------------   MOLECULAR TESTING: NGS sent 06/2017 -FLT3-ITD <0.05 mutational burden -IDH2 -DNMT3A --treatment options include FLT3 inhibition and IDH2 inhibition; given low level FYYT0-PTWallelic ratio [admittedly in peripheral blood, not bone marrow]- started on ENASIDENIB     MDS (myelodysplastic syndrome), high grade ([facility]   04/23/2016 Initial Diagnosis    MDS (myelodysplastic syndrome), high grade (HCC)       Acute myeloid leukemia in relapse (HCC)  At    HISTORY OF PRESENTING ILLNESS:  Teresa 89.  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  Patient had a recent CT scan of the chest for worsening shortness of breath/cough-that showed bilateral mild/infiltrate suggestive of bronchopneumonia.  She is on Levaquin; status post prednisone symptoms have improved.   Patient continues to be on Enasidenib.No nausea no vomiting or diarrhea.  Chronic mild fatigue.  Denies any fevers or chills. Denies any gum bleeding.  Denies any significant weight gain or weight loss.  ROS: A complete 10 point review of system is done which is negative except mentioned above in history of present illness.  MEDICAL HISTORY:   Past Medical History:  Diagnosis Date  . Abdominal wall mass   . Anginal pain ([place]   . Anxiety   . Arthritis   . Calculus of kidney   . Cystitis   . Depression   . Diabetes mellitus without complication (HCC)    elevated A1c  . Dyspnea on exertion   . Elevated serum creatinine   . Fibrocystic breast disease   . GERD (gastroesophageal reflux disease)   . Hearing loss   . Heart murmur   . HTN (hypertension)   . Hyperlipidemia   . MDS (myelodysplastic syndrome), high grade ([place] 04/23/2016  . Microscopic hematuria   . Mouth sores   . Mucositis due to chemotherapy   . Obesity   . Risk for falls   . Sleep apnea   . Thrombocytopenia ([place]   . Urinary frequency   . Urinary urgency     SURGICAL HISTORY: Past Surgical History:  Procedure Laterality Date  . ABDOMINAL HYSTERECTOMY    . APPENDECTOMY    . CARDIAC CATHETERIZATION     x2  . CHOLECYSTECTOMY    . COLONOSCOPY N/A 02/24/2015   Procedure: COLONOSCOPY;  Surgeon: RManya Silvas MD;  [facility]  Service: Endoscopy;  Laterality: N/A;  . DIAGNOSTIC LAPAROSCOPY     Removal of benign abdominal tumor  . ESOPHAGOGASTRODUODENOSCOPY N/A 02/24/2015   Procedure: ESOPHAGOGASTRODUODENOSCOPY (EGD);  Surgeon: RManya Silvas MD;  [facility];  Service: Endoscopy;  Laterality: N/A;  . right eye surgery Right     SOCIAL HISTORY: [place] family; [place] No smoking/ no alcohol.  Social History   Socioeconomic History  .  Marital status: Married    Spouse name: Not on file  . Number of children: Not on file  . Years of education: Not on file  . Highest education level: Not on file  Social Needs  . Financial resource strain: Not on file  . Food insecurity - worry: Not on file  . Food insecurity - inability: Not on file  . Transportation needs - medical: Not on file  . Transportation needs - non-medical: Not on file  Occupational History  . Not on file  Tobacco Use   . Smoking status: Former Smoker    Types: Cigarettes    Last attempt to quit: 09/09/1988    Years since quitting: 28.9  . Smokeless tobacco: Never Used  . Tobacco comment: quit 25 years ago  Substance and Sexual Activity  . Alcohol use: No    Alcohol/week: 0.0 oz  . Drug use: No  . Sexual activity: Not Currently    Birth control/protection: Post-menopausal  Other Topics Concern  . Not on file  Social History Narrative  . Not on file    FAMILY HISTORY: no cancers in family.  Family History  Problem Relation Age of Onset  . Congestive Heart Failure Mother   . Diabetes Mother   . Coronary artery disease Mother   . Stroke Mother   . Cirrhosis Father     ALLERGIES:  is allergic to macrobid [nitrofurantoin monohyd macro].  MEDICATIONS:  Current Outpatient Medications  Medication Sig Dispense Refill  . albuterol (PROVENTIL HFA) 108 (90 Base) MCG/ACT inhaler Inhale 1 puff into the lungs every 6 (six) hours as needed for shortness of breath.     Marland Kitchen albuterol (PROVENTIL HFA;VENTOLIN HFA) 108 (90 Base) MCG/ACT inhaler Inhale 2 puffs into the lungs every 6 (six) hours as needed for wheezing or shortness of breath. 1 Inhaler 2  . buPROPion (WELLBUTRIN XL) 150 MG 24 hr tablet Take 300 mg by mouth daily at 12 noon.     . clonazePAM (KLONOPIN) 0.5 MG tablet Take 0.5 mg by mouth 2 (two) times daily.     Marland Kitchen diltiazem (CARDIZEM CD) 180 MG 24 hr capsule Take 1 capsule (180 mg total) by mouth daily. 30 capsule 0  . hydrocortisone (ANUSOL-HC) 2.5 % rectal cream Place 1 application rectally 2 (two) times daily as needed for hemorrhoids or itching. 30 g 0  . insulin aspart (NOVOLOG) 100 UNIT/ML injection Inject 0-9 Units 3 (three) times daily with meals into the skin. 10 mL 11  . ipratropium (ATROVENT HFA) 17 MCG/ACT inhaler Inhale 2 puffs into the lungs every 4 (four) hours as needed for wheezing. 1 Inhaler 12  . levofloxacin (LEVAQUIN) 500 MG tablet Take 1 tablet (500 mg total) by mouth daily. 14  tablet 0  . magic mouthwash w/lidocaine SOLN Take 5 mLs by mouth 4 (four) times daily. 80 ml viscous lidocaine 2%, 80 ml Mylanta, 80 ml Diphenhydramine 12.5 mg/5 ml Elixir, 80 ml Nystatin 100,000 Unit suspension, 80 ml Prednisolone 15 mg/28m, 80 ml Distilled Water.  Sig: Swish/Swallow 5-10 ml four times a day as needed. Dispense 480 ml. 3RFs 480 mL 3  . rosuvastatin (CRESTOR) 20 MG tablet TAKE 1 TABLET(20 MG) BY MOUTH AT BEDTIME 90 tablet 0  . sertraline (ZOLOFT) 100 MG tablet Take 100 mg by mouth daily at 12 noon.     . benzonatate (TESSALON) 100 MG capsule Take 1 capsule (100 mg total) by mouth 3 (three) times daily as needed for cough. (Patient not taking: Reported on  09/01/2017) 30 capsule 0  . midodrine (PROAMATINE) 10 MG tablet Take 1 tablet (10 mg total) 3 (three) times daily with meals by mouth.    . ondansetron (ZOFRAN-ODT) 4 MG disintegrating tablet Take 4 mg by mouth every 8 (eight) hours as needed for nausea or vomiting.    Marland Kitchen oxyCODONE (OXY IR/ROXICODONE) 5 MG immediate release tablet Take 1 tablet (5 mg total) every 6 (six) hours as needed by mouth for moderate pain. (Patient not taking: Reported on 09/01/2017) 30 tablet 0  . polyethylene glycol (MIRALAX / GLYCOLAX) packet Take 17 g by mouth daily as needed for mild constipation. (Patient not taking: Reported on 09/01/2017) 14 each 1  . prochlorperazine (COMPAZINE) 10 MG tablet Take 1 tablet (10 mg total) by mouth every 6 (six) hours as needed (Nausea or vomiting). (Patient not taking: Reported on 09/01/2017) 30 tablet 1   No current facility-administered medications for this visit.       Marland Kitchen  PHYSICAL EXAMINATION: ECOG PERFORMANCE STATUS: 1 - Symptomatic but completely ambulatory  Vitals:   09/01/17 0945  BP: 134/75  Pulse: 77  Resp: 20  Temp: 97.8 F (36.6 C)   There were no vitals filed for this visit.  GENERAL: Well-nourished well-developed; Alert, no distress and comfortable.   Obese. Accompanied by her husband. She is  walking by herself. EYES: Positive for pallor. OROPHARYNX: no thrush or ulceration;  NECK: supple, no masses felt LYMPH:  no palpable lymphadenopathy in the cervical, axillary or inguinal regions LUNGS: Decreased breath sounds to auscultation and  No wheeze or crackles HEART/CVS: regular rate & rhythm and no murmurs; No lower extremity edema ABDOMEN: abdomen soft, non-tender and normal bowel sounds; possible splenomegaly. Musculoskeletal:no cyanosis of digits and no clubbing  PSYCH: alert & oriented x 3 with fluent speech NEURO: no focal motor/sensory deficits SKIN:  No skin rash/nodules.  LABORATORY DATA:  I have reviewed the data as listed Lab Results  Component Value Date   WBC 9.8 09/01/2017   HGB 8.2 (L) 09/01/2017   HCT 25.0 (L) 09/01/2017   MCV 103.8 (H) 09/01/2017   PLT 18 (LL) 09/01/2017   Recent Labs    07/17/17 1229  08/07/17 0919 08/11/17 0948 08/13/17 0838 08/25/17 0938  NA 137   < > 140 141 142 140  K 3.6   < > 4.1 4.0 4.3 4.3  CL 103   < > 105 104 108 105  CO2 24   < > 24 24 25 26   GLUCOSE 114*   < > 131* 132* 142* 136*  BUN 16   < > 20 16 13 17   CREATININE 1.37*   < > 1.27* 1.23* 1.03* 1.28*  CALCIUM 9.2   < > 9.2 9.1 8.9 9.0  GFRNONAA 37*   < > 40* 42* 51* 40*  GFRAA 43*   < > 46* 48* 60* 46*  PROT 8.0   < > 7.5 7.4  --  6.9  ALBUMIN 3.6   < > 3.8 3.8  --  3.8  AST 29   < > 19 20  --  24  ALT 19   < > 16 15  --  27  ALKPHOS 77   < > 81 68  --  62  BILITOT 1.2   < > 1.2 1.1  --  1.1  BILIDIR 0.1  --   --   --   --   --   IBILI 1.1*  --   --   --   --   --    < > =  values in this interval not displayed.    RADIOGRAPHIC STUDIES: I have personally reviewed the radiological images as listed and agreed with the findings in the report. Dg Chest 2 View  Result Date: 08/13/2017 CLINICAL DATA:  Shortness of breath beginning this morning. EXAM: CHEST  2 VIEW COMPARISON:  PA and lateral chest 07/17/2017 and 07/29/2016. FINDINGS: Heart size is upper normal.  No consolidative process, pneumothorax or effusion. Aortic atherosclerosis is noted. Ovoid calcifications in the left axilla may be calcified lymph nodes, unchanged. IMPRESSION: No acute disease. Atherosclerosis. Electronically Signed   By: Inge Rise M.D.   On: 08/13/2017 10:39   Ct Chest Wo Contrast  Result Date: 08/25/2017 CLINICAL DATA:  Wheezing and shortness of breath for 5-6 weeks, AML. EXAM: CT CHEST WITHOUT CONTRAST TECHNIQUE: Multidetector CT imaging of the chest was performed following the standard protocol without IV contrast. COMPARISON:  CT abdomen pelvis 07/17/2017 and CT chest 01/23/2015. FINDINGS: Cardiovascular: Atherosclerotic calcification of the arterial vasculature, including moderate involvement of the coronary arteries. Heart size normal. No pericardial effusion. Mediastinum/Nodes: There are numerous mediastinal lymph nodes, none of which meet CT size criteria for pathologic enlargement. Hilar regions are difficult to evaluate without IV contrast. No axillary adenopathy. Calcified left axillary lymph nodes. Esophagus is grossly unremarkable. Lungs/Pleura: Image quality is degraded by respiratory motion. Mid and lower lung zone predominant peribronchovascular ground-glass, nodularity and consolidation. No pleural fluid. Airway is unremarkable. Upper Abdomen: Visualized portions of the liver, adrenal glands, spleen, pancreas and stomach are grossly unremarkable. There are calcified upper abdominal lymph nodes. Lymph node at the diaphragmatic hiatus measures 1.4 cm and in the gastrohepatic ligament, 1.3 cm, similar to 07/17/2017. Musculoskeletal: Degenerative changes in the spine. IMPRESSION: 1. Mid and lower lung zone peribronchovascular ground-glass, nodularity and consolidation, likely due to an infectious bronchiolitis/bronchopneumonia. 2. Upper abdominal adenopathy, as on 07/17/2017. 3. Aortic atherosclerosis (ICD10-170.0). Moderate coronary artery calcification. Electronically  Signed   By: Lorin Picket M.D.   On: 08/25/2017 11:57   IMPRESSION: 1. Mid and lower lung zone peribronchovascular ground-glass, nodularity and consolidation, likely due to an infectious bronchiolitis/bronchopneumonia. 2. Upper abdominal adenopathy, as on 07/17/2017. 3. Aortic atherosclerosis (ICD10-170.0). Moderate coronary artery calcification.   Electronically Signed   By: Lorin Picket M.D.   On: 08/25/2017 11:57 ASSESSMENT & PLAN:   Acute myeloid leukemia in relapse ([place]) Bone marrow biopsy September 2018 shows- ACUTE MYELOID LEUKEMIA [38% blasts]; and also the peripheral blood. IDH- positive; currently on Enasidenib 193m/day [started on 10/19; [facility]; Dr.Foster]. Repeat BMBx- 11/12 [[facility]]- increase in blasts [35% to >60%; per report].    #Severe anemia/thrombocytopenia-secondary to # AML; hemoglobin 8.3; platelets 18-hold transfusions today.  # ?  [facility]  For now continue Enasidenib.  However,-below  #Cough shortness of breath/wheezing likely broncho-pneumonia on CT scan; s/p Levaquin; prednisone. Improved.   #CKD stage III-stable.  # Prophylactic antibiotics- acyclovir BID.   # weekly cbc- monday & thursdays/ hold tube. Follow up with MDin 3 weeks.  Proceed with platelet transfusion on 12/26-given the lack of availability of chairs.     GCammie Sickle MD 09/01/2017 12:38 PM

## 2017-09-01 NOTE — Assessment & Plan Note (Addendum)
Bone marrow biopsy September 2018 shows- ACUTE MYELOID LEUKEMIA [38% blasts]; and also the peripheral blood. IDH- positive; currently on Enasidenib 171m/day [started on 10/19; [facility]; Dr.Foster]. Repeat BMBx- 11/12 [[facility]]- increase in blasts [35% to >60%; per report].    #Severe anemia/thrombocytopenia-secondary to # AML; hemoglobin 8.3; platelets 18-hold transfusions today.  # ?  [facility]  For now continue Enasidenib.  However,-below  #Cough shortness of breath/wheezing likely broncho-pneumonia on CT scan; s/p Levaquin; prednisone. Improved.   #CKD stage III-stable.  # Prophylactic antibiotics- acyclovir BID.   # weekly cbc- monday & thursdays/ hold tube. Follow up with MDin 3 weeks.  Proceed with platelet transfusion on 12/26-given the lack of availability of chairs.

## 2017-09-03 ENCOUNTER — Inpatient Hospital Stay: Payer: Medicare Other

## 2017-09-03 ENCOUNTER — Inpatient Hospital Stay: Payer: Medicare Other | Admitting: Internal Medicine

## 2017-09-03 ENCOUNTER — Telehealth: Payer: Self-pay | Admitting: *Deleted

## 2017-09-03 ENCOUNTER — Other Ambulatory Visit: Payer: Self-pay | Admitting: *Deleted

## 2017-09-03 DIAGNOSIS — D696 Thrombocytopenia, unspecified: Secondary | ICD-10-CM | POA: Diagnosis not present

## 2017-09-03 DIAGNOSIS — J440 Chronic obstructive pulmonary disease with acute lower respiratory infection: Secondary | ICD-10-CM | POA: Diagnosis not present

## 2017-09-03 DIAGNOSIS — R0602 Shortness of breath: Secondary | ICD-10-CM | POA: Diagnosis not present

## 2017-09-03 DIAGNOSIS — D46Z Other myelodysplastic syndromes: Secondary | ICD-10-CM

## 2017-09-03 DIAGNOSIS — R05 Cough: Secondary | ICD-10-CM | POA: Diagnosis not present

## 2017-09-03 DIAGNOSIS — R5383 Other fatigue: Secondary | ICD-10-CM | POA: Diagnosis not present

## 2017-09-03 DIAGNOSIS — R231 Pallor: Secondary | ICD-10-CM | POA: Diagnosis not present

## 2017-09-03 DIAGNOSIS — J209 Acute bronchitis, unspecified: Secondary | ICD-10-CM | POA: Diagnosis not present

## 2017-09-03 DIAGNOSIS — J189 Pneumonia, unspecified organism: Secondary | ICD-10-CM | POA: Diagnosis not present

## 2017-09-03 DIAGNOSIS — R110 Nausea: Secondary | ICD-10-CM | POA: Diagnosis not present

## 2017-09-03 DIAGNOSIS — R918 Other nonspecific abnormal finding of lung field: Secondary | ICD-10-CM | POA: Diagnosis not present

## 2017-09-03 DIAGNOSIS — E1122 Type 2 diabetes mellitus with diabetic chronic kidney disease: Secondary | ICD-10-CM | POA: Diagnosis not present

## 2017-09-03 DIAGNOSIS — I700 Atherosclerosis of aorta: Secondary | ICD-10-CM | POA: Diagnosis not present

## 2017-09-03 DIAGNOSIS — E785 Hyperlipidemia, unspecified: Secondary | ICD-10-CM | POA: Diagnosis not present

## 2017-09-03 DIAGNOSIS — R6883 Chills (without fever): Secondary | ICD-10-CM | POA: Diagnosis not present

## 2017-09-03 DIAGNOSIS — D4622 Refractory anemia with excess of blasts 2: Secondary | ICD-10-CM | POA: Diagnosis not present

## 2017-09-03 DIAGNOSIS — N183 Chronic kidney disease, stage 3 (moderate): Secondary | ICD-10-CM | POA: Diagnosis not present

## 2017-09-03 DIAGNOSIS — R062 Wheezing: Secondary | ICD-10-CM | POA: Diagnosis not present

## 2017-09-03 DIAGNOSIS — Z794 Long term (current) use of insulin: Secondary | ICD-10-CM | POA: Diagnosis not present

## 2017-09-03 DIAGNOSIS — C9202 Acute myeloblastic leukemia, in relapse: Secondary | ICD-10-CM | POA: Diagnosis not present

## 2017-09-03 DIAGNOSIS — R161 Splenomegaly, not elsewhere classified: Secondary | ICD-10-CM | POA: Diagnosis not present

## 2017-09-03 DIAGNOSIS — J029 Acute pharyngitis, unspecified: Secondary | ICD-10-CM | POA: Diagnosis not present

## 2017-09-03 DIAGNOSIS — R5381 Other malaise: Secondary | ICD-10-CM | POA: Diagnosis not present

## 2017-09-03 DIAGNOSIS — I129 Hypertensive chronic kidney disease with stage 1 through stage 4 chronic kidney disease, or unspecified chronic kidney disease: Secondary | ICD-10-CM | POA: Diagnosis not present

## 2017-09-03 DIAGNOSIS — D649 Anemia, unspecified: Secondary | ICD-10-CM | POA: Diagnosis not present

## 2017-09-03 LAB — CBC WITH DIFFERENTIAL/PLATELET
BASOS ABS: 0 10*3/uL (ref 0–0.1)
BASOS PCT: 1 %
Eosinophils Absolute: 0 10*3/uL (ref 0–0.7)
Eosinophils Relative: 0 %
HEMATOCRIT: 23.8 % — AB (ref 35.0–47.0)
HEMOGLOBIN: 7.8 g/dL — AB (ref 12.0–16.0)
LYMPHS PCT: 23 %
Lymphs Abs: 1.4 10*3/uL (ref 1.0–3.6)
MCH: 33.7 pg (ref 26.0–34.0)
MCHC: 32.9 g/dL (ref 32.0–36.0)
MCV: 102.5 fL — AB (ref 80.0–100.0)
MONO ABS: 0.4 10*3/uL (ref 0.2–0.9)
MONOS PCT: 6 %
NEUTROS ABS: 4.1 10*3/uL (ref 1.4–6.5)
NEUTROS PCT: 70 %
Platelets: 14 10*3/uL — CL (ref 150–400)
RBC: 2.33 MIL/uL — ABNORMAL LOW (ref 3.80–5.20)
RDW: 27.1 % — ABNORMAL HIGH (ref 11.5–14.5)
WBC: 5.9 10*3/uL (ref 3.6–11.0)

## 2017-09-03 LAB — SAMPLE TO BLOOD BANK

## 2017-09-03 NOTE — Telephone Encounter (Signed)
1448-Lonnie from [facility] lab contacted [name], RN- critical value plt count of 14.  hgb is 7.8.  Read back process performed with lab tech Dr. Rogue Bussing informed. 1500 of critical value- orders obtained for 1 unit of plts.

## 2017-09-04 ENCOUNTER — Ambulatory Visit
Admission: RE | Admit: 2017-09-04 | Discharge: 2017-09-04 | Disposition: A | Discharge status: Home / Self Care | Payer: Medicare Other | Source: Ambulatory Visit | Attending: Internal Medicine | Admitting: Internal Medicine

## 2017-09-04 DIAGNOSIS — D46Z Other myelodysplastic syndromes: Secondary | ICD-10-CM | POA: Insufficient documentation

## 2017-09-04 DIAGNOSIS — D696 Thrombocytopenia, unspecified: Secondary | ICD-10-CM | POA: Diagnosis not present

## 2017-09-04 MED ORDER — ACETAMINOPHEN 325 MG PO TABS
ORAL_TABLET | ORAL | Status: AC
  Filled 2017-09-04: qty 2

## 2017-09-04 MED ORDER — ACETAMINOPHEN 325 MG PO TABS
650.0000 mg | ORAL_TABLET | Freq: Once | ORAL | Status: AC
  Administered 2017-09-04: 650 mg via ORAL

## 2017-09-04 MED ORDER — SODIUM CHLORIDE 0.9% FLUSH: 10.0000 mL | INTRAVENOUS | Status: DC | PRN

## 2017-09-04 MED ORDER — DIPHENHYDRAMINE HCL 25 MG PO CAPS
25.0000 mg | ORAL_CAPSULE | Freq: Once | ORAL | Status: AC
  Administered 2017-09-04: 25 mg via ORAL

## 2017-09-04 MED ORDER — DIPHENHYDRAMINE HCL 25 MG PO CAPS
ORAL_CAPSULE | ORAL | Status: AC
  Filled 2017-09-04: qty 1

## 2017-09-04 MED ORDER — SODIUM CHLORIDE 0.9 % IV SOLN: 250.0000 mL | Freq: Once | INTRAVENOUS | Status: DC

## 2017-09-05 LAB — BPAM PLATELET PHERESIS
Blood Product Expiration Date: 201812292359
ISSUE DATE / TIME: 201812271347
Unit Type and Rh: 6200

## 2017-09-05 LAB — PREPARE PLATELET PHERESIS: UNIT DIVISION: 0

## 2017-09-08 ENCOUNTER — Inpatient Hospital Stay: Payer: Medicare Other

## 2017-09-08 ENCOUNTER — Other Ambulatory Visit: Payer: Self-pay | Admitting: *Deleted

## 2017-09-08 ENCOUNTER — Other Ambulatory Visit: Payer: Self-pay

## 2017-09-08 ENCOUNTER — Encounter: Payer: Self-pay | Admitting: Nurse Practitioner

## 2017-09-08 ENCOUNTER — Inpatient Hospital Stay (HOSPITAL_BASED_OUTPATIENT_CLINIC_OR_DEPARTMENT_OTHER): Payer: Medicare Other | Admitting: Nurse Practitioner

## 2017-09-08 VITALS — BP 134/81 | HR 80 | Temp 97.8°F | Resp 20

## 2017-09-08 DIAGNOSIS — I700 Atherosclerosis of aorta: Secondary | ICD-10-CM

## 2017-09-08 DIAGNOSIS — R161 Splenomegaly, not elsewhere classified: Secondary | ICD-10-CM

## 2017-09-08 DIAGNOSIS — E785 Hyperlipidemia, unspecified: Secondary | ICD-10-CM

## 2017-09-08 DIAGNOSIS — J440 Chronic obstructive pulmonary disease with acute lower respiratory infection: Secondary | ICD-10-CM | POA: Diagnosis not present

## 2017-09-08 DIAGNOSIS — E1122 Type 2 diabetes mellitus with diabetic chronic kidney disease: Secondary | ICD-10-CM

## 2017-09-08 DIAGNOSIS — E669 Obesity, unspecified: Secondary | ICD-10-CM

## 2017-09-08 DIAGNOSIS — J029 Acute pharyngitis, unspecified: Secondary | ICD-10-CM | POA: Diagnosis not present

## 2017-09-08 DIAGNOSIS — D696 Thrombocytopenia, unspecified: Secondary | ICD-10-CM

## 2017-09-08 DIAGNOSIS — I129 Hypertensive chronic kidney disease with stage 1 through stage 4 chronic kidney disease, or unspecified chronic kidney disease: Secondary | ICD-10-CM | POA: Diagnosis not present

## 2017-09-08 DIAGNOSIS — J449 Chronic obstructive pulmonary disease, unspecified: Secondary | ICD-10-CM

## 2017-09-08 DIAGNOSIS — J189 Pneumonia, unspecified organism: Secondary | ICD-10-CM | POA: Diagnosis not present

## 2017-09-08 DIAGNOSIS — R5381 Other malaise: Secondary | ICD-10-CM | POA: Diagnosis not present

## 2017-09-08 DIAGNOSIS — C9202 Acute myeloblastic leukemia, in relapse: Secondary | ICD-10-CM

## 2017-09-08 DIAGNOSIS — R062 Wheezing: Secondary | ICD-10-CM | POA: Diagnosis not present

## 2017-09-08 DIAGNOSIS — R5383 Other fatigue: Secondary | ICD-10-CM | POA: Diagnosis not present

## 2017-09-08 DIAGNOSIS — R6883 Chills (without fever): Secondary | ICD-10-CM | POA: Diagnosis not present

## 2017-09-08 DIAGNOSIS — R231 Pallor: Secondary | ICD-10-CM | POA: Diagnosis not present

## 2017-09-08 DIAGNOSIS — D4622 Refractory anemia with excess of blasts 2: Secondary | ICD-10-CM

## 2017-09-08 DIAGNOSIS — N183 Chronic kidney disease, stage 3 (moderate): Secondary | ICD-10-CM

## 2017-09-08 DIAGNOSIS — Z87891 Personal history of nicotine dependence: Secondary | ICD-10-CM

## 2017-09-08 DIAGNOSIS — D649 Anemia, unspecified: Secondary | ICD-10-CM | POA: Diagnosis not present

## 2017-09-08 DIAGNOSIS — Z794 Long term (current) use of insulin: Secondary | ICD-10-CM | POA: Diagnosis not present

## 2017-09-08 DIAGNOSIS — K219 Gastro-esophageal reflux disease without esophagitis: Secondary | ICD-10-CM

## 2017-09-08 DIAGNOSIS — J209 Acute bronchitis, unspecified: Secondary | ICD-10-CM | POA: Diagnosis not present

## 2017-09-08 DIAGNOSIS — R05 Cough: Secondary | ICD-10-CM | POA: Diagnosis not present

## 2017-09-08 DIAGNOSIS — R0600 Dyspnea, unspecified: Secondary | ICD-10-CM

## 2017-09-08 DIAGNOSIS — Z792 Long term (current) use of antibiotics: Secondary | ICD-10-CM

## 2017-09-08 DIAGNOSIS — R0602 Shortness of breath: Secondary | ICD-10-CM | POA: Diagnosis not present

## 2017-09-08 DIAGNOSIS — Z881 Allergy status to other antibiotic agents status: Secondary | ICD-10-CM

## 2017-09-08 DIAGNOSIS — R110 Nausea: Secondary | ICD-10-CM | POA: Diagnosis not present

## 2017-09-08 DIAGNOSIS — R918 Other nonspecific abnormal finding of lung field: Secondary | ICD-10-CM | POA: Diagnosis not present

## 2017-09-08 DIAGNOSIS — Z79899 Other long term (current) drug therapy: Secondary | ICD-10-CM

## 2017-09-08 DIAGNOSIS — Z7952 Long term (current) use of systemic steroids: Secondary | ICD-10-CM

## 2017-09-08 DIAGNOSIS — D619 Aplastic anemia, unspecified: Secondary | ICD-10-CM

## 2017-09-08 LAB — CBC WITH DIFFERENTIAL/PLATELET
BASOS ABS: 0 10*3/uL (ref 0–0.1)
BASOS PCT: 1 %
EOS ABS: 0 10*3/uL (ref 0–0.7)
Eosinophils Relative: 0 %
HCT: 24.4 % — ABNORMAL LOW (ref 35.0–47.0)
HEMOGLOBIN: 8.1 g/dL — AB (ref 12.0–16.0)
Lymphocytes Relative: 22 %
Lymphs Abs: 1.3 10*3/uL (ref 1.0–3.6)
MCH: 34.5 pg — ABNORMAL HIGH (ref 26.0–34.0)
MCHC: 33.2 g/dL (ref 32.0–36.0)
MCV: 103.9 fL — ABNORMAL HIGH (ref 80.0–100.0)
Monocytes Absolute: 0.3 10*3/uL (ref 0.2–0.9)
Monocytes Relative: 6 %
NEUTROS PCT: 71 %
Neutro Abs: 4 10*3/uL (ref 1.4–6.5)
Platelets: 23 10*3/uL — CL (ref 150–400)
RBC: 2.35 MIL/uL — AB (ref 3.80–5.20)
RDW: 26.4 % — ABNORMAL HIGH (ref 11.5–14.5)
WBC: 5.7 10*3/uL (ref 3.6–11.0)

## 2017-09-08 LAB — SAMPLE TO BLOOD BANK

## 2017-09-08 MED ORDER — IPRATROPIUM-ALBUTEROL 0.5-2.5 (3) MG/3ML IN SOLN
3.0000 mL | Freq: Four times a day (QID) | RESPIRATORY_TRACT | Status: DC
  Administered 2017-09-08: 3 mL via RESPIRATORY_TRACT

## 2017-09-08 MED ORDER — MONTELUKAST SODIUM 10 MG PO TABS: 10.0000 mg | ORAL_TABLET | Freq: Every day | ORAL | 2 refills | Status: DC

## 2017-09-08 MED ORDER — IPRATROPIUM-ALBUTEROL 0.5-2.5 (3) MG/3ML IN SOLN
RESPIRATORY_TRACT | Status: AC
  Filled 2017-09-08: qty 3

## 2017-09-08 NOTE — Progress Notes (Signed)
Critical plt count today-called by Lonnie-lab tech at 0905 am. Read back process performed- plt count 23; hgb 8.1 today. Lauren, NP provided critical value at 0906 am. Read back process performed with NP.  Patient asymptomatic in bleeding. Reports shortness of breath/ongoing cough and wheeze. Just finished a course of Levaquin on Sunday this week.

## 2017-09-08 NOTE — Patient Instructions (Signed)
Montelukast oral tablets What is this medicine? MONTELUKAST (mon te LOO kast) is used to prevent and treat the symptoms of asthma. It is also used to treat allergies. Do not use for an acute asthma attack. This medicine may be used for other purposes; ask your health care provider or pharmacist if you have questions. COMMON BRAND NAME(S): Singulair What should I tell my health care provider before I take this medicine? They need to know if you have any of these conditions: -liver disease -an unusual or allergic reaction to montelukast, other medicines, foods, dyes, or preservatives -pregnant or trying to get pregnant -breast-feeding How should I use this medicine? This medicine should be given by mouth. Follow the directions on the prescription label. Take this medicine at the same time every day. You may take this medicine with or without meals. Do not chew the tablets. Do not stop taking your medicine unless your doctor tells you to. Talk to your pediatrician regarding the use of this medicine in children. Special care may be needed. While this drug may be prescribed for children as young as 15 years of age for selected conditions, precautions do apply. Overdosage: If you think you have taken too much of this medicine contact a poison control center or emergency room at once. NOTE: This medicine is only for you. Do not share this medicine with others. What if I miss a dose? If you miss a dose, take it as soon as you can. If it is almost time for your next dose, take only that dose. Do not take double or extra doses. What may interact with this medicine? -anti-infectives like rifampin and rifabutin -medicines for diabetes like rosiglitazone and repaglinide -medicines for seizures like phenytoin, phenobarbital, and carbamazepine -paclitaxel This list may not describe all possible interactions. Give your health care provider a list of all the medicines, herbs, non-prescription drugs, or  dietary supplements you use. Also tell them if you smoke, drink alcohol, or use illegal drugs. Some items may interact with your medicine. What should I watch for while using this medicine? Visit your doctor or health care professional for regular checks on your progress. Tell your doctor or health care professional if your allergy or asthma symptoms do not improve. Take your medicine even when you do not have symptoms. Do not stop taking any of your medicine(s) unless your doctor tells you to. If you have asthma, talk to your doctor about what to do in an acute asthma attack. Always have your inhaled rescue medicine for asthma attacks with you. Patients and their families should watch for new or worsening thoughts of suicide or depression. Also watch for sudden changes in feelings such as feeling anxious, agitated, panicky, irritable, hostile, aggressive, impulsive, severely restless, overly excited and hyperactive, or not being able to sleep. Any worsening of mood or thoughts of suicide or dying should be reported to your health care professional right away. What side effects may I notice from receiving this medicine? Side effects that you should report to your doctor or health care professional as soon as possible: -allergic reactions like skin rash or hives, or swelling of the face, lips, or tongue -breathing problems -confusion -dark urine -fever or infection -flu-like symptoms -hallucinations -painful lumps under the skin -pain, tingling, numbness in the hands or feet -sinus pain or swelling -suicidal thoughts or other mood changes -tremors -trouble sleeping -uncontrolled muscle movements -unusual bleeding or bruising -yellowing of the eyes or skin Side effects that usually do not require   medical attention (report to your doctor or health care professional if they continue or are bothersome): -cough -dizziness -drowsiness -headache -nightmares -stomach upset -stuffy nose This  list may not describe all possible side effects. Call your doctor for medical advice about side effects. You may report side effects to FDA at 1-800-FDA-1088. Where should I keep my medicine? Keep out of the reach of children. Store at room temperature between 15 and 30 degrees C (59 and 86 degrees F). Protect from light and moisture. Keep this medicine in the original bottle. Throw away any unused medicine after the expiration date. NOTE: This sheet is a summary. It may not cover all possible information. If you have questions about this medicine, talk to your doctor, pharmacist, or health care provider.  2018 Elsevier/Gold Standard (2015-08-28 09:40:44)  

## 2017-09-08 NOTE — Progress Notes (Signed)
Teresa Carrillo  Patient Care Team: Roselee Nova, [name]  CHIEF COMPLAINTS/PURPOSE OF CONSULTATION:   Oncology History   #JUNE 2017- Severe neutropenia/ Anemia ~hb 9/platlets- 85-100 AUG 2017- REFRACTORY ANEMIA with EXCESS BLASTS [14% blasts- BMBx]; cytogenetics/FISH-N [SNP micorarray- not done]   # AUG 21st 2017-  START AZA 63m/m2 day- 1-7 q 28 days x4 cycles; DEC 6th- BMBx- <5% blasts; hypercellular with dysplasia.   # SEP 20th 2018- ACUTE MYELOID LEUKEMIA [38% blasts on BMBx]; IDH-MUTATION PRESENT; low FLT-3; OCT 18th 2018- ENASIDENIB [Dr.Foster; [facility]]   # CKD stage III  ------------------------------------------------------   MOLECULAR TESTING: NGS sent 06/2017 -FLT3-ITD <0.05 mutational burden -IDH2 -DNMT3A --treatment options include FLT3 inhibition and IDH2 inhibition; given low level FOHY0-VPXallelic ratio [admittedly in peripheral blood, not bone marrow]- started on ENASIDENIB     MDS (myelodysplastic syndrome), high grade ([place]   04/23/2016 Initial Diagnosis    MDS (myelodysplastic syndrome), high grade (HCC)       Acute myeloid leukemia in relapse ([place]     HISTORY OF PRESENTING ILLNESS:  Teresa 89.o.  female with above history of transformed acute myeloid leukemia- from MDS, [facility]   In the interim, she has completed levaquin (08/25/17) for pneumonia diagnosed on CT. Had some improvement in symptoms but not resolved. Continues to have sob and cough today.   Continues Enasidenib. No nausea, vomiting, or diarrhea. Continues to have mild fatigue which is stable. Denies fever and/or chills. Denies episodes of bleeding. Denies significant changes to weight.    ROS: A complete 10 point review of system is done which is negative except mentioned above in history of present illness.  MEDICAL HISTORY:  Past Medical History:  Diagnosis Date  .  Abdominal wall mass   . Anginal pain ([place]   . Anxiety   . Arthritis   . Calculus of kidney   . Cystitis   . Depression   . Diabetes mellitus without complication (HCC)    elevated A1c  . Dyspnea on exertion   . Elevated serum creatinine   . Fibrocystic breast disease   . GERD (gastroesophageal reflux disease)   . Hearing loss   . Heart murmur   . HTN (hypertension)   . Hyperlipidemia   . MDS (myelodysplastic syndrome), high grade (HFris[place] 04/23/2016  . Microscopic hematuria   . Mouth sores   . Mucositis due to chemotherapy   . Obesity   . Risk for falls   . Sleep apnea   . Thrombocytopenia (HAl[place]   . Urinary frequency   . Urinary urgency     SURGICAL HISTORY: Past Surgical History:  Procedure Laterality Date  . ABDOMINAL HYSTERECTOMY    . APPENDECTOMY    . CARDIAC CATHETERIZATION     x2  . CHOLECYSTECTOMY    . COLONOSCOPY N/A 02/24/2015   Procedure: COLONOSCOPY;  Surgeon: RManya Silvas MD;  [facility];  Service: Endoscopy;  Laterality: N/A;  . DIAGNOSTIC LAPAROSCOPY     Removal of benign abdominal tumor  . ESOPHAGOGASTRODUODENOSCOPY N/A 02/24/2015   Procedure: ESOPHAGOGASTRODUODENOSCOPY (EGD);  Surgeon: RManya Silvas MD;  [facility];  Service: Endoscopy;  Laterality: N/A;  . right eye surgery Right     SOCIAL HISTORY: [place] No smoking/ no alcohol.  Social History   Socioeconomic History  . Marital status: Married    Spouse  name: Not on file  . Number of children: Not on file  . Years of education: Not on file  . Highest education level: Not on file  Social Needs  . Financial resource strain: Not on file  . Food insecurity - worry: Not on file  . Food insecurity - inability: Not on file  . Transportation needs - medical: Not on file  . Transportation needs - non-medical: Not on file  Occupational History  . Not on file  Tobacco Use  . Smoking status: Former Smoker    Types:  Cigarettes    Last attempt to quit: 09/09/1988    Years since quitting: 29.0  . Smokeless tobacco: Never Used  . Tobacco comment: quit 25 years ago  Substance and Sexual Activity  . Alcohol use: No    Alcohol/week: 0.0 oz  . Drug use: No  . Sexual activity: Not Currently    Birth control/protection: Post-menopausal  Other Topics Concern  . Not on file  Social History Narrative  . Not on file    FAMILY HISTORY: no cancers in family.  Family History  Problem Relation Age of Onset  . Congestive Heart Failure Mother   . Diabetes Mother   . Coronary artery disease Mother   . Stroke Mother   . Cirrhosis Father     ALLERGIES:  is allergic to macrobid [nitrofurantoin monohyd macro].  MEDICATIONS:  Current Outpatient Medications  Medication Sig Dispense Refill  . albuterol (PROVENTIL HFA;VENTOLIN HFA) 108 (90 Base) MCG/ACT inhaler Inhale 2 puffs into the lungs every 6 (six) hours as needed for wheezing or shortness of breath. 1 Inhaler 2  . benzonatate (TESSALON) 100 MG capsule Take 1 capsule (100 mg total) by mouth 3 (three) times daily as needed for cough. (Patient not taking: Reported on 09/15/2017) 30 capsule 0  . buPROPion (WELLBUTRIN XL) 150 MG 24 hr tablet Take 300 mg by mouth daily at 12 noon.     . clonazePAM (KLONOPIN) 0.5 MG tablet Take 0.5 mg by mouth 2 (two) times daily.     Marland Kitchen diltiazem (CARDIZEM CD) 180 MG 24 hr capsule Take 1 capsule (180 mg total) by mouth daily. 30 capsule 0  . ondansetron (ZOFRAN-ODT) 4 MG disintegrating tablet Take 4 mg by mouth every 8 (eight) hours as needed for nausea or vomiting.    . rosuvastatin (CRESTOR) 20 MG tablet TAKE 1 TABLET(20 MG) BY MOUTH AT BEDTIME 90 tablet 0  . sertraline (ZOLOFT) 100 MG tablet Take 100 mg by mouth daily at 12 noon.     . hydrocortisone (ANUSOL-HC) 2.5 % rectal cream Place 1 application rectally 2 (two) times daily as needed for hemorrhoids or itching. (Patient not taking: Reported on 09/08/2017) 30 g 0  . ipratropium  (ATROVENT HFA) 17 MCG/ACT inhaler Inhale 2 puffs into the lungs every 4 (four) hours as needed for wheezing. (Patient not taking: Reported on 09/08/2017) 1 Inhaler 12  . magic mouthwash w/lidocaine SOLN Take 5 mLs by mouth 4 (four) times daily. 80 ml viscous lidocaine 2%, 80 ml Mylanta, 80 ml Diphenhydramine 12.5 mg/5 ml Elixir, 80 ml Nystatin 100,000 Unit suspension, 80 ml Prednisolone 15 mg/54m, 80 ml Distilled Water.  Sig: Swish/Swallow 5-10 ml four times a day as needed. Dispense 480 ml. 3RFs (Patient not taking: Reported on 09/08/2017) 480 mL 3  . montelukast (SINGULAIR) 10 MG tablet Take 1 tablet (10 mg total) by mouth at bedtime. 30 tablet 2  . prochlorperazine (COMPAZINE) 10 MG tablet Take 1 tablet (10  mg total) by mouth every 6 (six) hours as needed (Nausea or vomiting). (Patient not taking: Reported on 09/08/2017) 30 tablet 1   No current facility-administered medications for this visit.       PHYSICAL EXAMINATION: ECOG PERFORMANCE STATUS: 1 - Symptomatic but completely ambulatory  Vitals:   09/08/17 0930  BP: 134/81  Pulse: 80  Resp: 20  Temp: 97.8 F (36.6 C)  SpO2: 94%    GENERAL: Well-nourished well-developed; Alert, no distress and comfortable. Obese. Accompanied by her husband.  EYES: Positive for pallor. HEENT: altered voice. no thrush or ulceration. Clear, moist. R turbinate swollen NECK: supple, no masses felt LYMPH:  no palpable lymphadenopathy in the cervical, axillary or inguinal regions LUNGS: No wheezes or crackles. Lungs clear.  HEART/CVS: regular rate & rhythm and no murmurs; No lower extremity edema ABDOMEN: abdomen soft, non-tender and normal bowel sounds; possible splenomegaly. Musculoskeletal:no cyanosis of digits and no clubbing. Ambulates w/o aids PSYCH: alert & oriented x 3 with fluent speech NEURO: no focal motor/sensory deficits SKIN:  No skin rash/nodules.   LABORATORY DATA:  I have reviewed the data as listed Lab Results  Component Value  Date   WBC 5.7 09/08/2017   HGB 8.1 (L) 09/08/2017   HCT 24.4 (L) 09/08/2017   MCV 103.9 (H) 09/08/2017   PLT 23 (LL) 09/08/2017   Recent Labs    07/17/17 1229  08/07/17 0919 08/11/17 0948 08/13/17 0838 08/25/17 0938  NA 137   < > 140 141 142 140  K 3.6   < > 4.1 4.0 4.3 4.3  CL 103   < > 105 104 108 105  CO2 24   < > _0 GLUCOSE 114*   < > 131* 132* 142* 136*  BUN 16   < > _1 CREATININE 1.37*   < > 1.27* 1.23* 1.03* 1.28*  CALCIUM 9.2   < > 9.2 9.1 8.9 9.0  GFRNONAA 37*   < > 40* 42* 51* 40*  GFRAA 43*   < > 46* 48* 60* 46*  PROT 8.0   < > 7.5 7.4  --  6.9  ALBUMIN 3.6   < > 3.8 3.8  --  3.8  AST 29   < > 19 20  --  24  ALT 19   < > 16 15  --  27  ALKPHOS 77   < > 81 68  --  62  BILITOT 1.2   < > 1.2 1.1  --  1.1  BILIDIR 0.1  --   --   --   --   --   IBILI 1.1*  --   --   --   --   --    < > = values in this interval not displayed.    RADIOGRAPHIC STUDIES: I have personally reviewed the radiological images as listed and agreed with the findings in the report. Ct Chest Wo Contrast  Result Date: 08/25/2017 CLINICAL DATA:  Wheezing and shortness of breath for 5-6 weeks, AML. EXAM: CT CHEST WITHOUT CONTRAST TECHNIQUE: Multidetector CT imaging of the chest was performed following the standard protocol without IV contrast. COMPARISON:  CT abdomen pelvis 07/17/2017 and CT chest 01/23/2015. FINDINGS: Cardiovascular: Atherosclerotic calcification of the arterial vasculature, including moderate involvement of the coronary arteries. Heart size normal. No pericardial effusion. Mediastinum/Nodes: There are numerous mediastinal lymph nodes, none of which meet CT size criteria for pathologic enlargement. Hilar regions are difficult to evaluate without  IV contrast. No axillary adenopathy. Calcified left axillary lymph nodes. Esophagus is grossly unremarkable. Lungs/Pleura: Image quality is degraded by respiratory motion. Mid and lower lung zone predominant  peribronchovascular ground-glass, nodularity and consolidation. No pleural fluid. Airway is unremarkable. Upper Abdomen: Visualized portions of the liver, adrenal glands, spleen, pancreas and stomach are grossly unremarkable. There are calcified upper abdominal lymph nodes. Lymph node at the diaphragmatic hiatus measures 1.4 cm and in the gastrohepatic ligament, 1.3 cm, similar to 07/17/2017. Musculoskeletal: Degenerative changes in the spine. IMPRESSION: 1. Mid and lower lung zone peribronchovascular ground-glass, nodularity and consolidation, likely due to an infectious bronchiolitis/bronchopneumonia. 2. Upper abdominal adenopathy, as on 07/17/2017. 3. Aortic atherosclerosis (ICD10-170.0). Moderate coronary artery calcification. Electronically Signed   By: Lorin Picket M.D.   On: 08/25/2017 11:57   IMPRESSION: 1. Mid and lower lung zone peribronchovascular ground-glass, nodularity and consolidation, likely due to an infectious bronchiolitis/bronchopneumonia. 2. Upper abdominal adenopathy, as on 07/17/2017. 3. Aortic atherosclerosis (ICD10-170.0). Moderate coronary artery calcification.  Electronically Signed   By: Lorin Picket M.D.   On: 08/25/2017 11:57  ASSESSMENT & PLAN:   AML- BMB 05/2017 shows 38% blasts/AML, also on peripheral blood. IDH positive, currently on Edasidenib 126m/daily. [facility]   Severe anemia/thrombocytopenia- secondary to AML. Hemoglobin 8.1 today, platelets 23. No acute bleeding episodes. No transfusions today.   COPD- mild, no acute exacerbation. Encouraged patient to follow-up with pulmonology as scheduled. Will add Singulair today.   LBeckey Rutter DNP, AGNP-C CNorwayat [phone](office) 09/15/17 4:12 PM

## 2017-09-10 NOTE — Unmapped (Signed)
I called [name] about her IDHIFA refill and she still has #22.  She was in the hospital which is the reason for this.  I will call her back 09/22/17 to schedule the next refill.

## 2017-09-11 ENCOUNTER — Inpatient Hospital Stay: Payer: Medicare Other | Attending: Internal Medicine

## 2017-09-11 ENCOUNTER — Inpatient Hospital Stay: Payer: Medicare Other

## 2017-09-11 ENCOUNTER — Telehealth: Payer: Self-pay | Admitting: *Deleted

## 2017-09-11 DIAGNOSIS — D6959 Other secondary thrombocytopenia: Secondary | ICD-10-CM | POA: Diagnosis not present

## 2017-09-11 DIAGNOSIS — Z87891 Personal history of nicotine dependence: Secondary | ICD-10-CM | POA: Insufficient documentation

## 2017-09-11 DIAGNOSIS — D4620 Refractory anemia with excess of blasts, unspecified: Secondary | ICD-10-CM | POA: Insufficient documentation

## 2017-09-11 DIAGNOSIS — D630 Anemia in neoplastic disease: Secondary | ICD-10-CM | POA: Insufficient documentation

## 2017-09-11 DIAGNOSIS — N183 Chronic kidney disease, stage 3 (moderate): Secondary | ICD-10-CM | POA: Insufficient documentation

## 2017-09-11 DIAGNOSIS — I129 Hypertensive chronic kidney disease with stage 1 through stage 4 chronic kidney disease, or unspecified chronic kidney disease: Secondary | ICD-10-CM | POA: Insufficient documentation

## 2017-09-11 DIAGNOSIS — C9202 Acute myeloblastic leukemia, in relapse: Secondary | ICD-10-CM | POA: Diagnosis not present

## 2017-09-11 DIAGNOSIS — J449 Chronic obstructive pulmonary disease, unspecified: Secondary | ICD-10-CM | POA: Diagnosis not present

## 2017-09-11 LAB — CBC WITH DIFFERENTIAL/PLATELET
BASOS ABS: 0.1 10*3/uL (ref 0–0.1)
BASOS PCT: 1 %
EOS ABS: 0 10*3/uL (ref 0–0.7)
Eosinophils Relative: 0 %
HCT: 25.3 % — ABNORMAL LOW (ref 35.0–47.0)
Hemoglobin: 8.5 g/dL — ABNORMAL LOW (ref 12.0–16.0)
Lymphocytes Relative: 26 %
Lymphs Abs: 1.3 10*3/uL (ref 1.0–3.6)
MCH: 35.1 pg — ABNORMAL HIGH (ref 26.0–34.0)
MCHC: 33.6 g/dL (ref 32.0–36.0)
MCV: 104.5 fL — ABNORMAL HIGH (ref 80.0–100.0)
MONO ABS: 0.3 10*3/uL (ref 0.2–0.9)
MONOS PCT: 6 %
NEUTROS ABS: 3.3 10*3/uL (ref 1.4–6.5)
NEUTROS PCT: 67 %
Platelets: 18 10*3/uL — CL (ref 150–400)
RBC: 2.42 MIL/uL — ABNORMAL LOW (ref 3.80–5.20)
RDW: 25.9 % — AB (ref 11.5–14.5)
WBC: 5 10*3/uL (ref 3.6–11.0)

## 2017-09-11 LAB — SAMPLE TO BLOOD BANK

## 2017-09-11 NOTE — Telephone Encounter (Signed)
Critical lab value called by Marc Morgans, [facility]- plt count 18; read back process performed with lab tech-[id number] Dr. Rogue Bussing - informed of critical value 0900-read back process performed with md-. No iv blood or plts today per md.

## 2017-09-11 NOTE — Progress Notes (Unsigned)
Informed provider of labs results, per Dr B no transfusion today. Pt informed and given copy of results per request.

## 2017-09-15 ENCOUNTER — Inpatient Hospital Stay: Payer: Medicare Other

## 2017-09-15 ENCOUNTER — Encounter: Payer: Self-pay | Admitting: Internal Medicine

## 2017-09-15 ENCOUNTER — Encounter: Payer: Self-pay | Admitting: Nurse Practitioner

## 2017-09-15 ENCOUNTER — Inpatient Hospital Stay (HOSPITAL_BASED_OUTPATIENT_CLINIC_OR_DEPARTMENT_OTHER): Payer: Medicare Other | Admitting: Internal Medicine

## 2017-09-15 ENCOUNTER — Other Ambulatory Visit: Payer: Self-pay

## 2017-09-15 VITALS — BP 145/74 | HR 72 | Temp 97.9°F | Resp 22 | Wt 224.4 lb

## 2017-09-15 DIAGNOSIS — D4620 Refractory anemia with excess of blasts, unspecified: Secondary | ICD-10-CM | POA: Diagnosis not present

## 2017-09-15 DIAGNOSIS — D630 Anemia in neoplastic disease: Secondary | ICD-10-CM | POA: Diagnosis not present

## 2017-09-15 DIAGNOSIS — D4622 Refractory anemia with excess of blasts 2: Secondary | ICD-10-CM | POA: Diagnosis not present

## 2017-09-15 DIAGNOSIS — I129 Hypertensive chronic kidney disease with stage 1 through stage 4 chronic kidney disease, or unspecified chronic kidney disease: Secondary | ICD-10-CM

## 2017-09-15 DIAGNOSIS — C9202 Acute myeloblastic leukemia, in relapse: Secondary | ICD-10-CM

## 2017-09-15 DIAGNOSIS — D631 Anemia in chronic kidney disease: Secondary | ICD-10-CM | POA: Diagnosis not present

## 2017-09-15 DIAGNOSIS — D6959 Other secondary thrombocytopenia: Secondary | ICD-10-CM | POA: Diagnosis not present

## 2017-09-15 DIAGNOSIS — N183 Chronic kidney disease, stage 3 (moderate): Secondary | ICD-10-CM | POA: Diagnosis not present

## 2017-09-15 DIAGNOSIS — Z87891 Personal history of nicotine dependence: Secondary | ICD-10-CM | POA: Diagnosis not present

## 2017-09-15 LAB — CBC WITH DIFFERENTIAL/PLATELET
BASOS ABS: 0.1 10*3/uL (ref 0–0.1)
Basophils Relative: 1 %
Eosinophils Absolute: 0 10*3/uL (ref 0–0.7)
Eosinophils Relative: 0 %
HEMATOCRIT: 23.2 % — AB (ref 35.0–47.0)
HEMOGLOBIN: 7.8 g/dL — AB (ref 12.0–16.0)
LYMPHS PCT: 29 %
Lymphs Abs: 1.1 10*3/uL (ref 1.0–3.6)
MCH: 35.1 pg — ABNORMAL HIGH (ref 26.0–34.0)
MCHC: 33.4 g/dL (ref 32.0–36.0)
MCV: 105.1 fL — ABNORMAL HIGH (ref 80.0–100.0)
Monocytes Absolute: 0.2 10*3/uL (ref 0.2–0.9)
Monocytes Relative: 6 %
Neutro Abs: 2.3 10*3/uL (ref 1.4–6.5)
Neutrophils Relative %: 64 %
Platelets: 18 10*3/uL — CL (ref 150–400)
RBC: 2.21 MIL/uL — AB (ref 3.80–5.20)
RDW: 24.5 % — ABNORMAL HIGH (ref 11.5–14.5)
WBC: 3.6 10*3/uL (ref 3.6–11.0)

## 2017-09-15 LAB — SAMPLE TO BLOOD BANK

## 2017-09-15 NOTE — Assessment & Plan Note (Addendum)
Bone marrow biopsy September 2018 shows- ACUTE MYELOID LEUKEMIA [38% blasts]; and also the peripheral blood. IDH- positive; currently on Enasidenib 184m/day [started on 10/19; [facility]; Dr.Foster]. Repeat BMBx- 11/12 [[facility]]- increase in blasts [35% to >60%; per report].    #Severe anemia/thrombocytopenia-secondary to # AML; hemoglobin 8.3; platelets 18-hold transfusions today.  # ?  [facility]  For now continue Enasidenib.  [facility]  # BIL LE pain/achy; x 3days- no swelling- ? Bone pain.  Question malignancy versus others.  Recommend Tylenol as needed.  #CKD stage III-stable.  # Prophylactic antibiotics- acyclovir BID;  # hold tube/platelets- on 01/11;   # cbc/hold tube twice weekly; follow up in 3 weeks/MD.

## 2017-09-15 NOTE — Progress Notes (Signed)
[place] NOTE  Patient Care Team: Roselee Nova, [name]  CHIEF COMPLAINTS/PURPOSE OF CONSULTATION:   Oncology History   #JUNE 2017- Severe neutropenia/ Anemia ~hb 9/platlets- 85-100 AUG 2017- REFRACTORY ANEMIA with EXCESS BLASTS [14% blasts- BMBx]; cytogenetics/FISH-N [SNP micorarray- not done]   # AUG 21st 2017-  START AZA '75mg'$ /m2 day- 1-7 q 28 days x4 cycles; DEC 6th- BMBx- <5% blasts; hypercellular with dysplasia.   # SEP 20th 2018- ACUTE MYELOID LEUKEMIA [38% blasts on BMBx]; IDH-MUTATION PRESENT; low FLT-3; OCT 18th 2018- ENASIDENIB [Dr.Foster; [facility]]   # CKD stage III  ------------------------------------------------------   MOLECULAR TESTING: NGS sent 06/2017 -FLT3-ITD <0.05 mutational burden -IDH2 -DNMT3A --treatment options include FLT3 inhibition and IDH2 inhibition; given low level YSA6-TKZ allelic ratio [admittedly in peripheral blood, not bone marrow]- started on ENASIDENIB     MDS (myelodysplastic syndrome), high grade ([place])   04/23/2016 Initial Diagnosis    MDS (myelodysplastic syndrome), high grade (HCC)       Acute myeloid leukemia in relapse (HCC)  At    HISTORY OF PRESENTING ILLNESS:  Teresa Carrillo 77 y.o.  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  Patient is currently status post Levaquin prednisone for bronchitis/bronchopneumonia.  Her symptoms are resolved.  She complains of mild to moderate fatigue.  However not significantly worse.  No nosebleeds or gum bleeding. No nausea no vomiting or diarrhea.  Chronic mild fatigue.  Denies any fevers or chills. Denies any gum bleeding.  Denies any significant weight gain or weight loss.  Patient however complains of bilateral bone pain/ shins.  Denies any trauma.  Denies any lumps.  Denies any calf pain.  ROS: A complete 10 point review of system is done which is  negative except mentioned above in history of present illness.  MEDICAL HISTORY:  Past Medical History:  Diagnosis Date  . Abdominal wall mass   . Anginal pain ([place])   . Anxiety   . Arthritis   . Calculus of kidney   . Cystitis   . Depression   . Diabetes mellitus without complication (HCC)    elevated A1c  . Dyspnea on exertion   . Elevated serum creatinine   . Fibrocystic breast disease   . GERD (gastroesophageal reflux disease)   . Hearing loss   . Heart murmur   . HTN (hypertension)   . Hyperlipidemia   . MDS (myelodysplastic syndrome), high grade ([place]) 04/23/2016  . Microscopic hematuria   . Mouth sores   . Mucositis due to chemotherapy   . Obesity   . Risk for falls   . Sleep apnea   . Thrombocytopenia ([place])   . Urinary frequency   . Urinary urgency     SURGICAL HISTORY: Past Surgical History:  Procedure Laterality Date  . ABDOMINAL HYSTERECTOMY    . APPENDECTOMY    . CARDIAC CATHETERIZATION     x2  . CHOLECYSTECTOMY    . COLONOSCOPY N/A 02/24/2015   Procedure: COLONOSCOPY;  Surgeon: Manya Silvas, MD;  Location: [facility] ENDOSCOPY;  Service: Endoscopy;  Laterality: N/A;  . DIAGNOSTIC LAPAROSCOPY     Removal of benign abdominal tumor  . ESOPHAGOGASTRODUODENOSCOPY N/A 02/24/2015   Procedure: ESOPHAGOGASTRODUODENOSCOPY (EGD);  Surgeon: Manya Silvas, MD;  Location: [facility] ENDOSCOPY;  Service: Endoscopy;  Laterality: N/A;  . right eye surgery Right     SOCIAL HISTORY: lives with family;  [employer]; [employer] in [place] ret[place]. No smoking/ no alcohol.  Social History   Socioeconomic History  . Marital status: Married    Spouse name: Not on file  . Number of children: Not on file  . Years of education: Not on file  . Highest education level: Not on file  Social Needs  . Financial resource strain: Not on file  . Food insecurity - worry: Not on file  . Food insecurity - inability: Not on file  . Transportation needs - medical: Not on file  . Transportation  needs - non-medical: Not on file  Occupational History  . Not on file  Tobacco Use  . Smoking status: Former Smoker    Types: Cigarettes    Last attempt to quit: 09/09/1988    Years since quitting: 29.0  . Smokeless tobacco: Never Used  . Tobacco comment: quit 25 years ago  Substance and Sexual Activity  . Alcohol use: No    Alcohol/week: 0.0 oz  . Drug use: No  . Sexual activity: Not Currently    Birth control/protection: Post-menopausal  Other Topics Concern  . Not on file  Social History Narrative  . Not on file    FAMILY HISTORY: no cancers in family.  Family History  Problem Relation Age of Onset  . Congestive Heart Failure Mother   . Diabetes Mother   . Coronary artery disease Mother   . Stroke Mother   . Cirrhosis Father     ALLERGIES:  is allergic to macrobid [nitrofurantoin monohyd macro].  MEDICATIONS:  Current Outpatient Medications  Medication Sig Dispense Refill  . albuterol (PROVENTIL HFA;VENTOLIN HFA) 108 (90 Base) MCG/ACT inhaler Inhale 2 puffs into the lungs every 6 (six) hours as needed for wheezing or shortness of breath. 1 Inhaler 2  . buPROPion (WELLBUTRIN XL) 150 MG 24 hr tablet Take 300 mg by mouth daily at 12 noon.     . clonazePAM (KLONOPIN) 0.5 MG tablet Take 0.5 mg by mouth 2 (two) times daily.     Marland Kitchen diltiazem (CARDIZEM CD) 180 MG 24 hr capsule Take 1 capsule (180 mg total) by mouth daily. 30 capsule 0  . montelukast (SINGULAIR) 10 MG tablet Take 1 tablet (10 mg total) by mouth at bedtime. 30 tablet 2  . rosuvastatin (CRESTOR) 20 MG tablet TAKE 1 TABLET(20 MG) BY MOUTH AT BEDTIME 90 tablet 0  . sertraline (ZOLOFT) 100 MG tablet Take 100 mg by mouth daily at 12 noon.     . benzonatate (TESSALON) 100 MG capsule Take 1 capsule (100 mg total) by mouth 3 (three) times daily as needed for cough. (Patient not taking: Reported on 09/15/2017) 30 capsule 0  . hydrocortisone (ANUSOL-HC) 2.5 % rectal cream Place 1 application rectally 2 (two) times daily as  needed for hemorrhoids or itching. 30 g 0  . ipratropium (ATROVENT HFA) 17 MCG/ACT inhaler Inhale 2 puffs into the lungs every 4 (four) hours as needed for wheezing. 1 Inhaler 12  . magic mouthwash w/lidocaine SOLN Take 5 mLs by mouth 4 (four) times daily. 80 ml viscous lidocaine 2%, 80 ml Mylanta, 80 ml Diphenhydramine 12.5 mg/5 ml Elixir, 80 ml Nystatin 100,000 Unit suspension, 80 ml Prednisolone 15 mg/36m, 80 ml Distilled Water.  Sig: Swish/Swallow 5-10 ml four times a day as needed. Dispense 480 ml. 3RFs 480 mL 3  . ondansetron (ZOFRAN-ODT) 4 MG disintegrating tablet Take 4 mg by mouth every 8 (eight) hours as needed for nausea or vomiting.    . prochlorperazine (COMPAZINE)  10 MG tablet Take 1 tablet (10 mg total) by mouth every 6 (six) hours as needed (Nausea or vomiting). 30 tablet 1   No current facility-administered medications for this visit.       Marland Kitchen  PHYSICAL EXAMINATION: ECOG PERFORMANCE STATUS: 1 - Symptomatic but completely ambulatory  Vitals:   09/15/17 0930  BP: (!) 145/74  Pulse: 72  Resp: (!) 22  Temp: 97.9 F (36.6 C)   Filed Weights   09/15/17 0936  Weight: 224 lb 6.4 oz (101.8 kg)    GENERAL: Well-nourished well-developed; Alert, no distress and comfortable.   Obese. Accompanied by her husband. She is walking by herself. EYES: Positive for pallor. OROPHARYNX: no thrush or ulceration;  NECK: supple, no masses felt LYMPH:  no palpable lymphadenopathy in the cervical, axillary or inguinal regions LUNGS: Decreased breath sounds to auscultation and  No wheeze or crackles HEART/CVS: regular rate & rhythm and no murmurs; No lower extremity edema ABDOMEN: abdomen soft, non-tender and normal bowel sounds; possible splenomegaly. Musculoskeletal:no cyanosis of digits and no clubbing  PSYCH: alert & oriented x 3 with fluent speech NEURO: no focal motor/sensory deficits SKIN:  No skin rash/nodules.  LABORATORY DATA:  I have reviewed the data as listed Lab Results   Component Value Date   WBC 3.6 09/15/2017   HGB 7.8 (L) 09/15/2017   HCT 23.2 (L) 09/15/2017   MCV 105.1 (H) 09/15/2017   PLT 18 (LL) 09/15/2017   Recent Labs    07/17/17 1229  08/07/17 0919 08/11/17 0948 08/13/17 0838 08/25/17 0938  NA 137   < > 140 141 142 140  K 3.6   < > 4.1 4.0 4.3 4.3  CL 103   < > 105 104 108 105  CO2 24   < > '24 24 25 26  '$ GLUCOSE 114*   < > 131* 132* 142* 136*  BUN 16   < > '20 16 13 17  '$ CREATININE 1.37*   < > 1.27* 1.23* 1.03* 1.28*  CALCIUM 9.2   < > 9.2 9.1 8.9 9.0  GFRNONAA 37*   < > 40* 42* 51* 40*  GFRAA 43*   < > 46* 48* 60* 46*  PROT 8.0   < > 7.5 7.4  --  6.9  ALBUMIN 3.6   < > 3.8 3.8  --  3.8  AST 29   < > 19 20  --  24  ALT 19   < > 16 15  --  27  ALKPHOS 77   < > 81 68  --  62  BILITOT 1.2   < > 1.2 1.1  --  1.1  BILIDIR 0.1  --   --   --   --   --   IBILI 1.1*  --   --   --   --   --    < > = values in this interval not displayed.    RADIOGRAPHIC STUDIES: I have personally reviewed the radiological images as listed and agreed with the findings in the report. Ct Chest Wo Contrast  Result Date: 08/25/2017 CLINICAL DATA:  Wheezing and shortness of breath for 5-6 weeks, AML. EXAM: CT CHEST WITHOUT CONTRAST TECHNIQUE: Multidetector CT imaging of the chest was performed following the standard protocol without IV contrast. COMPARISON:  CT abdomen pelvis 07/17/2017 and CT chest 01/23/2015. FINDINGS: Cardiovascular: Atherosclerotic calcification of the arterial vasculature, including moderate involvement of the coronary arteries. Heart size normal. No pericardial effusion. Mediastinum/Nodes: There are numerous mediastinal  lymph nodes, none of which meet CT size criteria for pathologic enlargement. Hilar regions are difficult to evaluate without IV contrast. No axillary adenopathy. Calcified left axillary lymph nodes. Esophagus is grossly unremarkable. Lungs/Pleura: Image quality is degraded by respiratory motion. Mid and lower lung zone  predominant peribronchovascular ground-glass, nodularity and consolidation. No pleural fluid. Airway is unremarkable. Upper Abdomen: Visualized portions of the liver, adrenal glands, spleen, pancreas and stomach are grossly unremarkable. There are calcified upper abdominal lymph nodes. Lymph node at the diaphragmatic hiatus measures 1.4 cm and in the gastrohepatic ligament, 1.3 cm, similar to 07/17/2017. Musculoskeletal: Degenerative changes in the spine. IMPRESSION: 1. Mid and lower lung zone peribronchovascular ground-glass, nodularity and consolidation, likely due to an infectious bronchiolitis/bronchopneumonia. 2. Upper abdominal adenopathy, as on 07/17/2017. 3. Aortic atherosclerosis (ICD10-170.0). Moderate coronary artery calcification. Electronically Signed   By: Lorin Picket M.D.   On: 08/25/2017 11:57   IMPRESSION: 1. Mid and lower lung zone peribronchovascular ground-glass, nodularity and consolidation, likely due to an infectious bronchiolitis/bronchopneumonia. 2. Upper abdominal adenopathy, as on 07/17/2017. 3. Aortic atherosclerosis (ICD10-170.0). Moderate coronary artery calcification.   Electronically Signed   By: Lorin Picket M.D.   On: 08/25/2017 11:57 ASSESSMENT & PLAN:   Acute myeloid leukemia in relapse ([place]) Bone marrow biopsy September 2018 shows- ACUTE MYELOID LEUKEMIA [38% blasts]; and also the peripheral blood. IDH- positive; currently on Enasidenib '100mg'$ /day [started on 10/19; [facility]; Dr.Foster]. Repeat BMBx- 11/12 [[facility]]- increase in blasts [35% to >60%; per report].    #Severe anemia/thrombocytopenia-secondary to # AML; hemoglobin 8.3; platelets 18-hold transfusions today.  # ?  Response on Enadesenib with improvement of blasts on peripheral smear at [facility].  For now continue Enasidenib.  Appointment at [facility] in 3 days from now.  # BIL LE pain/achy; x 3days- no swelling- ? Bone pain.  Question malignancy versus others.  Recommend Tylenol as needed.  #CKD stage  III-stable.  # Prophylactic antibiotics- acyclovir BID;  # hold tube/platelets- on 01/11;   # cbc/hold tube twice weekly; follow up in 3 weeks/MD.      Cammie Sickle, MD 09/16/2017 1:12 PM

## 2017-09-15 NOTE — Progress Notes (Signed)
927 am - Critical plt count of 16 called to [name], RN by Marc Morgans in cancer ctr lab- read back process completed with MD and lab tech.

## 2017-09-18 ENCOUNTER — Other Ambulatory Visit: Payer: Self-pay | Admitting: *Deleted

## 2017-09-18 ENCOUNTER — Encounter: Admit: 2017-09-18 | Discharge: 2017-09-18 | Payer: MEDICARE | Attending: Oncology | Primary: Oncology

## 2017-09-18 ENCOUNTER — Encounter: Admit: 2017-09-18 | Discharge: 2017-09-18 | Payer: MEDICARE

## 2017-09-18 DIAGNOSIS — C9200 Acute myeloblastic leukemia, not having achieved remission: Secondary | ICD-10-CM

## 2017-09-18 DIAGNOSIS — R17 Unspecified jaundice: Principal | ICD-10-CM

## 2017-09-18 DIAGNOSIS — R7989 Other specified abnormal findings of blood chemistry: Principal | ICD-10-CM

## 2017-09-18 DIAGNOSIS — D46Z Other myelodysplastic syndromes: Secondary | ICD-10-CM

## 2017-09-18 LAB — CBC W/ AUTO DIFF
BASOPHILS ABSOLUTE COUNT: 0 10*9/L (ref 0.0–0.1)
EOSINOPHILS ABSOLUTE COUNT: 0 10*9/L (ref 0.0–0.4)
HEMATOCRIT: 24.6 % — ABNORMAL LOW (ref 36.0–46.0)
HEMOGLOBIN: 8.3 g/dL — ABNORMAL LOW (ref 12.0–16.0)
LYMPHOCYTES ABSOLUTE COUNT: 0.8 10*9/L — ABNORMAL LOW (ref 1.5–5.0)
MEAN CORPUSCULAR HEMOGLOBIN CONC: 33.9 g/dL (ref 31.0–37.0)
MEAN CORPUSCULAR HEMOGLOBIN: 35.1 pg — ABNORMAL HIGH (ref 26.0–34.0)
MEAN CORPUSCULAR VOLUME: 103.6 fL — ABNORMAL HIGH (ref 80.0–100.0)
MEAN PLATELET VOLUME: 10.8 fL — ABNORMAL HIGH (ref 7.0–10.0)
MONOCYTES ABSOLUTE COUNT: 0.3 10*9/L (ref 0.2–0.8)
NEUTROPHILS ABSOLUTE COUNT: 2.4 10*9/L (ref 2.0–7.5)
PLATELET COUNT: 21 10*9/L — ABNORMAL LOW (ref 150–440)
RED BLOOD CELL COUNT: 2.38 10*12/L — ABNORMAL LOW (ref 4.00–5.20)
RED CELL DISTRIBUTION WIDTH: 23 % — ABNORMAL HIGH (ref 12.0–15.0)
WBC ADJUSTED: 3.4 10*9/L — ABNORMAL LOW (ref 4.5–11.0)

## 2017-09-18 LAB — URINALYSIS
BILIRUBIN UA: NEGATIVE
GLUCOSE UA: NEGATIVE
KETONES UA: NEGATIVE
NITRITE UA: NEGATIVE
PH UA: 5.5 (ref 5.0–9.0)
PROTEIN UA: 30 — AB
RBC UA: 9 /HPF — ABNORMAL HIGH (ref <4)
SPECIFIC GRAVITY UA: 1.009 (ref 1.003–1.030)
SQUAMOUS EPITHELIAL: <1 /HPF (ref 0–5)
UROBILINOGEN UA: 0.2
WBC UA: 6 /HPF — ABNORMAL HIGH (ref 0–5)

## 2017-09-18 LAB — COMPREHENSIVE METABOLIC PANEL
ALBUMIN: 4.3 g/dL (ref 3.5–5.0)
ALKALINE PHOSPHATASE: 60 U/L (ref 38–126)
ANION GAP: 12 mmol/L (ref 9–15)
AST (SGOT): 18 U/L (ref 14–38)
BILIRUBIN TOTAL: 1.3 mg/dL — ABNORMAL HIGH (ref 0.0–1.2)
BLOOD UREA NITROGEN: 24 mg/dL — ABNORMAL HIGH (ref 7–21)
BUN / CREAT RATIO: 13
CALCIUM: 9.2 mg/dL (ref 8.5–10.2)
CHLORIDE: 111 mmol/L — ABNORMAL HIGH (ref 98–107)
CO2: 23 mmol/L (ref 22.0–30.0)
CREATININE: 1.83 mg/dL — ABNORMAL HIGH (ref 0.60–1.00)
EGFR MDRD AF AMER: 33 mL/min/{1.73_m2} — ABNORMAL LOW (ref >=60)
GLUCOSE RANDOM: 102 mg/dL (ref 65–179)
PROTEIN TOTAL: 6.4 g/dL — ABNORMAL LOW (ref 6.5–8.3)
SODIUM: 146 mmol/L — ABNORMAL HIGH (ref 135–145)

## 2017-09-18 LAB — BILIRUBIN TOTAL: Bilirubin:MCnc:Pt:Ser/Plas:Qn:: 1.3 — ABNORMAL HIGH

## 2017-09-18 LAB — CREATININE
Creatinine:MCnc:Pt:Ser/Plas:Qn:: 1.62 — ABNORMAL HIGH
EGFR MDRD NON AF AMER: 31 mL/min/{1.73_m2} — ABNORMAL LOW (ref >=60)

## 2017-09-18 LAB — SMEAR REVIEW

## 2017-09-18 LAB — URIC ACID: Urate:MCnc:Pt:Ser/Plas:Qn:: 6.7 — ABNORMAL HIGH

## 2017-09-18 LAB — BILIRUBIN DIRECT: Bilirubin.glucuronidated:MCnc:Pt:Ser/Plas:Qn:: 0.4

## 2017-09-18 LAB — GLUCOSE UA: Lab: NEGATIVE

## 2017-09-18 LAB — HYPOCHROMIA

## 2017-09-18 NOTE — Unmapped (Signed)
If you feel like this is an emergency please call 911.  For appointments or questions Monday through Friday 8AM-5PM please call [phone] or Toll Free [phone]. For Medical questions or concerns ask for the Nurse Triage Line.  On Nights, Weekends, and Holidays call [phone] and ask for the Oncologist on Call.  Reasons to call the Nurse Triage Line:  Fever of 100.5 or greater  Nausea and/or vomiting not relived with nausea medicine  Diarrhea or constipation  Severe pain not relieved with usual pain regimen  Shortness of breath  Uncontrolled bleeding  Mental status changes      Appointment on 09/18/2017   Component Date Value Ref Range Status   ??? Uric Acid 09/18/2017 6.7* 3.0 - 6.5 mg/dL Final   Office Visit on 09/18/2017   Component Date Value Ref Range Status   ??? Bilirubin, Direct 09/18/2017 0.40  0.00 - 0.40 mg/dL Final   Lab on 27/02/2375   Component Date Value Ref Range Status   ??? Sodium 09/18/2017 146* 135 - 145 mmol/L Final   ??? Potassium 09/18/2017 4.2  3.5 - 5.0 mmol/L Final   ??? Chloride 09/18/2017 111* 98 - 107 mmol/L Final   ??? CO2 09/18/2017 23.0  22.0 - 30.0 mmol/L Final   ??? BUN 09/18/2017 24* 7 - 21 mg/dL Final   ??? Creatinine 09/18/2017 1.83* 0.60 - 1.00 mg/dL Final   ??? BUN/Creatinine Ratio 09/18/2017 13   Final   ??? EGFR MDRD Non Af Amer 09/18/2017 27* >=60 mL/min/1.51m2 Final   ??? EGFR MDRD Af Amer 09/18/2017 33* >=60 mL/min/1.73m2 Final   ??? Anion Gap 09/18/2017 12  9 - 15 mmol/L Final   ??? Glucose 09/18/2017 102  65 - 179 mg/dL Final   ??? Calcium 28/31/5176 9.2  8.5 - 10.2 mg/dL Final   ??? Albumin 16/03/3709 4.3  3.5 - 5.0 g/dL Final   ??? Total Protein 09/18/2017 6.4* 6.5 - 8.3 g/dL Final   ??? Total Bilirubin 09/18/2017 1.3* 0.0 - 1.2 mg/dL Final   ??? AST 62/69/4854 18  14 - 38 U/L Final   ??? ALT 09/18/2017 18  15 - 48 U/L Final   ??? Alkaline Phosphatase 09/18/2017 60  38 - 126 U/L Final   ??? WBC 09/18/2017 3.4* 4.5 - 11.0 10*9/L Final   ??? RBC 09/18/2017 2.38* 4.00 - 5.20 10*12/L Final   ??? HGB 09/18/2017 8.3* 12.0 - 16.0 g/dL Final   ??? HCT 62/70/3500 24.6* 36.0 - 46.0 % Final   ??? MCV 09/18/2017 103.6* 80.0 - 100.0 fL Final   ??? MCH 09/18/2017 35.1* 26.0 - 34.0 pg Final   ??? MCHC 09/18/2017 33.9  31.0 - 37.0 g/dL Final   ??? RDW 93/81/8299 23.0* 12.0 - 15.0 % Final   ??? MPV 09/18/2017 10.8* 7.0 - 10.0 fL Final   ??? Platelet 09/18/2017 21* 150 - 440 10*9/L Final   ??? Variable HGB Concentration 09/18/2017 Moderate* Not Present Final   ??? Absolute Neutrophils 09/18/2017 2.4  2.0 - 7.5 10*9/L Final   ??? Absolute Lymphocytes 09/18/2017 0.8* 1.5 - 5.0 10*9/L Final   ??? Absolute Monocytes 09/18/2017 0.3  0.2 - 0.8 10*9/L Final   ??? Absolute Eosinophils 09/18/2017 0.0  0.0 - 0.4 10*9/L Final   ??? Absolute Basophils 09/18/2017 0.0  0.0 - 0.1 10*9/L Final   ??? Large Unstained Cells 09/18/2017 2  0 - 4 % Final   ??? Microcytosis 09/18/2017 Slight* Not Present Final   ??? Macrocytosis 09/18/2017 Marked* Not Present Final   ???  Anisocytosis 09/18/2017 Marked* Not Present Final   ??? Hyperchromasia 09/18/2017 Slight* Not Present Final   ??? Hypochromasia 09/18/2017 Slight* Not Present Final   ??? Smear Review Comments 09/18/2017 See Comment* Undefined Final    Myelocytes present-rare.

## 2017-09-18 NOTE — Unmapped (Signed)
There are some positive signs that the Idhifa is helping.     You can take this medication indefinitely.    I'd like to give you fluids today and then recheck your kidney function.    We'll see you back in four weeks.    Lab on 09/18/2017   Component Date Value Ref Range Status   ??? Sodium 09/18/2017 146* 135 - 145 mmol/L Final   ??? Potassium 09/18/2017 4.2  3.5 - 5.0 mmol/L Final   ??? Chloride 09/18/2017 111* 98 - 107 mmol/L Final   ??? CO2 09/18/2017 23.0  22.0 - 30.0 mmol/L Final   ??? BUN 09/18/2017 24* 7 - 21 mg/dL Final   ??? Creatinine 09/18/2017 1.83* 0.60 - 1.00 mg/dL Final   ??? BUN/Creatinine Ratio 09/18/2017 13   Final   ??? EGFR MDRD Non Af Amer 09/18/2017 27* >=60 mL/min/1.58m2 Final   ??? EGFR MDRD Af Amer 09/18/2017 33* >=60 mL/min/1.50m2 Final   ??? Anion Gap 09/18/2017 12  9 - 15 mmol/L Final   ??? Glucose 09/18/2017 102  65 - 179 mg/dL Final   ??? Calcium 16/06/9603 9.2  8.5 - 10.2 mg/dL Final   ??? Albumin 54/05/8118 4.3  3.5 - 5.0 g/dL Final   ??? Total Protein 09/18/2017 6.4* 6.5 - 8.3 g/dL Final   ??? Total Bilirubin 09/18/2017 1.3* 0.0 - 1.2 mg/dL Final   ??? AST 14/78/2956 18  14 - 38 U/L Final   ??? ALT 09/18/2017 18  15 - 48 U/L Final   ??? Alkaline Phosphatase 09/18/2017 60  38 - 126 U/L Final   ??? WBC 09/18/2017 3.4* 4.5 - 11.0 10*9/L Final   ??? RBC 09/18/2017 2.38* 4.00 - 5.20 10*12/L Final   ??? HGB 09/18/2017 8.3* 12.0 - 16.0 g/dL Final   ??? HCT 21/30/8657 24.6* 36.0 - 46.0 % Final   ??? MCV 09/18/2017 103.6* 80.0 - 100.0 fL Final   ??? MCH 09/18/2017 35.1* 26.0 - 34.0 pg Final   ??? MCHC 09/18/2017 33.9  31.0 - 37.0 g/dL Final   ??? RDW 84/69/6295 23.0* 12.0 - 15.0 % Final   ??? MPV 09/18/2017 10.8* 7.0 - 10.0 fL Final   ??? Platelet 09/18/2017 21* 150 - 440 10*9/L Final   ??? Variable HGB Concentration 09/18/2017 Moderate* Not Present Final   ??? Absolute Neutrophils 09/18/2017 2.4  2.0 - 7.5 10*9/L Final   ??? Absolute Lymphocytes 09/18/2017 0.8* 1.5 - 5.0 10*9/L Final   ??? Absolute Monocytes 09/18/2017 0.3  0.2 - 0.8 10*9/L Final   ??? Absolute Eosinophils 09/18/2017 0.0  0.0 - 0.4 10*9/L Final   ??? Absolute Basophils 09/18/2017 0.0  0.0 - 0.1 10*9/L Final   ??? Large Unstained Cells 09/18/2017 2  0 - 4 % Final   ??? Microcytosis 09/18/2017 Slight* Not Present Final   ??? Macrocytosis 09/18/2017 Marked* Not Present Final   ??? Anisocytosis 09/18/2017 Marked* Not Present Final   ??? Hyperchromasia 09/18/2017 Slight* Not Present Final   ??? Hypochromasia 09/18/2017 Slight* Not Present Final

## 2017-09-19 ENCOUNTER — Inpatient Hospital Stay: Payer: Medicare Other

## 2017-09-19 ENCOUNTER — Other Ambulatory Visit: Payer: Self-pay | Admitting: *Deleted

## 2017-09-19 ENCOUNTER — Telehealth: Payer: Self-pay | Admitting: *Deleted

## 2017-09-19 DIAGNOSIS — D46Z Other myelodysplastic syndromes: Secondary | ICD-10-CM

## 2017-09-19 DIAGNOSIS — C9202 Acute myeloblastic leukemia, in relapse: Secondary | ICD-10-CM | POA: Diagnosis not present

## 2017-09-19 DIAGNOSIS — I129 Hypertensive chronic kidney disease with stage 1 through stage 4 chronic kidney disease, or unspecified chronic kidney disease: Secondary | ICD-10-CM | POA: Diagnosis not present

## 2017-09-19 DIAGNOSIS — D4620 Refractory anemia with excess of blasts, unspecified: Secondary | ICD-10-CM | POA: Diagnosis not present

## 2017-09-19 DIAGNOSIS — Z87891 Personal history of nicotine dependence: Secondary | ICD-10-CM | POA: Diagnosis not present

## 2017-09-19 DIAGNOSIS — R7989 Other specified abnormal findings of blood chemistry: Secondary | ICD-10-CM

## 2017-09-19 DIAGNOSIS — D649 Anemia, unspecified: Secondary | ICD-10-CM

## 2017-09-19 DIAGNOSIS — N183 Chronic kidney disease, stage 3 (moderate): Secondary | ICD-10-CM | POA: Diagnosis not present

## 2017-09-19 DIAGNOSIS — D6959 Other secondary thrombocytopenia: Secondary | ICD-10-CM | POA: Diagnosis not present

## 2017-09-19 DIAGNOSIS — D630 Anemia in neoplastic disease: Secondary | ICD-10-CM | POA: Diagnosis not present

## 2017-09-19 LAB — CBC WITH DIFFERENTIAL/PLATELET
BASOS PCT: 1 %
Basophils Absolute: 0 10*3/uL (ref 0–0.1)
EOS ABS: 0 10*3/uL (ref 0–0.7)
Eosinophils Relative: 0 %
HCT: 22.9 % — ABNORMAL LOW (ref 35.0–47.0)
HEMOGLOBIN: 7.7 g/dL — AB (ref 12.0–16.0)
Lymphocytes Relative: 30 %
Lymphs Abs: 0.9 10*3/uL — ABNORMAL LOW (ref 1.0–3.6)
MCH: 35.1 pg — ABNORMAL HIGH (ref 26.0–34.0)
MCHC: 33.6 g/dL (ref 32.0–36.0)
MCV: 104.4 fL — ABNORMAL HIGH (ref 80.0–100.0)
Monocytes Absolute: 0.1 10*3/uL — ABNORMAL LOW (ref 0.2–0.9)
Monocytes Relative: 5 %
NEUTROS ABS: 1.9 10*3/uL (ref 1.4–6.5)
NEUTROS PCT: 64 %
Platelets: 19 10*3/uL — CL (ref 150–400)
RBC: 2.19 MIL/uL — AB (ref 3.80–5.20)
RDW: 23.6 % — ABNORMAL HIGH (ref 11.5–14.5)
WBC: 2.9 10*3/uL — AB (ref 3.6–11.0)

## 2017-09-19 LAB — PREPARE RBC (CROSSMATCH)

## 2017-09-19 LAB — SAMPLE TO BLOOD BANK

## 2017-09-19 MED ORDER — DIPHENHYDRAMINE HCL 25 MG PO CAPS
25.0000 mg | ORAL_CAPSULE | Freq: Once | ORAL | Status: AC
  Administered 2017-09-19: 25 mg via ORAL
  Filled 2017-09-19: qty 1

## 2017-09-19 MED ORDER — SODIUM CHLORIDE 0.9 % IV SOLN
250.0000 mL | Freq: Once | INTRAVENOUS | Status: AC
  Administered 2017-09-19: 250 mL via INTRAVENOUS
  Filled 2017-09-19: qty 250

## 2017-09-19 MED ORDER — ACETAMINOPHEN 325 MG PO TABS
650.0000 mg | ORAL_TABLET | Freq: Once | ORAL | Status: AC
  Administered 2017-09-19: 650 mg via ORAL
  Filled 2017-09-19: qty 2

## 2017-09-19 NOTE — Unmapped (Signed)
Left message for Dr. Donneta Romberg requesting that he recheck patient's creatinine next week during her routine lab work.     Mariana Kaufman, AGPCNP-BC  Nurse Practitioner  Hematology/Oncology  [facility]  09/19/2017

## 2017-09-19 NOTE — Unmapped (Signed)
1652-NS bolus complete. Repeat creatnine collected and sent. PIV saline locked to await the results of the post infusion creatnine level.       1802-Jessica Wilson, APP came to see the patient prior to discharge. Jessica updated the patient on the plan of care. She has cleared the patient for discharge today. PIV removed and patient discharged without complications.

## 2017-09-19 NOTE — Telephone Encounter (Signed)
I personally spoke with Dr. Rogue Bussing. MD would like patient to see NP next week at pt's schedule lab/possible blood transfusion apt.  Keep all f/u apts the same with Dr. Jacinto Reap.

## 2017-09-19 NOTE — Telephone Encounter (Signed)
-----   Message from Velora Mediate sent at 09/19/2017 11:13 AM EST ----- Regarding: Recheck per NP at [facility], NP from [facility] called to see if patient can be rechecked for increased creatine levels. Patient was given fluids but the Nurse Practitioner stated that she feels she needs to be rechecked and seen by Dr. Rogue Bussing  next week.   Please follow up with Melissa, her contact number is [phone].  Thank you, Lindley Magnus

## 2017-09-20 LAB — BPAM RBC
BLOOD PRODUCT EXPIRATION DATE: 201901152359
ISSUE DATE / TIME: 201901111202
Unit Type and Rh: 600

## 2017-09-20 LAB — TYPE AND SCREEN
ABO/RH(D): A POS
ANTIBODY SCREEN: NEGATIVE
Unit division: 0

## 2017-09-20 NOTE — Unmapped (Signed)
[facility]emia [facility] Follow-up    Patient Name: Jessica Wilson  Patient Age: 77 y.o.  Encounter Date: 09/18/2017    Primary Care Provider:  Tyson Dense, MD    Referring Physician:  Nigel Bridgeman, MD  [address]  [address]  [place], [place] [address]    Reason for visit:  Follow up of MDS progressed to AML, on enasidenib    History of Present Illness:  We had the pleasure of seeing Jessica Wilson in the Leukemia Clinic at the [place] [facility] [place] on 09/18/2017.  She is a 77 y.o. 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      Current therapy is enasidenib.    Her oncologic history is as follows:    Oncology History    Referring/Local Oncologist: Dr. Louretta Shorten, [facility]     Diagnosis: MDS with Excess Blasts-2    Genetics:   Karyotype/FISH: 46XX     Molecular Genetics: not performed    Pertinent Phenotypic data: no aberrant immunophenotype by flow cytometry, however blasts stained by IHC for CD117, MPO.  Only 5% marrow cells stained for CD34.    Disease-specific prognostic estimation: IPSS-R high risk, median OS 1.6 years, with 25% AML risk at 1.4 years            MDS (myelodysplastic syndrome), high grade (CMS-HCC)    04/17/2016 Initial Diagnosis     MDS (myelodysplastic syndrome), high grade (RAF-HCC)       04/29/2016 -  Chemotherapy     Azacitidine cycle 1: 75 mg/m2  [place] days 1-7 of 28-day cycles         05/27/2016 -  Chemotherapy     Azacitidine cycle 2: 75 mg/m2 [place] days 1-7 of 28 day cycles         06/24/2016 -  Chemotherapy     Azacitidine cycle 3: 75 mg/m2 [place] days 1-7 of 28 day cycle         07/22/2016 -  Chemotherapy     Azacitidine cycle 4: 75 mg/m2 [place] days 1-7 of 28 day cycle         08/19/2016 -  Chemotherapy     Azacitidine cycle 5: 75 mg/m2 [place] x 7 days of 28 day cycle         09/16/2016 -  Chemotherapy     Azacitidine cycle 6: 75 mbg/m2 [place] x 7 days of 28 day cycle         10/14/2016 -  Chemotherapy     Azacitidine cycle 7: 75 mg/m2 [place] x 7 days of 28 day cycle 12/09/2016 -  Chemotherapy     Azacitidine cycle 8: 60 mg/m2 [place] x 7 days of 28 day (cycle reduced by 20% due to hematologic toxicity)         01/13/2017 -  Chemotherapy     Azacitidine cycle 9: 60 mg/m2  x 5 days of 28 day cycle (reduced by 2 days and 20% per dose due to hematologic toxicity)         05/29/2017 Progression     Given increasing transfusion requirements, repeat bone marrow biopsy done, now with 35% blasts and meets criteria for progression to AML.           Acute myeloid leukemia not having achieved remission (CMS-HCC)    06/02/2017 Initial Diagnosis     Acute myeloid leukemia not having achieved remission         06/28/2017 - 07/17/2017 Chemotherapy  enasidenib 100 mg by mouth daily         07/17/2017 Adverse Reaction     Acute abdominal pain, splenomegaly, AKI , elevated transaminases.  Enasidenib held thru 11/13 and treated empirically for differentiation syndrome with dexamethasone. Resumed enasidenib 07/23/17         07/23/2017 -  Chemotherapy     enasidenib 100 mg PO daily          Interim History:  Since last seen here, [name] has continued on enasidenib. She developed a pneumonia in December and was treated with a course of Levaquin. She says that she started feeling better about the 1st of January. Her cough is improving, less frequent and less productive. Denies fevers. Denies bleeding.    Her energy is a tad better. She didn???t need the wheelchair to come in today. She didn???t eat much during the time she had the pneumonia and has lost some weight.    Denies headache. Denies CP. Denies SOB. Denies abd pain/n/v/d/c. Denies LE edema. Denies rash.    She urinates frequently at night and this is new since starting enasidenib. Denies dysuria or hematuria.    Otherwise, she denies new constitutional symptoms such as anorexia, weight loss night sweats or unexplained fevers.  Furthermore, she denies symptoms of marrow failure: unexplained bleeding or bruising, recurrent or unexplained intercurrent infections, dyspnea on exertion, lightheadedness, palpitations or chest pain.  There have been no new or unexplained pains or self-identified masses, swelling or enlarged lymph nodes.    Past Medical, Surgical and Family History were reviewed and pertinent updates were made in the Electronic Medical Record    Review of Systems:  Other than as reported above in the interim history, all other systems were negative.    ECOG Performance Status: 2    Medications:    Current Outpatient Prescriptions   Medication Sig Dispense Refill   ??? acetaminophen (TYLENOL) 325 MG tablet Take 650 mg by mouth every six (6) hours as needed for pain.     ??? acyclovir (ZOVIRAX) 400 MG tablet Take 400 mg by mouth Two (2) times a day.      ??? allopurinol (ZYLOPRIM) 300 MG tablet TAKE 1 TABLET(300 MG) BY MOUTH DAILY 90 tablet 0   ??? buPROPion (WELLBUTRIN) 100 MG tablet Take 300 mg by mouth daily at 0600.      ??? clonazePAM (KLONOPIN) 0.5 MG tablet Take 1 mg by mouth two (2) times a day as needed for anxiety.      ??? enasidenib (IDHIFA) 100 mg tablet Take 100 mg by mouth daily.     ??? metFORMIN (GLUCOPHAGE) 500 MG tablet Take 500 mg by mouth nightly.      ??? rosuvastatin (CRESTOR) 20 MG tablet Take 20 mg by mouth daily.      ??? sertraline (ZOLOFT) 100 MG tablet Take 150 mg by mouth daily.      ??? umeclidinium-vilanterol (ANORO ELLIPTA) 62.5-25 mcg/actuation inhaler Inhale 1 puff daily.       No current facility-administered medications for this visit.      Vital Signs:  Vitals:    09/18/17 1214   BP: 136/63   Pulse: 66   Resp: 18   Temp: 36.7 ??C (98 ??F)   SpO2: 98%     Physical Exam:  General: Resting, in no apparent distress  HEENT:  Right eye intermittently closed, non reactive to light. Left eye w/ PERRL. No scleral icterus or conjunctival injection. Oral mucosa without ulceration, erythema or exudate  Lymph node exam:  No lymphadenopathy in the anterior/posterior cervical, supraclavicular, axillary basins.  Heart: RRR, normal S1, S2. No m/r/g.  Lungs:  CTAB. No rales, ronchi or crackles.    GI:  No distention or pain on palpation.  Bowel sounds are present and normal in quality.  No palpable hepatomegaly or splenomegaly.    Skin: No rash.   Musculoskeletal:  No grossly-evident joint effusions or deformities.    Psychiatric:  Alert and oriented to person, place, time and situation.  Range of affect is appropriate.    Neurologic:  Gait is normal. Cerebellar tasks are symmetric and completed with ease.    Extremities:  Appear well-perfused. No clubbing, edema or cyanosis.    Relevant Laboratory, radiology and pathology results:  I personally viewed the most recent internal records and labs and discussed the available results with the patient or family.  A summary of results follows:  Hospital Outpatient Visit on 09/18/2017   Component Date Value Ref Range Status   ??? Creatinine 09/18/2017 1.62* 0.60 - 1.00 mg/dL Final   ??? EGFR MDRD Af Amer 09/18/2017 37* >=60 mL/min/1.91m2 Final   ??? EGFR MDRD Non Af Amer 09/18/2017 31* >=60 mL/min/1.79m2 Final   Appointment on 09/18/2017   Component Date Value Ref Range Status   ??? Uric Acid 09/18/2017 6.7* 3.0 - 6.5 mg/dL Final   Office Visit on 09/18/2017   Component Date Value Ref Range Status   ??? Color, UA 09/18/2017 Yellow   Final   ??? Clarity, UA 09/18/2017 Hazy   Final   ??? Specific Gravity, UA 09/18/2017 1.009  1.003 - 1.030 Final   ??? pH, UA 09/18/2017 5.5  5.0 - 9.0 Final   ??? Leukocyte Esterase, UA 09/18/2017 Trace* Negative Final   ??? Nitrite, UA 09/18/2017 Negative  Negative Final   ??? Protein, UA 09/18/2017 30 mg/dL* Negative Final   ??? Glucose, UA 09/18/2017 Negative  Negative Final   ??? Ketones, UA 09/18/2017 Negative  Negative Final   ??? Urobilinogen, UA 09/18/2017 0.2 mg/dL  0.2 mg/dL, 1.0 mg/dL Final   ??? Bilirubin, UA 09/18/2017 Negative  Negative Final   ??? Blood, UA 09/18/2017 Moderate* Negative Final   ??? RBC, UA 09/18/2017 9* <4 /HPF Final   ??? WBC, UA 09/18/2017 6* 0 - 5 /HPF Final   ??? Squam Epithel, UA 09/18/2017 <1  0 - 5 /HPF Final   ??? Bacteria, UA 09/18/2017 Occasional* None Seen /HPF Final   ??? Amorphous Crystal, UA 09/18/2017 Rare  /HPF Final   ??? Mucus, UA 09/18/2017 Rare* None Seen /HPF Final   ??? Bilirubin, Direct 09/18/2017 0.40  0.00 - 0.40 mg/dL Final   Lab on 16/06/9603   Component Date Value Ref Range Status   ??? Sodium 09/18/2017 146* 135 - 145 mmol/L Final   ??? Potassium 09/18/2017 4.2  3.5 - 5.0 mmol/L Final   ??? Chloride 09/18/2017 111* 98 - 107 mmol/L Final   ??? CO2 09/18/2017 23.0  22.0 - 30.0 mmol/L Final   ??? BUN 09/18/2017 24* 7 - 21 mg/dL Final   ??? Creatinine 09/18/2017 1.83* 0.60 - 1.00 mg/dL Final   ??? BUN/Creatinine Ratio 09/18/2017 13   Final   ??? EGFR MDRD Non Af Amer 09/18/2017 27* >=60 mL/min/1.21m2 Final   ??? EGFR MDRD Af Amer 09/18/2017 33* >=60 mL/min/1.67m2 Final   ??? Anion Gap 09/18/2017 12  9 - 15 mmol/L Final   ??? Glucose 09/18/2017 102  65 - 179 mg/dL Final   ??? Calcium  09/18/2017 9.2  8.5 - 10.2 mg/dL Final   ??? Albumin 16/60/6301 4.3  3.5 - 5.0 g/dL Final   ??? Total Protein 09/18/2017 6.4* 6.5 - 8.3 g/dL Final   ??? Total Bilirubin 09/18/2017 1.3* 0.0 - 1.2 mg/dL Final   ??? AST 60/06/9322 18  14 - 38 U/L Final   ??? ALT 09/18/2017 18  15 - 48 U/L Final   ??? Alkaline Phosphatase 09/18/2017 60  38 - 126 U/L Final   ??? WBC 09/18/2017 3.4* 4.5 - 11.0 10*9/L Final   ??? RBC 09/18/2017 2.38* 4.00 - 5.20 10*12/L Final   ??? HGB 09/18/2017 8.3* 12.0 - 16.0 g/dL Final   ??? HCT 55/73/2202 24.6* 36.0 - 46.0 % Final   ??? MCV 09/18/2017 103.6* 80.0 - 100.0 fL Final   ??? MCH 09/18/2017 35.1* 26.0 - 34.0 pg Final   ??? MCHC 09/18/2017 33.9  31.0 - 37.0 g/dL Final   ??? RDW 54/27/0623 23.0* 12.0 - 15.0 % Final   ??? MPV 09/18/2017 10.8* 7.0 - 10.0 fL Final   ??? Platelet 09/18/2017 21* 150 - 440 10*9/L Final   ??? Variable HGB Concentration 09/18/2017 Moderate* Not Present Final   ??? Absolute Neutrophils 09/18/2017 2.4  2.0 - 7.5 10*9/L Final   ??? Absolute Lymphocytes 09/18/2017 0.8* 1.5 - 5.0 10*9/L Final   ??? Absolute Monocytes 09/18/2017 0.3  0.2 - 0.8 10*9/L Final   ??? Absolute Eosinophils 09/18/2017 0.0  0.0 - 0.4 10*9/L Final   ??? Absolute Basophils 09/18/2017 0.0  0.0 - 0.1 10*9/L Final   ??? Large Unstained Cells 09/18/2017 2  0 - 4 % Final   ??? Microcytosis 09/18/2017 Slight* Not Present Final   ??? Macrocytosis 09/18/2017 Marked* Not Present Final   ??? Anisocytosis 09/18/2017 Marked* Not Present Final   ??? Hyperchromasia 09/18/2017 Slight* Not Present Final   ??? Hypochromasia 09/18/2017 Slight* Not Present Final   ??? Smear Review Comments 09/18/2017 See Comment* Undefined Final    Myelocytes present-rare.       Assessment:  [name] is a 77 y.o. year old female with AML progressed from previous Myelodysplastic Syndrome s/p 12 cycles of azacitidine prior to progression to AML. She initially had hematologic improvement but had eventual progression requiring more transfusions and with 38% blasts in the bone marrow. She was started on enasidenib on 06/28/17. She had a hospitalization for LUQ pain was likely due to be due to splenomegaly which has now resolved. She presents to clinic for a follow up appointment.    [name] had an interim bout with pneumonia, treated with levaquin, now mostly resolved. I reviewed with [name] that there are encouraging signs that enasidenib is having a positive effect on her disease burden ??? namely resolution of circulating blasts and reduced frequency of transfusions. We will continue enasidenib and not pursue repeat bone marrow biopsy unless there is a drop in her blood counts. She will continue count checks w/ PRN transfusions locally and we will see her back in four weeks.    [name] has AKI today. Dr. Malen Gauze joined our visit to review with her that he does not think this is due to differentiation syndrome. More likely, it is related to the antibiotic given for her pneumonia. I will give her fluids and recheck her creatinine today in our infusion center.    Plan and Recommendations: **AML, secondary after MDS  - Continue enasidenib 100 mg daily  - Labs 2-3x/week locally w/ PRN transfusions  -  Monitor serum bilirubin, mostly indirect  - RTC in four weeks    TLS prophylaxis:  - Cont allopurinol 300 mg by mouth daily  ??  Nocturia:  - UA negative    Elevated creatinine:  - 1 liter NS bolus today  - Recheck creatinine  - Will ask local oncologist to recheck creatinine next week.    Dr. Malen Gauze was available.    Mariana Kaufman, AGPCNP-BC  Nurse Practitioner  Hematology/Oncology  [facility]  09/18/2017

## 2017-09-21 ENCOUNTER — Other Ambulatory Visit: Payer: Self-pay | Admitting: Family Medicine

## 2017-09-21 DIAGNOSIS — E785 Hyperlipidemia, unspecified: Secondary | ICD-10-CM

## 2017-09-21 DIAGNOSIS — E1121 Type 2 diabetes mellitus with diabetic nephropathy: Secondary | ICD-10-CM

## 2017-09-23 DIAGNOSIS — E113293 Type 2 diabetes mellitus with mild nonproliferative diabetic retinopathy without macular edema, bilateral: Secondary | ICD-10-CM | POA: Diagnosis not present

## 2017-09-23 NOTE — Unmapped (Signed)
[facility] Refill Coordination Note  Specialty Medication(s): IDHIFA 100mg       Jessica Wilson, DOB: [birth year]  Phone: [phone] (home) [phone] (work), Alternate phone contact: N/A  Phone or address changes today?: No  All above HIPAA information was verified with patient.  Shipping Address: [address]  [place] [address]   Insurance changes? No    Completed refill call assessment today to schedule patient's medication shipment from the [facility] [phone]).      Confirmed the medication and dosage are correct and have not changed: Yes, regimen is correct and unchanged.    Confirmed patient started or stopped the following medications in the past month:  No, there are no changes reported at this time.    Are you tolerating your medication?:  [name] reports tolerating the medication.    ADHERENCE    Is this medicine transplant or covered by Medicare Part B? No.        Did you miss any doses in the past 4 weeks? No missed doses reported.    FINANCIAL/SHIPPING    Delivery Scheduled: Yes, Expected medication delivery date: 09/29/17     [name] did not have any additional questions at this time.    Delivery address validated in [facility] scheduling system: Yes, address listed in [facility] is correct.    We will follow up with patient monthly for standard refill processing and delivery.      Thank you,  [employer]   [facility] Specialty Pharmacist

## 2017-09-24 ENCOUNTER — Encounter: Payer: Self-pay | Admitting: Family Medicine

## 2017-09-24 ENCOUNTER — Ambulatory Visit (INDEPENDENT_AMBULATORY_CARE_PROVIDER_SITE_OTHER): Payer: Medicare Other | Admitting: Family Medicine

## 2017-09-24 VITALS — BP 132/78 | HR 85 | Temp 98.0°F | Resp 16 | Wt 222.2 lb

## 2017-09-24 DIAGNOSIS — E785 Hyperlipidemia, unspecified: Secondary | ICD-10-CM | POA: Diagnosis not present

## 2017-09-24 DIAGNOSIS — C9202 Acute myeloblastic leukemia, in relapse: Secondary | ICD-10-CM | POA: Diagnosis not present

## 2017-09-24 DIAGNOSIS — E790 Hyperuricemia without signs of inflammatory arthritis and tophaceous disease: Secondary | ICD-10-CM

## 2017-09-24 DIAGNOSIS — D61818 Other pancytopenia: Secondary | ICD-10-CM

## 2017-09-24 DIAGNOSIS — R3129 Other microscopic hematuria: Secondary | ICD-10-CM | POA: Diagnosis not present

## 2017-09-24 DIAGNOSIS — E1121 Type 2 diabetes mellitus with diabetic nephropathy: Secondary | ICD-10-CM | POA: Diagnosis not present

## 2017-09-24 DIAGNOSIS — I480 Paroxysmal atrial fibrillation: Secondary | ICD-10-CM

## 2017-09-24 DIAGNOSIS — J449 Chronic obstructive pulmonary disease, unspecified: Secondary | ICD-10-CM | POA: Diagnosis not present

## 2017-09-24 HISTORY — DX: Morbid (severe) obesity due to excess calories: E66.01

## 2017-09-24 NOTE — Unmapped (Signed)
Spoke with Dr Sherie Don on b/o Dr Malen Gauze regarding patient. She is her new PCP, currently monitoring her diabetes. Due to  kidney function Dr Sherie Don has offered uloric as possible alternative to allopurinol. If team in agreement, she would like to request we write Rx for patient. In addition, she would like [place] to know she discontinued metformin for same reason (kidney fx). Dr Malen Gauze and Corliss Marcus Pharm D notified.

## 2017-09-24 NOTE — Unmapped (Signed)
Hi,    Dr. Sherie Don has called requesting to speak with you directly regarding the following:    Medication Consult    Dr. Sherie Don is available for a call back between now.  The best number to call back is [phone]. Dr. Sherie Don would like to speak with you as soon as possible, while the patient is in her office.     A page has also been sent.    Thank you,  Drema Balzarine  [facility]  [phone]

## 2017-09-24 NOTE — Progress Notes (Signed)
BP 132/78   Pulse 85   Temp 98 F (36.7 C) (Oral)   Resp 16   Wt 222 lb 3.2 oz (100.8 kg)   SpO2 98%   BMI 38.14 kg/m    Subjective:    Patient ID: Teresa Carrillo, female    DOB: [birth year], 77 y.o.   MRN: [id number]  HPI: Teresa Carrillo is a 77 y.o. female  Chief Complaint  Patient presents with  . Medication Refill    HPI Patient is here as a new patient to me, usually seen by another provider in our practice who is leaving; she will be my patient after he leaves  She has acute myelogenous leukemia and is followed closely by the heme-onc specialist; she has pancytopenia; last set of labs reviewed; labs from [facility] and [facility] viewed; she used to have proctitis and mouth ulcers from chemo, but those problems have resolved  She has type 2 diabetes; she does not eat a lot of sweets; drinks some juices, cranberry and grape but they are diet; has been checking sugars; rare diet drink; used to like sweets, but really cut it out with her cancer; lost down from 258 pounds; she says she takes metformin, but I don't see that on her current med list (?); her last Cr at [facility] was 1.62 Lab Results  Component Value Date   HGBA1C 6.3 06/17/2017   She sees a kidney doctor she says; then she says they treat her for her kidneys too She is on allopurinol for tumor lysis syndrome, prescribed by [facility]; her last Cr was 1.62, GFR 31  High cholesterol; eats some cheese every now and then; occasionally has bacon and eggs for supper; I asked about fam hx with high cholesterol; patient's mother died at age 59; sister does not have high cholesterol; oldest brother is 93 or 20 and he has diabetes; no problems with the statin Lab Results  Component Value Date   CHOL 123 01/23/2017   CHOL 137 01/04/2016   CHOL 151 05/05/2015   Lab Results  Component Value Date   HDL 26 (L) 01/23/2017   HDL 42 01/04/2016   HDL 48 05/05/2015   Lab Results  Component Value Date   LDLCALC 61 01/23/2017   LDLCALC 72  01/04/2016   [place] 78 05/05/2015   Lab Results  Component Value Date   TRIG 178 (H) 01/23/2017   TRIG 116 01/04/2016   TRIG 124 05/05/2015   Lab Results  Component Value Date   CHOLHDL 4.7 01/23/2017   CHOLHDL 3.3 01/04/2016   CHOLHDL 3.1 05/05/2015   No results found for: LDLDIRECT  Hospitalized with pneumonia last month; sees pulmonologist, Dr. Raul Del; has COPD; taking Anoro  She sees Rein in [place] prescribes the clonazepam and the bupropion and sertraline; with her cancer, I asked how her mood has been through all this; she is not depressed; she cried for a few minutes when she heard the cancer news, then figured that's what it is and she "can't do nothing about it"  Taking diltiazem for her heart, to control atrial fibrillation; had one episode, sees Dr. Clayborn Bigness  Hematuria since childhood; noted the blood on the urine and she says it's always there  CKD stage 3 / 4; reviewed recent GFR and creatinine; I asked if she is seeing a nephrologist and she thinks she is at [facility], saying they take care of her kidneys there; however, I reviewed [place] and don't specifically see a nephrologist listed  Depression screen [facility] 2/9 09/24/2017 12/25/2016  Decreased Interest 0 0  Down, Depressed, Hopeless 0 0  PHQ - 2 Score 0 0  Some recent data might be hidden    Relevant past medical, surgical, family and social history reviewed Past Medical History:  Diagnosis Date  . Abdominal wall mass   . Anginal pain ([place])   . Anxiety   . Arthritis   . Calculus of kidney   . Cystitis   . Depression   . Diabetes mellitus without complication (HCC)    elevated A1c  . Dyspnea on exertion   . Elevated serum creatinine   . Fibrocystic breast disease   . GERD (gastroesophageal reflux disease)   . Hearing loss   . Heart murmur   . HTN (hypertension)   . Hyperlipidemia   . MDS (myelodysplastic syndrome), high grade ([place]) 04/23/2016  . Microscopic hematuria   . Mouth sores   .  Mucositis due to chemotherapy   . Obesity   . Obesity, morbid ([place]) 09/24/2017   BMI > 35 with type 2 diabetes  . Risk for falls   . Sleep apnea   . Thrombocytopenia ([place])   . Urinary frequency   . Urinary urgency    Past Surgical History:  Procedure Laterality Date  . ABDOMINAL HYSTERECTOMY    . APPENDECTOMY    . CARDIAC CATHETERIZATION     x2  . CHOLECYSTECTOMY    . COLONOSCOPY N/A 02/24/2015   Procedure: COLONOSCOPY;  Surgeon: Manya Silvas, MD;  Location: [facility] ENDOSCOPY;  Service: Endoscopy;  Laterality: N/A;  . DIAGNOSTIC LAPAROSCOPY     Removal of benign abdominal tumor  . ESOPHAGOGASTRODUODENOSCOPY N/A 02/24/2015   Procedure: ESOPHAGOGASTRODUODENOSCOPY (EGD);  Surgeon: Manya Silvas, MD;  Location: [facility] ENDOSCOPY;  Service: Endoscopy;  Laterality: N/A;  . right eye surgery Right    Family History  Problem Relation Age of Onset  . Congestive Heart Failure Mother   . Diabetes Mother   . Coronary artery disease Mother   . Stroke Mother   . Cirrhosis Father   . Diabetes Brother    Social History   Tobacco Use  . Smoking status: Former Smoker    Types: Cigarettes    Last attempt to quit: 09/09/1988    Years since quitting: 29.0  . Smokeless tobacco: Never Used  . Tobacco comment: quit 25 years ago  Substance Use Topics  . Alcohol use: No    Alcohol/week: 0.0 oz  . Drug use: No    Interim medical history since last visit reviewed. Allergies and medications reviewed  Review of Systems Per HPI unless specifically indicated above     Objective:    BP 132/78   Pulse 85   Temp 98 F (36.7 C) (Oral)   Resp 16   Wt 222 lb 3.2 oz (100.8 kg)   SpO2 98%   BMI 38.14 kg/m   Wt Readings from Last 3 Encounters:  09/24/17 222 lb 3.2 oz (100.8 kg)  09/15/17 224 lb 6.4 oz (101.8 kg)  08/25/17 225 lb (102.1 kg)    Physical Exam  Constitutional: She appears well-developed and well-nourished. No distress.  obese  HENT:  Head: Normocephalic and atraumatic.    Eyes: EOM are normal. No scleral icterus.  Asymmetry of eyes, drooping of right lid  Neck: No thyromegaly present.  Cardiovascular: Normal rate, regular rhythm and normal heart sounds.  No murmur heard. Pulmonary/Chest: Effort normal and breath sounds normal. No respiratory distress. She has no wheezes.  Abdominal: Soft. Bowel sounds are normal. She exhibits no distension.  Musculoskeletal: Normal range of motion. She exhibits no edema.  Neurological: She is alert.  Skin: Skin is warm and dry. She is not diaphoretic. No pallor.  Psychiatric: She has a normal mood and affect. Her behavior is normal. Judgment and thought content normal. Her mood appears not anxious. She does not exhibit a depressed mood.   Diabetic Foot Form - Detailed   Diabetic Foot Exam - detailed Diabetic Foot exam was performed with the following findings:  Yes 09/24/2017 11:33 AM  Visual Foot Exam completed.:  Yes  Pulse Foot Exam completed.:  Yes  Right Dorsalis Pedis:  Present Left Dorsalis Pedis:  Present  Sensory Foot Exam Completed.:  Yes Semmes-Weinstein Monofilament Test R Site 1-Great Toe:  Pos L Site 1-Great Toe:  Pos        Results for orders placed or performed in visit on 09/19/17  Prepare RBC  Result Value Ref Range   Order Confirmation      ORDER PROCESSED BY BLOOD BANK Performed at [facility], [address] [place], [place] [address]   Type and screen  Result Value Ref Range   ABO/RH(D) A POS    Antibody Screen NEG    Sample Expiration 09/22/2017    Unit Number [id number]    Blood Component Type RED CELLS,LR    Unit division 00    Status of Unit ISSUED,FINAL    Transfusion Status OK TO TRANSFUSE    Crossmatch Result      Compatible Performed at [facility], [address] [place], [place] [address]   BPAM RBC  Result Value Ref Range   ISSUE DATE / TIME [id number]    Blood Product Unit Number [id number]    PRODUCT CODE [id number]    Unit Type and Rh  0600    Blood Product Expiration Date [id number]       Assessment & Plan:   Problem List Items Addressed This Visit      Cardiovascular and Mediastinum   Paroxysmal atrial fibrillation ([place])    Managed by cardiologist; sounds to be in NSR today        Respiratory   COPD, mild (HCC) (Chronic)    Sees Dr. Raul Del      Relevant Medications   umeclidinium-vilanterol (ANORO ELLIPTA) 62.5-25 MCG/INH AEPB     Endocrine   Type 2 diabetes mellitus with nephropathy (HCC) - Primary (Chronic)    Check A1c with next lab draw; STOP metformin (if she is taking it, not on med list); advised she should NOT take metformin with her current kidney function; praise given for diet changes, weight loss effort      Relevant Orders   Lipid panel   Hemoglobin A1c   COMPLETE METABOLIC PANEL WITH GFR   Microalbumin / creatinine urine ratio     Genitourinary   Microscopic hematuria (Chronic)    Chronic issue per patient        Hematopoietic and Hemostatic   Pancytopenia ([place])    From the leukemia, followed closely by heme-onc        Other   Obesity, morbid ([place]) (Chronic)    Praise given for patient's weight loss efforts; encouragement given      Dyslipidemia (Chronic)    Check lipids      Acute myeloid leukemia in relapse ([place])    Followed closely by heme-onc; we called the [facility] specialist, as I'm concerned about the allopurinol with her current  CrCl and suggested Uloric, if that will be an acceptable alternative; I will respectfully defer that decision and prescribing to her specialist; told patient to expect a call about what to do with her allopurinol      Relevant Medications   IDHIFA 100 MG TABS   acyclovir (ZOVIRAX) 400 MG tablet   Other Relevant Orders   COMPLETE METABOLIC PANEL WITH GFR    Other Visit Diagnoses    Elevated uric acid in blood       tumor lysis from cellular turnover due to leukemia; call put in to [facility] heme-onc to see if allopurinol can be switched to  Uloric b/c of CrCl       Follow up plan: Return in about 3 months (around 12/23/2017) for follow-up visit with Dr. Sanda Klein.  An after-visit summary was printed and given to the patient at [facility].  Please see the patient instructions which may contain other information and recommendations beyond what is mentioned above in the assessment and plan.  No orders of the defined types were placed in this encounter.   Orders Placed This Encounter  Procedures  . Lipid panel  . Hemoglobin A1c  . COMPLETE METABOLIC PANEL WITH GFR  . Microalbumin / creatinine urine ratio   Face-to-face time with patient was more than 40 minutes, >50% time spent counseling and coordination of care  ----------------------------------------- Roselyn Reef called back; she spoke to Dr. Royce Macadamia and thought Uloric was a good idea for TLS, less nephrotoxic; I asked if they will prescribe and she said she would ask him to do that; agrees with stopping metformin Roselyn Reef nurse navigator [phone] I personally spoke with patient after my conversation with the nurse navigator I asked her to write this down; explained for uric acid They agreed with stopping metformin too and patient agrees

## 2017-09-24 NOTE — Assessment & Plan Note (Addendum)
Managed by cardiologist; sounds to be in NSR today

## 2017-09-24 NOTE — Assessment & Plan Note (Signed)
Sees Dr. Raul Del

## 2017-09-24 NOTE — Assessment & Plan Note (Signed)
Chronic issue per patient

## 2017-09-24 NOTE — Assessment & Plan Note (Signed)
Praise given for patient's weight loss efforts; encouragement given

## 2017-09-24 NOTE — Assessment & Plan Note (Addendum)
Followed closely by heme-onc; we called the [facility] specialist, as I'm concerned about the allopurinol with her current CrCl and suggested Uloric, if that will be an acceptable alternative; I will respectfully defer that decision and prescribing to her specialist; told patient to expect a call about what to do with her allopurinol

## 2017-09-24 NOTE — Assessment & Plan Note (Addendum)
Check A1c with next lab draw; STOP metformin (if she is taking it, not on med list); advised she should NOT take metformin with her current kidney function; praise given for diet changes, weight loss effort

## 2017-09-24 NOTE — Assessment & Plan Note (Signed)
Check lipids 

## 2017-09-24 NOTE — Assessment & Plan Note (Signed)
From the leukemia, followed closely by heme-onc

## 2017-09-24 NOTE — Patient Instructions (Addendum)
Keep up the great job with weight loss  Please do see your eye doctor regularly, and have your eyes examined every year (or more often per his or her recommendation) Check your feet every night and let me know right away of any sores, infections, numbness, etc. Try to limit sweets, white bread, white rice, white potatoes It is okay with me for you to not check your fingerstick blood sugars (per [employer] of Endocrinology Best Practices), unless you are interested and feel it would be helpful for you  Talk with your psychiatrist about perhaps weaning off some of your medicines to see if that helps  Do not take metformin I'll talk with your [facility] doctor about the allopurinol and may switch to [employer]

## 2017-09-25 LAB — MICROALBUMIN / CREATININE URINE RATIO
CREATININE, URINE: 40 mg/dL (ref 20–275)
MICROALB UR: 42 mg/dL
MICROALB/CREAT RATIO: 1050 ug/mg{creat} — AB (ref <30)

## 2017-09-25 MED FILL — IDHIFA/100 MG/TAB: IDHIFA/100 MG/TAB | 30 days supply | Qty: 30 | Fill #3

## 2017-09-25 NOTE — Unmapped (Signed)
Patient Jessica Wilson called to follow up on a new prescription. Patient tells me that her new PCP Dr. Sherie Don spoke with Gwyneth Sprout yesterday about irregular labs indicative of kidney problems. Patient was told that a new prescription was being written but she did not receive it and there's no new rx in chart.    Please contact Jessica Wilson at [phone].

## 2017-09-26 ENCOUNTER — Telehealth: Payer: Self-pay | Admitting: Family Medicine

## 2017-09-26 ENCOUNTER — Encounter: Payer: Self-pay | Admitting: Nurse Practitioner

## 2017-09-26 ENCOUNTER — Inpatient Hospital Stay: Payer: Medicare Other

## 2017-09-26 ENCOUNTER — Other Ambulatory Visit: Payer: Self-pay

## 2017-09-26 ENCOUNTER — Inpatient Hospital Stay (HOSPITAL_BASED_OUTPATIENT_CLINIC_OR_DEPARTMENT_OTHER): Payer: Medicare Other | Admitting: Nurse Practitioner

## 2017-09-26 VITALS — BP 164/83 | HR 76 | Temp 97.9°F | Resp 20

## 2017-09-26 DIAGNOSIS — Z87891 Personal history of nicotine dependence: Secondary | ICD-10-CM | POA: Diagnosis not present

## 2017-09-26 DIAGNOSIS — D6959 Other secondary thrombocytopenia: Secondary | ICD-10-CM | POA: Diagnosis not present

## 2017-09-26 DIAGNOSIS — D4622 Refractory anemia with excess of blasts 2: Secondary | ICD-10-CM

## 2017-09-26 DIAGNOSIS — I129 Hypertensive chronic kidney disease with stage 1 through stage 4 chronic kidney disease, or unspecified chronic kidney disease: Secondary | ICD-10-CM

## 2017-09-26 DIAGNOSIS — N183 Chronic kidney disease, stage 3 (moderate): Secondary | ICD-10-CM

## 2017-09-26 DIAGNOSIS — D4620 Refractory anemia with excess of blasts, unspecified: Secondary | ICD-10-CM | POA: Diagnosis not present

## 2017-09-26 DIAGNOSIS — D630 Anemia in neoplastic disease: Secondary | ICD-10-CM

## 2017-09-26 DIAGNOSIS — R7989 Other specified abnormal findings of blood chemistry: Secondary | ICD-10-CM

## 2017-09-26 DIAGNOSIS — C9200 Acute myeloblastic leukemia, not having achieved remission: Secondary | ICD-10-CM

## 2017-09-26 DIAGNOSIS — C9202 Acute myeloblastic leukemia, in relapse: Secondary | ICD-10-CM

## 2017-09-26 LAB — BASIC METABOLIC PANEL
Anion gap: 10 (ref 5–15)
BUN: 24 mg/dL — AB (ref 6–20)
CO2: 26 mmol/L (ref 22–32)
CREATININE: 1.59 mg/dL — AB (ref 0.44–1.00)
Calcium: 9 mg/dL (ref 8.9–10.3)
Chloride: 105 mmol/L (ref 101–111)
GFR calc Af Amer: 35 mL/min — ABNORMAL LOW (ref >60)
GFR calc non Af Amer: 30 mL/min — ABNORMAL LOW (ref >60)
Glucose, Bld: 129 mg/dL — ABNORMAL HIGH (ref 65–99)
POTASSIUM: 3.7 mmol/L (ref 3.5–5.1)
SODIUM: 141 mmol/L (ref 135–145)

## 2017-09-26 LAB — CBC WITH DIFFERENTIAL/PLATELET
BASOS ABS: 0 10*3/uL (ref 0–0.1)
Basophils Relative: 1 %
EOS ABS: 0 10*3/uL (ref 0–0.7)
Eosinophils Relative: 0 %
HEMATOCRIT: 25.4 % — AB (ref 35.0–47.0)
HEMOGLOBIN: 8.5 g/dL — AB (ref 12.0–16.0)
LYMPHS PCT: 32 %
Lymphs Abs: 0.8 10*3/uL — ABNORMAL LOW (ref 1.0–3.6)
MCH: 34.9 pg — ABNORMAL HIGH (ref 26.0–34.0)
MCHC: 33.6 g/dL (ref 32.0–36.0)
MCV: 103.7 fL — ABNORMAL HIGH (ref 80.0–100.0)
MONOS PCT: 6 %
Monocytes Absolute: 0.2 10*3/uL (ref 0.2–0.9)
NEUTROS PCT: 61 %
Neutro Abs: 1.5 10*3/uL (ref 1.4–6.5)
Platelets: 17 10*3/uL — CL (ref 150–400)
RBC: 2.45 MIL/uL — ABNORMAL LOW (ref 3.80–5.20)
RDW: 22 % — ABNORMAL HIGH (ref 11.5–14.5)
WBC: 2.5 10*3/uL — ABNORMAL LOW (ref 3.6–11.0)

## 2017-09-26 LAB — SAMPLE TO BLOOD BANK

## 2017-09-26 NOTE — Unmapped (Signed)
AOC Triage Note     Patient: Jessica Wilson     Reason for call:  Return call    Time call returned: 1408     Phone Assessment: Spoke with patient and she would like to talk to Gwyneth Sprout RN regarding the prescription that Marijean Niemann spoke to her PCP about but she did not know the name.     Triage Recommendations: Will inform Marijean Niemann of patients wishes.     Patient Response: grateful     Outstanding tasks: Marijean Niemann to return patients call regarding her questions that came up after her PCP spoke to [place].     Patient Pharmacy has been verified and primary pharmacy has been marked as preferred

## 2017-09-26 NOTE — Unmapped (Signed)
[facility] Triage Note     Patient: Jessica Wilson     Reason for call:  Patient calling to follow up on new prescription.    Time call returned: 0938     Phone Assessment: No answer, voice mail     Triage Recommendations: After reaching out to Gwyneth Sprout RN regarding this, she will talk with Dr. Malen Gauze and call patient back in a couple of hours due to meetings and that they are aware of patients creatinine level and are watching this and nothing for patient to be alarmed about at this time.     Patient Response: unknown     Outstanding tasks: Care team notification no further actions needed      Patient Pharmacy has been verified and primary pharmacy has been marked as preferred

## 2017-09-26 NOTE — Unmapped (Signed)
Phoned pt x 2 on home phone, mobile and work phone regarding Rx question, unable to reach at this time. NN will make another call attempt before end of business day.     2pm: Phoned pt back, followed up on creatinine, let her know per Dr Malen Gauze okay to stop the allopurinol, and we do not plan to order uloric at this time. Patient reports local oncologist provided same instructions today and she has stopped the allopurinol. Patient reports she is feeling well over all, new supply of idhifa will be delivered on Monday.

## 2017-09-26 NOTE — Unmapped (Signed)
Pt was returning your call. She can be reached at [phone].  Thanks in advance,  Deatra Ina  [facility]  [phone]

## 2017-09-26 NOTE — Progress Notes (Signed)
[place] NOTE  Patient Care Team: Lada, Satira Anis, [name] Cammie Sickle, MD as Consulting Physician (Internal Medicine) Gloriann Loan, MD as Attending Physician Erby Pian, MD as Referring Physician (Specialist) Yolonda Kida, MD as Consulting Physician (Cardiology) Albertine Patricia, [place] as Attending Physician (Podiatry) Lavonia Dana, MD as Consulting Physician (Nephrology)  CHIEF COMPLAINTS/PURPOSE OF CONSULTATION:   Oncology History   #JUNE 2017- Severe neutropenia/ Anemia ~hb 9/platlets- 85-100 AUG 2017- REFRACTORY ANEMIA with EXCESS BLASTS [14% blasts- BMBx]; cytogenetics/FISH-N [SNP micorarray- not done]   # AUG 21st 2017-  START AZA 91m/m2 day- 1-7 q 28 days x4 cycles; DEC 6th- BMBx- <5% blasts; hypercellular with dysplasia.   # SEP 20th 2018- ACUTE MYELOID LEUKEMIA [38% blasts on BMBx]; IDH-MUTATION PRESENT; low FLT-3; OCT 18th 2018- ENASIDENIB [Dr.Foster; [facility]]   # CKD stage III  ------------------------------------------------------   MOLECULAR TESTING: NGS sent 06/2017 -FLT3-ITD <0.05 mutational burden -IDH2 -DNMT3A --treatment options include FLT3 inhibition and IDH2 inhibition; given low level FFTD3-UKGallelic ratio [admittedly in peripheral blood, not bone marrow]- started on ENASIDENIB     MDS (myelodysplastic syndrome), high grade ([place]   04/23/2016 Initial Diagnosis    MDS (myelodysplastic syndrome), high grade (HCC)       Acute myeloid leukemia in relapse ([place]     HISTORY OF PRESENTING ILLNESS:  Teresa LACHAPELLE89y.o.  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  Patient is has completed Levaquin & prednisone for bronchitis/bronchopneuomnia. She states her symptoms have resolved. She feels well. Denies nosebleeds, bleeding gums, or petechial type rash. Denies nausea, vomiting or diarrhea.  Chronic mild fatigue which is unchanged. Denies fever or chills. Trying to lose weight. PCP has stopped metformin and crestor d/t kidney function. Discussion was had over switching from allopurinol to Uloric d/t declining kidney function. Patient sees Dr. KJuleen Chinafor nephrology. Notes unavailable. She would like to have a port placed as IV sticks have become increasingly difficult.   ROS: A complete 10 point review of system is done which is negative except mentioned above in history of present illness.  MEDICAL HISTORY:  Past Medical History:  Diagnosis Date  . Abdominal wall mass   . Anginal pain ([place]   . Anxiety   . Arthritis   . Calculus of kidney   . Cystitis   . Depression   . Diabetes mellitus without complication (HCC)    elevated A1c  . Dyspnea on exertion   . Elevated serum creatinine   . Fibrocystic breast disease   . GERD (gastroesophageal reflux disease)   . Hearing loss   . Heart murmur   . HTN (hypertension)   . Hyperlipidemia   . MDS (myelodysplastic syndrome), high grade ([place] 04/23/2016  . Microscopic hematuria   . Mouth sores   . Mucositis due to chemotherapy   . Obesity   . Obesity, morbid ([place] 09/24/2017   BMI > 35 with type 2 diabetes  . Risk for falls   . Sleep apnea   . Thrombocytopenia (H[place]   . Urinary frequency   . Urinary urgency     SURGICAL HISTORY: Past Surgical History:  Procedure Laterality Date  . ABDOMINAL HYSTERECTOMY    . APPENDECTOMY    . CARDIAC CATHETERIZATION     x2  . CHOLECYSTECTOMY    . COLONOSCOPY N/A 02/24/2015   Procedure: COLONOSCOPY;  Surgeon: RManya Silvas  MD;  Location: [facility] ENDOSCOPY;  Service: Endoscopy;  Laterality: N/A;  . DIAGNOSTIC LAPAROSCOPY     Removal of benign abdominal tumor  . ESOPHAGOGASTRODUODENOSCOPY N/A 02/24/2015   Procedure: ESOPHAGOGASTRODUODENOSCOPY (EGD);  Surgeon: Manya Silvas, MD;  Location: [facility] ENDOSCOPY;  Service: Endoscopy;  Laterality: N/A;  . 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 No smoking/ no alcohol.  Social History   Socioeconomic History  . Marital status: Married    Spouse name: Not on file  . Number of children: Not on file  . Years of education: Not on file  . Highest education level: Not on file  Social Needs  . Financial resource strain: Not on file  . Food insecurity - worry: Not on file  . Food insecurity - inability: Not on file  . Transportation needs - medical: Not on file  . Transportation needs - non-medical: Not on file  Occupational History  . Not on file  Tobacco Use  . Smoking status: Former Smoker    Types: Cigarettes    Last attempt to quit: 09/09/1988    Years since quitting: 29.0  . Smokeless tobacco: Never Used  . Tobacco comment: quit 25 years ago  Substance and Sexual Activity  . Alcohol use: No    Alcohol/week: 0.0 oz  . Drug use: No  . Sexual activity: Not Currently    Birth control/protection: Post-menopausal  Other Topics Concern  . Not on file  Social History Narrative  . Not on file    FAMILY HISTORY: no cancers in family.  Family History  Problem Relation Age of Onset  . Congestive Heart Failure Mother   . Diabetes Mother   . Coronary artery disease Mother   . Stroke Mother   . Cirrhosis Father   . Diabetes Brother     ALLERGIES:  is allergic to macrobid [nitrofurantoin monohyd macro].  MEDICATIONS:  Current Outpatient Medications  Medication Sig Dispense Refill  . acyclovir (ZOVIRAX) 400 MG tablet Take 400 mg by mouth 2 (two) times daily.    Marland Kitchen albuterol (PROVENTIL HFA;VENTOLIN HFA) 108 (90 Base) MCG/ACT inhaler Inhale 2 puffs into the lungs every 6 (six) hours as needed for wheezing or shortness of breath. 1 Inhaler 2  . buPROPion (WELLBUTRIN XL) 150 MG 24 hr tablet Take 300 mg by mouth daily at 12 noon.     . clonazePAM (KLONOPIN) 0.5 MG tablet Take 0.5 mg by mouth 2 (two) times daily.     Marland Kitchen diltiazem (CARDIZEM CD) 180 MG 24 hr capsule  Take 1 capsule (180 mg total) by mouth daily. 30 capsule 0  . IDHIFA 100 MG TABS Take 100 mg by mouth daily.    . ondansetron (ZOFRAN-ODT) 4 MG disintegrating tablet Take 4 mg by mouth every 8 (eight) hours as needed for nausea or vomiting.    . sertraline (ZOLOFT) 100 MG tablet Take 100 mg by mouth daily at 12 noon.     . umeclidinium-vilanterol (ANORO ELLIPTA) 62.5-25 MCG/INH AEPB Inhale 1 puff into the lungs as needed.    . prochlorperazine (COMPAZINE) 10 MG tablet Take 1 tablet (10 mg total) by mouth every 6 (six) hours as needed (Nausea or vomiting). (Patient not taking: Reported on 09/24/2017) 30 tablet 1   No current facility-administered medications for this visit.      PHYSICAL EXAMINATION: ECOG PERFORMANCE STATUS: 1 - Symptomatic but completely ambulatory  Vitals:   09/26/17 0915  BP: Marland Kitchen)  164/83  Pulse: 76  Resp: 20  Temp: 97.9 F (36.6 C)    GENERAL: Well-nourished well-developed; Alert, no distress and comfortable. Obese. Accompanied by her family members. EYES: Positive for pallor. Drooping right eye (chronic) OROPHARYNX: no thrush or ulceration NECK: supple, no masses felt LYMPH:  no palpable lymphadenopathy in the cervical, axillary or inguinal regions LUNGS: Decreased breath sounds to auscultation and  No wheeze or crackles HEART/CVS: regular rate & rhythm and no murmurs; No lower extremity edema ABDOMEN: abdomen soft, non-tender and normal bowel sounds; possible splenomegaly. Musculoskeletal: no cyanosis of digits and no clubbing. Does not use aids for ambulation.  PSYCH: alert & oriented x 3 with fluent speech NEURO: no focal motor/sensory deficits SKIN:  No skin rash/nodules. Bruising on both hands.    LABORATORY DATA:  I have reviewed the data as listed Lab Results  Component Value Date   WBC 2.5 (L) 09/26/2017   HGB 8.5 (L) 09/26/2017   HCT 25.4 (L) 09/26/2017   MCV 103.7 (H) 09/26/2017   PLT 17 (LL) 09/26/2017   Recent Labs    07/17/17 1229   08/07/17 0919 08/11/17 0948 08/13/17 0838 08/25/17 0938 09/26/17 0900  NA 137   < > 140 141 142 140 141  K 3.6   < > 4.1 4.0 4.3 4.3 3.7  CL 103   < > 105 104 108 105 105  CO2 24   < > _0 GLUCOSE 114*   < > 131* 132* 142* 136* 129*  BUN 16   < > _1 24*  CREATININE 1.37*   < > 1.27* 1.23* 1.03* 1.28* 1.59*  CALCIUM 9.2   < > 9.2 9.1 8.9 9.0 9.0  GFRNONAA 37*   < > 40* 42* 51* 40* 30*  GFRAA 43*   < > 46* 48* 60* 46* 35*  PROT 8.0   < > 7.5 7.4  --  6.9  --   ALBUMIN 3.6   < > 3.8 3.8  --  3.8  --   AST 29   < > 19 20  --  24  --   ALT 19   < > 16 15  --  27  --   ALKPHOS 77   < > 81 68  --  62  --   BILITOT 1.2   < > 1.2 1.1  --  1.1  --   BILIDIR 0.1  --   --   --   --   --   --   IBILI 1.1*  --   --   --   --   --   --    < > = values in this interval not displayed.    RADIOGRAPHIC STUDIES: I have personally reviewed the radiological images as listed and agreed with the findings in the report.  IMPRESSION: 1. Mid and lower lung zone peribronchovascular ground-glass, nodularity and consolidation, likely due to an infectious bronchiolitis/bronchopneumonia. 2. Upper abdominal adenopathy, as on 07/17/2017. 3. Aortic atherosclerosis (ICD10-170.0). Moderate coronary artery calcification.  Electronically Signed   By: Lorin Picket M.D.   On: 08/25/2017 11:57    ASSESSMENT & PLAN:  Acute myeloid leukemia ([place]) AML - progressed from MDS- 12 cycles azacitadine; progressed to AML w/ 38% blasts- 05/2017. Repeat BMBx 11/12 at [facility] shows increased in blasts 35% to >60% per report. Currently on enasidenib (started 06/28/17). Was on allopurinol for prophylaxis for Tumor Lysis Syndrome. Discussed with Dr. Rogue Bussing  who agreed that in light of kidney function (see dis below) and low risk for TLS, can stop allopurinol and no need to switch to Uloric. Continue enasidenib 157m daily. Will check labs next week. Followup with [facility] in 3 weeks.   Severe  anemia/thrombocytopenia- secondary to AML. Hemoglobin 8.5. Platelets 17. Hold transfusion today. Will recheck cbc in 3 days then again in 4 days to evaluate for transfusion.   AKI/CKD - stage III. Cr improved (Cr 1.59) compared to [facility] (1.83--> 1.62 after 1L NaCl) visit last week. Managed by Dr. KJuleen China Encouraged oral hydration, keeping blood sugars and blood pressure within goal. Will check CMP and UA in 1 week.   Bronchitis/Bronchopneumonia- s/p levaquin and prednisone. Symptoms have resolved.   Port placement- Ok to proceed with scheduling for port placement. Patient will need transfusion prior to procedure.       LVerlon Au NP 09/26/2017 8:48 AM

## 2017-09-26 NOTE — Assessment & Plan Note (Signed)
AML - progressed from MDS- 12 cycles azacitadine; progressed to AML w/ 38% blasts- 05/2017. Repeat BMBx 11/12 at [facility] shows increased in blasts 35% to >60% per report. Currently on enasidenib (started 06/28/17). Was on allopurinol for prophylaxis for Tumor Lysis Syndrome. Discussed with Dr. Rogue Bussing who agreed that in light of kidney function (see dis below) and low risk for TLS, can stop allopurinol and no need to switch to Uloric. Continue enasidenib 100mg  daily. Will check labs next week. Followup with [facility] in 3 weeks.   Severe anemia/thrombocytopenia- secondary to AML. Hemoglobin 8.5. Platelets 17. Hold transfusion today. Will recheck cbc in 3 days then again in 4 days to evaluate for transfusion.   AKI/CKD - stage III. Cr improved (Cr 1.59) compared to [facility] (1.83--> 1.62 after 1L NaCl) visit last week. Managed by Dr. Juleen China. Encouraged oral hydration, keeping blood sugars and blood pressure within goal. Will check CMP and UA in 1 week.   Bronchitis/Bronchopneumonia- s/p levaquin and prednisone. Symptoms have resolved.   Port placement- Ok to proceed with scheduling for port placement. Patient will need transfusion prior to procedure.

## 2017-09-26 NOTE — Progress Notes (Signed)
No treatment today per Dr. Baxter Hire

## 2017-09-26 NOTE — Progress Notes (Signed)
Per Jerene Pitch, CMA- received phone call from [place] in lab. critical plt count is 17. Per Dr. Rogue Bussing. Pt does not need any plt infusion today.

## 2017-09-26 NOTE — Telephone Encounter (Signed)
Patient called in for lab results, results given per note Dr. Sanda Klein 09/25/17, patient verbalized understanding.

## 2017-09-29 ENCOUNTER — Inpatient Hospital Stay: Payer: Medicare Other

## 2017-09-29 DIAGNOSIS — C9202 Acute myeloblastic leukemia, in relapse: Secondary | ICD-10-CM | POA: Diagnosis not present

## 2017-09-29 DIAGNOSIS — D4620 Refractory anemia with excess of blasts, unspecified: Secondary | ICD-10-CM | POA: Diagnosis not present

## 2017-09-29 DIAGNOSIS — D6959 Other secondary thrombocytopenia: Secondary | ICD-10-CM | POA: Diagnosis not present

## 2017-09-29 DIAGNOSIS — I129 Hypertensive chronic kidney disease with stage 1 through stage 4 chronic kidney disease, or unspecified chronic kidney disease: Secondary | ICD-10-CM | POA: Diagnosis not present

## 2017-09-29 DIAGNOSIS — D630 Anemia in neoplastic disease: Secondary | ICD-10-CM | POA: Diagnosis not present

## 2017-09-29 DIAGNOSIS — N183 Chronic kidney disease, stage 3 (moderate): Secondary | ICD-10-CM | POA: Diagnosis not present

## 2017-09-29 DIAGNOSIS — Z87891 Personal history of nicotine dependence: Secondary | ICD-10-CM | POA: Diagnosis not present

## 2017-09-29 DIAGNOSIS — C9200 Acute myeloblastic leukemia, not having achieved remission: Secondary | ICD-10-CM

## 2017-09-29 LAB — CBC WITH DIFFERENTIAL/PLATELET
BASOS ABS: 0 10*3/uL (ref 0–0.1)
BASOS PCT: 1 %
Band Neutrophils: 7 %
EOS PCT: 2 %
Eosinophils Absolute: 0.1 10*3/uL (ref 0–0.7)
HEMATOCRIT: 25.7 % — AB (ref 35.0–47.0)
HEMOGLOBIN: 8.6 g/dL — AB (ref 12.0–16.0)
LYMPHS ABS: 0.8 10*3/uL — AB (ref 1.0–3.6)
Lymphocytes Relative: 32 %
MCH: 35.2 pg — ABNORMAL HIGH (ref 26.0–34.0)
MCHC: 33.7 g/dL (ref 32.0–36.0)
MCV: 104.6 fL — ABNORMAL HIGH (ref 80.0–100.0)
MONOS PCT: 8 %
Monocytes Absolute: 0.2 10*3/uL (ref 0.2–0.9)
NEUTROS PCT: 50 %
Neutro Abs: 1.4 10*3/uL (ref 1.4–6.5)
Platelets: 16 10*3/uL — CL (ref 150–400)
RBC: 2.46 MIL/uL — AB (ref 3.80–5.20)
RDW: 21.8 % — ABNORMAL HIGH (ref 11.5–14.5)
WBC: 2.5 10*3/uL — AB (ref 3.6–11.0)

## 2017-09-29 LAB — SAMPLE TO BLOOD BANK

## 2017-09-29 NOTE — Progress Notes (Signed)
Per [name] per Dr. Rogue Bussing, no blood or platelet transfusions today.  Pt to return as scheduled

## 2017-09-30 ENCOUNTER — Telehealth: Payer: Self-pay

## 2017-09-30 NOTE — Telephone Encounter (Signed)
I received a message from Marcie Bal stating that patient could potentially have port placed on Thursday morning. Patient will need an 8:00 lab appt and a possible platelet infusion tomorrow. I have spoke with Vicky in infusion who states that they are willing to accommodate patient for platelets if need be. I have left a message with patient to be ecpecting a phone call from schedulers to get these appt's set up for tomorrow.  Colette, would you mind trying to reach patient to get these appointments scheduled?

## 2017-10-01 ENCOUNTER — Inpatient Hospital Stay: Payer: Medicare Other

## 2017-10-01 ENCOUNTER — Other Ambulatory Visit: Payer: Self-pay | Admitting: *Deleted

## 2017-10-01 DIAGNOSIS — D46Z Other myelodysplastic syndromes: Secondary | ICD-10-CM

## 2017-10-01 DIAGNOSIS — D4620 Refractory anemia with excess of blasts, unspecified: Secondary | ICD-10-CM | POA: Diagnosis not present

## 2017-10-01 DIAGNOSIS — D6959 Other secondary thrombocytopenia: Secondary | ICD-10-CM | POA: Diagnosis not present

## 2017-10-01 DIAGNOSIS — D630 Anemia in neoplastic disease: Secondary | ICD-10-CM | POA: Diagnosis not present

## 2017-10-01 DIAGNOSIS — Z87891 Personal history of nicotine dependence: Secondary | ICD-10-CM | POA: Diagnosis not present

## 2017-10-01 DIAGNOSIS — I129 Hypertensive chronic kidney disease with stage 1 through stage 4 chronic kidney disease, or unspecified chronic kidney disease: Secondary | ICD-10-CM | POA: Diagnosis not present

## 2017-10-01 DIAGNOSIS — N183 Chronic kidney disease, stage 3 (moderate): Secondary | ICD-10-CM | POA: Diagnosis not present

## 2017-10-01 DIAGNOSIS — C9200 Acute myeloblastic leukemia, not having achieved remission: Secondary | ICD-10-CM

## 2017-10-01 DIAGNOSIS — C9202 Acute myeloblastic leukemia, in relapse: Secondary | ICD-10-CM | POA: Diagnosis not present

## 2017-10-01 LAB — URINALYSIS, COMPLETE (UACMP) WITH MICROSCOPIC
BACTERIA UA: NONE SEEN
Bilirubin Urine: NEGATIVE
Glucose, UA: NEGATIVE mg/dL
Ketones, ur: NEGATIVE mg/dL
LEUKOCYTES UA: NEGATIVE
Nitrite: NEGATIVE
PROTEIN: 30 mg/dL — AB
Specific Gravity, Urine: 1.011 (ref 1.005–1.030)
pH: 5 (ref 5.0–8.0)

## 2017-10-01 LAB — CBC WITH DIFFERENTIAL/PLATELET
BASOS ABS: 0 10*3/uL (ref 0–0.1)
Basophils Relative: 0 %
EOS ABS: 0 10*3/uL (ref 0–0.7)
Eosinophils Relative: 0 %
HCT: 24.7 % — ABNORMAL LOW (ref 35.0–47.0)
Hemoglobin: 8.3 g/dL — ABNORMAL LOW (ref 12.0–16.0)
LYMPHS PCT: 35 %
Lymphs Abs: 0.8 10*3/uL — ABNORMAL LOW (ref 1.0–3.6)
MCH: 35.1 pg — AB (ref 26.0–34.0)
MCHC: 33.6 g/dL (ref 32.0–36.0)
MCV: 104.5 fL — ABNORMAL HIGH (ref 80.0–100.0)
Monocytes Absolute: 0.1 10*3/uL — ABNORMAL LOW (ref 0.2–0.9)
Monocytes Relative: 4 %
NEUTROS PCT: 61 %
Neutro Abs: 1.3 10*3/uL — ABNORMAL LOW (ref 1.4–6.5)
PLATELETS: 16 10*3/uL — AB (ref 150–400)
RBC: 2.37 MIL/uL — ABNORMAL LOW (ref 3.80–5.20)
RDW: 21.2 % — ABNORMAL HIGH (ref 11.5–14.5)
WBC: 2.2 10*3/uL — ABNORMAL LOW (ref 3.6–11.0)

## 2017-10-01 LAB — COMPREHENSIVE METABOLIC PANEL
ALT: 13 U/L — AB (ref 14–54)
AST: 16 U/L (ref 15–41)
Albumin: 3.9 g/dL (ref 3.5–5.0)
Alkaline Phosphatase: 63 U/L (ref 38–126)
Anion gap: 6 (ref 5–15)
BUN: 24 mg/dL — ABNORMAL HIGH (ref 6–20)
CHLORIDE: 106 mmol/L (ref 101–111)
CO2: 27 mmol/L (ref 22–32)
CREATININE: 1.24 mg/dL — AB (ref 0.44–1.00)
Calcium: 8.8 mg/dL — ABNORMAL LOW (ref 8.9–10.3)
GFR calc non Af Amer: 41 mL/min — ABNORMAL LOW (ref >60)
GFR, EST AFRICAN AMERICAN: 48 mL/min — AB (ref >60)
Glucose, Bld: 120 mg/dL — ABNORMAL HIGH (ref 65–99)
Potassium: 4.1 mmol/L (ref 3.5–5.1)
SODIUM: 139 mmol/L (ref 135–145)
Total Bilirubin: 1.1 mg/dL (ref 0.3–1.2)
Total Protein: 6.7 g/dL (ref 6.5–8.1)

## 2017-10-01 LAB — SAMPLE TO BLOOD BANK

## 2017-10-01 MED ORDER — DIPHENHYDRAMINE HCL 25 MG PO CAPS
25.0000 mg | ORAL_CAPSULE | Freq: Once | ORAL | Status: AC
  Administered 2017-10-01: 25 mg via ORAL
  Filled 2017-10-01: qty 1

## 2017-10-01 MED ORDER — ACETAMINOPHEN 325 MG PO TABS
650.0000 mg | ORAL_TABLET | Freq: Once | ORAL | Status: AC
  Administered 2017-10-01: 650 mg via ORAL
  Filled 2017-10-01: qty 2

## 2017-10-01 MED ORDER — SODIUM CHLORIDE 0.9 % IV SOLN
250.0000 mL | Freq: Once | INTRAVENOUS | Status: AC
  Administered 2017-10-01: 250 mL via INTRAVENOUS
  Filled 2017-10-01: qty 250

## 2017-10-02 LAB — PREPARE PLATELET PHERESIS: UNIT DIVISION: 0

## 2017-10-02 LAB — BPAM PLATELET PHERESIS
BLOOD PRODUCT EXPIRATION DATE: 201901252359
ISSUE DATE / TIME: 201901231131
Unit Type and Rh: 6200

## 2017-10-03 ENCOUNTER — Inpatient Hospital Stay (HOSPITAL_BASED_OUTPATIENT_CLINIC_OR_DEPARTMENT_OTHER): Payer: Medicare Other | Admitting: Internal Medicine

## 2017-10-03 ENCOUNTER — Inpatient Hospital Stay: Payer: Medicare Other

## 2017-10-03 ENCOUNTER — Other Ambulatory Visit: Payer: Self-pay

## 2017-10-03 VITALS — BP 151/77 | HR 82 | Temp 97.6°F | Resp 20 | Wt 224.0 lb

## 2017-10-03 DIAGNOSIS — C9200 Acute myeloblastic leukemia, not having achieved remission: Secondary | ICD-10-CM

## 2017-10-03 DIAGNOSIS — I129 Hypertensive chronic kidney disease with stage 1 through stage 4 chronic kidney disease, or unspecified chronic kidney disease: Secondary | ICD-10-CM | POA: Diagnosis not present

## 2017-10-03 DIAGNOSIS — D4622 Refractory anemia with excess of blasts 2: Secondary | ICD-10-CM

## 2017-10-03 DIAGNOSIS — N183 Chronic kidney disease, stage 3 (moderate): Secondary | ICD-10-CM

## 2017-10-03 DIAGNOSIS — C9202 Acute myeloblastic leukemia, in relapse: Secondary | ICD-10-CM | POA: Diagnosis not present

## 2017-10-03 DIAGNOSIS — D4620 Refractory anemia with excess of blasts, unspecified: Secondary | ICD-10-CM | POA: Diagnosis not present

## 2017-10-03 DIAGNOSIS — J40 Bronchitis, not specified as acute or chronic: Secondary | ICD-10-CM | POA: Diagnosis not present

## 2017-10-03 DIAGNOSIS — D630 Anemia in neoplastic disease: Secondary | ICD-10-CM

## 2017-10-03 DIAGNOSIS — D6959 Other secondary thrombocytopenia: Secondary | ICD-10-CM | POA: Diagnosis not present

## 2017-10-03 DIAGNOSIS — Z87891 Personal history of nicotine dependence: Secondary | ICD-10-CM | POA: Diagnosis not present

## 2017-10-03 LAB — CBC WITH DIFFERENTIAL/PLATELET
BAND NEUTROPHILS: 0 %
BLASTS: 0 %
Basophils Absolute: 0 10*3/uL (ref 0–0.1)
Basophils Relative: 0 %
EOS ABS: 0 10*3/uL (ref 0–0.7)
EOS PCT: 2 %
HCT: 24.7 % — ABNORMAL LOW (ref 35.0–47.0)
Hemoglobin: 8.2 g/dL — ABNORMAL LOW (ref 12.0–16.0)
LYMPHS PCT: 34 %
Lymphs Abs: 0.6 10*3/uL — ABNORMAL LOW (ref 1.0–3.6)
MCH: 35 pg — AB (ref 26.0–34.0)
MCHC: 33.4 g/dL (ref 32.0–36.0)
MCV: 104.8 fL — AB (ref 80.0–100.0)
MONOS PCT: 1 %
Metamyelocytes Relative: 0 %
Monocytes Absolute: 0 10*3/uL — ABNORMAL LOW (ref 0.2–0.9)
Myelocytes: 0 %
NEUTROS ABS: 1.3 10*3/uL — AB (ref 1.4–6.5)
Neutrophils Relative %: 63 %
OTHER: 0 %
PLATELETS: 31 10*3/uL — AB (ref 150–400)
Promyelocytes Absolute: 0 %
RBC: 2.35 MIL/uL — AB (ref 3.80–5.20)
RDW: 21 % — AB (ref 11.5–14.5)
WBC: 1.9 10*3/uL — ABNORMAL LOW (ref 3.6–11.0)
nRBC: 0 /100 WBC

## 2017-10-03 LAB — SAMPLE TO BLOOD BANK

## 2017-10-03 MED ORDER — LEVOFLOXACIN 500 MG PO TABS: 500.0000 mg | ORAL_TABLET | Freq: Every day | ORAL | 0 refills | Status: DC

## 2017-10-03 NOTE — Assessment & Plan Note (Addendum)
AML - progressed from MDS- 12 cycles azacitadine; progressed to AML w/ 38% blasts- 05/2017. Repeat BMBx 11/12 at [facility] shows increased in blasts 35% to >60% per report. Currently on enasidenib (started 06/28/17).  #Long discussion with the patient/husband regarding possible benefit from current therapy.  However she does not seem to have complete response from the current treatment-as she is still requiring intermittent platelet and PRBC transfusions.  Unfortunately limited options.   #Platelets 16; hemoglobin 8.3.  Hold any transfusion today.  Likely need transfusion prior to port placement on January 31.  # AKI/CKD - stage III. Cr improved (Cr 1.59) compared to [facility] (1.83-]-  improved 1.24 on 1/23.  #Question bronchitis/however ANC-1300 today.  Recommend Levaquin.  # awaiting port placement- plan on 1/31; plan platelets- 1/30.   Follow up in 3 weeks; weekly labs.

## 2017-10-03 NOTE — Progress Notes (Signed)
Patient presents to clinic today for f/u for AML. She reports cough/wheeze. Pt voices concerns that she may be "getting an upper respiratory infection.." pt due for port placement next week and is concerned that the port would be post poned if her upper respiratory symptoms worsen.

## 2017-10-03 NOTE — Progress Notes (Signed)
[place] NOTE  Patient Care Team: Lada, Satira Anis, [name] Cammie Sickle, MD as Consulting Physician (Internal Medicine) Gloriann Loan, MD as Attending Physician Erby Pian, MD as Referring Physician (Specialist) Yolonda Kida, MD as Consulting Physician (Cardiology) Albertine Patricia, [place] as Attending Physician (Podiatry) Lavonia Dana, MD as Consulting Physician (Nephrology)  CHIEF COMPLAINTS/PURPOSE OF CONSULTATION:   Oncology History   #JUNE 2017- Severe neutropenia/ Anemia ~hb 9/platlets- 85-100 AUG 2017- REFRACTORY ANEMIA with EXCESS BLASTS [14% blasts- BMBx]; cytogenetics/FISH-N [SNP micorarray- not done]   # AUG 21st 2017-  START AZA 60m/m2 day- 1-7 q 28 days x4 cycles; DEC 6th- BMBx- <5% blasts; hypercellular with dysplasia.   # SEP 20th 2018- ACUTE MYELOID LEUKEMIA [38% blasts on BMBx]; IDH-MUTATION PRESENT; low FLT-3; OCT 18th 2018- ENASIDENIB [Dr.Foster; [facility]]   # CKD stage III  ------------------------------------------------------   MOLECULAR TESTING: NGS sent 06/2017 -FLT3-ITD <0.05 mutational burden -IDH2 -DNMT3A --treatment options include FLT3 inhibition and IDH2 inhibition; given low level FGUY4-IHKallelic ratio [admittedly in peripheral blood, not bone marrow]- started on ENASIDENIB     MDS (myelodysplastic syndrome), high grade (HLodi   04/23/2016 Initial Diagnosis    MDS (myelodysplastic syndrome), high grade (HCC)       Acute myeloid leukemia not having achieved remission ([place]  At    HISTORY OF PRESENTING ILLNESS:  Teresa KISAMORE89y.o.  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  Patient complains of runny nose coughing and phlegm yellow colored over the last few days.  No fevers.  No nosebleeds or gum bleeding. No nausea no vomiting or diarrhea.  Denies any significant weight gain  or weight loss.   ROS: A complete 10 point review of system is done which is negative except mentioned above in history of present illness.  MEDICAL HISTORY:  Past Medical History:  Diagnosis Date  . Abdominal wall mass   . Anginal pain (HClover[place]   . Anxiety   . Arthritis   . Calculus of kidney   . Cystitis   . Depression   . Diabetes mellitus without complication (HCC)    elevated A1c  . Dyspnea on exertion   . Elevated serum creatinine   . Fibrocystic breast disease   . GERD (gastroesophageal reflux disease)   . Hearing loss   . Heart murmur   . HTN (hypertension)   . Hyperlipidemia   . MDS (myelodysplastic syndrome), high grade (HEstero 04/23/2016  . Microscopic hematuria   . Mouth sores   . Mucositis due to chemotherapy   . Obesity   . Obesity, morbid (H[place] 09/24/2017   BMI > 35 with type 2 diabetes  . Risk for falls   . Sleep apnea   . Thrombocytopenia (HCom[place]   . Urinary frequency   . Urinary urgency     SURGICAL HISTORY: Past Surgical History:  Procedure Laterality Date  . ABDOMINAL HYSTERECTOMY    . APPENDECTOMY    . CARDIAC CATHETERIZATION     x2  . CHOLECYSTECTOMY    . COLONOSCOPY N/A 02/24/2015   Procedure: COLONOSCOPY;  Surgeon: RManya Silvas MD;  [facility];  Service: Endoscopy;  Laterality: N/A;  . DIAGNOSTIC LAPAROSCOPY     Removal of benign abdominal tumor  . ESOPHAGOGASTRODUODENOSCOPY N/A 02/24/2015   Procedure: ESOPHAGOGASTRODUODENOSCOPY (EGD);  Surgeon: RManya Silvas MD;  [facility];  Service: Endoscopy;  Laterality: N/A;  . 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 No smoking/ no alcohol.  Social History   Socioeconomic History  . Marital status: Married    Spouse name: Not on file  . Number of children: Not on file  . Years of education: Not on file  . Highest education level: Not on file  Social Needs  . Financial resource strain: Not on file  . Food  insecurity - worry: Not on file  . Food insecurity - inability: Not on file  . Transportation needs - medical: Not on file  . Transportation needs - non-medical: Not on file  Occupational History  . Not on file  Tobacco Use  . Smoking status: Former Smoker    Types: Cigarettes    Last attempt to quit: 09/09/1988    Years since quitting: 29.0  . Smokeless tobacco: Never Used  . Tobacco comment: quit 25 years ago  Substance and Sexual Activity  . Alcohol use: No    Alcohol/week: 0.0 oz  . Drug use: No  . Sexual activity: Not Currently    Birth control/protection: Post-menopausal  Other Topics Concern  . Not on file  Social History Narrative  . Not on file    FAMILY HISTORY: no cancers in family.  Family History  Problem Relation Age of Onset  . Congestive Heart Failure Mother   . Diabetes Mother   . Coronary artery disease Mother   . Stroke Mother   . Cirrhosis Father   . Diabetes Brother     ALLERGIES:  is allergic to macrobid [nitrofurantoin monohyd macro].  MEDICATIONS:  Current Outpatient Medications  Medication Sig Dispense Refill  . acyclovir (ZOVIRAX) 400 MG tablet Take 400 mg by mouth 2 (two) times daily.    Marland Kitchen albuterol (PROVENTIL HFA;VENTOLIN HFA) 108 (90 Base) MCG/ACT inhaler Inhale 2 puffs into the lungs every 6 (six) hours as needed for wheezing or shortness of breath. 1 Inhaler 2  . buPROPion (WELLBUTRIN XL) 150 MG 24 hr tablet Take 300 mg by mouth daily at 12 noon.     . clonazePAM (KLONOPIN) 0.5 MG tablet Take 0.5 mg by mouth 2 (two) times daily.     Marland Kitchen diltiazem (CARDIZEM CD) 180 MG 24 hr capsule Take 1 capsule (180 mg total) by mouth daily. 30 capsule 0  . IDHIFA 100 MG TABS Take 100 mg by mouth daily.    Marland Kitchen levofloxacin (LEVAQUIN) 500 MG tablet Take 1 tablet (500 mg total) by mouth daily. 10 tablet 0  . ondansetron (ZOFRAN-ODT) 4 MG disintegrating tablet Take 4 mg by mouth every 8 (eight) hours as needed for nausea or vomiting.    . prochlorperazine  (COMPAZINE) 10 MG tablet Take 1 tablet (10 mg total) by mouth every 6 (six) hours as needed (Nausea or vomiting). (Patient not taking: Reported on 09/24/2017) 30 tablet 1  . sertraline (ZOLOFT) 100 MG tablet Take 100 mg by mouth daily at 12 noon.     . umeclidinium-vilanterol (ANORO ELLIPTA) 62.5-25 MCG/INH AEPB Inhale 1 puff into the lungs as needed.     No current facility-administered medications for this visit.       Marland Kitchen  PHYSICAL EXAMINATION: ECOG PERFORMANCE STATUS: 1 - Symptomatic but completely ambulatory  Vitals:   10/03/17 0856  BP: (!) 151/77  Pulse: 82  Resp: 20  Temp: 97.6 F (36.4 C)   Filed Weights   10/03/17 0856  Weight: 224 lb (101.6  kg)    GENERAL: Well-nourished well-developed; Alert, no distress and comfortable.   Obese. Accompanied by her husband. She is walking by herself. EYES: Positive for pallor. OROPHARYNX: no thrush or ulceration;  NECK: supple, no masses felt LYMPH:  no palpable lymphadenopathy in the cervical, axillary or inguinal regions LUNGS: Decreased breath sounds to auscultation and  No wheeze or crackles HEART/CVS: regular rate & rhythm and no murmurs; No lower extremity edema ABDOMEN: abdomen soft, non-tender and normal bowel sounds; possible splenomegaly. Musculoskeletal:no cyanosis of digits and no clubbing  PSYCH: alert & oriented x 3 with fluent speech NEURO: no focal motor/sensory deficits SKIN:  No skin rash/nodules.  LABORATORY DATA:  I have reviewed the data as listed Lab Results  Component Value Date   WBC 1.9 (L) 10/03/2017   HGB 8.2 (L) 10/03/2017   HCT 24.7 (L) 10/03/2017   MCV 104.8 (H) 10/03/2017   PLT 31 (L) 10/03/2017   Recent Labs    07/17/17 1229  08/11/17 0948  08/25/17 0938 09/26/17 0900 10/01/17 0811  NA 137   < > 141   < > 140 141 139  K 3.6   < > 4.0   < > 4.3 3.7 4.1  CL 103   < > 104   < > 105 105 106  CO2 24   < > 24   < > _0 GLUCOSE 114*   < > 132*   < > 136* 129* 120*  BUN 16   < > 16   <  > 17 24* 24*  CREATININE 1.37*   < > 1.23*   < > 1.28* 1.59* 1.24*  CALCIUM 9.2   < > 9.1   < > 9.0 9.0 8.8*  GFRNONAA 37*   < > 42*   < > 40* 30* 41*  GFRAA 43*   < > 48*   < > 46* 35* 48*  PROT 8.0   < > 7.4  --  6.9  --  6.7  ALBUMIN 3.6   < > 3.8  --  3.8  --  3.9  AST 29   < > 20  --  24  --  16  ALT 19   < > 15  --  27  --  13*  ALKPHOS 77   < > 68  --  62  --  63  BILITOT 1.2   < > 1.1  --  1.1  --  1.1  BILIDIR 0.1  --   --   --   --   --   --   IBILI 1.1*  --   --   --   --   --   --    < > = values in this interval not displayed.    RADIOGRAPHIC STUDIES: I have personally reviewed the radiological images as listed and agreed with the findings in the report. No results found. IMPRESSION: 1. Mid and lower lung zone peribronchovascular ground-glass, nodularity and consolidation, likely due to an infectious bronchiolitis/bronchopneumonia. 2. Upper abdominal adenopathy, as on 07/17/2017. 3. Aortic atherosclerosis (ICD10-170.0). Moderate coronary artery calcification.   Electronically Signed   By: Lorin Picket M.D.   On: 08/25/2017 11:57 ASSESSMENT & PLAN:   Acute myeloid leukemia not having achieved remission ([place]) AML - progressed from MDS- 12 cycles azacitadine; progressed to AML w/ 38% blasts- 05/2017. Repeat BMBx 11/12 at [facility] shows increased in blasts 35% to >60% per report. Currently on enasidenib (started 06/28/17).  #  Long discussion with the patient/husband regarding possible benefit from current therapy.  However she does not seem to have complete response from the current treatment-as she is still requiring intermittent platelet and PRBC transfusions.  Unfortunately limited options.   #Platelets 16; hemoglobin 8.3.  Hold any transfusion today.  Likely need transfusion prior to port placement on January 31.  # AKI/CKD - stage III. Cr improved (Cr 1.59) compared to [facility] (1.83-]-  improved 1.24 on 1/23.  #Question bronchitis/however ANC-1300 today.  Recommend  Levaquin.  # awaiting port placement- plan on 1/31; plan platelets- 1/30.   Follow up in 3 weeks; weekly labs.      Cammie Sickle, MD 10/06/2017 4:57 PM

## 2017-10-08 ENCOUNTER — Other Ambulatory Visit: Payer: Self-pay | Admitting: Internal Medicine

## 2017-10-08 ENCOUNTER — Other Ambulatory Visit: Payer: Self-pay | Admitting: Radiology

## 2017-10-08 ENCOUNTER — Inpatient Hospital Stay: Payer: Medicare Other

## 2017-10-08 ENCOUNTER — Other Ambulatory Visit: Payer: Self-pay | Admitting: *Deleted

## 2017-10-08 DIAGNOSIS — N183 Chronic kidney disease, stage 3 (moderate): Secondary | ICD-10-CM | POA: Diagnosis not present

## 2017-10-08 DIAGNOSIS — D4620 Refractory anemia with excess of blasts, unspecified: Secondary | ICD-10-CM | POA: Diagnosis not present

## 2017-10-08 DIAGNOSIS — Z87891 Personal history of nicotine dependence: Secondary | ICD-10-CM | POA: Diagnosis not present

## 2017-10-08 DIAGNOSIS — I129 Hypertensive chronic kidney disease with stage 1 through stage 4 chronic kidney disease, or unspecified chronic kidney disease: Secondary | ICD-10-CM | POA: Diagnosis not present

## 2017-10-08 DIAGNOSIS — C9200 Acute myeloblastic leukemia, not having achieved remission: Secondary | ICD-10-CM

## 2017-10-08 DIAGNOSIS — C9202 Acute myeloblastic leukemia, in relapse: Secondary | ICD-10-CM | POA: Diagnosis not present

## 2017-10-08 DIAGNOSIS — D6959 Other secondary thrombocytopenia: Secondary | ICD-10-CM | POA: Diagnosis not present

## 2017-10-08 DIAGNOSIS — D630 Anemia in neoplastic disease: Secondary | ICD-10-CM | POA: Diagnosis not present

## 2017-10-08 LAB — CBC WITH DIFFERENTIAL/PLATELET
Band Neutrophils: 3 %
Basophils Absolute: 0 10*3/uL (ref 0–0.1)
Basophils Relative: 1 %
Blasts: 0 %
Eosinophils Absolute: 0 10*3/uL (ref 0–0.7)
Eosinophils Relative: 0 %
HCT: 25.3 % — ABNORMAL LOW (ref 35.0–47.0)
Hemoglobin: 8.5 g/dL — ABNORMAL LOW (ref 12.0–16.0)
Lymphocytes Relative: 30 %
Lymphs Abs: 0.6 10*3/uL — ABNORMAL LOW (ref 1.0–3.6)
MCH: 35.5 pg — ABNORMAL HIGH (ref 26.0–34.0)
MCHC: 33.6 g/dL (ref 32.0–36.0)
MCV: 105.5 fL — ABNORMAL HIGH (ref 80.0–100.0)
Metamyelocytes Relative: 0 %
Monocytes Absolute: 0.1 10*3/uL — ABNORMAL LOW (ref 0.2–0.9)
Monocytes Relative: 4 %
Myelocytes: 0 %
Neutro Abs: 1.3 10*3/uL — ABNORMAL LOW (ref 1.4–6.5)
Neutrophils Relative %: 62 %
Other: 0 %
Platelets: 17 10*3/uL — CL (ref 150–400)
Promyelocytes Absolute: 0 %
RBC: 2.4 MIL/uL — ABNORMAL LOW (ref 3.80–5.20)
RDW: 20.1 % — ABNORMAL HIGH (ref 11.5–14.5)
Smear Review: DECREASED
WBC: 2 10*3/uL — ABNORMAL LOW (ref 3.6–11.0)
nRBC: 1 /100{WBCs} — ABNORMAL HIGH

## 2017-10-08 LAB — BASIC METABOLIC PANEL WITH GFR
Anion gap: 11 (ref 5–15)
BUN: 26 mg/dL — ABNORMAL HIGH (ref 6–20)
CO2: 25 mmol/L (ref 22–32)
Calcium: 8.6 mg/dL — ABNORMAL LOW (ref 8.9–10.3)
Chloride: 105 mmol/L (ref 101–111)
Creatinine, Ser: 1.16 mg/dL — ABNORMAL HIGH (ref 0.44–1.00)
GFR calc Af Amer: 52 mL/min — ABNORMAL LOW
GFR calc non Af Amer: 45 mL/min — ABNORMAL LOW
Glucose, Bld: 122 mg/dL — ABNORMAL HIGH (ref 65–99)
Potassium: 4 mmol/L (ref 3.5–5.1)
Sodium: 141 mmol/L (ref 135–145)

## 2017-10-08 LAB — SAMPLE TO BLOOD BANK

## 2017-10-08 MED ORDER — SODIUM CHLORIDE 0.9 % IV SOLN
250.0000 mL | Freq: Once | INTRAVENOUS | Status: AC
  Administered 2017-10-08: 250 mL via INTRAVENOUS
  Filled 2017-10-08: qty 250

## 2017-10-08 MED ORDER — DIPHENHYDRAMINE HCL 25 MG PO CAPS
25.0000 mg | ORAL_CAPSULE | Freq: Once | ORAL | Status: AC
  Administered 2017-10-08: 25 mg via ORAL
  Filled 2017-10-08: qty 1

## 2017-10-08 MED ORDER — ACETAMINOPHEN 325 MG PO TABS
650.0000 mg | ORAL_TABLET | Freq: Once | ORAL | Status: AC
  Administered 2017-10-08: 650 mg via ORAL
  Filled 2017-10-08: qty 2

## 2017-10-09 ENCOUNTER — Ambulatory Visit
Admission: RE | Admit: 2017-10-09 | Discharge: 2017-10-09 | Disposition: A | Discharge status: Home / Self Care | Payer: Medicare Other | Source: Ambulatory Visit | Attending: Internal Medicine | Admitting: Internal Medicine

## 2017-10-09 DIAGNOSIS — Z452 Encounter for adjustment and management of vascular access device: Secondary | ICD-10-CM

## 2017-10-09 DIAGNOSIS — Z5111 Encounter for antineoplastic chemotherapy: Secondary | ICD-10-CM | POA: Diagnosis not present

## 2017-10-09 DIAGNOSIS — C9200 Acute myeloblastic leukemia, not having achieved remission: Secondary | ICD-10-CM | POA: Insufficient documentation

## 2017-10-09 DIAGNOSIS — C9590 Leukemia, unspecified not having achieved remission: Secondary | ICD-10-CM | POA: Diagnosis not present

## 2017-10-09 HISTORY — DX: Pneumonia, unspecified organism: J18.9

## 2017-10-09 HISTORY — PX: IR IMAGING GUIDED PORT INSERTION: IMG5740

## 2017-10-09 LAB — CBC
HCT: 28.2 % — ABNORMAL LOW (ref 35.0–47.0)
Hemoglobin: 9.1 g/dL — ABNORMAL LOW (ref 12.0–16.0)
MCH: 34.3 pg — ABNORMAL HIGH (ref 26.0–34.0)
MCHC: 32.1 g/dL (ref 32.0–36.0)
MCV: 106.9 fL — ABNORMAL HIGH (ref 80.0–100.0)
Platelets: 32 10*3/uL — ABNORMAL LOW (ref 150–440)
RBC: 2.64 MIL/uL — ABNORMAL LOW (ref 3.80–5.20)
RDW: 19.7 % — ABNORMAL HIGH (ref 11.5–14.5)
WBC: 2 10*3/uL — ABNORMAL LOW (ref 3.6–11.0)

## 2017-10-09 LAB — BASIC METABOLIC PANEL
ANION GAP: 10 (ref 5–15)
BUN: 22 mg/dL — ABNORMAL HIGH (ref 6–20)
CALCIUM: 9.3 mg/dL (ref 8.9–10.3)
CHLORIDE: 109 mmol/L (ref 101–111)
CO2: 25 mmol/L (ref 22–32)
Creatinine, Ser: 1.16 mg/dL — ABNORMAL HIGH (ref 0.44–1.00)
GFR calc non Af Amer: 45 mL/min — ABNORMAL LOW (ref >60)
GFR, EST AFRICAN AMERICAN: 52 mL/min — AB (ref >60)
Glucose, Bld: 110 mg/dL — ABNORMAL HIGH (ref 65–99)
POTASSIUM: 4.1 mmol/L (ref 3.5–5.1)
Sodium: 144 mmol/L (ref 135–145)

## 2017-10-09 LAB — PROTIME-INR
INR: 1.06
Prothrombin Time: 13.7 seconds (ref 11.4–15.2)

## 2017-10-09 LAB — BPAM PLATELET PHERESIS
Blood Product Expiration Date: 201901311037
ISSUE DATE / TIME: 201901301051
UNIT TYPE AND RH: 6200

## 2017-10-09 LAB — PREPARE PLATELET PHERESIS: UNIT DIVISION: 0

## 2017-10-09 MED ORDER — FENTANYL CITRATE (PF) 100 MCG/2ML IJ SOLN
INTRAMUSCULAR | Status: AC
  Filled 2017-10-09: qty 2

## 2017-10-09 MED ORDER — FENTANYL CITRATE (PF) 100 MCG/2ML IJ SOLN
INTRAMUSCULAR | Status: AC
  Filled 2017-10-09: qty 4

## 2017-10-09 MED ORDER — HEPARIN SOD (PORK) LOCK FLUSH 100 UNIT/ML IV SOLN
INTRAVENOUS | Status: AC
  Filled 2017-10-09: qty 5

## 2017-10-09 MED ORDER — LIDOCAINE-EPINEPHRINE (PF) 1 %-1:200000 IJ SOLN
INTRAMUSCULAR | Status: AC
  Filled 2017-10-09: qty 30

## 2017-10-09 MED ORDER — MIDAZOLAM HCL 5 MG/5ML IJ SOLN
INTRAMUSCULAR | Status: AC
  Filled 2017-10-09: qty 5

## 2017-10-09 MED ORDER — FENTANYL CITRATE (PF) 100 MCG/2ML IJ SOLN
INTRAMUSCULAR | Status: AC | PRN
  Administered 2017-10-09: 50 ug via INTRAVENOUS

## 2017-10-09 MED ORDER — SODIUM CHLORIDE 0.9 % IV SOLN
INTRAVENOUS | Status: DC
  Administered 2017-10-09: 14:00:00 via INTRAVENOUS

## 2017-10-09 MED ORDER — CEFAZOLIN SODIUM-DEXTROSE 2-4 GM/100ML-% IV SOLN
2.0000 g | INTRAVENOUS | Status: AC
  Administered 2017-10-09: 2 g via INTRAVENOUS

## 2017-10-09 MED ORDER — MIDAZOLAM HCL 5 MG/5ML IJ SOLN
INTRAMUSCULAR | Status: AC | PRN
  Administered 2017-10-09: 2 mg via INTRAVENOUS

## 2017-10-09 NOTE — Procedures (Signed)
  Procedure: L IJ Port placement   EBL:   minimal Complications:  none immediate  See full dictation in [facility].  Dillard Cannon MD Main # [phone] Pager  [phone]

## 2017-10-13 ENCOUNTER — Telehealth: Payer: Self-pay | Admitting: *Deleted

## 2017-10-13 MED ORDER — LIDOCAINE-PRILOCAINE 2.5-2.5 % EX CREA: 1.0000 "application " | TOPICAL_CREAM | CUTANEOUS | 1 refills | Status: AC | PRN

## 2017-10-13 NOTE — Telephone Encounter (Signed)
Asking for EMLA Cream

## 2017-10-13 NOTE — Telephone Encounter (Signed)
-----   Message from Wilburn Cornelia sent at 10/13/2017 10:38 AM EST ----- Regarding: numbing cream Contact: [phone] had port placement

## 2017-10-17 ENCOUNTER — Other Ambulatory Visit: Payer: Self-pay | Admitting: *Deleted

## 2017-10-17 ENCOUNTER — Inpatient Hospital Stay: Payer: Medicare Other

## 2017-10-17 ENCOUNTER — Inpatient Hospital Stay: Payer: Medicare Other | Attending: Internal Medicine

## 2017-10-17 ENCOUNTER — Telehealth: Payer: Self-pay | Admitting: *Deleted

## 2017-10-17 DIAGNOSIS — N183 Chronic kidney disease, stage 3 (moderate): Secondary | ICD-10-CM | POA: Diagnosis not present

## 2017-10-17 DIAGNOSIS — Z87891 Personal history of nicotine dependence: Secondary | ICD-10-CM | POA: Diagnosis not present

## 2017-10-17 DIAGNOSIS — C9200 Acute myeloblastic leukemia, not having achieved remission: Secondary | ICD-10-CM | POA: Diagnosis present

## 2017-10-17 DIAGNOSIS — I700 Atherosclerosis of aorta: Secondary | ICD-10-CM | POA: Diagnosis not present

## 2017-10-17 DIAGNOSIS — R350 Frequency of micturition: Secondary | ICD-10-CM | POA: Diagnosis not present

## 2017-10-17 DIAGNOSIS — D630 Anemia in neoplastic disease: Secondary | ICD-10-CM | POA: Diagnosis not present

## 2017-10-17 DIAGNOSIS — D46Z Other myelodysplastic syndromes: Secondary | ICD-10-CM

## 2017-10-17 LAB — CBC WITH DIFFERENTIAL/PLATELET
Basophils Absolute: 0 10*3/uL (ref 0–0.1)
Basophils Relative: 1 %
EOS ABS: 0 10*3/uL (ref 0–0.7)
EOS PCT: 0 %
HCT: 27.9 % — ABNORMAL LOW (ref 35.0–47.0)
Hemoglobin: 9.4 g/dL — ABNORMAL LOW (ref 12.0–16.0)
LYMPHS ABS: 0.8 10*3/uL — AB (ref 1.0–3.6)
Lymphocytes Relative: 45 %
MCH: 35.4 pg — ABNORMAL HIGH (ref 26.0–34.0)
MCHC: 33.6 g/dL (ref 32.0–36.0)
MCV: 105.2 fL — AB (ref 80.0–100.0)
MONO ABS: 0.1 10*3/uL — AB (ref 0.2–0.9)
Monocytes Relative: 8 %
Neutro Abs: 0.8 10*3/uL — ABNORMAL LOW (ref 1.4–6.5)
Neutrophils Relative %: 46 %
PLATELETS: 13 10*3/uL — AB (ref 150–400)
RBC: 2.65 MIL/uL — AB (ref 3.80–5.20)
RDW: 17.4 % — AB (ref 11.5–14.5)
WBC: 1.7 10*3/uL — AB (ref 3.6–11.0)

## 2017-10-17 LAB — BASIC METABOLIC PANEL
Anion gap: 9 (ref 5–15)
BUN: 25 mg/dL — AB (ref 6–20)
CO2: 26 mmol/L (ref 22–32)
CREATININE: 1.19 mg/dL — AB (ref 0.44–1.00)
Calcium: 9.1 mg/dL (ref 8.9–10.3)
Chloride: 104 mmol/L (ref 101–111)
GFR calc Af Amer: 50 mL/min — ABNORMAL LOW (ref >60)
GFR, EST NON AFRICAN AMERICAN: 43 mL/min — AB (ref >60)
GLUCOSE: 115 mg/dL — AB (ref 65–99)
Potassium: 4.8 mmol/L (ref 3.5–5.1)
SODIUM: 139 mmol/L (ref 135–145)

## 2017-10-17 LAB — SAMPLE TO BLOOD BANK

## 2017-10-17 MED ORDER — SODIUM CHLORIDE 0.9 % IV SOLN
250.0000 mL | Freq: Once | INTRAVENOUS | Status: AC
  Administered 2017-10-17: 250 mL via INTRAVENOUS
  Filled 2017-10-17: qty 250

## 2017-10-17 MED ORDER — ACETAMINOPHEN 325 MG PO TABS
650.0000 mg | ORAL_TABLET | Freq: Once | ORAL | Status: AC
  Administered 2017-10-17: 650 mg via ORAL
  Filled 2017-10-17: qty 2

## 2017-10-17 MED ORDER — DIPHENHYDRAMINE HCL 25 MG PO CAPS
25.0000 mg | ORAL_CAPSULE | Freq: Once | ORAL | Status: AC
  Administered 2017-10-17: 25 mg via ORAL
  Filled 2017-10-17: qty 1

## 2017-10-17 NOTE — Telephone Encounter (Signed)
Spoke with Sonia Baller, ordered 1 unit of plts today.  Dr. Rogue Bussing informed of critical value [phone]

## 2017-10-17 NOTE — Telephone Encounter (Signed)
Lab called critical Plt count of 13

## 2017-10-18 LAB — PREPARE PLATELET PHERESIS: Unit division: 0

## 2017-10-18 LAB — BPAM PLATELET PHERESIS
Blood Product Expiration Date: 201902102359
ISSUE DATE / TIME: 201902081150
Unit Type and Rh: 8400

## 2017-10-20 ENCOUNTER — Other Ambulatory Visit: Admit: 2017-10-20 | Discharge: 2017-10-20 | Payer: MEDICARE

## 2017-10-20 ENCOUNTER — Encounter: Admit: 2017-10-20 | Discharge: 2017-10-20 | Payer: MEDICARE | Attending: Internal Medicine | Primary: Internal Medicine

## 2017-10-20 ENCOUNTER — Encounter: Admit: 2017-10-20 | Discharge: 2017-10-20 | Payer: MEDICARE

## 2017-10-20 DIAGNOSIS — C9200 Acute myeloblastic leukemia, not having achieved remission: Principal | ICD-10-CM

## 2017-10-20 LAB — COMPREHENSIVE METABOLIC PANEL
ALBUMIN: 4.2 g/dL (ref 3.5–5.0)
ALKALINE PHOSPHATASE: 69 U/L (ref 38–126)
ALT (SGPT): 18 U/L (ref 15–48)
ANION GAP: 8 mmol/L — ABNORMAL LOW (ref 9–15)
AST (SGOT): 15 U/L (ref 14–38)
BILIRUBIN TOTAL: 1 mg/dL (ref 0.0–1.2)
BLOOD UREA NITROGEN: 20 mg/dL (ref 7–21)
BUN / CREAT RATIO: 19
CALCIUM: 9.4 mg/dL (ref 8.5–10.2)
CHLORIDE: 107 mmol/L (ref 98–107)
CO2: 29 mmol/L (ref 22.0–30.0)
CREATININE: 1.05 mg/dL — ABNORMAL HIGH (ref 0.60–1.00)
GLUCOSE RANDOM: 102 mg/dL (ref 65–179)
POTASSIUM: 4.6 mmol/L (ref 3.5–5.0)
PROTEIN TOTAL: 6.4 g/dL — ABNORMAL LOW (ref 6.5–8.3)
SODIUM: 144 mmol/L (ref 135–145)

## 2017-10-20 LAB — CBC W/ AUTO DIFF
BASOPHILS ABSOLUTE COUNT: 0 10*9/L (ref 0.0–0.1)
EOSINOPHILS ABSOLUTE COUNT: 0 10*9/L (ref 0.0–0.4)
EOSINOPHILS RELATIVE PERCENT: 0.4 %
HEMATOCRIT: 30.1 % — ABNORMAL LOW (ref 36.0–46.0)
HEMOGLOBIN: 9.9 g/dL — ABNORMAL LOW (ref 12.0–16.0)
LARGE UNSTAINED CELLS: 2 % (ref 0–4)
LYMPHOCYTES ABSOLUTE COUNT: 0.6 10*9/L — ABNORMAL LOW (ref 1.5–5.0)
LYMPHOCYTES RELATIVE PERCENT: 34.7 %
MEAN CORPUSCULAR HEMOGLOBIN CONC: 32.7 g/dL (ref 31.0–37.0)
MEAN CORPUSCULAR HEMOGLOBIN: 34.8 pg — ABNORMAL HIGH (ref 26.0–34.0)
MEAN PLATELET VOLUME: 7.3 fL (ref 7.0–10.0)
MONOCYTES ABSOLUTE COUNT: 0.1 10*9/L — ABNORMAL LOW (ref 0.2–0.8)
MONOCYTES RELATIVE PERCENT: 4.6 %
NEUTROPHILS ABSOLUTE COUNT: 1 10*9/L — ABNORMAL LOW (ref 2.0–7.5)
NEUTROPHILS RELATIVE PERCENT: 57.9 %
PLATELET COUNT: 15 10*9/L — ABNORMAL LOW (ref 150–440)
RED BLOOD CELL COUNT: 2.84 10*12/L — ABNORMAL LOW (ref 4.00–5.20)
RED CELL DISTRIBUTION WIDTH: 18.3 % — ABNORMAL HIGH (ref 12.0–15.0)
WBC ADJUSTED: 1.7 10*9/L — ABNORMAL LOW (ref 4.5–11.0)

## 2017-10-20 LAB — ANISOCYTOSIS

## 2017-10-20 LAB — GLUCOSE RANDOM: Glucose:MCnc:Pt:Ser/Plas:Qn:: 102

## 2017-10-20 LAB — PLATELET COUNT
Lab: 40 — ABNORMAL LOW
PLATELET COUNT: 40 10*9/L — ABNORMAL LOW (ref 150–440)

## 2017-10-20 MED ORDER — TRANEXAMIC ACID 650 MG TABLET
ORAL_TABLET | 2 refills | 0 days | Status: CP
Start: 2017-10-20 — End: 2018-03-09

## 2017-10-20 NOTE — Unmapped (Signed)
[facility] Leukemia Clinic Follow-up    Patient Name: Jessica Wilson  Patient Age: 77 y.o.  Encounter Date: 10/20/2017    Primary Care Provider:  Kerman Passey, MD    Referring Physician:  Nigel Bridgeman, MD  [address]  [address]  [place], [place] [address]    Reason for visit:  Follow up of MDS progressed to AML, on enasidenib    History of Present Illness:  We had the pleasure of seeing Jessica Wilson in the Leukemia Clinic at the [facility] on 10/20/2017.  She is a 77 y.o. 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      Current therapy is enasidenib.    Her oncologic history is as follows:    Oncology History    Referring/Local Oncologist: Dr. Louretta Shorten, [facility]    Diagnosis: MDS with Excess Blasts-2    Genetics:   Karyotype/FISH: 46XX     Molecular Genetics: not performed    Pertinent Phenotypic data: no aberrant immunophenotype by flow cytometry, however blasts stained by IHC for CD117, MPO.  Only 5% marrow cells stained for CD34.    Disease-specific prognostic estimation: IPSS-R high risk, median OS 1.6 years, with 25% AML risk at 1.4 years            MDS (myelodysplastic syndrome), high grade (CMS-HCC)    04/17/2016 Initial Diagnosis     MDS (myelodysplastic syndrome), high grade (RAF-HCC)       04/29/2016 -  Chemotherapy     Azacitidine cycle 1: 75 mg/m2 [place] days 1-7 of 28-day cycles         05/27/2016 -  Chemotherapy     Azacitidine cycle 2: 75 mg/m2 [place] days 1-7 of 28 day cycles         06/24/2016 -  Chemotherapy     Azacitidine cycle 3: 75 mg/m2 [place] days 1-7 of 28 day cycle         07/22/2016 -  Chemotherapy     Azacitidine cycle 4: 75 mg/m2 [place] days 1-7 of 28 day cycle         08/19/2016 -  Chemotherapy     Azacitidine cycle 5: 75 mg/m2 [place] x 7 days of 28 day cycle         09/16/2016 -  Chemotherapy     Azacitidine cycle 6: 75 mbg/m2 [place] x 7 days of 28 day cycle         10/14/2016 -  Chemotherapy     Azacitidine cycle 7: 75 mg/m2 [place] x 7 days of 28 day cycle 12/09/2016 -  Chemotherapy     Azacitidine cycle 8: 60 mg/m2 [place] x 7 days of 28 day (cycle reduced by 20% due to hematologic toxicity)         01/13/2017 -  Chemotherapy     Azacitidine cycle 9: 60 mg/m2 [place] x 5 days of 28 day cycle (reduced by 2 days and 20% per dose due to hematologic toxicity)         05/29/2017 Progression     Given increasing transfusion requirements, repeat bone marrow biopsy done, now with 35% blasts and meets criteria for progression to AML.           Acute myeloid leukemia not having achieved remission (CMS-HCC)    06/02/2017 Initial Diagnosis     Acute myeloid leukemia not having achieved remission         06/28/2017 - 07/17/2017 Chemotherapy  enasidenib 100 mg by mouth daily         07/17/2017 Adverse Reaction     Acute abdominal pain, splenomegaly, AKI , elevated transaminases.  Enasidenib held thru 11/13 and treated empirically for differentiation syndrome with dexamethasone. Resumed enasidenib 07/23/17         07/23/2017 -  Chemotherapy     enasidenib 100 mg PO daily          Interim History:  Since last seen here, [name] has continued on enasidenib.  She is here today with her husband and sister.  They all attest to the fact that she is more active and feeling better in general with regard to her energy.  Her sister says that she is 40% better.  She continues to have occasional need for platelet transfusions and was last transfused red blood cells about 2 weeks ago.  Overnight, she had some oozing from her gums, which always seems to be in the left upper alveolar ridge area.  She has not had other bleeding.  Overall, she is pleased with how she feels on her current therapy.    Otherwise, she denies new constitutional symptoms such as anorexia, weight loss night sweats or unexplained fevers.  Furthermore, she denies symptoms of marrow failure: unexplained bleeding or bruising, recurrent or unexplained intercurrent infections, dyspnea on exertion, lightheadedness, palpitations or chest pain.  There have been no new or unexplained pains or self-identified masses, swelling or enlarged lymph nodes.    Past Medical, Surgical and Family History were reviewed and pertinent updates were made in the Electronic Medical Record    Review of Systems:  Other than as reported above in the interim history, all other systems were negative.    ECOG Performance Status: 2    Medications:    Current Outpatient Prescriptions   Medication Sig Dispense Refill   ??? acetaminophen (TYLENOL) 325 MG tablet Take 650 mg by mouth every six (6) hours as needed for pain.     ??? acyclovir (ZOVIRAX) 400 MG tablet Take 400 mg by mouth Two (2) times a day.      ??? buPROPion (WELLBUTRIN SR) 150 MG 12 hr tablet 2 tabs po daily  1   ??? clonazePAM (KLONOPIN) 0.5 MG tablet Take 1 mg by mouth two (2) times a day as needed for anxiety.      ??? enasidenib (IDHIFA) 100 mg tablet Take 100 mg by mouth daily.     ??? lidocaine-prilocaine (EMLA) cream   1   ??? sertraline (ZOLOFT) 100 MG tablet Take 150 mg by mouth daily.      ??? umeclidinium-vilanterol (ANORO ELLIPTA) 62.5-25 mcg/actuation inhaler Inhale 1 puff daily.     ??? buPROPion (WELLBUTRIN) 100 MG tablet Take 300 mg by mouth daily at 0600.      ??? levoFLOXacin (LEVAQUIN) 500 MG tablet   0   ??? metFORMIN (GLUCOPHAGE) 500 MG tablet Take 500 mg by mouth nightly.      ??? rosuvastatin (CRESTOR) 20 MG tablet Take 20 mg by mouth daily.      ??? tranexamic acid (LYSTEDA) 650 mg Tab tablet Take 2 tabs daily as needed for bleeding 60 tablet 2     No current facility-administered medications for this visit.      Vital Signs:  Vitals:    10/20/17 1023   BP: 158/69   Pulse: 74   Resp: 18   Temp: 36.9 ??C (98.5 ??F)   SpO2: 98%     Physical Exam:  General: Resting in no apparent distress  HEENT: Stable disconjugate gaze.  Pupils are equal, round and reactive to light and accomodation.  There is no scleral icterus and no conjunctival injection.  The oral mucosa does not demonstrate ulceration, erythema, exudate or purpura, though there is a small area of oozing between 2 teeth in the left upper gumline..  Nares show no bleeding.  No thyromegaly.  No jugular venous distention.    Lymph node exam:  No lymphadenopathy in the occipital, auricular, anterior/posterior cervical, submental, supraclavicular, axillary, epitrochleal or inguinal basins.  Heart:  Regular rate and rhythm.  Normal S1 and S2 without S3 or S4.  There are no murmurs, gallops or rubs.  Lungs:  Breathing is unlabored and patient is speaking full sentences with ease.  No stridor.  Auscultation of lung fields reveals normal air movement without rales, ronchi or crackles.  Diaphragmatic excursion is normal and symmetric.  Abdomen:  No distention or pain on palpation.  Bowel sounds are present and normal in quality.  There is no palpable hepatomegaly or splenomegaly.  No palpable masses.  Skin:  No rashes, petechiae or purpura.  No areas of skin breakdown.  Musculoskeletal:  There are no grossly-evident joint effusions or deformities.  Range of motion about the shoulder, elbow, hips and knees is grossly normal.  There is no pain on palpation of the spinous processes of the cervical, thoracic or lumbar vertebral bodies.  Psychiatric:  Alert and oriented to person, place, time and situation.  Range of affect is appropriate.    Neurologic:  CN II-XII are normal and symmetric.  Strength and sensation to pinprick/fine touch/proprioception are normal in bilateral upper and lower extremities.  Deep tendon reflexes are normal and symmetric at the patellar, biceps and brachioradialis tendons.  Gait is normal.  Cerebellar tasks are completed with ease and are symmetric.  Extremities:  Appear well-perfused, with radial and dorsalis pedis pulses 2+.  There is no clubbing, but there is 1+ bilateral edema.  No cyanosis.      Relevant Laboratory, radiology and pathology results:  I personally viewed the most recent internal records and labs and discussed the available results with the patient or family.  A summary of results follows:  Lab on 10/20/2017   Component Date Value Ref Range Status   ??? Sodium 10/20/2017 144  135 - 145 mmol/L Final   ??? Potassium 10/20/2017 4.6  3.5 - 5.0 mmol/L Final   ??? Chloride 10/20/2017 107  98 - 107 mmol/L Final   ??? CO2 10/20/2017 29.0  22.0 - 30.0 mmol/L Final   ??? BUN 10/20/2017 20  7 - 21 mg/dL Final   ??? Creatinine 10/20/2017 1.05* 0.60 - 1.00 mg/dL Final   ??? BUN/Creatinine Ratio 10/20/2017 19   Final   ??? EGFR MDRD Non Af Amer 10/20/2017 51* >=60 mL/min/1.88m2 Final   ??? EGFR MDRD Af Amer 10/20/2017 >=60  >=60 mL/min/1.8m2 Final   ??? Anion Gap 10/20/2017 8* 9 - 15 mmol/L Final   ??? Glucose 10/20/2017 102  65 - 179 mg/dL Final   ??? Calcium 16/06/9603 9.4  8.5 - 10.2 mg/dL Final   ??? Albumin 54/05/8118 4.2  3.5 - 5.0 g/dL Final   ??? Total Protein 10/20/2017 6.4* 6.5 - 8.3 g/dL Final   ??? Total Bilirubin 10/20/2017 1.0  0.0 - 1.2 mg/dL Final   ??? AST 14/78/2956 15  14 - 38 U/L Final   ??? ALT 10/20/2017 18  15 - 48 U/L Final   ???  Alkaline Phosphatase 10/20/2017 69  38 - 126 U/L Final   ??? Results Verified by Slide Scan 10/20/2017 Slide Reviewed   Final   ??? WBC 10/20/2017 1.7* 4.5 - 11.0 10*9/L Final   ??? RBC 10/20/2017 2.84* 4.00 - 5.20 10*12/L Final   ??? HGB 10/20/2017 9.9* 12.0 - 16.0 g/dL Final   ??? HCT 47/82/9562 30.1* 36.0 - 46.0 % Final   ??? MCV 10/20/2017 106.2* 80.0 - 100.0 fL Final   ??? MCH 10/20/2017 34.8* 26.0 - 34.0 pg Final   ??? MCHC 10/20/2017 32.7  31.0 - 37.0 g/dL Final   ??? RDW 13/04/6577 18.3* 12.0 - 15.0 % Final   ??? MPV 10/20/2017 7.3  7.0 - 10.0 fL Final   ??? Platelet 10/20/2017 15* 150 - 440 10*9/L Final   ??? Variable HGB Concentration 10/20/2017 Slight* Not Present Final   ??? Neutrophils % 10/20/2017 57.9  % Final   ??? Lymphocytes % 10/20/2017 34.7  % Final   ??? Monocytes % 10/20/2017 4.6  % Final   ??? Eosinophils % 10/20/2017 0.4  % Final   ??? Basophils % 10/20/2017 0.5  % Final   ??? Absolute Neutrophils 10/20/2017 1.0* 2.0 - 7.5 10*9/L Final   ??? Absolute Lymphocytes 10/20/2017 0.6* 1.5 - 5.0 10*9/L Final   ??? Absolute Monocytes 10/20/2017 0.1* 0.2 - 0.8 10*9/L Final   ??? Absolute Eosinophils 10/20/2017 0.0  0.0 - 0.4 10*9/L Final   ??? Absolute Basophils 10/20/2017 0.0  0.0 - 0.1 10*9/L Final   ??? Large Unstained Cells 10/20/2017 2  0 - 4 % Final   ??? Macrocytosis 10/20/2017 Marked* Not Present Final   ??? Anisocytosis 10/20/2017 Moderate* Not Present Final   ??? Hypochromasia 10/20/2017 Marked* Not Present Final     Assessment:  [name] is a 77 y.o. year old female with AML progressed from previous Myelodysplastic Syndrome s/p 12 cycles of azacitidine prior to progression to AML. She initially had hematologic improvement but had eventual progression requiring more transfusions and with 38% blasts in the bone marrow. She was started on enasidenib on 06/28/17, and had clearance of peripheral blood blasts, avoidance of severe neutropenia and improvement in red blood cell transfusion requirements.  She still has significant thrombocytopenia and some oozing today.  Overall, she will continue enasidenib and receive platelets today, as she has had an excellent palliative benefit from this therapy..    Plan and Recommendations:  **AML, secondary after MDS  - Continue enasidenib 100 mg daily until progression or intolerance.  Given the fact that she is not on a clinical trial and is having obvious clinical benefit, I do not feel the need to document depth of remission with a bone marrow biopsy.  - Labs 2x/week locally w/ PRN transfusions.  Hopefully with a deeper response, platelet monitoring needs will be less frequent.  -She is likely through the window of risk from differentiation syndrome from this therapy.  Nonetheless, if she develops fever, dyspnea, hypoxia or other end organ issues, I think it would be reasonable to entertain the diagnosis of differentiation syndrome.    **Tumor lysis prophylaxis: She no longer needs allopurinol.    **Hematologic support: She will receive irradiated blood products when available.  If she has emergent bleeding complications, nonirradiated blood products are acceptable.  In general, she will receive red blood cell transfusion for hemoglobin less than 8 g/dL and platelets for platelet count less than 10,000 or active bleeding.  Since she is oozing today, I recommended  a platelet transfusion.    **Infection prophylaxis: Given resolution of neutropenia, I only recommend that she remain on valacyclovir.    **Gum bleeding: I gave her tranexamic acid to use on an as-needed basis for her bruising or bleeding.  Platelet transfusion today.  Given the fact that she always has trouble with the same area in her mouth, I recommended dental evaluation made a referral to our hospital dental clinic.    RTC 4-6 weeks.  In the interval, she will continue follow up and supportive care with Dr. Donneta Romberg.      Troy Sine Malen Gauze, MD  Leukemia Program  Division of Hematology/Oncology  [facility]    Nurse Navigator (non-clinical trial patients): Gwyneth Sprout, RN        Tel. [phone]       Fax. [phone]  Toll-free appointments: [phone]  Scheduling assistance: [phone]  After hours/weekends: [phone] (ask for adult hematology/oncology on-call)

## 2017-10-20 NOTE — Unmapped (Signed)
Pt completed treatment w/ platelet transfusion for platelet count of 15,but symptomatic w/ bleeding gums, without complications. PIV D/C'd intact per protocol. Pt discharged from clinic stable and ambulatory; accompanied by self.  Post platelets count up to 40k.

## 2017-10-20 NOTE — Unmapped (Signed)
I have referred you to our dental clinic to address the bleeding area of your teeth and gums.    We will give you a platelet transfusion today.  I will call in some medication to help stop oozing when it occurs.    We will see you in a month.    Continue the enasidenib 100 mg per day.    Lab on 10/20/2017   Component Date Value Ref Range Status   ??? Sodium 10/20/2017 144  135 - 145 mmol/L Final   ??? Potassium 10/20/2017 4.6  3.5 - 5.0 mmol/L Final   ??? Chloride 10/20/2017 107  98 - 107 mmol/L Final   ??? CO2 10/20/2017 29.0  22.0 - 30.0 mmol/L Final   ??? BUN 10/20/2017 20  7 - 21 mg/dL Final   ??? Creatinine 10/20/2017 1.05* 0.60 - 1.00 mg/dL Final   ??? BUN/Creatinine Ratio 10/20/2017 19   Final   ??? EGFR MDRD Non Af Amer 10/20/2017 51* >=60 mL/min/1.38m2 Final   ??? EGFR MDRD Af Amer 10/20/2017 >=60  >=60 mL/min/1.65m2 Final   ??? Anion Gap 10/20/2017 8* 9 - 15 mmol/L Final   ??? Glucose 10/20/2017 102  65 - 179 mg/dL Final   ??? Calcium 16/06/9603 9.4  8.5 - 10.2 mg/dL Final   ??? Albumin 54/05/8118 4.2  3.5 - 5.0 g/dL Final   ??? Total Protein 10/20/2017 6.4* 6.5 - 8.3 g/dL Final   ??? Total Bilirubin 10/20/2017 1.0  0.0 - 1.2 mg/dL Final   ??? AST 14/78/2956 15  14 - 38 U/L Final   ??? ALT 10/20/2017 18  15 - 48 U/L Final   ??? Alkaline Phosphatase 10/20/2017 69  38 - 126 U/L Final   ??? Results Verified by Slide Scan 10/20/2017 Slide Reviewed   Final   ??? WBC 10/20/2017 1.7* 4.5 - 11.0 10*9/L Final   ??? RBC 10/20/2017 2.84* 4.00 - 5.20 10*12/L Final   ??? HGB 10/20/2017 9.9* 12.0 - 16.0 g/dL Final   ??? HCT 21/30/8657 30.1* 36.0 - 46.0 % Final   ??? MCV 10/20/2017 106.2* 80.0 - 100.0 fL Final   ??? MCH 10/20/2017 34.8* 26.0 - 34.0 pg Final   ??? MCHC 10/20/2017 32.7  31.0 - 37.0 g/dL Final   ??? RDW 84/69/6295 18.3* 12.0 - 15.0 % Final   ??? MPV 10/20/2017 7.3  7.0 - 10.0 fL Final   ??? Platelet 10/20/2017 15* 150 - 440 10*9/L Final   ??? Variable HGB Concentration 10/20/2017 Slight* Not Present Final   ??? Neutrophils % 10/20/2017 57.9  % Final   ??? Lymphocytes % 10/20/2017 34.7  % Final   ??? Monocytes % 10/20/2017 4.6  % Final   ??? Eosinophils % 10/20/2017 0.4  % Final   ??? Basophils % 10/20/2017 0.5  % Final   ??? Absolute Neutrophils 10/20/2017 1.0* 2.0 - 7.5 10*9/L Final   ??? Absolute Lymphocytes 10/20/2017 0.6* 1.5 - 5.0 10*9/L Final   ??? Absolute Monocytes 10/20/2017 0.1* 0.2 - 0.8 10*9/L Final   ??? Absolute Eosinophils 10/20/2017 0.0  0.0 - 0.4 10*9/L Final   ??? Absolute Basophils 10/20/2017 0.0  0.0 - 0.1 10*9/L Final   ??? Large Unstained Cells 10/20/2017 2  0 - 4 % Final   ??? Macrocytosis 10/20/2017 Marked* Not Present Final   ??? Anisocytosis 10/20/2017 Moderate* Not Present Final   ??? Hypochromasia 10/20/2017 Marked* Not Present Final

## 2017-10-20 NOTE — Unmapped (Signed)
lab drawn and sent for analysis

## 2017-10-20 NOTE — Unmapped (Signed)
If you feel like this is an emergency please call 911.  For appointments or questions Monday through Friday 8AM-5PM please call [phone] or Toll Free [phone]. For Medical questions or concerns ask for the Nurse Triage Line.  On Nights, Weekends, and Holidays call [phone] and ask for the Oncologist on Call.  Reasons to call the Nurse Triage Line:  Fever of 100.5 or greater  Nausea and/or vomiting not relived with nausea medicine  Diarrhea or constipation  Severe pain not relieved with usual pain regimen  Shortness of breath  Uncontrolled bleeding  Mental status changes

## 2017-10-22 NOTE — Unmapped (Signed)
[facility] Refill Coordination Note  Specialty Medication(s): WNUUVO  Additional Medications shipped: none    Jessica Wilson, DOB: 09-10-1940  Phone: [phone] (home) [phone] (work), Alternate phone contact: N/A  Phone or address changes today?: No  All above HIPAA information was verified with patient.  Shipping Address: [address]  [place] [address]   Insurance changes? No    Completed refill call assessment today to schedule patient's medication shipment from the [facility] [phone]).      Confirmed the medication and dosage are correct and have not changed: Yes, regimen is correct and unchanged.    Confirmed patient started or stopped the following medications in the past month:  No, there are no changes reported at this time.    Are you tolerating your medication?:  Jessica Wilson reports tolerating the medication.    ADHERENCE        Did you miss any doses in the past 4 weeks? No missed doses reported.    FINANCIAL/SHIPPING    Delivery Scheduled: Yes, Expected medication delivery date: 10/27/17     Jessica Wilson did not have any additional questions at this time.    Delivery address validated in [facility] scheduling system: Yes, address listed in [facility] is correct.    We will follow up with patient monthly for standard refill processing and delivery.      Thank you,  Rollen Sox   [facility] Specialty Pharmacist

## 2017-10-24 ENCOUNTER — Inpatient Hospital Stay: Payer: Medicare Other

## 2017-10-24 ENCOUNTER — Inpatient Hospital Stay (HOSPITAL_BASED_OUTPATIENT_CLINIC_OR_DEPARTMENT_OTHER): Payer: Medicare Other | Admitting: Internal Medicine

## 2017-10-24 ENCOUNTER — Encounter: Payer: Self-pay | Admitting: Internal Medicine

## 2017-10-24 ENCOUNTER — Other Ambulatory Visit: Payer: Self-pay

## 2017-10-24 DIAGNOSIS — I700 Atherosclerosis of aorta: Secondary | ICD-10-CM

## 2017-10-24 DIAGNOSIS — C9200 Acute myeloblastic leukemia, not having achieved remission: Secondary | ICD-10-CM

## 2017-10-24 DIAGNOSIS — R350 Frequency of micturition: Secondary | ICD-10-CM

## 2017-10-24 DIAGNOSIS — N183 Chronic kidney disease, stage 3 (moderate): Secondary | ICD-10-CM | POA: Diagnosis not present

## 2017-10-24 DIAGNOSIS — Z95828 Presence of other vascular implants and grafts: Secondary | ICD-10-CM

## 2017-10-24 DIAGNOSIS — D4622 Refractory anemia with excess of blasts 2: Secondary | ICD-10-CM | POA: Diagnosis not present

## 2017-10-24 DIAGNOSIS — D630 Anemia in neoplastic disease: Secondary | ICD-10-CM

## 2017-10-24 LAB — BASIC METABOLIC PANEL
ANION GAP: 9 (ref 5–15)
BUN: 24 mg/dL — ABNORMAL HIGH (ref 6–20)
CALCIUM: 9 mg/dL (ref 8.9–10.3)
CO2: 25 mmol/L (ref 22–32)
Chloride: 105 mmol/L (ref 101–111)
Creatinine, Ser: 1.24 mg/dL — ABNORMAL HIGH (ref 0.44–1.00)
GFR calc Af Amer: 48 mL/min — ABNORMAL LOW (ref >60)
GFR calc non Af Amer: 41 mL/min — ABNORMAL LOW (ref >60)
GLUCOSE: 118 mg/dL — AB (ref 65–99)
Potassium: 4.4 mmol/L (ref 3.5–5.1)
Sodium: 139 mmol/L (ref 135–145)

## 2017-10-24 LAB — URINALYSIS, COMPLETE (UACMP) WITH MICROSCOPIC
Bacteria, UA: NONE SEEN
Bilirubin Urine: NEGATIVE
Glucose, UA: NEGATIVE mg/dL
Ketones, ur: NEGATIVE mg/dL
Leukocytes, UA: NEGATIVE
Nitrite: NEGATIVE
PROTEIN: NEGATIVE mg/dL
Specific Gravity, Urine: 1.015 (ref 1.005–1.030)
WBC UA: NONE SEEN WBC/hpf (ref 0–5)
pH: 7 (ref 5.0–8.0)

## 2017-10-24 LAB — CBC WITH DIFFERENTIAL/PLATELET
BASOS PCT: 4 %
Basophils Absolute: 0.1 10*3/uL (ref 0–0.1)
EOS ABS: 0 10*3/uL (ref 0–0.7)
Eosinophils Relative: 1 %
HCT: 28 % — ABNORMAL LOW (ref 35.0–47.0)
HEMOGLOBIN: 9.6 g/dL — AB (ref 12.0–16.0)
LYMPHS PCT: 26 %
Lymphs Abs: 0.5 10*3/uL — ABNORMAL LOW (ref 1.0–3.6)
MCH: 35.3 pg — AB (ref 26.0–34.0)
MCHC: 34.2 g/dL (ref 32.0–36.0)
MCV: 103.3 fL — ABNORMAL HIGH (ref 80.0–100.0)
Monocytes Absolute: 0.2 10*3/uL (ref 0.2–0.9)
Monocytes Relative: 12 %
NEUTROS ABS: 1.2 10*3/uL — AB (ref 1.4–6.5)
Neutrophils Relative %: 57 %
PLATELETS: 23 10*3/uL — AB (ref 150–400)
RBC: 2.71 MIL/uL — ABNORMAL LOW (ref 3.80–5.20)
RDW: 16.8 % — ABNORMAL HIGH (ref 11.5–14.5)
SMEAR REVIEW: DECREASED
WBC: 2 10*3/uL — ABNORMAL LOW (ref 3.6–11.0)

## 2017-10-24 LAB — SAMPLE TO BLOOD BANK

## 2017-10-24 MED ORDER — HEPARIN SOD (PORK) LOCK FLUSH 100 UNIT/ML IV SOLN
INTRAVENOUS | Status: AC
  Filled 2017-10-24: qty 5

## 2017-10-24 MED ORDER — HEPARIN SOD (PORK) LOCK FLUSH 100 UNIT/ML IV SOLN
500.0000 [IU] | Freq: Once | INTRAVENOUS | Status: AC
  Administered 2017-10-24: 500 [IU] via INTRAVENOUS

## 2017-10-24 MED FILL — IDHIFA/100 MG/TAB: IDHIFA/100 MG/TAB | 30 days supply | Qty: 30 | Fill #4

## 2017-10-24 NOTE — Progress Notes (Signed)
No blood or platelets today per Dr. Rogue Bussing

## 2017-10-24 NOTE — Progress Notes (Signed)
Received phone call from [facility] in c.ctr lab on pt Teresa Carrillo - critical value of plts. Estimated count 20- 21. Manual diff is pending.

## 2017-10-24 NOTE — Assessment & Plan Note (Addendum)
AML - progressed from MDS- 12 cycles azacitadine; progressed to AML w/ 38% blasts- 05/2017. Repeat BMBx 11/12 at [facility] shows increased in blasts 35% to >60% per report. Currently on enasidenib (started 06/28/17).  # partial response- mostly improvement/stability of white count/hb; still needing ~ platelets ~ 1/week.  Given the clinical benefit-continue Enasidenib.  Discussed with Dr.'s Royce Macadamia.  Greatly appreciate his care/expertise.  # AKI/CKD - stage III.stable.   # increased frequency of urination/ back pain-  UA; await cultures.  Follow up in 3 weeks; bi- weekly labs [Monday/thursday]; UA.   Addendum: UA negative for infection.  Await cultures.

## 2017-10-24 NOTE — Progress Notes (Signed)
[place] NOTE  Patient Care Team: Lada, Satira Anis, [name] Cammie Sickle, MD as Consulting Physician (Internal Medicine) Gloriann Loan, MD as Attending Physician Erby Pian, MD as Referring Physician (Specialist) Yolonda Kida, MD as Consulting Physician (Cardiology) Albertine Patricia, [place] as Attending Physician (Podiatry) Lavonia Dana, MD as Consulting Physician (Nephrology)  CHIEF COMPLAINTS/PURPOSE OF CONSULTATION:   Oncology History   #JUNE 2017- Severe neutropenia/ Anemia ~hb 9/platlets- 85-100 AUG 2017- REFRACTORY ANEMIA with EXCESS BLASTS [14% blasts- BMBx]; cytogenetics/FISH-N [SNP micorarray- not done]   # AUG 21st 2017-  START AZA 57m/m2 day- 1-7 q 28 days x4 cycles; DEC 6th- BMBx- <5% blasts; hypercellular with dysplasia.   # SEP 20th 2018- ACUTE MYELOID LEUKEMIA [38% blasts on BMBx]; IDH-MUTATION PRESENT; low FLT-3; OCT 18th 2018- ENASIDENIB [Dr.Foster; [facility]]   # CKD stage III  ------------------------------------------------------   MOLECULAR TESTING: NGS sent 06/2017 -FLT3-ITD <0.05 mutational burden -IDH2 -DNMT3A --treatment options include FLT3 inhibition and IDH2 inhibition; given low level FZJI9-CVEallelic ratio [admittedly in peripheral blood, not bone marrow]- started on ENASIDENIB     MDS (myelodysplastic syndrome), high grade ([facility]   04/23/2016 Initial Diagnosis    MDS (myelodysplastic syndrome), high grade (HCC)       Acute myeloid leukemia not having achieved remission ([facility]    HISTORY OF PRESENTING ILLNESS:  FKENZLEY 89.  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  [facility] also [facility] 4 days ago.  No nosebleeds or gum bleeding. No nausea no vomiting or diarrhea.  Denies any significant weight gain or weight  loss.  Denies any swelling in the legs.  Patient complains of mild back pain increased frequency of urination.  No fevers or chills.  ROS: A complete 10 point review of system is done which is negative except mentioned above in history of present illness.  MEDICAL HISTORY:  Past Medical History:  Diagnosis Date  . Abdominal wall mass   . Anginal pain ([facility]   . Anxiety   . Arthritis   . Calculus of kidney   . Cystitis   . Depression   . Diabetes mellitus without complication (HCC)    elevated A1c  . Dyspnea on exertion   . Elevated serum creatinine   . Fibrocystic breast disease   . GERD (gastroesophageal reflux disease)   . Hearing loss   . Heart murmur   . HTN (hypertension)   . Hyperlipidemia   . MDS (myelodysplastic syndrome), high grade ([facility] 04/23/2016  . Microscopic hematuria   . Mouth sores   . Mucositis due to chemotherapy   . Obesity   . Obesity, morbid ([place] 09/24/2017   BMI > 35 with type 2 diabetes  . Pneumonia   . Risk for falls   . Sleep apnea   . Thrombocytopenia ([facility]   . Urinary frequency   . Urinary urgency     SURGICAL HISTORY: Past Surgical History:  Procedure Laterality Date  . ABDOMINAL HYSTERECTOMY    . APPENDECTOMY    . CARDIAC CATHETERIZATION     x2  . CHOLECYSTECTOMY    . COLONOSCOPY N/A 02/24/2015   Procedure: COLONOSCOPY;  Surgeon: RManya Silvas MD;  [facility]  Service: Endoscopy;  Laterality: N/A;  . DIAGNOSTIC LAPAROSCOPY     Removal of benign abdominal tumor  .  ESOPHAGOGASTRODUODENOSCOPY N/A 02/24/2015   Procedure: ESOPHAGOGASTRODUODENOSCOPY (EGD);  Surgeon: Manya Silvas, MD;  Location: [facility]COPY;  Service: Endoscopy;  Laterality: N/A;  . IR FLUORO GUIDE PORT INSERTION LEFT  10/09/2017  . right eye surgery Right     SOCIAL HISTORY: lives with family; [employer]; [employer] in [place] retd. No smoking/ no alcohol.  Social History   Socioeconomic History  . Marital status: Married    Spouse name: Not on  file  . Number of children: Not on file  . Years of education: Not on file  . Highest education level: Not on file  Social Needs  . Financial resource strain: Not on file  . Food insecurity - worry: Not on file  . Food insecurity - inability: Not on file  . Transportation needs - medical: Not on file  . Transportation needs - non-medical: Not on file  Occupational History  . Not on file  Tobacco Use  . Smoking status: Former Smoker    Types: Cigarettes    Last attempt to quit: 09/09/1988    Years since quitting: 29.1  . Smokeless tobacco: Never Used  . Tobacco comment: quit 25 years ago  Substance and Sexual Activity  . Alcohol use: No    Alcohol/week: 0.0 oz  . Drug use: No  . Sexual activity: Not Currently    Birth control/protection: Post-menopausal  Other Topics Concern  . Not on file  Social History Narrative  . Not on file    FAMILY HISTORY: no cancers in family.  Family History  Problem Relation Age of Onset  . Congestive Heart Failure Mother   . Diabetes Mother   . Coronary artery disease Mother   . Stroke Mother   . Cirrhosis Father   . Diabetes Brother     ALLERGIES:  is allergic to macrobid [nitrofurantoin monohyd macro].  MEDICATIONS:  Current Outpatient Medications  Medication Sig Dispense Refill  . acyclovir (ZOVIRAX) 400 MG tablet Take 400 mg by mouth 2 (two) times daily.    Marland Kitchen albuterol (PROVENTIL HFA;VENTOLIN HFA) 108 (90 Base) MCG/ACT inhaler Inhale 2 puffs into the lungs every 6 (six) hours as needed for wheezing or shortness of breath. 1 Inhaler 2  . buPROPion (WELLBUTRIN XL) 150 MG 24 hr tablet Take 300 mg by mouth daily at 12 noon.     . clonazePAM (KLONOPIN) 0.5 MG tablet Take 0.5 mg by mouth 2 (two) times daily.     Marland Kitchen diltiazem (CARDIZEM CD) 180 MG 24 hr capsule Take 1 capsule (180 mg total) by mouth daily. 30 capsule 0  . IDHIFA 100 MG TABS Take 100 mg by mouth daily.    Marland Kitchen lidocaine-prilocaine (EMLA) cream Apply 1 application topically as  needed. Apply small amount to port site at least 1 hour prior to it being accessed, cover with plastic wrap 30 g 1  . ondansetron (ZOFRAN-ODT) 4 MG disintegrating tablet Take 4 mg by mouth every 8 (eight) hours as needed for nausea or vomiting.    . sertraline (ZOLOFT) 100 MG tablet Take 100 mg by mouth daily at 12 noon.     . tranexamic acid (LYSTEDA) 650 MG TABS tablet Take 2 tablets by mouth as needed for bleeding. Taking for bleeding gums    . umeclidinium-vilanterol (ANORO ELLIPTA) 62.5-25 MCG/INH AEPB Inhale 1 puff into the lungs as needed.    . prochlorperazine (COMPAZINE) 10 MG tablet Take 1 tablet (10 mg total) by mouth every 6 (six) hours as needed (Nausea or vomiting). (Patient not  taking: Reported on 09/24/2017) 30 tablet 1   No current facility-administered medications for this visit.       Marland Kitchen  PHYSICAL EXAMINATION: ECOG PERFORMANCE STATUS: 1 - Symptomatic but completely ambulatory  Vitals:   10/24/17 0855  BP: (!) 160/81  Pulse: 69  Resp: (!) 22  Temp: 97.9 F (36.6 C)  SpO2: 97%   Filed Weights   10/24/17 0855  Weight: 228 lb (103.4 kg)    GENERAL: Well-nourished well-developed; Alert, no distress and comfortable.   Obese. Accompanied by her husband. She is walking by herself. EYES: Positive for pallor. OROPHARYNX: no thrush or ulceration;  NECK: supple, no masses felt LYMPH:  no palpable lymphadenopathy in the cervical, axillary or inguinal regions LUNGS: Decreased breath sounds to auscultation and  No wheeze or crackles HEART/CVS: regular rate & rhythm and no murmurs; No lower extremity edema ABDOMEN: abdomen soft, non-tender and normal bowel sounds; possible splenomegaly. Musculoskeletal:no cyanosis of digits and no clubbing  PSYCH: alert & oriented x 3 with fluent speech NEURO: no focal motor/sensory deficits SKIN:  No skin rash/nodules.  LABORATORY DATA:  I have reviewed the data as listed Lab Results  Component Value Date   WBC 2.0 (L) 10/24/2017    HGB 9.6 (L) 10/24/2017   HCT 28.0 (L) 10/24/2017   MCV 103.3 (H) 10/24/2017   PLT 23 (LL) 10/24/2017   Recent Labs    07/17/17 1229  08/11/17 0948  08/25/17 0938  10/01/17 0811  10/09/17 1300 10/17/17 0847 10/24/17 0840  NA 137   < > 141   < > 140   < > 139   < > 144 139 139  K 3.6   < > 4.0   < > 4.3   < > 4.1   < > 4.1 4.8 4.4  CL 103   < > 104   < > 105   < > 106   < > 109 104 105  CO2 24   < > 24   < > 26   < > 27   < > _0 GLUCOSE 114*   < > 132*   < > 136*   < > 120*   < > 110* 115* 118*  BUN 16   < > 16   < > 17   < > 24*   < > 22* 25* 24*  CREATININE 1.37*   < > 1.23*   < > 1.28*   < > 1.24*   < > 1.16* 1.19* 1.24*  CALCIUM 9.2   < > 9.1   < > 9.0   < > 8.8*   < > 9.3 9.1 9.0  GFRNONAA 37*   < > 42*   < > 40*   < > 41*   < > 45* 43* 41*  GFRAA 43*   < > 48*   < > 46*   < > 48*   < > 52* 50* 48*  PROT 8.0   < > 7.4  --  6.9  --  6.7  --   --   --   --   ALBUMIN 3.6   < > 3.8  --  3.8  --  3.9  --   --   --   --   AST 29   < > 20  --  24  --  16  --   --   --   --   ALT 19   < >  15  --  27  --  13*  --   --   --   --   ALKPHOS 77   < > 68  --  62  --  63  --   --   --   --   BILITOT 1.2   < > 1.1  --  1.1  --  1.1  --   --   --   --   BILIDIR 0.1  --   --   --   --   --   --   --   --   --   --   IBILI 1.1*  --   --   --   --   --   --   --   --   --   --    < > = values in this interval not displayed.    RADIOGRAPHIC STUDIES: I have personally reviewed the radiological images as listed and agreed with the findings in the report. Ir Fluoro Guide Port Insertion Left  Result Date: 10/09/2017 CLINICAL DATA:  Leukemia, needs venous access for chemotherapy regimen. EXAM: TUNNELED PORT CATHETER PLACEMENT WITH ULTRASOUND AND FLUOROSCOPIC GUIDANCE FLUOROSCOPY TIME:  0.1 minute, 2 mGy ANESTHESIA/SEDATION: Intravenous Fentanyl and Versed were administered as conscious sedation during continuous monitoring of the patient's level of consciousness and physiological /  cardiorespiratory status by the radiology RN, with a total moderate sedation time of 18 minutes. TECHNIQUE: The procedure, risks, benefits, and alternatives were explained to the patient. Questions regarding the procedure were encouraged and answered. The patient understands and consents to the procedure. As antibiotic prophylaxis, cefazolin was ordered pre-procedure and administered intravenously within one hour of incision. Patency of the left IJ vein was confirmed with ultrasound with image documentation. An appropriate skin site was determined. Skin site was marked. Region was prepped using maximum barrier technique including cap and mask, sterile gown, sterile gloves, large sterile sheet, and Chlorhexidine as cutaneous antisepsis. The region was infiltrated locally with 1% lidocaine. Under real-time ultrasound guidance, the left IJ vein was accessed with a 21 gauge micropuncture needle; the needle tip within the vein was confirmed with ultrasound image documentation. Needle was exchanged over a 018 guidewire for transitional dilator which allowed passage of the [facility] wire into the IVC. Over this, the transitional dilator was exchanged for a 5 [place] MPA catheter. A small incision was made on the left anterior chest wall and a subcutaneous pocket fashioned. The power-injectable port was positioned and its catheter tunneled to the left IJ dermatotomy site. The MPA catheter was exchanged over an Amplatz wire for a peel-away sheath, through which the port catheter, which had been trimmed to the appropriate length, was advanced and positioned under fluoroscopy with its tip at the cavoatrial junction. Spot chest radiograph confirms good catheter position and no pneumothorax. The pocket was closed with deep interrupted and subcuticular continuous 3-0 Monocryl sutures. The port was flushed per protocol. The incisions were covered with Dermabond then covered with a sterile dressing. COMPLICATIONS: COMPLICATIONS None  immediate IMPRESSION: Technically successful left IJ power-injectable port catheter placement. Ready for routine use. Electronically Signed   By: Lucrezia Europe M.D.   On: 10/09/2017 15:49   IMPRESSION: 1. Mid and lower lung zone peribronchovascular ground-glass, nodularity and consolidation, likely due to an infectious bronchiolitis/bronchopneumonia. 2. Upper abdominal adenopathy, as on 07/17/2017. 3. Aortic atherosclerosis (ICD10-170.0). Moderate coronary artery calcification.   Electronically Signed   By: Lorin Picket M.D.  On: 08/25/2017 11:57 ASSESSMENT & PLAN:   Acute myeloid leukemia not having achieved remission ([place]) AML - progressed from MDS- 12 cycles azacitadine; progressed to AML w/ 38% blasts- 05/2017. Repeat BMBx 11/12 at [facility] shows increased in blasts 35% to >60% per report. Currently on enasidenib (started 06/28/17).  # partial response- mostly improvement/stability of white count/hb; still needing ~ platelets ~ 1/week.  Given the clinical benefit-continue Enasidenib.  Discussed with Dr.'s Royce Macadamia.  Greatly appreciate his care/expertise.  # AKI/CKD - stage III.stable.   # increased frequency of urination/ back pain-  UA; await cultures.  Follow up in 3 weeks; bi- weekly labs [Monday/thursday]; UA.   Addendum: UA negative for infection.  Await cultures.      Cammie Sickle, MD 10/25/2017 4:20 AM

## 2017-10-27 ENCOUNTER — Inpatient Hospital Stay: Payer: Medicare Other

## 2017-10-27 ENCOUNTER — Telehealth: Payer: Self-pay | Admitting: *Deleted

## 2017-10-27 DIAGNOSIS — C9200 Acute myeloblastic leukemia, not having achieved remission: Secondary | ICD-10-CM

## 2017-10-27 DIAGNOSIS — D630 Anemia in neoplastic disease: Secondary | ICD-10-CM | POA: Diagnosis not present

## 2017-10-27 LAB — CBC WITH DIFFERENTIAL/PLATELET
BASOS PCT: 0 %
Basophils Absolute: 0 10*3/uL (ref 0–0.1)
EOS PCT: 0 %
Eosinophils Absolute: 0 10*3/uL (ref 0–0.7)
HEMATOCRIT: 30.7 % — AB (ref 35.0–47.0)
HEMOGLOBIN: 10.3 g/dL — AB (ref 12.0–16.0)
Lymphocytes Relative: 39 %
Lymphs Abs: 0.8 10*3/uL — ABNORMAL LOW (ref 1.0–3.6)
MCH: 34.6 pg — AB (ref 26.0–34.0)
MCHC: 33.5 g/dL (ref 32.0–36.0)
MCV: 103.2 fL — AB (ref 80.0–100.0)
MONO ABS: 0.2 10*3/uL (ref 0.2–0.9)
MONOS PCT: 8 %
Neutro Abs: 1.1 10*3/uL — ABNORMAL LOW (ref 1.4–6.5)
Neutrophils Relative %: 53 %
PLATELETS: 15 10*3/uL — AB (ref 150–400)
RBC: 2.98 MIL/uL — AB (ref 3.80–5.20)
RDW: 16.6 % — ABNORMAL HIGH (ref 11.5–14.5)
WBC: 2.1 10*3/uL — ABNORMAL LOW (ref 3.6–11.0)

## 2017-10-27 LAB — BASIC METABOLIC PANEL
Anion gap: 7 (ref 5–15)
BUN: 20 mg/dL (ref 6–20)
CHLORIDE: 106 mmol/L (ref 101–111)
CO2: 26 mmol/L (ref 22–32)
CREATININE: 1.2 mg/dL — AB (ref 0.44–1.00)
Calcium: 9 mg/dL (ref 8.9–10.3)
GFR calc Af Amer: 50 mL/min — ABNORMAL LOW (ref >60)
GFR calc non Af Amer: 43 mL/min — ABNORMAL LOW (ref >60)
Glucose, Bld: 137 mg/dL — ABNORMAL HIGH (ref 65–99)
POTASSIUM: 4.7 mmol/L (ref 3.5–5.1)
Sodium: 139 mmol/L (ref 135–145)

## 2017-10-27 LAB — SAMPLE TO BLOOD BANK

## 2017-10-27 NOTE — Telephone Encounter (Signed)
Spoke with Dr. Rogue Bussing. No plts today. Spoke with patient. Pt informed to keep all scheduled apts

## 2017-10-27 NOTE — Telephone Encounter (Signed)
b called plt count of 15

## 2017-10-29 ENCOUNTER — Other Ambulatory Visit: Payer: Self-pay | Admitting: Internal Medicine

## 2017-10-29 DIAGNOSIS — D46Z Other myelodysplastic syndromes: Secondary | ICD-10-CM

## 2017-10-30 ENCOUNTER — Other Ambulatory Visit: Payer: Self-pay | Admitting: Nurse Practitioner

## 2017-10-30 ENCOUNTER — Inpatient Hospital Stay: Payer: Medicare Other

## 2017-10-30 ENCOUNTER — Other Ambulatory Visit: Payer: Self-pay | Admitting: *Deleted

## 2017-10-30 DIAGNOSIS — D46Z Other myelodysplastic syndromes: Secondary | ICD-10-CM

## 2017-10-30 DIAGNOSIS — C9202 Acute myeloblastic leukemia, in relapse: Secondary | ICD-10-CM

## 2017-10-30 DIAGNOSIS — Z95828 Presence of other vascular implants and grafts: Secondary | ICD-10-CM

## 2017-10-30 DIAGNOSIS — D630 Anemia in neoplastic disease: Secondary | ICD-10-CM | POA: Diagnosis not present

## 2017-10-30 DIAGNOSIS — D649 Anemia, unspecified: Secondary | ICD-10-CM

## 2017-10-30 DIAGNOSIS — D696 Thrombocytopenia, unspecified: Secondary | ICD-10-CM

## 2017-10-30 DIAGNOSIS — C9200 Acute myeloblastic leukemia, not having achieved remission: Secondary | ICD-10-CM

## 2017-10-30 LAB — CBC WITH DIFFERENTIAL/PLATELET
BASOS ABS: 0 10*3/uL (ref 0–0.1)
Basophils Relative: 2 %
EOS ABS: 0 10*3/uL (ref 0–0.7)
Eosinophils Relative: 0 %
HCT: 30.7 % — ABNORMAL LOW (ref 35.0–47.0)
Hemoglobin: 10.2 g/dL — ABNORMAL LOW (ref 12.0–16.0)
LYMPHS ABS: 0.8 10*3/uL — AB (ref 1.0–3.6)
Lymphocytes Relative: 38 %
MCH: 34.2 pg — ABNORMAL HIGH (ref 26.0–34.0)
MCHC: 33.3 g/dL (ref 32.0–36.0)
MCV: 102.6 fL — ABNORMAL HIGH (ref 80.0–100.0)
MONO ABS: 0.1 10*3/uL — AB (ref 0.2–0.9)
Monocytes Relative: 5 %
Neutro Abs: 1.2 10*3/uL — ABNORMAL LOW (ref 1.4–6.5)
Neutrophils Relative %: 55 %
Platelets: 13 10*3/uL — CL (ref 150–440)
RBC: 2.99 MIL/uL — AB (ref 3.80–5.20)
RDW: 16.5 % — AB (ref 11.5–14.5)
WBC: 2.1 10*3/uL — ABNORMAL LOW (ref 3.6–11.0)

## 2017-10-30 LAB — BASIC METABOLIC PANEL
Anion gap: 6 (ref 5–15)
BUN: 19 mg/dL (ref 6–20)
CO2: 26 mmol/L (ref 22–32)
CREATININE: 1.03 mg/dL — AB (ref 0.44–1.00)
Calcium: 8.9 mg/dL (ref 8.9–10.3)
Chloride: 108 mmol/L (ref 101–111)
GFR calc Af Amer: 60 mL/min — ABNORMAL LOW (ref >60)
GFR, EST NON AFRICAN AMERICAN: 51 mL/min — AB (ref >60)
Glucose, Bld: 121 mg/dL — ABNORMAL HIGH (ref 65–99)
Potassium: 4.1 mmol/L (ref 3.5–5.1)
Sodium: 140 mmol/L (ref 135–145)

## 2017-10-30 LAB — SAMPLE TO BLOOD BANK

## 2017-10-30 MED ORDER — SODIUM CHLORIDE 0.9% FLUSH
10.0000 mL | INTRAVENOUS | Status: DC | PRN
  Filled 2017-10-30: qty 10

## 2017-10-30 MED ORDER — DIPHENHYDRAMINE HCL 25 MG PO CAPS: 25.0000 mg | ORAL_CAPSULE | Freq: Once | ORAL | Status: DC

## 2017-10-30 MED ORDER — ACETAMINOPHEN 325 MG PO TABS: 650.0000 mg | ORAL_TABLET | Freq: Once | ORAL | Status: DC

## 2017-10-30 MED ORDER — HEPARIN SOD (PORK) LOCK FLUSH 100 UNIT/ML IV SOLN
500.0000 [IU] | Freq: Once | INTRAVENOUS | Status: AC
  Administered 2017-10-30: 500 [IU] via INTRAVENOUS

## 2017-10-30 MED ORDER — SODIUM CHLORIDE 0.9 % IV SOLN
250.0000 mL | Freq: Once | INTRAVENOUS | Status: DC
  Filled 2017-10-30: qty 250

## 2017-10-30 MED ORDER — SODIUM CHLORIDE 0.9% FLUSH
10.0000 mL | INTRAVENOUS | Status: DC | PRN
  Administered 2017-10-30: 10 mL via INTRAVENOUS
  Filled 2017-10-30: qty 10

## 2017-10-30 MED ORDER — SODIUM CHLORIDE 0.9% FLUSH
3.0000 mL | INTRAVENOUS | Status: DC | PRN
  Filled 2017-10-30: qty 3

## 2017-10-30 NOTE — Progress Notes (Signed)
Plt count 13. Spoke to Dr. Rogue Bussing who requests transfusion of 1 u platelets. Blood bank will be able to have platelets ready tomorrow, 10/31/17. Nursing notified. Patient agrees to return to clinic on 10/31/17 for 1 unit platelet transfusion.

## 2017-10-31 ENCOUNTER — Inpatient Hospital Stay: Payer: Medicare Other

## 2017-10-31 ENCOUNTER — Other Ambulatory Visit: Payer: Self-pay | Admitting: Nurse Practitioner

## 2017-10-31 DIAGNOSIS — D46Z Other myelodysplastic syndromes: Secondary | ICD-10-CM

## 2017-10-31 DIAGNOSIS — D630 Anemia in neoplastic disease: Secondary | ICD-10-CM | POA: Diagnosis not present

## 2017-10-31 MED ORDER — HEPARIN SOD (PORK) LOCK FLUSH 100 UNIT/ML IV SOLN
500.0000 [IU] | Freq: Every day | INTRAVENOUS | Status: AC | PRN
  Administered 2017-10-31: 500 [IU]

## 2017-10-31 MED ORDER — SODIUM CHLORIDE 0.9% FLUSH
10.0000 mL | INTRAVENOUS | Status: DC | PRN
  Filled 2017-10-31: qty 10

## 2017-10-31 MED ORDER — SODIUM CHLORIDE 0.9 % IV SOLN
250.0000 mL | Freq: Once | INTRAVENOUS | Status: AC
  Administered 2017-10-31: 250 mL via INTRAVENOUS
  Filled 2017-10-31: qty 250

## 2017-10-31 NOTE — Unmapped (Signed)
Phoned dental clinic to f/u on referral. Left vm with patient information and callback information for NN.

## 2017-10-31 NOTE — Progress Notes (Signed)
Pt informed platelets would not be here for another 3-4 hours per blood bank, pt to return at 0900 on Monday for transfusion.

## 2017-11-03 ENCOUNTER — Inpatient Hospital Stay: Payer: Medicare Other

## 2017-11-03 DIAGNOSIS — C9200 Acute myeloblastic leukemia, not having achieved remission: Secondary | ICD-10-CM

## 2017-11-03 DIAGNOSIS — D46Z Other myelodysplastic syndromes: Secondary | ICD-10-CM

## 2017-11-03 DIAGNOSIS — D630 Anemia in neoplastic disease: Secondary | ICD-10-CM | POA: Diagnosis not present

## 2017-11-03 LAB — CBC WITH DIFFERENTIAL/PLATELET
BASOS PCT: 1 %
Basophils Absolute: 0 10*3/uL (ref 0–0.1)
EOS ABS: 0 10*3/uL (ref 0–0.7)
EOS PCT: 0 %
HCT: 27.9 % — ABNORMAL LOW (ref 35.0–47.0)
Hemoglobin: 9.6 g/dL — ABNORMAL LOW (ref 12.0–16.0)
LYMPHS ABS: 1.2 10*3/uL (ref 1.0–3.6)
Lymphocytes Relative: 46 %
MCH: 34.9 pg — AB (ref 26.0–34.0)
MCHC: 34.3 g/dL (ref 32.0–36.0)
MCV: 101.8 fL — ABNORMAL HIGH (ref 80.0–100.0)
Monocytes Absolute: 0.2 10*3/uL (ref 0.2–0.9)
Monocytes Relative: 9 %
Neutro Abs: 1.1 10*3/uL — ABNORMAL LOW (ref 1.4–6.5)
Neutrophils Relative %: 44 %
PLATELETS: 13 10*3/uL — AB (ref 150–400)
RBC: 2.74 MIL/uL — ABNORMAL LOW (ref 3.80–5.20)
RDW: 16.1 % — ABNORMAL HIGH (ref 11.5–14.5)
WBC: 2.6 10*3/uL — AB (ref 3.6–11.0)

## 2017-11-03 LAB — BASIC METABOLIC PANEL
Anion gap: 6 (ref 5–15)
BUN: 26 mg/dL — AB (ref 6–20)
CALCIUM: 8.8 mg/dL — AB (ref 8.9–10.3)
CHLORIDE: 108 mmol/L (ref 101–111)
CO2: 27 mmol/L (ref 22–32)
CREATININE: 1.23 mg/dL — AB (ref 0.44–1.00)
GFR calc non Af Amer: 42 mL/min — ABNORMAL LOW (ref >60)
GFR, EST AFRICAN AMERICAN: 48 mL/min — AB (ref >60)
Glucose, Bld: 135 mg/dL — ABNORMAL HIGH (ref 65–99)
Potassium: 3.8 mmol/L (ref 3.5–5.1)
Sodium: 141 mmol/L (ref 135–145)

## 2017-11-03 LAB — SAMPLE TO BLOOD BANK

## 2017-11-03 MED ORDER — DIPHENHYDRAMINE HCL 25 MG PO CAPS
25.0000 mg | ORAL_CAPSULE | Freq: Once | ORAL | Status: AC
  Administered 2017-11-03: 25 mg via ORAL

## 2017-11-03 MED ORDER — ACETAMINOPHEN 325 MG PO TABS
ORAL_TABLET | ORAL | Status: AC
  Filled 2017-11-03: qty 2

## 2017-11-03 MED ORDER — HEPARIN SOD (PORK) LOCK FLUSH 100 UNIT/ML IV SOLN
500.0000 [IU] | Freq: Every day | INTRAVENOUS | Status: AC | PRN
  Administered 2017-11-03: 500 [IU]
  Filled 2017-11-03: qty 5

## 2017-11-03 MED ORDER — DIPHENHYDRAMINE HCL 25 MG PO CAPS
ORAL_CAPSULE | ORAL | Status: AC
  Filled 2017-11-03: qty 1

## 2017-11-03 MED ORDER — ACETAMINOPHEN 325 MG PO TABS
650.0000 mg | ORAL_TABLET | Freq: Once | ORAL | Status: AC
  Administered 2017-11-03: 650 mg via ORAL

## 2017-11-03 MED ORDER — SODIUM CHLORIDE 0.9 % IV SOLN
250.0000 mL | Freq: Once | INTRAVENOUS | Status: AC
  Administered 2017-11-03: 250 mL via INTRAVENOUS
  Filled 2017-11-03: qty 250

## 2017-11-03 MED ORDER — SODIUM CHLORIDE 0.9% FLUSH
10.0000 mL | INTRAVENOUS | Status: DC | PRN
  Filled 2017-11-03: qty 10

## 2017-11-04 LAB — BPAM PLATELET PHERESIS
BLOOD PRODUCT EXPIRATION DATE: 201902252359
ISSUE DATE / TIME: 201902250935
Unit Type and Rh: 6200

## 2017-11-04 LAB — PREPARE PLATELET PHERESIS: Unit division: 0

## 2017-11-05 ENCOUNTER — Other Ambulatory Visit: Payer: Self-pay | Admitting: *Deleted

## 2017-11-05 ENCOUNTER — Telehealth: Payer: Self-pay | Admitting: *Deleted

## 2017-11-05 DIAGNOSIS — D46Z Other myelodysplastic syndromes: Secondary | ICD-10-CM

## 2017-11-05 NOTE — Telephone Encounter (Signed)
Encounter opened in error

## 2017-11-06 ENCOUNTER — Inpatient Hospital Stay: Payer: Medicare Other

## 2017-11-06 DIAGNOSIS — D630 Anemia in neoplastic disease: Secondary | ICD-10-CM | POA: Diagnosis not present

## 2017-11-06 DIAGNOSIS — D46Z Other myelodysplastic syndromes: Secondary | ICD-10-CM

## 2017-11-06 LAB — CBC WITH DIFFERENTIAL/PLATELET
Basophils Absolute: 0.1 10*3/uL (ref 0–0.1)
Basophils Relative: 3 %
EOS ABS: 0 10*3/uL (ref 0–0.7)
EOS PCT: 0 %
HCT: 29.8 % — ABNORMAL LOW (ref 35.0–47.0)
Hemoglobin: 10.2 g/dL — ABNORMAL LOW (ref 12.0–16.0)
LYMPHS ABS: 1 10*3/uL (ref 1.0–3.6)
LYMPHS PCT: 32 %
MCH: 34.3 pg — AB (ref 26.0–34.0)
MCHC: 34.2 g/dL (ref 32.0–36.0)
MCV: 100.5 fL — AB (ref 80.0–100.0)
MONO ABS: 0.2 10*3/uL (ref 0.2–0.9)
MONOS PCT: 7 %
Neutro Abs: 1.8 10*3/uL (ref 1.4–6.5)
Neutrophils Relative %: 58 %
PLATELETS: 31 10*3/uL — AB (ref 150–440)
RBC: 2.97 MIL/uL — AB (ref 3.80–5.20)
RDW: 15.6 % — AB (ref 11.5–14.5)
WBC: 3.2 10*3/uL — AB (ref 3.6–11.0)

## 2017-11-06 LAB — SAMPLE TO BLOOD BANK

## 2017-11-06 MED ORDER — HEPARIN SOD (PORK) LOCK FLUSH 100 UNIT/ML IV SOLN
500.0000 [IU] | Freq: Once | INTRAVENOUS | Status: AC
  Administered 2017-11-06: 500 [IU] via INTRAVENOUS

## 2017-11-06 MED ORDER — SODIUM CHLORIDE 0.9% FLUSH
10.0000 mL | Freq: Once | INTRAVENOUS | Status: AC
  Administered 2017-11-06: 10 mL via INTRAVENOUS
  Filled 2017-11-06: qty 10

## 2017-11-06 NOTE — Progress Notes (Signed)
No blood or platelets today per Dr. Rogue Bussing.  Patient is to return to the clinic on March 8th, per Dr. Rogue Bussing.

## 2017-11-14 ENCOUNTER — Inpatient Hospital Stay: Payer: Medicare Other

## 2017-11-14 ENCOUNTER — Inpatient Hospital Stay (HOSPITAL_BASED_OUTPATIENT_CLINIC_OR_DEPARTMENT_OTHER): Payer: Medicare Other | Admitting: Internal Medicine

## 2017-11-14 ENCOUNTER — Ambulatory Visit
Admission: RE | Admit: 2017-11-14 | Discharge: 2017-11-14 | Disposition: A | Discharge status: Home / Self Care | Payer: Medicare Other | Source: Ambulatory Visit | Attending: Internal Medicine | Admitting: Internal Medicine

## 2017-11-14 ENCOUNTER — Other Ambulatory Visit: Payer: Self-pay

## 2017-11-14 ENCOUNTER — Inpatient Hospital Stay: Payer: Medicare Other | Attending: Internal Medicine | Admitting: *Deleted

## 2017-11-14 VITALS — BP 150/72 | HR 73 | Temp 97.7°F | Resp 16 | Wt 225.2 lb

## 2017-11-14 DIAGNOSIS — M546 Pain in thoracic spine: Secondary | ICD-10-CM

## 2017-11-14 DIAGNOSIS — R350 Frequency of micturition: Secondary | ICD-10-CM | POA: Diagnosis not present

## 2017-11-14 DIAGNOSIS — M5134 Other intervertebral disc degeneration, thoracic region: Secondary | ICD-10-CM | POA: Insufficient documentation

## 2017-11-14 DIAGNOSIS — C9200 Acute myeloblastic leukemia, not having achieved remission: Secondary | ICD-10-CM | POA: Insufficient documentation

## 2017-11-14 DIAGNOSIS — N183 Chronic kidney disease, stage 3 (moderate): Secondary | ICD-10-CM | POA: Insufficient documentation

## 2017-11-14 DIAGNOSIS — Z95828 Presence of other vascular implants and grafts: Secondary | ICD-10-CM

## 2017-11-14 DIAGNOSIS — Z87891 Personal history of nicotine dependence: Secondary | ICD-10-CM | POA: Insufficient documentation

## 2017-11-14 DIAGNOSIS — I129 Hypertensive chronic kidney disease with stage 1 through stage 4 chronic kidney disease, or unspecified chronic kidney disease: Secondary | ICD-10-CM | POA: Diagnosis not present

## 2017-11-14 LAB — CBC WITH DIFFERENTIAL/PLATELET
BASOS ABS: 0 10*3/uL (ref 0–0.1)
Basophils Relative: 1 %
EOS ABS: 0 10*3/uL (ref 0–0.7)
EOS PCT: 0 %
HCT: 29.3 % — ABNORMAL LOW (ref 35.0–47.0)
Hemoglobin: 10.1 g/dL — ABNORMAL LOW (ref 12.0–16.0)
Lymphocytes Relative: 33 %
Lymphs Abs: 1 10*3/uL (ref 1.0–3.6)
MCH: 34.5 pg — AB (ref 26.0–34.0)
MCHC: 34.4 g/dL (ref 32.0–36.0)
MCV: 100.5 fL — ABNORMAL HIGH (ref 80.0–100.0)
Monocytes Absolute: 0.3 10*3/uL (ref 0.2–0.9)
Monocytes Relative: 11 %
Neutro Abs: 1.7 10*3/uL (ref 1.4–6.5)
Neutrophils Relative %: 55 %
PLATELETS: 16 10*3/uL — AB (ref 150–400)
RBC: 2.92 MIL/uL — AB (ref 3.80–5.20)
RDW: 15.9 % — ABNORMAL HIGH (ref 11.5–14.5)
WBC: 3.1 10*3/uL — ABNORMAL LOW (ref 3.6–11.0)

## 2017-11-14 LAB — URINALYSIS, COMPLETE (UACMP) WITH MICROSCOPIC
BILIRUBIN URINE: NEGATIVE
Bacteria, UA: NONE SEEN
Glucose, UA: NEGATIVE mg/dL
Ketones, ur: NEGATIVE mg/dL
LEUKOCYTES UA: NEGATIVE
Nitrite: NEGATIVE
PH: 5 (ref 5.0–8.0)
Protein, ur: NEGATIVE mg/dL
SPECIFIC GRAVITY, URINE: 1.009 (ref 1.005–1.030)
SQUAMOUS EPITHELIAL / LPF: NONE SEEN

## 2017-11-14 LAB — BASIC METABOLIC PANEL
Anion gap: 6 (ref 5–15)
BUN: 30 mg/dL — ABNORMAL HIGH (ref 6–20)
CALCIUM: 8.9 mg/dL (ref 8.9–10.3)
CO2: 26 mmol/L (ref 22–32)
CREATININE: 1.05 mg/dL — AB (ref 0.44–1.00)
Chloride: 108 mmol/L (ref 101–111)
GFR calc Af Amer: 58 mL/min — ABNORMAL LOW (ref >60)
GFR, EST NON AFRICAN AMERICAN: 50 mL/min — AB (ref >60)
Glucose, Bld: 140 mg/dL — ABNORMAL HIGH (ref 65–99)
Potassium: 4 mmol/L (ref 3.5–5.1)
SODIUM: 140 mmol/L (ref 135–145)

## 2017-11-14 LAB — SAMPLE TO BLOOD BANK

## 2017-11-14 MED ORDER — SODIUM CHLORIDE 0.9% FLUSH
10.0000 mL | INTRAVENOUS | Status: DC | PRN
  Administered 2017-11-14: 10 mL via INTRAVENOUS
  Filled 2017-11-14: qty 10

## 2017-11-14 MED ORDER — HEPARIN SOD (PORK) LOCK FLUSH 100 UNIT/ML IV SOLN
500.0000 [IU] | Freq: Once | INTRAVENOUS | Status: AC
  Administered 2017-11-14: 500 [IU] via INTRAVENOUS

## 2017-11-14 NOTE — Progress Notes (Signed)
Per Judeth Horn CMA per MD, no need for blood or platelet transfusion at this time. Pt port de-assessed in lab per [facility].

## 2017-11-14 NOTE — Progress Notes (Signed)
[place] NOTE  Patient Care Team: Lada, Satira Anis, [name] Cammie Sickle, MD as Consulting Physician (Internal Medicine) Gloriann Loan, MD as Attending Physician Erby Pian, MD as Referring Physician (Specialist) Yolonda Kida, MD as Consulting Physician (Cardiology) Albertine Patricia, [place] as Attending Physician (Podiatry) Lavonia Dana, MD as Consulting Physician (Nephrology)  CHIEF COMPLAINTS/PURPOSE OF CONSULTATION:   Oncology History   #JUNE 2017- Severe neutropenia/ Anemia ~hb 9/platlets- 85-100 AUG 2017- REFRACTORY ANEMIA with EXCESS BLASTS [14% blasts- BMBx]; cytogenetics/FISH-N [SNP micorarray- not done]   # AUG 21st 2017-  START AZA 12m/m2 day- 1-7 q 28 days x4 cycles; DEC 6th- BMBx- <5% blasts; hypercellular with dysplasia.   # SEP 20th 2018- ACUTE MYELOID LEUKEMIA [38% blasts on BMBx]; IDH-MUTATION PRESENT; low FLT-3; OCT 18th 2018- ENASIDENIB [Dr.Foster; [facility]]   # CKD stage III  ------------------------------------------------------   MOLECULAR TESTING: NGS sent 06/2017 -FLT3-ITD <0.05 mutational burden -IDH2 -DNMT3A --treatment options include FLT3 inhibition and IDH2 inhibition; given low level FGYJ8-HUDallelic ratio [admittedly in peripheral blood, not bone marrow]- started on ENASIDENIB     MDS (myelodysplastic syndrome), high grade ([facility]   04/23/2016 Initial Diagnosis    MDS (myelodysplastic syndrome), high grade (HCC)       Acute myeloid leukemia not having achieved remission ([facility]    HISTORY OF PRESENTING ILLNESS:  Teresa 89.o.  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  Patient complains of pain in her low back over the last 3 weeks.  Progressive getting worse; worse with movement..  Also noted to have increased frequency of urination.  Patient was recently treated with  ciprofloxacin by nephrology.   Denies any fevers or chills.  No nausea vomiting or diarrhea.  Denies any unusual weight gain or weight loss.  No swelling in the legs.  Patient has intermittent gum bleeding.  Otherwise no nosebleeds.  Patient's last blood transfusion was approximately 2 months ago.  ROS: A complete 10 point review of system is done which is negative except mentioned above in history of present illness.  MEDICAL HISTORY:  Past Medical History:  Diagnosis Date  . Abdominal wall mass   . Anginal pain ([facility]   . Anxiety   . Arthritis   . Calculus of kidney   . Cystitis   . Depression   . Diabetes mellitus without complication (HCC)    elevated A1c  . Dyspnea on exertion   . Elevated serum creatinine   . Fibrocystic breast disease   . GERD (gastroesophageal reflux disease)   . Hearing loss   . Heart murmur   . HTN (hypertension)   . Hyperlipidemia   . MDS (myelodysplastic syndrome), high grade ([facility] 04/23/2016  . Microscopic hematuria   . Mouth sores   . Mucositis due to chemotherapy   . Obesity   . Obesity, morbid ([facility] 09/24/2017   BMI > 35 with type 2 diabetes  . Pneumonia   . Risk for falls   . Sleep apnea   . Thrombocytopenia ([facility]   . Urinary frequency   . Urinary urgency     SURGICAL HISTORY: Past Surgical History:  Procedure Laterality Date  . ABDOMINAL HYSTERECTOMY    . APPENDECTOMY    . CARDIAC CATHETERIZATION     x2  . CHOLECYSTECTOMY    . COLONOSCOPY N/A 02/24/2015   Procedure: COLONOSCOPY;  Surgeon: RHerbie BaltimoreT  Vira Agar, MD;  Location: [facility];  Service: Endoscopy;  Laterality: N/A;  . DIAGNOSTIC LAPAROSCOPY     Removal of benign abdominal tumor  . ESOPHAGOGASTRODUODENOSCOPY N/A 02/24/2015   Procedure: ESOPHAGOGASTRODUODENOSCOPY (EGD);  Surgeon: Manya Silvas, MD;  Location: [facility] ENDOSCOPY;  Service: Endoscopy;  Laterality: N/A;  . IR FLUORO GUIDE PORT INSERTION LEFT  10/09/2017  . right eye surgery Right     SOCIAL HISTORY: lives  with family; snow[employer]; [employer] in [place] retd. No smoking/ no alcohol.  Social History   Socioeconomic History  . Marital status: Married    Spouse name: Not on file  . Number of children: Not on file  . Years of education: Not on file  . Highest education level: Not on file  Social Needs  . Financial resource strain: Not on file  . Food insecurity - worry: Not on file  . Food insecurity - inability: Not on file  . Transportation needs - medical: Not on file  . Transportation needs - non-medical: Not on file  Occupational History  . Not on file  Tobacco Use  . Smoking status: Former Smoker    Types: Cigarettes    Last attempt to quit: 09/09/1988    Years since quitting: 29.2  . Smokeless tobacco: Never Used  . Tobacco comment: quit 25 years ago  Substance and Sexual Activity  . Alcohol use: No    Alcohol/week: 0.0 oz  . Drug use: No  . Sexual activity: Not Currently    Birth control/protection: Post-menopausal  Other Topics Concern  . Not on file  Social History Narrative  . Not on file    FAMILY HISTORY: no cancers in family.  Family History  Problem Relation Age of Onset  . Congestive Heart Failure Mother   . Diabetes Mother   . Coronary artery disease Mother   . Stroke Mother   . Cirrhosis Father   . Diabetes Brother     ALLERGIES:  is allergic to macrobid [nitrofurantoin monohyd macro].  MEDICATIONS:  Current Outpatient Medications  Medication Sig Dispense Refill  . acyclovir (ZOVIRAX) 400 MG tablet Take 400 mg by mouth 2 (two) times daily.    Marland Kitchen acyclovir (ZOVIRAX) 400 MG tablet TAKE 1 TABLET(400 MG) BY MOUTH TWICE DAILY 180 tablet 0  . albuterol (PROVENTIL HFA;VENTOLIN HFA) 108 (90 Base) MCG/ACT inhaler Inhale 2 puffs into the lungs every 6 (six) hours as needed for wheezing or shortness of breath. 1 Inhaler 2  . buPROPion (WELLBUTRIN XL) 150 MG 24 hr tablet Take 300 mg by mouth daily at 12 noon.     . ciprofloxacin (CIPRO) 500 MG tablet     . clonazePAM  (KLONOPIN) 0.5 MG tablet Take 0.5 mg by mouth 2 (two) times daily.     Marland Kitchen diltiazem (CARDIZEM CD) 180 MG 24 hr capsule Take 1 capsule (180 mg total) by mouth daily. 30 capsule 0  . IDHIFA 100 MG TABS Take 100 mg by mouth daily.    Marland Kitchen lidocaine-prilocaine (EMLA) cream Apply 1 application topically as needed. Apply small amount to port site at least 1 hour prior to it being accessed, cover with plastic wrap 30 g 1  . ondansetron (ZOFRAN-ODT) 4 MG disintegrating tablet Take 4 mg by mouth every 8 (eight) hours as needed for nausea or vomiting.    . prochlorperazine (COMPAZINE) 10 MG tablet Take 1 tablet (10 mg total) by mouth every 6 (six) hours as needed (Nausea or vomiting). 30 tablet 1  . sertraline (ZOLOFT) 100  MG tablet Take 100 mg by mouth daily at 12 noon.     . tranexamic acid (LYSTEDA) 650 MG TABS tablet Take 2 tablets by mouth as needed for bleeding. Taking for bleeding gums    . umeclidinium-vilanterol (ANORO ELLIPTA) 62.5-25 MCG/INH AEPB Inhale 1 puff into the lungs as needed.     No current facility-administered medications for this visit.       Marland Kitchen  PHYSICAL EXAMINATION: ECOG PERFORMANCE STATUS: 1 - Symptomatic but completely ambulatory  Vitals:   11/14/17 0905 11/14/17 1015  BP: (!) 150/72   Pulse: 64 73  Resp:  16  Temp:  97.7 F (36.5 C)   Filed Weights   11/14/17 1015  Weight: 225 lb 3.2 oz (102.2 kg)    GENERAL: Well-nourished well-developed; Alert, no distress and comfortable.   Obese. Accompanied by her husband. She is walking by herself. EYES: Positive for pallor. OROPHARYNX: no thrush or ulceration;  NECK: supple, no masses felt LYMPH:  no palpable lymphadenopathy in the cervical, axillary or inguinal regions LUNGS: Decreased breath sounds to auscultation and  No wheeze or crackles HEART/CVS: regular rate & rhythm and no murmurs; No lower extremity edema ABDOMEN: abdomen soft, non-tender and normal bowel sounds; possible splenomegaly. Musculoskeletal:no cyanosis  of digits and no clubbing  PSYCH: alert & oriented x 3 with fluent speech NEURO: no focal motor/sensory deficits SKIN:  No skin rash/nodules.  LABORATORY DATA:  I have reviewed the data as listed Lab Results  Component Value Date   WBC 3.1 (L) 11/14/2017   HGB 10.1 (L) 11/14/2017   HCT 29.3 (L) 11/14/2017   MCV 100.5 (H) 11/14/2017   PLT 16 (LL) 11/14/2017   Recent Labs    07/17/17 1229  08/11/17 0948  08/25/17 0938  10/01/17 0811  10/30/17 1327 11/03/17 0916 11/14/17 0848  NA 137   < > 141   < > 140   < > 139   < > 140 141 140  K 3.6   < > 4.0   < > 4.3   < > 4.1   < > 4.1 3.8 4.0  CL 103   < > 104   < > 105   < > 106   < > 108 108 108  CO2 24   < > 24   < > 26   < > 27   < > _0 GLUCOSE 114*   < > 132*   < > 136*   < > 120*   < > 121* 135* 140*  BUN 16   < > 16   < > 17   < > 24*   < > 19 26* 30*  CREATININE 1.37*   < > 1.23*   < > 1.28*   < > 1.24*   < > 1.03* 1.23* 1.05*  CALCIUM 9.2   < > 9.1   < > 9.0   < > 8.8*   < > 8.9 8.8* 8.9  GFRNONAA 37*   < > 42*   < > 40*   < > 41*   < > 51* 42* 50*  GFRAA 43*   < > 48*   < > 46*   < > 48*   < > 60* 48* 58*  PROT 8.0   < > 7.4  --  6.9  --  6.7  --   --   --   --   ALBUMIN 3.6   < > 3.8  --  3.8  --  3.9  --   --   --   --   AST 29   < > 20  --  24  --  16  --   --   --   --   ALT 19   < > 15  --  27  --  13*  --   --   --   --   ALKPHOS 77   < > 68  --  62  --  63  --   --   --   --   BILITOT 1.2   < > 1.1  --  1.1  --  1.1  --   --   --   --   BILIDIR 0.1  --   --   --   --   --   --   --   --   --   --   IBILI 1.1*  --   --   --   --   --   --   --   --   --   --    < > = values in this interval not displayed.    RADIOGRAPHIC STUDIES: I have personally reviewed the radiological images as listed and agreed with the findings in the report. Dg Thoracic Spine 2 View  Result Date: 11/14/2017 CLINICAL DATA:  Acute upper back pain without known injury. EXAM: THORACIC SPINE 2 VIEWS COMPARISON:  CT scan of August 25, 2017. FINDINGS: No fracture or spondylolisthesis is noted. Anterior osteophyte formation is noted throughout the mid and lower thoracic spine. Disc spaces are well-maintained. IMPRESSION: Degenerative changes as described above. No acute abnormality seen in the thoracic spine. Electronically Signed   By: Marijo Conception, M.D.   On: 11/14/2017 11:55   IMPRESSION: 1. Mid and lower lung zone peribronchovascular ground-glass, nodularity and consolidation, likely due to an infectious bronchiolitis/bronchopneumonia. 2. Upper abdominal adenopathy, as on 07/17/2017. 3. Aortic atherosclerosis (ICD10-170.0). Moderate coronary artery calcification.   Electronically Signed   By: Lorin Picket M.D.   On: 08/25/2017 11:57 ASSESSMENT & PLAN:   Acute myeloid leukemia not having achieved remission ([place]) AML - progressed from MDS- 12 cycles azacitadine; progressed to AML w/ 38% blasts- 05/2017. Repeat B[facility]x 11/12 at [facility] shows increased in blasts 35% to >60% per report. Currently on enasidenib (started 06/28/17).  # partial response- mostly improvement/stability of white count/hb; still needing ~ 1 platelets ~ 1-2week.  Patient's last blood transfusion was approximately 2 months ago.  Given the clinical benefit-continue Enasidenib.   # low back pain/ radiating bil [1 week ago]- ? UA/cultures; [started cipro 3 days]; worse with movements; check X-rays.   # AKI/CKD - stage III.stable.   Follow up in 3 weeks; bi- weekly labs [Monday/thursday]; UA/culter.  Addendum: X-rays of the lower back within normal limits.  UA negative for infection.      Cammie Sickle, MD 11/17/2017 1:24 PM

## 2017-11-14 NOTE — Assessment & Plan Note (Addendum)
AML - progressed from MDS- 12 cycles azacitadine; progressed to AML w/ 38% blasts- 05/2017. Repeat BMBx 11/12 at [facility] shows increased in blasts 35% to >60% per report. Currently on enasidenib (started 06/28/17).  # partial response- mostly improvement/stability of white count/hb; still needing ~ 1 platelets ~ 1-2week.  Patient's last blood transfusion was approximately 2 months ago.  Given the clinical benefit-continue Enasidenib.   # low back pain/ radiating bil [1 week ago]- ? UA/cultures; [started cipro 3 days]; worse with movements; check X-rays.   # AKI/CKD - stage III.stable.   Follow up in 3 weeks; bi- weekly labs [Monday/thursday]; UA/culter.  Addendum: X-rays of the lower back within normal limits.  UA negative for infection.

## 2017-11-15 LAB — URINE CULTURE: Culture: NO GROWTH

## 2017-11-17 ENCOUNTER — Other Ambulatory Visit: Admit: 2017-11-17 | Discharge: 2017-11-17 | Payer: MEDICARE

## 2017-11-17 ENCOUNTER — Encounter: Admit: 2017-11-17 | Discharge: 2017-11-18 | Payer: MEDICARE | Attending: General Practice | Primary: General Practice

## 2017-11-17 ENCOUNTER — Encounter: Admit: 2017-11-17 | Discharge: 2017-11-17 | Payer: MEDICARE

## 2017-11-17 DIAGNOSIS — C9200 Acute myeloblastic leukemia, not having achieved remission: Principal | ICD-10-CM

## 2017-11-17 DIAGNOSIS — Z0120 Encounter for dental examination and cleaning without abnormal findings: Secondary | ICD-10-CM

## 2017-11-17 DIAGNOSIS — R350 Frequency of micturition: Principal | ICD-10-CM

## 2017-11-17 LAB — COMPREHENSIVE METABOLIC PANEL
ALBUMIN: 4.1 g/dL (ref 3.5–5.0)
ALKALINE PHOSPHATASE: 62 U/L (ref 38–126)
ALT (SGPT): 14 U/L — ABNORMAL LOW (ref 15–48)
ANION GAP: 9 mmol/L (ref 9–15)
AST (SGOT): 15 U/L (ref 14–38)
BILIRUBIN TOTAL: 0.9 mg/dL (ref 0.0–1.2)
BLOOD UREA NITROGEN: 24 mg/dL — ABNORMAL HIGH (ref 7–21)
BUN / CREAT RATIO: 22
CALCIUM: 9 mg/dL (ref 8.5–10.2)
CO2: 26 mmol/L (ref 22.0–30.0)
CREATININE: 1.09 mg/dL — ABNORMAL HIGH (ref 0.60–1.00)
EGFR MDRD AF AMER: 59 mL/min/{1.73_m2} — ABNORMAL LOW (ref >=60)
EGFR MDRD NON AF AMER: 49 mL/min/{1.73_m2} — ABNORMAL LOW (ref >=60)
GLUCOSE RANDOM: 105 mg/dL (ref 65–179)
POTASSIUM: 3.9 mmol/L (ref 3.5–5.0)
PROTEIN TOTAL: 6.4 g/dL — ABNORMAL LOW (ref 6.5–8.3)
SODIUM: 142 mmol/L (ref 135–145)

## 2017-11-17 LAB — CBC W/ AUTO DIFF
BASOPHILS ABSOLUTE COUNT: 0 10*9/L (ref 0.0–0.1)
BASOPHILS RELATIVE PERCENT: 0.4 %
EOSINOPHILS ABSOLUTE COUNT: 0 10*9/L (ref 0.0–0.4)
HEMATOCRIT: 32.1 % — ABNORMAL LOW (ref 36.0–46.0)
HEMOGLOBIN: 10.7 g/dL — ABNORMAL LOW (ref 12.0–16.0)
LARGE UNSTAINED CELLS: 5 % — ABNORMAL HIGH (ref 0–4)
LYMPHOCYTES ABSOLUTE COUNT: 0.7 10*9/L — ABNORMAL LOW (ref 1.5–5.0)
LYMPHOCYTES RELATIVE PERCENT: 25.7 %
MEAN CORPUSCULAR HEMOGLOBIN CONC: 33.2 g/dL (ref 31.0–37.0)
MEAN CORPUSCULAR HEMOGLOBIN: 33.8 pg (ref 26.0–34.0)
MEAN CORPUSCULAR VOLUME: 101.8 fL — ABNORMAL HIGH (ref 80.0–100.0)
MEAN PLATELET VOLUME: 7.9 fL (ref 7.0–10.0)
MONOCYTES ABSOLUTE COUNT: 0.2 10*9/L (ref 0.2–0.8)
MONOCYTES RELATIVE PERCENT: 6.1 %
NEUTROPHILS ABSOLUTE COUNT: 1.6 10*9/L — ABNORMAL LOW (ref 2.0–7.5)
NEUTROPHILS RELATIVE PERCENT: 62.1 %
RED BLOOD CELL COUNT: 3.16 10*12/L — ABNORMAL LOW (ref 4.00–5.20)
RED CELL DISTRIBUTION WIDTH: 18.2 % — ABNORMAL HIGH (ref 12.0–15.0)
WBC ADJUSTED: 2.6 10*9/L — ABNORMAL LOW (ref 4.5–11.0)

## 2017-11-17 LAB — URINALYSIS WITH CULTURE REFLEX
BACTERIA: NONE SEEN /HPF
BILIRUBIN UA: NEGATIVE
GLUCOSE UA: NEGATIVE
HYALINE CASTS: 1 /LPF (ref 0–1)
KETONES UA: NEGATIVE
LEUKOCYTE ESTERASE UA: NEGATIVE
NITRITE UA: NEGATIVE
PROTEIN UA: NEGATIVE
RBC UA: 2 /HPF (ref <=4)
SPECIFIC GRAVITY UA: 1.009 (ref 1.003–1.030)
SQUAMOUS EPITHELIAL: <1 /HPF (ref 0–5)
TRANSITIONAL EPITHELIAL: <1 /HPF (ref 0–2)
UROBILINOGEN UA: 0.2
WBC UA: <1 /HPF (ref 0–5)

## 2017-11-17 LAB — SMEAR REVIEW

## 2017-11-17 LAB — RBC UA: Lab: 2

## 2017-11-17 LAB — CO2: Carbon dioxide:SCnc:Pt:Ser/Plas:Qn:: 26

## 2017-11-17 LAB — BASOPHILS RELATIVE PERCENT: Lab: 0.4

## 2017-11-17 MED ORDER — CHLORHEXIDINE GLUCONATE 0.12 % MOUTHWASH
Freq: Two times a day (BID) | OROMUCOSAL | 4 refills | 0.00000 days | Status: CP
Start: 2017-11-17 — End: ?

## 2017-11-17 MED ORDER — DOXYCYCLINE HYCLATE 20 MG TABLET
ORAL_TABLET | Freq: Two times a day (BID) | ORAL | 0 refills | 0.00000 days | Status: CP
Start: 2017-11-17 — End: 2018-02-18

## 2017-11-17 NOTE — Unmapped (Signed)
Labs drawn and sent for analysis.  Care provided by  D Maxwell, RN

## 2017-11-17 NOTE — Unmapped (Signed)
This pleasant 77 y.o.  yr old female/female  presents to [facility] for  post- chemotherapy evaluation.      Pt referred by Dr. Malen Gauze at [facility] oncology    CC  'My teeth are so loose, its hard to eat and my gums are coming away from my teeth'      HPI  Pt was initially diagnosed with MDS 04/23/2016, she underwent a chemo regimen.  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 She also developed pneumononia Nov 2018.    Since initiating the new chemo regimen she has noticed an improved quality of life, as prior to this she was almost bedridden 2017-18 and was uanble to function.  She is doing better and today her hemoglobin is now 10.9 and her platelets are 12. She recieves platelets 1x per week and her  range over the past year has been 10-50.  Today she was told by her physician that her kidney function has improved. She has recently had spontaneous oral bleeding.    Reviewed medical history and surgical history  Past Medical History:   Diagnosis Date   ??? Anxiety disorder    ??? Chronic kidney disease    ??? Depression    ??? Diabetes mellitus without complication (CMS-HCC)    ??? Fibrocystic breast    ??? GERD (gastroesophageal reflux disease)    ??? Hyperlipidemia    ??? Hypertension    ??? MDS (myelodysplastic syndrome), high grade (CMS-HCC) 04/17/2016   ??? Nephrolithiasis    ??? Obesity    ??? Sleep apnea      Past Surgical History:   Procedure Laterality Date   ??? CARDIAC CATHETERIZATION     ??? CHOLECYSTECTOMY  02/24/2015   ??? DIAGNOSTIC LAPAROSCOPY      removal of benign abdominal tumor   ??? EXCISION / BIOPSY BREAST / NIPPLE / DUCT Right Age 63   ??? HYSTERECTOMY  age 24   no reflux  Eye surgery 7 years ago    Past Dental Hx  Pt was seen every 6 months over the past 50 or more years.  She shares that her oncologist  in [place] suggested that she not come to the dentist. Dr. Malen Gauze here at [facility] referred her to the [facility]    Meds  Current Outpatient Prescriptions   Medication Sig Dispense Refill   ??? acetaminophen (TYLENOL) 325 MG tablet Take 650 mg by mouth every six (6) hours as needed for pain.     ??? acyclovir (ZOVIRAX) 400 MG tablet Take 400 mg by mouth Two (2) times a day.      ??? buPROPion (WELLBUTRIN SR) 150 MG 12 hr tablet 2 tabs po daily  1   ??? clonazePAM (KLONOPIN) 0.5 MG tablet Take 1 mg by mouth two (2) times a day as needed for anxiety.      ??? diltiazem (CARDIZEM CD) 180 MG 24 hr capsule Take 180 mg by mouth daily.     ??? enasidenib (IDHIFA) 100 mg tablet Take 100 mg by mouth daily.     ??? lidocaine-prilocaine (EMLA) cream   1   ??? ondansetron (ZOFRAN) 4 MG tablet Take 4 mg by mouth every eight (8) hours as needed for nausea.     ??? prochlorperazine (COMPAZINE) 10 MG tablet Take 10 mg by mouth every six (6) hours as needed for nausea.     ??? sertraline (ZOLOFT) 100 MG tablet Take 150 mg by mouth daily.      ???  tranexamic acid (LYSTEDA) 650 mg Tab tablet Take 2 tabs daily as needed for bleeding 60 tablet 2   ??? umeclidinium-vilanterol (ANORO ELLIPTA) 62.5-25 mcg/actuation inhaler Inhale 1 puff daily.       No current facility-administered medications for this visit.        Habit Hx:   reports that she quit smoking about 29 years ago. Her smoking use included Cigarettes. She quit smokeless tobacco use about 26 years ago. She reports that she does not drink alcohol or use drugs.    Vitals  Vitals:    11/17/17 1339   BP: 188/75   Pulse: 64   SpO2: 97%       Allergies  Macrobid [nitrofurantoin monohyd/m-cryst]    Exam:  Radiographic  Panorex and PA's taken reveal significant generalized horizontal bone loss, several missing teeth and no bony pathology.               Extraoral  Thyroid midline (+), cervical region(+), sinus (-), facial assymetry (+), TMJ(-), eye lid lag    Intraoral    Soft tissue   Mucosal changes- pale tissues associated with anemia,     Oral cancer examination detects no suspicious oral lesions   Mod-heavy Plaque, calculus, gingiva edematous, erythematous and pulls away from teeth.    No Probing because of platelet count of 12    Hard tissue   -Missing 1,5,13,16-19,30-32   -Restored , 4PFM, 7L resin, 8L resin, 9DL resin10L resin, 12-14 PFM bridge, 15DOamal   -Caries- none    Assessment    Aggressive periodontal disease in the setting of immunosuppression       Plan-A-Arrest and stabilize periodontal disease   1) Today removed supragingival deposits only given low platelet count, Chorhexidine irrigation   2)OHI and demonstration provided with interproximal brush  3)RX- doxycycline and chlorhexidine  4)Will need to coordinate with Dr. Malen Gauze to administer platelets prior to cleaning       Plan B If the above treatment is not effective will need to extract periodontally involved teeth and fabricate RPD      Next Visit  Will return for cleaning

## 2017-11-17 NOTE — Unmapped (Addendum)
Please check with Dr. Donneta Romberg about getting platelets tomorrow.    Looks like you need to continue twice weekly labs.    It sounds like you are tolerating the Idhifa well and it's encouraging to see improvement in your red cells.    We'll see you in four weeks.    I'll call you with the urine results. Please follow up with your kidney doctor about your ongoing lower back discomfort.    Lab on 11/17/2017   Component Date Value Ref Range Status   ??? Sodium 11/17/2017 142  135 - 145 mmol/L Final   ??? Potassium 11/17/2017 3.9  3.5 - 5.0 mmol/L Final   ??? Chloride 11/17/2017 107  98 - 107 mmol/L Final   ??? CO2 11/17/2017 26.0  22.0 - 30.0 mmol/L Final   ??? BUN 11/17/2017 24* 7 - 21 mg/dL Final   ??? Creatinine 11/17/2017 1.09* 0.60 - 1.00 mg/dL Final   ??? BUN/Creatinine Ratio 11/17/2017 22   Final   ??? EGFR MDRD Non Af Amer 11/17/2017 49* >=60 mL/min/1.10m2 Final   ??? EGFR MDRD Af Amer 11/17/2017 59* >=60 mL/min/1.41m2 Final   ??? Anion Gap 11/17/2017 9  9 - 15 mmol/L Final   ??? Glucose 11/17/2017 105  65 - 179 mg/dL Final   ??? Calcium 16/06/9603 9.0  8.5 - 10.2 mg/dL Final   ??? Albumin 54/05/8118 4.1  3.5 - 5.0 g/dL Final   ??? Total Protein 11/17/2017 6.4* 6.5 - 8.3 g/dL Final   ??? Total Bilirubin 11/17/2017 0.9  0.0 - 1.2 mg/dL Final   ??? AST 14/78/2956 15  14 - 38 U/L Final   ??? ALT 11/17/2017 14* 15 - 48 U/L Final   ??? Alkaline Phosphatase 11/17/2017 62  38 - 126 U/L Final   ??? WBC 11/17/2017 2.6* 4.5 - 11.0 10*9/L Preliminary   ??? RBC 11/17/2017 3.16* 4.00 - 5.20 10*12/L Preliminary   ??? HGB 11/17/2017 10.7* 12.0 - 16.0 g/dL Preliminary   ??? HCT 11/17/2017 32.1* 36.0 - 46.0 % Preliminary   ??? MCV 11/17/2017 101.8* 80.0 - 100.0 fL Preliminary   ??? MCH 11/17/2017 33.8  26.0 - 34.0 pg Preliminary   ??? MCHC 11/17/2017 33.2  31.0 - 37.0 g/dL Preliminary   ??? RDW 11/17/2017 18.2* 12.0 - 15.0 % Preliminary   ??? MPV 11/17/2017 7.9  7.0 - 10.0 fL Preliminary   ??? Platelet 11/17/2017 12* 150 - 440 10*9/L Preliminary   ??? Variable HGB Concentration 11/17/2017 Slight* Not Present Preliminary   ??? Neutrophil Left Shift 11/17/2017 1+* Not Present Preliminary   ??? Macrocytosis 11/17/2017 Marked* Not Present Preliminary   ??? Anisocytosis 11/17/2017 Moderate* Not Present Preliminary   ??? Hypochromasia 11/17/2017 Slight* Not Present Preliminary

## 2017-11-19 NOTE — Unmapped (Signed)
[facility] Refill Coordination Note  Specialty Medication(s): IDHIFA 100mg       Jessica Wilson, DOB: 09/06/1941  Phone: [phone] (home) [phone] (work), Alternate phone contact: N/A  Phone or address changes today?: No  All above HIPAA information was verified with patient.  Shipping Address: [address]  [place] [address]   Insurance changes? No    Completed refill call assessment today to schedule patient's medication shipment from the [facility] [phone]).      Confirmed the medication and dosage are correct and have not changed: Yes, regimen is correct and unchanged.    Confirmed patient started or stopped the following medications in the past month:  No, there are no changes reported at this time.    Are you tolerating your medication?:  Jessica Wilson reports tolerating the medication.    ADHERENCE    Is this medicine transplant or covered by Medicare Part B? No.        Did you miss any doses in the past 4 weeks? No missed doses reported.    FINANCIAL/SHIPPING    Delivery Scheduled: Yes, Expected medication delivery date: 11/26/17     The patient will receive an FSI print out for each medication shipped and additional FDA Medication Guides as required.  Patient education from [place] or Robet Leu may also be included in the shipment.    Jessica Wilson did not have any additional questions at this time.    Delivery address validated in FSI scheduling system: Yes, address listed in FSI is correct.    We will follow up with patient monthly for standard refill processing and delivery.      Thank you,  [employer]   [facility] Specialty Pharmacist

## 2017-11-21 ENCOUNTER — Telehealth: Payer: Self-pay | Admitting: *Deleted

## 2017-11-21 ENCOUNTER — Inpatient Hospital Stay: Payer: Medicare Other

## 2017-11-21 ENCOUNTER — Inpatient Hospital Stay: Payer: Medicare Other | Admitting: *Deleted

## 2017-11-21 DIAGNOSIS — Z95828 Presence of other vascular implants and grafts: Secondary | ICD-10-CM

## 2017-11-21 DIAGNOSIS — C9200 Acute myeloblastic leukemia, not having achieved remission: Secondary | ICD-10-CM | POA: Diagnosis not present

## 2017-11-21 LAB — CBC WITH DIFFERENTIAL/PLATELET
BASOS ABS: 0.1 10*3/uL (ref 0–0.1)
Basophils Relative: 3 %
EOS ABS: 0 10*3/uL (ref 0–0.7)
Eosinophils Relative: 0 %
HCT: 28.2 % — ABNORMAL LOW (ref 35.0–47.0)
HEMOGLOBIN: 9.7 g/dL — AB (ref 12.0–16.0)
LYMPHS ABS: 0.7 10*3/uL — AB (ref 1.0–3.6)
Lymphocytes Relative: 25 %
MCH: 34.5 pg — ABNORMAL HIGH (ref 26.0–34.0)
MCHC: 34.5 g/dL (ref 32.0–36.0)
MCV: 100 fL (ref 80.0–100.0)
MONO ABS: 0.3 10*3/uL (ref 0.2–0.9)
Monocytes Relative: 11 %
NEUTROS PCT: 61 %
Neutro Abs: 1.5 10*3/uL (ref 1.4–6.5)
PLATELETS: 16 10*3/uL — AB (ref 150–400)
RBC: 2.82 MIL/uL — AB (ref 3.80–5.20)
RDW: 15.7 % — ABNORMAL HIGH (ref 11.5–14.5)
WBC: 2.6 10*3/uL — AB (ref 3.6–11.0)

## 2017-11-21 LAB — BASIC METABOLIC PANEL
Anion gap: 7 (ref 5–15)
BUN: 28 mg/dL — AB (ref 6–20)
CALCIUM: 8.6 mg/dL — AB (ref 8.9–10.3)
CO2: 20 mmol/L — ABNORMAL LOW (ref 22–32)
CREATININE: 1.12 mg/dL — AB (ref 0.44–1.00)
Chloride: 111 mmol/L (ref 101–111)
GFR, EST AFRICAN AMERICAN: 54 mL/min — AB (ref >60)
GFR, EST NON AFRICAN AMERICAN: 46 mL/min — AB (ref >60)
Glucose, Bld: 112 mg/dL — ABNORMAL HIGH (ref 65–99)
Potassium: 4 mmol/L (ref 3.5–5.1)
Sodium: 138 mmol/L (ref 135–145)

## 2017-11-21 LAB — SAMPLE TO BLOOD BANK

## 2017-11-21 MED ORDER — HEPARIN SOD (PORK) LOCK FLUSH 100 UNIT/ML IV SOLN
500.0000 [IU] | Freq: Once | INTRAVENOUS | Status: AC
  Administered 2017-11-21: 500 [IU] via INTRAVENOUS

## 2017-11-21 MED ORDER — SODIUM CHLORIDE 0.9% FLUSH
10.0000 mL | INTRAVENOUS | Status: DC | PRN
  Administered 2017-11-21: 10 mL via INTRAVENOUS
  Filled 2017-11-21: qty 10

## 2017-11-21 MED ORDER — HEPARIN SOD (PORK) LOCK FLUSH 100 UNIT/ML IV SOLN
INTRAVENOUS | Status: AC
  Filled 2017-11-21: qty 5

## 2017-11-21 NOTE — Telephone Encounter (Signed)
Spoke with Dr. Rogue Bussing- no plt infusion today. Labs are stable. Pt has an apt on Monday for lab recheck and possible infusion on Monday. Pt instructed to keep these apts.

## 2017-11-21 NOTE — Telephone Encounter (Signed)
Called report of platelet count 16 (sixteen) Kim H in infusion informed

## 2017-11-21 NOTE — Progress Notes (Signed)
Per Dr. Rogue Bussing no need for blood or platelet transfusion at this time. Pt denies any concerns at this time. Pt stable at discharge.

## 2017-11-22 NOTE — Unmapped (Signed)
[facility] Leukemia Clinic Follow-up    Patient Name: Jessica Wilson  Patient Age: 77 y.o.  Encounter Date: 11/17/2017    Primary Care Provider:  Kerman Passey, MD    Referring Physician:  Loyal Buba, MD  [address]  [place], [place] [address]    Reason for visit:  Follow up of MDS progressed to AML, on enasidenib    History of Present Illness:  We had the pleasure of seeing Jessica Wilson in the Leukemia Clinic at the [place] [facility] [place] on 11/17/2017.  She is a 77 y.o. 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      Current therapy is enasidenib.    Her oncologic history is as follows:    Oncology History    Referring/Local Oncologist: Dr. Louretta Shorten, [facility]    Diagnosis: MDS with Excess Blasts-2    Genetics:   Karyotype/FISH: 46XX     Molecular Genetics: not performed    Pertinent Phenotypic data: no aberrant immunophenotype by flow cytometry, however blasts stained by IHC for CD117, MPO.  Only 5% marrow cells stained for CD34.    Disease-specific prognostic estimation: IPSS-R high risk, median OS 1.6 years, with 25% AML risk at 1.4 years            MDS (myelodysplastic syndrome), high grade (CMS-HCC)    04/17/2016 Initial Diagnosis     MDS (myelodysplastic syndrome), high grade (RAF-HCC)       04/29/2016 -  Chemotherapy     Azacitidine cycle 1: 75 mg/m2 [place] days 1-7 of 28-day cycles         05/27/2016 -  Chemotherapy     Azacitidine cycle 2: 75 mg/m2 [place] days 1-7 of 28 day cycles         06/24/2016 -  Chemotherapy     Azacitidine cycle 3: 75 mg/m2 [place] days 1-7 of 28 day cycle         07/22/2016 -  Chemotherapy     Azacitidine cycle 4: 75 mg/m2 [place] days 1-7 of 28 day cycle         08/19/2016 -  Chemotherapy     Azacitidine cycle 5: 75 mg/m2 [place] x 7 days of 28 day cycle         09/16/2016 -  Chemotherapy     Azacitidine cycle 6: 75 mbg/m2 [place] x 7 days of 28 day cycle         10/14/2016 -  Chemotherapy     Azacitidine cycle 7: 75 mg/m2 [place] x 7 days of 28 day cycle         12/09/2016 -  Chemotherapy     Azacitidine cycle 8: 60 mg/m2 [place] x 7 days of 28 day (cycle reduced by 20% due to hematologic toxicity)         01/13/2017 -  Chemotherapy     Azacitidine cycle 9: 60 mg/m2 [place] x 5 days of 28 day cycle (reduced by 2 days and 20% per dose due to hematologic toxicity)         05/29/2017 Progression     Given increasing transfusion requirements, repeat bone marrow biopsy done, now with 35% blasts and meets criteria for progression to AML.           Acute myeloid leukemia not having achieved remission (CMS-HCC)    06/02/2017 Initial Diagnosis     Acute myeloid leukemia not having achieved remission  06/28/2017 - 07/17/2017 Chemotherapy     enasidenib 100 mg by mouth daily         07/17/2017 Adverse Reaction     Acute abdominal pain, splenomegaly, AKI , elevated transaminases.  Enasidenib held thru 11/13 and treated empirically for differentiation syndrome with dexamethasone. Resumed enasidenib 07/23/17         07/23/2017 -  Chemotherapy     enasidenib 100 mg PO daily          Interim History:  Since last seen here, [name] has continued on enasidenib.  She is here today with her husband. Overall, she has been doing fairly well with improvement in her functional status. Endorses good appetite. Her weight is down a couple of pounds.    A week ago, she started experiencing back pain near her kidneys as well as urinary frequency. Did not have dysuria. She was put on ciprofloxacin for five days. She still has soreness in her back and wonders if her UTI is still ongoing. Denies fevers. Denies chills. Denies hematuria.    Denies headaches. Denies CP. Denies SOB. Denies abd pain/n/v/d/c. Has mild LE edema. Denies rash.    Otherwise, she denies new constitutional symptoms such as anorexia, weight loss night sweats or unexplained fevers.  Furthermore, she denies symptoms of marrow failure: unexplained bleeding or bruising, recurrent or unexplained intercurrent infections, dyspnea on exertion, lightheadedness, palpitations or chest pain.  There have been no new or unexplained pains or self-identified masses, swelling or enlarged lymph nodes.    Past Medical, Surgical and Family History were reviewed and pertinent updates were made in the Electronic Medical Record    Review of Systems:  Other than as reported above in the interim history, all other systems reviewed were negative.    ECOG Performance Status: 2    Medications:    Current Outpatient Prescriptions   Medication Sig Dispense Refill   ??? acetaminophen (TYLENOL) 325 MG tablet Take 650 mg by mouth every six (6) hours as needed for pain.     ??? acyclovir (ZOVIRAX) 400 MG tablet Take 400 mg by mouth Two (2) times a day.      ??? buPROPion (WELLBUTRIN SR) 150 MG 12 hr tablet 2 tabs po daily  1   ??? clonazePAM (KLONOPIN) 0.5 MG tablet Take 1 mg by mouth two (2) times a day as needed for anxiety.      ??? diltiazem (CARDIZEM CD) 180 MG 24 hr capsule Take 180 mg by mouth daily.     ??? enasidenib (IDHIFA) 100 mg tablet Take 100 mg by mouth daily.     ??? lidocaine-prilocaine (EMLA) cream   1   ??? ondansetron (ZOFRAN) 4 MG tablet Take 4 mg by mouth every eight (8) hours as needed for nausea.     ??? prochlorperazine (COMPAZINE) 10 MG tablet Take 10 mg by mouth every six (6) hours as needed for nausea.     ??? sertraline (ZOLOFT) 100 MG tablet Take 150 mg by mouth daily.      ??? tranexamic acid (LYSTEDA) 650 mg Tab tablet Take 2 tabs daily as needed for bleeding 60 tablet 2   ??? umeclidinium-vilanterol (ANORO ELLIPTA) 62.5-25 mcg/actuation inhaler Inhale 1 puff daily.     ??? chlorhexidine (PERIDEX) 0.12 % solution 15 mL by Mouth route Two (2) times a day. 473 mL 4   ??? doxycycline (PERIOSTAT) 20 MG tablet Take 1 tablet (20 mg total) by mouth Two (2) times a day. 60 tablet 0  No current facility-administered medications for this visit.      Vital Signs:  Vitals:    11/17/17 1033   BP: 145/63   Pulse: 66   Resp: 18   Temp: 36.6 ??C (97.9 ??F) SpO2: 98%     Physical Exam:  General: Resting in no apparent distress  HEENT: Stable disconjugate gaze.  PERRL. No scleral icterus or conjunctival injection.  Oral mucosa without ulceration, erythema, exudate or purpura. Nares show no bleeding.    Lymph node exam:  No lymphadenopathy in the occipital, auricular, anterior/posterior cervical, submental, supraclavicular, axillary basins.  Heart:  Regular rate and rhythm. S1, S2. No murmurs, gallops or rubs.  Lungs:  Breathing is unlabored and patient is speaking full sentences with ease.  No stridor.  Auscultation of lung fields reveals normal air movement without rales, ronchi or crackles.    GI:  No distention or pain on palpation.  Bowel sounds are present and normal in quality.  No palpable hepatomegaly or splenomegaly.  No palpable masses.  Skin:  No rashes, petechiae or purpura.  No areas of skin breakdown.  Musculoskeletal:  No grossly-evident joint effusions or deformities.  Range of motion about the shoulder, elbow, hips and knees is grossly normal. Mild CVA tenderness on right side.  Psychiatric:  Alert and oriented to person, place, time and situation.  Range of affect is appropriate.    Neurologic:    Gait is normal.  Cerebellar tasks are completed with ease and are symmetric.  Extremities:  Appear well-perfused. No clubbing, but there is 1+ bilateral edema.  No cyanosis.    Relevant Laboratory, radiology and pathology results:  I personally viewed the most recent internal records and labs and discussed the available results with the patient or family.  A summary of results follows:  Office Visit on 11/17/2017   Component Date Value Ref Range Status   ??? Color, UA 11/17/2017 Yellow   Final   ??? Clarity, UA 11/17/2017 Clear   Final   ??? Specific Gravity, UA 11/17/2017 1.009  1.003 - 1.030 Final   ??? pH, UA 11/17/2017 5.0  5.0 - 9.0 Final   ??? Leukocyte Esterase, UA 11/17/2017 Negative  Negative Final   ??? Nitrite, UA 11/17/2017 Negative  Negative Final   ??? Protein, UA 11/17/2017 Negative  Negative Final   ??? Glucose, UA 11/17/2017 Negative  Negative Final   ??? Ketones, UA 11/17/2017 Negative  Negative Final   ??? Urobilinogen, UA 11/17/2017 0.2 mg/dL  0.2 mg/dL, 1.0 mg/dL Final   ??? Bilirubin, UA 11/17/2017 Negative  Negative Final   ??? Blood, UA 11/17/2017 Small* Negative Final   ??? RBC, UA 11/17/2017 2  <=4 /HPF Final   ??? WBC, UA 11/17/2017 <1  0 - 5 /HPF Final   ??? Squam Epithel, UA 11/17/2017 <1  0 - 5 /HPF Final   ??? Bacteria, UA 11/17/2017 None Seen  None Seen /HPF Final   ??? Trans Epithel, UA 11/17/2017 <1  0 - 2 /HPF Final   ??? Hyaline Casts, UA 11/17/2017 1  0 - 1 /LPF Final   ??? Mucus, UA 11/17/2017 Rare* None Seen /HPF Final   Lab on 11/17/2017   Component Date Value Ref Range Status   ??? Sodium 11/17/2017 142  135 - 145 mmol/L Final   ??? Potassium 11/17/2017 3.9  3.5 - 5.0 mmol/L Final   ??? Chloride 11/17/2017 107  98 - 107 mmol/L Final   ??? CO2 11/17/2017 26.0  22.0 - 30.0 mmol/L  Final   ??? BUN 11/17/2017 24* 7 - 21 mg/dL Final   ??? Creatinine 11/17/2017 1.09* 0.60 - 1.00 mg/dL Final   ??? BUN/Creatinine Ratio 11/17/2017 22   Final   ??? EGFR MDRD Non Af Amer 11/17/2017 49* >=60 mL/min/1.52m2 Final   ??? EGFR MDRD Af Amer 11/17/2017 59* >=60 mL/min/1.57m2 Final   ??? Anion Gap 11/17/2017 9  9 - 15 mmol/L Final   ??? Glucose 11/17/2017 105  65 - 179 mg/dL Final   ??? Calcium 91/47/8295 9.0  8.5 - 10.2 mg/dL Final   ??? Albumin 62/13/0865 4.1  3.5 - 5.0 g/dL Final   ??? Total Protein 11/17/2017 6.4* 6.5 - 8.3 g/dL Final   ??? Total Bilirubin 11/17/2017 0.9  0.0 - 1.2 mg/dL Final   ??? AST 78/46/9629 15  14 - 38 U/L Final   ??? ALT 11/17/2017 14* 15 - 48 U/L Final   ??? Alkaline Phosphatase 11/17/2017 62  38 - 126 U/L Final   ??? WBC 11/17/2017 2.6* 4.5 - 11.0 10*9/L Final   ??? RBC 11/17/2017 3.16* 4.00 - 5.20 10*12/L Final   ??? HGB 11/17/2017 10.7* 12.0 - 16.0 g/dL Final   ??? HCT 52/84/1324 32.1* 36.0 - 46.0 % Final   ??? MCV 11/17/2017 101.8* 80.0 - 100.0 fL Final   ??? MCH 11/17/2017 33.8  26.0 - 34.0 pg Final   ??? MCHC 11/17/2017 33.2  31.0 - 37.0 g/dL Final   ??? RDW 40/06/2724 18.2* 12.0 - 15.0 % Final   ??? MPV 11/17/2017 7.9  7.0 - 10.0 fL Final   ??? Platelet 11/17/2017 12* 150 - 440 10*9/L Final   ??? Variable HGB Concentration 11/17/2017 Slight* Not Present Final   ??? Neutrophils % 11/17/2017 62.1  % Final   ??? Lymphocytes % 11/17/2017 25.7  % Final   ??? Monocytes % 11/17/2017 6.1  % Final   ??? Eosinophils % 11/17/2017 1.1  % Final   ??? Basophils % 11/17/2017 0.4  % Final   ??? Neutrophil Left Shift 11/17/2017 1+* Not Present Final   ??? Absolute Neutrophils 11/17/2017 1.6* 2.0 - 7.5 10*9/L Final   ??? Absolute Lymphocytes 11/17/2017 0.7* 1.5 - 5.0 10*9/L Final   ??? Absolute Monocytes 11/17/2017 0.2  0.2 - 0.8 10*9/L Final   ??? Absolute Eosinophils 11/17/2017 0.0  0.0 - 0.4 10*9/L Final   ??? Absolute Basophils 11/17/2017 0.0  0.0 - 0.1 10*9/L Final   ??? Large Unstained Cells 11/17/2017 5* 0 - 4 % Final   ??? Macrocytosis 11/17/2017 Marked* Not Present Final   ??? Anisocytosis 11/17/2017 Moderate* Not Present Final   ??? Hypochromasia 11/17/2017 Slight* Not Present Final   ??? Smear Review Comments 11/17/2017 See Comment* Undefined Final    Slide reviewed  Myelocytes present-rare.       Assessment:  [name] is a 77 y.o. year old female with AML progressed from previous Myelodysplastic Syndrome s/p 12 cycles of azacitidine prior to progression to AML. She initially had hematologic improvement but had eventual progression requiring more transfusions and with 38% blasts in the bone marrow. She was started on enasidenib on 06/28/17, and had clearance of peripheral blood blasts, avoidance of severe neutropenia and improvement in her hemoglobin.  She still has significant thrombocytopenia.  Overall, she will continue enasidenib, as she has had an excellent palliative benefit from this therapy.    Plan and Recommendations:  **AML, secondary after MDS  - Continue enasidenib 100 mg daily until progression or intolerance.  Given the fact that she is not on a clinical trial and is having obvious clinical benefit, we do not feel the need to document depth of remission with a bone marrow biopsy.  - Labs 2x/week locally w/ PRN transfusions.  Hopefully with a deeper response, platelet monitoring needs will be less frequent.  - RTC in four weeks  - She is likely through the window of risk from differentiation syndrome from this therapy.  Nonetheless, if she develops fever, dyspnea, hypoxia or other end organ issues, it would be reasonable to entertain the diagnosis of differentiation syndrome.    **Tumor lysis prophylaxis: She no longer needs allopurinol.    **Hematologic support: She will receive irradiated blood products when available.  If she has emergent bleeding complications, nonirradiated blood products are acceptable.  In general, she will receive red blood cell transfusion for hemoglobin less than 8 g/dL and platelets for platelet count less than 10,000 or active bleeding.    - She plans to receive platelets tomorrow locally.    **Infection prophylaxis: Given resolution of neutropenia, we only recommend that she remain on valacyclovir.    **lower back pain, recent diagnosis/treatment for UTI  - Repeat UA negative. Called patient and recommended that she follow up with her PCP for further evaluation.    Dr. Malen Gauze was available.    Mariana Kaufman, AGPCNP-BC  Nurse Practitioner  Hematology/Oncology  [facility]  11/17/2017

## 2017-11-24 ENCOUNTER — Inpatient Hospital Stay: Payer: Medicare Other

## 2017-11-24 ENCOUNTER — Inpatient Hospital Stay: Payer: Medicare Other | Admitting: *Deleted

## 2017-11-24 ENCOUNTER — Telehealth: Payer: Self-pay | Admitting: *Deleted

## 2017-11-24 DIAGNOSIS — C9200 Acute myeloblastic leukemia, not having achieved remission: Secondary | ICD-10-CM | POA: Diagnosis not present

## 2017-11-24 DIAGNOSIS — Z95828 Presence of other vascular implants and grafts: Secondary | ICD-10-CM

## 2017-11-24 LAB — BASIC METABOLIC PANEL
Anion gap: 8 (ref 5–15)
BUN: 24 mg/dL — AB (ref 6–20)
CHLORIDE: 109 mmol/L (ref 101–111)
CO2: 23 mmol/L (ref 22–32)
CREATININE: 1.07 mg/dL — AB (ref 0.44–1.00)
Calcium: 8.8 mg/dL — ABNORMAL LOW (ref 8.9–10.3)
GFR calc Af Amer: 57 mL/min — ABNORMAL LOW (ref >60)
GFR calc non Af Amer: 49 mL/min — ABNORMAL LOW (ref >60)
Glucose, Bld: 127 mg/dL — ABNORMAL HIGH (ref 65–99)
Potassium: 3.8 mmol/L (ref 3.5–5.1)
SODIUM: 140 mmol/L (ref 135–145)

## 2017-11-24 LAB — CBC WITH DIFFERENTIAL/PLATELET
BAND NEUTROPHILS: 6 %
BASOS ABS: 0.1 10*3/uL (ref 0–0.1)
Basophils Relative: 2 %
Blasts: 0 %
EOS ABS: 0 10*3/uL (ref 0–0.7)
EOS PCT: 0 %
HCT: 28.5 % — ABNORMAL LOW (ref 35.0–47.0)
HEMOGLOBIN: 9.8 g/dL — AB (ref 12.0–16.0)
LYMPHS ABS: 1 10*3/uL (ref 1.0–3.6)
Lymphocytes Relative: 38 %
MCH: 34.6 pg — ABNORMAL HIGH (ref 26.0–34.0)
MCHC: 34.5 g/dL (ref 32.0–36.0)
MCV: 100.1 fL — ABNORMAL HIGH (ref 80.0–100.0)
METAMYELOCYTES PCT: 2 %
MONO ABS: 0.1 10*3/uL — AB (ref 0.2–0.9)
MYELOCYTES: 3 %
Monocytes Relative: 4 %
Neutro Abs: 1.3 10*3/uL — ABNORMAL LOW (ref 1.4–6.5)
Neutrophils Relative %: 45 %
Other: 0 %
PLATELETS: 17 10*3/uL — AB (ref 150–400)
Promyelocytes Absolute: 0 %
RBC: 2.84 MIL/uL — ABNORMAL LOW (ref 3.80–5.20)
RDW: 16 % — AB (ref 11.5–14.5)
Smear Review: DECREASED
WBC: 2.5 10*3/uL — AB (ref 3.6–11.0)
nRBC: 0 /100 WBC

## 2017-11-24 LAB — SAMPLE TO BLOOD BANK

## 2017-11-24 MED ORDER — HEPARIN SOD (PORK) LOCK FLUSH 100 UNIT/ML IV SOLN
500.0000 [IU] | Freq: Once | INTRAVENOUS | Status: AC
  Administered 2017-11-24: 500 [IU] via INTRAVENOUS

## 2017-11-24 MED ORDER — HEPARIN SOD (PORK) LOCK FLUSH 100 UNIT/ML IV SOLN: 500.0000 [IU] | INTRAVENOUS | Status: DC | PRN

## 2017-11-24 MED ORDER — SODIUM CHLORIDE 0.9% FLUSH
10.0000 mL | Freq: Once | INTRAVENOUS | Status: AC
  Administered 2017-11-24: 10 mL via INTRAVENOUS
  Filled 2017-11-24: qty 10

## 2017-11-24 MED ORDER — SODIUM CHLORIDE 0.9% FLUSH
10.0000 mL | INTRAVENOUS | Status: AC | PRN
  Administered 2017-11-24: 10 mL
  Filled 2017-11-24: qty 10

## 2017-11-24 NOTE — Telephone Encounter (Signed)
0952 - Incoming call from [facility] Lab [facility]). Critical lab plts- 16; hgb 9.8.  Read back process performed with Maudie Mercury.   Dr. Rogue Bussing informed at 57- read back process performed with lab tech

## 2017-11-25 ENCOUNTER — Other Ambulatory Visit: Payer: Medicare Other

## 2017-11-25 ENCOUNTER — Ambulatory Visit: Payer: Medicare Other

## 2017-11-25 MED FILL — IDHIFA/100 MG/TAB: IDHIFA/100 MG/TAB | 30 days supply | Qty: 30 | Fill #5

## 2017-11-27 ENCOUNTER — Inpatient Hospital Stay: Payer: Medicare Other

## 2017-11-27 ENCOUNTER — Telehealth: Payer: Self-pay | Admitting: *Deleted

## 2017-11-27 DIAGNOSIS — Z95828 Presence of other vascular implants and grafts: Secondary | ICD-10-CM

## 2017-11-27 DIAGNOSIS — C9202 Acute myeloblastic leukemia, in relapse: Secondary | ICD-10-CM

## 2017-11-27 DIAGNOSIS — C9200 Acute myeloblastic leukemia, not having achieved remission: Secondary | ICD-10-CM | POA: Diagnosis not present

## 2017-11-27 LAB — CBC WITH DIFFERENTIAL/PLATELET
BLASTS: 1 %
Band Neutrophils: 5 %
Basophils Absolute: 0 10*3/uL (ref 0–0.1)
Basophils Relative: 1 %
EOS PCT: 1 %
Eosinophils Absolute: 0 10*3/uL (ref 0–0.7)
HCT: 29.2 % — ABNORMAL LOW (ref 35.0–47.0)
Hemoglobin: 10 g/dL — ABNORMAL LOW (ref 12.0–16.0)
LYMPHS ABS: 0.9 10*3/uL — AB (ref 1.0–3.6)
Lymphocytes Relative: 35 %
MCH: 34.1 pg — AB (ref 26.0–34.0)
MCHC: 34.4 g/dL (ref 32.0–36.0)
MCV: 99.2 fL (ref 80.0–100.0)
METAMYELOCYTES PCT: 1 %
MONO ABS: 0.2 10*3/uL (ref 0.2–0.9)
MONOS PCT: 9 %
MYELOCYTES: 1 %
NEUTROS PCT: 45 %
Neutro Abs: 1.3 10*3/uL — ABNORMAL LOW (ref 1.4–6.5)
PLATELETS: 18 10*3/uL — AB (ref 150–400)
Promyelocytes Absolute: 1 %
RBC: 2.94 MIL/uL — AB (ref 3.80–5.20)
RDW: 16 % — ABNORMAL HIGH (ref 11.5–14.5)
WBC: 2.5 10*3/uL — AB (ref 3.6–11.0)
nRBC: 2 /100 WBC — ABNORMAL HIGH

## 2017-11-27 LAB — SAMPLE TO BLOOD BANK

## 2017-11-27 LAB — BASIC METABOLIC PANEL
Anion gap: 8 (ref 5–15)
BUN: 23 mg/dL — ABNORMAL HIGH (ref 6–20)
CHLORIDE: 108 mmol/L (ref 101–111)
CO2: 23 mmol/L (ref 22–32)
Calcium: 8.6 mg/dL — ABNORMAL LOW (ref 8.9–10.3)
Creatinine, Ser: 1.03 mg/dL — ABNORMAL HIGH (ref 0.44–1.00)
GFR calc non Af Amer: 51 mL/min — ABNORMAL LOW (ref >60)
GFR, EST AFRICAN AMERICAN: 60 mL/min — AB (ref >60)
Glucose, Bld: 121 mg/dL — ABNORMAL HIGH (ref 65–99)
POTASSIUM: 3.7 mmol/L (ref 3.5–5.1)
SODIUM: 139 mmol/L (ref 135–145)

## 2017-11-27 MED ORDER — SODIUM CHLORIDE 0.9% FLUSH
10.0000 mL | INTRAVENOUS | Status: AC | PRN
  Administered 2017-11-27: 10 mL
  Filled 2017-11-27: qty 10

## 2017-11-27 MED ORDER — HEPARIN SOD (PORK) LOCK FLUSH 100 UNIT/ML IV SOLN
500.0000 [IU] | INTRAVENOUS | Status: AC | PRN
  Administered 2017-11-27: 500 [IU]

## 2017-11-27 NOTE — Telephone Encounter (Signed)
950-am Call report- Lonnie in [facility] center lab. Critical value. plts counts 16. hgb is 10.  10 15 am I spoke with Lauren, NP.-read back process performed with NP -  Will hold off on plt infusion today. Spoke with pt. She gave verbal understanding. Will keep apts on Monday as scheduled.

## 2017-12-01 ENCOUNTER — Inpatient Hospital Stay: Payer: Medicare Other

## 2017-12-01 DIAGNOSIS — C9200 Acute myeloblastic leukemia, not having achieved remission: Secondary | ICD-10-CM

## 2017-12-01 DIAGNOSIS — Z95828 Presence of other vascular implants and grafts: Secondary | ICD-10-CM

## 2017-12-01 LAB — BASIC METABOLIC PANEL
Anion gap: 8 (ref 5–15)
BUN: 22 mg/dL — AB (ref 6–20)
CO2: 25 mmol/L (ref 22–32)
CREATININE: 1.11 mg/dL — AB (ref 0.44–1.00)
Calcium: 8.8 mg/dL — ABNORMAL LOW (ref 8.9–10.3)
Chloride: 104 mmol/L (ref 101–111)
GFR calc Af Amer: 54 mL/min — ABNORMAL LOW (ref >60)
GFR, EST NON AFRICAN AMERICAN: 47 mL/min — AB (ref >60)
GLUCOSE: 120 mg/dL — AB (ref 65–99)
Potassium: 3.9 mmol/L (ref 3.5–5.1)
SODIUM: 137 mmol/L (ref 135–145)

## 2017-12-01 LAB — CBC WITH DIFFERENTIAL/PLATELET
BASOS ABS: 0.1 10*3/uL (ref 0–0.1)
Basophils Relative: 4 %
EOS ABS: 0 10*3/uL (ref 0–0.7)
Eosinophils Relative: 0 %
HCT: 29.2 % — ABNORMAL LOW (ref 35.0–47.0)
Hemoglobin: 10.1 g/dL — ABNORMAL LOW (ref 12.0–16.0)
LYMPHS ABS: 0.9 10*3/uL — AB (ref 1.0–3.6)
Lymphocytes Relative: 31 %
MCH: 34.3 pg — ABNORMAL HIGH (ref 26.0–34.0)
MCHC: 34.7 g/dL (ref 32.0–36.0)
MCV: 99 fL (ref 80.0–100.0)
Monocytes Absolute: 0.3 10*3/uL (ref 0.2–0.9)
Monocytes Relative: 9 %
NEUTROS ABS: 1.5 10*3/uL (ref 1.4–6.5)
Neutrophils Relative %: 56 %
PLATELETS: 26 10*3/uL — AB (ref 150–400)
RBC: 2.95 MIL/uL — AB (ref 3.80–5.20)
RDW: 16.2 % — AB (ref 11.5–14.5)
WBC: 2.8 10*3/uL — AB (ref 4.0–10.5)

## 2017-12-01 LAB — SAMPLE TO BLOOD BANK

## 2017-12-01 MED ORDER — HEPARIN SOD (PORK) LOCK FLUSH 100 UNIT/ML IV SOLN
500.0000 [IU] | INTRAVENOUS | Status: AC | PRN
  Administered 2017-12-01: 500 [IU]

## 2017-12-01 MED ORDER — SODIUM CHLORIDE 0.9% FLUSH
10.0000 mL | INTRAVENOUS | Status: AC | PRN
  Administered 2017-12-01: 10 mL
  Filled 2017-12-01: qty 10

## 2017-12-04 ENCOUNTER — Other Ambulatory Visit: Payer: Self-pay

## 2017-12-04 ENCOUNTER — Encounter: Payer: Self-pay | Admitting: Internal Medicine

## 2017-12-04 ENCOUNTER — Inpatient Hospital Stay: Payer: Medicare Other

## 2017-12-04 ENCOUNTER — Inpatient Hospital Stay (HOSPITAL_BASED_OUTPATIENT_CLINIC_OR_DEPARTMENT_OTHER): Payer: Medicare Other | Admitting: Internal Medicine

## 2017-12-04 VITALS — BP 143/81 | HR 69 | Temp 97.9°F | Resp 20 | Ht 63.0 in | Wt 225.0 lb

## 2017-12-04 DIAGNOSIS — Z87891 Personal history of nicotine dependence: Secondary | ICD-10-CM

## 2017-12-04 DIAGNOSIS — R5383 Other fatigue: Secondary | ICD-10-CM

## 2017-12-04 DIAGNOSIS — N183 Chronic kidney disease, stage 3 (moderate): Secondary | ICD-10-CM

## 2017-12-04 DIAGNOSIS — C9200 Acute myeloblastic leukemia, not having achieved remission: Secondary | ICD-10-CM

## 2017-12-04 LAB — CBC WITH DIFFERENTIAL/PLATELET
BAND NEUTROPHILS: 4 %
BASOS PCT: 1 %
Basophils Absolute: 0 10*3/uL (ref 0–0.1)
EOS ABS: 0 10*3/uL (ref 0–0.7)
Eosinophils Relative: 0 %
HCT: 29.8 % — ABNORMAL LOW (ref 35.0–47.0)
HEMOGLOBIN: 10.1 g/dL — AB (ref 12.0–16.0)
Lymphocytes Relative: 43 %
Lymphs Abs: 1.2 10*3/uL (ref 1.0–3.6)
MCH: 34.1 pg — AB (ref 26.0–34.0)
MCHC: 34.1 g/dL (ref 32.0–36.0)
MCV: 100 fL (ref 80.0–100.0)
METAMYELOCYTES PCT: 1 %
Monocytes Absolute: 0.2 10*3/uL (ref 0.2–0.9)
Monocytes Relative: 6 %
Myelocytes: 2 %
NEUTROS ABS: 1.3 10*3/uL — AB (ref 1.4–6.5)
Neutrophils Relative %: 43 %
Platelets: 21 10*3/uL — CL (ref 150–400)
RBC: 2.98 MIL/uL — ABNORMAL LOW (ref 3.80–5.20)
RDW: 16.1 % — ABNORMAL HIGH (ref 11.5–14.5)
SMEAR REVIEW: DECREASED
WBC: 2.7 10*3/uL — ABNORMAL LOW (ref 3.6–11.0)
nRBC: 1 /100 WBC — ABNORMAL HIGH

## 2017-12-04 LAB — BASIC METABOLIC PANEL
Anion gap: 9 (ref 5–15)
BUN: 20 mg/dL (ref 6–20)
CALCIUM: 8.8 mg/dL — AB (ref 8.9–10.3)
CO2: 25 mmol/L (ref 22–32)
Chloride: 106 mmol/L (ref 101–111)
Creatinine, Ser: 1.13 mg/dL — ABNORMAL HIGH (ref 0.44–1.00)
GFR calc Af Amer: 53 mL/min — ABNORMAL LOW (ref >60)
GFR, EST NON AFRICAN AMERICAN: 46 mL/min — AB (ref >60)
GLUCOSE: 112 mg/dL — AB (ref 65–99)
POTASSIUM: 4.7 mmol/L (ref 3.5–5.1)
Sodium: 140 mmol/L (ref 135–145)

## 2017-12-04 LAB — SAMPLE TO BLOOD BANK

## 2017-12-04 NOTE — Progress Notes (Signed)
[place] NOTE  Patient Care Team: Lada, Satira Anis, [name] Cammie Sickle, MD as Consulting Physician (Internal Medicine) Gloriann Loan, MD as Attending Physician Erby Pian, MD as Referring Physician (Specialist) Yolonda Kida, MD as Consulting Physician (Cardiology) Albertine Patricia, [place] as Attending Physician (Podiatry) Lavonia Dana, MD as Consulting Physician (Nephrology)  CHIEF COMPLAINTS/PURPOSE OF CONSULTATION:   Oncology History   #JUNE 2017- Severe neutropenia/ Anemia ~hb 9/platlets- 85-100 AUG 2017- REFRACTORY ANEMIA with EXCESS BLASTS [14% blasts- BMBx]; cytogenetics/FISH-N [SNP micorarray- not done]   # AUG 21st 2017-  START AZA 4m/m2 day- 1-7 q 28 days x4 cycles; DEC 6th- BMBx- <5% blasts; hypercellular with dysplasia.   # SEP 20th 2018- ACUTE MYELOID LEUKEMIA [38% blasts on BMBx]; IDH-MUTATION PRESENT; low FLT-3; OCT 18th 2018- ENASIDENIB [Dr.Foster; [facility]]   # CKD stage III  ------------------------------------------------------   MOLECULAR TESTING: NGS sent 06/2017 -FLT3-ITD <0.05 mutational burden -IDH2 -DNMT3A --treatment options include FLT3 inhibition and IDH2 inhibition; given low level FTDV7-OHYallelic ratio [admittedly in peripheral blood, not bone marrow]- started on ENASIDENIB     MDS (myelodysplastic syndrome), high grade ([facility]   04/23/2016 Initial Diagnosis    MDS (myelodysplastic syndrome), high grade (HCC)       Acute myeloid leukemia not having achieved remission ([place] [place]    HISTORY OF PRESENTING ILLNESS:  Teresa 89.  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  Patient complains of mild fatigue.  This is not getting any worse.  She denies any nausea vomiting diarrhea.  Denies any fevers.  Denies any gum bleeding or nosebleeds.  Her last blood transfusion was  approximately 3 months ago; platelet transfusion was approximately 1 month ago.  ROS: A complete 10 point review of system is done which is negative except mentioned above in history of present illness.  MEDICAL HISTORY:  Past Medical History:  Diagnosis Date  . Abdominal wall mass   . Anginal pain (H[place]   . Anxiety   . Arthritis   . Calculus of kidney   . Cystitis   . Depression   . Diabetes mellitus without complication (HCC)    elevated A1c  . Dyspnea on exertion   . Elevated serum creatinine   . Fibrocystic breast disease   . GERD (gastroesophageal reflux disease)   . Hearing loss   . Heart murmur   . HTN (hypertension)   . Hyperlipidemia   . MDS (myelodysplastic syndrome), high grade ([place] 04/23/2016  . Microscopic hematuria   . Mouth sores   . Mucositis due to chemotherapy   . Obesity   . Obesity, morbid ([facility] [place] 09/24/2017   BMI > 35 with type 2 diabetes  . Pneumonia   . Risk for falls   . Sleep apnea   . Thrombocytopenia ([place]   . Urinary frequency   . Urinary urgency     SURGICAL HISTORY: Past Surgical History:  Procedure Laterality Date  . ABDOMINAL HYSTERECTOMY    . APPENDECTOMY    . CARDIAC CATHETERIZATION     x2  . CHOLECYSTECTOMY    . COLONOSCOPY N/A 02/24/2015   Procedure: COLONOSCOPY;  Surgeon: RManya Silvas MD;  [facility]  Service: Endoscopy;  Laterality: N/A;  . DIAGNOSTIC LAPAROSCOPY     Removal of benign abdominal tumor  . ESOPHAGOGASTRODUODENOSCOPY N/A 02/24/2015   Procedure: ESOPHAGOGASTRODUODENOSCOPY (EGD);  Surgeon: RGavin Pound  Vira Agar, MD;  Location: [facility] ENDOSCOPY;  Service: Endoscopy;  Laterality: N/A;  . IR FLUORO GUIDE PORT INSERTION LEFT  10/09/2017  . right eye surgery Right     SOCIAL HISTORY: lives with family; snow[employer]; [employer] in [place] retd. No smoking/ no alcohol.  Social History   Socioeconomic History  . Marital status: Married    Spouse name: Not on file  . Number of children: Not on file  . Years of  education: Not on file  . Highest education level: Not on file  Occupational History  . Not on file  Social Needs  . Financial resource strain: Not on file  . Food insecurity:    Worry: Not on file    Inability: Not on file  . Transportation needs:    Medical: Not on file    Non-medical: Not on file  Tobacco Use  . Smoking status: Former Smoker    Types: Cigarettes    Last attempt to quit: 09/09/1988    Years since quitting: 29.2  . Smokeless tobacco: Never Used  . Tobacco comment: quit 25 years ago  Substance and Sexual Activity  . Alcohol use: No    Alcohol/week: 0.0 oz  . Drug use: No  . Sexual activity: Not Currently    Birth control/protection: Post-menopausal  Lifestyle  . Physical activity:    Days per week: Not on file    Minutes per session: Not on file  . Stress: Not on file  Relationships  . Social connections:    Talks on phone: Not on file    Gets together: Not on file    Attends religious service: Not on file    Active member of club or organization: Not on file    Attends meetings of clubs or organizations: Not on file    Relationship status: Not on file  . Intimate partner violence:    Fear of current or ex partner: Not on file    Emotionally abused: Not on file    Physically abused: Not on file    Forced sexual activity: Not on file  Other Topics Concern  . Not on file  Social History Narrative  . Not on file    FAMILY HISTORY: no cancers in family.  Family History  Problem Relation Age of Onset  . Congestive Heart Failure Mother   . Diabetes Mother   . Coronary artery disease Mother   . Stroke Mother   . Cirrhosis Father   . Diabetes Brother     ALLERGIES:  is allergic to macrobid [nitrofurantoin monohyd macro].  MEDICATIONS:  Current Outpatient Medications  Medication Sig Dispense Refill  . acyclovir (ZOVIRAX) 400 MG tablet Take 400 mg by mouth 2 (two) times daily.    Marland Kitchen buPROPion (WELLBUTRIN XL) 150 MG 24 hr tablet Take 300 mg by  mouth daily at 12 noon.     . clonazePAM (KLONOPIN) 0.5 MG tablet Take 0.5 mg by mouth 2 (two) times daily.     Marland Kitchen diltiazem (CARDIZEM CD) 180 MG 24 hr capsule Take 1 capsule (180 mg total) by mouth daily. 30 capsule 0  . IDHIFA 100 MG TABS Take 100 mg by mouth daily.    Marland Kitchen lidocaine-prilocaine (EMLA) cream Apply 1 application topically as needed. Apply small amount to port site at least 1 hour prior to it being accessed, cover with plastic wrap 30 g 1  . sertraline (ZOLOFT) 100 MG tablet Take 100 mg by mouth daily at 12 noon.     Marland Kitchen  tranexamic acid (LYSTEDA) 650 MG TABS tablet Take 2 tablets by mouth as needed for bleeding. Taking for bleeding gums    . albuterol (PROVENTIL HFA;VENTOLIN HFA) 108 (90 Base) MCG/ACT inhaler Inhale 2 puffs into the lungs every 6 (six) hours as needed for wheezing or shortness of breath. (Patient not taking: Reported on 12/04/2017) 1 Inhaler 2  . chlorhexidine (PERIDEX) 0.12 % solution 15 mLs by Mouth Rinse route 2 (two) times daily.  4  . ondansetron (ZOFRAN-ODT) 4 MG disintegrating tablet Take 4 mg by mouth every 8 (eight) hours as needed for nausea or vomiting.    . prochlorperazine (COMPAZINE) 10 MG tablet Take 1 tablet (10 mg total) by mouth every 6 (six) hours as needed (Nausea or vomiting). (Patient not taking: Reported on 12/04/2017) 30 tablet 1  . umeclidinium-vilanterol (ANORO ELLIPTA) 62.5-25 MCG/INH AEPB Inhale 1 puff into the lungs as needed.     No current facility-administered medications for this visit.       Marland Kitchen  PHYSICAL EXAMINATION: ECOG PERFORMANCE STATUS: 1 - Symptomatic but completely ambulatory  Vitals:   12/04/17 0915  BP: (!) 143/81  Pulse: 69  Resp: 20  Temp: 97.9 F (36.6 C)   Filed Weights   12/04/17 0926  Weight: 225 lb (102.1 kg)    GENERAL: Well-nourished well-developed; Alert, no distress and comfortable.   Obese. Accompanied by her husband. She is walking by herself. EYES: Positive for pallor. OROPHARYNX: no thrush or  ulceration;  NECK: supple, no masses felt LYMPH:  no palpable lymphadenopathy in the cervical, axillary or inguinal regions LUNGS: Decreased breath sounds to auscultation and  No wheeze or crackles HEART/CVS: regular rate & rhythm and no murmurs; No lower extremity edema ABDOMEN: abdomen soft, non-tender and normal bowel sounds; possible splenomegaly. Musculoskeletal:no cyanosis of digits and no clubbing  PSYCH: alert & oriented x 3 with fluent speech NEURO: no focal motor/sensory deficits SKIN:  No skin rash/nodules.  LABORATORY DATA:  I have reviewed the data as listed Lab Results  Component Value Date   WBC 2.7 (L) 12/04/2017   HGB 10.1 (L) 12/04/2017   HCT 29.8 (L) 12/04/2017   MCV 100.0 12/04/2017   PLT 21 (LL) 12/04/2017   Recent Labs    07/17/17 1229  08/11/17 0948  08/25/17 0938  10/01/17 0811  11/27/17 0937 12/01/17 0940 12/04/17 0859  NA 137   < > 141   < > 140   < > 139   < > 139 137 140  K 3.6   < > 4.0   < > 4.3   < > 4.1   < > 3.7 3.9 4.7  CL 103   < > 104   < > 105   < > 106   < > 108 104 106  CO2 24   < > 24   < > 26   < > 27   < > _0 GLUCOSE 114*   < > 132*   < > 136*   < > 120*   < > 121* 120* 112*  BUN 16   < > 16   < > 17   < > 24*   < > 23* 22* 20  CREATININE 1.37*   < > 1.23*   < > 1.28*   < > 1.24*   < > 1.03* 1.11* 1.13*  CALCIUM 9.2   < > 9.1   < > 9.0   < > 8.8*   < > 8.6*  8.8* 8.8*  GFRNONAA 37*   < > 42*   < > 40*   < > 41*   < > 51* 47* 46*  GFRAA 43*   < > 48*   < > 46*   < > 48*   < > 60* 54* 53*  PROT 8.0   < > 7.4  --  6.9  --  6.7  --   --   --   --   ALBUMIN 3.6   < > 3.8  --  3.8  --  3.9  --   --   --   --   AST 29   < > 20  --  24  --  16  --   --   --   --   ALT 19   < > 15  --  27  --  13*  --   --   --   --   ALKPHOS 77   < > 68  --  62  --  63  --   --   --   --   BILITOT 1.2   < > 1.1  --  1.1  --  1.1  --   --   --   --   BILIDIR 0.1  --   --   --   --   --   --   --   --   --   --   IBILI 1.1*  --   --   --   --   --    --   --   --   --   --    < > = values in this interval not displayed.    RADIOGRAPHIC STUDIES: I have personally reviewed the radiological images as listed and agreed with the findings in the report. Dg Thoracic Spine 2 View  Result Date: 11/14/2017 CLINICAL DATA:  Acute upper back pain without known injury. EXAM: THORACIC SPINE 2 VIEWS COMPARISON:  CT scan of August 25, 2017. FINDINGS: No fracture or spondylolisthesis is noted. Anterior osteophyte formation is noted throughout the mid and lower thoracic spine. Disc spaces are well-maintained. IMPRESSION: Degenerative changes as described above. No acute abnormality seen in the thoracic spine. Electronically Signed   By: Marijo Conception, M.D.   On: 11/14/2017 11:55   IMPRESSION: 1. Mid and lower lung zone peribronchovascular ground-glass, nodularity and consolidation, likely due to an infectious bronchiolitis/bronchopneumonia. 2. Upper abdominal adenopathy, as on 07/17/2017. 3. Aortic atherosclerosis (ICD10-170.0). Moderate coronary artery calcification.   Electronically Signed   By: Lorin Picket M.D.   On: 08/25/2017 11:57 ASSESSMENT & PLAN:   Acute myeloid leukemia not having achieved remission ([place]) AML - progressed from MDS- 12 cycles azacitadine; progressed to AML w/ 38% blasts- 05/2017. Repeat B[facility]x 11/12 at [facility] shows increased in blasts 35% to >60% per report. Currently on enasidenib [started 06/28/17].   # partial response- mostly improvement/stability of white count/hb; still needing ~ 1 platelets ~transfusion appx 1 month ago!!  Patient's last blood transfusion was approximately 2 months ago.  Given the clinical benefit-continue Enasidenib.   # low back pain/ radiating bil- resolved.   # AKI/CKD - stage III.stable.   Follow up in 3 weeks;      Cammie Sickle, MD 12/04/2017 2:34 PM

## 2017-12-04 NOTE — Progress Notes (Signed)
No platelets today per Dr. Rogue Bussing.

## 2017-12-04 NOTE — Progress Notes (Signed)
Received phone call from [facility] in [facility] Lab - 9:33AM - read back process completed with lab - plt count 21. Dr. Rogue Bussing informed at 935 am- read back process performed with md.

## 2017-12-04 NOTE — Assessment & Plan Note (Addendum)
AML - progressed from MDS- 12 cycles azacitadine; progressed to AML w/ 38% blasts- 05/2017. Repeat BMBx 11/12 at [facility] shows increased in blasts 35% to >60% per report. Currently on enasidenib [started 06/28/17].   # partial response- mostly improvement/stability of white count/hb; still needing ~ 1 platelets ~transfusion appx 1 month ago!!  Patient's last blood transfusion was approximately 2 months ago.  Given the clinical benefit-continue Enasidenib.   # low back pain/ radiating bil- resolved.   # AKI/CKD - stage III.stable.   Follow up in 3 weeks;

## 2017-12-08 ENCOUNTER — Inpatient Hospital Stay: Payer: Medicare Other | Attending: Internal Medicine

## 2017-12-08 DIAGNOSIS — N183 Chronic kidney disease, stage 3 (moderate): Secondary | ICD-10-CM | POA: Insufficient documentation

## 2017-12-08 DIAGNOSIS — M545 Low back pain: Secondary | ICD-10-CM | POA: Diagnosis not present

## 2017-12-08 DIAGNOSIS — I700 Atherosclerosis of aorta: Secondary | ICD-10-CM | POA: Diagnosis not present

## 2017-12-08 DIAGNOSIS — C9200 Acute myeloblastic leukemia, not having achieved remission: Secondary | ICD-10-CM | POA: Insufficient documentation

## 2017-12-08 DIAGNOSIS — D46Z Other myelodysplastic syndromes: Secondary | ICD-10-CM

## 2017-12-08 LAB — CBC WITH DIFFERENTIAL/PLATELET
Basophils Absolute: 0 10*3/uL (ref 0–0.1)
Basophils Relative: 1 %
EOS ABS: 0 10*3/uL (ref 0–0.7)
Eosinophils Relative: 0 %
HCT: 30.5 % — ABNORMAL LOW (ref 35.0–47.0)
HEMOGLOBIN: 10.4 g/dL — AB (ref 12.0–16.0)
LYMPHS ABS: 0.6 10*3/uL — AB (ref 1.0–3.6)
LYMPHS PCT: 24 %
MCH: 34 pg (ref 26.0–34.0)
MCHC: 34 g/dL (ref 32.0–36.0)
MCV: 99.9 fL (ref 80.0–100.0)
METAMYELOCYTES PCT: 2 %
Monocytes Absolute: 0.1 10*3/uL — ABNORMAL LOW (ref 0.2–0.9)
Monocytes Relative: 5 %
Myelocytes: 3 %
NEUTROS ABS: 1.9 10*3/uL (ref 1.4–6.5)
Neutrophils Relative %: 65 %
Platelets: 32 10*3/uL — ABNORMAL LOW (ref 150–400)
RBC: 3.05 MIL/uL — ABNORMAL LOW (ref 3.80–5.20)
RDW: 16.9 % — ABNORMAL HIGH (ref 11.5–14.5)
Smear Review: DECREASED
WBC: 2.6 10*3/uL — AB (ref 3.6–11.0)
nRBC: 2 /100 WBC — ABNORMAL HIGH

## 2017-12-08 LAB — BASIC METABOLIC PANEL
ANION GAP: 9 (ref 5–15)
BUN: 27 mg/dL — ABNORMAL HIGH (ref 6–20)
CALCIUM: 9 mg/dL (ref 8.9–10.3)
CO2: 23 mmol/L (ref 22–32)
CREATININE: 1.15 mg/dL — AB (ref 0.44–1.00)
Chloride: 107 mmol/L (ref 101–111)
GFR calc non Af Amer: 45 mL/min — ABNORMAL LOW (ref >60)
GFR, EST AFRICAN AMERICAN: 52 mL/min — AB (ref >60)
Glucose, Bld: 121 mg/dL — ABNORMAL HIGH (ref 65–99)
Potassium: 4.6 mmol/L (ref 3.5–5.1)
SODIUM: 139 mmol/L (ref 135–145)

## 2017-12-08 LAB — SAMPLE TO BLOOD BANK

## 2017-12-11 ENCOUNTER — Ambulatory Visit: Payer: Medicare Other

## 2017-12-11 ENCOUNTER — Inpatient Hospital Stay: Payer: Medicare Other

## 2017-12-11 DIAGNOSIS — C9200 Acute myeloblastic leukemia, not having achieved remission: Secondary | ICD-10-CM

## 2017-12-11 DIAGNOSIS — M545 Low back pain: Secondary | ICD-10-CM | POA: Diagnosis not present

## 2017-12-11 DIAGNOSIS — N183 Chronic kidney disease, stage 3 (moderate): Secondary | ICD-10-CM | POA: Diagnosis not present

## 2017-12-11 DIAGNOSIS — I700 Atherosclerosis of aorta: Secondary | ICD-10-CM | POA: Diagnosis not present

## 2017-12-11 LAB — CBC WITH DIFFERENTIAL/PLATELET
Basophils Absolute: 0.1 10*3/uL (ref 0–0.1)
Basophils Relative: 4 %
EOS ABS: 0 10*3/uL (ref 0–0.7)
EOS PCT: 0 %
HEMATOCRIT: 30.8 % — AB (ref 35.0–47.0)
Hemoglobin: 10.5 g/dL — ABNORMAL LOW (ref 12.0–16.0)
LYMPHS ABS: 0.8 10*3/uL — AB (ref 1.0–3.6)
LYMPHS PCT: 31 %
MCH: 33.9 pg (ref 26.0–34.0)
MCHC: 34.1 g/dL (ref 32.0–36.0)
MCV: 99.4 fL (ref 80.0–100.0)
MONO ABS: 0.2 10*3/uL (ref 0.2–0.9)
Monocytes Relative: 8 %
Neutro Abs: 1.5 10*3/uL (ref 1.4–6.5)
Neutrophils Relative %: 57 %
Platelets: 33 10*3/uL — ABNORMAL LOW (ref 150–400)
RBC: 3.1 MIL/uL — ABNORMAL LOW (ref 3.80–5.20)
RDW: 16.4 % — AB (ref 11.5–14.5)
Smear Review: DECREASED
WBC: 2.6 10*3/uL — ABNORMAL LOW (ref 3.6–11.0)

## 2017-12-11 LAB — BASIC METABOLIC PANEL
Anion gap: 8 (ref 5–15)
BUN: 25 mg/dL — AB (ref 6–20)
CALCIUM: 8.7 mg/dL — AB (ref 8.9–10.3)
CO2: 23 mmol/L (ref 22–32)
CREATININE: 1.1 mg/dL — AB (ref 0.44–1.00)
Chloride: 107 mmol/L (ref 101–111)
GFR calc Af Amer: 55 mL/min — ABNORMAL LOW (ref >60)
GFR calc non Af Amer: 48 mL/min — ABNORMAL LOW (ref >60)
GLUCOSE: 107 mg/dL — AB (ref 65–99)
Potassium: 4.7 mmol/L (ref 3.5–5.1)
Sodium: 138 mmol/L (ref 135–145)

## 2017-12-11 LAB — SAMPLE TO BLOOD BANK

## 2017-12-15 ENCOUNTER — Encounter: Admit: 2017-12-15 | Discharge: 2017-12-16 | Payer: MEDICARE

## 2017-12-15 ENCOUNTER — Ambulatory Visit: Admit: 2017-12-15 | Discharge: 2017-12-16 | Payer: MEDICARE

## 2017-12-15 ENCOUNTER — Other Ambulatory Visit: Admit: 2017-12-15 | Discharge: 2017-12-16 | Payer: MEDICARE

## 2017-12-15 ENCOUNTER — Encounter: Admit: 2017-12-15 | Discharge: 2017-12-16 | Payer: MEDICARE | Attending: Oncology | Primary: Oncology

## 2017-12-15 DIAGNOSIS — C9200 Acute myeloblastic leukemia, not having achieved remission: Principal | ICD-10-CM

## 2017-12-15 DIAGNOSIS — D469 Myelodysplastic syndrome, unspecified: Principal | ICD-10-CM

## 2017-12-15 DIAGNOSIS — R3129 Other microscopic hematuria: Principal | ICD-10-CM

## 2017-12-15 DIAGNOSIS — R310 Gross hematuria: Secondary | ICD-10-CM

## 2017-12-15 DIAGNOSIS — R1032 Left lower quadrant pain: Secondary | ICD-10-CM

## 2017-12-15 DIAGNOSIS — D46Z Other myelodysplastic syndromes: Principal | ICD-10-CM

## 2017-12-15 DIAGNOSIS — R109 Unspecified abdominal pain: Secondary | ICD-10-CM

## 2017-12-15 DIAGNOSIS — Z9221 Personal history of antineoplastic chemotherapy: Secondary | ICD-10-CM | POA: Diagnosis not present

## 2017-12-15 DIAGNOSIS — R112 Nausea with vomiting, unspecified: Secondary | ICD-10-CM | POA: Diagnosis not present

## 2017-12-15 DIAGNOSIS — I2510 Atherosclerotic heart disease of native coronary artery without angina pectoris: Secondary | ICD-10-CM | POA: Diagnosis not present

## 2017-12-15 DIAGNOSIS — R944 Abnormal results of kidney function studies: Secondary | ICD-10-CM | POA: Diagnosis not present

## 2017-12-15 DIAGNOSIS — N189 Chronic kidney disease, unspecified: Secondary | ICD-10-CM | POA: Diagnosis not present

## 2017-12-15 DIAGNOSIS — R319 Hematuria, unspecified: Secondary | ICD-10-CM | POA: Diagnosis not present

## 2017-12-15 DIAGNOSIS — I480 Paroxysmal atrial fibrillation: Secondary | ICD-10-CM | POA: Diagnosis not present

## 2017-12-15 DIAGNOSIS — Z9289 Personal history of other medical treatment: Secondary | ICD-10-CM | POA: Diagnosis not present

## 2017-12-15 DIAGNOSIS — N132 Hydronephrosis with renal and ureteral calculous obstruction: Secondary | ICD-10-CM | POA: Diagnosis not present

## 2017-12-15 DIAGNOSIS — E1122 Type 2 diabetes mellitus with diabetic chronic kidney disease: Secondary | ICD-10-CM | POA: Diagnosis not present

## 2017-12-15 DIAGNOSIS — Z79899 Other long term (current) drug therapy: Secondary | ICD-10-CM | POA: Diagnosis not present

## 2017-12-15 DIAGNOSIS — N200 Calculus of kidney: Secondary | ICD-10-CM | POA: Diagnosis not present

## 2017-12-15 DIAGNOSIS — E785 Hyperlipidemia, unspecified: Secondary | ICD-10-CM | POA: Diagnosis not present

## 2017-12-15 DIAGNOSIS — I129 Hypertensive chronic kidney disease with stage 1 through stage 4 chronic kidney disease, or unspecified chronic kidney disease: Secondary | ICD-10-CM | POA: Diagnosis not present

## 2017-12-15 DIAGNOSIS — Z87891 Personal history of nicotine dependence: Secondary | ICD-10-CM | POA: Diagnosis not present

## 2017-12-15 DIAGNOSIS — R600 Localized edema: Secondary | ICD-10-CM | POA: Diagnosis not present

## 2017-12-15 DIAGNOSIS — M549 Dorsalgia, unspecified: Secondary | ICD-10-CM | POA: Diagnosis not present

## 2017-12-15 DIAGNOSIS — J449 Chronic obstructive pulmonary disease, unspecified: Secondary | ICD-10-CM | POA: Diagnosis not present

## 2017-12-15 DIAGNOSIS — D696 Thrombocytopenia, unspecified: Secondary | ICD-10-CM | POA: Diagnosis not present

## 2017-12-15 LAB — BASIC METABOLIC PANEL
ANION GAP: 8 mmol/L — ABNORMAL LOW (ref 9–15)
BLOOD UREA NITROGEN: 21 mg/dL (ref 7–21)
BUN / CREAT RATIO: 12
CALCIUM: 8.7 mg/dL (ref 8.5–10.2)
CHLORIDE: 109 mmol/L — ABNORMAL HIGH (ref 98–107)
CREATININE: 1.72 mg/dL — ABNORMAL HIGH (ref 0.60–1.00)
EGFR MDRD AF AMER: 35 mL/min/{1.73_m2} — ABNORMAL LOW (ref >=60)
EGFR MDRD NON AF AMER: 29 mL/min/{1.73_m2} — ABNORMAL LOW (ref >=60)
GLUCOSE RANDOM: 122 mg/dL (ref 65–179)
SODIUM: 140 mmol/L (ref 135–145)

## 2017-12-15 LAB — CBC W/ AUTO DIFF
BASOPHILS ABSOLUTE COUNT: 0 10*9/L (ref 0.0–0.1)
BASOPHILS ABSOLUTE COUNT: 0 10*9/L (ref 0.0–0.1)
BASOPHILS RELATIVE PERCENT: 0.1 %
EOSINOPHILS ABSOLUTE COUNT: 0.1 10*9/L (ref 0.0–0.4)
EOSINOPHILS ABSOLUTE COUNT: 0.1 10*9/L (ref 0.0–0.4)
EOSINOPHILS RELATIVE PERCENT: 1.6 %
EOSINOPHILS RELATIVE PERCENT: 1.4 %
HEMATOCRIT: 31.8 % — ABNORMAL LOW (ref 36.0–46.0)
HEMATOCRIT: 31.1 % — ABNORMAL LOW (ref 36.0–46.0)
HEMOGLOBIN: 10.8 g/dL — ABNORMAL LOW (ref 12.0–16.0)
HEMOGLOBIN: 10 g/dL — ABNORMAL LOW (ref 12.0–16.0)
LARGE UNSTAINED CELLS: 4 % (ref 0–4)
LARGE UNSTAINED CELLS: 2 % (ref 0–4)
LYMPHOCYTES ABSOLUTE COUNT: 0.7 10*9/L — ABNORMAL LOW (ref 1.5–5.0)
LYMPHOCYTES ABSOLUTE COUNT: 0.5 10*9/L — ABNORMAL LOW (ref 1.5–5.0)
LYMPHOCYTES RELATIVE PERCENT: 17.7 %
LYMPHOCYTES RELATIVE PERCENT: 13 %
MEAN CORPUSCULAR HEMOGLOBIN CONC: 34 g/dL (ref 31.0–37.0)
MEAN CORPUSCULAR HEMOGLOBIN CONC: 32.1 g/dL (ref 31.0–37.0)
MEAN CORPUSCULAR HEMOGLOBIN: 32.8 pg (ref 26.0–34.0)
MEAN CORPUSCULAR VOLUME: 100.9 fL — ABNORMAL HIGH (ref 80.0–100.0)
MEAN CORPUSCULAR VOLUME: 102.4 fL — ABNORMAL HIGH (ref 80.0–100.0)
MEAN PLATELET VOLUME: 8.3 fL (ref 7.0–10.0)
MEAN PLATELET VOLUME: 8.2 fL (ref 7.0–10.0)
MONOCYTES ABSOLUTE COUNT: 0.2 10*9/L (ref 0.2–0.8)
MONOCYTES ABSOLUTE COUNT: 0.2 10*9/L (ref 0.2–0.8)
MONOCYTES RELATIVE PERCENT: 5.6 %
NEUTROPHILS ABSOLUTE COUNT: 2.8 10*9/L (ref 2.0–7.5)
NEUTROPHILS ABSOLUTE COUNT: 3.1 10*9/L (ref 2.0–7.5)
NEUTROPHILS RELATIVE PERCENT: 71.1 %
NEUTROPHILS RELATIVE PERCENT: 77.6 %
PLATELET COUNT: 24 10*9/L — ABNORMAL LOW (ref 150–440)
PLATELET COUNT: 30 10*9/L — ABNORMAL LOW (ref 150–440)
RED BLOOD CELL COUNT: 3.15 10*12/L — ABNORMAL LOW (ref 4.00–5.20)
RED BLOOD CELL COUNT: 3.04 10*12/L — ABNORMAL LOW (ref 4.00–5.20)
RED CELL DISTRIBUTION WIDTH: 19.1 % — ABNORMAL HIGH (ref 12.0–15.0)
RED CELL DISTRIBUTION WIDTH: 19 % — ABNORMAL HIGH (ref 12.0–15.0)
WBC ADJUSTED: 4 10*9/L — ABNORMAL LOW (ref 4.5–11.0)

## 2017-12-15 LAB — COMPREHENSIVE METABOLIC PANEL
ALBUMIN: 3.9 g/dL (ref 3.5–5.0)
ALKALINE PHOSPHATASE: 68 U/L (ref 38–126)
ALT (SGPT): 14 U/L — ABNORMAL LOW (ref 15–48)
ANION GAP: 10 mmol/L (ref 9–15)
AST (SGOT): 15 U/L (ref 14–38)
BILIRUBIN TOTAL: 0.9 mg/dL (ref 0.0–1.2)
BLOOD UREA NITROGEN: 23 mg/dL — ABNORMAL HIGH (ref 7–21)
BUN / CREAT RATIO: 12
CALCIUM: 8.9 mg/dL (ref 8.5–10.2)
CHLORIDE: 108 mmol/L — ABNORMAL HIGH (ref 98–107)
CO2: 25 mmol/L (ref 22.0–30.0)
CREATININE: 1.91 mg/dL — ABNORMAL HIGH (ref 0.60–1.00)
EGFR MDRD AF AMER: 31 mL/min/{1.73_m2} — ABNORMAL LOW (ref >=60)
EGFR MDRD NON AF AMER: 26 mL/min/{1.73_m2} — ABNORMAL LOW (ref >=60)
POTASSIUM: 4.5 mmol/L (ref 3.5–5.0)
PROTEIN TOTAL: 6.4 g/dL — ABNORMAL LOW (ref 6.5–8.3)

## 2017-12-15 LAB — URINALYSIS WITH CULTURE REFLEX
BILIRUBIN UA: NEGATIVE
GLUCOSE UA: NEGATIVE
KETONES UA: NEGATIVE
NITRITE UA: NEGATIVE
PH UA: 5 (ref 5.0–9.0)
RBC UA: 39 /HPF — ABNORMAL HIGH (ref <=4)
SPECIFIC GRAVITY UA: 1.007 (ref 1.003–1.030)
SQUAMOUS EPITHELIAL: <1 /HPF (ref 0–5)
WBC UA: 4 /HPF (ref 0–5)

## 2017-12-15 LAB — LACTATE DEHYDROGENASE: Lactate dehydrogenase:CCnc:Pt:Ser/Plas:Qn:: 547

## 2017-12-15 LAB — MEAN CORPUSCULAR VOLUME: Lab: 100.9 — ABNORMAL HIGH

## 2017-12-15 LAB — ANISOCYTOSIS

## 2017-12-15 LAB — KETONES UA: Lab: NEGATIVE

## 2017-12-15 LAB — CO2: Carbon dioxide:SCnc:Pt:Ser/Plas:Qn:: 23

## 2017-12-15 LAB — SMEAR REVIEW

## 2017-12-15 LAB — CALCIUM: Calcium:MCnc:Pt:Ser/Plas:Qn:: 8.9

## 2017-12-15 MED ORDER — ONDANSETRON HCL 4 MG TABLET
ORAL_TABLET | Freq: Three times a day (TID) | ORAL | 0 refills | 0 days | Status: CP | PRN
Start: 2017-12-15 — End: 2017-12-20

## 2017-12-15 MED ORDER — OXYCODONE 5 MG TABLET
ORAL_TABLET | Freq: Four times a day (QID) | ORAL | 0 refills | 0 days | Status: CP | PRN
Start: 2017-12-15 — End: 2017-12-20

## 2017-12-15 MED ORDER — TAMSULOSIN 0.4 MG CAPSULE
ORAL_CAPSULE | Freq: Every day | ORAL | 0 refills | 0.00000 days | Status: SS
Start: 2017-12-15 — End: 2018-03-20

## 2017-12-15 NOTE — Unmapped (Unsigned)
[name] presents to the infusion center after being seen by primary oncology team for IVF for elevated creatinine.  She has had recent UTI and now has 10/10 flank pain, L>R.  UA with hematuria, no evidence of recurrent UTI. Unfortunately I believe this requires assessment by urology and they are unable to see pt in clinic today.  They rec proceeding to ED for proper workup.  Pt will be escorted to the ED.  5mg  PO oxycodone and 1mg  IV morphine administered in infusion clinic.

## 2017-12-15 NOTE — Unmapped (Signed)
Pt from infusion clinic.   Having abdominal pain.    Reports urine was fine.     Pain described as across back and down left side.     Arrives with IV in left arm.

## 2017-12-15 NOTE — Unmapped (Addendum)
Let's give you some fluids.    Let's see what the urinalysis shows.    We are trying to get insurance approval for a CT scan of your abdomen.    From a leukemia perspective, things are going well. Continue the Idhifa and we'll see you back in four weeks.    Lab on 12/15/2017   Component Date Value Ref Range Status   ??? LDH 12/15/2017 547  338 - 610 U/L Final   ??? Sodium 12/15/2017 143  135 - 145 mmol/L Final   ??? Potassium 12/15/2017 4.5  3.5 - 5.0 mmol/L Final   ??? Chloride 12/15/2017 108* 98 - 107 mmol/L Final   ??? CO2 12/15/2017 25.0  22.0 - 30.0 mmol/L Final   ??? BUN 12/15/2017 23* 7 - 21 mg/dL Final   ??? Creatinine 12/15/2017 1.91* 0.60 - 1.00 mg/dL Final   ??? BUN/Creatinine Ratio 12/15/2017 12   Final   ??? EGFR MDRD Non Af Amer 12/15/2017 26* >=60 mL/min/1.50m2 Final   ??? EGFR MDRD Af Amer 12/15/2017 31* >=60 mL/min/1.54m2 Final   ??? Anion Gap 12/15/2017 10  9 - 15 mmol/L Final   ??? Glucose 12/15/2017 112  65 - 179 mg/dL Final   ??? Calcium 16/06/9603 8.9  8.5 - 10.2 mg/dL Final   ??? Albumin 54/05/8118 3.9  3.5 - 5.0 g/dL Final   ??? Total Protein 12/15/2017 6.4* 6.5 - 8.3 g/dL Final   ??? Total Bilirubin 12/15/2017 0.9  0.0 - 1.2 mg/dL Final   ??? AST 14/78/2956 15  14 - 38 U/L Final   ??? ALT 12/15/2017 14* 15 - 48 U/L Final   ??? Alkaline Phosphatase 12/15/2017 68  38 - 126 U/L Final   ??? WBC 12/15/2017 4.0* 4.5 - 11.0 10*9/L Final   ??? RBC 12/15/2017 3.15* 4.00 - 5.20 10*12/L Final   ??? HGB 12/15/2017 10.8* 12.0 - 16.0 g/dL Final   ??? HCT 21/30/8657 31.8* 36.0 - 46.0 % Final   ??? MCV 12/15/2017 100.9* 80.0 - 100.0 fL Final   ??? MCH 12/15/2017 34.3* 26.0 - 34.0 pg Final   ??? MCHC 12/15/2017 34.0  31.0 - 37.0 g/dL Final   ??? RDW 84/69/6295 19.1* 12.0 - 15.0 % Final   ??? MPV 12/15/2017 8.3  7.0 - 10.0 fL Final   ??? Platelet 12/15/2017 24* 150 - 440 10*9/L Final   ??? Variable HGB Concentration 12/15/2017 Slight* Not Present Final   ??? Neutrophils % 12/15/2017 71.1  % Final   ??? Lymphocytes % 12/15/2017 17.7  % Final   ??? Monocytes % 12/15/2017 5.8 % Final   ??? Eosinophils % 12/15/2017 1.6  % Final   ??? Basophils % 12/15/2017 0.1  % Final   ??? Absolute Neutrophils 12/15/2017 2.8  2.0 - 7.5 10*9/L Final   ??? Absolute Lymphocytes 12/15/2017 0.7* 1.5 - 5.0 10*9/L Final   ??? Absolute Monocytes 12/15/2017 0.2  0.2 - 0.8 10*9/L Final   ??? Absolute Eosinophils 12/15/2017 0.1  0.0 - 0.4 10*9/L Final   ??? Absolute Basophils 12/15/2017 0.0  0.0 - 0.1 10*9/L Final   ??? Large Unstained Cells 12/15/2017 4  0 - 4 % Final   ??? Microcytosis 12/15/2017 Slight* Not Present Final   ??? Macrocytosis 12/15/2017 Marked* Not Present Final   ??? Anisocytosis 12/15/2017 Moderate* Not Present Final   ??? Hypochromasia 12/15/2017 Slight* Not Present Final

## 2017-12-15 NOTE — Unmapped (Unsigned)
Jessica Wilson presents to the infusion clinic for complaints of back pain. #24g PIV LAC IVF NS @500ml /hr for two hours. Oxycodone IR 5mg  administered. VSS. Alert and oriented x4.     Jessica Angie Dakota Vanwart,NP she will order morphine and patient is to be sent to the emergency department for further workup.    1538-morphine 1mg  IV administered. Pain 10/10 throbbing.    Jessica Wilson assisted to wheelchair and transported to the emergency department by [facility], CST. Patient alert and oriented x4 and in stable condition at time of transfer. IVF infusing.  Personal belongings sent with the patient. Buck Mam states she notified ED staff of patients arrival.

## 2017-12-15 NOTE — Unmapped (Signed)
[facility] Leukemia Clinic Follow-up    Patient Name: Jessica Wilson  Patient Age: 77 y.o.  Encounter Date: 12/15/2017    Primary Care Provider:  Kerman Passey, MD    Referring Physician:  Thyra Breed, NP  [address]  [place], [place] [address]    Reason for visit:  Follow up of MDS progressed to AML, on enasidenib    History of Present Illness:  We had the pleasure of seeing Jessica Wilson in the Leukemia Clinic at the [facility] on 12/15/2017.  She is a 77 y.o. 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      Current therapy is enasidenib.    Her oncologic history is as follows:    Oncology History    Referring/Local Oncologist: Dr. Louretta Shorten, [facility]    Diagnosis: MDS with Excess Blasts-2    Genetics:   Karyotype/FISH: 46XX     Molecular Genetics: not performed    Pertinent Phenotypic data: no aberrant immunophenotype by flow cytometry, however blasts stained by IHC for CD117, MPO.  Only 5% marrow cells stained for CD34.    Disease-specific prognostic estimation: IPSS-R high risk, median OS 1.6 years, with 25% AML risk at 1.4 years            MDS (myelodysplastic syndrome), high grade (CMS-HCC)    04/17/2016 Initial Diagnosis     MDS (myelodysplastic syndrome), high grade (RAF-HCC)       04/29/2016 -  Chemotherapy     Azacitidine cycle 1: 75 mg/m2 [place] days 1-7 of 28-day cycles         05/27/2016 -  Chemotherapy     Azacitidine cycle 2: 75 mg/m2 [place] days 1-7 of 28 day cycles         06/24/2016 -  Chemotherapy     Azacitidine cycle 3: 75 mg/m2 [place] days 1-7 of 28 day cycle         07/22/2016 -  Chemotherapy     Azacitidine cycle 4: 75 mg/m2 [place] days 1-7 of 28 day cycle         08/19/2016 -  Chemotherapy     Azacitidine cycle 5: 75 mg/m2 [place] x 7 days of 28 day cycle         09/16/2016 -  Chemotherapy     Azacitidine cycle 6: 75 mbg/m2 [place] x 7 days of 28 day cycle         10/14/2016 -  Chemotherapy     Azacitidine cycle 7: 75 mg/m2 [place] x 7 days of 28 day cycle 12/09/2016 -  Chemotherapy     Azacitidine cycle 8: 60 mg/m2 [place] x 7 days of 28 day (cycle reduced by 20% due to hematologic toxicity)         01/13/2017 -  Chemotherapy     Azacitidine cycle 9: 60 mg/m2 [place] x 5 days of 28 day cycle (reduced by 2 days and 20% per dose due to hematologic toxicity)         05/29/2017 Progression     Given increasing transfusion requirements, repeat bone marrow biopsy done, now with 35% blasts and meets criteria for progression to AML.           Acute myeloid leukemia not having achieved remission (CMS-HCC)    06/02/2017 Initial Diagnosis     Acute myeloid leukemia not having achieved remission         06/28/2017 - 07/17/2017 Chemotherapy  enasidenib 100 mg by mouth daily         07/17/2017 Adverse Reaction     Acute abdominal pain, splenomegaly, AKI , elevated transaminases.  Enasidenib held thru 11/13 and treated empirically for differentiation syndrome with dexamethasone. Resumed enasidenib 07/23/17         07/23/2017 -  Chemotherapy     enasidenib 100 mg PO daily          Interim History:  Since last seen here, [name] has continued on enasidenib. She is pleased with the fact that it has been weeks since she's needed a transfusion and asks if Dr. Malen Gauze and I are happy.    Otherwise, she is in severe pain. On Saturday, she began having intermittent abdominal pain. Then it was constant. On Sunday, the pain migrated to her back. She has been vomiting, with near constant nausea from the pain. She has noticed blood in her urine. Denies fevers. She has taken tylenol three times with little effect, last at 6 am.    Otherwise, she denies new constitutional symptoms such as anorexia, weight loss night sweats or unexplained fevers.  Furthermore, she denies symptoms of marrow failure: unexplained bleeding or bruising, recurrent or unexplained intercurrent infections, dyspnea on exertion, lightheadedness, palpitations or chest pain.  There have been no new or unexplained pains or self-identified masses, swelling or enlarged lymph nodes.    Past Medical, Surgical and Family History were reviewed and pertinent updates were made in the Electronic Medical Record    Review of Systems:  Other than as reported above in the interim history, all other systems reviewed were negative.    ECOG Performance Status: 2    Medications:    Current Outpatient Medications   Medication Sig Dispense Refill   ??? acetaminophen (TYLENOL) 325 MG tablet Take 650 mg by mouth every six (6) hours as needed for pain.     ??? acyclovir (ZOVIRAX) 400 MG tablet Take 400 mg by mouth Two (2) times a day.      ??? buPROPion (WELLBUTRIN SR) 150 MG 12 hr tablet 2 tabs po daily  1   ??? chlorhexidine (PERIDEX) 0.12 % solution 15 mL by Mouth route Two (2) times a day. 473 mL 4   ??? clonazePAM (KLONOPIN) 0.5 MG tablet Take 0.5 mg by mouth two (2) times a day as needed for anxiety.      ??? diltiazem (CARDIZEM CD) 180 MG 24 hr capsule Take 180 mg by mouth daily.     ??? doxycycline (PERIOSTAT) 20 MG tablet Take 1 tablet (20 mg total) by mouth Two (2) times a day. (Patient not taking: Reported on 12/15/2017) 60 tablet 0   ??? enasidenib (IDHIFA) 100 mg tablet Take 100 mg by mouth daily.     ??? lidocaine-prilocaine (EMLA) cream   1   ??? ondansetron (ZOFRAN) 4 MG tablet Take 4 mg by mouth every eight (8) hours as needed for nausea.     ??? prochlorperazine (COMPAZINE) 10 MG tablet Take 10 mg by mouth every six (6) hours as needed for nausea.     ??? sertraline (ZOLOFT) 100 MG tablet Take 150 mg by mouth daily.      ??? tranexamic acid (LYSTEDA) 650 mg Tab tablet Take 2 tabs daily as needed for bleeding 60 tablet 2   ??? umeclidinium-vilanterol (ANORO ELLIPTA) 62.5-25 mcg/actuation inhaler Inhale 1 puff daily.       No current facility-administered medications for this visit.      Facility-Administered Medications  Ordered in Other Visits   Medication Dose Route Frequency Provider Last Rate Last Dose   ??? traMADol (ULTRAM) 50 mg tablet              Vital Signs:  Vitals: 12/15/17 1200   BP: 163/71   Pulse: 77   Resp: 16   Temp: 36.8 ??C (98.3 ??F)   SpO2: 98%     Physical Exam:  General: Leaning back on clinic chair in obvious pain  HEENT: Stable disconjugate gaze. No scleral icterus or conjunctival injection. Nares show no bleeding.    Lymph node exam: Deferred  Heart:  Regular rate and rhythm. S1, S2. No murmurs, gallops or rubs.  Lungs:  Breathing is unlabored and patient is speaking full sentences with ease.  No stridor.  Auscultation of lung fields reveals normal air movement without rales, ronchi or crackles.    GI:  LLQ discomfort on palpation. Otherwise, no distention or pain on palpation.  Bowel sounds are present and normal in quality.  No palpable hepatomegaly or splenomegaly.  No palpable masses.  Skin:  No rashes, petechiae or purpura.  No areas of skin breakdown.  Musculoskeletal:  No grossly-evident joint effusions or deformities.  Range of motion about the shoulder, elbow, hips and knees is grossly normal. Mild CVA tenderness on right side.  Psychiatric:  Alert and oriented to person, place, time and situation.  Range of affect is appropriate.    Neurologic:    Gait is normal.  Cerebellar tasks are completed with ease and are symmetric.  Extremities:  Appear well-perfused. No clubbing, but there is 1+ bilateral edema.  No cyanosis.    Relevant Laboratory, radiology and pathology results:  I personally viewed the most recent internal records and labs and discussed the available results with the patient or family.  A summary of results follows:  Lab on 12/15/2017   Component Date Value Ref Range Status   ??? LDH 12/15/2017 547  338 - 610 U/L Final   ??? Sodium 12/15/2017 143  135 - 145 mmol/L Final   ??? Potassium 12/15/2017 4.5  3.5 - 5.0 mmol/L Final   ??? Chloride 12/15/2017 108* 98 - 107 mmol/L Final   ??? CO2 12/15/2017 25.0  22.0 - 30.0 mmol/L Final   ??? BUN 12/15/2017 23* 7 - 21 mg/dL Final   ??? Creatinine 12/15/2017 1.91* 0.60 - 1.00 mg/dL Final   ??? BUN/Creatinine Ratio 12/15/2017 12   Final   ??? EGFR MDRD Non Af Amer 12/15/2017 26* >=60 mL/min/1.66m2 Final   ??? EGFR MDRD Af Amer 12/15/2017 31* >=60 mL/min/1.23m2 Final   ??? Anion Gap 12/15/2017 10  9 - 15 mmol/L Final   ??? Glucose 12/15/2017 112  65 - 179 mg/dL Final   ??? Calcium 16/06/9603 8.9  8.5 - 10.2 mg/dL Final   ??? Albumin 54/05/8118 3.9  3.5 - 5.0 g/dL Final   ??? Total Protein 12/15/2017 6.4* 6.5 - 8.3 g/dL Final   ??? Total Bilirubin 12/15/2017 0.9  0.0 - 1.2 mg/dL Final   ??? AST 14/78/2956 15  14 - 38 U/L Final   ??? ALT 12/15/2017 14* 15 - 48 U/L Final   ??? Alkaline Phosphatase 12/15/2017 68  38 - 126 U/L Final   ??? WBC 12/15/2017 4.0* 4.5 - 11.0 10*9/L Final   ??? RBC 12/15/2017 3.15* 4.00 - 5.20 10*12/L Final   ??? HGB 12/15/2017 10.8* 12.0 - 16.0 g/dL Final   ??? HCT 21/30/8657 31.8* 36.0 - 46.0 % Final   ???  MCV 12/15/2017 100.9* 80.0 - 100.0 fL Final   ??? MCH 12/15/2017 34.3* 26.0 - 34.0 pg Final   ??? MCHC 12/15/2017 34.0  31.0 - 37.0 g/dL Final   ??? RDW 29/56/2130 19.1* 12.0 - 15.0 % Final   ??? MPV 12/15/2017 8.3  7.0 - 10.0 fL Final   ??? Platelet 12/15/2017 24* 150 - 440 10*9/L Final   ??? Variable HGB Concentration 12/15/2017 Slight* Not Present Final   ??? Neutrophils % 12/15/2017 71.1  % Final   ??? Lymphocytes % 12/15/2017 17.7  % Final   ??? Monocytes % 12/15/2017 5.8  % Final   ??? Eosinophils % 12/15/2017 1.6  % Final   ??? Basophils % 12/15/2017 0.1  % Final   ??? Absolute Neutrophils 12/15/2017 2.8  2.0 - 7.5 10*9/L Final   ??? Absolute Lymphocytes 12/15/2017 0.7* 1.5 - 5.0 10*9/L Final   ??? Absolute Monocytes 12/15/2017 0.2  0.2 - 0.8 10*9/L Final   ??? Absolute Eosinophils 12/15/2017 0.1  0.0 - 0.4 10*9/L Final   ??? Absolute Basophils 12/15/2017 0.0  0.0 - 0.1 10*9/L Final   ??? Large Unstained Cells 12/15/2017 4  0 - 4 % Final   ??? Microcytosis 12/15/2017 Slight* Not Present Final   ??? Macrocytosis 12/15/2017 Marked* Not Present Final   ??? Anisocytosis 12/15/2017 Moderate* Not Present Final   ??? Hypochromasia 12/15/2017 Slight* Not Present Final Assessment:  [name] is a 77 y.o. year old female with AML progressed from previous Myelodysplastic Syndrome s/p 12 cycles of azacitidine prior to progression to AML. She initially had hematologic improvement but had eventual progression requiring more transfusions and with 38% blasts in the bone marrow. She was started on enasidenib on 06/28/17, and had clearance of peripheral blood blasts, avoidance of severe neutropenia and improvement in her hemoglobin.  She still has significant thrombocytopenia.    Today, [name] is in severe pain, with what appears to be a kidney stone. A UA is negative for infection. She has not responded to the tramadol I gave her in clinic. Given the increase in her creatinine and uncontrolled pain, I have asked our NP colleagues in the infusion center to give her fluids and further pain medication while we attempt to get insurance authorization for a CT A/P.    Regarding her AML, I reviewed with [name] that the fact that she hasn't needed a transfusion in the past 5-6 weeks shows that she has had an excellent palliative benefit from this therapy. We will see her back in four weeks.    Plan and Recommendations:  **AML, secondary after MDS  - Continue enasidenib 100 mg daily until progression or intolerance.  Given the fact that she is not on a clinical trial and is having obvious clinical benefit, we do not feel the need to document depth of remission with a bone marrow biopsy.  - Labs 2x/week locally w/ PRN transfusions.  Hopefully with a deeper response, platelet monitoring needs will be less frequent.  - RTC in four weeks  - She is likely through the window of risk from differentiation syndrome from this therapy.  Nonetheless, if she develops fever, dyspnea, hypoxia or other end organ issues, it would be reasonable to entertain the diagnosis of differentiation syndrome.    **Tumor lysis prophylaxis: She no longer needs allopurinol.    **Hematologic support: She will receive irradiated blood products when available.  If she has emergent bleeding complications, nonirradiated blood products are acceptable.  In general,  she will receive red blood cell transfusion for hemoglobin less than 8 g/dL and platelets for platelet count less than 10,000 or active bleeding.      **Infection prophylaxis: Given resolution of neutropenia, we only recommend that she remain on valacyclovir.    **AKI  - Fluid bolus in infusion center.    **Blood urine/flank pain:  - Working to get insurance authorization for outpatient CT A/P.   - tramadol 50 mg x 1 in clinic ineffective. Has asked infusion NP to provide further pain medication.  Note: patient eventually sent from infusion center to ER where she was able to get CT scan, which showed kidney stone. She was discharged with pain medication and urology f/u.    Dr. Malen Gauze was available.    Mariana Kaufman, AGPCNP-BC  Nurse Practitioner  Hematology/Oncology  [facility]  12/15/2017

## 2017-12-15 NOTE — Unmapped (Signed)
If you feel like this is an emergency please call 911.  For appointments or questions Monday through Friday 8AM-5PM please call [phone] or Toll Free [phone]. For Medical questions or concerns ask for the Nurse Triage Line.  On Nights, Weekends, and Holidays call [phone] and ask for the Oncologist on Call.  Reasons to call the Nurse Triage Line:  Fever of 100.5 or greater  Nausea and/or vomiting not relived with nausea medicine  Diarrhea or constipation  Severe pain not relieved with usual pain regimen  Shortness of breath  Uncontrolled bleeding  Mental status changes

## 2017-12-15 NOTE — Unmapped (Signed)
Lab drawn and sent for analysis.

## 2017-12-15 NOTE — Unmapped (Signed)
Jessica Wilson is a 77 y.o. female with AML who I am seeing in clinic today for oral chemotherapy monitoring    Encounter Date: 12/15/2017    Current Treatment: enasidenib 100 mg once daily    Review of Symptoms:   Adverse Effects    Abdominal pain:  Pos (Comment: Pain started Saturday; 4/6)  Nausea:  Pos  Vomiting:  Pos  Bruises/bleeds easily:  Pos (Comment: Urine has blood)         Oncologic History:  Oncology History    Referring/Local Oncologist: Dr. Louretta Shorten, [facility]    Diagnosis: MDS with Excess Blasts-2    Genetics:   Karyotype/FISH: 46XX     Molecular Genetics: not performed    Pertinent Phenotypic data: no aberrant immunophenotype by flow cytometry, however blasts stained by IHC for CD117, MPO.  Only 5% marrow cells stained for CD34.    Disease-specific prognostic estimation: IPSS-R high risk, median OS 1.6 years, with 25% AML risk at 1.4 years            MDS (myelodysplastic syndrome), high grade (CMS-HCC)    04/17/2016 Initial Diagnosis     MDS (myelodysplastic syndrome), high grade (RAF-HCC)       04/29/2016 -  Chemotherapy     Azacitidine cycle 1: 75 mg/m2 [place] days 1-7 of 28-day cycles         05/27/2016 -  Chemotherapy     Azacitidine cycle 2: 75 mg/m2 [place] days 1-7 of 28 day cycles         06/24/2016 -  Chemotherapy     Azacitidine cycle 3: 75 mg/m2 [place] days 1-7 of 28 day cycle         07/22/2016 -  Chemotherapy     Azacitidine cycle 4: 75 mg/m2 [place] days 1-7 of 28 day cycle         08/19/2016 -  Chemotherapy     Azacitidine cycle 5: 75 mg/m2 [place] x 7 days of 28 day cycle         09/16/2016 -  Chemotherapy     Azacitidine cycle 6: 75 mbg/m2 [place] x 7 days of 28 day cycle         10/14/2016 -  Chemotherapy     Azacitidine cycle 7: 75 mg/m2 [place] x 7 days of 28 day cycle         12/09/2016 -  Chemotherapy     Azacitidine cycle 8: 60 mg/m2 [place] x 7 days of 28 day (cycle reduced by 20% due to hematologic toxicity)         01/13/2017 -  Chemotherapy     Azacitidine cycle 9: 60 mg/m2 [place] x 5 days of 28 day cycle (reduced by 2 days and 20% per dose due to hematologic toxicity)         05/29/2017 Progression     Given increasing transfusion requirements, repeat bone marrow biopsy done, now with 35% blasts and meets criteria for progression to AML.           Acute myeloid leukemia not having achieved remission (CMS-HCC)    06/02/2017 Initial Diagnosis     Acute myeloid leukemia not having achieved remission         06/28/2017 - 07/17/2017 Chemotherapy     enasidenib 100 mg by mouth daily         07/17/2017 Adverse Reaction     Acute abdominal pain, splenomegaly, AKI , elevated transaminases.  Enasidenib held thru 11/13 and treated empirically for differentiation  syndrome with dexamethasone. Resumed enasidenib 07/23/17         07/23/2017 -  Chemotherapy     enasidenib 100 mg PO daily            Weight and Vitals:  Wt Readings from Last 3 Encounters:   11/17/17 (!) 102.8 kg (226 lb 11.2 oz)   10/20/17 (!) 103.6 kg (228 lb 6.3 oz)   10/20/17 (!) 104.1 kg (229 lb 6.4 oz)     Temp Readings from Last 3 Encounters:   12/15/17 36.8 ??C (98.3 ??F) (Axillary)   11/17/17 36.6 ??C (97.9 ??F) (Oral)   10/20/17 36.7 ??C (98.1 ??F) (Oral)     BP Readings from Last 3 Encounters:   12/15/17 163/71   11/17/17 188/75   11/17/17 145/63     Pulse Readings from Last 3 Encounters:   12/15/17 77   11/17/17 64   11/17/17 66       Pertinent Labs:  Lab on 12/15/2017   Component Date Value Ref Range Status   ??? WBC 12/15/2017 4.0* 4.5 - 11.0 10*9/L Final   ??? RBC 12/15/2017 3.15* 4.00 - 5.20 10*12/L Final   ??? HGB 12/15/2017 10.8* 12.0 - 16.0 g/dL Final   ??? HCT 16/06/9603 31.8* 36.0 - 46.0 % Final   ??? MCV 12/15/2017 100.9* 80.0 - 100.0 fL Final   ??? MCH 12/15/2017 34.3* 26.0 - 34.0 pg Final   ??? MCHC 12/15/2017 34.0  31.0 - 37.0 g/dL Final   ??? RDW 54/05/8118 19.1* 12.0 - 15.0 % Final   ??? MPV 12/15/2017 8.3  7.0 - 10.0 fL Final   ??? Platelet 12/15/2017 24* 150 - 440 10*9/L Final   ??? Variable HGB Concentration 12/15/2017 Slight* Not Present Final   ??? Neutrophils % 12/15/2017 71.1  % Final   ??? Lymphocytes % 12/15/2017 17.7  % Final   ??? Monocytes % 12/15/2017 5.8  % Final   ??? Eosinophils % 12/15/2017 1.6  % Final   ??? Basophils % 12/15/2017 0.1  % Final   ??? Absolute Neutrophils 12/15/2017 2.8  2.0 - 7.5 10*9/L Final   ??? Absolute Lymphocytes 12/15/2017 0.7* 1.5 - 5.0 10*9/L Final   ??? Absolute Monocytes 12/15/2017 0.2  0.2 - 0.8 10*9/L Final   ??? Absolute Eosinophils 12/15/2017 0.1  0.0 - 0.4 10*9/L Final   ??? Absolute Basophils 12/15/2017 0.0  0.0 - 0.1 10*9/L Final   ??? Large Unstained Cells 12/15/2017 4  0 - 4 % Final   ??? Microcytosis 12/15/2017 Slight* Not Present Final   ??? Macrocytosis 12/15/2017 Marked* Not Present Final   ??? Anisocytosis 12/15/2017 Moderate* Not Present Final   ??? Hypochromasia 12/15/2017 Slight* Not Present Final       Allergies:   Allergies   Allergen Reactions   ??? Macrobid [Nitrofurantoin Monohyd/M-Cryst]        Drug Interactions:   Drug Interactions    Drug interactions evaluated:  yes  Clinically relevant drug interactions identified:  no         Current Medications:  Current Outpatient Medications   Medication Sig Dispense Refill   ??? acetaminophen (TYLENOL) 325 MG tablet Take 650 mg by mouth every six (6) hours as needed for pain.     ??? acyclovir (ZOVIRAX) 400 MG tablet Take 400 mg by mouth Two (2) times a day.      ??? buPROPion (WELLBUTRIN SR) 150 MG 12 hr tablet 2 tabs po daily  1   ??? chlorhexidine (PERIDEX) 0.12 %  solution 15 mL by Mouth route Two (2) times a day. 473 mL 4   ??? clonazePAM (KLONOPIN) 0.5 MG tablet Take 0.5 mg by mouth two (2) times a day as needed for anxiety.      ??? diltiazem (CARDIZEM CD) 180 MG 24 hr capsule Take 180 mg by mouth daily.     ??? enasidenib (IDHIFA) 100 mg tablet Take 100 mg by mouth daily.     ??? lidocaine-prilocaine (EMLA) cream   1   ??? ondansetron (ZOFRAN) 4 MG tablet Take 4 mg by mouth every eight (8) hours as needed for nausea.     ??? prochlorperazine (COMPAZINE) 10 MG tablet Take 10 mg by mouth every six (6) hours as needed for nausea.     ??? sertraline (ZOLOFT) 100 MG tablet Take 150 mg by mouth daily.      ??? tranexamic acid (LYSTEDA) 650 mg Tab tablet Take 2 tabs daily as needed for bleeding 60 tablet 2   ??? umeclidinium-vilanterol (ANORO ELLIPTA) 62.5-25 mcg/actuation inhaler Inhale 1 puff daily.     ??? doxycycline (PERIOSTAT) 20 MG tablet Take 1 tablet (20 mg total) by mouth Two (2) times a day. (Patient not taking: Reported on 12/15/2017) 60 tablet 0     No current facility-administered medications for this visit.        Adherence:   Medication Adherence    Patient Reported X Missed Doses in the Last Month:  0  Informant:  patient  Reliability of Informant:  reliable  Provider-Estimated Medication Adherence Level:  90-100%  Medication Assistance Program  Refill Coordination  Shipping Information         Education Points:   Patient Counseling    Counseled the patient on the following:  doses and administration discussed, possible adverse effects and management discussed, possible drug and prescription drug interactions discussed, lab monitoring and follow-up discussed, cost of medications and cost implications discussed, adherence and missed doses discussed, pharmacy contact information discussed          Pharmacy: [facility]     Confirmed Address and Phone Number: Yes     Medication Access:   [facility] Referral: [place] Approved  This referral has been APPROVED with the details below:  ??  Medication (Brand/Generic): IDHIFA  Insurance/Contact: [employer]  Authorization ID/Case#: [id number]  Coverage Dates: Through 09/08/18  ??  Final test claim:  Dispensing Specialty Pharmacy: [facility]  Anticipated Co-Pay: $8.35      Assessment: Jessica Wilson is a 77 y.o. female with AML being treated currently with enasidenib.  Today she is experiencing significant abdominal and back pain with frank hematuria.     Plan:   -continue enasidenib 100 mg once daily  -UA/workup for urgent symptoms    F/u:  Future Appointments Date Time Provider Department Center   02/05/2018  2:30 PM Twyla P Sheppard [facility]       I spent 15 minutes with Ms.Minardi in direct patient care.

## 2017-12-16 NOTE — Unmapped (Signed)
Patient rounds completed. The following patient needs were addressed:  Pain, Toileting, Positioning;   SUPINE, Personal Belongings, Plan of Care, Call Bell in Reach and Bed Position Low .

## 2017-12-16 NOTE — Unmapped (Signed)
Bed in lowest position.  Brakes of bed on.  Call bell within reach.Patient resting quietly.  No apparent distress.  No changes related to chief complaint.  Color within normal limits.

## 2017-12-16 NOTE — Unmapped (Signed)
[facility]  Emergency Department Provider Note       ED Clinical Impression     Final diagnoses:   Kidney stone (Primary)        Impression, ED Course, Assessment and Plan     Impression: Jessica Wilson is a 77 yo F with a pmhx of AML who presents to the ED with severe L flank pain.    6:51 PM - Current differential includes kidney stones due to hematuria, diverticulitis given that her pain was reproducible over LLQ, and/or musculoskeletal. Less likely pylonephritis given stable vital signs and no WBCs in UA. Will plan to CT w/o contrast, give more pain medications, and repeat BMP and reevaluate afterwards and finish her 1L bolus she came in with from the infusion clinic.     8:51 PM - BMP shows elevated Cr but improved from prior (1.7 down from 1.9). Per CT scan read: 0.3cm left ureteral renal calculus with associated mild L hydronephrosis and perinephric and periureteral stranding. Addtl. bilateral non obstructing calculi. UA without infection and creatinine improved with fluids.  Will plan to discharge patient with zofran, oxycodone, and flomax. Will instruct patient to f/u with urology within the next week.         Additional Medical Decision Making     I have reviewed the vital signs and the nursing notes. Labs and radiology results that were available during my care of the patient were independently reviewed by me and considered in my medical decision making.       I independently visualized the radiology images.   I reviewed the patient's prior medical records.       History     Chief Complaint  Abdominal Pain      History of Present Illness   Jessica Wilson is a 77 y.o. female with a pmhx of AML who presents to the ED with L flank pain.    Patient was in the infusion clinic this morning where she was sent from to the ED for her flank pain. She states it started Saturday and worsened on Sunday. The pain has been constant, sharp, and states that it has felt 10/10 since Sunday. She reports having blood in her urine over the weekend as well which is unusual for her. She describes the pain going across her back with the L side worse. She also states that her L back pain radiates toward the L side of her stomach but not down her leg. She says the pain has made her feel sick and has had a lot of nausea and vomiting over the weekend. She has not had any recent changes in her medications and states that she has never had any kidney stones. No recent travel history.  She denies any fevers.  She reports her pain does not improve with tylenol.            Past Medical History:   Diagnosis Date   ??? Anxiety disorder    ??? Chronic kidney disease    ??? Depression    ??? Diabetes mellitus without complication (CMS-HCC)    ??? Fibrocystic breast    ??? GERD (gastroesophageal reflux disease)    ??? Hyperlipidemia    ??? Hypertension    ??? MDS (myelodysplastic syndrome), high grade (CMS-HCC) 04/17/2016   ??? Nephrolithiasis    ??? Obesity    ??? Sleep apnea        Patient Active Problem List   Diagnosis   ??? MDS (myelodysplastic syndrome),  high grade (CMS-HCC)   ??? Acute myeloid leukemia not having achieved remission (CMS-HCC)   ??? Anemia   ??? Arteriosclerosis of coronary artery   ??? COPD, mild (CMS-HCC)   ??? Diabetes mellitus, type 2 (CMS-HCC)   ??? Dyslipidemia   ??? Essential (primary) hypertension   ??? Proteinuria   ??? Thrombocytopenia (CMS-HCC)   ??? Paroxysmal atrial fibrillation (CMS-HCC)   ??? Panic attack       Past Surgical History:   Procedure Laterality Date   ??? CARDIAC CATHETERIZATION     ??? CHOLECYSTECTOMY  02/24/2015   ??? DIAGNOSTIC LAPAROSCOPY      removal of benign abdominal tumor   ??? EXCISION / BIOPSY BREAST / NIPPLE / DUCT Right Age 66   ??? HYSTERECTOMY  age 83         Current Facility-Administered Medications:   ???  oxyCODONE (ROXICODONE) immediate release tablet 5 mg, 5 mg, Oral, Once, Merrie Roof, MD    Current Outpatient Medications:   ???  acetaminophen (TYLENOL) 325 MG tablet, Take 650 mg by mouth every six (6) hours as needed for pain., Disp: , Rfl:   ???  acyclovir (ZOVIRAX) 400 MG tablet, Take 400 mg by mouth Two (2) times a day. , Disp: , Rfl:   ???  buPROPion (WELLBUTRIN SR) 150 MG 12 hr tablet, 2 tabs po daily, Disp: , Rfl: 1  ???  chlorhexidine (PERIDEX) 0.12 % solution, 15 mL by Mouth route Two (2) times a day., Disp: 473 mL, Rfl: 4  ???  clonazePAM (KLONOPIN) 0.5 MG tablet, Take 0.5 mg by mouth two (2) times a day as needed for anxiety. , Disp: , Rfl:   ???  diltiazem (CARDIZEM CD) 180 MG 24 hr capsule, Take 180 mg by mouth daily., Disp: , Rfl:   ???  doxycycline (PERIOSTAT) 20 MG tablet, Take 1 tablet (20 mg total) by mouth Two (2) times a day. (Patient not taking: Reported on 12/15/2017), Disp: 60 tablet, Rfl: 0  ???  enasidenib (IDHIFA) 100 mg tablet, Take 100 mg by mouth daily., Disp: , Rfl:   ???  lidocaine-prilocaine (EMLA) cream, , Disp: , Rfl: 1  ???  ondansetron (ZOFRAN) 4 MG tablet, Take 1 tablet (4 mg total) by mouth every eight (8) hours as needed for nausea. for up to 5 days, Disp: 15 tablet, Rfl: 0  ???  oxyCODONE (ROXICODONE) 5 MG immediate release tablet, Take 1 tablet (5 mg total) by mouth every six (6) hours as needed for pain. for up to 5 days, Disp: 10 tablet, Rfl: 0  ???  prochlorperazine (COMPAZINE) 10 MG tablet, Take 10 mg by mouth every six (6) hours as needed for nausea., Disp: , Rfl:   ???  sertraline (ZOLOFT) 100 MG tablet, Take 150 mg by mouth daily. , Disp: , Rfl:   ???  tamsulosin (FLOMAX) 0.4 mg capsule, Take 1 capsule (0.4 mg total) by mouth daily., Disp: 10 capsule, Rfl: 0  ???  tranexamic acid (LYSTEDA) 650 mg Tab tablet, Take 2 tabs daily as needed for bleeding, Disp: 60 tablet, Rfl: 2  ???  umeclidinium-vilanterol (ANORO ELLIPTA) 62.5-25 mcg/actuation inhaler, Inhale 1 puff daily., Disp: , Rfl:     Facility-Administered Medications Ordered in Other Encounters:   ???  sodium chloride (NS) 0.9 % flush 10 mL, 10 mL, Intravenous, Q30 Min PRN, Melissa Lynford Citizen, NP    Allergies  Macrobid [nitrofurantoin monohyd/m-cryst]    Family History Problem Relation Age of Onset   ??? Heart failure Mother    ???  Diabetes Mother    ??? Coronary artery disease Mother    ??? Cirrhosis Father        Social History  Social History     Tobacco Use   ??? Smoking status: Former Smoker     Types: Cigarettes     Last attempt to quit: 09/09/1988     Years since quitting: 29.2   ??? Smokeless tobacco: Former Neurosurgeon     Quit date: 06/05/1991   Substance Use Topics   ??? Alcohol use: No   ??? Drug use: No       Review of Systems     Constitutional: Negative for fever, has had nausea.  Eyes: Negative for visual changes.  ENT: Negative for sore throat.  Cardiovascular: Negative for chest pain.  Respiratory: Negative for shortness of breath.  Gastrointestinal: Positive for abdominal pain, vomiting.  Genitourinary: Negative for dysuria.   Musculoskeletal: Positive for back pain.  Skin: Negative for rash.  Neurological: Negative for headaches, focal weakness or numbness.  All other systems reviewed and otherwise negative.         Physical Exam     ED Triage Vitals [12/15/17 1701]   Vital Signs Group      Temp 36.6 ??C (97.9 ??F)      Temp Source Skin      Heart Rate 75      SpO2 Pulse       Heart Rate Source Monitor      Resp 16      BP 143/64      BP Location Right arm      BP Method Automatic      Patient Position Sitting   SpO2 96 %       Constitutional: Alert and oriented. Obese.  No acute distress.    Eyes: Conjunctivae are normal.  ENT       Head: Normocephalic and atraumatic.       Nose: No congestion.       Mouth/Throat: Mucous membranes are moist.       Neck: No stridor.  Hematological/Lymphatic/Immunilogical: No cervical lymphadenopathy.  Cardiovascular: Normal rate, regular rhythm. Normal and symmetric distal pulses are present in all extremities.  Respiratory: Normal respiratory effort. Breath sounds are normal.  Gastrointestinal: Soft and tender to palpation over LLQ, no rebound or guarding. There is CVA tenderness L>R.   Musculoskeletal: Normal range of motion in all extremities. No vertebral tenderness to palpation.       Right lower leg: No tenderness or edema.       Left lower leg: No tenderness or edema.  Neurologic: Normal speech and language. No gross focal neurologic deficits are appreciated.  Skin: Skin is warm, dry and intact. No rash noted.  Psychiatric: Mood and affect are normal. Speech and behavior are normal.         Radiology     CT Abdomen/Pelvis:  Impression     --Tiny mid left ureteral renal calculus measuring approximately 0.3 cm with associated mild left hydronephrosis and perinephric and periureteral stranding.  --Additional bilateral nonobstructing calculi.  --Splenomegaly.             I evaluated this patient with Nena Jordan, MSIV. I attest that I have reviewed the student note and that the components of the history of the present illness, the physical exam, and the assessment and plan documented were performed by me or were performed in my presence by the student where I verified the documentation and performed (or re-performed) the  exam and medical decision making.            Merrie Roof, MD  12/15/17 [phone]

## 2017-12-16 NOTE — Unmapped (Signed)
Patient transported to CT Scan  Transported by Radiology  How tranported Stretcher  Cardiac Monitor yes

## 2017-12-16 NOTE — Unmapped (Signed)
Patient rounds completed. The following patient needs were addressed:  Pain, Toileting, Positioning;   SUPINE and Personal Belongings .

## 2017-12-17 NOTE — Unmapped (Signed)
[facility] Refill Coordination Note    Medication(s) to be Shipped:   IDHIFA 100mg   Other medications to be shipped: n/a     Jessica Wilson, DOB: 03-Oct-1940  Phone: [phone] (home) [phone] (work)  Shipping Address: [address]  [place]  [address]    All above HIPAA information was verified with patient.     Completed refill call assessment today to schedule patient's medication shipment from the [facility] [phone]).       Specialty medication(s) and dose(s) confirmed: Regimen is correct and unchanged.   Changes to medications: [name] reports no changes reported at this time.  Changes to insurance: No  Questions for the pharmacist: No    The patient will receive an FSI print out for each medication shipped and additional FDA Medication Guides as required.  Patient education from [place] or Robet Leu may also be included in the shipment.    DISEASE-SPECIFIC INFORMATION        N/A    ADHERENCE              Is this medicine covered by Medicare Part B? No.        SHIPPING     Shipping address confirmed in FSI.     Delivery Scheduled: Yes, Expected medication delivery date: 12/26/17 via UPS or courier.     [employer]   [facility] Specialty Pharmacist

## 2017-12-18 ENCOUNTER — Inpatient Hospital Stay: Payer: Medicare Other

## 2017-12-18 ENCOUNTER — Other Ambulatory Visit: Payer: Self-pay

## 2017-12-18 DIAGNOSIS — C9200 Acute myeloblastic leukemia, not having achieved remission: Secondary | ICD-10-CM | POA: Diagnosis not present

## 2017-12-18 DIAGNOSIS — N183 Chronic kidney disease, stage 3 (moderate): Secondary | ICD-10-CM | POA: Diagnosis not present

## 2017-12-18 DIAGNOSIS — M545 Low back pain: Secondary | ICD-10-CM | POA: Diagnosis not present

## 2017-12-18 DIAGNOSIS — I700 Atherosclerosis of aorta: Secondary | ICD-10-CM | POA: Diagnosis not present

## 2017-12-18 LAB — CBC WITH DIFFERENTIAL/PLATELET
BASOS ABS: 0.1 10*3/uL (ref 0–0.1)
Basophils Relative: 3 %
EOS PCT: 0 %
Eosinophils Absolute: 0 10*3/uL (ref 0–0.7)
HCT: 27.7 % — ABNORMAL LOW (ref 35.0–47.0)
Hemoglobin: 9.5 g/dL — ABNORMAL LOW (ref 12.0–16.0)
LYMPHS ABS: 0.8 10*3/uL — AB (ref 1.0–3.6)
Lymphocytes Relative: 39 %
MCH: 34 pg (ref 26.0–34.0)
MCHC: 34.3 g/dL (ref 32.0–36.0)
MCV: 99 fL (ref 80.0–100.0)
MONO ABS: 0.2 10*3/uL (ref 0.2–0.9)
Monocytes Relative: 9 %
Neutro Abs: 1 10*3/uL — ABNORMAL LOW (ref 1.4–6.5)
Neutrophils Relative %: 49 %
PLATELETS: 33 10*3/uL — AB (ref 150–400)
RBC: 2.8 MIL/uL — AB (ref 3.80–5.20)
RDW: 16.6 % — AB (ref 11.5–14.5)
WBC: 2.1 10*3/uL — AB (ref 3.6–11.0)

## 2017-12-18 LAB — BASIC METABOLIC PANEL
Anion gap: 11 (ref 5–15)
BUN: 26 mg/dL — AB (ref 6–20)
CALCIUM: 8.9 mg/dL (ref 8.9–10.3)
CO2: 23 mmol/L (ref 22–32)
CREATININE: 1.33 mg/dL — AB (ref 0.44–1.00)
Chloride: 108 mmol/L (ref 101–111)
GFR calc Af Amer: 44 mL/min — ABNORMAL LOW (ref >60)
GFR calc non Af Amer: 38 mL/min — ABNORMAL LOW (ref >60)
Glucose, Bld: 120 mg/dL — ABNORMAL HIGH (ref 65–99)
Potassium: 4.3 mmol/L (ref 3.5–5.1)
SODIUM: 142 mmol/L (ref 135–145)

## 2017-12-18 LAB — SAMPLE TO BLOOD BANK

## 2017-12-19 NOTE — Unmapped (Signed)
Phoned patient to check in and see how she is feeling since dx of kidney stone. Left vm with phone number if she would like to call us back with update.

## 2017-12-19 NOTE — Unmapped (Signed)
Patient returned call, reports that she is still in pain with kidney stone, however it is not severe like it was when she was in clinic. Pain in lower back, bilateral, is relieved with lying still, which is not very practical for her, however her husband is home from work to help her. Patient is pushing fluids, denies trouble urinating, denies visible blood in urine. She did phone her urologist this morning for an appt, however as she has not been seen in 2 years, she is considered a new pt despite recent diagnosis/pain, and next available appt is 4/24. Also discussed visit here 4/30 for kidney stones, advised we will keep appt for now as needed. Instructed patient to go to local ED if pain becomes severe/intolerable as it was earlier this week. Patient agreeable to plan. Denies chills, fevers.   Lastly, pt reports she had labs in [place] yesterday, and that she was told her kidney function has dropped a bit. Reviewed labs over phone, trends in creatinine and BUN, which have been as high or sometimes higher in past, however NN will send message to NP/MD re conversation/labs. Also discussed white count and as she is on the border of being neutropenic, it is very important she call us ASAP (ensured she had after hours number), should she develop a fever. Patient will monitor, verbalized understanding.

## 2017-12-22 ENCOUNTER — Other Ambulatory Visit: Payer: Self-pay

## 2017-12-22 ENCOUNTER — Inpatient Hospital Stay: Payer: Medicare Other

## 2017-12-22 DIAGNOSIS — I700 Atherosclerosis of aorta: Secondary | ICD-10-CM | POA: Diagnosis not present

## 2017-12-22 DIAGNOSIS — N183 Chronic kidney disease, stage 3 (moderate): Secondary | ICD-10-CM | POA: Diagnosis not present

## 2017-12-22 DIAGNOSIS — C9200 Acute myeloblastic leukemia, not having achieved remission: Secondary | ICD-10-CM | POA: Diagnosis not present

## 2017-12-22 DIAGNOSIS — M545 Low back pain: Secondary | ICD-10-CM | POA: Diagnosis not present

## 2017-12-22 LAB — CBC WITH DIFFERENTIAL/PLATELET
BASOS ABS: 0.2 10*3/uL — AB (ref 0–0.1)
Basophils Relative: 7 %
EOS PCT: 0 %
Eosinophils Absolute: 0 10*3/uL (ref 0–0.7)
HCT: 29.1 % — ABNORMAL LOW (ref 35.0–47.0)
HEMOGLOBIN: 10 g/dL — AB (ref 12.0–16.0)
LYMPHS PCT: 38 %
Lymphs Abs: 0.9 10*3/uL — ABNORMAL LOW (ref 1.0–3.6)
MCH: 33.9 pg (ref 26.0–34.0)
MCHC: 34.4 g/dL (ref 32.0–36.0)
MCV: 98.7 fL (ref 80.0–100.0)
Monocytes Absolute: 0.2 10*3/uL (ref 0.2–0.9)
Monocytes Relative: 8 %
NEUTROS ABS: 1.1 10*3/uL — AB (ref 1.4–6.5)
NEUTROS PCT: 47 %
PLATELETS: 47 10*3/uL — AB (ref 150–400)
RBC: 2.95 MIL/uL — AB (ref 3.80–5.20)
RDW: 16.4 % — ABNORMAL HIGH (ref 11.5–14.5)
WBC: 2.3 10*3/uL — AB (ref 3.6–11.0)

## 2017-12-22 LAB — BASIC METABOLIC PANEL
ANION GAP: 8 (ref 5–15)
BUN: 26 mg/dL — ABNORMAL HIGH (ref 6–20)
CO2: 24 mmol/L (ref 22–32)
Calcium: 9 mg/dL (ref 8.9–10.3)
Chloride: 107 mmol/L (ref 101–111)
Creatinine, Ser: 1.17 mg/dL — ABNORMAL HIGH (ref 0.44–1.00)
GFR, EST AFRICAN AMERICAN: 51 mL/min — AB (ref >60)
GFR, EST NON AFRICAN AMERICAN: 44 mL/min — AB (ref >60)
Glucose, Bld: 135 mg/dL — ABNORMAL HIGH (ref 65–99)
POTASSIUM: 4.5 mmol/L (ref 3.5–5.1)
SODIUM: 139 mmol/L (ref 135–145)

## 2017-12-22 LAB — SAMPLE TO BLOOD BANK

## 2017-12-24 ENCOUNTER — Ambulatory Visit (INDEPENDENT_AMBULATORY_CARE_PROVIDER_SITE_OTHER): Payer: Medicare Other | Admitting: Family Medicine

## 2017-12-24 ENCOUNTER — Encounter: Payer: Self-pay | Admitting: Family Medicine

## 2017-12-24 VITALS — BP 128/62 | HR 76 | Temp 98.6°F | Resp 14 | Ht 63.0 in | Wt 223.3 lb

## 2017-12-24 DIAGNOSIS — N200 Calculus of kidney: Secondary | ICD-10-CM | POA: Diagnosis not present

## 2017-12-24 DIAGNOSIS — E785 Hyperlipidemia, unspecified: Secondary | ICD-10-CM

## 2017-12-24 DIAGNOSIS — J449 Chronic obstructive pulmonary disease, unspecified: Secondary | ICD-10-CM

## 2017-12-24 DIAGNOSIS — I480 Paroxysmal atrial fibrillation: Secondary | ICD-10-CM | POA: Diagnosis not present

## 2017-12-24 DIAGNOSIS — R8290 Unspecified abnormal findings in urine: Secondary | ICD-10-CM | POA: Diagnosis not present

## 2017-12-24 DIAGNOSIS — E1121 Type 2 diabetes mellitus with diabetic nephropathy: Secondary | ICD-10-CM

## 2017-12-24 DIAGNOSIS — Z5181 Encounter for therapeutic drug level monitoring: Secondary | ICD-10-CM

## 2017-12-24 DIAGNOSIS — D46Z Other myelodysplastic syndromes: Secondary | ICD-10-CM

## 2017-12-24 DIAGNOSIS — D696 Thrombocytopenia, unspecified: Secondary | ICD-10-CM

## 2017-12-24 LAB — POCT URINALYSIS DIPSTICK
APPEARANCE: NORMAL
Bilirubin, UA: NEGATIVE
GLUCOSE UA: NEGATIVE
Ketones, UA: NEGATIVE
LEUKOCYTES UA: NEGATIVE
NITRITE UA: NEGATIVE
SPEC GRAV UA: 1.015 (ref 1.010–1.025)
Urobilinogen, UA: 0.2 E.U./dL
pH, UA: 5 (ref 5.0–8.0)

## 2017-12-24 NOTE — Assessment & Plan Note (Signed)
Sounds to be in NSR today 

## 2017-12-24 NOTE — Assessment & Plan Note (Signed)
Check lipids today; avoiding fatty meats

## 2017-12-24 NOTE — Assessment & Plan Note (Signed)
Going to see urologist at [facility]; hydrate, hydrate, hydrate, low salt diet, avoid dark colas, tea

## 2017-12-24 NOTE — Assessment & Plan Note (Signed)
Coming up with treatment

## 2017-12-24 NOTE — Assessment & Plan Note (Signed)
She has lost a few pounds; offered help, doing it on her own

## 2017-12-24 NOTE — Patient Instructions (Addendum)
Check out the information at familydoctor.org entitled "Nutrition for Weight Loss: What You Need to Know about Fad Diets" Try to lose between 1-2 pounds per week by taking in fewer calories and burning off more calories You can succeed by limiting portions, limiting foods dense in calories and fat, becoming more active, and drinking 8 glasses of water a day (64 ounces) Don't skip meals, especially breakfast, as skipping meals may alter your metabolism Do not use over-the-counter weight loss pills or gimmicks that claim rapid weight loss A healthy BMI (or body mass index) is between 18.5 and 24.9 You can calculate your ideal BMI at the [place] website ClubMonetize.fr Try sprinkling large flake nutritional yeast for a cheesy flavor on potatoes and popcorn Let's get labs today If you have not heard anything from my staff in a week about any orders/referrals/studies from today, please contact us here to follow-up [phone] If you need something for aches or pains, try to use Tylenol (acetaminophen) instead of non-steroidals (which include Aleve, ibuprofen, Advil, Motrin, and naproxen); non-steroidals can cause long-term kidney damage

## 2017-12-24 NOTE — Assessment & Plan Note (Signed)
Under treatment. 

## 2017-12-24 NOTE — Assessment & Plan Note (Signed)
Continue inhalers if needed; controlled

## 2017-12-24 NOTE — Progress Notes (Signed)
BP 128/62   Pulse 76   Temp 98.6 F (37 C) (Oral)   Resp 14   Ht 5\' 3"  (1.6 m)   Wt 223 lb 4.8 oz (101.3 kg)   SpO2 94%   BMI 39.56 kg/m    Subjective:    Patient ID: Teresa Carrillo, female    DOB: 06/18/1941, 77 y.o.   MRN: [id number]  HPI: Teresa Carrillo is a 77 y.o. female  Chief Complaint  Patient presents with  . Follow-up    HPI Patient is here for f/u She has acute myeloid leukemia, seen at the cancer center; numerous notes in the chart She recently had a kidney stone, and was seen in the ER at [facility] on 12/15/17; she discussed with nurse navigator at [facility] pushing fluids, no blood in urine; she called her urologist, will see him/her on 12/31/17 and will see [facility] on 01/06/18 Renal function had dropped a little; seeing nephrologist; Cr 1.17, bounced around, reviewed [facility] and [place] on the left passed, the right side is still bothering her some Taking clonazepam (not prescribed here); just taking tylenol for pain; knows she cannot take opiates with benzo Type 2 diabetes; watching diet, not many sweets; no foot exam; last eye exam UTD Lab Results  Component Value Date   HGBA1C 4.9 12/24/2017  hx of atrial fib; no further episodes; feels regular COPD; hanging in there; no new problems; no SHOB Morbid obesity; she weighed 258 pounds at one time, is coming down High cholesterol; not many fatty meats  Depression screen [facility] 2/9 12/24/2017 09/24/2017 12/25/2016  Decreased Interest 0 0 0  Down, Depressed, Hopeless 0 0 0  PHQ - 2 Score 0 0 0  Some recent data might be hidden   Relevant past medical, surgical, family and social history reviewed Past Medical History:  Diagnosis Date  . Abdominal wall mass   . Anginal pain ([place])   . Anxiety   . Arthritis   . Calculus of kidney   . Cystitis   . Depression   . Diabetes mellitus without complication (HCC)    elevated A1c  . Dyspnea on exertion   . Elevated serum creatinine   . Fibrocystic breast disease   .  GERD (gastroesophageal reflux disease)   . Hearing loss   . Heart murmur   . HTN (hypertension)   . Hyperlipidemia   . Kidney stones   . MDS (myelodysplastic syndrome), high grade ([place]) 04/23/2016  . Microscopic hematuria   . Mouth sores   . Mucositis due to chemotherapy   . Obesity   . Obesity, morbid ([place]) 09/24/2017   BMI > 35 with type 2 diabetes  . Pneumonia   . Risk for falls   . Sleep apnea   . Thrombocytopenia ([place])   . Urinary frequency   . Urinary urgency    Past Surgical History:  Procedure Laterality Date  . ABDOMINAL HYSTERECTOMY    . APPENDECTOMY    . CARDIAC CATHETERIZATION     x2  . CHOLECYSTECTOMY    . COLONOSCOPY N/A 02/24/2015   Procedure: COLONOSCOPY;  Surgeon: Manya Silvas, MD;  Location: [facility] ENDOSCOPY;  Service: Endoscopy;  Laterality: N/A;  . DIAGNOSTIC LAPAROSCOPY     Removal of benign abdominal tumor  . ESOPHAGOGASTRODUODENOSCOPY N/A 02/24/2015   Procedure: ESOPHAGOGASTRODUODENOSCOPY (EGD);  Surgeon: Manya Silvas, MD;  Location: [facility] ENDOSCOPY;  Service: Endoscopy;  Laterality: N/A;  . IR FLUORO GUIDE PORT INSERTION LEFT  10/09/2017  .  right eye surgery Right    Family History  Problem Relation Age of Onset  . Congestive Heart Failure Mother   . Diabetes Mother   . Coronary artery disease Mother   . Stroke Mother   . Cirrhosis Father   . Diabetes Brother    Social History   Tobacco Use  . Smoking status: Former Smoker    Types: Cigarettes    Last attempt to quit: 09/09/1988    Years since quitting: 29.3  . Smokeless tobacco: Never Used  . Tobacco comment: quit 25 years ago  Substance Use Topics  . Alcohol use: No    Alcohol/week: 0.0 oz  . Drug use: No    Interim medical history since last visit reviewed. Allergies and medications reviewed  Review of Systems Per HPI unless specifically indicated above     Objective:    BP 128/62   Pulse 76   Temp 98.6 F (37 C) (Oral)   Resp 14   Ht 5\' 3"  (1.6 m)   Wt 223 lb 4.8 oz  (101.3 kg)   SpO2 94%   BMI 39.56 kg/m   Wt Readings from Last 3 Encounters:  12/25/17 223 lb (101.2 kg)  12/24/17 223 lb 4.8 oz (101.3 kg)  12/04/17 225 lb (102.1 kg)    Physical Exam  Constitutional: She appears well-developed and well-nourished. No distress.  HENT:  Head: Normocephalic and atraumatic.  Eyes: EOM are normal. No scleral icterus.  Neck: No thyromegaly present.  Cardiovascular: Normal rate, regular rhythm and normal heart sounds.  No murmur heard. Pulmonary/Chest: Effort normal and breath sounds normal. No respiratory distress. She has no wheezes.  Abdominal: Soft. Bowel sounds are normal. She exhibits no distension.  Musculoskeletal: She exhibits no edema.  Neurological: She is alert. She exhibits normal muscle tone.  Skin: Skin is warm and dry. She is not diaphoretic. No pallor.  Psychiatric: She has a normal mood and affect. Her behavior is normal. Judgment and thought content normal.   Diabetic Foot Form - Detailed   Diabetic Foot Exam - detailed Diabetic Foot exam was performed with the following findings:  Yes 12/24/2017 10:18 AM  Visual Foot Exam completed.:  Yes  Pulse Foot Exam completed.:  Yes  Right Dorsalis Pedis:  Present Left Dorsalis Pedis:  Present  Sensory Foot Exam Completed.:  Yes Semmes-Weinstein Monofilament Test R Site 1-Great Toe:  Pos L Site 1-Great Toe:  Pos           Assessment & Plan:   Problem List Items Addressed This Visit      Cardiovascular and Mediastinum   Paroxysmal atrial fibrillation (HCC)    Sounds to be in NSR today        Respiratory   COPD, mild (HCC) (Chronic)    Continue inhalers if needed; controlled        Endocrine   Type 2 diabetes mellitus with nephropathy (HCC) (Chronic)    Will check A1c; foot exam by MD      Relevant Orders   Microalbumin / creatinine urine ratio (Completed)   Lipid panel (Completed)   Hemoglobin A1c (Completed)     Genitourinary   Renal stone    Going to see urologist  at [facility]; hydrate, hydrate, hydrate, low salt diet, avoid dark colas, tea        Other   Thrombocytopenia ([place])    Coming up with treatment      Obesity, morbid (HCC) (Chronic)    She has lost a few  pounds; offered help, doing it on her own      Medication monitoring encounter   MDS (myelodysplastic syndrome), high grade (HCC)    Under treatment      Dyslipidemia (Chronic)    Check lipids today; avoiding fatty meats      Relevant Orders   Lipid panel (Completed)    Other Visit Diagnoses    Abnormal urine    -  Primary   Relevant Orders   POCT urinalysis dipstick (Completed)       Follow up plan: Return in about 3 months (around 03/25/2018) for follow-up visit with Dr. Sanda Klein.  An after-visit summary was printed and given to the patient at [place].  Please see the patient instructions which may contain other information and recommendations beyond what is mentioned above in the assessment and plan.  No orders of the defined types were placed in this encounter.   Orders Placed This Encounter  Procedures  . Microalbumin / creatinine urine ratio  . Lipid panel  . Hemoglobin A1c  . POCT urinalysis dipstick

## 2017-12-24 NOTE — Assessment & Plan Note (Signed)
Will check A1c; foot exam by MD

## 2017-12-25 ENCOUNTER — Inpatient Hospital Stay (HOSPITAL_BASED_OUTPATIENT_CLINIC_OR_DEPARTMENT_OTHER): Payer: Medicare Other | Admitting: Internal Medicine

## 2017-12-25 ENCOUNTER — Other Ambulatory Visit: Payer: Self-pay

## 2017-12-25 ENCOUNTER — Inpatient Hospital Stay: Payer: Medicare Other

## 2017-12-25 VITALS — BP 160/75 | HR 66 | Temp 97.6°F | Resp 18 | Ht 63.0 in | Wt 223.0 lb

## 2017-12-25 DIAGNOSIS — Z95828 Presence of other vascular implants and grafts: Secondary | ICD-10-CM

## 2017-12-25 DIAGNOSIS — C9200 Acute myeloblastic leukemia, not having achieved remission: Secondary | ICD-10-CM | POA: Diagnosis not present

## 2017-12-25 DIAGNOSIS — N183 Chronic kidney disease, stage 3 (moderate): Secondary | ICD-10-CM

## 2017-12-25 DIAGNOSIS — M545 Low back pain: Secondary | ICD-10-CM | POA: Diagnosis not present

## 2017-12-25 DIAGNOSIS — I700 Atherosclerosis of aorta: Secondary | ICD-10-CM | POA: Diagnosis not present

## 2017-12-25 LAB — LIPID PANEL
Cholesterol: 185 mg/dL (ref <200)
HDL: 32 mg/dL — AB (ref >50)
LDL Cholesterol (Calc): 112 mg/dL (calc) — ABNORMAL HIGH
Non-HDL Cholesterol (Calc): 153 mg/dL (calc) — ABNORMAL HIGH (ref <130)
TRIGLYCERIDES: 286 mg/dL — AB (ref <150)
Total CHOL/HDL Ratio: 5.8 (calc) — ABNORMAL HIGH (ref <5.0)

## 2017-12-25 LAB — CBC WITH DIFFERENTIAL/PLATELET
Basophils Absolute: 0.2 10*3/uL — ABNORMAL HIGH (ref 0–0.1)
Basophils Relative: 5 %
EOS PCT: 0 %
Eosinophils Absolute: 0 10*3/uL (ref 0–0.7)
HEMATOCRIT: 29.2 % — AB (ref 35.0–47.0)
Hemoglobin: 10 g/dL — ABNORMAL LOW (ref 12.0–16.0)
LYMPHS PCT: 39 %
Lymphs Abs: 1.2 10*3/uL (ref 1.0–3.6)
MCH: 33.4 pg (ref 26.0–34.0)
MCHC: 34.3 g/dL (ref 32.0–36.0)
MCV: 97.4 fL (ref 80.0–100.0)
MONO ABS: 0.3 10*3/uL (ref 0.2–0.9)
MONOS PCT: 9 %
NEUTROS ABS: 1.5 10*3/uL (ref 1.4–6.5)
Neutrophils Relative %: 47 %
PLATELETS: 47 10*3/uL — AB (ref 150–400)
RBC: 3 MIL/uL — ABNORMAL LOW (ref 3.80–5.20)
RDW: 16.4 % — AB (ref 11.5–14.5)
WBC: 3.2 10*3/uL — ABNORMAL LOW (ref 3.6–11.0)

## 2017-12-25 LAB — BASIC METABOLIC PANEL
ANION GAP: 9 (ref 5–15)
BUN: 28 mg/dL — AB (ref 6–20)
CALCIUM: 8.9 mg/dL (ref 8.9–10.3)
CO2: 24 mmol/L (ref 22–32)
Chloride: 106 mmol/L (ref 101–111)
Creatinine, Ser: 1.16 mg/dL — ABNORMAL HIGH (ref 0.44–1.00)
GFR calc Af Amer: 52 mL/min — ABNORMAL LOW (ref >60)
GFR, EST NON AFRICAN AMERICAN: 45 mL/min — AB (ref >60)
GLUCOSE: 109 mg/dL — AB (ref 65–99)
POTASSIUM: 4 mmol/L (ref 3.5–5.1)
Sodium: 139 mmol/L (ref 135–145)

## 2017-12-25 LAB — MICROALBUMIN / CREATININE URINE RATIO
Creatinine, Urine: 62 mg/dL (ref 20–275)
Microalb Creat Ratio: 152 mcg/mg creat — ABNORMAL HIGH (ref <30)
Microalb, Ur: 9.4 mg/dL

## 2017-12-25 LAB — SAMPLE TO BLOOD BANK

## 2017-12-25 LAB — HEMOGLOBIN A1C
HEMOGLOBIN A1C: 4.9 %{Hb} (ref <5.7)
MEAN PLASMA GLUCOSE: 94 (calc)
eAG (mmol/L): 5.2 (calc)

## 2017-12-25 MED ORDER — SODIUM CHLORIDE 0.9% FLUSH
10.0000 mL | INTRAVENOUS | Status: AC | PRN
  Administered 2017-12-25: 10 mL
  Filled 2017-12-25: qty 10

## 2017-12-25 MED ORDER — HEPARIN SOD (PORK) LOCK FLUSH 100 UNIT/ML IV SOLN
500.0000 [IU] | INTRAVENOUS | Status: AC | PRN
  Administered 2017-12-25: 500 [IU]

## 2017-12-25 MED FILL — IDHIFA/100 MG/TAB: IDHIFA/100 MG/TAB | 30 days supply | Qty: 30 | Fill #6

## 2017-12-25 NOTE — Progress Notes (Signed)
[facility] NOTE  Patient Care Team: Lada, Satira Anis, [name] Cammie Sickle, MD as Consulting Physician (Internal Medicine) Gloriann Loan, MD as Attending Physician Erby Pian, MD as Referring Physician (Specialist) Yolonda Kida, MD as Consulting Physician (Cardiology) Albertine Patricia, [place] as Attending Physician (Podiatry) Lavonia Dana, MD as Consulting Physician (Nephrology)  CHIEF COMPLAINTS/PURPOSE OF CONSULTATION:   Oncology History   #JUNE 2017- Severe neutropenia/ Anemia ~hb 9/platlets- 85-100 AUG 2017- REFRACTORY ANEMIA with EXCESS BLASTS [14% blasts- BMBx]; cytogenetics/FISH-N [SNP micorarray- not done]   # AUG 21st 2017-  START AZA 32m/m2 day- 1-7 q 28 days x4 cycles; DEC 6th- BMBx- <5% blasts; hypercellular with dysplasia.   # SEP 20th 2018- ACUTE MYELOID LEUKEMIA [38% blasts on BMBx]; IDH-MUTATION PRESENT; low FLT-3; OCT 18th 2018- ENASIDENIB [Dr.Foster; [facility]]   # CKD stage III  ------------------------------------------------------   MOLECULAR TESTING: NGS sent 06/2017 -FLT3-ITD <0.05 mutational burden -IDH2 -DNMT3A --treatment options include FLT3 inhibition and IDH2 inhibition; given low level FYOM6-YOKallelic ratio [admittedly in peripheral blood, not bone marrow]- started on ENASIDENIB     MDS (myelodysplastic syndrome), high grade ([place]   04/23/2016 Initial Diagnosis    MDS (myelodysplastic syndrome), high grade (HCC)       Acute myeloid leukemia not having achieved remission ([place]    HISTORY OF PRESENTING ILLNESS:  Teresa 89.  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   In the interim patient was [facility] CT [facility].  Pain improved.  Awaiting evaluation with urology.    Patient admits to good appetite.  She denies any nausea vomiting diarrhea.  Denies any fevers.  Denies any gum bleeding or nosebleeds.  Her last blood transfusion was approximately 3 months ago; platelet transfusion was approximately 2 month ago.  She in fact drove herself to the appointment today.   ROS: A complete 10 point review of system is done which is negative except mentioned above in history of present illness.  MEDICAL HISTORY:  Past Medical History:  Diagnosis Date  . Abdominal wall mass   . Anginal pain ([place]   . Anxiety   . Arthritis   . Calculus of kidney   . Cystitis   . Depression   . Diabetes mellitus without complication (HCC)    elevated A1c  . Dyspnea on exertion   . Elevated serum creatinine   . Fibrocystic breast disease   . GERD (gastroesophageal reflux disease)   . Hearing loss   . Heart murmur   . HTN (hypertension)   . Hyperlipidemia   . Kidney stones   . MDS (myelodysplastic syndrome), high grade ([place] 04/23/2016  . Microscopic hematuria   . Mouth sores   . Mucositis due to chemotherapy   . Obesity   . Obesity, morbid ([place] 09/24/2017   BMI > 35 with type 2 diabetes  . Pneumonia   . Risk for falls   . Sleep apnea   . Thrombocytopenia ([place]   . Urinary frequency   . Urinary urgency     SURGICAL HISTORY: Past Surgical History:  Procedure Laterality Date  . ABDOMINAL HYSTERECTOMY    . APPENDECTOMY    . CARDIAC CATHETERIZATION     x2  . CHOLECYSTECTOMY    . COLONOSCOPY N/A 02/24/2015  Procedure: COLONOSCOPY;  Surgeon: Manya Silvas, MD;  [facility] END[facility]COPY;  Service: Endoscopy;  Laterality: N/A;  . DIAGNOSTIC LAPAROSCOPY     Removal of benign abdominal tumor  . ESOPHAGOGASTRODUODENOSCOPY N/A 02/24/2015   Procedure: ESOPHAGOGASTRODUODENOSCOPY (EGD);  Surgeon: Manya Silvas, MD;  Location: [facility] ENDOSCOPY;  Service: Endoscopy;  Laterality: N/A;  . IR FLUORO GUIDE PORT INSERTION LEFT  10/09/2017  . right eye surgery Right      SOCIAL HISTORY: lives with family; [employer]; [employer] in [place] ret[place]. No smoking/ no alcohol.  Social History   Socioeconomic History  . Marital status: Married    Spouse name: Not on file  . Number of children: Not on file  . Years of education: Not on file  . Highest education level: Not on file  Occupational History  . Not on file  Social Needs  . Financial resource strain: Not on file  . Food insecurity:    Worry: Not on file    Inability: Not on file  . Transportation needs:    Medical: Not on file    Non-medical: Not on file  Tobacco Use  . Smoking status: Former Smoker    Types: Cigarettes    Last attempt to quit: 09/09/1988    Years since quitting: 29.3  . Smokeless tobacco: Never Used  . Tobacco comment: quit 25 years ago  Substance and Sexual Activity  . Alcohol use: No    Alcohol/week: 0.0 oz  . Drug use: No  . Sexual activity: Not Currently    Birth control/protection: Post-menopausal  Lifestyle  . Physical activity:    Days per week: Not on file    Minutes per session: Not on file  . Stress: Not on file  Relationships  . Social connections:    Talks on phone: Not on file    Gets together: Not on file    Attends religious service: Not on file    Active member of club or organization: Not on file    Attends meetings of clubs or organizations: Not on file    Relationship status: Not on file  . Intimate partner violence:    Fear of current or ex partner: Not on file    Emotionally abused: Not on file    Physically abused: Not on file    Forced sexual activity: Not on file  Other Topics Concern  . Not on file  Social History Narrative  . Not on file    FAMILY HISTORY: no cancers in family.  Family History  Problem Relation Age of Onset  . Congestive Heart Failure Mother   . Diabetes Mother   . Coronary artery disease Mother   . Stroke Mother   . Cirrhosis Father   . Diabetes Brother     ALLERGIES:  is allergic to macrobid [nitrofurantoin  monohyd macro].  MEDICATIONS:  Current Outpatient Medications  Medication Sig Dispense Refill  . acyclovir (ZOVIRAX) 400 MG tablet Take 400 mg by mouth 2 (two) times daily.    Marland Kitchen albuterol (PROVENTIL HFA;VENTOLIN HFA) 108 (90 Base) MCG/ACT inhaler Inhale 2 puffs into the lungs every 6 (six) hours as needed for wheezing or shortness of breath. 1 Inhaler 2  . buPROPion (WELLBUTRIN XL) 150 MG 24 hr tablet Take 300 mg by mouth daily at 12 noon.     . chlorhexidine (PERIDEX) 0.12 % solution 15 mLs by Mouth Rinse route 2 (two) times daily.  4  . clonazePAM (KLONOPIN) 0.5 MG tablet Take 0.5 mg by mouth  2 (two) times daily.     Marland Kitchen diltiazem (CARDIZEM CD) 180 MG 24 hr capsule Take 1 capsule (180 mg total) by mouth daily. 30 capsule 0  . IDHIFA 100 MG TABS Take 100 mg by mouth daily.    Marland Kitchen lidocaine-prilocaine (EMLA) cream Apply 1 application topically as needed. Apply small amount to port site at least 1 hour prior to it being accessed, cover with plastic wrap 30 g 1  . ondansetron (ZOFRAN-ODT) 4 MG disintegrating tablet Take 4 mg by mouth every 8 (eight) hours as needed for nausea or vomiting.    . sertraline (ZOLOFT) 100 MG tablet Take 100 mg by mouth daily at 12 noon.     . tranexamic acid (LYSTEDA) 650 MG TABS tablet Take 2 tablets by mouth as needed for bleeding. Taking for bleeding gums    . umeclidinium-vilanterol (ANORO ELLIPTA) 62.5-25 MCG/INH AEPB Inhale 1 puff into the lungs as needed.    . prochlorperazine (COMPAZINE) 10 MG tablet Take 1 tablet (10 mg total) by mouth every 6 (six) hours as needed (Nausea or vomiting). (Patient not taking: Reported on 12/25/2017) 30 tablet 1   No current facility-administered medications for this visit.       Marland Kitchen  PHYSICAL EXAMINATION: ECOG PERFORMANCE STATUS: 1 - Symptomatic but completely ambulatory  Vitals:   12/25/17 0943  BP: (!) 160/75  Pulse: 66  Resp: 18  Temp: 97.6 F (36.4 C)   Filed Weights   12/25/17 0943  Weight: 223 lb (101.2 kg)     GENERAL: Well-nourished well-developed; Alert, no distress and comfortable.   Obese.  She is alone.  She is walking by herself. EYES: Positive for pallor. OROPHARYNX: no thrush or ulceration;  NECK: supple, no masses felt LYMPH:  no palpable lymphadenopathy in the cervical, axillary or inguinal regions LUNGS: Decreased breath sounds to auscultation and  No wheeze or crackles HEART/CVS: regular rate & rhythm and no murmurs; No lower extremity edema ABDOMEN: abdomen soft, non-tender and normal bowel sounds; possible splenomegaly. Musculoskeletal:no cyanosis of digits and no clubbing  PSYCH: alert & oriented x 3 with fluent speech NEURO: no focal motor/sensory deficits SKIN:  No skin rash/nodules.  LABORATORY DATA:  I have reviewed the data as listed Lab Results  Component Value Date   WBC 3.2 (L) 12/25/2017   HGB 10.0 (L) 12/25/2017   HCT 29.2 (L) 12/25/2017   MCV 97.4 12/25/2017   PLT 47 (L) 12/25/2017   Recent Labs    07/17/17 1229  08/11/17 0948  08/25/17 0938  10/01/17 0811  12/18/17 1043 12/22/17 1100 12/25/17 0927  NA 137   < > 141   < > 140   < > 139   < > 142 139 139  K 3.6   < > 4.0   < > 4.3   < > 4.1   < > 4.3 4.5 4.0  CL 103   < > 104   < > 105   < > 106   < > 108 107 106  CO2 24   < > 24   < > 26   < > 27   < > 23 24 24   GLUCOSE 114*   < > 132*   < > 136*   < > 120*   < > 120* 135* 109*  BUN 16   < > 16   < > 17   < > 24*   < > 26* 26* 28*  CREATININE 1.37*   < >  1.23*   < > 1.28*   < > 1.24*   < > 1.33* 1.17* 1.16*  CALCIUM 9.2   < > 9.1   < > 9.0   < > 8.8*   < > 8.9 9.0 8.9  GFRNONAA 37*   < > 42*   < > 40*   < > 41*   < > 38* 44* 45*  GFRAA 43*   < > 48*   < > 46*   < > 48*   < > 44* 51* 52*  PROT 8.0   < > 7.4  --  6.9  --  6.7  --   --   --   --   ALBUMIN 3.6   < > 3.8  --  3.8  --  3.9  --   --   --   --   AST 29   < > 20  --  24  --  16  --   --   --   --   ALT 19   < > 15  --  27  --  13*  --   --   --   --   ALKPHOS 77   < > 68  --  62  --  63   --   --   --   --   BILITOT 1.2   < > 1.1  --  1.1  --  1.1  --   --   --   --   BILIDIR 0.1  --   --   --   --   --   --   --   --   --   --   IBILI 1.1*  --   --   --   --   --   --   --   --   --   --    < > = values in this interval not displayed.    RADIOGRAPHIC STUDIES: I have personally reviewed the radiological images as listed and agreed with the findings in the report. No results found. IMPRESSION: 1. Mid and lower lung zone peribronchovascular ground-glass, nodularity and consolidation, likely due to an infectious bronchiolitis/bronchopneumonia. 2. Upper abdominal adenopathy, as on 07/17/2017. 3. Aortic atherosclerosis (ICD10-170.0). Moderate coronary artery calcification.   Electronically Signed   By: Lorin Picket M.D.   On: 08/25/2017 11:57 ASSESSMENT & PLAN:   Acute myeloid leukemia not having achieved remission ([place]) AML - progressed from MDS- 12 cycles azacitadine; progressed to AML w/ 38% blasts- 05/2017. Repeat BMBx 11/12 at [facility] shows increased in blasts 35% to >60% per report. Currently on enasidenib [started 06/28/17].   # partial response- mostly improvement/stability of white count/hb; still needing ~ 1 platelets ~transfusion appx 2 months ago [feb 25th]!!  Patient's last blood transfusion was approximately 3 months [Jan 11th] ago.  Given the clinical benefit-continue Enasidenib. Reviewed with Dr.Foster.   # low back pain/ radiating bil- resolved; ? Kidney stone- 62m/ureteral stone ? [facility]   # AKI/CKD - stage III.stable.   # Follow up in 3 weeks; once a week CSalineno North MD 12/25/2017 10:06 AM

## 2017-12-25 NOTE — Assessment & Plan Note (Addendum)
AML - progressed from MDS- 12 cycles azacitadine; progressed to AML w/ 38% blasts- 05/2017. Repeat BMBx 11/12 at [facility] shows increased in blasts 35% to >60% per report. Currently on enasidenib [started 06/28/17].   # partial response- mostly improvement/stability of white count/hb; still needing ~ 1 platelets ~transfusion appx 2 months ago [feb 25th]!!  Patient's last blood transfusion was approximately 3 months [Jan 11th] ago.  Given the clinical benefit-continue Enasidenib. Reviewed with Dr.Foster.   # low back pain/ radiating bil- resolved; ? Kidney stone- 75mm/ureteral stone ? Passed. appt- with Urology [facility].   # AKI/CKD - stage III.stable.   # Follow up in 3 weeks; once a week CBC

## 2017-12-30 ENCOUNTER — Ambulatory Visit: Payer: Medicare Other | Admitting: Urology

## 2017-12-31 ENCOUNTER — Ambulatory Visit: Payer: Medicare Other | Admitting: Urology

## 2018-01-01 ENCOUNTER — Inpatient Hospital Stay: Payer: Medicare Other

## 2018-01-01 ENCOUNTER — Other Ambulatory Visit: Payer: Self-pay

## 2018-01-01 DIAGNOSIS — I700 Atherosclerosis of aorta: Secondary | ICD-10-CM | POA: Diagnosis not present

## 2018-01-01 DIAGNOSIS — C9200 Acute myeloblastic leukemia, not having achieved remission: Secondary | ICD-10-CM | POA: Diagnosis not present

## 2018-01-01 DIAGNOSIS — N183 Chronic kidney disease, stage 3 (moderate): Secondary | ICD-10-CM | POA: Diagnosis not present

## 2018-01-01 DIAGNOSIS — M545 Low back pain: Secondary | ICD-10-CM | POA: Diagnosis not present

## 2018-01-01 LAB — CBC WITH DIFFERENTIAL/PLATELET
BASOS PCT: 6 %
Basophils Absolute: 0.2 10*3/uL — ABNORMAL HIGH (ref 0–0.1)
EOS ABS: 0 10*3/uL (ref 0–0.7)
Eosinophils Relative: 0 %
HEMATOCRIT: 29.9 % — AB (ref 35.0–47.0)
Hemoglobin: 10.1 g/dL — ABNORMAL LOW (ref 12.0–16.0)
LYMPHS ABS: 1 10*3/uL (ref 1.0–3.6)
Lymphocytes Relative: 30 %
MCH: 33.5 pg (ref 26.0–34.0)
MCHC: 33.9 g/dL (ref 32.0–36.0)
MCV: 98.6 fL (ref 80.0–100.0)
MONOS PCT: 7 %
Monocytes Absolute: 0.2 10*3/uL (ref 0.2–0.9)
NEUTROS ABS: 1.9 10*3/uL (ref 1.4–6.5)
Neutrophils Relative %: 57 %
Platelets: 32 10*3/uL — ABNORMAL LOW (ref 150–400)
RBC: 3.03 MIL/uL — AB (ref 3.80–5.20)
RDW: 17.1 % — ABNORMAL HIGH (ref 11.5–14.5)
WBC: 3.3 10*3/uL — AB (ref 3.6–11.0)

## 2018-01-01 LAB — BASIC METABOLIC PANEL
ANION GAP: 9 (ref 5–15)
BUN: 26 mg/dL — AB (ref 6–20)
CHLORIDE: 105 mmol/L (ref 101–111)
CO2: 26 mmol/L (ref 22–32)
Calcium: 9.1 mg/dL (ref 8.9–10.3)
Creatinine, Ser: 1.18 mg/dL — ABNORMAL HIGH (ref 0.44–1.00)
GFR calc Af Amer: 51 mL/min — ABNORMAL LOW (ref >60)
GFR calc non Af Amer: 44 mL/min — ABNORMAL LOW (ref >60)
GLUCOSE: 124 mg/dL — AB (ref 65–99)
POTASSIUM: 4.7 mmol/L (ref 3.5–5.1)
Sodium: 140 mmol/L (ref 135–145)

## 2018-01-01 LAB — SAMPLE TO BLOOD BANK

## 2018-01-05 DIAGNOSIS — G4733 Obstructive sleep apnea (adult) (pediatric): Secondary | ICD-10-CM | POA: Diagnosis not present

## 2018-01-08 ENCOUNTER — Inpatient Hospital Stay: Payer: Medicare Other | Attending: Internal Medicine

## 2018-01-08 DIAGNOSIS — C9200 Acute myeloblastic leukemia, not having achieved remission: Secondary | ICD-10-CM | POA: Diagnosis not present

## 2018-01-08 DIAGNOSIS — E119 Type 2 diabetes mellitus without complications: Secondary | ICD-10-CM | POA: Diagnosis not present

## 2018-01-08 DIAGNOSIS — Z87891 Personal history of nicotine dependence: Secondary | ICD-10-CM | POA: Diagnosis not present

## 2018-01-08 DIAGNOSIS — R5383 Other fatigue: Secondary | ICD-10-CM | POA: Insufficient documentation

## 2018-01-08 DIAGNOSIS — N183 Chronic kidney disease, stage 3 (moderate): Secondary | ICD-10-CM | POA: Diagnosis not present

## 2018-01-08 DIAGNOSIS — I10 Essential (primary) hypertension: Secondary | ICD-10-CM | POA: Diagnosis not present

## 2018-01-08 LAB — SAMPLE TO BLOOD BANK

## 2018-01-08 LAB — CBC WITH DIFFERENTIAL/PLATELET
BASOS ABS: 0.1 10*3/uL (ref 0–0.1)
Basophils Relative: 3 %
EOS ABS: 0 10*3/uL (ref 0–0.7)
Eosinophils Relative: 0 %
HCT: 29.5 % — ABNORMAL LOW (ref 35.0–47.0)
HEMOGLOBIN: 10.1 g/dL — AB (ref 12.0–16.0)
Lymphocytes Relative: 33 %
Lymphs Abs: 0.9 10*3/uL — ABNORMAL LOW (ref 1.0–3.6)
MCH: 33.7 pg (ref 26.0–34.0)
MCHC: 34.2 g/dL (ref 32.0–36.0)
MCV: 98.7 fL (ref 80.0–100.0)
Monocytes Absolute: 0.2 10*3/uL (ref 0.2–0.9)
Monocytes Relative: 7 %
NEUTROS PCT: 57 %
Neutro Abs: 1.6 10*3/uL (ref 1.4–6.5)
Platelets: 48 10*3/uL — ABNORMAL LOW (ref 150–400)
RBC: 2.99 MIL/uL — AB (ref 3.80–5.20)
RDW: 18.1 % — ABNORMAL HIGH (ref 11.5–14.5)
WBC: 2.8 10*3/uL — AB (ref 3.6–11.0)

## 2018-01-08 LAB — BASIC METABOLIC PANEL
ANION GAP: 9 (ref 5–15)
BUN: 21 mg/dL — ABNORMAL HIGH (ref 6–20)
CALCIUM: 9.1 mg/dL (ref 8.9–10.3)
CO2: 25 mmol/L (ref 22–32)
CREATININE: 1.26 mg/dL — AB (ref 0.44–1.00)
Chloride: 105 mmol/L (ref 101–111)
GFR calc non Af Amer: 40 mL/min — ABNORMAL LOW (ref >60)
GFR, EST AFRICAN AMERICAN: 47 mL/min — AB (ref >60)
Glucose, Bld: 121 mg/dL — ABNORMAL HIGH (ref 65–99)
Potassium: 4.9 mmol/L (ref 3.5–5.1)
Sodium: 139 mmol/L (ref 135–145)

## 2018-01-12 ENCOUNTER — Encounter: Admit: 2018-01-12 | Discharge: 2018-01-12 | Payer: MEDICARE

## 2018-01-12 ENCOUNTER — Other Ambulatory Visit: Admit: 2018-01-12 | Discharge: 2018-01-12 | Payer: MEDICARE

## 2018-01-12 DIAGNOSIS — C9200 Acute myeloblastic leukemia, not having achieved remission: Principal | ICD-10-CM

## 2018-01-12 LAB — COMPREHENSIVE METABOLIC PANEL
ALBUMIN: 4 g/dL (ref 3.5–5.0)
ALKALINE PHOSPHATASE: 73 U/L (ref 38–126)
ALT (SGPT): 15 U/L (ref 15–48)
ANION GAP: 5 mmol/L — ABNORMAL LOW (ref 9–15)
AST (SGOT): 19 U/L (ref 14–38)
BILIRUBIN TOTAL: 0.9 mg/dL (ref 0.0–1.2)
BLOOD UREA NITROGEN: 22 mg/dL — ABNORMAL HIGH (ref 7–21)
BUN / CREAT RATIO: 18
CALCIUM: 9.1 mg/dL (ref 8.5–10.2)
CO2: 30 mmol/L (ref 22.0–30.0)
CREATININE: 1.2 mg/dL — ABNORMAL HIGH (ref 0.60–1.00)
EGFR MDRD AF AMER: 53 mL/min/{1.73_m2} — ABNORMAL LOW (ref >=60)
EGFR MDRD NON AF AMER: 44 mL/min/{1.73_m2} — ABNORMAL LOW (ref >=60)
POTASSIUM: 4.8 mmol/L (ref 3.5–5.0)
PROTEIN TOTAL: 6.8 g/dL (ref 6.5–8.3)
SODIUM: 141 mmol/L (ref 135–145)

## 2018-01-12 LAB — CBC W/ AUTO DIFF
BASOPHILS ABSOLUTE COUNT: 0 10*9/L (ref 0.0–0.1)
BASOPHILS RELATIVE PERCENT: 0.3 %
EOSINOPHILS RELATIVE PERCENT: 1.8 %
HEMATOCRIT: 32.3 % — ABNORMAL LOW (ref 36.0–46.0)
LARGE UNSTAINED CELLS: 5 % — ABNORMAL HIGH (ref 0–4)
LYMPHOCYTES ABSOLUTE COUNT: 0.8 10*9/L — ABNORMAL LOW (ref 1.5–5.0)
LYMPHOCYTES RELATIVE PERCENT: 26.1 %
MEAN CORPUSCULAR HEMOGLOBIN CONC: 33.8 g/dL (ref 31.0–37.0)
MEAN CORPUSCULAR HEMOGLOBIN: 33.6 pg (ref 26.0–34.0)
MEAN CORPUSCULAR VOLUME: 99.3 fL (ref 80.0–100.0)
MEAN PLATELET VOLUME: 8.7 fL (ref 7.0–10.0)
MONOCYTES ABSOLUTE COUNT: 0.2 10*9/L (ref 0.2–0.8)
MONOCYTES RELATIVE PERCENT: 6.2 %
NEUTROPHILS ABSOLUTE COUNT: 1.9 10*9/L — ABNORMAL LOW (ref 2.0–7.5)
NEUTROPHILS RELATIVE PERCENT: 60.5 %
PLATELET COUNT: 29 10*9/L — ABNORMAL LOW (ref 150–440)
RED BLOOD CELL COUNT: 3.25 10*12/L — ABNORMAL LOW (ref 4.00–5.20)
RED CELL DISTRIBUTION WIDTH: 20.2 % — ABNORMAL HIGH (ref 12.0–15.0)
WBC ADJUSTED: 3.2 10*9/L — ABNORMAL LOW (ref 4.5–11.0)

## 2018-01-12 LAB — EGFR MDRD AF AMER
Glomerular filtration rate/1.73 sq M.predicted.black:ArVRat:Pt:Ser/Plas/Bld:Qn:Creatinine-based formula (MDRD): 53 — ABNORMAL LOW

## 2018-01-12 LAB — RED BLOOD CELL COUNT: Lab: 3.25 — ABNORMAL LOW

## 2018-01-12 LAB — SLIDE REVIEW

## 2018-01-12 LAB — POLYCHROMASIA

## 2018-01-12 NOTE — Unmapped (Addendum)
I'm thrilled with the way that Jessica Wilson is working for you. It's doing what we wanted in that you haven't transfusions for months.    We'll schedule a lab and possible transfusion visit for May 30 before your dental appointment.    We can extend out our visits here because you're doing so well to every two months.    Appointment on 01/12/2018   Component Date Value Ref Range Status   ??? Sodium 01/12/2018 141  135 - 145 mmol/L Final   ??? Potassium 01/12/2018 4.8  3.5 - 5.0 mmol/L Final   ??? Chloride 01/12/2018 106  98 - 107 mmol/L Final   ??? CO2 01/12/2018 30.0  22.0 - 30.0 mmol/L Final   ??? BUN 01/12/2018 22* 7 - 21 mg/dL Final   ??? Creatinine 01/12/2018 1.20* 0.60 - 1.00 mg/dL Final   ??? BUN/Creatinine Ratio 01/12/2018 18   Final   ??? EGFR MDRD Non Af Amer 01/12/2018 44* >=60 mL/min/1.88m2 Final   ??? EGFR MDRD Af Amer 01/12/2018 53* >=60 mL/min/1.63m2 Final   ??? Anion Gap 01/12/2018 5* 9 - 15 mmol/L Final   ??? Glucose 01/12/2018 99  65 - 179 mg/dL Final   ??? Calcium 16/06/9603 9.1  8.5 - 10.2 mg/dL Final   ??? Albumin 54/05/8118 4.0  3.5 - 5.0 g/dL Final   ??? Total Protein 01/12/2018 6.8  6.5 - 8.3 g/dL Final   ??? Total Bilirubin 01/12/2018 0.9  0.0 - 1.2 mg/dL Final   ??? AST 14/78/2956 19  14 - 38 U/L Final   ??? ALT 01/12/2018 15  15 - 48 U/L Final   ??? Alkaline Phosphatase 01/12/2018 73  38 - 126 U/L Final   ??? WBC 01/12/2018 3.2* 4.5 - 11.0 10*9/L Final   ??? RBC 01/12/2018 3.25* 4.00 - 5.20 10*12/L Final   ??? HGB 01/12/2018 10.9* 12.0 - 16.0 g/dL Final   ??? HCT 21/30/8657 32.3* 36.0 - 46.0 % Final   ??? MCV 01/12/2018 99.3  80.0 - 100.0 fL Final   ??? MCH 01/12/2018 33.6  26.0 - 34.0 pg Final   ??? MCHC 01/12/2018 33.8  31.0 - 37.0 g/dL Final   ??? RDW 84/69/6295 20.2* 12.0 - 15.0 % Final   ??? MPV 01/12/2018 8.7  7.0 - 10.0 fL Final   ??? Platelet 01/12/2018 29* 150 - 440 10*9/L Final   ??? Variable HGB Concentration 01/12/2018 Slight* Not Present Final   ??? Neutrophils % 01/12/2018 60.5  % Final   ??? Lymphocytes % 01/12/2018 26.1  % Final   ??? Monocytes % 01/12/2018 6.2  % Final   ??? Eosinophils % 01/12/2018 1.8  % Final   ??? Basophils % 01/12/2018 0.3  % Final   ??? Neutrophil Left Shift 01/12/2018 3+* Not Present Final   ??? Absolute Neutrophils 01/12/2018 1.9* 2.0 - 7.5 10*9/L Final   ??? Absolute Lymphocytes 01/12/2018 0.8* 1.5 - 5.0 10*9/L Final   ??? Absolute Monocytes 01/12/2018 0.2  0.2 - 0.8 10*9/L Final   ??? Absolute Eosinophils 01/12/2018 0.1  0.0 - 0.4 10*9/L Final   ??? Absolute Basophils 01/12/2018 0.0  0.0 - 0.1 10*9/L Final   ??? Large Unstained Cells 01/12/2018 5* 0 - 4 % Final   ??? Microcytosis 01/12/2018 Slight* Not Present Final   ??? Macrocytosis 01/12/2018 Marked* Not Present Final   ??? Anisocytosis 01/12/2018 Moderate* Not Present Final   ??? Hypochromasia 01/12/2018 Slight* Not Present Final   ??? Smear Review Comments 01/12/2018 See Comment* Undefined Final  Myelocytes present.     ??? Polychromasia 01/12/2018 Slight* Not Present Final

## 2018-01-12 NOTE — Unmapped (Signed)
1125:  Labs drawn and sent for analysis.  Care provided by  Will Bonnet.

## 2018-01-13 NOTE — Unmapped (Signed)
[facility] Leukemia Clinic Follow-up    Patient Name: Jessica Wilson  Patient Age: 77 y.o.  Encounter Date: 01/12/2018    Primary Care Provider:  Kerman Passey, MD    Referring Physician:  Loyal Buba, MD  [address]  [address]  [place], [place] [address]    Reason for visit:  Follow up of MDS progressed to AML, on enasidenib    History of Present Illness:  We had the pleasure of seeing Jessica Wilson in the Leukemia Clinic at the [facility] [place] on 01/12/2018.  She is a 77 y.o. 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      Current therapy is enasidenib.    Her oncologic history is as follows:    Oncology History    Referring/Local Oncologist: Dr. Louretta Shorten, [facility]     Diagnosis: MDS with Excess Blasts-2    Genetics:   Karyotype/FISH: 46XX     Molecular Genetics: not performed    Pertinent Phenotypic data: no aberrant immunophenotype by flow cytometry, however blasts stained by IHC for CD117, MPO.  Only 5% marrow cells stained for CD34.    Disease-specific prognostic estimation: IPSS-R high risk, median OS 1.6 years, with 25% AML risk at 1.4 years            MDS (myelodysplastic syndrome), high grade (CMS-HCC)    04/17/2016 Initial Diagnosis     MDS (myelodysplastic syndrome), high grade (RAF-HCC)         04/29/2016 -  Chemotherapy     Azacitidine cycle 1: 75 mg/m2 [place] days 1-7 of 28-day cycles         05/27/2016 -  Chemotherapy     Azacitidine cycle 2: 75 mg/m2 [place] days 1-7 of 28 day cycles         06/24/2016 -  Chemotherapy     Azacitidine cycle 3: 75 mg/m2 [place] days 1-7 of 28 day cycle         07/22/2016 -  Chemotherapy     Azacitidine cycle 4: 75 mg/m2 [place] days 1-7 of 28 day cycle         08/19/2016 -  Chemotherapy     Azacitidine cycle 5: 75 mg/m2 [place] x 7 days of 28 day cycle         09/16/2016 -  Chemotherapy     Azacitidine cycle 6: 75 mbg/m2 [place] x 7 days of 28 day cycle         10/14/2016 -  Chemotherapy     Azacitidine cycle 7: 75 mg/m2 [place] x 7 days of 28 day cycle         12/09/2016 -  Chemotherapy     Azacitidine cycle 8: 60 mg/m2 [place] x 7 days of 28 day (cycle reduced by 20% due to hematologic toxicity)         01/13/2017 -  Chemotherapy     Azacitidine cycle 9: 60 mg/m2 [place] x 5 days of 28 day cycle (reduced by 2 days and 20% per dose due to hematologic toxicity)         05/29/2017 Progression     Given increasing transfusion requirements, repeat bone marrow biopsy done, now with 35% blasts and meets criteria for progression to AML.           Acute myeloid leukemia not having achieved remission (CMS-HCC)    06/02/2017 Initial Diagnosis     Acute myeloid leukemia not having achieved remission  06/28/2017 - 07/17/2017 Chemotherapy     enasidenib 100 mg by mouth daily         07/17/2017 Adverse Reaction     Acute abdominal pain, splenomegaly, AKI , elevated transaminases.  Enasidenib held thru 11/13 and treated empirically for differentiation syndrome with dexamethasone. Resumed enasidenib 07/23/17         07/23/2017 -  Chemotherapy     enasidenib 100 mg PO daily          Interim History:  Since last seen here, [name] has continued on enasidenib. Says that her appetite and weight fluctuate.    She attempted to get a follow up with urology locally but it was very far out and she's had no recurring pain so did not schedule it. She sees [facility] dentistry on 5/30 and may need platelets beforehand.    Denies infection. Denies fevers. Denies unexplained bleeding.    Denies headache. Denies CP. Denies SOB/cough. Denies abd pain/n/v/d/c. Denies LE edema. Denies rash.    Otherwise, she denies new constitutional symptoms such as anorexia, weight loss night sweats or unexplained fevers.  Furthermore, she denies symptoms of marrow failure: unexplained bleeding or bruising, recurrent or unexplained intercurrent infections, dyspnea on exertion, lightheadedness, palpitations or chest pain.  There have been no new or unexplained pains or self-identified masses, swelling or enlarged lymph nodes.    Past Medical, Surgical and Family History were reviewed and pertinent updates were made in the Electronic Medical Record    Review of Systems:  Other than as reported above in the interim history, all other systems reviewed were negative.    ECOG Performance Status: 2    Medications:    Current Outpatient Medications   Medication Sig Dispense Refill   ??? acetaminophen (TYLENOL) 325 MG tablet Take 650 mg by mouth every six (6) hours as needed for pain.     ??? acyclovir (ZOVIRAX) 400 MG tablet Take 400 mg by mouth Two (2) times a day.      ??? buPROPion (WELLBUTRIN SR) 150 MG 12 hr tablet 2 tabs po daily  1   ??? chlorhexidine (PERIDEX) 0.12 % solution 15 mL by Mouth route Two (2) times a day. 473 mL 4   ??? clonazePAM (KLONOPIN) 0.5 MG tablet Take 0.5 mg by mouth two (2) times a day as needed for anxiety.      ??? diltiazem (CARDIZEM CD) 180 MG 24 hr capsule Take 180 mg by mouth daily.     ??? doxycycline (PERIOSTAT) 20 MG tablet Take 1 tablet (20 mg total) by mouth Two (2) times a day. (Patient not taking: Reported on 12/15/2017) 60 tablet 0   ??? enasidenib (IDHIFA) 100 mg tablet Take 100 mg by mouth daily.     ??? lidocaine-prilocaine (EMLA) cream   1   ??? prochlorperazine (COMPAZINE) 10 MG tablet Take 10 mg by mouth every six (6) hours as needed for nausea.     ??? sertraline (ZOLOFT) 100 MG tablet Take 150 mg by mouth daily.      ??? tamsulosin (FLOMAX) 0.4 mg capsule Take 1 capsule (0.4 mg total) by mouth daily. 10 capsule 0   ??? tranexamic acid (LYSTEDA) 650 mg Tab tablet Take 2 tabs daily as needed for bleeding 60 tablet 2   ??? umeclidinium-vilanterol (ANORO ELLIPTA) 62.5-25 mcg/actuation inhaler Inhale 1 puff daily.       No current facility-administered medications for this visit.      Vital Signs:  Vitals:    01/12/18 1138  BP: 136/63   Pulse: 64   Resp: 18   Temp: 36.9 ??C (98.4 ??F)   SpO2: 96%     Physical Exam:  General: In NAD  HEENT: Stable disconjugate gaze. No scleral icterus or conjunctival injection. Nares show no bleeding.    Lymph node exam: Deferred  Heart:  Regular rate and rhythm. S1, S2. No murmurs, gallops or rubs.  Lungs:  Breathing is unlabored and patient is speaking full sentences with ease.  No stridor.  Auscultation of lung fields reveals normal air movement without rales, ronchi or crackles.    GI: No distention or pain on palpation.  Bowel sounds are present and normal in quality.  No palpable hepatomegaly or splenomegaly.  No palpable masses.  Skin:  No rashes, petechiae or purpura.  No areas of skin breakdown.  Musculoskeletal:  No grossly-evident joint effusions or deformities.  Range of motion about the shoulder, elbow, hips and knees is grossly normal.   Psychiatric:  Alert and oriented to person, place, time and situation.  Range of affect is appropriate.    Neurologic:    Gait is normal.  Cerebellar tasks are completed with ease and are symmetric.  Extremities:  Appear well-perfused. No clubbing, but there is 1+ bilateral edema.  No cyanosis.    Relevant Laboratory, radiology and pathology results:  I personally viewed the most recent internal records and labs and discussed the available results with the patient or family.  A summary of results follows:  Lab on 01/12/2018   Component Date Value Ref Range Status   ??? Sodium 01/12/2018 141  135 - 145 mmol/L Final   ??? Potassium 01/12/2018 4.8  3.5 - 5.0 mmol/L Final   ??? Chloride 01/12/2018 106  98 - 107 mmol/L Final   ??? CO2 01/12/2018 30.0  22.0 - 30.0 mmol/L Final   ??? BUN 01/12/2018 22* 7 - 21 mg/dL Final   ??? Creatinine 01/12/2018 1.20* 0.60 - 1.00 mg/dL Final   ??? BUN/Creatinine Ratio 01/12/2018 18   Final   ??? EGFR MDRD Non Af Amer 01/12/2018 44* >=60 mL/min/1.38m2 Final   ??? EGFR MDRD Af Amer 01/12/2018 53* >=60 mL/min/1.1m2 Final   ??? Anion Gap 01/12/2018 5* 9 - 15 mmol/L Final   ??? Glucose 01/12/2018 99  65 - 179 mg/dL Final   ??? Calcium 81/19/1478 9.1  8.5 - 10.2 mg/dL Final   ??? Albumin 29/56/2130 4.0  3.5 - 5.0 g/dL Final   ??? Total Protein 01/12/2018 6.8  6.5 - 8.3 g/dL Final   ??? Total Bilirubin 01/12/2018 0.9  0.0 - 1.2 mg/dL Final   ??? AST 86/57/8469 19  14 - 38 U/L Final   ??? ALT 01/12/2018 15  15 - 48 U/L Final   ??? Alkaline Phosphatase 01/12/2018 73  38 - 126 U/L Final   ??? WBC 01/12/2018 3.2* 4.5 - 11.0 10*9/L Final   ??? RBC 01/12/2018 3.25* 4.00 - 5.20 10*12/L Final   ??? HGB 01/12/2018 10.9* 12.0 - 16.0 g/dL Final   ??? HCT 62/95/2841 32.3* 36.0 - 46.0 % Final   ??? MCV 01/12/2018 99.3  80.0 - 100.0 fL Final   ??? MCH 01/12/2018 33.6  26.0 - 34.0 pg Final   ??? MCHC 01/12/2018 33.8  31.0 - 37.0 g/dL Final   ??? RDW 32/44/0102 20.2* 12.0 - 15.0 % Final   ??? MPV 01/12/2018 8.7  7.0 - 10.0 fL Final   ??? Platelet 01/12/2018 29* 150 - 440 10*9/L Final   ???  Variable HGB Concentration 01/12/2018 Slight* Not Present Final   ??? Neutrophils % 01/12/2018 60.5  % Final   ??? Lymphocytes % 01/12/2018 26.1  % Final   ??? Monocytes % 01/12/2018 6.2  % Final   ??? Eosinophils % 01/12/2018 1.8  % Final   ??? Basophils % 01/12/2018 0.3  % Final   ??? Neutrophil Left Shift 01/12/2018 3+* Not Present Final   ??? Absolute Neutrophils 01/12/2018 1.9* 2.0 - 7.5 10*9/L Final   ??? Absolute Lymphocytes 01/12/2018 0.8* 1.5 - 5.0 10*9/L Final   ??? Absolute Monocytes 01/12/2018 0.2  0.2 - 0.8 10*9/L Final   ??? Absolute Eosinophils 01/12/2018 0.1  0.0 - 0.4 10*9/L Final   ??? Absolute Basophils 01/12/2018 0.0  0.0 - 0.1 10*9/L Final   ??? Large Unstained Cells 01/12/2018 5* 0 - 4 % Final   ??? Microcytosis 01/12/2018 Slight* Not Present Final   ??? Macrocytosis 01/12/2018 Marked* Not Present Final   ??? Anisocytosis 01/12/2018 Moderate* Not Present Final   ??? Hypochromasia 01/12/2018 Slight* Not Present Final   ??? Smear Review Comments 01/12/2018 See Comment* Undefined Final    Myelocytes present.     ??? Polychromasia 01/12/2018 Slight* Not Present Final     Assessment:  [name] is a 77 y.o. year old female with AML progressed from previous Myelodysplastic Syndrome s/p 12 cycles of azacitidine prior to progression to AML. She initially had hematologic improvement but had eventual progression requiring more transfusions and with 38% blasts in the bone marrow. She was started on enasidenib on 06/28/17, and had clearance of peripheral blood blasts, avoidance of severe neutropenia and improvement in her hemoglobin and thrombocytopenia.    Today, [name] has fully recovered from her prior kidney stone. She continues to tolerate enasidenib very well with no s/s of differentiation syndrome. Notably, she has not need a blood transfusion since January or platelet transfusion since February.     [name] has several teeth that she believes need to be removed and will be seen by [facility] dentistry on 5/30. We will set her up for a lab check and possible platelet transfusion on that day to get her platelets >50,000 for dental extraction.    As [name] continues to tolerate enasidenib so well and has seen palliative benefit from this treatment, we will extend visits out to every two months here. She will continue once weekly lab checks locally.    Plan and Recommendations:  **AML, secondary after MDS  - Continue enasidenib 100 mg daily until progression or intolerance.  Given the fact that she is not on a clinical trial and is having obvious clinical benefit, we do not feel the need to document depth of remission with a bone marrow biopsy.  - Continue labs once weekly locally  - RTC in two months  - She is likely through the window of risk from differentiation syndrome from this therapy.  Nonetheless, if she develops fever, dyspnea, hypoxia or other end organ issues, it would be reasonable to entertain the diagnosis of differentiation syndrome.    **Hematologic support: She should receive irradiated blood products when available.  If she has emergent bleeding complications, nonirradiated blood products are acceptable.  In general, she will receive red blood cell transfusion for hemoglobin less than 8 g/dL and platelets for platelet count less than 10,000 or active bleeding.      **Infection prophylaxis: Given resolution of neutropenia, we only recommend that she remain on valacyclovir.    **Tooth  decay: scheduled to see dentistry on 5/30  - Will schedule lab and transfusion appt just beforehand.    Dr. Malen Gauze was available.    Mariana Kaufman, AGPCNP-BC  Nurse Practitioner  Hematology/Oncology  [facility]  01/12/2018

## 2018-01-15 ENCOUNTER — Encounter: Payer: Self-pay | Admitting: Internal Medicine

## 2018-01-15 ENCOUNTER — Inpatient Hospital Stay: Payer: Medicare Other | Admitting: Internal Medicine

## 2018-01-15 ENCOUNTER — Inpatient Hospital Stay: Payer: Medicare Other

## 2018-01-15 VITALS — BP 134/78 | HR 61 | Temp 97.2°F | Resp 16 | Wt 226.8 lb

## 2018-01-15 DIAGNOSIS — Z87891 Personal history of nicotine dependence: Secondary | ICD-10-CM | POA: Diagnosis not present

## 2018-01-15 DIAGNOSIS — N183 Chronic kidney disease, stage 3 (moderate): Secondary | ICD-10-CM

## 2018-01-15 DIAGNOSIS — C9200 Acute myeloblastic leukemia, not having achieved remission: Secondary | ICD-10-CM | POA: Diagnosis not present

## 2018-01-15 DIAGNOSIS — I10 Essential (primary) hypertension: Secondary | ICD-10-CM | POA: Diagnosis not present

## 2018-01-15 DIAGNOSIS — E119 Type 2 diabetes mellitus without complications: Secondary | ICD-10-CM

## 2018-01-15 DIAGNOSIS — R5383 Other fatigue: Secondary | ICD-10-CM | POA: Diagnosis not present

## 2018-01-15 LAB — BASIC METABOLIC PANEL
Anion gap: 9 (ref 5–15)
BUN: 21 mg/dL — ABNORMAL HIGH (ref 6–20)
CO2: 27 mmol/L (ref 22–32)
CREATININE: 1.27 mg/dL — AB (ref 0.44–1.00)
Calcium: 9 mg/dL (ref 8.9–10.3)
Chloride: 104 mmol/L (ref 101–111)
GFR calc Af Amer: 46 mL/min — ABNORMAL LOW (ref >60)
GFR, EST NON AFRICAN AMERICAN: 40 mL/min — AB (ref >60)
GLUCOSE: 111 mg/dL — AB (ref 65–99)
Potassium: 4.4 mmol/L (ref 3.5–5.1)
SODIUM: 140 mmol/L (ref 135–145)

## 2018-01-15 LAB — CBC WITH DIFFERENTIAL/PLATELET
BASOS PCT: 4 %
Basophils Absolute: 0.1 10*3/uL (ref 0–0.1)
EOS ABS: 0 10*3/uL (ref 0–0.7)
Eosinophils Relative: 0 %
HEMATOCRIT: 30 % — AB (ref 35.0–47.0)
Hemoglobin: 10.2 g/dL — ABNORMAL LOW (ref 12.0–16.0)
LYMPHS ABS: 0.8 10*3/uL — AB (ref 1.0–3.6)
Lymphocytes Relative: 35 %
MCH: 33.8 pg (ref 26.0–34.0)
MCHC: 34.1 g/dL (ref 32.0–36.0)
MCV: 99.2 fL (ref 80.0–100.0)
MONO ABS: 0.2 10*3/uL (ref 0.2–0.9)
MONOS PCT: 8 %
Neutro Abs: 1.2 10*3/uL — ABNORMAL LOW (ref 1.4–6.5)
Neutrophils Relative %: 53 %
Platelets: 37 10*3/uL — ABNORMAL LOW (ref 150–400)
RBC: 3.02 MIL/uL — ABNORMAL LOW (ref 3.80–5.20)
RDW: 18.7 % — AB (ref 11.5–14.5)
WBC: 2.3 10*3/uL — ABNORMAL LOW (ref 3.6–11.0)

## 2018-01-15 LAB — SAMPLE TO BLOOD BANK

## 2018-01-15 NOTE — Assessment & Plan Note (Addendum)
#  Acute myeloid leukemia; IDH-2 mutant; on Enasidenib [started 06/28/17].  Patient had partial hematologic response; last platelet transfusion February 25; and last PRBC transfusion January 11th.  #Today white count is 2.3 ANC 1.1 hemoglobin 10.3 platelets pending; if less than 10 would recommend transfusion.  Discussed with Dr. Royce Macadamia.  #Awaiting dental appointment on May 30 at [facility]; platelet transfusion thru [facility]  if low.  # Kidney stone/back pain resolved-passed the stone.  # CKD stage III stable.  # follow up with me in 4 weeks/labs. Weekly cbc/hold tubex 2.

## 2018-01-15 NOTE — Progress Notes (Signed)
[place] OFFICE PROGRESS NOTE  Patient Care Team: Lada, Satira Anis, [name] Cammie Sickle, MD as Consulting Physician (Internal Medicine) Gloriann Loan, MD as Attending Physician Erby Pian, MD as Referring Physician (Specialist) Yolonda Kida, MD as Consulting Physician (Cardiology) Elvina Mattes, Adele Schilder as Attending Physician (Podiatry) Lavonia Dana, MD as Consulting Physician (Nephrology)  Cancer Staging No matching staging information was found for the patient.   Oncology History   #JUNE 2017- Severe neutropenia/ Anemia ~hb 9/platlets- 85-100 AUG 2017- REFRACTORY ANEMIA with EXCESS BLASTS [14% blasts- BMBx]; cytogenetics/FISH-N [SNP micorarray- not done]   # AUG 21st 2017-  START AZA '75mg'$ /m2 day- 1-7 q 28 days x4 cycles; DEC 6th- BMBx- <5% blasts; hypercellular with dysplasia.   # SEP 20th 2018- ACUTE MYELOID LEUKEMIA [38% blasts on BMBx]; IDH-II MUTATION PRESENT; low FLT-3; OCT 18th 2018- ENASIDENIB [Dr.Foster; [facility]]   # CKD stage III  ------------------------------------------------------   MOLECULAR TESTING: NGS sent 06/2017 -FLT3-ITD <0.05 mutational burden -IDH2 -DNMT3A --treatment options include FLT3 inhibition and IDH2 inhibition; given low level YIF0-YDX allelic ratio [admittedly in peripheral blood, not bone marrow]- started on ENASIDENIB     MDS (myelodysplastic syndrome), high grade ([place])   04/23/2016 Initial Diagnosis    MDS (myelodysplastic syndrome), high grade (HCC)       Acute myeloid leukemia not having achieved remission ([place])      INTERVAL HISTORY:  Teresa Carrillo 77 y.o.  female pleasant patient above history of acute myeloid leukemia-IDH 2 mutation/currently on Enasidenib 100 mg a day.  Patient admits to mild fatigue.  Denies any fevers.  Denies any bleeding or bruising.  She was recently evaluated at [facility] hematology/no new recommendations.  She has an appointment with [facility]  dentistry end of the month; plan platelet transfusion if needed.  Review of Systems  Constitutional: Positive for malaise/fatigue. Negative for chills, diaphoresis, fever and weight loss.  HENT: Negative for nosebleeds and sore throat.   Eyes: Negative for double vision.  Respiratory: Negative for cough, hemoptysis, sputum production, shortness of breath and wheezing.   Cardiovascular: Negative for chest pain, palpitations, orthopnea and leg swelling.  Gastrointestinal: Negative for abdominal pain, blood in stool, constipation, diarrhea, heartburn, melena, nausea and vomiting.  Genitourinary: Negative for dysuria, frequency and urgency.  Musculoskeletal: Negative for back pain and joint pain.  Skin: Negative.  Negative for itching and rash.  Neurological: Negative for dizziness, tingling, focal weakness, weakness and headaches.  Endo/Heme/Allergies: Does not bruise/bleed easily.  Psychiatric/Behavioral: Negative for depression. The patient is not nervous/anxious and does not have insomnia.       PAST MEDICAL HISTORY :  Past Medical History:  Diagnosis Date  . Abdominal wall mass   . Anginal pain ([place])   . Anxiety   . Arthritis   . Calculus of kidney   . Cystitis   . Depression   . Diabetes mellitus without complication (HCC)    elevated A1c  . Dyspnea on exertion   . Elevated serum creatinine   . Fibrocystic breast disease   . GERD (gastroesophageal reflux disease)   . Hearing loss   . Heart murmur   . HTN (hypertension)   . Hyperlipidemia   . Kidney stones   . MDS (myelodysplastic syndrome), high grade ([place]) 04/23/2016  . Microscopic hematuria   . Mouth sores   . Mucositis due to chemotherapy   . Obesity   . Obesity, morbid ([place]) 09/24/2017   BMI > 35 with type  2 diabetes  . Pneumonia   . Risk for falls   . Sleep apnea   . Thrombocytopenia ([place])   . Urinary frequency   . Urinary urgency     PAST SURGICAL HISTORY :   Past Surgical History:  Procedure Laterality  Date  . ABDOMINAL HYSTERECTOMY    . APPENDECTOMY    . CARDIAC CATHETERIZATION     x2  . CHOLECYSTECTOMY    . COLONOSCOPY N/A 02/24/2015   Procedure: COLONOSCOPY;  Surgeon: Manya Silvas, MD;  Location: [facility] ENDOSCOPY;  Service: Endoscopy;  Laterality: N/A;  . DIAGNOSTIC LAPAROSCOPY     Removal of benign abdominal tumor  . ESOPHAGOGASTRODUODENOSCOPY N/A 02/24/2015   Procedure: ESOPHAGOGASTRODUODENOSCOPY (EGD);  Surgeon: Manya Silvas, MD;  Location: [facility] ENDOSCOPY;  Service: Endoscopy;  Laterality: N/A;  . IR FLUORO GUIDE PORT INSERTION LEFT  10/09/2017  . right eye surgery Right     FAMILY HISTORY :   Family History  Problem Relation Age of Onset  . Congestive Heart Failure Mother   . Diabetes Mother   . Coronary artery disease Mother   . Stroke Mother   . Cirrhosis Father   . Diabetes Brother     SOCIAL HISTORY:   Social History   Tobacco Use  . Smoking status: Former Smoker    Types: Cigarettes    Last attempt to quit: 09/09/1988    Years since quitting: 29.3  . Smokeless tobacco: Never Used  . Tobacco comment: quit 25 years ago  Substance Use Topics  . Alcohol use: No    Alcohol/week: 0.0 oz  . Drug use: No    ALLERGIES:  is allergic to macrobid [nitrofurantoin monohyd macro].  MEDICATIONS:  Current Outpatient Medications  Medication Sig Dispense Refill  . acyclovir (ZOVIRAX) 400 MG tablet Take 400 mg by mouth 2 (two) times daily.    Marland Kitchen albuterol (PROVENTIL HFA;VENTOLIN HFA) 108 (90 Base) MCG/ACT inhaler Inhale 2 puffs into the lungs every 6 (six) hours as needed for wheezing or shortness of breath. 1 Inhaler 2  . buPROPion (WELLBUTRIN XL) 150 MG 24 hr tablet Take 300 mg by mouth daily at 12 noon.     . chlorhexidine (PERIDEX) 0.12 % solution 15 mLs by Mouth Rinse route 2 (two) times daily.  4  . clonazePAM (KLONOPIN) 0.5 MG tablet Take 0.5 mg by mouth 2 (two) times daily.     Marland Kitchen diltiazem (CARDIZEM CD) 180 MG 24 hr capsule Take 1 capsule (180 mg total) by mouth  daily. 30 capsule 0  . IDHIFA 100 MG TABS Take 100 mg by mouth daily.    Marland Kitchen lidocaine-prilocaine (EMLA) cream Apply 1 application topically as needed. Apply small amount to port site at least 1 hour prior to it being accessed, cover with plastic wrap 30 g 1  . prochlorperazine (COMPAZINE) 10 MG tablet Take 1 tablet (10 mg total) by mouth every 6 (six) hours as needed (Nausea or vomiting). 30 tablet 1  . sertraline (ZOLOFT) 100 MG tablet Take 100 mg by mouth daily at 12 noon.     . tranexamic acid (LYSTEDA) 650 MG TABS tablet Take 2 tablets by mouth as needed for bleeding. Taking for bleeding gums    . umeclidinium-vilanterol (ANORO ELLIPTA) 62.5-25 MCG/INH AEPB Inhale 1 puff into the lungs as needed.    . ondansetron (ZOFRAN-ODT) 4 MG disintegrating tablet Take 4 mg by mouth every 8 (eight) hours as needed for nausea or vomiting.     No current facility-administered medications  for this visit.     PHYSICAL EXAMINATION: ECOG PERFORMANCE STATUS: 0 - Asymptomatic  BP 134/78 (BP Location: Left Arm, Patient Position: Sitting)   Pulse 61   Temp (!) 97.2 F (36.2 C) (Tympanic)   Resp 16   Wt 226 lb 12.8 oz (102.9 kg)   BMI 40.18 kg/m   Filed Weights   01/15/18 1033  Weight: 226 lb 12.8 oz (102.9 kg)    GENERAL: Well-nourished well-developed; Alert, no distress and comfortable.  Alone.   EYES: no pallor or icterus OROPHARYNX: no thrush or ulceration; NECK: supple; no lymph nodes felt. LYMPH:  no palpable lymphadenopathy in the axillary or inguinal regions LUNGS: Decreased breath sounds auscultation bilaterally. No wheeze or crackles HEART/CVS: regular rate & rhythm and no murmurs; No lower extremity edema ABDOMEN:abdomen soft, non-tender and normal bowel sounds. No hepatomegaly or splenomegaly.  Musculoskeletal:no cyanosis of digits and no clubbing  PSYCH: alert & oriented x 3 with fluent speech NEURO: no focal motor/sensory deficits SKIN:  no rashes or significant  lesions    LABORATORY DATA:  I have reviewed the data as listed    Component Value Date/Time   NA 140 01/15/2018 1018   NA 145 (H) 05/05/2015 1108   NA 143 11/10/2012 0435   K 4.4 01/15/2018 1018   K 3.1 (L) 11/10/2012 0435   CL 104 01/15/2018 1018   CL 111 (H) 11/10/2012 0435   CO2 27 01/15/2018 1018   CO2 29 11/10/2012 0435   GLUCOSE 111 (H) 01/15/2018 1018   GLUCOSE 103 (H) 11/10/2012 0435   BUN 21 (H) 01/15/2018 1018   BUN 20 05/05/2015 1108   BUN 14 11/10/2012 0435   CREATININE 1.27 (H) 01/15/2018 1018   CREATININE 1.20 (H) 01/23/2017 0944   CALCIUM 9.0 01/15/2018 1018   CALCIUM 8.2 (L) 11/10/2012 0435   PROT 6.7 10/01/2017 0811   PROT 7.0 05/05/2015 1108   ALBUMIN 3.9 10/01/2017 0811   ALBUMIN 4.2 05/05/2015 1108   AST 16 10/01/2017 0811   ALT 13 (L) 10/01/2017 0811   ALKPHOS 63 10/01/2017 0811   BILITOT 1.1 10/01/2017 0811   BILITOT 0.3 05/05/2015 1108   GFRNONAA 40 (L) 01/15/2018 1018   GFRNONAA 44 (L) 01/23/2017 0944   GFRAA 46 (L) 01/15/2018 1018   GFRAA 51 (L) 01/23/2017 0944    No results found for: SPEP, UPEP  Lab Results  Component Value Date   WBC 2.3 (L) 01/15/2018   NEUTROABS 1.2 (L) 01/15/2018   HGB 10.2 (L) 01/15/2018   HCT 30.0 (L) 01/15/2018   MCV 99.2 01/15/2018   PLT 37 (L) 01/15/2018      Chemistry      Component Value Date/Time   NA 140 01/15/2018 1018   NA 145 (H) 05/05/2015 1108   NA 143 11/10/2012 0435   K 4.4 01/15/2018 1018   K 3.1 (L) 11/10/2012 0435   CL 104 01/15/2018 1018   CL 111 (H) 11/10/2012 0435   CO2 27 01/15/2018 1018   CO2 29 11/10/2012 0435   BUN 21 (H) 01/15/2018 1018   BUN 20 05/05/2015 1108   BUN 14 11/10/2012 0435   CREATININE 1.27 (H) 01/15/2018 1018   CREATININE 1.20 (H) 01/23/2017 0944      Component Value Date/Time   CALCIUM 9.0 01/15/2018 1018   CALCIUM 8.2 (L) 11/10/2012 0435   ALKPHOS 63 10/01/2017 0811   AST 16 10/01/2017 0811   ALT 13 (L) 10/01/2017 0811   BILITOT 1.1 10/01/2017 9242  BILITOT 0.3 05/05/2015 1108       RADIOGRAPHIC STUDIES: I have personally reviewed the radiological images as listed and agreed with the findings in the report. No results found.   ASSESSMENT & PLAN:  Acute myeloid leukemia not having achieved remission ([place]) # Acute myeloid leukemia; IDH-2 mutant; on Enasidenib [started 06/28/17].  Patient had partial hematologic response; last platelet transfusion February 25; and last PRBC transfusion January 11th.  #Today white count is 2.3 ANC 1.1 hemoglobin 10.3 platelets pending; if less than 10 would recommend transfusion.  Discussed with Dr. Royce Macadamia.  #Awaiting dental appointment on May 30 at [facility]; platelet transfusion thru [facility]  if low.  # Kidney stone/back pain resolved-passed the stone.  # CKD stage III stable.  # follow up with me in 4 weeks/labs. Weekly cbc/hold tubex 2.    No orders of the defined types were placed in this encounter.  All questions were answered. The patient knows to call the clinic with any problems, questions or concerns.      Cammie Sickle, MD 01/16/2018 9:00 PM

## 2018-01-19 DIAGNOSIS — H2513 Age-related nuclear cataract, bilateral: Secondary | ICD-10-CM | POA: Diagnosis not present

## 2018-01-22 ENCOUNTER — Inpatient Hospital Stay: Payer: Medicare Other

## 2018-01-22 DIAGNOSIS — C9200 Acute myeloblastic leukemia, not having achieved remission: Secondary | ICD-10-CM

## 2018-01-22 DIAGNOSIS — Z87891 Personal history of nicotine dependence: Secondary | ICD-10-CM | POA: Diagnosis not present

## 2018-01-22 DIAGNOSIS — I10 Essential (primary) hypertension: Secondary | ICD-10-CM | POA: Diagnosis not present

## 2018-01-22 DIAGNOSIS — E119 Type 2 diabetes mellitus without complications: Secondary | ICD-10-CM | POA: Diagnosis not present

## 2018-01-22 DIAGNOSIS — R5383 Other fatigue: Secondary | ICD-10-CM | POA: Diagnosis not present

## 2018-01-22 DIAGNOSIS — N183 Chronic kidney disease, stage 3 (moderate): Secondary | ICD-10-CM | POA: Diagnosis not present

## 2018-01-22 LAB — BASIC METABOLIC PANEL
Anion gap: 9 (ref 5–15)
BUN: 33 mg/dL — AB (ref 6–20)
CALCIUM: 8.9 mg/dL (ref 8.9–10.3)
CO2: 24 mmol/L (ref 22–32)
Chloride: 106 mmol/L (ref 101–111)
Creatinine, Ser: 1.13 mg/dL — ABNORMAL HIGH (ref 0.44–1.00)
GFR calc Af Amer: 53 mL/min — ABNORMAL LOW (ref >60)
GFR, EST NON AFRICAN AMERICAN: 46 mL/min — AB (ref >60)
GLUCOSE: 121 mg/dL — AB (ref 65–99)
Potassium: 4.3 mmol/L (ref 3.5–5.1)
Sodium: 139 mmol/L (ref 135–145)

## 2018-01-22 LAB — CBC WITH DIFFERENTIAL/PLATELET
BASOS ABS: 0.1 10*3/uL (ref 0–0.1)
Basophils Relative: 3 %
Eosinophils Absolute: 0 10*3/uL (ref 0–0.7)
Eosinophils Relative: 0 %
HEMATOCRIT: 30.8 % — AB (ref 35.0–47.0)
Hemoglobin: 10.5 g/dL — ABNORMAL LOW (ref 12.0–16.0)
Lymphocytes Relative: 26 %
Lymphs Abs: 0.8 10*3/uL — ABNORMAL LOW (ref 1.0–3.6)
MCH: 33.9 pg (ref 26.0–34.0)
MCHC: 34.1 g/dL (ref 32.0–36.0)
MCV: 99.4 fL (ref 80.0–100.0)
MONO ABS: 0.3 10*3/uL (ref 0.2–0.9)
Monocytes Relative: 8 %
NEUTROS ABS: 2 10*3/uL (ref 1.4–6.5)
NEUTROS PCT: 63 %
Platelets: 31 10*3/uL — ABNORMAL LOW (ref 150–400)
RBC: 3.1 MIL/uL — AB (ref 3.80–5.20)
RDW: 18.6 % — AB (ref 11.5–14.5)
WBC: 3.2 10*3/uL — AB (ref 3.6–11.0)

## 2018-01-22 LAB — SAMPLE TO BLOOD BANK

## 2018-01-22 NOTE — Unmapped (Signed)
[facility] Refill Coordination Note  Specialty Medication(s): IDHIFA 100mg       Malachi Carl, DOB: [birth year]  Phone: [phone] (home) [phone] (work), Alternate phone contact: N/A  Phone or address changes today?: No  All above HIPAA information was verified with patient.  Shipping Address: [address]  [place] [address]   Insurance changes? No    Completed refill call assessment today to schedule patient's medication shipment from the [facility] [phone]).      Confirmed the medication and dosage are correct and have not changed: Yes, regimen is correct and unchanged.    Confirmed patient started or stopped the following medications in the past month:  No, there are no changes reported at this time.    Are you tolerating your medication?:  [name] reports tolerating the medication.    ADHERENCE    Is this medicine transplant or covered by Medicare Part B? No.        Did you miss any doses in the past 4 weeks? No missed doses reported.    FINANCIAL/SHIPPING    Delivery Scheduled: Yes, Expected medication delivery date: 01/27/18     The patient will receive an FSI print out for each medication shipped and additional FDA Medication Guides as required.  Patient education from [place] or Robet Leu may also be included in the shipment.    [name] did not have any additional questions at this time.    Delivery address validated in FSI scheduling system: Yes, address listed in FSI is correct.    We will follow up with patient monthly for standard refill processing and delivery.      Thank you,  [employer]   [facility] Specialty Pharmacist

## 2018-01-26 MED ORDER — ENASIDENIB 100 MG TABLET
ORAL_TABLET | Freq: Every day | ORAL | 6 refills | 0.00000 days | Status: SS
Start: 2018-01-26 — End: 2018-01-26

## 2018-01-26 MED ORDER — ENASIDENIB 100 MG TABLET: 100 mg | each | 6 refills | 0 days

## 2018-01-27 MED FILL — IDHIFA/100 MG/TAB: IDHIFA/100 MG/TAB | 30 days supply | Qty: 30 | Fill #0

## 2018-01-29 ENCOUNTER — Inpatient Hospital Stay: Payer: Medicare Other

## 2018-01-29 DIAGNOSIS — C9200 Acute myeloblastic leukemia, not having achieved remission: Secondary | ICD-10-CM | POA: Diagnosis not present

## 2018-01-29 DIAGNOSIS — R5383 Other fatigue: Secondary | ICD-10-CM | POA: Diagnosis not present

## 2018-01-29 DIAGNOSIS — E119 Type 2 diabetes mellitus without complications: Secondary | ICD-10-CM | POA: Diagnosis not present

## 2018-01-29 DIAGNOSIS — I10 Essential (primary) hypertension: Secondary | ICD-10-CM | POA: Diagnosis not present

## 2018-01-29 DIAGNOSIS — N183 Chronic kidney disease, stage 3 (moderate): Secondary | ICD-10-CM | POA: Diagnosis not present

## 2018-01-29 DIAGNOSIS — Z87891 Personal history of nicotine dependence: Secondary | ICD-10-CM | POA: Diagnosis not present

## 2018-01-29 LAB — CBC WITH DIFFERENTIAL/PLATELET
BASOS ABS: 0.1 10*3/uL (ref 0–0.1)
BASOS PCT: 2 %
EOS ABS: 0 10*3/uL (ref 0–0.7)
Eosinophils Relative: 0 %
HCT: 31.8 % — ABNORMAL LOW (ref 35.0–47.0)
Hemoglobin: 10.8 g/dL — ABNORMAL LOW (ref 12.0–16.0)
Lymphocytes Relative: 30 %
Lymphs Abs: 1 10*3/uL (ref 1.0–3.6)
MCH: 34.3 pg — ABNORMAL HIGH (ref 26.0–34.0)
MCHC: 34 g/dL (ref 32.0–36.0)
MCV: 101 fL — AB (ref 80.0–100.0)
MONOS PCT: 7 %
Monocytes Absolute: 0.2 10*3/uL (ref 0.2–0.9)
NEUTROS PCT: 61 %
Neutro Abs: 2 10*3/uL (ref 1.4–6.5)
Platelets: 46 10*3/uL — ABNORMAL LOW (ref 150–400)
RBC: 3.15 MIL/uL — ABNORMAL LOW (ref 3.80–5.20)
RDW: 19.3 % — AB (ref 11.5–14.5)
WBC: 3.2 10*3/uL — ABNORMAL LOW (ref 3.6–11.0)

## 2018-01-29 LAB — SAMPLE TO BLOOD BANK

## 2018-01-30 NOTE — Unmapped (Signed)
Easidenib refilled 01/26/18.

## 2018-02-05 ENCOUNTER — Ambulatory Visit: Admit: 2018-02-05 | Discharge: 2018-02-06 | Payer: MEDICARE

## 2018-02-05 ENCOUNTER — Encounter: Admit: 2018-02-05 | Discharge: 2018-02-06 | Payer: MEDICARE

## 2018-02-05 DIAGNOSIS — K056 Periodontal disease, unspecified: Secondary | ICD-10-CM

## 2018-02-05 DIAGNOSIS — C9200 Acute myeloblastic leukemia, not having achieved remission: Principal | ICD-10-CM

## 2018-02-05 DIAGNOSIS — K036 Deposits [accretions] on teeth: Principal | ICD-10-CM

## 2018-02-05 LAB — CBC W/ AUTO DIFF
BASOPHILS ABSOLUTE COUNT: 0 10*9/L (ref 0.0–0.1)
BASOPHILS RELATIVE PERCENT: 0.9 %
EOSINOPHILS ABSOLUTE COUNT: 0 10*9/L (ref 0.0–0.4)
EOSINOPHILS RELATIVE PERCENT: 1 %
HEMATOCRIT: 31.8 % — ABNORMAL LOW (ref 36.0–46.0)
HEMOGLOBIN: 10.5 g/dL — ABNORMAL LOW (ref 12.0–16.0)
LARGE UNSTAINED CELLS: 4 % (ref 0–4)
LYMPHOCYTES ABSOLUTE COUNT: 0.9 10*9/L — ABNORMAL LOW (ref 1.5–5.0)
LYMPHOCYTES RELATIVE PERCENT: 30 %
MEAN CORPUSCULAR HEMOGLOBIN CONC: 33.1 g/dL (ref 31.0–37.0)
MEAN CORPUSCULAR HEMOGLOBIN: 33.9 pg (ref 26.0–34.0)
MEAN CORPUSCULAR VOLUME: 102.3 fL — ABNORMAL HIGH (ref 80.0–100.0)
MEAN PLATELET VOLUME: 8.1 fL (ref 7.0–10.0)
MONOCYTES ABSOLUTE COUNT: 0.2 10*9/L (ref 0.2–0.8)
NEUTROPHILS ABSOLUTE COUNT: 1.8 10*9/L — ABNORMAL LOW (ref 2.0–7.5)
NEUTROPHILS RELATIVE PERCENT: 58.1 %
RED BLOOD CELL COUNT: 3.11 10*12/L — ABNORMAL LOW (ref 4.00–5.20)
RED CELL DISTRIBUTION WIDTH: 20.5 % — ABNORMAL HIGH (ref 12.0–15.0)
WBC ADJUSTED: 3.1 10*9/L — ABNORMAL LOW (ref 4.5–11.0)

## 2018-02-05 LAB — COMPREHENSIVE METABOLIC PANEL
ALBUMIN: 3.8 g/dL (ref 3.5–5.0)
ALKALINE PHOSPHATASE: 69 U/L (ref 38–126)
ALT (SGPT): 16 U/L (ref 15–48)
ANION GAP: 10 mmol/L (ref 9–15)
AST (SGOT): 16 U/L (ref 14–38)
BILIRUBIN TOTAL: 0.8 mg/dL (ref 0.0–1.2)
BLOOD UREA NITROGEN: 25 mg/dL — ABNORMAL HIGH (ref 7–21)
BUN / CREAT RATIO: 26
CALCIUM: 8.9 mg/dL (ref 8.5–10.2)
CHLORIDE: 109 mmol/L — ABNORMAL HIGH (ref 98–107)
CO2: 24 mmol/L (ref 22.0–30.0)
CREATININE: 0.97 mg/dL (ref 0.60–1.00)
EGFR MDRD NON AF AMER: 56 mL/min/{1.73_m2} — ABNORMAL LOW (ref >=60)
GLUCOSE RANDOM: 101 mg/dL (ref 65–179)
PROTEIN TOTAL: 6.7 g/dL (ref 6.5–8.3)
SODIUM: 143 mmol/L (ref 135–145)

## 2018-02-05 LAB — MONOCYTES ABSOLUTE COUNT: Lab: 0.2

## 2018-02-05 LAB — SMEAR REVIEW

## 2018-02-05 LAB — PLATELET COUNT
Lab: 38 — ABNORMAL LOW
Lab: 56 — ABNORMAL LOW

## 2018-02-05 LAB — CO2: Carbon dioxide:SCnc:Pt:Ser/Plas:Qn:: 24

## 2018-02-05 NOTE — Unmapped (Signed)
LSL POC accessed - patent, flushing, +BR. Labs drawn and sent for analysis.

## 2018-02-05 NOTE — Unmapped (Signed)
Post plt after 1st unit plts 38. Pt rec'd 2nd unit plts. Post 56. Pac flushed with ns and heparin and de accessed. Pt dc'd to dental clinic per wc accompanied by husband

## 2018-02-05 NOTE — Unmapped (Signed)
Patient needs platelet transfusion for dental work. Count must be 50 or greater

## 2018-02-09 NOTE — Unmapped (Signed)
I was the supervising physician in the delivery of the service. Roderick Pee, DDS    I reviewed the case with the hygienist but did not see the patient.  I agree with the assessment and plan as documented in the hygienist 's note. Roderick Pee, DDS

## 2018-02-09 NOTE — Unmapped (Signed)
Malachi Carl is a 77 yr old Caucasian female who presents today for Adult prophy.    Medical history:   Patient Active Problem List   Diagnosis   ??? MDS (myelodysplastic syndrome), high grade (CMS-HCC)   ??? Acute myeloid leukemia not having achieved remission (CMS-HCC)   ??? Anemia   ??? Arteriosclerosis of coronary artery   ??? COPD, mild (CMS-HCC)   ??? Diabetes mellitus, type 2 (CMS-HCC)   ??? Dyslipidemia   ??? Essential (primary) hypertension   ??? Proteinuria   ??? Thrombocytopenia (CMS-HCC)   ??? Paroxysmal atrial fibrillation (CMS-HCC)   ??? Panic attack       Past Medical History:   Diagnosis Date   ??? Anxiety disorder    ??? Chronic kidney disease    ??? Depression    ??? Diabetes mellitus without complication (CMS-HCC)    ??? Fibrocystic breast    ??? GERD (gastroesophageal reflux disease)    ??? Hyperlipidemia    ??? Hypertension    ??? MDS (myelodysplastic syndrome), high grade (CMS-HCC) 04/17/2016   ??? Nephrolithiasis    ??? Obesity    ??? Sleep apnea        Medications:   Current Outpatient Medications   Medication Sig Dispense Refill   ??? acetaminophen (TYLENOL) 325 MG tablet Take 650 mg by mouth every six (6) hours as needed for pain.     ??? acyclovir (ZOVIRAX) 400 MG tablet Take 400 mg by mouth Two (2) times a day.      ??? buPROPion (WELLBUTRIN SR) 150 MG 12 hr tablet 2 tabs po daily  1   ??? chlorhexidine (PERIDEX) 0.12 % solution 15 mL by Mouth route Two (2) times a day. 473 mL 4   ??? clonazePAM (KLONOPIN) 0.5 MG tablet Take 0.5 mg by mouth two (2) times a day as needed for anxiety.      ??? diltiazem (CARDIZEM CD) 180 MG 24 hr capsule Take 180 mg by mouth daily.     ??? doxycycline (PERIOSTAT) 20 MG tablet Take 1 tablet (20 mg total) by mouth Two (2) times a day. (Patient not taking: Reported on 12/15/2017) 60 tablet 0   ??? enasidenib (IDHIFA) 100 mg tablet Take 1 tablet (100 mg total) by mouth daily. 30 tablet 6   ??? lidocaine-prilocaine (EMLA) cream   1   ??? prochlorperazine (COMPAZINE) 10 MG tablet Take 10 mg by mouth every six (6) hours as needed for nausea.     ??? sertraline (ZOLOFT) 100 MG tablet Take 150 mg by mouth daily.      ??? tamsulosin (FLOMAX) 0.4 mg capsule Take 1 capsule (0.4 mg total) by mouth daily. 10 capsule 0   ??? tranexamic acid (LYSTEDA) 650 mg Tab tablet Take 2 tabs daily as needed for bleeding 60 tablet 2   ??? umeclidinium-vilanterol (ANORO ELLIPTA) 62.5-25 mcg/actuation inhaler Inhale 1 puff daily.       No current facility-administered medications for this visit.        Allergies:  Allergies   Allergen Reactions   ??? Macrobid [Nitrofurantoin Monohyd/M-Cryst]       Blood Pressure 174/73                            Pulse  68    X-Rays: Pan & BW's 11/17/17    Gingiva: Erythematous, rolled margins, bulbous, blunted papilla  Plaque present:  Moderate  Calculus present:  Moderate supra and sub-G    TX:  Full Mouth Debridement - Cavitron scaled and some hand scaling.  Patient has moderate - severe periodontal disease with Class III mobility on mand anterior teeth.  Oral Hygiene Instruction:  Reviewed brushing technique, instructed patient to brush at least 3 times daily.  Patient is currently using Chlorhexidine rinse prescribed by Dr. Zenda Alpers.  Instructed patient to dip toothbrush into rinse and brush on teeth and gums 2 - 3 times per day.  Gave patient Soft picks and instructed patient to dip picks into   Chlorhexidine and clean in between teeth 2 times a day.  Will re-evaluate gingiva and possible PSRP.    Next visit:  Re-evaluate/Perio probe    Eesa Justiss P. Jeneen Montgomery, RDH,BS

## 2018-02-12 ENCOUNTER — Encounter: Payer: Self-pay | Admitting: Nurse Practitioner

## 2018-02-12 ENCOUNTER — Other Ambulatory Visit: Payer: Self-pay

## 2018-02-12 ENCOUNTER — Inpatient Hospital Stay: Payer: Medicare Other | Attending: Nurse Practitioner

## 2018-02-12 ENCOUNTER — Inpatient Hospital Stay: Payer: Medicare Other | Admitting: Nurse Practitioner

## 2018-02-12 VITALS — BP 143/67 | HR 66 | Temp 98.6°F | Resp 20 | Wt 234.0 lb

## 2018-02-12 DIAGNOSIS — C9200 Acute myeloblastic leukemia, not having achieved remission: Secondary | ICD-10-CM | POA: Diagnosis not present

## 2018-02-12 DIAGNOSIS — Z87891 Personal history of nicotine dependence: Secondary | ICD-10-CM

## 2018-02-12 DIAGNOSIS — N183 Chronic kidney disease, stage 3 (moderate): Secondary | ICD-10-CM | POA: Insufficient documentation

## 2018-02-12 LAB — COMPREHENSIVE METABOLIC PANEL
ALT: 10 U/L — AB (ref 14–54)
AST: 18 U/L (ref 15–41)
Albumin: 3.8 g/dL (ref 3.5–5.0)
Alkaline Phosphatase: 63 U/L (ref 38–126)
Anion gap: 9 (ref 5–15)
BUN: 22 mg/dL — ABNORMAL HIGH (ref 6–20)
CHLORIDE: 109 mmol/L (ref 101–111)
CO2: 26 mmol/L (ref 22–32)
CREATININE: 1.17 mg/dL — AB (ref 0.44–1.00)
Calcium: 8.8 mg/dL — ABNORMAL LOW (ref 8.9–10.3)
GFR calc non Af Amer: 44 mL/min — ABNORMAL LOW (ref >60)
GFR, EST AFRICAN AMERICAN: 51 mL/min — AB (ref >60)
Glucose, Bld: 112 mg/dL — ABNORMAL HIGH (ref 65–99)
Potassium: 4.8 mmol/L (ref 3.5–5.1)
SODIUM: 144 mmol/L (ref 135–145)
Total Bilirubin: 1.4 mg/dL — ABNORMAL HIGH (ref 0.3–1.2)
Total Protein: 6.9 g/dL (ref 6.5–8.1)

## 2018-02-12 LAB — CBC WITH DIFFERENTIAL/PLATELET
BASOS ABS: 0.1 10*3/uL (ref 0–0.1)
Basophils Relative: 2 %
EOS ABS: 0 10*3/uL (ref 0–0.7)
EOS PCT: 0 %
HCT: 27.6 % — ABNORMAL LOW (ref 35.0–47.0)
Hemoglobin: 9.4 g/dL — ABNORMAL LOW (ref 12.0–16.0)
LYMPHS ABS: 0.9 10*3/uL — AB (ref 1.0–3.6)
Lymphocytes Relative: 35 %
MCH: 34.6 pg — AB (ref 26.0–34.0)
MCHC: 33.9 g/dL (ref 32.0–36.0)
MCV: 102 fL — ABNORMAL HIGH (ref 80.0–100.0)
Monocytes Absolute: 0.3 10*3/uL (ref 0.2–0.9)
Monocytes Relative: 10 %
Neutro Abs: 1.4 10*3/uL (ref 1.4–6.5)
Neutrophils Relative %: 53 %
PLATELETS: 41 10*3/uL — AB (ref 150–400)
RBC: 2.71 MIL/uL — AB (ref 3.80–5.20)
RDW: 18.8 % — ABNORMAL HIGH (ref 11.5–14.5)
WBC: 2.6 10*3/uL — AB (ref 3.6–11.0)

## 2018-02-12 LAB — SAMPLE TO BLOOD BANK

## 2018-02-12 NOTE — Progress Notes (Signed)
[place] OFFICE PROGRESS NOTE  Patient Care Team: Lada, Satira Anis, [name] Cammie Sickle, MD as Consulting Physician (Internal Medicine) Gloriann Loan, MD as Attending Physician Erby Pian, MD as Referring Physician (Specialist) Yolonda Kida, MD as Consulting Physician (Cardiology) Elvina Mattes, Adele Schilder as Attending Physician (Podiatry) Lavonia Dana, MD as Consulting Physician (Nephrology)  Cancer Staging No matching staging information was found for the patient.   Oncology History   #JUNE 2017- Severe neutropenia/ Anemia ~hb 9/platlets- 85-100 AUG 2017- REFRACTORY ANEMIA with EXCESS BLASTS [14% blasts- BMBx]; cytogenetics/FISH-N [SNP micorarray- not done]   # AUG 21st 2017-  START AZA '75mg'$ /m2 day- 1-7 q 28 days x4 cycles; DEC 6th- BMBx- <5% blasts; hypercellular with dysplasia.   # SEP 20th 2018- ACUTE MYELOID LEUKEMIA [38% blasts on BMBx]; IDH-II MUTATION PRESENT; low FLT-3; OCT 18th 2018- ENASIDENIB [Dr.Foster; [facility]]   # CKD stage III  ------------------------------------------------------   MOLECULAR TESTING: NGS sent 06/2017 -FLT3-ITD <0.05 mutational burden -IDH2 -DNMT3A --treatment options include FLT3 inhibition and IDH2 inhibition; given low level KGU5-KYH allelic ratio [admittedly in peripheral blood, not bone marrow]- started on ENASIDENIB     MDS (myelodysplastic syndrome), high grade ([place])   04/23/2016 Initial Diagnosis    MDS (myelodysplastic syndrome), high grade (HCC)       Acute myeloid leukemia not having achieved remission ([place])     INTERVAL HISTORY:  Teresa Carrillo 77 y.o.  female pleasant patient above history of acute myeloid leukemia-IDH 2 mutation/currently on Enasidenib 100 mg a day.  Patient was last seen by Dr. Rogue Bussing on 01/15/18. In the interim she was seen at [facility] for dental appointment. They require plt counts to be greater than 50 and thus, she required a first unit of  platelets, plt was 38, and she received 2nd unit of plts which raised count to 56. Otherwise, she has been receiving plt transfusions if plt counts were < 10.    Today, she reports continued, mild fatigue but denies any fevers, abnormal bleeding or bruising.    Review of Systems  Constitutional: Positive for malaise/fatigue. Negative for chills, diaphoresis, fever and weight loss.  HENT: Negative for nosebleeds and sore throat.   Eyes: Negative for double vision.  Respiratory: Negative for cough, hemoptysis, sputum production, shortness of breath and wheezing.   Cardiovascular: Negative for chest pain, palpitations, orthopnea and leg swelling.  Gastrointestinal: Negative for abdominal pain, blood in stool, constipation, diarrhea, heartburn, melena, nausea and vomiting.  Genitourinary: Negative for dysuria, frequency and urgency.  Musculoskeletal: Negative for back pain and joint pain.  Skin: Negative.  Negative for itching and rash.  Neurological: Negative for dizziness, tingling, focal weakness, weakness and headaches.  Endo/Heme/Allergies: Does not bruise/bleed easily.  Psychiatric/Behavioral: Negative for depression. The patient is not nervous/anxious and does not have insomnia.      PAST MEDICAL HISTORY :  Past Medical History:  Diagnosis Date  . Abdominal wall mass   . Anginal pain ([place])   . Anxiety   . Arthritis   . Calculus of kidney   . Cystitis   . Depression   . Diabetes mellitus without complication (HCC)    elevated A1c  . Dyspnea on exertion   . Elevated serum creatinine   . Fibrocystic breast disease   . GERD (gastroesophageal reflux disease)   . Hearing loss   . Heart murmur   . HTN (hypertension)   . Hyperlipidemia   . Kidney stones   .  MDS (myelodysplastic syndrome), high grade ([place]) 04/23/2016  . Microscopic hematuria   . Mouth sores   . Mucositis due to chemotherapy   . Obesity   . Obesity, morbid ([place]) 09/24/2017   BMI > 35 with type 2 diabetes  .  Pneumonia   . Risk for falls   . Sleep apnea   . Thrombocytopenia ([place])   . Urinary frequency   . Urinary urgency     PAST SURGICAL HISTORY :   Past Surgical History:  Procedure Laterality Date  . ABDOMINAL HYSTERECTOMY    . APPENDECTOMY    . CARDIAC CATHETERIZATION     x2  . CHOLECYSTECTOMY    . COLONOSCOPY N/A 02/24/2015   Procedure: COLONOSCOPY;  Surgeon: Manya Silvas, MD;  Location: [facility] ENDOSCOPY;  Service: Endoscopy;  Laterality: N/A;  . DIAGNOSTIC LAPAROSCOPY     Removal of benign abdominal tumor  . ESOPHAGOGASTRODUODENOSCOPY N/A 02/24/2015   Procedure: ESOPHAGOGASTRODUODENOSCOPY (EGD);  Surgeon: Manya Silvas, MD;  Location: [facility] ENDOSCOPY;  Service: Endoscopy;  Laterality: N/A;  . IR FLUORO GUIDE PORT INSERTION LEFT  10/09/2017  . right eye surgery Right     FAMILY HISTORY :   Family History  Problem Relation Age of Onset  . Congestive Heart Failure Mother   . Diabetes Mother   . Coronary artery disease Mother   . Stroke Mother   . Cirrhosis Father   . Diabetes Brother     SOCIAL HISTORY:   Social History   Tobacco Use  . Smoking status: Former Smoker    Types: Cigarettes    Last attempt to quit: 09/09/1988    Years since quitting: 29.4  . Smokeless tobacco: Never Used  . Tobacco comment: quit 25 years ago  Substance Use Topics  . Alcohol use: No    Alcohol/week: 0.0 oz  . Drug use: No    ALLERGIES:  is allergic to macrobid [nitrofurantoin monohyd macro].  MEDICATIONS:  Current Outpatient Medications  Medication Sig Dispense Refill  . acyclovir (ZOVIRAX) 400 MG tablet Take 400 mg by mouth 2 (two) times daily.    Marland Kitchen albuterol (PROVENTIL HFA;VENTOLIN HFA) 108 (90 Base) MCG/ACT inhaler Inhale 2 puffs into the lungs every 6 (six) hours as needed for wheezing or shortness of breath. 1 Inhaler 2  . buPROPion (WELLBUTRIN XL) 150 MG 24 hr tablet Take 300 mg by mouth daily at 12 noon.     . chlorhexidine (PERIDEX) 0.12 % solution 15 mLs by Mouth Rinse  route 2 (two) times daily.  4  . clonazePAM (KLONOPIN) 0.5 MG tablet Take 0.5 mg by mouth 2 (two) times daily.     Marland Kitchen diltiazem (CARDIZEM CD) 180 MG 24 hr capsule Take 1 capsule (180 mg total) by mouth daily. 30 capsule 0  . IDHIFA 100 MG TABS Take 100 mg by mouth daily.    Marland Kitchen lidocaine-prilocaine (EMLA) cream Apply 1 application topically as needed. Apply small amount to port site at least 1 hour prior to it being accessed, cover with plastic wrap 30 g 1  . sertraline (ZOLOFT) 100 MG tablet Take 100 mg by mouth daily at 12 noon.     . tranexamic acid (LYSTEDA) 650 MG TABS tablet Take 2 tablets by mouth as needed for bleeding. Taking for bleeding gums    . umeclidinium-vilanterol (ANORO ELLIPTA) 62.5-25 MCG/INH AEPB Inhale 1 puff into the lungs as needed.    . prochlorperazine (COMPAZINE) 10 MG tablet Take 1 tablet (10 mg total) by mouth every 6 (  six) hours as needed (Nausea or vomiting). (Patient not taking: Reported on 02/12/2018) 30 tablet 1   No current facility-administered medications for this visit.     PHYSICAL EXAMINATION: ECOG PERFORMANCE STATUS: 0 - Asymptomatic  BP (!) 143/67 (Patient Position: Sitting)   Pulse 66   Temp 98.6 F (37 C) (Tympanic)   Resp 20   Wt 234 lb (106.1 kg)   SpO2 96%   BMI 41.45 kg/m   Filed Weights   02/12/18 1027  Weight: 234 lb (106.1 kg)    GENERAL: Well-nourished well-developed; Alert, no distress and comfortable. Alone.   EYES: no pallor or icterus OROPHARYNX: no thrush or ulceration NECK: supple; no lymph nodes felt LYMPH: no palpable lymphadenopathy in the axillary or inguinal regions LUNGS: Decreased breath sounds auscultation bilaterally. No wheeze or crackles HEART/CVS: regular rate & rhythm and no murmurs; No lower extremity edema ABDOMEN: abdomen soft, non-tender and normal bowel sounds. No hepatomegaly or splenomegaly.  Musculoskeletal: no cyanosis of digits and no clubbing  PSYCH: alert & oriented x 3 with fluent speech NEURO: no  focal motor/sensory deficits SKIN: no rashes or significant lesions   LABORATORY DATA:  I have reviewed the data as listed    Component Value Date/Time   NA 144 02/12/2018 1030   NA 145 (H) 05/05/2015 1108   NA 143 11/10/2012 0435   K 4.8 02/12/2018 1030   K 3.1 (L) 11/10/2012 0435   CL 109 02/12/2018 1030   CL 111 (H) 11/10/2012 0435   CO2 26 02/12/2018 1030   CO2 29 11/10/2012 0435   GLUCOSE 112 (H) 02/12/2018 1030   GLUCOSE 103 (H) 11/10/2012 0435   BUN 22 (H) 02/12/2018 1030   BUN 20 05/05/2015 1108   BUN 14 11/10/2012 0435   CREATININE 1.17 (H) 02/12/2018 1030   CREATININE 1.20 (H) 01/23/2017 0944   CALCIUM 8.8 (L) 02/12/2018 1030   CALCIUM 8.2 (L) 11/10/2012 0435   PROT 6.9 02/12/2018 1030   PROT 7.0 05/05/2015 1108   ALBUMIN 3.8 02/12/2018 1030   ALBUMIN 4.2 05/05/2015 1108   AST 18 02/12/2018 1030   ALT 10 (L) 02/12/2018 1030   ALKPHOS 63 02/12/2018 1030   BILITOT 1.4 (H) 02/12/2018 1030   BILITOT 0.3 05/05/2015 1108   GFRNONAA 44 (L) 02/12/2018 1030   GFRNONAA 44 (L) 01/23/2017 0944   GFRAA 51 (L) 02/12/2018 1030   GFRAA 51 (L) 01/23/2017 0944    No results found for: SPEP, UPEP  Lab Results  Component Value Date   WBC 2.6 (L) 02/12/2018   NEUTROABS 1.4 02/12/2018   HGB 9.4 (L) 02/12/2018   HCT 27.6 (L) 02/12/2018   MCV 102.0 (H) 02/12/2018   PLT PENDING 02/12/2018      Chemistry      Component Value Date/Time   NA 144 02/12/2018 1030   NA 145 (H) 05/05/2015 1108   NA 143 11/10/2012 0435   K 4.8 02/12/2018 1030   K 3.1 (L) 11/10/2012 0435   CL 109 02/12/2018 1030   CL 111 (H) 11/10/2012 0435   CO2 26 02/12/2018 1030   CO2 29 11/10/2012 0435   BUN 22 (H) 02/12/2018 1030   BUN 20 05/05/2015 1108   BUN 14 11/10/2012 0435   CREATININE 1.17 (H) 02/12/2018 1030   CREATININE 1.20 (H) 01/23/2017 0944      Component Value Date/Time   CALCIUM 8.8 (L) 02/12/2018 1030   CALCIUM 8.2 (L) 11/10/2012 0435   ALKPHOS 63 02/12/2018 1030   AST  18  02/12/2018 1030   ALT 10 (L) 02/12/2018 1030   BILITOT 1.4 (H) 02/12/2018 1030   BILITOT 0.3 05/05/2015 1108       RADIOGRAPHIC STUDIES: I have personally reviewed the radiological images as listed and agreed with the findings in the report. No results found.   ASSESSMENT & PLAN:   Acute Myeloid Leukemia- IDH-2 mutant. Currently on Enasidenib (started 06/28/17). She had partial hematologic response with last platelet transfusion 11/03/17 and last pRBC transfusion 09/19/17. She did receive 2 units of platelets at [facility] on 02/05/18 for dental procedure as plt count must be greater than 50. Post 1 unit plt count was 38, post 2nd unit plt count was 56.   Today, plt 41, hmg 9.4. No need to transfuse today. No need to transfuse today. Would consider platelet transfusion if plt < 10 or symptomatic. Would consider pRBC transfusion if hmg < 7 or < 8 and symptomatic.   WBC 2.6. ANC 1.4 (improved).   CKD- Stage III- stable. Cr 1.17. Continue to monitor.     Orders Placed This Encounter  Procedures  . CBC with Differential/Platelet    Standing Status:   Standing    Number of Occurrences:   20    Standing Expiration Date:   02/13/2019    Order Specific Question:   CC Results    Answer:   Cammie Sickle [[id number]]  . Comprehensive metabolic panel    Standing Status:   Future    Standing Expiration Date:   02/12/2019    Order Specific Question:   Has the patient fasted?    Answer:   No    Order Specific Question:   CC Results    AnswerCammie Sickle [id number]  . Hold Tube- Blood Bank    Standing Status:   Standing    Number of Occurrences:   20    Standing Expiration Date:   02/13/2019   All questions were answered. The patient knows to call the clinic with any problems, questions or concerns.   rtc in weekly for cbc & hold tube then in 4 weeks cbc, cmp, hold tube and re-evaluation with Dr. Rogue Bussing.   Beckey Rutter, DNP, AGNP-C [place] at [facility] [phone] (work cell) [phone] (office)

## 2018-02-16 DIAGNOSIS — H2513 Age-related nuclear cataract, bilateral: Secondary | ICD-10-CM | POA: Diagnosis not present

## 2018-02-18 ENCOUNTER — Ambulatory Visit: Admit: 2018-02-18 | Discharge: 2018-02-19 | Payer: MEDICARE | Attending: General Practice | Primary: General Practice

## 2018-02-18 DIAGNOSIS — Z0120 Encounter for dental examination and cleaning without abnormal findings: Principal | ICD-10-CM

## 2018-02-18 MED ORDER — DOXYCYCLINE HYCLATE 20 MG TABLET
ORAL_TABLET | Freq: Two times a day (BID) | ORAL | 0 refills | 0 days | Status: SS
Start: 2018-02-18 — End: 2018-03-20

## 2018-02-18 NOTE — Unmapped (Signed)
[facility] Refill Coordination Note  Specialty Medication(s): IDHIFA 100MG   Additional Medications shipped: Jessica Wilson, DOB: [birth year]  Phone: [phone] (home) [phone] (work), Alternate phone contact: N/A  Phone or address changes today?: No  All above HIPAA information was verified with patient.  Shipping Address: [address]  [place] [address]   Insurance changes? No    Completed refill call assessment today to schedule patient's medication shipment from the [facility] [phone]).      Confirmed the medication and dosage are correct and have not changed: Yes, regimen is correct and unchanged.    Confirmed patient started or stopped the following medications in the past month:  No, there are no changes reported at this time.    Are you tolerating your medication?:  Jessica Wilson reports tolerating the medication.    ADHERENCE    Did you miss any doses in the past 4 weeks? No missed doses reported.    FINANCIAL/SHIPPING    Delivery Scheduled: Yes, Expected medication delivery date: 02/24/18     The patient will receive an FSI print out for each medication shipped and additional FDA Medication Guides as required.  Patient education from [place] or Robet Leu may also be included in the shipment    Jessica Wilson did not have any additional questions at this time.    Delivery address validated in FSI scheduling system: Yes, address listed in FSI is correct.    We will follow up with patient monthly for standard refill processing and delivery.      Thank you,  Thad Ranger   [facility] Specialty Pharmacist

## 2018-02-18 NOTE — Unmapped (Signed)
This pleasant 77 y.o.  female  presents to [facility] for  comprehensive evaluation.      Pt referred by her oncologist Dr. Malen Gauze    CC  Gingival inflammation      HPI  Pt was initially diagnosed with MDS 04/23/2016, she underwent a chemo regimen.  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 She also developed pneumononia Nov 2018.    Since initiating the new chemo regimen she has noticed an improved quality of life, as prior to this she was almost bedridden 2017-18 and was uanble to function.  She is doing better and today her hemoglobin is now 10.9 and her platelets are 12. She recieves platelets 1x per week and her  range over the past year has been 10-50.  She has previously had spontaneous oral bleeding. She feels that with her cleaning and use of Doxycline and peridex that she has had much less spontaneous oral bleeding and her mouth feels better.  Last week her platetets were 51K.    Reviewed medical history and surgical history  Past Medical History:   Diagnosis Date   ??? Anxiety disorder    ??? Chronic kidney disease    ??? Depression    ??? Diabetes mellitus without complication (CMS-HCC)    ??? Fibrocystic breast    ??? GERD (gastroesophageal reflux disease)    ??? Hyperlipidemia    ??? Hypertension    ??? MDS (myelodysplastic syndrome), high grade (CMS-HCC) 04/17/2016   ??? Nephrolithiasis    ??? Obesity    ??? Sleep apnea      Past Surgical History:   Procedure Laterality Date   ??? CARDIAC CATHETERIZATION     ??? CHOLECYSTECTOMY  02/24/2015   ??? DIAGNOSTIC LAPAROSCOPY      removal of benign abdominal tumor   ??? EXCISION / BIOPSY BREAST / NIPPLE / DUCT Right Age 27   ??? HYSTERECTOMY  age 13       Past Dental Hx      Meds  Current Outpatient Medications   Medication Sig Dispense Refill   ??? acetaminophen (TYLENOL) 325 MG tablet Take 650 mg by mouth every six (6) hours as needed for pain.     ??? acyclovir (ZOVIRAX) 400 MG tablet Take 400 mg by mouth Two (2) times a day.      ??? buPROPion (WELLBUTRIN SR) 150 MG 12 hr tablet 2 tabs po daily  1   ??? chlorhexidine (PERIDEX) 0.12 % solution 15 mL by Mouth route Two (2) times a day. 473 mL 4   ??? clonazePAM (KLONOPIN) 0.5 MG tablet Take 0.5 mg by mouth two (2) times a day as needed for anxiety.      ??? diltiazem (CARDIZEM CD) 180 MG 24 hr capsule Take 180 mg by mouth daily.     ??? doxycycline (PERIOSTAT) 20 MG tablet Take 1 tablet (20 mg total) by mouth Two (2) times a day. (Patient not taking: Reported on 12/15/2017) 60 tablet 0   ??? enasidenib (IDHIFA) 100 mg tablet Take 1 tablet (100 mg total) by mouth daily. 30 tablet 6   ??? lidocaine-prilocaine (EMLA) cream   1   ??? prochlorperazine (COMPAZINE) 10 MG tablet Take 10 mg by mouth every six (6) hours as needed for nausea.     ??? sertraline (ZOLOFT) 100 MG tablet Take 150 mg by mouth daily.      ??? tamsulosin (FLOMAX) 0.4 mg capsule Take 1 capsule (0.4 mg  total) by mouth daily. 10 capsule 0   ??? tranexamic acid (LYSTEDA) 650 mg Tab tablet Take 2 tabs daily as needed for bleeding 60 tablet 2   ??? umeclidinium-vilanterol (ANORO ELLIPTA) 62.5-25 mcg/actuation inhaler Inhale 1 puff daily.       No current facility-administered medications for this visit.        Habit Hx:   reports that she quit smoking about 29 years ago. Her smoking use included cigarettes. She quit smokeless tobacco use about 26 years ago. She reports that she does not drink alcohol or use drugs.    Vitals  Vitals:    02/18/18 0824   BP: 163/72   Pulse: 70   Temp: 35.6 ??C (96.1 ??F)   SpO2: 97%       Allergies  Macrobid [nitrofurantoin monohyd/m-cryst]  ??  Exam:  Radiographic  Panorex and PA's taken reveal significant generalized horizontal bone loss, several missing teeth and no bony pathology.  Minimal bony support for anterior teeth.     ??    ??  ??  ??  Extraoral  Thyroid midline (+), cervical region(+), sinus (-), facial assymetry (+), TMJ(-), eye lid lag  ??  Intraoral  ??  Soft tissue              Mucosal changes- pale tissues associated with anemia,                Oral cancer examination detects no suspicious oral lesions              Minimal plaque, calculus, gingiva edematous, pink and pulls away from teeth in the area  of #10.               No Probing because of low platelet count??  Hard tissue              -Missing 1,5,13,16-19,30-32              -Restored , 4PFM, 7L resin, 8L resin, 9DL resin10L resin, 12-14 PFM bridge,  15DOamal              -Caries- none  ??  Assessment  ??  Aggressive periodontal disease in the setting of immunosuppression  Disease improving with oral intervention        Plan  Arrest and stabilize periodontal disease   1) RX-  continuedoxycycline and chlorhexidine  2) If platelets do not remain above 50, Will need to coordinate with Dr. Malen Gauze to administer platelets prior to cleaning    Fabricate upper and lower partials to replace maxillary teeth(7-10) and mandibular teeth (18,19,23-26,30,31)  3) long term recommend fabrication of upper and lower partial denture , extraction of anterior teeth due to bone loss (teeth 7-10 and 23-26) , delivery of partials on day of extraction because pt is concerned about being without teeth.  Regarding the esthetics of the partial, she is interested in not having diastemas and would like to make sure that the anterior teeth are not flared.

## 2018-02-19 ENCOUNTER — Inpatient Hospital Stay: Payer: Medicare Other

## 2018-02-19 DIAGNOSIS — C9200 Acute myeloblastic leukemia, not having achieved remission: Secondary | ICD-10-CM | POA: Diagnosis not present

## 2018-02-19 DIAGNOSIS — N183 Chronic kidney disease, stage 3 (moderate): Secondary | ICD-10-CM | POA: Diagnosis not present

## 2018-02-19 DIAGNOSIS — Z87891 Personal history of nicotine dependence: Secondary | ICD-10-CM | POA: Diagnosis not present

## 2018-02-19 LAB — CBC WITH DIFFERENTIAL/PLATELET
BASOS ABS: 0 10*3/uL (ref 0–0.1)
BASOS PCT: 2 %
EOS ABS: 0 10*3/uL (ref 0–0.7)
EOS PCT: 0 %
HCT: 29.4 % — ABNORMAL LOW (ref 35.0–47.0)
Hemoglobin: 9.8 g/dL — ABNORMAL LOW (ref 12.0–16.0)
Lymphocytes Relative: 37 %
Lymphs Abs: 0.8 10*3/uL — ABNORMAL LOW (ref 1.0–3.6)
MCH: 34.5 pg — AB (ref 26.0–34.0)
MCHC: 33.5 g/dL (ref 32.0–36.0)
MCV: 102.9 fL — ABNORMAL HIGH (ref 80.0–100.0)
Monocytes Absolute: 0.2 10*3/uL (ref 0.2–0.9)
Monocytes Relative: 11 %
Neutro Abs: 1.2 10*3/uL — ABNORMAL LOW (ref 1.4–6.5)
Neutrophils Relative %: 50 %
PLATELETS: 61 10*3/uL — AB (ref 150–440)
RBC: 2.85 MIL/uL — AB (ref 3.80–5.20)
RDW: 18.2 % — ABNORMAL HIGH (ref 11.5–14.5)
WBC: 2.3 10*3/uL — ABNORMAL LOW (ref 3.6–11.0)

## 2018-02-19 LAB — SAMPLE TO BLOOD BANK

## 2018-02-23 MED FILL — IDHIFA/100 MG/TAB: IDHIFA/100 MG/TAB | 30 days supply | Qty: 30 | Fill #1

## 2018-02-24 ENCOUNTER — Emergency Department
Admission: EM | Admit: 2018-02-24 | Discharge: 2018-02-24 | Disposition: A | Discharge status: Home / Self Care | Payer: Medicare Other | Attending: Emergency Medicine | Admitting: Emergency Medicine

## 2018-02-24 ENCOUNTER — Other Ambulatory Visit: Payer: Self-pay

## 2018-02-24 ENCOUNTER — Encounter: Payer: Self-pay | Admitting: Emergency Medicine

## 2018-02-24 DIAGNOSIS — R040 Epistaxis: Secondary | ICD-10-CM

## 2018-02-24 DIAGNOSIS — Z79899 Other long term (current) drug therapy: Secondary | ICD-10-CM | POA: Diagnosis not present

## 2018-02-24 DIAGNOSIS — Z87891 Personal history of nicotine dependence: Secondary | ICD-10-CM | POA: Insufficient documentation

## 2018-02-24 DIAGNOSIS — E1140 Type 2 diabetes mellitus with diabetic neuropathy, unspecified: Secondary | ICD-10-CM | POA: Insufficient documentation

## 2018-02-24 DIAGNOSIS — I10 Essential (primary) hypertension: Secondary | ICD-10-CM | POA: Diagnosis not present

## 2018-02-24 DIAGNOSIS — D696 Thrombocytopenia, unspecified: Secondary | ICD-10-CM | POA: Insufficient documentation

## 2018-02-24 DIAGNOSIS — J449 Chronic obstructive pulmonary disease, unspecified: Secondary | ICD-10-CM | POA: Diagnosis not present

## 2018-02-24 LAB — CBC WITH DIFFERENTIAL/PLATELET
BAND NEUTROPHILS: 1 %
BASOS ABS: 0 10*3/uL (ref 0–0.1)
BLASTS: 0 %
Basophils Relative: 1 %
EOS ABS: 0.1 10*3/uL (ref 0–0.7)
Eosinophils Relative: 2 %
HEMATOCRIT: 31.2 % — AB (ref 35.0–47.0)
HEMOGLOBIN: 10.7 g/dL — AB (ref 12.0–16.0)
LYMPHS PCT: 34 %
Lymphs Abs: 1.2 10*3/uL (ref 1.0–3.6)
MCH: 34.7 pg — ABNORMAL HIGH (ref 26.0–34.0)
MCHC: 34.3 g/dL (ref 32.0–36.0)
MCV: 101 fL — AB (ref 80.0–100.0)
METAMYELOCYTES PCT: 1 %
MONOS PCT: 3 %
Monocytes Absolute: 0.1 10*3/uL — ABNORMAL LOW (ref 0.2–0.9)
Myelocytes: 0 %
NEUTROS ABS: 2 10*3/uL (ref 1.4–6.5)
Neutrophils Relative %: 58 %
OTHER: 0 %
PROMYELOCYTES RELATIVE: 0 %
Platelets: UNDETERMINED 10*3/uL (ref 150–440)
RBC: 3.09 MIL/uL — AB (ref 3.80–5.20)
RDW: 18.1 % — ABNORMAL HIGH (ref 11.5–14.5)
WBC: 3.4 10*3/uL — AB (ref 3.6–11.0)
nRBC: 2 /100 WBC — ABNORMAL HIGH

## 2018-02-24 LAB — BASIC METABOLIC PANEL
ANION GAP: 10 (ref 5–15)
BUN: 28 mg/dL — ABNORMAL HIGH (ref 6–20)
CHLORIDE: 108 mmol/L (ref 101–111)
CO2: 23 mmol/L (ref 22–32)
Calcium: 8.7 mg/dL — ABNORMAL LOW (ref 8.9–10.3)
Creatinine, Ser: 1.35 mg/dL — ABNORMAL HIGH (ref 0.44–1.00)
GFR calc Af Amer: 43 mL/min — ABNORMAL LOW (ref >60)
GFR calc non Af Amer: 37 mL/min — ABNORMAL LOW (ref >60)
GLUCOSE: 132 mg/dL — AB (ref 65–99)
Potassium: 3.9 mmol/L (ref 3.5–5.1)
Sodium: 141 mmol/L (ref 135–145)

## 2018-02-24 LAB — PLATELET COUNT
Platelets: UNDETERMINED 10*3/uL (ref 150–440)
Platelets: 47 10*3/uL — ABNORMAL LOW (ref 150–440)

## 2018-02-24 MED ORDER — METOPROLOL TARTRATE 25 MG PO TABS
25.0000 mg | ORAL_TABLET | Freq: Once | ORAL | Status: AC
  Administered 2018-02-24: 25 mg via ORAL
  Filled 2018-02-24: qty 1

## 2018-02-24 MED ORDER — OXYMETAZOLINE HCL 0.05 % NA SOLN
NASAL | Status: AC
  Administered 2018-02-24: 06:00:00
  Filled 2018-02-24: qty 15

## 2018-02-24 MED ORDER — CEPHALEXIN 500 MG PO CAPS: 500.0000 mg | ORAL_CAPSULE | Freq: Three times a day (TID) | ORAL | 0 refills | Status: AC

## 2018-02-24 MED ORDER — LIDOCAINE HCL (PF) 1 % IJ SOLN
INTRAMUSCULAR | Status: AC
  Administered 2018-02-24: 06:00:00
  Filled 2018-02-24: qty 5

## 2018-02-24 NOTE — ED Triage Notes (Addendum)
Pt presents to ED with nose bleed. Onset approx 2 hours ago. Pt states she sprayed afrin in her nose and now the blood is only running down the back of her throat. Pt clearing her throat frequently during triage and spitting bloody sputum into a paper towel. Denies pain or injury.

## 2018-02-24 NOTE — ED Provider Notes (Signed)
[facility] Emergency Department Provider Note   ____________________________________________   First MD Initiated Contact with Patient 02/24/18 0041     (approximate)  I have reviewed the triage vital signs and the nursing notes.   HISTORY  Chief Complaint Epistaxis    HPI Teresa Carrillo is a 77 y.o. female who comes into the hospital today with a nosebleed.  The patient has a history of leukemia and reports that tonight as she was getting into bed her nose started bleeding badly.  She reports that the blood was coming out from the right and was going down in the back of her throat.  The patient states that she has not needed a transfusion or platelet transfusion since January.  She reports that she sprayed some Afrin in her nose and it stopped the bleeding from the front but the posterior bleeding continued.  The patient states that she had blood work done last Thursday and her platelets were 60.  She is here today for treatment and evaluation.   Past Medical History:  Diagnosis Date  . Abdominal wall mass   . Anginal pain ([place])   . Anxiety   . Arthritis   . Calculus of kidney   . Cystitis   . Depression   . Diabetes mellitus without complication (HCC)    elevated A1c  . Dyspnea on exertion   . Elevated serum creatinine   . Fibrocystic breast disease   . GERD (gastroesophageal reflux disease)   . Hearing loss   . Heart murmur   . HTN (hypertension)   . Hyperlipidemia   . Kidney stones   . MDS (myelodysplastic syndrome), high grade ([place]) 04/23/2016  . Microscopic hematuria   . Mouth sores   . Mucositis due to chemotherapy   . Obesity   . Obesity, morbid ([place]) 09/24/2017   BMI > 35 with type 2 diabetes  . Pneumonia   . Risk for falls   . Sleep apnea   . Thrombocytopenia ([place])   . Urinary frequency   . Urinary urgency     Patient Active Problem List   Diagnosis Date Noted  . Medication monitoring encounter 12/24/2017  . Paroxysmal  atrial fibrillation ([place]) 09/24/2017  . Obesity, morbid ([place]) 09/24/2017  . Panic attack 06/11/2017  . Acute myeloid leukemia not having achieved remission ([place]) 06/02/2017  . Encounter for antineoplastic chemotherapy 02/17/2017  . Type 2 diabetes mellitus with nephropathy ([place]) 01/24/2017  . Thrombocytopenia ([place]) 10/21/2016  . Mucosal bleeding 06/19/2016  . MDS (myelodysplastic syndrome), high grade ([place]) 04/23/2016  . Anemia 02/21/2016  . Tick-borne disease 02/21/2016  . Cough 02/19/2016  . Proteinuria 05/10/2015  . Microscopic hematuria 05/08/2015  . Renal stone 05/08/2015  . Dyslipidemia 05/05/2015  . Hyperlipidemia, unspecified 05/05/2015  . Arteriosclerosis of coronary artery 02/02/2015  . H/O: HTN (hypertension) 02/02/2015  . COPD, mild ([place]) 05/02/2014  . Breathlessness on exertion 05/02/2014    Past Surgical History:  Procedure Laterality Date  . ABDOMINAL HYSTERECTOMY    . APPENDECTOMY    . CARDIAC CATHETERIZATION     x2  . CHOLECYSTECTOMY    . COLONOSCOPY N/A 02/24/2015   Procedure: COLONOSCOPY;  Surgeon: Manya Silvas, MD;  Location: [facility] ENDOSCOPY;  Service: Endoscopy;  Laterality: N/A;  . DIAGNOSTIC LAPAROSCOPY     Removal of benign abdominal tumor  . ESOPHAGOGASTRODUODENOSCOPY N/A 02/24/2015   Procedure: ESOPHAGOGASTRODUODENOSCOPY (EGD);  Surgeon: Manya Silvas, MD;  Location: [facility] ENDOSCOPY;  Service: Endoscopy;  Laterality: N/A;  .  IR IMAGING GUIDED PORT INSERTION  10/09/2017  . right eye surgery Right     Prior to Admission medications   Medication Sig Start Date End Date Taking? Authorizing Provider  acyclovir (ZOVIRAX) 400 MG tablet Take 400 mg by mouth 2 (two) times daily.    [provider]  albuterol (PROVENTIL HFA;VENTOLIN HFA) 108 (90 Base) MCG/ACT inhaler Inhale 2 puffs into the lungs every 6 (six) hours as needed for wheezing or shortness of breath. 08/13/17   Jacquelin Hawking, NP  buPROPion (WELLBUTRIN XL) 150 MG 24 hr tablet Take  300 mg by mouth daily at 12 noon.     [provider]  cephALEXin (KEFLEX) 500 MG capsule Take 1 capsule (500 mg total) by mouth 3 (three) times daily for 5 days. 02/24/18 03/01/18  Loney Hering, MD  chlorhexidine (PERIDEX) 0.12 % solution 15 mLs by Mouth Rinse route 2 (two) times daily. 11/17/17   [provider]  clonazePAM (KLONOPIN) 0.5 MG tablet Take 0.5 mg by mouth 2 (two) times daily.     [provider]  diltiazem (CARDIZEM CD) 180 MG 24 hr capsule Take 1 capsule (180 mg total) by mouth daily. 03/27/17 03/27/18  Darel Hong, MD  IDHIFA 100 MG TABS Take 100 mg by mouth daily. 08/17/17   [provider]  lidocaine-prilocaine (EMLA) cream Apply 1 application topically as needed. Apply small amount to port site at least 1 hour prior to it being accessed, cover with plastic wrap 10/13/17   Cammie Sickle, MD  prochlorperazine (COMPAZINE) 10 MG tablet Take 1 tablet (10 mg total) by mouth every 6 (six) hours as needed (Nausea or vomiting). Patient not taking: Reported on 02/12/2018 04/23/16   Cammie Sickle, MD  sertraline (ZOLOFT) 100 MG tablet Take 100 mg by mouth daily at 12 noon.     [provider]  tranexamic acid (LYSTEDA) 650 MG TABS tablet Take 2 tablets by mouth as needed for bleeding. Taking for bleeding gums 10/20/17   [provider]  umeclidinium-vilanterol (ANORO ELLIPTA) 62.5-25 MCG/INH AEPB Inhale 1 puff into the lungs as needed. 09/22/17   [provider]    Allergies Macrobid [nitrofurantoin monohyd macro]  Family History  Problem Relation Age of Onset  . Congestive Heart Failure Mother   . Diabetes Mother   . Coronary artery disease Mother   . Stroke Mother   . Cirrhosis Father   . Diabetes Brother     Social History Social History   Tobacco Use  . Smoking status: Former Smoker    Types: Cigarettes    Last attempt to quit: 09/09/1988    Years since quitting: 29.4  . Smokeless tobacco: Never  Used  . Tobacco comment: quit 25 years ago  Substance Use Topics  . Alcohol use: No    Alcohol/week: 0.0 oz  . Drug use: No    Review of Systems  Constitutional: No fever/chills Eyes: No visual changes. ENT: Nosebleed Cardiovascular: Denies chest pain. Respiratory: Denies shortness of breath. Gastrointestinal: No abdominal pain.  No nausea, no vomiting.   Genitourinary: Negative for dysuria. Musculoskeletal: Negative for back pain. Skin: Negative for rash. Neurological: Negative for headaches, focal weakness or numbness.   ____________________________________________   PHYSICAL EXAM:  VITAL SIGNS: ED Triage Vitals [02/24/18 0019]  Enc Vitals Group     BP (!) 174/78     Pulse Rate 83     Resp 18     Temp 98.9 F (37.2 C)  Temp Source Oral     SpO2 97 %     Weight 234 lb (106.1 kg)     Height 5\' 4"  (1.626 m)     Head Circumference      Peak Flow      Pain Score 0     Pain Loc      Pain Edu?      Excl. in [place]?     Constitutional: Alert and oriented. Well appearing and in moderate distress. Eyes: Conjunctivae are normal. PERRL. EOMI. Head: Atraumatic. Nose: Dried blood in the right nostril Mouth/Throat: Mucous membranes are moist.  Oropharynx non-erythematous.  Trail of blood in the right posterior oropharynx Cardiovascular: Normal rate, regular rhythm. Grossly normal heart sounds.  Good peripheral circulation. Respiratory: Normal respiratory effort.  No retractions. Lungs CTAB. Gastrointestinal: Soft and nontender. No distention.  Positive bowel sounds Musculoskeletal: No lower extremity tenderness nor edema.   Neurologic:  Normal speech and language.  Skin:  Skin is warm, dry and intact.  Psychiatric: Mood and affect are normal.   ____________________________________________   LABS (all labs ordered are listed, but only abnormal results are displayed)  Labs Reviewed  CBC WITH DIFFERENTIAL/PLATELET - Abnormal; Notable for the following components:       Result Value   WBC 3.4 (*)    RBC 3.09 (*)    Hemoglobin 10.7 (*)    HCT 31.2 (*)    MCV 101.0 (*)    MCH 34.7 (*)    RDW 18.1 (*)    nRBC 2 (*)    Monocytes Absolute 0.1 (*)    All other components within normal limits  BASIC METABOLIC PANEL - Abnormal; Notable for the following components:   Glucose, Bld 132 (*)    BUN 28 (*)    Creatinine, Ser 1.35 (*)    Calcium 8.7 (*)    GFR calc non Af Amer 37 (*)    GFR calc Af Amer 43 (*)    All other components within normal limits  PLATELET COUNT - Abnormal; Notable for the following components:   Platelets 47 (*)    All other components within normal limits  PLATELET COUNT   ____________________________________________  EKG  none ____________________________________________  RADIOLOGY  ED MD interpretation:  none  Official radiology report(s): No results found.  ____________________________________________   PROCEDURES  Procedure(s) performed: please, see procedure note(s).  .Epistaxis Management Date/Time: 02/24/2018 1:00 AM Performed by: Loney Hering, MD Authorized by: Loney Hering, MD   Consent:    Consent obtained:  Verbal   Consent given by:  Patient Anesthesia (see MAR for exact dosages):    Anesthesia method:  Topical application   Topical anesthesia: atomized lidocaine. Procedure details:    Treatment site:  R posterior   Treatment method:  Merocel sponge   Treatment episode: initial   Post-procedure details:    Assessment:  Bleeding decreased   Patient tolerance of procedure:  Tolerated well, no immediate complications    Critical Care performed: No  ____________________________________________   INITIAL IMPRESSION / ASSESSMENT AND PLAN / ED COURSE  As part of my medical decision making, I reviewed the following data within the electronic MEDICAL RECORD NUMBER Notes from prior ED visits and [place] Controlled Substance Database   This is a 77 year old female with leukemia who comes in  with a nosebleed.  I did place a Merocel sponge into the patient's nostril to help the posterior bleeding to decrease.  Given the patient's history of thrombocytopenia and leukemia  I did order some blood work to include a CBC with differential and BMP.  The blood work came back with a hemoglobin of 10.7 and a hematocrit of 31.2.  The patient's platelets though were unable to be resulted because there were platelet clumps on the smear.  We repeated platelets and it again had some platelet clumps.  We did contact the lab and they gave [place] a different to to send the blood and to try to get an accurate platelet count.  The patient's posterior bleeding has stopped and she does have some occasional oozing from the Merocel sponge.  She otherwise has no complaints and is comfortable at this time.   I did go back to reassess the patient and she did have some blood coming around the [place] packing.  I did decide to remove the packing and place a nasal balloon.  After placing the balloon and inflating it we were able to keep the bleeding controlled.  I did contact the otolaryngologist on-call and he felt that changing the balloon was appropriate.  I did give the patient a dose of metoprolol as her blood pressure was slightly elevated.  After monitoring the patient for over an hour the bleeding appears to have stopped.  She will be discharged home to follow-up with ENT.     ____________________________________________   FINAL CLINICAL IMPRESSION(S) / ED DIAGNOSES  Final diagnoses:  Epistaxis  Thrombocytopenia [facility])     ED Discharge Orders        Ordered    cephALEXin (KEFLEX) 500 MG capsule  3 times daily     02/24/18 0754       Note:  This document was prepared using Dragon voice recognition software and may include unintentional dictation errors.    Loney Hering, MD 02/24/18 [phone]

## 2018-02-24 NOTE — ED Notes (Signed)
Pt nose began to bleed tonight while getting ready for bed. Pt has Hx of cancer and she take chemo. Pt is alert and oriented x 4. Denies any pain. Blood has stopped coming out of her nose at this time.

## 2018-02-24 NOTE — Discharge Instructions (Signed)
Please follow up with ENT to have them remove the packing.

## 2018-02-26 ENCOUNTER — Inpatient Hospital Stay: Payer: Medicare Other

## 2018-02-26 DIAGNOSIS — Z87891 Personal history of nicotine dependence: Secondary | ICD-10-CM | POA: Diagnosis not present

## 2018-02-26 DIAGNOSIS — N183 Chronic kidney disease, stage 3 (moderate): Secondary | ICD-10-CM | POA: Diagnosis not present

## 2018-02-26 DIAGNOSIS — C9200 Acute myeloblastic leukemia, not having achieved remission: Secondary | ICD-10-CM | POA: Diagnosis not present

## 2018-02-26 LAB — CBC WITH DIFFERENTIAL/PLATELET
Basophils Absolute: 0.1 10*3/uL (ref 0–0.1)
Basophils Relative: 1 %
EOS PCT: 0 %
Eosinophils Absolute: 0 10*3/uL (ref 0–0.7)
HCT: 29.5 % — ABNORMAL LOW (ref 35.0–47.0)
Hemoglobin: 10.1 g/dL — ABNORMAL LOW (ref 12.0–16.0)
LYMPHS ABS: 1.2 10*3/uL (ref 1.0–3.6)
Lymphocytes Relative: 23 %
MCH: 34.5 pg — AB (ref 26.0–34.0)
MCHC: 34.3 g/dL (ref 32.0–36.0)
MCV: 100.7 fL — AB (ref 80.0–100.0)
MONO ABS: 0.7 10*3/uL (ref 0.2–0.9)
MONOS PCT: 13 %
Neutro Abs: 3.2 10*3/uL (ref 1.4–6.5)
Neutrophils Relative %: 63 %
PLATELETS: 43 10*3/uL — AB (ref 150–400)
RBC: 2.93 MIL/uL — AB (ref 3.80–5.20)
RDW: 17.1 % — ABNORMAL HIGH (ref 11.5–14.5)
WBC: 5.2 10*3/uL (ref 3.6–11.0)

## 2018-02-26 LAB — SAMPLE TO BLOOD BANK

## 2018-02-26 MED ORDER — SODIUM CHLORIDE 0.9% FLUSH
10.0000 mL | Freq: Once | INTRAVENOUS | Status: AC
  Administered 2018-02-26: 10 mL via INTRAVENOUS
  Filled 2018-02-26: qty 10

## 2018-02-26 MED ORDER — HEPARIN SOD (PORK) LOCK FLUSH 100 UNIT/ML IV SOLN
500.0000 [IU] | Freq: Once | INTRAVENOUS | Status: AC
  Administered 2018-02-26: 500 [IU] via INTRAVENOUS

## 2018-02-27 ENCOUNTER — Telehealth: Payer: Self-pay | Admitting: *Deleted

## 2018-02-27 DIAGNOSIS — R040 Epistaxis: Secondary | ICD-10-CM | POA: Diagnosis not present

## 2018-02-27 DIAGNOSIS — R51 Headache: Secondary | ICD-10-CM | POA: Diagnosis not present

## 2018-02-27 NOTE — Telephone Encounter (Signed)
Call attempt to patient's home and cell phone. Left msg for patient.

## 2018-02-27 NOTE — Telephone Encounter (Signed)
-----   Message from Verlon Au, NP sent at 02/27/2018  3:05 PM EDT ----- Could you please contact patient regarding results. I see that she was in ED on 02/24/18 for nosebleed. Counts decreased but stable. No blood or platelets at this time unless she is symptomatic. Please let me know if questions or concerns. Thanks.   Ander Purpura, NP   ----- Message ----- From: Buel Ream, Lab In [place] Sent: 02/26/2018  12:29 PM To: Verlon Au, NP

## 2018-03-05 ENCOUNTER — Inpatient Hospital Stay: Payer: Medicare Other

## 2018-03-05 ENCOUNTER — Other Ambulatory Visit: Payer: Self-pay

## 2018-03-05 DIAGNOSIS — C9200 Acute myeloblastic leukemia, not having achieved remission: Secondary | ICD-10-CM | POA: Diagnosis not present

## 2018-03-05 DIAGNOSIS — Z87891 Personal history of nicotine dependence: Secondary | ICD-10-CM | POA: Diagnosis not present

## 2018-03-05 DIAGNOSIS — N183 Chronic kidney disease, stage 3 (moderate): Secondary | ICD-10-CM | POA: Diagnosis not present

## 2018-03-05 LAB — CBC WITH DIFFERENTIAL/PLATELET
BASOS PCT: 1 %
Basophils Absolute: 0 10*3/uL (ref 0–0.1)
Eosinophils Absolute: 0 10*3/uL (ref 0–0.7)
Eosinophils Relative: 0 %
HCT: 29.7 % — ABNORMAL LOW (ref 35.0–47.0)
HEMOGLOBIN: 9.9 g/dL — AB (ref 12.0–16.0)
Lymphocytes Relative: 44 %
Lymphs Abs: 1.7 10*3/uL (ref 1.0–3.6)
MCH: 33.9 pg (ref 26.0–34.0)
MCHC: 33.2 g/dL (ref 32.0–36.0)
MCV: 102.1 fL — ABNORMAL HIGH (ref 80.0–100.0)
MONO ABS: 0.4 10*3/uL (ref 0.2–0.9)
MONOS PCT: 11 %
NEUTROS ABS: 1.7 10*3/uL (ref 1.4–6.5)
Neutrophils Relative %: 44 %
Platelets: 56 10*3/uL — ABNORMAL LOW (ref 150–440)
RBC: 2.91 MIL/uL — ABNORMAL LOW (ref 3.80–5.20)
RDW: 17.2 % — AB (ref 11.5–14.5)
WBC: 3.8 10*3/uL (ref 3.6–11.0)

## 2018-03-05 LAB — SAMPLE TO BLOOD BANK

## 2018-03-08 ENCOUNTER — Emergency Department
Admission: EM | Admit: 2018-03-08 | Discharge: 2018-03-08 | Disposition: A | Discharge status: Home / Self Care | Payer: Medicare Other | Attending: Emergency Medicine | Admitting: Emergency Medicine

## 2018-03-08 ENCOUNTER — Emergency Department: Payer: Medicare Other

## 2018-03-08 ENCOUNTER — Other Ambulatory Visit: Payer: Self-pay

## 2018-03-08 DIAGNOSIS — E119 Type 2 diabetes mellitus without complications: Secondary | ICD-10-CM | POA: Diagnosis not present

## 2018-03-08 DIAGNOSIS — N200 Calculus of kidney: Secondary | ICD-10-CM | POA: Diagnosis not present

## 2018-03-08 DIAGNOSIS — R109 Unspecified abdominal pain: Secondary | ICD-10-CM | POA: Diagnosis not present

## 2018-03-08 DIAGNOSIS — I10 Essential (primary) hypertension: Secondary | ICD-10-CM | POA: Insufficient documentation

## 2018-03-08 DIAGNOSIS — Z79899 Other long term (current) drug therapy: Secondary | ICD-10-CM | POA: Diagnosis not present

## 2018-03-08 DIAGNOSIS — Z87891 Personal history of nicotine dependence: Secondary | ICD-10-CM | POA: Insufficient documentation

## 2018-03-08 DIAGNOSIS — R1032 Left lower quadrant pain: Secondary | ICD-10-CM | POA: Diagnosis present

## 2018-03-08 LAB — BASIC METABOLIC PANEL
Anion gap: 8 (ref 5–15)
BUN: 17 mg/dL (ref 8–23)
CHLORIDE: 108 mmol/L (ref 98–111)
CO2: 22 mmol/L (ref 22–32)
Calcium: 8.5 mg/dL — ABNORMAL LOW (ref 8.9–10.3)
Creatinine, Ser: 1.29 mg/dL — ABNORMAL HIGH (ref 0.44–1.00)
GFR calc non Af Amer: 39 mL/min — ABNORMAL LOW (ref >60)
GFR, EST AFRICAN AMERICAN: 45 mL/min — AB (ref >60)
Glucose, Bld: 162 mg/dL — ABNORMAL HIGH (ref 70–99)
POTASSIUM: 4.4 mmol/L (ref 3.5–5.1)
SODIUM: 138 mmol/L (ref 135–145)

## 2018-03-08 LAB — CBC
HEMATOCRIT: 29.9 % — AB (ref 35.0–47.0)
HEMOGLOBIN: 10.2 g/dL — AB (ref 12.0–16.0)
MCH: 34 pg (ref 26.0–34.0)
MCHC: 34.1 g/dL (ref 32.0–36.0)
MCV: 99.8 fL (ref 80.0–100.0)
Platelets: 54 10*3/uL — ABNORMAL LOW (ref 150–440)
RBC: 3 MIL/uL — AB (ref 3.80–5.20)
RDW: 17 % — ABNORMAL HIGH (ref 11.5–14.5)
WBC: 4.6 10*3/uL (ref 3.6–11.0)

## 2018-03-08 LAB — URINALYSIS, COMPLETE (UACMP) WITH MICROSCOPIC
Bacteria, UA: NONE SEEN
Bilirubin Urine: NEGATIVE
GLUCOSE, UA: NEGATIVE mg/dL
Ketones, ur: NEGATIVE mg/dL
Leukocytes, UA: NEGATIVE
NITRITE: NEGATIVE
PH: 5 (ref 5.0–8.0)
PROTEIN: 100 mg/dL — AB
RBC / HPF: >50 RBC/hpf — ABNORMAL HIGH (ref 0–5)
Specific Gravity, Urine: 1.01 (ref 1.005–1.030)

## 2018-03-08 MED ORDER — ONDANSETRON HCL 4 MG/2ML IJ SOLN
4.0000 mg | Freq: Once | INTRAMUSCULAR | Status: AC
  Administered 2018-03-08: 4 mg via INTRAVENOUS
  Filled 2018-03-08: qty 2

## 2018-03-08 MED ORDER — SODIUM CHLORIDE 0.9 % IV BOLUS
500.0000 mL | Freq: Once | INTRAVENOUS | Status: AC
  Administered 2018-03-08: 500 mL via INTRAVENOUS

## 2018-03-08 MED ORDER — ACETAMINOPHEN 500 MG PO TABS: 1000.0000 mg | ORAL_TABLET | Freq: Three times a day (TID) | ORAL | 0 refills | Status: DC

## 2018-03-08 MED ORDER — ONDANSETRON 4 MG PO TBDP: 4.0000 mg | ORAL_TABLET | Freq: Three times a day (TID) | ORAL | 0 refills | Status: AC | PRN

## 2018-03-08 MED ORDER — TAMSULOSIN HCL 0.4 MG PO CAPS: 0.4000 mg | ORAL_CAPSULE | Freq: Every day | ORAL | 0 refills | Status: AC

## 2018-03-08 MED ORDER — OXYCODONE HCL 5 MG PO TABS: 5.0000 mg | ORAL_TABLET | Freq: Three times a day (TID) | ORAL | 0 refills | Status: AC | PRN

## 2018-03-08 MED ORDER — MORPHINE SULFATE (PF) 4 MG/ML IV SOLN
4.0000 mg | Freq: Once | INTRAVENOUS | Status: AC
  Administered 2018-03-08: 4 mg via INTRAVENOUS
  Filled 2018-03-08: qty 1

## 2018-03-08 MED ORDER — OXYCODONE HCL 5 MG PO TABS
5.0000 mg | ORAL_TABLET | Freq: Once | ORAL | Status: AC
  Administered 2018-03-08: 5 mg via ORAL
  Filled 2018-03-08: qty 1

## 2018-03-08 MED ORDER — TAMSULOSIN HCL 0.4 MG PO CAPS
0.4000 mg | ORAL_CAPSULE | Freq: Once | ORAL | Status: AC
  Administered 2018-03-08: 0.4 mg via ORAL
  Filled 2018-03-08: qty 1

## 2018-03-08 MED ORDER — ACETAMINOPHEN 500 MG PO TABS
1000.0000 mg | ORAL_TABLET | Freq: Once | ORAL | Status: AC
  Administered 2018-03-08: 1000 mg via ORAL
  Filled 2018-03-08: qty 2

## 2018-03-08 NOTE — ED Provider Notes (Signed)
[facility] Emergency Department Provider Note  ____________________________________________  Time seen: Approximately 4:46 PM  I have reviewed the triage vital signs and the nursing notes.   HISTORY  Chief Complaint Flank Pain   HPI Teresa Carrillo is a 77 y.o. female history of leukemia on oral chemotherapy, kidney stones, diabetes, hypertension, hyperlipidemia who presents for evaluation of left flank pain.  Patient reports sudden onset of severe sharp left flank pain radiating to the left lower quadrant that started at 9 AM this morning.  Pain is associated with nausea and nonbloody nonbilious emesis.  She reports that the pain is similar to prior kidney stones.  Patient reports that she is passed her stones in the past without need for stenting or lithotripsy.  She denies dysuria or hematuria, fever or chills, diarrhea or constipation.  No shortness of breath or cough. Past Medical History:  Diagnosis Date  . Abdominal wall mass   . Anginal pain ([place])   . Anxiety   . Arthritis   . Calculus of kidney   . Cystitis   . Depression   . Diabetes mellitus without complication (HCC)    elevated A1c  . Dyspnea on exertion   . Elevated serum creatinine   . Fibrocystic breast disease   . GERD (gastroesophageal reflux disease)   . Hearing loss   . Heart murmur   . HTN (hypertension)   . Hyperlipidemia   . Kidney stones   . MDS (myelodysplastic syndrome), high grade ([place]) 04/23/2016  . Microscopic hematuria   . Mouth sores   . Mucositis due to chemotherapy   . Obesity   . Obesity, morbid ([place]) 09/24/2017   BMI > 35 with type 2 diabetes  . Pneumonia   . Risk for falls   . Sleep apnea   . Thrombocytopenia ([place])   . Urinary frequency   . Urinary urgency     Patient Active Problem List   Diagnosis Date Noted  . Medication monitoring encounter 12/24/2017  . Paroxysmal atrial fibrillation ([place]) 09/24/2017  . Obesity, morbid ([place]) 09/24/2017  . Panic  attack 06/11/2017  . Acute myeloid leukemia not having achieved remission ([place]) 06/02/2017  . Encounter for antineoplastic chemotherapy 02/17/2017  . Type 2 diabetes mellitus with nephropathy ([place]) 01/24/2017  . Thrombocytopenia ([place]) 10/21/2016  . Mucosal bleeding 06/19/2016  . MDS (myelodysplastic syndrome), high grade ([place]) 04/23/2016  . Anemia 02/21/2016  . Tick-borne disease 02/21/2016  . Cough 02/19/2016  . Proteinuria 05/10/2015  . Microscopic hematuria 05/08/2015  . Renal stone 05/08/2015  . Dyslipidemia 05/05/2015  . Hyperlipidemia, unspecified 05/05/2015  . Arteriosclerosis of coronary artery 02/02/2015  . H/O: HTN (hypertension) 02/02/2015  . COPD, mild ([place]) 05/02/2014  . Breathlessness on exertion 05/02/2014    Past Surgical History:  Procedure Laterality Date  . ABDOMINAL HYSTERECTOMY    . APPENDECTOMY    . CARDIAC CATHETERIZATION     x2  . CHOLECYSTECTOMY    . COLONOSCOPY N/A 02/24/2015   Procedure: COLONOSCOPY;  Surgeon: Manya Silvas, MD;  Location: [facility] ENDOSCOPY;  Service: Endoscopy;  Laterality: N/A;  . DIAGNOSTIC LAPAROSCOPY     Removal of benign abdominal tumor  . ESOPHAGOGASTRODUODENOSCOPY N/A 02/24/2015   Procedure: ESOPHAGOGASTRODUODENOSCOPY (EGD);  Surgeon: Manya Silvas, MD;  Location: [facility] ENDOSCOPY;  Service: Endoscopy;  Laterality: N/A;  . IR IMAGING GUIDED PORT INSERTION  10/09/2017  . right eye surgery Right     Prior to Admission medications   Medication Sig Start Date End  Date Taking? Authorizing Provider  acetaminophen (TYLENOL) 500 MG tablet Take 2 tablets (1,000 mg total) by mouth every 8 (eight) hours for 5 days. 03/08/18 03/13/18  Rudene Re, MD  acyclovir (ZOVIRAX) 400 MG tablet Take 400 mg by mouth 2 (two) times daily.    [provider]  albuterol (PROVENTIL HFA;VENTOLIN HFA) 108 (90 Base) MCG/ACT inhaler Inhale 2 puffs into the lungs every 6 (six) hours as needed for wheezing or shortness of breath. 08/13/17   Jacquelin Hawking, NP  buPROPion (WELLBUTRIN XL) 150 MG 24 hr tablet Take 300 mg by mouth daily at 12 noon.     [provider]  chlorhexidine (PERIDEX) 0.12 % solution 15 mLs by Mouth Rinse route 2 (two) times daily. 11/17/17   [provider]  clonazePAM (KLONOPIN) 0.5 MG tablet Take 0.5 mg by mouth 2 (two) times daily.     [provider]  diltiazem (CARDIZEM CD) 180 MG 24 hr capsule Take 1 capsule (180 mg total) by mouth daily. 03/27/17 03/27/18  Darel Hong, MD  IDHIFA 100 MG TABS Take 100 mg by mouth daily. 08/17/17   [provider]  lidocaine-prilocaine (EMLA) cream Apply 1 application topically as needed. Apply small amount to port site at least 1 hour prior to it being accessed, cover with plastic wrap 10/13/17   Cammie Sickle, MD  ondansetron (ZOFRAN ODT) 4 MG disintegrating tablet Take 1 tablet (4 mg total) by mouth every 8 (eight) hours as needed for nausea or vomiting. 03/08/18   Alfred Levins, [place], MD  oxyCODONE (ROXICODONE) 5 MG immediate release tablet Take 1 tablet (5 mg total) by mouth every 8 (eight) hours as needed. 03/08/18 03/08/19  Rudene Re, MD  prochlorperazine (COMPAZINE) 10 MG tablet Take 1 tablet (10 mg total) by mouth every 6 (six) hours as needed (Nausea or vomiting). Patient not taking: Reported on 02/12/2018 04/23/16   Cammie Sickle, MD  sertraline (ZOLOFT) 100 MG tablet Take 100 mg by mouth daily at 12 noon.     [provider]  tamsulosin (FLOMAX) 0.4 MG CAPS capsule Take 1 capsule (0.4 mg total) by mouth daily for 7 days. 03/08/18 03/15/18  Rudene Re, MD  tranexamic acid (LYSTEDA) 650 MG TABS tablet Take 2 tablets by mouth as needed for bleeding. Taking for bleeding gums 10/20/17   [provider]  umeclidinium-vilanterol (ANORO ELLIPTA) 62.5-25 MCG/INH AEPB Inhale 1 puff into the lungs as needed. 09/22/17   [provider]    Allergies Macrobid [nitrofurantoin monohyd macro]  Family  History  Problem Relation Age of Onset  . Congestive Heart Failure Mother   . Diabetes Mother   . Coronary artery disease Mother   . Stroke Mother   . Cirrhosis Father   . Diabetes Brother     Social History Social History   Tobacco Use  . Smoking status: Former Smoker    Types: Cigarettes    Last attempt to quit: 09/09/1988    Years since quitting: 29.5  . Smokeless tobacco: Never Used  . Tobacco comment: quit 25 years ago  Substance Use Topics  . Alcohol use: No    Alcohol/week: 0.0 oz  . Drug use: No    Review of Systems  Constitutional: Negative for fever. Eyes: Negative for visual changes. ENT: Negative for sore throat. Neck: No neck pain  Cardiovascular: Negative for chest pain. Respiratory: Negative for shortness of breath. Gastrointestinal: Negative for abdominal pain or diarrhea. + N/V Genitourinary: Negative for dysuria. + L  flank pain Musculoskeletal: Negative for back pain. Skin: Negative for rash. Neurological: Negative for headaches, weakness or numbness. Psych: No SI or HI  ____________________________________________   PHYSICAL EXAM:  VITAL SIGNS: ED Triage Vitals [03/08/18 1618]  Enc Vitals Group     BP (!) 177/70     Pulse Rate 79     Resp 18     Temp 98.6 F (37 C)     Temp Source Oral     SpO2 97 %     Weight 234 lb (106.1 kg)     Height 5\' 3"  (1.6 m)     Head Circumference      Peak Flow      Pain Score 10     Pain Loc      Pain Edu?      Excl. in [place]?     Constitutional: Alert and oriented. Well appearing and in no apparent distress. HEENT:      Head: Normocephalic and atraumatic.         Eyes: Conjunctivae are normal. Sclera is non-icteric.       Mouth/Throat: Mucous membranes are moist.       Neck: Supple with no signs of meningismus. Cardiovascular: Regular rate and rhythm. No murmurs, gallops, or rubs. 2+ symmetrical distal pulses are present in all extremities. No JVD. Respiratory: Normal respiratory effort. Lungs are  clear to auscultation bilaterally. No wheezes, crackles, or rhonchi.  Gastrointestinal: Soft, non tender, and non distended with positive bowel sounds. No rebound or guarding. Genitourinary: L CVA tenderness. Musculoskeletal: Nontender with normal range of motion in all extremities. No edema, cyanosis, or erythema of extremities. Neurologic: Normal speech and language. Face is symmetric. Moving all extremities. No gross focal neurologic deficits are appreciated. Skin: Skin is warm, dry and intact. No rash noted. Psychiatric: Mood and affect are normal. Speech and behavior are normal.  ____________________________________________   LABS (all labs ordered are listed, but only abnormal results are displayed)  Labs Reviewed  URINALYSIS, COMPLETE (UACMP) WITH MICROSCOPIC - Abnormal; Notable for the following components:      Result Value   Color, Urine YELLOW (*)    APPearance HAZY (*)    Hgb urine dipstick LARGE (*)    Protein, ur 100 (*)    RBC / HPF >50 (*)    Crystals PRESENT (*)    All other components within normal limits  CBC - Abnormal; Notable for the following components:   RBC 3.00 (*)    Hemoglobin 10.2 (*)    HCT 29.9 (*)    RDW 17.0 (*)    Platelets 54 (*)    All other components within normal limits  BASIC METABOLIC PANEL - Abnormal; Notable for the following components:   Glucose, Bld 162 (*)    Creatinine, Ser 1.29 (*)    Calcium 8.5 (*)    GFR calc non Af Amer 39 (*)    GFR calc Af Amer 45 (*)    All other components within normal limits   ____________________________________________  EKG  none  ____________________________________________  RADIOLOGY  I have personally reviewed the images performed during this visit and I agree with the Radiologist's read.   Interpretation by Radiologist:  Ct Renal Stone Study  Result Date: 03/08/2018 CLINICAL DATA:  Left flank pain. History of kidney stones. Currently on chemotherapy for leukemia. EXAM: CT ABDOMEN  AND PELVIS WITHOUT CONTRAST TECHNIQUE: Multidetector CT imaging of the abdomen and pelvis was performed following the standard protocol without IV contrast. COMPARISON:  CT abdomen pelvis dated July 17, 2017. FINDINGS: Lower chest: Diffuse perilymphatic and peribronchovascular flame shaped nodular opacities at the lung bases. Hepatobiliary: Scattered calcified granulomas within the liver are unchanged. No new focal liver abnormality. Status post cholecystectomy. Mild common bile duct and intrahepatic biliary dilatation is similar to prior study. Pancreas: Unremarkable. No pancreatic ductal dilatation or surrounding inflammatory changes. Spleen: Stable to slight interval decrease in size of the spleen, now measuring 13.1 x 7.3 x 13.8 cm (volume of 659 cc, previously 710 cc). Multiple splenic granulomas are unchanged. Adrenals/Urinary Tract: The adrenal glands are unremarkable. Stable bilateral renal cysts. There are multiple new bilateral renal calculi measuring up to 1.3 cm. There are multiple small layering calculi at the left UVJ with resultant mild left hydroureteronephrosis. The bladder is unremarkable. Stomach/Bowel: Small hiatal hernia, unchanged. The stomach is otherwise within normal limits. No bowel wall thickening, distention, or surrounding inflammatory changes. Prior appendectomy. Vascular/Lymphatic: Aortic atherosclerosis. Interval decrease in size of the lymph node at the diaphragmatic hiatus, now measuring 10 mm in short axis, previously 1.6 cm. Scattered subcentimeter retroperitoneal mesenteric lymph nodes are unchanged. Reproductive: Status post hysterectomy. No adnexal masses. Other: Interval enlargement of the nonspecific left anterior upper abdominal wall soft tissue nodule now measuring 2.1 x 2.4 cm, previously 2.1 x 1.7 cm. No abdominopelvic ascites. No pneumoperitoneum. Musculoskeletal: No acute or significant osseous findings. IMPRESSION: 1. Multiple small layering calculi at the left UVJ  with resultant mild left hydroureteronephrosis. 2. Multiple new bilateral renal calculi measuring up to 1.3 cm. 3. Diffuse perilymphatic and peribronchovascular flame shaped nodular opacities at the lung bases, suspicious for atypical infection given currently on chemotherapy. 4. Stable to slightly improved splenomegaly. 5. Decreased epigastric lymph node, now measuring 10 mm in short axis, previously 16 mm. 6. Interval enlargement of the nonspecific left anterior upper abdominal wall soft tissue nodule, now measuring 2.1 x 2.4 cm, previously 2.1 x 1.7 cm. 7.  Aortic atherosclerosis (ICD10-I70.0). Electronically Signed   By: Titus Dubin M.D.   On: 03/08/2018 17:47      ____________________________________________   PROCEDURES  Procedure(s) performed: None Procedures Critical Care performed:  None ____________________________________________   INITIAL IMPRESSION / ASSESSMENT AND PLAN / ED COURSE  77 y.o. female history of leukemia on oral chemotherapy, kidney stones, diabetes, hypertension, hyperlipidemia who presents for evaluation of left flank pain.  Patient is well-appearing, no distress, normal vital signs, she has left CVA tenderness, abdomen is soft and nontender.  Differential diagnoses including kidney stone versus pyelonephritis versus diverticulitis versus peptic ulcer disease versus gastritis.  Patient has no chest pain, no shortness of breath, the pain is not pleuritic in nature, and normal vital signs therefore PE less likely.  Plan for IV morphine and Zofran, CT renal, UA, and labs.  Clinical Course as of Mar 08 2034  Nancy Fetter Mar 08, 2018  1827 CT showing multiple small layering ureteral stones causing mild left-sided hydronephrosis.  UA with no evidence of overlying infection.  Creatinine is at baseline.  CT also concerning for possible atypical pneumonia however patient has no cough, no shortness of breath, no chest pain, no fever, and normal white count to therefore we will hold  off treatment.  She has an appointment with her oncologist in 2 days and I will have her reassessed by the oncologist for any signs of infection.  Will start patient on p.o. pain medication and Flomax for possible discharge.   [CV]  1921 Patient received 4 mg of IV morphine, 5 mg of  p.o. oxycodone and of thousand milligrams of Tylenol +0.4 mg of Flomax.  She is still complaining of severe pain.  Will give another dose of IV morphine.  Due to history of chronic kidney injury will hold off Toradol.   [CV]    Clinical Course User Index [CV] Alfred Levins [place], MD   _________________________ 8:33 PM on 03/08/2018 -----------------------------------------  Pain is not well controlled.  Patient is good to be discharged home with standing Tylenol 1000 mg every 8 hours, oxycodone as needed for breakthrough pain, Zofran for nausea and Flomax.  She is being referred to urology.  Discussed return precautions for dysuria, fever, abdominal pain, or pain that is not well controlled at home.  Also discussed with the patient the need to discuss results of CT scanning with her oncologist on her upcoming appointment in 2 days.  Radiologist was concerned for possible atypical pneumonia.  Patient has no clinical symptoms of pneumonia, normal white count, afebrile, no cough or shortness of breath, no chest pain.  Since she has cancer this makes me concerned for possible complication of her cancer.  Patient will discuss that with her oncologist.  As part of my medical decision making, I reviewed the following data within the [facility] notes reviewed and incorporated, Labs reviewed , Old chart reviewed, Radiograph reviewed , Notes from prior ED visits and [place] Controlled Substance Database    Pertinent labs & imaging results that were available during my care of the patient were reviewed by me and considered in my medical decision making (see chart for  details).    ____________________________________________   FINAL CLINICAL IMPRESSION(S) / ED DIAGNOSES  Final diagnoses:  Kidney stone      NEW MEDICATIONS STARTED DURING THIS VISIT:  ED Discharge Orders        Ordered    acetaminophen (TYLENOL) 500 MG tablet  Every 8 hours     03/08/18 2031    oxyCODONE (ROXICODONE) 5 MG immediate release tablet  Every 8 hours PRN     03/08/18 2031    ondansetron (ZOFRAN ODT) 4 MG disintegrating tablet  Every 8 hours PRN     03/08/18 2031    tamsulosin (FLOMAX) 0.4 MG CAPS capsule  Daily     03/08/18 2031       Note:  This document was prepared using Dragon voice recognition software and may include unintentional dictation errors.    Rudene Re, MD 03/08/18 2036

## 2018-03-08 NOTE — Discharge Instructions (Addendum)
Pain control: Take tylenol 1000mg  every 8 hours. Take 5mg  of oxycodone every 6 hours for breakthrough pain. If you need the oxycodone make sure to take one senokot as well to prevent constipation.  Do not drink alcohol, drive or participate in any other potentially dangerous activities while taking this medication as it may make you sleepy. Do not take this medication with any other sedating medications, either prescription or over-the-counter.  Return to the ER if you have worsening pain, vomiting, fever, abdominal pain, or pain or burning with urination.  Otherwise follow-up with urology.  Remember to discuss with your oncologist the findings seen on your lungs on the CT scan.

## 2018-03-08 NOTE — ED Triage Notes (Signed)
Pt arrives to ED c/o of L sided flank pain, stating "i'm having a kidney stone attack." pain began this AM. States vomiting today.   Has leukemia, takes chemo pills.  Alert, oriented, ambulatory.

## 2018-03-09 ENCOUNTER — Ambulatory Visit: Admit: 2018-03-09 | Discharge: 2018-03-09 | Payer: MEDICARE

## 2018-03-09 ENCOUNTER — Encounter: Admit: 2018-03-09 | Discharge: 2018-03-09 | Payer: MEDICARE | Attending: Internal Medicine | Primary: Internal Medicine

## 2018-03-09 ENCOUNTER — Encounter: Admit: 2018-03-09 | Discharge: 2018-03-09 | Payer: MEDICARE

## 2018-03-09 DIAGNOSIS — J189 Pneumonia, unspecified organism: Principal | ICD-10-CM

## 2018-03-09 DIAGNOSIS — D46Z Other myelodysplastic syndromes: Secondary | ICD-10-CM

## 2018-03-09 DIAGNOSIS — C9200 Acute myeloblastic leukemia, not having achieved remission: Principal | ICD-10-CM

## 2018-03-09 DIAGNOSIS — K719 Toxic liver disease, unspecified: Secondary | ICD-10-CM

## 2018-03-09 DIAGNOSIS — Z9221 Personal history of antineoplastic chemotherapy: Secondary | ICD-10-CM | POA: Diagnosis not present

## 2018-03-09 DIAGNOSIS — J181 Lobar pneumonia, unspecified organism: Secondary | ICD-10-CM | POA: Diagnosis not present

## 2018-03-09 LAB — COMPREHENSIVE METABOLIC PANEL
ALBUMIN: 3.6 g/dL (ref 3.5–5.0)
ALKALINE PHOSPHATASE: 206 U/L — ABNORMAL HIGH (ref 38–126)
ANION GAP: 8 mmol/L — ABNORMAL LOW (ref 9–15)
BILIRUBIN TOTAL: 1.7 mg/dL — ABNORMAL HIGH (ref 0.0–1.2)
BLOOD UREA NITROGEN: 17 mg/dL (ref 7–21)
BUN / CREAT RATIO: 11
CALCIUM: 8.6 mg/dL (ref 8.5–10.2)
CHLORIDE: 109 mmol/L — ABNORMAL HIGH (ref 98–107)
CO2: 22 mmol/L (ref 22.0–30.0)
CREATININE: 1.51 mg/dL — ABNORMAL HIGH (ref 0.60–1.00)
EGFR CKD-EPI AA FEMALE: 38 mL/min/{1.73_m2} — ABNORMAL LOW (ref >=60)
EGFR CKD-EPI NON-AA FEMALE: 33 mL/min/{1.73_m2} — ABNORMAL LOW (ref >=60)
GLUCOSE RANDOM: 126 mg/dL (ref 65–179)
POTASSIUM: 4.1 mmol/L (ref 3.5–5.0)
PROTEIN TOTAL: 6.2 g/dL — ABNORMAL LOW (ref 6.5–8.3)
SODIUM: 139 mmol/L (ref 135–145)

## 2018-03-09 LAB — ALKALINE PHOSPHATASE: Alkaline phosphatase:CCnc:Pt:Ser/Plas:Qn:: 206 — ABNORMAL HIGH

## 2018-03-09 NOTE — Unmapped (Signed)
AOC Triage Note     Patient: Jessica Wilson     Reason for call: Patient status     Time call returned: 16:04     Phone Assessment: Spoke with sister Talbert Forest), she is concerned she has not received a call from her sister [name] and wanted to make sure that the patient was not still at [facility] or admitted.     Triage Recommendations: Spoke with Care Team, patient was seen and no tests were scheduled for today.  Relayed the above information to [place] (expressed appreciation).     Patient Response: N/A     Outstanding tasks: N/A

## 2018-03-09 NOTE — Unmapped (Signed)
[facility] Leukemia Clinic Follow-up    Patient Name: Jessica Wilson  Patient Age: 77 y.o.  Encounter Date: 03/09/2018    Primary Care Provider:  Kerman Passey, MD    Referring Physician:  Loyal Buba, MD  [address]  [address]  [place], [place] [address]    Reason for visit:  Follow up of MDS progressed to AML, on enasidenib    History of Present Illness:  We had the pleasure of seeing Jessica Wilson in the Leukemia Clinic at the [facility] [name] on 03/09/2018.  She is a 77 y.o. 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      Current therapy is enasidenib 100 mg PO daily    Her oncologic history is as follows:    Oncology History    Referring/Local Oncologist: Dr. Louretta Shorten, [facility]    Diagnosis: MDS with Excess Blasts-2    Genetics:   Karyotype/FISH: 46XX     Molecular Genetics: not performed    Pertinent Phenotypic data: no aberrant immunophenotype by flow cytometry, however blasts stained by IHC for CD117, MPO.  Only 5% marrow cells stained for CD34.    Disease-specific prognostic estimation: IPSS-R high risk, median OS 1.6 years, with 25% AML risk at 1.4 years            MDS (myelodysplastic syndrome), high grade (CMS-HCC)    04/17/2016 Initial Diagnosis     MDS (myelodysplastic syndrome), high grade (RAF-HCC)         04/29/2016 -  Chemotherapy     Azacitidine cycle 1: 75 mg/m2 [place] days 1-7 of 28-day cycles         05/27/2016 -  Chemotherapy     Azacitidine cycle 2: 75 mg/m2 [place] days 1-7 of 28 day cycles         06/24/2016 -  Chemotherapy     Azacitidine cycle 3: 75 mg/m2 [place] days 1-7 of 28 day cycle         07/22/2016 -  Chemotherapy     Azacitidine cycle 4: 75 mg/m2 [place] days 1-7 of 28 day cycle         08/19/2016 -  Chemotherapy     Azacitidine cycle 5: 75 mg/m2 [place] x 7 days of 28 day cycle         09/16/2016 -  Chemotherapy     Azacitidine cycle 6: 75 mbg/m2 [place] x 7 days of 28 day cycle         10/14/2016 -  Chemotherapy     Azacitidine cycle 7: 75 mg/m2 [place] x 7 days of 28 day cycle         12/09/2016 -  Chemotherapy     Azacitidine cycle 8: 60 mg/m2  x 7 days of 28 day (cycle reduced by 20% due to hematologic toxicity)         01/13/2017 -  Chemotherapy     Azacitidine cycle 9: 60 mg/m2  x 5 days of 28 day cycle (reduced by 2 days and 20% per dose due to hematologic toxicity)         05/29/2017 Progression     Given increasing transfusion requirements, repeat bone marrow biopsy done, now with 35% blasts and meets criteria for progression to AML.           Acute myeloid leukemia not having achieved remission (CMS-HCC)    06/02/2017 Initial Diagnosis     Acute myeloid leukemia  not having achieved remission         06/28/2017 - 07/17/2017 Chemotherapy     enasidenib 100 mg by mouth daily         07/17/2017 Adverse Reaction     Acute abdominal pain, splenomegaly, AKI , elevated transaminases.  Enasidenib held thru 11/13 and treated empirically for differentiation syndrome with dexamethasone. Resumed enasidenib 07/23/17         07/23/2017 -  Chemotherapy     enasidenib 100 mg PO daily          Interim History:  Since last seen here, [name] has continued on enasidenib.  She was seen recently in the [facility] emergency department for flank pain and was found to have recurrent nephrolithiasis.  Incidentally, she had an infiltrative pattern in the lower lung fields that were imaged by her renal stone protocol CT scan.  She denies any fevers, cough or shortness of breath.  She has also seen dentistry for gingival problems and tooth resorption.  She was placed on doxycycline and this prescription was recently refilled.  She is not sure when to stop the drug.  She has not needed recent RBC transfusions, but she does have easy bruising and received platelets prior to a dental procedure in May.  She asks about the safety of cataract surgery being planned.    Otherwise, she denies new constitutional symptoms such as anorexia, weight loss night sweats or unexplained fevers.  Furthermore, she denies symptoms of marrow failure: unexplained bleeding or bruising, recurrent or unexplained intercurrent infections, dyspnea on exertion, lightheadedness, palpitations or chest pain.  There have been no new or unexplained pains or self-identified masses, swelling or enlarged lymph nodes.    Past Medical, Surgical and Family History were reviewed and pertinent updates were made in the Electronic Medical Record    Review of Systems:  Other than as reported above in the interim history, all other systems reviewed were negative.    ECOG Performance Status: 2    Medications:    Current Outpatient Medications   Medication Sig Dispense Refill   ??? acetaminophen (TYLENOL) 325 MG tablet Take 650 mg by mouth every six (6) hours as needed for pain.     ??? acyclovir (ZOVIRAX) 400 MG tablet Take 400 mg by mouth Two (2) times a day.      ??? buPROPion (WELLBUTRIN SR) 150 MG 12 hr tablet 2 tabs po daily  1   ??? chlorhexidine (PERIDEX) 0.12 % solution 15 mL by Mouth route Two (2) times a day. 473 mL 4   ??? clonazePAM (KLONOPIN) 0.5 MG tablet Take 0.5 mg by mouth two (2) times a day as needed for anxiety.      ??? diltiazem (CARDIZEM CD) 180 MG 24 hr capsule Take 180 mg by mouth daily.     ??? enasidenib (IDHIFA) 100 mg tablet Take 1 tablet (100 mg total) by mouth daily. 30 tablet 6   ??? lidocaine-prilocaine (EMLA) cream   1   ??? prochlorperazine (COMPAZINE) 10 MG tablet Take 10 mg by mouth every six (6) hours as needed for nausea.     ??? sertraline (ZOLOFT) 100 MG tablet Take 150 mg by mouth daily.      ??? umeclidinium-vilanterol (ANORO ELLIPTA) 62.5-25 mcg/actuation inhaler Inhale 1 puff daily.     ??? doxycycline (PERIOSTAT) 20 MG tablet Take 1 tablet (20 mg total) by mouth Two (2) times a day. 60 tablet 0   ??? tamsulosin (FLOMAX) 0.4 mg capsule Take 1  capsule (0.4 mg total) by mouth daily. (Patient not taking: Reported on 03/09/2018) 10 capsule 0     No current facility-administered medications for this visit. Vital Signs:  Vitals:    03/09/18 1213   BP: 149/68   Pulse: 85   Resp: 20   Temp: 36.9 ??C (98.5 ??F)   SpO2: 95%     Physical Exam:  General: In NAD  HEENT: Stable disconjugate gaze. No scleral icterus or conjunctival injection. Nares show no bleeding.    Lymph node exam: Deferred  Heart:  Regular rate and rhythm. S1, S2. No murmurs, gallops or rubs.  Lungs:  Breathing is unlabored and patient is speaking full sentences with ease.  No stridor.  Auscultation of lung fields reveals normal air movement without rales, ronchi or crackles.    GI: No distention or pain on palpation.  Bowel sounds are present and normal in quality.  No palpable hepatomegaly or splenomegaly.  No palpable masses.  Skin:  No rashes, petechiae or purpura.  No areas of skin breakdown.  Musculoskeletal:  No grossly-evident joint effusions or deformities.  Range of motion about the shoulder, elbow, hips and knees is grossly normal.   Psychiatric:  Alert and oriented to person, place, time and situation.  Range of affect is appropriate.    Neurologic:    Gait is normal.  Cerebellar tasks are completed with ease and are symmetric.  Extremities:  Appear well-perfused. No clubbing, but there is 1+ bilateral edema.  No cyanosis.    Relevant Laboratory, radiology and pathology results:  I personally viewed the most recent internal records and labs and discussed the available results with the patient or family.  A summary of results follows:  Lab on 03/09/2018   Component Date Value Ref Range Status   ??? ABO Grouping 03/09/2018 A POS   Final   ??? Antibody Screen 03/09/2018 NEG   Final   ??? Sodium 03/09/2018 139  135 - 145 mmol/L Final   ??? Potassium 03/09/2018 4.1  3.5 - 5.0 mmol/L Final   ??? Chloride 03/09/2018 109* 98 - 107 mmol/L Final   ??? CO2 03/09/2018 22.0  22.0 - 30.0 mmol/L Final   ??? Anion Gap 03/09/2018 8* 9 - 15 mmol/L Final   ??? BUN 03/09/2018 17  7 - 21 mg/dL Final   ??? Creatinine 03/09/2018 1.51* 0.60 - 1.00 mg/dL Final   ??? BUN/Creatinine Ratio 03/09/2018 11   Final   ??? EGFR CKD-EPI Non-African American,* 03/09/2018 33* >=60 mL/min/1.93m2 Final   ??? EGFR CKD-EPI African American, Fem* 03/09/2018 38* >=60 mL/min/1.52m2 Final   ??? Glucose 03/09/2018 126  65 - 179 mg/dL Final   ??? Calcium 16/06/9603 8.6  8.5 - 10.2 mg/dL Final   ??? Albumin 54/05/8118 3.6  3.5 - 5.0 g/dL Final   ??? Total Protein 03/09/2018 6.2* 6.5 - 8.3 g/dL Final   ??? Total Bilirubin 03/09/2018 1.7* 0.0 - 1.2 mg/dL Final   ??? AST 14/78/2956 534* 14 - 38 U/L Final   ??? ALT 03/09/2018 523* 15 - 48 U/L Final   ??? Alkaline Phosphatase 03/09/2018 206* 38 - 126 U/L Final   ??? WBC 03/09/2018 3.1* 4.5 - 11.0 10*9/L Final   ??? RBC 03/09/2018 2.81* 4.00 - 5.20 10*12/L Final   ??? HGB 03/09/2018 9.3* 12.0 - 16.0 g/dL Final   ??? HCT 21/30/8657 28.5* 36.0 - 46.0 % Final   ??? MCV 03/09/2018 101.4* 80.0 - 100.0 fL Final   ??? MCH 03/09/2018 33.0  26.0 -  34.0 pg Final   ??? MCHC 03/09/2018 32.6  31.0 - 37.0 g/dL Final   ??? RDW 16/06/9603 18.6* 12.0 - 15.0 % Final   ??? MPV 03/09/2018 8.2  7.0 - 10.0 fL Final   ??? Platelet 03/09/2018 28* 150 - 440 10*9/L Final   ??? Variable HGB Concentration 03/09/2018 Slight* Not Present Final   ??? Neutrophil Left Shift 03/09/2018 3+* Not Present Final   ??? Microcytosis 03/09/2018 Slight* Not Present Final   ??? Macrocytosis 03/09/2018 Marked* Not Present Final   ??? Anisocytosis 03/09/2018 Moderate* Not Present Final   ??? Hypochromasia 03/09/2018 Marked* Not Present Final   ??? Pathologist Smear Interpretation 03/09/2018 Confirmed by Hemepath Specialist/Senior Tech   Final   ??? Neutrophils % 03/09/2018 44  % Final   ??? Lymphocytes % 03/09/2018 19  % Final   ??? Monocytes % 03/09/2018 17  % Final   ??? Eosinophils % 03/09/2018 1  % Final   ??? Basophils % 03/09/2018 2  % Final   ??? Blasts % 03/09/2018 17* <=0 % Final   ??? Absolute Neutrophils 03/09/2018 1.4* 2.0 - 7.5 10*9/L Final   ??? Absolute Lymphocytes 03/09/2018 0.6* 1.5 - 5.0 10*9/L Final   ??? Absolute Monocytes 03/09/2018 0.5  0.2 - 0.8 10*9/L Final   ??? Absolute Eosinophils 03/09/2018 0.0  0.0 - 0.4 10*9/L Final   ??? Absolute Basophils 03/09/2018 0.1  0.0 - 0.1 10*9/L Final   ??? Smear Review Comments 03/09/2018 See Comment* Undefined Final    Slide reviewed Blasts Present. Myelocytes present.      Assessment:  [name] is a 77 y.o. year old female with AML progressed from previous Myelodysplastic Syndrome s/p 12 cycles of azacitidine prior to progression to AML. She initially had hematologic improvement but had eventual progression requiring more transfusions and with 38% blasts in the bone marrow. She was started on enasidenib on 06/28/17, and had clearance of peripheral blood blasts, avoidance of severe neutropenia and improvement in her hemoglobin and thrombocytopenia.    Today, [name] has reappearance of circulating blasts and transaminase elevations.  Blasts are likely due to regression of disease, though she is still maintaining palliative benefit from the anesthetic.  I am worried that that might not last for a long, and I will reach out to Dr. Donneta Romberg for frequent follow up, and see her back in 2 weeks myself.  Given her comorbidities and limited performance status, it is unlikely that a clinical trial would be appropriate for her.  We could discuss low-dose cytarabine and Venetoclax, which of course was approved for newly diagnosed patients, not relapsed patients.  Because best supportive care may be the best option for her, I am not in hurry to take her off of Enasidinib if she is feeling relatively well.    Transaminase elevations could be due to progression of disease as well, but correlate temporally with doxycycline, which can cause liver injury.  Unfortunately, we will have to hold the enasidenib and doxycycline for the time being due to the liver injury.      Plan and Recommendations:  **AML, secondary after MDS  -Hold enasidenib due to grade 3 liver injury.  I would be amenable to re-challenging her at 100 mg daily if she has abrupt recovery of transaminase values to grade 1 or less.  This would be under the assumption that doxycycline was the culprit for the liver injury.  Otherwise, we would consider resuming at a lower dose, 50 mg/day per the  package insert for Enasidinib.      - She is likely through the window of risk from differentiation syndrome from this therapy.  Nonetheless, if she develops fever, dyspnea, hypoxia or other end organ issues, it would be reasonable to entertain the diagnosis of differentiation syndrome.    **Transaminase elevations: As noted above, I suspect this is drug-induced liver injury.  We will hold doxycycline and enasidenib.  Follow-up early next week with Dr.Brahmanday is already scheduled and I will see if he can recheck the liver tests at that time.  If things are not resolving, then we may need to add some viral studies and liver imaging.  I will see her the following week. I will discuss the indication for antibiotics with her dentist.    Supportive Care Recommendations:  We recommend based on the patient???s underlying diagnosis and treatment history the following supportive care:      1. Antimicrobial prophylaxis:  Other: seasonal and age appropriate vaccines.  Valacyclovir for shinglse/HSV prophylaxis    2. Blood product support:  Leukoreduced blood products are required.  Irradiated blood products are preferred, but in case of urgent transfusion needs non-irradiated blood products may be used:     -  RBC transfusion threshold: transfuse 2 units for Hgb < 8 g/dL.  -  Platelet transfusion threshold: transfuse 1 unit of platelets for platelet count < 10, or for bleeding or need for invasive procedure.    3. Hematopoietic growth factor support: none    RTC 2 weeks.          Troy Sine Malen Gauze, MD  Leukemia Program  Division of Hematology/Oncology  [facility]    Nurse Navigator (non-clinical trial patients): Gwyneth Sprout, RN        Tel. [phone]       Fax. [phone] Toll-free appointments: [phone]  Scheduling assistance: [phone]  After hours/weekends: [phone] (ask for adult hematology/oncology on-call)

## 2018-03-09 NOTE — Unmapped (Signed)
Labs drawn and sent for analysis by Eulogio Ditch, RN

## 2018-03-09 NOTE — Unmapped (Addendum)
I think you are still doing well on enasidenib.  However, your liver tests are very high    These changes may be due to your antibiotic, since that is the new drug.    Stop the doxycycline.  Stop the enasidenib for now.  Stop the acyclovir.    I will have Dr. Donneta Romberg check liver tests Friday.    We will do CT of your lungs and liver ultrasound and see you back here in 2 weeks.    Lab on 03/09/2018   Component Date Value Ref Range Status   ??? ABO Grouping 03/09/2018 A POS   Final   ??? Antibody Screen 03/09/2018 NEG   Final   ??? Sodium 03/09/2018 139  135 - 145 mmol/L Final   ??? Potassium 03/09/2018 4.1  3.5 - 5.0 mmol/L Final   ??? Chloride 03/09/2018 109* 98 - 107 mmol/L Final   ??? CO2 03/09/2018 22.0  22.0 - 30.0 mmol/L Final   ??? Anion Gap 03/09/2018 8* 9 - 15 mmol/L Final   ??? BUN 03/09/2018 17  7 - 21 mg/dL Final   ??? Creatinine 03/09/2018 1.51* 0.60 - 1.00 mg/dL Final   ??? BUN/Creatinine Ratio 03/09/2018 11   Final   ??? EGFR CKD-EPI Non-African American,* 03/09/2018 33* >=60 mL/min/1.79m2 Final   ??? EGFR CKD-EPI African American, Fem* 03/09/2018 38* >=60 mL/min/1.73m2 Final   ??? Glucose 03/09/2018 126  65 - 179 mg/dL Final   ??? Calcium 09/81/1914 8.6  8.5 - 10.2 mg/dL Final   ??? Albumin 78/29/5621 3.6  3.5 - 5.0 g/dL Final   ??? Total Protein 03/09/2018 6.2* 6.5 - 8.3 g/dL Final   ??? Total Bilirubin 03/09/2018 1.7* 0.0 - 1.2 mg/dL Final   ??? AST 30/86/5784 534* 14 - 38 U/L Final   ??? ALT 03/09/2018 523* 15 - 48 U/L Final   ??? Alkaline Phosphatase 03/09/2018 206* 38 - 126 U/L Final   ??? WBC 03/09/2018   4.5 - 11.0 10*9/L Preliminary    Pending.   ??? RBC 03/09/2018 2.81* 4.00 - 5.20 10*12/L Preliminary   ??? HGB 03/09/2018 9.3* 12.0 - 16.0 g/dL Preliminary   ??? HCT 03/09/2018 28.5* 36.0 - 46.0 % Preliminary   ??? MCV 03/09/2018 101.4* 80.0 - 100.0 fL Preliminary   ??? MCH 03/09/2018 33.0  26.0 - 34.0 pg Preliminary   ??? MCHC 03/09/2018 32.6  31.0 - 37.0 g/dL Preliminary   ??? RDW 03/09/2018 18.6* 12.0 - 15.0 % Preliminary   ??? MPV 03/09/2018 8.2  7.0 - 10.0 fL Preliminary   ??? Platelet 03/09/2018 28* 150 - 440 10*9/L Preliminary   ??? Variable HGB Concentration 03/09/2018 Slight* Not Present Preliminary   ??? Neutrophil Left Shift 03/09/2018 3+* Not Present Preliminary   ??? Microcytosis 03/09/2018 Slight* Not Present Preliminary   ??? Macrocytosis 03/09/2018 Marked* Not Present Preliminary   ??? Anisocytosis 03/09/2018 Moderate* Not Present Preliminary   ??? Hypochromasia 03/09/2018 Marked* Not Present Preliminary

## 2018-03-09 NOTE — Unmapped (Signed)
Lu Duffel, sister of patient contacted the Communication Center regarding the following:    Called to speak with the care team about status patient.    Please contact Talbert Forest at [phone].    Thanks in advance,    Drema Balzarine  [facility]   [phone]

## 2018-03-10 LAB — CBC W/ AUTO DIFF
HEMATOCRIT: 28.5 % — ABNORMAL LOW (ref 36.0–46.0)
MEAN CORPUSCULAR HEMOGLOBIN CONC: 32.6 g/dL (ref 31.0–37.0)
MEAN CORPUSCULAR HEMOGLOBIN: 33 pg (ref 26.0–34.0)
MEAN CORPUSCULAR VOLUME: 101.4 fL — ABNORMAL HIGH (ref 80.0–100.0)
MEAN PLATELET VOLUME: 8.2 fL (ref 7.0–10.0)
PLATELET COUNT: 28 10*9/L — ABNORMAL LOW (ref 150–440)
RED BLOOD CELL COUNT: 2.81 10*12/L — ABNORMAL LOW (ref 4.00–5.20)
RED CELL DISTRIBUTION WIDTH: 18.6 % — ABNORMAL HIGH (ref 12.0–15.0)

## 2018-03-10 LAB — MANUAL DIFFERENTIAL
BASOPHILS - ABS (DIFF): 0.1 10*9/L (ref 0.0–0.1)
BASOPHILS - REL (DIFF): 2 %
BLASTS - REL (DIFF): 17 % (ref <=0)
EOSINOPHILS - ABS (DIFF): 0 10*9/L (ref 0.0–0.4)
EOSINOPHILS - REL (DIFF): 1 %
LYMPHOCYTES - ABS (DIFF): 0.6 10*9/L — ABNORMAL LOW (ref 1.5–5.0)
LYMPHOCYTES - REL (DIFF): 19 %
MONOCYTES - ABS (DIFF): 0.5 10*9/L (ref 0.2–0.8)
MONOCYTES - REL (DIFF): 17 %
NEUTROPHILS - ABS (DIFF): 1.4 10*9/L — ABNORMAL LOW (ref 2.0–7.5)

## 2018-03-10 LAB — MACROCYTES

## 2018-03-10 LAB — PATHOLOGIST SMEAR INTERPRETATION

## 2018-03-10 LAB — BASOPHILS - REL (DIFF): Lab: 2

## 2018-03-10 NOTE — Progress Notes (Signed)
03/11/2018 12:57 PM   Teresa Carrillo 17-Feb-1941 [id number]  Referring provider: Arnetha Courser, MD [address] [place], [place] [address]  No chief complaint on file.   HPI: Patient is a 77 year old Caucasian female who is referred by [facility] ED for a left ureteral stone.  She presented to the ED on 03/08/2018 with the complaint of left flank pain that radiated to the left lower quadrant associated with vomiting.  CT renal stone study noted multiple small layering calculi at the left UVJ with resultant mild left hydroureteronephrosis.  Multiple new bilateral renal calculi measuring up to 1.3 cm.  Diffuse perilymphatic and peribronchovascular flame shaped nodular opacities at the lung bases, suspicious for atypical infection given currently on chemotherapy.  Stable to slightly improved splenomegaly. Decreased epigastric lymph node, now measuring 10 mm in short axis, previously 16 mm.  Interval enlargement of the nonspecific left anterior upper abdominal wall soft tissue nodule, now measuring 2.1 x 2.4 cm, previously 2.1 x 1.7 cm.  Aortic atherosclerosis   Her creatinine was 1.29.  Her WBC was 4.6.  Her UA was positive for > 50 RBC's.  She was given Tylenol, Zofran, Oxycodone and Flomax.  Today, she is having gross hematuria and she feels short of breath.  She is having bilateral flank pain.  She states that she just feels miserable.  She has been having nausea and vomiting.  Patient denies any dysuria or suprapubic/flank pain.  Patient denies any fevers, chills, nausea or vomiting.   Her UA today is positive for >30 RBC's, uric acid crystal and moderate bacteria.     PMH: Past Medical History:  Diagnosis Date  . Abdominal wall mass   . Anginal pain ([place])   . Anxiety   . Arthritis   . Calculus of kidney   . Cystitis   . Depression   . Diabetes mellitus without complication (HCC)    elevated A1c  . Dyspnea on exertion   . Elevated serum creatinine   . Fibrocystic  breast disease   . GERD (gastroesophageal reflux disease)   . Hearing loss   . Heart murmur   . HTN (hypertension)   . Hyperlipidemia   . Kidney stones   . MDS (myelodysplastic syndrome), high grade ([place]) 04/23/2016  . Microscopic hematuria   . Mouth sores   . Mucositis due to chemotherapy   . Obesity   . Obesity, morbid ([place]) 09/24/2017   BMI > 35 with type 2 diabetes  . Pneumonia   . Risk for falls   . Sleep apnea   . Thrombocytopenia ([place])   . Urinary frequency   . Urinary urgency     Surgical History: Past Surgical History:  Procedure Laterality Date  . ABDOMINAL HYSTERECTOMY    . APPENDECTOMY    . CARDIAC CATHETERIZATION     x2  . CHOLECYSTECTOMY    . COLONOSCOPY N/A 02/24/2015   Procedure: COLONOSCOPY;  Surgeon: Manya Silvas, MD;  Location: [facility] ENDOSCOPY;  Service: Endoscopy;  Laterality: N/A;  . DIAGNOSTIC LAPAROSCOPY     Removal of benign abdominal tumor  . ESOPHAGOGASTRODUODENOSCOPY N/A 02/24/2015   Procedure: ESOPHAGOGASTRODUODENOSCOPY (EGD);  Surgeon: Manya Silvas, MD;  Location: [facility] ENDOSCOPY;  Service: Endoscopy;  Laterality: N/A;  . IR IMAGING GUIDED PORT INSERTION  10/09/2017  . right eye surgery Right     Home Medications:  Allergies as of 03/11/2018      Reactions   Macrobid [nitrofurantoin Monohyd Macro] Other (See  Comments)   Reaction: unknown      Medication List        Accurate as of 03/11/18 12:57 PM. Always use your most recent med list.          ANORO ELLIPTA 62.5-25 MCG/INH Aepb Generic drug:  umeclidinium-vilanterol Inhale 1 puff into the lungs as needed.   buPROPion 150 MG 24 hr tablet Commonly known as:  WELLBUTRIN XL Take 300 mg by mouth daily at 12 noon.   chlorhexidine 0.12 % solution Commonly known as:  PERIDEX 15 mLs by Mouth Rinse route 2 (two) times daily.   clonazePAM 0.5 MG tablet Commonly known as:  KLONOPIN Take 0.5 mg by mouth 2 (two) times daily.   diltiazem 180 MG 24 hr capsule Commonly known as:   CARDIZEM CD Take 1 capsule (180 mg total) by mouth daily.   lidocaine-prilocaine cream Commonly known as:  EMLA Apply 1 application topically as needed. Apply small amount to port site at least 1 hour prior to it being accessed, cover with plastic wrap   ondansetron 4 MG disintegrating tablet Commonly known as:  ZOFRAN ODT Take 1 tablet (4 mg total) by mouth every 8 (eight) hours as needed for nausea or vomiting.   oxyCODONE 5 MG immediate release tablet Commonly known as:  ROXICODONE Take 1 tablet (5 mg total) by mouth every 8 (eight) hours as needed.   sertraline 100 MG tablet Commonly known as:  ZOLOFT Take 100 mg by mouth daily at 12 noon.   tamsulosin 0.4 MG Caps capsule Commonly known as:  FLOMAX Take 1 capsule (0.4 mg total) by mouth daily for 7 days.       Allergies:  Allergies  Allergen Reactions  . Macrobid [employer] Macro] Other (See Comments)    Reaction: unknown    Family History: Family History  Problem Relation Age of Onset  . Congestive Heart Failure Mother   . Diabetes Mother   . Coronary artery disease Mother   . Stroke Mother   . Cirrhosis Father   . Diabetes Brother     Social History:  reports that she quit smoking about 29 years ago. Her smoking use included cigarettes. She has never used smokeless tobacco. She reports that she does not drink alcohol or use drugs.  ROS: UROLOGY Frequent Urination?: No Hard to postpone urination?: No Burning/pain with urination?: No Get up at night to urinate?: No Leakage of urine?: No Urine stream starts and stops?: No Trouble starting stream?: No Do you have to strain to urinate?: No Blood in urine?: Yes Urinary tract infection?: No Sexually transmitted disease?: No Injury to kidneys or bladder?: No Painful intercourse?: No Weak stream?: No Currently pregnant?: No Vaginal bleeding?: No Last menstrual period?: n  Gastrointestinal Nausea?: No Vomiting?: No Indigestion/heartburn?:  No Diarrhea?: No Constipation?: No  Constitutional Fever: No Night sweats?: No Weight loss?: No Fatigue?: Yes  Skin Skin rash/lesions?: No Itching?: No  Eyes Blurred vision?: No Double vision?: No  Ears/Nose/Throat Sore throat?: Yes Sinus problems?: No  Hematologic/Lymphatic Swollen glands?: No Easy bruising?: No  Cardiovascular Leg swelling?: No Chest pain?: No  Respiratory Cough?: No Shortness of breath?: Yes  Endocrine Excessive thirst?: No  Musculoskeletal Back pain?: Yes Joint pain?: No  Neurological Headaches?: No Dizziness?: No  Psychologic Depression?: No Anxiety?: No  Physical Exam: BP (!) 156/74 (BP Location: Right Arm, Patient Position: Sitting, Cuff Size: Large)   Pulse 97   Ht 5\' 3"  (1.6 m)   Wt 233 lb 9.6  oz (106 kg)   BMI 41.38 kg/m   Constitutional:  Well nourished. Alert and oriented, No acute distress. HEENT: [place] AT, moist mucus membranes.  Trachea midline, no masses. Cardiovascular: No clubbing, cyanosis, or edema. Respiratory: Labored respiratory effort, increased work of breathing. GI: Abdomen is soft, non tender, non distended, no abdominal masses. Liver and spleen not palpable.  No hernias appreciated.  Stool sample for occult testing is not indicated.  Normal external genitalia, normal pubic hair distribution, no lesions.  Normal urethral meatus, no lesions, no prolapse, no discharge.   No urethral masses, tenderness and/or tenderness. No bladder fullness, tenderness or masses. Right labial abscess 5 cm x 3 cm.  Atrophic vagina mucosa, good estrogen effect, no discharge, no lesions, good pelvic support, no cystocele or rectocele noted.  Cervix and uterus are surgically absent.  No adnexal/parametria masses or tenderness noted.  Anus and perineum are without rashes or lesions.    GU: No CVA tenderness.  No bladder fullness or masses.   Skin: No rashes, bruises or suspicious lesions. Lymph: No cervical or inguinal  adenopathy. Neurologic: Grossly intact, no focal deficits, moving all 4 extremities. Psychiatric: Normal mood and affect.  Laboratory Data: Lab Results  Component Value Date   WBC 4.6 03/08/2018   HGB 10.2 (L) 03/08/2018   HCT 29.9 (L) 03/08/2018   MCV 99.8 03/08/2018   PLT 54 (L) 03/08/2018    Lab Results  Component Value Date   CREATININE 1.29 (H) 03/08/2018    No results found for: PSA  No results found for: TESTOSTERONE  Lab Results  Component Value Date   HGBA1C 4.9 12/24/2017    Lab Results  Component Value Date   TSH 3.289 03/27/2017       Component Value Date/Time   CHOL 185 12/24/2017 1059   CHOL 137 01/04/2016 0922   CHOL 167 11/10/2012 0435   HDL 32 (L) 12/24/2017 1059   HDL 42 01/04/2016 0922   HDL 40 11/10/2012 0435   CHOLHDL 5.8 (H) 12/24/2017 1059   VLDL 36 (H) 01/23/2017 0944   VLDL 33 11/10/2012 0435   LDLCALC 112 (H) 12/24/2017 1059   LDLCALC 94 11/10/2012 0435    Lab Results  Component Value Date   AST 18 02/12/2018   Lab Results  Component Value Date   ALT 10 (L) 02/12/2018   No components found for: ALKALINEPHOPHATASE No components found for: BILIRUBINTOTAL  No results found for: ESTRADIOL  Urinalysis    Component Value Date/Time   COLORURINE YELLOW (A) 03/08/2018 1620   APPEARANCEUR HAZY (A) 03/08/2018 1620   APPEARANCEUR Clear 05/08/2015 1446   LABSPEC 1.010 03/08/2018 1620   PHURINE 5.0 03/08/2018 1620   GLUCOSEU NEGATIVE 03/08/2018 1620   HGBUR LARGE (A) 03/08/2018 1620   BILIRUBINUR NEGATIVE 03/08/2018 1620   BILIRUBINUR neg 12/24/2017 1043   BILIRUBINUR Negative 05/08/2015 1446   KETONESUR NEGATIVE 03/08/2018 1620   PROTEINUR 100 (A) 03/08/2018 1620   UROBILINOGEN 0.2 12/24/2017 1043   NITRITE NEGATIVE 03/08/2018 1620   LEUKOCYTESUR NEGATIVE 03/08/2018 1620   LEUKOCYTESUR Trace (A) 05/08/2015 1446    I have reviewed the labs.   Pertinent Imaging: CLINICAL DATA:  Left flank pain. History of kidney stones.  Currently on chemotherapy for leukemia.  EXAM: CT ABDOMEN AND PELVIS WITHOUT CONTRAST  TECHNIQUE: Multidetector CT imaging of the abdomen and pelvis was performed following the standard protocol without IV contrast.  COMPARISON:  CT abdomen pelvis dated July 17, 2017.  FINDINGS: Lower chest: Diffuse perilymphatic and  peribronchovascular flame shaped nodular opacities at the lung bases.  Hepatobiliary: Scattered calcified granulomas within the liver are unchanged. No new focal liver abnormality. Status post cholecystectomy. Mild common bile duct and intrahepatic biliary dilatation is similar to prior study.  Pancreas: Unremarkable. No pancreatic ductal dilatation or surrounding inflammatory changes.  Spleen: Stable to slight interval decrease in size of the spleen, now measuring 13.1 x 7.3 x 13.8 cm (volume of 659 cc, previously 710 cc). Multiple splenic granulomas are unchanged.  Adrenals/Urinary Tract: The adrenal glands are unremarkable. Stable bilateral renal cysts. There are multiple new bilateral renal calculi measuring up to 1.3 cm. There are multiple small layering calculi at the left UVJ with resultant mild left hydroureteronephrosis. The bladder is unremarkable.  Stomach/Bowel: Small hiatal hernia, unchanged. The stomach is otherwise within normal limits. No bowel wall thickening, distention, or surrounding inflammatory changes. Prior appendectomy.  Vascular/Lymphatic: Aortic atherosclerosis. Interval decrease in size of the lymph node at the diaphragmatic hiatus, now measuring 10 mm in short axis, previously 1.6 cm. Scattered subcentimeter retroperitoneal mesenteric lymph nodes are unchanged.  Reproductive: Status post hysterectomy. No adnexal masses.  Other: Interval enlargement of the nonspecific left anterior upper abdominal wall soft tissue nodule now measuring 2.1 x 2.4 cm, previously 2.1 x 1.7 cm. No abdominopelvic ascites.  No pneumoperitoneum.  Musculoskeletal: No acute or significant osseous findings.  IMPRESSION: 1. Multiple small layering calculi at the left UVJ with resultant mild left hydroureteronephrosis. 2. Multiple new bilateral renal calculi measuring up to 1.3 cm. 3. Diffuse perilymphatic and peribronchovascular flame shaped nodular opacities at the lung bases, suspicious for atypical infection given currently on chemotherapy. 4. Stable to slightly improved splenomegaly. 5. Decreased epigastric lymph node, now measuring 10 mm in short axis, previously 16 mm. 6. Interval enlargement of the nonspecific left anterior upper abdominal wall soft tissue nodule, now measuring 2.1 x 2.4 cm, previously 2.1 x 1.7 cm. 7.  Aortic atherosclerosis (ICD10-I70.0).   Electronically Signed   By: Titus Dubin M.D.   On: 03/08/2018 17:47  I have independently reviewed the films.    Assessment & Plan:    1. Left ureteral stones schedule left ureteroscopy with laser lithotripsy and ureteral stent placement explained to the patient how the procedure is performed and the risks involved informed patient that they will have a stent placed during the procedure and will remain in place after the procedure for a short time.  stent may be removed in the office with a cystoscope or patient may be instructed to remove the stent themselves by the string described "stent pain" as feelings of needing to urinate/overactive bladder and a warm, tingling sensation to intense pain in the affected flank residual stones within the kidney or ureter may be present after the procedure and may need to have these addressed at a different encounter injury to the ureter is the most common intra-operative risk, it may result in an open procedure to correct the defect infection and bleeding are also risks explained the risks of general anesthesia, such as: MI, CVA, paralysis, coma and/or death. advised to contact our office or  seek treatment in the ED if becomes febrile or pain/ vomiting are difficult control in order to arrange for emergent/urgent intervention  2. Bilateral renal stones Explained that her left renal stones may not be able to be treated during the URS as they are quite numerous and large and they will need to be approached in a staged manner Her right stones will not be treated at  this time unless one of them migrates into the ureter and causes obstruction  3. Left hydronephrosis obtain RUS to ensure the hydronephrosis has resolved once they have passed and/or recovered from procedure to ensure to iatrogenic hydronephrosis remains - it is explained to the patient that it is important to document resolution of the hydronephrosis as "silent hydronephrosis" can occur and cause damage and/or loss of the kidney  3. Gross hematuria UA today demonstrates > 30 RBC's continue to monitor the patient's UA after the treatment/passage of the stone to ensure the hematuria has resolved if hematuria persists, we will pursue a hematuria workup with CT Urogram and cystoscopy if appropriate.  4. SOB/Findings suspicious for pneumonia on CT Patient will be seeing Suezanne Cheshire, AGNP-DNP this afternoon  5. Labial abscess Patient will be referred to gynecology   6. Changes on CT Will forward results to Dr. Rogue Bussing   Return for left URS/LL/ureteral stent placement .  These notes generated with voice recognition software. I apologize for typographical errors.  Zara Council, PA-C  [facility] [address]  [place], [place] [address] [phone]

## 2018-03-11 ENCOUNTER — Emergency Department: Payer: Medicare Other

## 2018-03-11 ENCOUNTER — Encounter: Payer: Self-pay | Admitting: Nurse Practitioner

## 2018-03-11 ENCOUNTER — Inpatient Hospital Stay
Admission: EM | Admit: 2018-03-11 | Discharge: 2018-03-15 | DRG: 189 | Disposition: A | Discharge status: Other Acute Inpatient Hospital | Payer: Medicare Other | Attending: Internal Medicine | Admitting: Internal Medicine

## 2018-03-11 ENCOUNTER — Ambulatory Visit: Payer: Medicare Other | Admitting: Urology

## 2018-03-11 ENCOUNTER — Other Ambulatory Visit: Payer: Self-pay

## 2018-03-11 ENCOUNTER — Ambulatory Visit (INDEPENDENT_AMBULATORY_CARE_PROVIDER_SITE_OTHER): Payer: Medicare Other | Admitting: Nurse Practitioner

## 2018-03-11 ENCOUNTER — Encounter: Payer: Self-pay | Admitting: Emergency Medicine

## 2018-03-11 ENCOUNTER — Encounter: Payer: Self-pay | Admitting: Urology

## 2018-03-11 VITALS — BP 160/60 | HR 89 | Temp 98.3°F | Resp 24 | Ht 63.0 in | Wt 233.5 lb

## 2018-03-11 VITALS — BP 156/74 | HR 97 | Ht 63.0 in | Wt 233.6 lb

## 2018-03-11 DIAGNOSIS — N200 Calculus of kidney: Secondary | ICD-10-CM | POA: Diagnosis not present

## 2018-03-11 DIAGNOSIS — Z9049 Acquired absence of other specified parts of digestive tract: Secondary | ICD-10-CM | POA: Diagnosis not present

## 2018-03-11 DIAGNOSIS — N764 Abscess of vulva: Secondary | ICD-10-CM

## 2018-03-11 DIAGNOSIS — Z79899 Other long term (current) drug therapy: Secondary | ICD-10-CM | POA: Diagnosis not present

## 2018-03-11 DIAGNOSIS — R935 Abnormal findings on diagnostic imaging of other abdominal regions, including retroperitoneum: Secondary | ICD-10-CM | POA: Diagnosis not present

## 2018-03-11 DIAGNOSIS — M549 Dorsalgia, unspecified: Secondary | ICD-10-CM | POA: Diagnosis not present

## 2018-03-11 DIAGNOSIS — R1032 Left lower quadrant pain: Secondary | ICD-10-CM | POA: Diagnosis not present

## 2018-03-11 DIAGNOSIS — N132 Hydronephrosis with renal and ureteral calculous obstruction: Secondary | ICD-10-CM | POA: Diagnosis not present

## 2018-03-11 DIAGNOSIS — E119 Type 2 diabetes mellitus without complications: Secondary | ICD-10-CM | POA: Diagnosis not present

## 2018-03-11 DIAGNOSIS — Z833 Family history of diabetes mellitus: Secondary | ICD-10-CM | POA: Diagnosis not present

## 2018-03-11 DIAGNOSIS — R0600 Dyspnea, unspecified: Secondary | ICD-10-CM

## 2018-03-11 DIAGNOSIS — J9601 Acute respiratory failure with hypoxia: Principal | ICD-10-CM | POA: Diagnosis present

## 2018-03-11 DIAGNOSIS — Z87891 Personal history of nicotine dependence: Secondary | ICD-10-CM

## 2018-03-11 DIAGNOSIS — R9389 Abnormal findings on diagnostic imaging of other specified body structures: Secondary | ICD-10-CM

## 2018-03-11 DIAGNOSIS — I10 Essential (primary) hypertension: Secondary | ICD-10-CM | POA: Diagnosis not present

## 2018-03-11 DIAGNOSIS — D630 Anemia in neoplastic disease: Secondary | ICD-10-CM | POA: Diagnosis present

## 2018-03-11 DIAGNOSIS — E1122 Type 2 diabetes mellitus with diabetic chronic kidney disease: Secondary | ICD-10-CM | POA: Diagnosis present

## 2018-03-11 DIAGNOSIS — Z66 Do not resuscitate: Secondary | ICD-10-CM | POA: Diagnosis present

## 2018-03-11 DIAGNOSIS — R918 Other nonspecific abnormal finding of lung field: Secondary | ICD-10-CM | POA: Diagnosis not present

## 2018-03-11 DIAGNOSIS — Z881 Allergy status to other antibiotic agents status: Secondary | ICD-10-CM

## 2018-03-11 DIAGNOSIS — Z6841 Body Mass Index (BMI) 40.0 and over, adult: Secondary | ICD-10-CM

## 2018-03-11 DIAGNOSIS — R319 Hematuria, unspecified: Secondary | ICD-10-CM | POA: Diagnosis not present

## 2018-03-11 DIAGNOSIS — N183 Chronic kidney disease, stage 3 (moderate): Secondary | ICD-10-CM | POA: Diagnosis not present

## 2018-03-11 DIAGNOSIS — Z8249 Family history of ischemic heart disease and other diseases of the circulatory system: Secondary | ICD-10-CM | POA: Diagnosis not present

## 2018-03-11 DIAGNOSIS — R0602 Shortness of breath: Secondary | ICD-10-CM

## 2018-03-11 DIAGNOSIS — I129 Hypertensive chronic kidney disease with stage 1 through stage 4 chronic kidney disease, or unspecified chronic kidney disease: Secondary | ICD-10-CM | POA: Diagnosis present

## 2018-03-11 DIAGNOSIS — Y95 Nosocomial condition: Secondary | ICD-10-CM | POA: Diagnosis present

## 2018-03-11 DIAGNOSIS — Z9181 History of falling: Secondary | ICD-10-CM | POA: Diagnosis not present

## 2018-03-11 DIAGNOSIS — Z90710 Acquired absence of both cervix and uterus: Secondary | ICD-10-CM | POA: Diagnosis not present

## 2018-03-11 DIAGNOSIS — R109 Unspecified abdominal pain: Secondary | ICD-10-CM | POA: Diagnosis not present

## 2018-03-11 DIAGNOSIS — J189 Pneumonia, unspecified organism: Secondary | ICD-10-CM | POA: Diagnosis present

## 2018-03-11 DIAGNOSIS — D6959 Other secondary thrombocytopenia: Secondary | ICD-10-CM | POA: Diagnosis present

## 2018-03-11 DIAGNOSIS — R3129 Other microscopic hematuria: Secondary | ICD-10-CM | POA: Diagnosis not present

## 2018-03-11 DIAGNOSIS — N201 Calculus of ureter: Secondary | ICD-10-CM

## 2018-03-11 DIAGNOSIS — N179 Acute kidney failure, unspecified: Secondary | ICD-10-CM | POA: Diagnosis present

## 2018-03-11 DIAGNOSIS — R509 Fever, unspecified: Secondary | ICD-10-CM | POA: Diagnosis not present

## 2018-03-11 DIAGNOSIS — N202 Calculus of kidney with calculus of ureter: Secondary | ICD-10-CM | POA: Diagnosis not present

## 2018-03-11 DIAGNOSIS — C9200 Acute myeloblastic leukemia, not having achieved remission: Secondary | ICD-10-CM | POA: Diagnosis not present

## 2018-03-11 DIAGNOSIS — J9600 Acute respiratory failure, unspecified whether with hypoxia or hypercapnia: Secondary | ICD-10-CM | POA: Diagnosis not present

## 2018-03-11 DIAGNOSIS — R740 Nonspecific elevation of levels of transaminase and lactic acid dehydrogenase [LDH]: Secondary | ICD-10-CM | POA: Diagnosis present

## 2018-03-11 DIAGNOSIS — D619 Aplastic anemia, unspecified: Secondary | ICD-10-CM

## 2018-03-11 DIAGNOSIS — D696 Thrombocytopenia, unspecified: Secondary | ICD-10-CM | POA: Diagnosis not present

## 2018-03-11 LAB — URINALYSIS, COMPLETE (UACMP) WITH MICROSCOPIC
Bilirubin Urine: NEGATIVE
Glucose, UA: NEGATIVE mg/dL
KETONES UR: NEGATIVE mg/dL
Leukocytes, UA: NEGATIVE
Nitrite: NEGATIVE
PH: 5 (ref 5.0–8.0)
Protein, ur: 30 mg/dL — AB
SQUAMOUS EPITHELIAL / LPF: NONE SEEN (ref 0–5)
Specific Gravity, Urine: 1.006 (ref 1.005–1.030)

## 2018-03-11 LAB — MICROSCOPIC EXAMINATION: WBC UA: NONE SEEN /HPF (ref 0–5)

## 2018-03-11 LAB — CBC WITH DIFFERENTIAL/PLATELET
BAND NEUTROPHILS: 0 %
BASOS PCT: 0 %
Basophils Absolute: 0 10*3/uL (ref 0–0.1)
Blasts: 0 %
EOS PCT: 0 %
Eosinophils Absolute: 0 10*3/uL (ref 0–0.7)
HEMATOCRIT: 26.6 % — AB (ref 35.0–47.0)
Hemoglobin: 8.7 g/dL — ABNORMAL LOW (ref 12.0–16.0)
LYMPHS PCT: 36 %
Lymphs Abs: 2.6 10*3/uL (ref 1.0–3.6)
MCH: 33.1 pg (ref 26.0–34.0)
MCHC: 32.6 g/dL (ref 32.0–36.0)
MCV: 101.2 fL — AB (ref 80.0–100.0)
MYELOCYTES: 0 %
Metamyelocytes Relative: 0 %
Monocytes Absolute: 0.9 10*3/uL (ref 0.2–0.9)
Monocytes Relative: 12 %
NEUTROS PCT: 52 %
NRBC: 1 /100{WBCs} — AB
Neutro Abs: 3.6 10*3/uL (ref 1.4–6.5)
Other: 0 %
Platelets: 32 10*3/uL — ABNORMAL LOW (ref 150–440)
Promyelocytes Relative: 0 %
RBC: 2.63 MIL/uL — AB (ref 3.80–5.20)
RDW: 17.7 % — ABNORMAL HIGH (ref 11.5–14.5)
WBC: 7.1 10*3/uL (ref 3.6–11.0)

## 2018-03-11 LAB — LACTIC ACID, PLASMA
LACTIC ACID, VENOUS: 1 mmol/L (ref 0.5–1.9)
Lactic Acid, Venous: 0.9 mmol/L (ref 0.5–1.9)

## 2018-03-11 LAB — COMPREHENSIVE METABOLIC PANEL
ALBUMIN: 4 g/dL (ref 3.5–5.0)
ALK PHOS: 141 U/L — AB (ref 38–126)
ALT: 184 U/L — AB (ref 0–44)
AST: 42 U/L — AB (ref 15–41)
Anion gap: 6 (ref 5–15)
BUN: 19 mg/dL (ref 8–23)
CALCIUM: 9 mg/dL (ref 8.9–10.3)
CO2: 26 mmol/L (ref 22–32)
CREATININE: 1.34 mg/dL — AB (ref 0.44–1.00)
Chloride: 111 mmol/L (ref 98–111)
GFR calc Af Amer: 43 mL/min — ABNORMAL LOW (ref >60)
GFR, EST NON AFRICAN AMERICAN: 37 mL/min — AB (ref >60)
GLUCOSE: 124 mg/dL — AB (ref 70–99)
POTASSIUM: 4.3 mmol/L (ref 3.5–5.1)
Sodium: 143 mmol/L (ref 135–145)
TOTAL PROTEIN: 7.4 g/dL (ref 6.5–8.1)
Total Bilirubin: 1.4 mg/dL — ABNORMAL HIGH (ref 0.3–1.2)

## 2018-03-11 LAB — URINALYSIS, COMPLETE
BILIRUBIN UA: NEGATIVE
GLUCOSE, UA: NEGATIVE
KETONES UA: NEGATIVE
Leukocytes, UA: NEGATIVE
NITRITE UA: NEGATIVE
SPEC GRAV UA: 1.01 (ref 1.005–1.030)
UUROB: 0.2 mg/dL (ref 0.2–1.0)
pH, UA: 5 (ref 5.0–7.5)

## 2018-03-11 LAB — BLOOD GAS, VENOUS
PATIENT TEMPERATURE: 37
PCO2 VEN: 48 mmHg (ref 44.0–60.0)
PH VEN: 7.34 (ref 7.250–7.430)
pO2, Ven: 40 mmHg (ref 32.0–45.0)

## 2018-03-11 MED ORDER — ALBUTEROL SULFATE (2.5 MG/3ML) 0.083% IN NEBU
5.0000 mg | INHALATION_SOLUTION | Freq: Once | RESPIRATORY_TRACT | Status: AC
  Administered 2018-03-11: 5 mg via RESPIRATORY_TRACT
  Filled 2018-03-11: qty 6

## 2018-03-11 MED ORDER — SODIUM CHLORIDE 0.9 % IV SOLN
500.0000 mg | Freq: Once | INTRAVENOUS | Status: AC
  Administered 2018-03-11: 500 mg via INTRAVENOUS
  Filled 2018-03-11: qty 500

## 2018-03-11 MED ORDER — IOHEXOL 300 MG/ML  SOLN
60.0000 mL | Freq: Once | INTRAMUSCULAR | Status: AC | PRN
  Administered 2018-03-11: 60 mL via INTRAVENOUS

## 2018-03-11 MED ORDER — VANCOMYCIN HCL IN DEXTROSE 1-5 GM/200ML-% IV SOLN
1000.0000 mg | Freq: Once | INTRAVENOUS | Status: AC
  Administered 2018-03-11: 1000 mg via INTRAVENOUS
  Filled 2018-03-11: qty 200

## 2018-03-11 MED ORDER — SODIUM CHLORIDE 0.9 % IV SOLN
1.0000 g | Freq: Once | INTRAVENOUS | Status: AC
  Administered 2018-03-11: 1 g via INTRAVENOUS
  Filled 2018-03-11: qty 1

## 2018-03-11 MED ORDER — ONDANSETRON HCL 4 MG/2ML IJ SOLN
4.0000 mg | Freq: Once | INTRAMUSCULAR | Status: AC
  Administered 2018-03-11: 4 mg via INTRAVENOUS
  Filled 2018-03-11: qty 2

## 2018-03-11 MED ORDER — ALBUTEROL SULFATE (2.5 MG/3ML) 0.083% IN NEBU
INHALATION_SOLUTION | RESPIRATORY_TRACT | Status: AC
  Administered 2018-03-11: 5 mg via RESPIRATORY_TRACT
  Filled 2018-03-11: qty 6

## 2018-03-11 NOTE — H&P (Signed)
[place] [facility] [place] NAME: Teresa Carrillo    MR#:  [id number]  DATE OF BIRTH:  1941-01-21  DATE OF ADMISSION:  03/11/2018  PRIMARY CARE PHYSICIAN: Arnetha Courser, MD   REQUESTING/REFERRING PHYSICIAN:   CHIEF COMPLAINT:  No chief complaint on file.   HISTORY OF PRESENT ILLNESS: Teresa Carrillo  is a 77 y.o. female with a known history of acute myeloid leukemia on chemotherapy, diabetes type 2, hypertension, hyperlipidemia, kidney stones and other comorbidities. Patient presented to emergency room for shortness of breath going on for the past 2 days, gradually getting worse.  Patient also admits to occasional cough with sputum in the past 2 days.  Denies chest pain or fever at home. Blood test done emergency room are remarkable for hemoglobin level at 8.7 platelet count of 32; creatinine level is 1.34.  Lactic acid level is 1. EKG, reviewed by myself shows normal sinus rhythm with a heart rate of 85 no acute ST-T changes., CT of the chest shows multiple ill-defined nodular opacities throughout both lungs. Patient is admitted for further evaluation and treatment.  PAST MEDICAL HISTORY:   Past Medical History:  Diagnosis Date  . Abdominal wall mass   . Anginal pain ([place])   . Anxiety   . Arthritis   . Calculus of kidney   . Cystitis   . Depression   . Diabetes mellitus without complication (HCC)    elevated A1c  . Dyspnea on exertion   . Elevated serum creatinine   . Fibrocystic breast disease   . GERD (gastroesophageal reflux disease)   . Hearing loss   . Heart murmur   . HTN (hypertension)   . Hyperlipidemia   . Kidney stones   . MDS (myelodysplastic syndrome), high grade ([place]) 04/23/2016  . Microscopic hematuria   . Mouth sores   . Mucositis due to chemotherapy   . Obesity   . Obesity, morbid ([place]) 09/24/2017   BMI > 35 with type 2 diabetes  . Pneumonia   . Risk for falls   . Sleep apnea   . Thrombocytopenia ([place])   .  Urinary frequency   . Urinary urgency     PAST SURGICAL HISTORY:  Past Surgical History:  Procedure Laterality Date  . ABDOMINAL HYSTERECTOMY    . APPENDECTOMY    . CARDIAC CATHETERIZATION     x2  . CHOLECYSTECTOMY    . COLONOSCOPY N/A 02/24/2015   Procedure: COLONOSCOPY;  Surgeon: Manya Silvas, MD;  Location: [facility] ENDOSCOPY;  Service: Endoscopy;  Laterality: N/A;  . DIAGNOSTIC LAPAROSCOPY     Removal of benign abdominal tumor  . ESOPHAGOGASTRODUODENOSCOPY N/A 02/24/2015   Procedure: ESOPHAGOGASTRODUODENOSCOPY (EGD);  Surgeon: Manya Silvas, MD;  Location[facility] ENDOSCOPY;  Service: Endoscopy;  Laterality: N/A;  . IR IMAGING GUIDED PORT INSERTION  10/09/2017  . right eye surgery Right     SOCIAL HISTORY:  Social History   Tobacco Use  . Smoking status: Former Smoker    Types: Cigarettes    Last attempt to quit: 09/09/1988    Years since quitting: 29.5  . Smokeless tobacco: Never Used  . Tobacco comment: quit 25 years ago  Substance Use Topics  . Alcohol use: No    Alcohol/week: 0.0 oz    FAMILY HISTORY:  Family History  Problem Relation Age of Onset  . Congestive Heart Failure Mother   . Diabetes Mother   . Coronary artery disease Mother   . Stroke Mother   .  Cirrhosis Father   . Diabetes Brother     DRUG ALLERGIES:  Allergies  Allergen Reactions  . Macrobid [employer] Macro] Other (See Comments)    Reaction: unknown    REVIEW OF SYSTEMS:   CONSTITUTIONAL: No fever, but patient complains of fatigue and generalized weakness.  EYES: No blurred or double vision.  EARS, NOSE, AND THROAT: No tinnitus or ear pain.  RESPIRATORY: Positive for shortness of breath.  No cough, wheezing or hemoptysis.  CARDIOVASCULAR: No chest pain, orthopnea, edema.  GASTROINTESTINAL: No nausea, vomiting, diarrhea or abdominal pain.  GENITOURINARY: No dysuria, hematuria.  ENDOCRINE: No polyuria, nocturia,  HEMATOLOGY: No anemia, easy bruising or bleeding SKIN: No  rash or lesion. MUSCULOSKELETAL: No joint pain or arthritis.   NEUROLOGIC: No focal weakness.  PSYCHIATRY: No anxiety or depression.   MEDICATIONS AT HOME:  Prior to Admission medications   Medication Sig Start Date End Date Taking? Authorizing Provider  chlorhexidine (PERIDEX) 0.12 % solution 15 mLs by Mouth Rinse route 2 (two) times daily. 11/17/17  Yes [provider]  clonazePAM (KLONOPIN) 0.5 MG tablet Take 0.5 mg by mouth 2 (two) times daily.    Yes [provider]  diltiazem (CARDIZEM CD) 180 MG 24 hr capsule Take 1 capsule (180 mg total) by mouth daily. 03/27/17 03/27/18 Yes Darel Hong, MD  lidocaine-prilocaine (EMLA) cream Apply 1 application topically as needed. Apply small amount to port site at least 1 hour prior to it being accessed, cover with plastic wrap 10/13/17  Yes Cammie Sickle, MD  ondansetron (ZOFRAN ODT) 4 MG disintegrating tablet Take 1 tablet (4 mg total) by mouth every 8 (eight) hours as needed for nausea or vomiting. 03/08/18  Yes Alfred Levins, [place], MD  oxyCODONE (ROXICODONE) 5 MG immediate release tablet Take 1 tablet (5 mg total) by mouth every 8 (eight) hours as needed. 03/08/18 03/08/19 Yes Veronese, [place], MD  sertraline (ZOLOFT) 100 MG tablet Take 100 mg by mouth daily at 12 noon.    Yes [provider]  tamsulosin (FLOMAX) 0.4 MG CAPS capsule Take 1 capsule (0.4 mg total) by mouth daily for 7 days. 03/08/18 03/15/18 Yes Veronese, [place], MD  umeclidinium-vilanterol [facility] ELLIPTA) 62.5-25 MCG/INH AEPB Inhale 1 puff into the lungs as needed. 09/22/17  Yes [provider]      PHYSICAL EXAMINATION:   VITAL SIGNS: Blood pressure 135/64, pulse 85, temperature 98 F (36.7 C), temperature source Oral, resp. rate (!) 35, height 5\' 3"  (1.6 m), weight 104.3 kg (230 lb), SpO2 97 %.  GENERAL:  77 y.o.-year-old patient lying in the bed with no acute distress, at rest.  EYES: Pupils equal, round, reactive to light and  accommodation. No scleral icterus. HEENT: Head atraumatic, normocephalic. Oropharynx and nasopharynx clear.  NECK:  Supple, no jugular venous distention. No thyroid enlargement, no tenderness.  LUNGS: Normal breath sounds bilaterally, no wheezing, rales,rhonchi or crepitation. No use of accessory muscles of respiration.  CARDIOVASCULAR: S1, S2 normal. No S3/S4.  ABDOMEN: Soft, nontender, nondistended. Bowel sounds present. No organomegaly or mass.  EXTREMITIES: No pedal edema, cyanosis, or clubbing.  NEUROLOGIC: Cranial nerves II through XII are intact. Muscle strength 5/5 in all extremities. Sensation intact. Gait is stable.  PSYCHIATRIC: The patient is alert and oriented x 3.  SKIN: No obvious rash, lesion, or ulcer.   LABORATORY PANEL:   CBC Recent Labs  Lab 03/05/18 1137 03/08/18 1620 03/11/18 1730  WBC 3.8 4.6 7.1  HGB 9.9* 10.2* 8.7*  HCT 29.7* 29.9* 26.6*  PLT 56* 54* 32*  MCV 102.1* 99.8 101.2*  MCH 33.9 34.0 33.1  MCHC 33.2 34.1 32.6  RDW 17.2* 17.0* 17.7*  LYMPHSABS 1.7  --  2.6  MONOABS 0.4  --  0.9  EOSABS 0.0  --  0.0  BASOSABS 0.0  --  0.0   ------------------------------------------------------------------------------------------------------------------  Chemistries  Recent Labs  Lab 03/08/18 1620 03/11/18 1630  NA 138 143  K 4.4 4.3  CL 108 111  CO2 22 26  GLUCOSE 162* 124*  BUN 17 19  CREATININE 1.29* 1.34*  CALCIUM 8.5* 9.0  AST  --  42*  ALT  --  184*  ALKPHOS  --  141*  BILITOT  --  1.4*   ------------------------------------------------------------------------------------------------------------------ estimated creatinine clearance is 41.3 mL/min (A) (by C-G formula based on SCr of 1.34 mg/dL (H)). ------------------------------------------------------------------------------------------------------------------ No results for input(s): TSH, T4TOTAL, T3FREE, THYROIDAB in the last 72 hours.  Invalid input(s): FREET3   Coagulation  profile No results for input(s): INR, PROTIME in the last 168 hours. ------------------------------------------------------------------------------------------------------------------- No results for input(s): DDIMER in the last 72 hours. -------------------------------------------------------------------------------------------------------------------  Cardiac Enzymes No results for input(s): CKMB, TROPONINI, MYOGLOBIN in the last 168 hours.  Invalid input(s): CK ------------------------------------------------------------------------------------------------------------------ Invalid input(s): POCBNP  ---------------------------------------------------------------------------------------------------------------  Urinalysis    Component Value Date/Time   COLORURINE YELLOW (A) 03/11/2018 1710   APPEARANCEUR HAZY (A) 03/11/2018 1710   APPEARANCEUR Cloudy (A) 03/11/2018 1117   LABSPEC 1.006 03/11/2018 1710   PHURINE 5.0 03/11/2018 1710   GLUCOSEU NEGATIVE 03/11/2018 1710   HGBUR LARGE (A) 03/11/2018 1710   BILIRUBINUR NEGATIVE 03/11/2018 1710   BILIRUBINUR Negative 03/11/2018 1117   KETONESUR NEGATIVE 03/11/2018 1710   PROTEINUR 30 (A) 03/11/2018 1710   UROBILINOGEN 0.2 12/24/2017 1043   NITRITE NEGATIVE 03/11/2018 1710   LEUKOCYTESUR NEGATIVE 03/11/2018 1710   LEUKOCYTESUR Negative 03/11/2018 1117     RADIOLOGY: Dg Chest 2 View  Result Date: 03/11/2018 CLINICAL DATA:  Acute shortness of breath. Patient with leukemia currently on chemotherapy. EXAM: CHEST - 2 VIEW COMPARISON:  03/08/2018 abdominal CT and prior studies FINDINGS: Mild cardiomegaly and LEFT IJ Port-A-Cath with tip overlying the mid SVC again noted. Multiple ill-defined nodular opacities throughout both lungs are present. Possible trace effusions noted. There is no evidence of pneumothorax. No acute bony abnormalities are present. IMPRESSION: Multiple ill-defined nodular opacities throughout both, nonspecific but most  likely represents infection. Other treatment related changes or leukemic infiltrates are considerations. Consider chest CT with contrast for further evaluation. Electronically Signed   By: Margarette Canada M.D.   On: 03/11/2018 14:19   Ct Chest W Contrast  Result Date: 03/11/2018 CLINICAL DATA:  77 year old female with shortness of breath. Currently on chemotherapy for leukemia. EXAM: CT CHEST WITH CONTRAST TECHNIQUE: Multidetector CT imaging of the chest was performed during intravenous contrast administration. CONTRAST:  68mL OMNIPAQUE IOHEXOL 300 MG/ML  SOLN COMPARISON:  03/11/2018 and prior chest radiographs. 03/08/2018 abdominal CT and 08/25/2017 chest CT FINDINGS: Cardiovascular: Heart size normal. Coronary artery and thoracic aortic atherosclerotic calcifications again identified. A LEFT-sided Port-A-Cath is identified with tip at the SUPERIOR cavoatrial junction. No aortic aneurysm or pericardial effusion. Mediastinum/Nodes: No enlarged mediastinal, hilar, or axillary lymph nodes. Thyroid gland, trachea, and esophagus demonstrate no significant findings. Lungs/Pleura: Multiple ill-defined nodular opacities scattered throughout both lungs noted. The LOWER lung opacities appear relatively unchanged from 03/08/2018. These nodular opacities are new since 08/25/2017. A trace RIGHT pleural effusion is noted. No pneumothorax. Upper Abdomen: Hepatosplenomegaly again noted. A 2.1 x 2.5 cm  soft tissue mass in the LEFT anterior abdominal subcutaneous tissues is unchanged. Musculoskeletal: No chest wall abnormality. No acute or significant osseous findings. IMPRESSION: 1. Multiple ill-defined nodular opacities scattered throughout both lungs, nonspecific but most likely representing infectious etiologies (typical and atypical) given patient's history of leukemia on chemotherapy. Leukemic infiltrates or lymphoproliferative disease not excluded. 2. Unchanged 2.1 x 2.5 cm soft tissue mass in the LEFT anterior abdominal  subcutaneous tissues. Malignancy not excluded. 3. Hepatosplenomegaly 4. Coronary artery and Aortic Atherosclerosis (ICD10-I70.0). Electronically Signed   By: Margarette Canada M.D.   On: 03/11/2018 18:07    EKG: Orders placed or performed during the hospital encounter of 03/11/18  . ED EKG  . ED EKG  . ED EKG  . ED EKG    IMPRESSION AND PLAN:  1.  Pneumonia in a patient with low immunity due to leukemia and chemotherapy.  We will start broad-spectrum IV antibiotics.  Continue oxygen therapy as needed and nebulizer treatments. 2.  Anemia and thrombocytopenia secondary to leukemia.  No active bleeding.  Continue to monitor clinically closely and monitor CBC daily.  Will hold any anticoagulants. 3.  CKD3, stable.  Creatinine level is at baseline.  Continue to monitor kidney function closely and avoid nephrotoxic medications. 4.  Acute myeloid leukemia, ongoing chemotherapy.  Continue management per oncology. 5.  Diabetes type 2.  Low-carb diet.  Will monitor blood sugars before meals and at bedtime and use insulin treatment during the hospital stay.  All the records are reviewed and case discussed with ED provider. Management plans discussed with the patient and she is in agreement.  CODE STATUS: DNR Code Status History    Date Active Date Inactive Code Status Order ID Comments User Context   07/18/2017 1708 07/19/2017 1632 DNR [id number]  Cammie Sickle, MD Inpatient   07/17/2017 1856 07/18/2017 1708 Full Code [id number]  Nicholes Mango, MD Inpatient   06/19/2016 1733 06/20/2016 1411 Full Code [id number]  Henreitta Leber, MD Inpatient   05/07/2016 2327 05/10/2016 1434 Full Code [id number]  Lance Coon, MD Inpatient    Questions for Most Recent Historical Code Status (Order [id number])    Question Answer Comment   In the event of cardiac or respiratory ARREST Do not call a "code blue"    In the event of cardiac or respiratory ARREST Do not perform Intubation, CPR, defibrillation or ACLS    In  the event of cardiac or respiratory ARREST Use medication by any route, position, wound care, and other measures to relive pain and suffering. May use oxygen, suction and manual treatment of airway obstruction as needed for comfort.    Comments discussed with pt/family; in agreement- Dr.B        TOTAL TIME TAKING CARE OF THIS PATIENT: 45 minutes.    Amelia Jo M.D on 03/11/2018 at 11:10 PM  Between 7am to 6pm - Pager - [phone]  After 6pm go to [email or web address] - password [email or web address] [facility]  Office  [phone]  CC: Primary care physician; Arnetha Courser, MD

## 2018-03-11 NOTE — ED Notes (Addendum)
Pt back to bed with the assistance of this tech, pt working to breath on exertion, pt placed back on heart monitor, o2 saturation monitor as well as BP monitoring. Pt o2 sat at 75%,  MD and RN notified, this tech obtained verbal order from MD to place pt on 2L on o2. Pt placed on o2 and o2 sat up to 99%. MD and RN notified.

## 2018-03-11 NOTE — ED Notes (Signed)
Two unsuccessful PIV attempts per previous RN.

## 2018-03-11 NOTE — ED Notes (Signed)
Pt ambulatory to bathroom with one assist, pt dyspneic on exertion, hypoxic at 84% upon getting back to bed.  Pt placed on 2L nasal cannula at this time.

## 2018-03-11 NOTE — ED Triage Notes (Signed)
Was sent here by Primary Md for a CT chest for possible blood clot, states shob since this am. Was seen here with kidney stones this past weekend. Appears in NAD.

## 2018-03-11 NOTE — ED Notes (Signed)
Pt states dr has concerns for pneumonia as well. Hold CT scan for now per Dr Corky Downs.

## 2018-03-11 NOTE — Progress Notes (Signed)
Name: Teresa Carrillo   MRN: [id number]    DOB: Apr 04, 1941   Date:03/11/2018       Progress Note  Subjective  Chief Complaint  Chief Complaint  Patient presents with  . Pneumonia    symptoms of pneumonia    HPI  Patient was seen in ER on 6/30 for left flank pain dx with kidney stones; saw urology today scheduled for ureteral stent placement but noted to be short of breath. Patient states yesterday had some shortness of breath, and cough, woke up this morning with a sore throat and states feels so winded; shortness of breath is significantly worse. States even with rest feels like she cant get a good breath. Denies chest pain, fevers or chills.  Denies recent surgeries, no calf swelling, no long travels or immobilizations.  Was on oral chemotherapy but due to elevated liver enzymes was taken off on Sunday.   Patient Active Problem List   Diagnosis Date Noted  . Medication monitoring encounter 12/24/2017  . Paroxysmal atrial fibrillation ([place]) 09/24/2017  . Obesity, morbid ([place]) 09/24/2017  . Panic attack 06/11/2017  . Acute myeloid leukemia not having achieved remission ([place]) 06/02/2017  . Encounter for antineoplastic chemotherapy 02/17/2017  . Type 2 diabetes mellitus with nephropathy ([place]) 01/24/2017  . Thrombocytopenia ([place]) 10/21/2016  . Mucosal bleeding 06/19/2016  . MDS (myelodysplastic syndrome), high grade ([place]) 04/23/2016  . Anemia 02/21/2016  . Tick-borne disease 02/21/2016  . Cough 02/19/2016  . Proteinuria 05/10/2015  . Microscopic hematuria 05/08/2015  . Renal stone 05/08/2015  . Dyslipidemia 05/05/2015  . Hyperlipidemia, unspecified 05/05/2015  . Arteriosclerosis of coronary artery 02/02/2015  . H/O: HTN (hypertension) 02/02/2015  . COPD, mild ([place]) 05/02/2014  . Breathlessness on exertion 05/02/2014    Past Medical History:  Diagnosis Date  . Abdominal wall mass   . Anginal pain ([place])   . Anxiety   . Arthritis   . Calculus of kidney   . Cystitis   .  Depression   . Diabetes mellitus without complication (HCC)    elevated A1c  . Dyspnea on exertion   . Elevated serum creatinine   . Fibrocystic breast disease   . GERD (gastroesophageal reflux disease)   . Hearing loss   . Heart murmur   . HTN (hypertension)   . Hyperlipidemia   . Kidney stones   . MDS (myelodysplastic syndrome), high grade ([place]) 04/23/2016  . Microscopic hematuria   . Mouth sores   . Mucositis due to chemotherapy   . Obesity   . Obesity, morbid ([place]) 09/24/2017   BMI > 35 with type 2 diabetes  . Pneumonia   . Risk for falls   . Sleep apnea   . Thrombocytopenia ([place])   . Urinary frequency   . Urinary urgency     Past Surgical History:  Procedure Laterality Date  . ABDOMINAL HYSTERECTOMY    . APPENDECTOMY    . CARDIAC CATHETERIZATION     x2  . CHOLECYSTECTOMY    . COLONOSCOPY N/A 02/24/2015   Procedure: COLONOSCOPY;  Surgeon: Manya Silvas, MD;  Location: [facility] ENDOSCOPY;  Service: Endoscopy;  Laterality: N/A;  . DIAGNOSTIC LAPAROSCOPY     Removal of benign abdominal tumor  . ESOPHAGOGASTRODUODENOSCOPY N/A 02/24/2015   Procedure: ESOPHAGOGASTRODUODENOSCOPY (EGD);  Surgeon: Manya Silvas, MD;  Location: [facility] ENDOSCOPY;  Service: Endoscopy;  Laterality: N/A;  . IR IMAGING GUIDED PORT INSERTION  10/09/2017  . right eye surgery Right     Social History  Tobacco Use  . Smoking status: Former Smoker    Types: Cigarettes    Last attempt to quit: 09/09/1988    Years since quitting: 29.5  . Smokeless tobacco: Never Used  . Tobacco comment: quit 25 years ago  Substance Use Topics  . Alcohol use: No    Alcohol/week: 0.0 oz     Current Outpatient Medications:  .  buPROPion (WELLBUTRIN XL) 150 MG 24 hr tablet, Take 300 mg by mouth daily at 12 noon. , Disp: , Rfl:  .  chlorhexidine (PERIDEX) 0.12 % solution, 15 mLs by Mouth Rinse route 2 (two) times daily., Disp: , Rfl: 4 .  clonazePAM (KLONOPIN) 0.5 MG tablet, Take 0.5 mg by mouth 2 (two) times daily.  , Disp: , Rfl:  .  diltiazem (CARDIZEM CD) 180 MG 24 hr capsule, Take 1 capsule (180 mg total) by mouth daily., Disp: 30 capsule, Rfl: 0 .  lidocaine-prilocaine (EMLA) cream, Apply 1 application topically as needed. Apply small amount to port site at least 1 hour prior to it being accessed, cover with plastic wrap, Disp: 30 g, Rfl: 1 .  ondansetron (ZOFRAN ODT) 4 MG disintegrating tablet, Take 1 tablet (4 mg total) by mouth every 8 (eight) hours as needed for nausea or vomiting., Disp: 20 tablet, Rfl: 0 .  oxyCODONE (ROXICODONE) 5 MG immediate release tablet, Take 1 tablet (5 mg total) by mouth every 8 (eight) hours as needed., Disp: 20 tablet, Rfl: 0 .  sertraline (ZOLOFT) 100 MG tablet, Take 100 mg by mouth daily at 12 noon. , Disp: , Rfl:  .  tamsulosin (FLOMAX) 0.4 MG CAPS capsule, Take 1 capsule (0.4 mg total) by mouth daily for 7 days., Disp: 7 capsule, Rfl: 0 .  umeclidinium-vilanterol ([facility] ELLIPTA) 62.5-25 MCG/INH AEPB, Inhale 1 puff into the lungs as needed., Disp: , Rfl:   Allergies  Allergen Reactions  . Macrobid [employer] Macro] Other (See Comments)    Reaction: unknown    ROS   No other specific complaints in a complete review of systems (except as listed in HPI above).  Objective  Vitals:   03/11/18 1258  BP: (!) 160/60  Pulse: 89  Resp: (!) 24  Temp: 98.3 F (36.8 C)  TempSrc: Oral  SpO2: 96%  Weight: 233 lb 8 oz (105.9 kg)  Height: 5\' 3"  (1.6 m)     Body mass index is 41.36 kg/m.  Nursing Note and Vital Signs reviewed.  Physical Exam   Constitutional: Patient appears well-developed and well-nourished. Obese  No distress.  HEENT: head atraumatic, normocephalic, pupils equal and reactive to light, TM's without erythema or bulging,  no maxillary or frontal sinus tenderness , neck supple without lymphadenopathy, oropharynx red and irritated and moist without exudate,  Cardiovascular: Normal rate, regular rhythm, S1/S2 present.  Pulses  intact Pulmonary/Chest: Effort normal and breath sounds diminished throughout; likely due to large body ahbitus. Patient able to speak full sentences but breathing quickly.  Psychiatric: Patient has a normal mood and affect. behavior is normal. Judgment and thought content normal.  Assessment & Plan  1. Shortness of breath Pneumonia vs. PE, patient higher risk due malignancy, immune suppression- stating she feels shortness of breath is severe, while she doesn't appear septic, she is breathing quickly and appears uncomfortable. Discussed case with Dr. Sanda Klein who recommended ER evaluation, patient aggreable.

## 2018-03-11 NOTE — ED Notes (Signed)
Pt up to use restroom with the assistance of this tech

## 2018-03-11 NOTE — ED Provider Notes (Signed)
[facility] Emergency Department Provider Note   ____________________________________________   First MD Initiated Contact with Patient 03/11/18 1615     (approximate)  I have reviewed the triage vital signs and the nursing notes.   HISTORY  Chief Complaint No chief complaint on file. Chief complaint shortness of breath  HPI EZELLE SURPRENANT is a 77 y.o. female who reports she's been short of breath since this morning. She has a cough occasionally productive of some black phlegm. She is not running a fever. She reports she had kidney stones and has been "knocked on her back" from the kidney stones and the pain is caused. She feels very weak. She says she can't walk more than 10 or 15 feet without getting short of breath. Here in the emergency room I walked her back and forth in front of the bed 3 time she finally said she felt like she had to sit down she was getting woozy and breathing very hard but her O2 s but her O2 sats did not drop below 97%.   Past Medical History:  Diagnosis Date  . Abdominal wall mass   . Anginal pain ([place])   . Anxiety   . Arthritis   . Calculus of kidney   . Cystitis   . Depression   . Diabetes mellitus without complication (HCC)    elevated A1c  . Dyspnea on exertion   . Elevated serum creatinine   . Fibrocystic breast disease   . GERD (gastroesophageal reflux disease)   . Hearing loss   . Heart murmur   . HTN (hypertension)   . Hyperlipidemia   . Kidney stones   . MDS (myelodysplastic syndrome), high grade ([place]) 04/23/2016  . Microscopic hematuria   . Mouth sores   . Mucositis due to chemotherapy   . Obesity   . Obesity, morbid ([place]) 09/24/2017   BMI > 35 with type 2 diabetes  . Pneumonia   . Risk for falls   . Sleep apnea   . Thrombocytopenia ([place])   . Urinary frequency   . Urinary urgency     Patient Active Problem List   Diagnosis Date Noted  . Medication monitoring encounter 12/24/2017  . Paroxysmal  atrial fibrillation ([place]) 09/24/2017  . Obesity, morbid ([place]) 09/24/2017  . Panic attack 06/11/2017  . Acute myeloid leukemia not having achieved remission ([place]) 06/02/2017  . Encounter for antineoplastic chemotherapy 02/17/2017  . Type 2 diabetes mellitus with nephropathy ([place]) 01/24/2017  . Thrombocytopenia ([place]) 10/21/2016  . Mucosal bleeding 06/19/2016  . MDS (myelodysplastic syndrome), high grade ([place]) 04/23/2016  . Anemia 02/21/2016  . Tick-borne disease 02/21/2016  . Cough 02/19/2016  . Proteinuria 05/10/2015  . Microscopic hematuria 05/08/2015  . Renal stone 05/08/2015  . Dyslipidemia 05/05/2015  . Hyperlipidemia, unspecified 05/05/2015  . Arteriosclerosis of coronary artery 02/02/2015  . H/O: HTN (hypertension) 02/02/2015  . COPD, mild ([place]) 05/02/2014  . Breathlessness on exertion 05/02/2014    Past Surgical History:  Procedure Laterality Date  . ABDOMINAL HYSTERECTOMY    . APPENDECTOMY    . CARDIAC CATHETERIZATION     x2  . CHOLECYSTECTOMY    . COLONOSCOPY N/A 02/24/2015   Procedure: COLONOSCOPY;  Surgeon: Manya Silvas, MD;  Location[facility] ENDOSCOPY;  Service: Endoscopy;  Laterality: N/A;  . DIAGNOSTIC LAPAROSCOPY     Removal of benign abdominal tumor  . ESOPHAGOGASTRODUODENOSCOPY N/A 02/24/2015   Procedure: ESOPHAGOGASTRODUODENOSCOPY (EGD);  Surgeon: Manya Silvas, MD;  Location: [place];  Service: Endoscopy;  Laterality: N/A;  . IR IMAGING GUIDED PORT INSERTION  10/09/2017  . right eye surgery Right     Prior to Admission medications   Medication Sig Start Date End Date Taking? Authorizing Provider  chlorhexidine (PERIDEX) 0.12 % solution 15 mLs by Mouth Rinse route 2 (two) times daily. 11/17/17  Yes [provider]  clonazePAM (KLONOPIN) 0.5 MG tablet Take 0.5 mg by mouth 2 (two) times daily.    Yes [provider]  diltiazem (CARDIZEM CD) 180 MG 24 hr capsule Take 1 capsule (180 mg total) by mouth daily. 03/27/17 03/27/18 Yes  Darel Hong, MD  lidocaine-prilocaine (EMLA) cream Apply 1 application topically as needed. Apply small amount to port site at least 1 hour prior to it being accessed, cover with plastic wrap 10/13/17  Yes Cammie Sickle, MD  ondansetron (ZOFRAN ODT) 4 MG disintegrating tablet Take 1 tablet (4 mg total) by mouth every 8 (eight) hours as needed for nausea or vomiting. 03/08/18  Yes Alfred Levins, [place], MD  oxyCODONE (ROXICODONE) 5 MG immediate release tablet Take 1 tablet (5 mg total) by mouth every 8 (eight) hours as needed. 03/08/18 03/08/19 Yes Veronese, [place], MD  sertraline (ZOLOFT) 100 MG tablet Take 100 mg by mouth daily at 12 noon.    Yes [provider]  tamsulosin (FLOMAX) 0.4 MG CAPS capsule Take 1 capsule (0.4 mg total) by mouth daily for 7 days. 03/08/18 03/15/18 Yes Veronese, [place], MD  umeclidinium-vilanterol [place] ELLIPTA) 62.5-25 MCG/INH AEPB Inhale 1 puff into the lungs as needed. 09/22/17  Yes [provider]    Allergies Macrobid [nitrofurantoin monohyd macro]  Family History  Problem Relation Age of Onset  . Congestive Heart Failure Mother   . Diabetes Mother   . Coronary artery disease Mother   . Stroke Mother   . Cirrhosis Father   . Diabetes Brother     Social History Social History   Tobacco Use  . Smoking status: Former Smoker    Types: Cigarettes    Last attempt to quit: 09/09/1988    Years since quitting: 29.5  . Smokeless tobacco: Never Used  . Tobacco comment: quit 25 years ago  Substance Use Topics  . Alcohol use: No    Alcohol/week: 0.0 oz  . Drug use: No    Review of Systems  Constitutional: No fever/chills Eyes: No visual changes. ENT: No sore throat. Cardiovascular: Denies chest pain. Respiratory:shortness of breath. Gastrointestinal: No abdominal pain.  No nausea, no vomiting.  No diarrhea.  No constipation. Genitourinary: Negative for dysuria. Musculoskeletal:  back pain from her kidney stones. Skin: Negative  for rash. Neurological: Negative for headaches, focal weakness   ____________________________________________   PHYSICAL EXAM:  VITAL SIGNS: ED Triage Vitals  Enc Vitals Group     BP 03/11/18 1342 (!) 171/70     Pulse Rate 03/11/18 1342 90     Resp 03/11/18 1342 18     Temp 03/11/18 1339 98 F (36.7 C)     Temp Source 03/11/18 1339 Oral     SpO2 03/11/18 1342 98 %     Weight 03/11/18 1343 230 lb (104.3 kg)     Height 03/11/18 1343 5\' 3"  (1.6 m)     Head Circumference --      Peak Flow --      Pain Score 03/11/18 1343 0     Pain Loc --      Pain Edu? --      Excl. in [place]? --  Constitutional: Alert and oriented. Well appearing and in no acute distress. Eyes: Conjunctivae are normal. patient is a droopy eyelid on the right which is chronic Head: Atraumatic. Nose: No congestion/rhinnorhea. Mouth/Throat: Mucous membranes are moist.  Oropharynx non-erythematous. Neck: No stridor.  Cardiovascular: Normal rate, regular rhythm. Grossly normal heart sounds.  Good peripheral circulation. Respiratory: Normal respiratory effort.  No retractions. Lungs CTAB. Gastrointestinal: Soft and nontender. No distention. No abdominal bruits. No CVA tenderness. Musculoskeletal: No lower extremity tenderness nor edema. Neurologic:  Normal speech and language. No gross focal neurologic deficits are appreciated. No gait instability. Skin:  Skin is warm, dry and intact. No rash noted. Psychiatric: Mood and affect are normal. Speech and behavior are normal.  ____________________________________________   LABS (all labs ordered are listed, but only abnormal results are displayed)  Labs Reviewed  COMPREHENSIVE METABOLIC PANEL - Abnormal; Notable for the following components:      Result Value   Glucose, Bld 124 (*)    Creatinine, Ser 1.34 (*)    AST 42 (*)    ALT 184 (*)    Alkaline Phosphatase 141 (*)    Total Bilirubin 1.4 (*)    GFR calc non Af Amer 37 (*)    GFR calc Af Amer 43 (*)     All other components within normal limits  URINALYSIS, COMPLETE (UACMP) WITH MICROSCOPIC - Abnormal; Notable for the following components:   Color, Urine YELLOW (*)    APPearance HAZY (*)    Hgb urine dipstick LARGE (*)    Protein, ur 30 (*)    RBC / HPF >50 (*)    Bacteria, UA RARE (*)    Crystals PRESENT (*)    All other components within normal limits  CBC WITH DIFFERENTIAL/PLATELET - Abnormal; Notable for the following components:   RBC 2.63 (*)    Hemoglobin 8.7 (*)    HCT 26.6 (*)    MCV 101.2 (*)    RDW 17.7 (*)    Platelets 32 (*)    All other components within normal limits  CULTURE, BLOOD (ROUTINE X 2)  CULTURE, BLOOD (ROUTINE X 2)  LACTIC ACID, PLASMA  BLOOD GAS, VENOUS  CBC WITH DIFFERENTIAL/PLATELET  LACTIC ACID, PLASMA   ____________________________________________  EKG  EKG read and interpreted by me shows normal sinus rhythm rate of 85 normal axis decreased R-wave progression and no acute changes ____________________________________________  RADIOLOGY  ED MD interpretation:  chest x-ray read by radiology reviewed by me shows multiple ill-defined nodular opacities. CT with contrast is suggested. This was done and reviewed by me and multiple ill-defined nodules bilaterally.  Official radiology report(s): Dg Chest 2 View  Result Date: 03/11/2018 CLINICAL DATA:  Acute shortness of breath. Patient with leukemia currently on chemotherapy. EXAM: CHEST - 2 VIEW COMPARISON:  03/08/2018 abdominal CT and prior studies FINDINGS: Mild cardiomegaly and LEFT IJ Port-A-Cath with tip overlying the mid SVC again noted. Multiple ill-defined nodular opacities throughout both lungs are present. Possible trace effusions noted. There is no evidence of pneumothorax. No acute bony abnormalities are present. IMPRESSION: Multiple ill-defined nodular opacities throughout both, nonspecific but most likely represents infection. Other treatment related changes or leukemic infiltrates are  considerations. Consider chest CT with contrast for further evaluation. Electronically Signed   By: Margarette Canada M.D.   On: 03/11/2018 14:19   Ct Chest W Contrast  Result Date: 03/11/2018 CLINICAL DATA:  77 year old female with shortness of breath. Currently on chemotherapy for leukemia. EXAM: CT CHEST WITH CONTRAST TECHNIQUE: Multidetector  CT imaging of the chest was performed during intravenous contrast administration. CONTRAST:  74mL OMNIPAQUE IOHEXOL 300 MG/ML  SOLN COMPARISON:  03/11/2018 and prior chest radiographs. 03/08/2018 abdominal CT and 08/25/2017 chest CT FINDINGS: Cardiovascular: Heart size normal. Coronary artery and thoracic aortic atherosclerotic calcifications again identified. A LEFT-sided Port-A-Cath is identified with tip at the SUPERIOR cavoatrial junction. No aortic aneurysm or pericardial effusion. Mediastinum/Nodes: No enlarged mediastinal, hilar, or axillary lymph nodes. Thyroid gland, trachea, and esophagus demonstrate no significant findings. Lungs/Pleura: Multiple ill-defined nodular opacities scattered throughout both lungs noted. The LOWER lung opacities appear relatively unchanged from 03/08/2018. These nodular opacities are new since 08/25/2017. A trace RIGHT pleural effusion is noted. No pneumothorax. Upper Abdomen: Hepatosplenomegaly again noted. A 2.1 x 2.5 cm soft tissue mass in the LEFT anterior abdominal subcutaneous tissues is unchanged. Musculoskeletal: No chest wall abnormality. No acute or significant osseous findings. IMPRESSION: 1. Multiple ill-defined nodular opacities scattered throughout both lungs, nonspecific but most likely representing infectious etiologies (typical and atypical) given patient's history of leukemia on chemotherapy. Leukemic infiltrates or lymphoproliferative disease not excluded. 2. Unchanged 2.1 x 2.5 cm soft tissue mass in the LEFT anterior abdominal subcutaneous tissues. Malignancy not excluded. 3. Hepatosplenomegaly 4. Coronary artery and  Aortic Atherosclerosis (ICD10-I70.0). Electronically Signed   By: Margarette Canada M.D.   On: 03/11/2018 18:07    ____________________________________________   PROCEDURES  Procedure(s) performed:   Procedures  Critical Care performed: critical care time 45 minutes this includes discussing the case with Dr. Owens Shark Monday the medical oncologist on-call reviewing her films are old records as far as I can and course taking the history.  ____________________________________________   INITIAL IMPRESSION / ASSESSMENT AND PLAN / ED COURSE  discussed the patient Dr. Owens Shark Monday on call for medical oncology. He feels it would be best to admit the patient treated with antibiotics and then work on discovering the etiology of these ill-defined nodular densities which could be the leukemia as well.  Clinical Course as of Mar 11 1828  Wed Mar 11, 2018  1809 CBC with Differential [PM]    Clinical Course User Index [PM] Nena Polio, MD     ____________________________________________   FINAL CLINICAL IMPRESSION(S) / ED DIAGNOSES  Final diagnoses:  Dyspnea, unspecified type  Abnormal chest CT   also probable pneumonia  ED Discharge Orders    None       Note:  This document was prepared using Dragon voice recognition software and may include unintentional dictation errors.    Nena Polio, MD 03/11/18 [phone]

## 2018-03-12 ENCOUNTER — Other Ambulatory Visit: Payer: Self-pay

## 2018-03-12 DIAGNOSIS — N132 Hydronephrosis with renal and ureteral calculous obstruction: Secondary | ICD-10-CM

## 2018-03-12 DIAGNOSIS — R1032 Left lower quadrant pain: Secondary | ICD-10-CM

## 2018-03-12 DIAGNOSIS — I10 Essential (primary) hypertension: Secondary | ICD-10-CM

## 2018-03-12 DIAGNOSIS — D630 Anemia in neoplastic disease: Secondary | ICD-10-CM

## 2018-03-12 DIAGNOSIS — C9200 Acute myeloblastic leukemia, not having achieved remission: Secondary | ICD-10-CM

## 2018-03-12 DIAGNOSIS — R509 Fever, unspecified: Secondary | ICD-10-CM

## 2018-03-12 DIAGNOSIS — E119 Type 2 diabetes mellitus without complications: Secondary | ICD-10-CM

## 2018-03-12 DIAGNOSIS — R109 Unspecified abdominal pain: Secondary | ICD-10-CM

## 2018-03-12 DIAGNOSIS — Z87891 Personal history of nicotine dependence: Secondary | ICD-10-CM

## 2018-03-12 DIAGNOSIS — R0602 Shortness of breath: Secondary | ICD-10-CM

## 2018-03-12 LAB — BASIC METABOLIC PANEL
ANION GAP: 5 (ref 5–15)
ANION GAP: 6 (ref 5–15)
BUN: 17 mg/dL (ref 8–23)
BUN: 16 mg/dL (ref 8–23)
CALCIUM: 8.3 mg/dL — AB (ref 8.9–10.3)
CHLORIDE: 112 mmol/L — AB (ref 98–111)
CO2: 27 mmol/L (ref 22–32)
CO2: 25 mmol/L (ref 22–32)
CREATININE: 1.18 mg/dL — AB (ref 0.44–1.00)
Calcium: 8.1 mg/dL — ABNORMAL LOW (ref 8.9–10.3)
Chloride: 111 mmol/L (ref 98–111)
Creatinine, Ser: 1.27 mg/dL — ABNORMAL HIGH (ref 0.44–1.00)
GFR calc Af Amer: 51 mL/min — ABNORMAL LOW (ref >60)
GFR calc Af Amer: 46 mL/min — ABNORMAL LOW (ref >60)
GFR calc non Af Amer: 40 mL/min — ABNORMAL LOW (ref >60)
GFR, EST NON AFRICAN AMERICAN: 44 mL/min — AB (ref >60)
GLUCOSE: 127 mg/dL — AB (ref 70–99)
GLUCOSE: 128 mg/dL — AB (ref 70–99)
POTASSIUM: 4.1 mmol/L (ref 3.5–5.1)
Potassium: 4 mmol/L (ref 3.5–5.1)
Sodium: 143 mmol/L (ref 135–145)
Sodium: 143 mmol/L (ref 135–145)

## 2018-03-12 LAB — CBC
HCT: 24 % — ABNORMAL LOW (ref 35.0–47.0)
Hemoglobin: 7.9 g/dL — ABNORMAL LOW (ref 12.0–16.0)
MCH: 32.7 pg (ref 26.0–34.0)
MCHC: 32.7 g/dL (ref 32.0–36.0)
MCV: 99.9 fL (ref 80.0–100.0)
PLATELETS: UNDETERMINED 10*3/uL (ref 150–440)
RBC: 2.4 MIL/uL — ABNORMAL LOW (ref 3.80–5.20)
RDW: 17.4 % — AB (ref 11.5–14.5)
WBC: 5.5 10*3/uL (ref 3.6–11.0)

## 2018-03-12 LAB — PREPARE RBC (CROSSMATCH)

## 2018-03-12 LAB — GLUCOSE, CAPILLARY: Glucose-Capillary: 94 mg/dL (ref 70–99)

## 2018-03-12 MED ORDER — ACETAMINOPHEN 325 MG PO TABS
650.0000 mg | ORAL_TABLET | Freq: Four times a day (QID) | ORAL | Status: DC | PRN
  Administered 2018-03-12 – 2018-03-13 (×4): 650 mg via ORAL
  Filled 2018-03-12 (×4): qty 2

## 2018-03-12 MED ORDER — ONDANSETRON HCL 4 MG/2ML IJ SOLN
4.0000 mg | Freq: Four times a day (QID) | INTRAMUSCULAR | Status: DC | PRN
  Administered 2018-03-13 – 2018-03-15 (×4): 4 mg via INTRAVENOUS
  Filled 2018-03-12 (×4): qty 2

## 2018-03-12 MED ORDER — HYDROCODONE-ACETAMINOPHEN 5-325 MG PO TABS
1.0000 | ORAL_TABLET | ORAL | Status: DC | PRN
  Administered 2018-03-14: 1 via ORAL
  Administered 2018-03-14: 10:00:00 2 via ORAL
  Filled 2018-03-12: qty 1
  Filled 2018-03-12: qty 2

## 2018-03-12 MED ORDER — ACETAMINOPHEN 325 MG PO TABS
650.0000 mg | ORAL_TABLET | Freq: Once | ORAL | Status: AC
  Administered 2018-03-12: 650 mg via ORAL
  Filled 2018-03-12: qty 2

## 2018-03-12 MED ORDER — FUROSEMIDE 10 MG/ML IJ SOLN
20.0000 mg | Freq: Once | INTRAMUSCULAR | Status: AC
  Administered 2018-03-12: 20 mg via INTRAVENOUS
  Filled 2018-03-12: qty 2

## 2018-03-12 MED ORDER — DILTIAZEM HCL ER COATED BEADS 180 MG PO CP24
180.0000 mg | ORAL_CAPSULE | Freq: Every day | ORAL | Status: DC
  Administered 2018-03-12 – 2018-03-15 (×4): 180 mg via ORAL
  Filled 2018-03-12 (×6): qty 1

## 2018-03-12 MED ORDER — CHLORHEXIDINE GLUCONATE 0.12 % MT SOLN
15.0000 mL | Freq: Two times a day (BID) | OROMUCOSAL | Status: DC
  Administered 2018-03-12 – 2018-03-15 (×6): 15 mL via OROMUCOSAL
  Filled 2018-03-12 (×7): qty 15

## 2018-03-12 MED ORDER — SODIUM CHLORIDE 0.9% FLUSH: 10.0000 mL | INTRAVENOUS | Status: DC | PRN

## 2018-03-12 MED ORDER — IPRATROPIUM-ALBUTEROL 0.5-2.5 (3) MG/3ML IN SOLN
3.0000 mL | Freq: Four times a day (QID) | RESPIRATORY_TRACT | Status: DC
  Administered 2018-03-12 (×2): 3 mL via RESPIRATORY_TRACT
  Filled 2018-03-12 (×2): qty 3

## 2018-03-12 MED ORDER — HEPARIN SOD (PORK) LOCK FLUSH 100 UNIT/ML IV SOLN: 500.0000 [IU] | Freq: Every day | INTRAVENOUS | Status: DC | PRN

## 2018-03-12 MED ORDER — CLONAZEPAM 0.5 MG PO TABS
0.5000 mg | ORAL_TABLET | Freq: Two times a day (BID) | ORAL | Status: DC
  Administered 2018-03-12 – 2018-03-15 (×8): 0.5 mg via ORAL
  Filled 2018-03-12 (×8): qty 1

## 2018-03-12 MED ORDER — LIDOCAINE-PRILOCAINE 2.5-2.5 % EX CREA
1.0000 "application " | TOPICAL_CREAM | CUTANEOUS | Status: DC | PRN
  Filled 2018-03-12: qty 5

## 2018-03-12 MED ORDER — UMECLIDINIUM-VILANTEROL 62.5-25 MCG/INH IN AEPB
1.0000 | INHALATION_SPRAY | RESPIRATORY_TRACT | Status: DC | PRN
  Administered 2018-03-13 – 2018-03-15 (×4): 1 via RESPIRATORY_TRACT
  Filled 2018-03-12: qty 14

## 2018-03-12 MED ORDER — TAMSULOSIN HCL 0.4 MG PO CAPS
0.4000 mg | ORAL_CAPSULE | Freq: Every day | ORAL | Status: DC
  Administered 2018-03-12 – 2018-03-15 (×4): 0.4 mg via ORAL
  Filled 2018-03-12 (×4): qty 1

## 2018-03-12 MED ORDER — SODIUM CHLORIDE 0.9 % IV SOLN
2.0000 g | Freq: Two times a day (BID) | INTRAVENOUS | Status: DC
  Administered 2018-03-12 – 2018-03-15 (×7): 2 g via INTRAVENOUS
  Filled 2018-03-12 (×9): qty 2

## 2018-03-12 MED ORDER — SODIUM CHLORIDE 0.9% FLUSH: 3.0000 mL | INTRAVENOUS | Status: DC | PRN

## 2018-03-12 MED ORDER — SODIUM CHLORIDE 0.9 % IV SOLN: 500.0000 mg | INTRAVENOUS | Status: DC

## 2018-03-12 MED ORDER — SODIUM CHLORIDE 0.9 % IV SOLN
250.0000 mL | Freq: Once | INTRAVENOUS | Status: AC
  Administered 2018-03-12: 17:00:00 250 mL via INTRAVENOUS

## 2018-03-12 MED ORDER — VANCOMYCIN HCL 10 G IV SOLR
1250.0000 mg | INTRAVENOUS | Status: DC
  Administered 2018-03-12 – 2018-03-15 (×4): 1250 mg via INTRAVENOUS
  Filled 2018-03-12 (×6): qty 1250

## 2018-03-12 MED ORDER — HEPARIN SOD (PORK) LOCK FLUSH 100 UNIT/ML IV SOLN: 250.0000 [IU] | INTRAVENOUS | Status: DC | PRN

## 2018-03-12 MED ORDER — SODIUM CHLORIDE 0.9 % IV SOLN
INTRAVENOUS | Status: DC
  Administered 2018-03-12 (×2): via INTRAVENOUS

## 2018-03-12 MED ORDER — SERTRALINE HCL 50 MG PO TABS
100.0000 mg | ORAL_TABLET | Freq: Every day | ORAL | Status: DC
  Administered 2018-03-12 – 2018-03-15 (×4): 100 mg via ORAL
  Filled 2018-03-12 (×4): qty 2

## 2018-03-12 MED ORDER — OXYCODONE HCL 5 MG PO TABS
5.0000 mg | ORAL_TABLET | Freq: Three times a day (TID) | ORAL | Status: DC | PRN
  Administered 2018-03-14 (×2): 5 mg via ORAL
  Filled 2018-03-12 (×2): qty 1

## 2018-03-12 MED ORDER — AZITHROMYCIN 500 MG PO TABS
250.0000 mg | ORAL_TABLET | Freq: Every day | ORAL | Status: AC
  Administered 2018-03-12 – 2018-03-15 (×4): 250 mg via ORAL
  Filled 2018-03-12 (×4): qty 1

## 2018-03-12 MED ORDER — IPRATROPIUM-ALBUTEROL 0.5-2.5 (3) MG/3ML IN SOLN
3.0000 mL | Freq: Three times a day (TID) | RESPIRATORY_TRACT | Status: DC
  Administered 2018-03-12 – 2018-03-15 (×10): 3 mL via RESPIRATORY_TRACT
  Filled 2018-03-12 (×10): qty 3

## 2018-03-12 MED ORDER — ACETAMINOPHEN 325 MG PO TABS
650.0000 mg | ORAL_TABLET | Freq: Once | ORAL | Status: AC
  Administered 2018-03-13: 650 mg via ORAL
  Filled 2018-03-12: qty 2

## 2018-03-12 MED ORDER — DIPHENHYDRAMINE HCL 25 MG PO CAPS
25.0000 mg | ORAL_CAPSULE | Freq: Once | ORAL | Status: AC
  Administered 2018-03-12: 17:00:00 25 mg via ORAL
  Filled 2018-03-12: qty 1

## 2018-03-12 MED ORDER — TRAZODONE HCL 50 MG PO TABS: 25.0000 mg | ORAL_TABLET | Freq: Every evening | ORAL | Status: DC | PRN

## 2018-03-12 MED ORDER — ONDANSETRON HCL 4 MG PO TABS: 4.0000 mg | ORAL_TABLET | Freq: Four times a day (QID) | ORAL | Status: DC | PRN

## 2018-03-12 MED ORDER — DOCUSATE SODIUM 100 MG PO CAPS
100.0000 mg | ORAL_CAPSULE | Freq: Two times a day (BID) | ORAL | Status: DC
  Administered 2018-03-12 – 2018-03-15 (×8): 100 mg via ORAL
  Filled 2018-03-12 (×8): qty 1

## 2018-03-12 MED ORDER — ACETAMINOPHEN 650 MG RE SUPP: 650.0000 mg | Freq: Four times a day (QID) | RECTAL | Status: DC | PRN

## 2018-03-12 MED ORDER — BISACODYL 5 MG PO TBEC: 5.0000 mg | DELAYED_RELEASE_TABLET | Freq: Every day | ORAL | Status: DC | PRN

## 2018-03-12 NOTE — Consult Note (Addendum)
H&P Physician requesting consult: Epifanio Lesches, MD  Chief Complaint: Fever in the setting of a history of left ureteral calculi  History of Present Illness: 77 year old female who initially presented to the emergency department on 03/08/2018 with acute left-sided flank pain.  She subsequently had a CT scan which revealed distal ureteral calculi as well as bilateral renal calculi.  He had hydronephrosis on the left but none on the right.  She saw the physician's assistant with [facility] urology yesterday.  She was set up for left ureteroscopy with laser lithotripsy.  Yesterday, she developed progressive worsening shortness of breath.  She also developed a fever.  This prompted her to go to the emergency department and she was admitted to the hospital.  She continues to be febrile.  Last temperature was 101.8.  Creatinine is 1.27 and she has no leukocytosis.  However, she is on chemotherapy for history of leukemia.  She had a CT of the chest performed yesterday which revealed evidence of likely pneumonia.  She currently denies having any urinary symptoms.  Urinalysis yesterday revealed negative leukocytes, negative nitrite, rare bacteria and greater than 50 RBCs.  She denies any voiding symptoms including dysuria.  She currently has no flank pain at all.  She states she was having some flank pain yesterday but again she has none today.  Other than fever, her vital signs are quite stable.  Past Medical History:  Diagnosis Date  . Abdominal wall mass   . Anginal pain ([place])   . Anxiety   . Arthritis   . Calculus of kidney   . Cystitis   . Depression   . Diabetes mellitus without complication (HCC)    elevated A1c  . Dyspnea on exertion   . Elevated serum creatinine   . Fibrocystic breast disease   . GERD (gastroesophageal reflux disease)   . Hearing loss   . Heart murmur   . HTN (hypertension)   . Hyperlipidemia   . Kidney stones   . MDS (myelodysplastic syndrome), high grade ([place])  04/23/2016  . Microscopic hematuria   . Mouth sores   . Mucositis due to chemotherapy   . Obesity   . Obesity, morbid ([place]) 09/24/2017   BMI > 35 with type 2 diabetes  . Pneumonia   . Risk for falls   . Sleep apnea   . Thrombocytopenia ([place])   . Urinary frequency   . Urinary urgency    Past Surgical History:  Procedure Laterality Date  . ABDOMINAL HYSTERECTOMY    . APPENDECTOMY    . CARDIAC CATHETERIZATION     x2  . CHOLECYSTECTOMY    . COLONOSCOPY N/A 02/24/2015   Procedure: COLONOSCOPY;  Surgeon: Manya Silvas, MD;  Location: [facility] ENDOSCOPY;  Service: Endoscopy;  Laterality: N/A;  . DIAGNOSTIC LAPAROSCOPY     Removal of benign abdominal tumor  . ESOPHAGOGASTRODUODENOSCOPY N/A 02/24/2015   Procedure: ESOPHAGOGASTRODUODENOSCOPY (EGD);  Surgeon: Manya Silvas, MD;  Location: [facility] ENDOSCOPY;  Service: Endoscopy;  Laterality: N/A;  . IR IMAGING GUIDED PORT INSERTION  10/09/2017  . right eye surgery Right     Home Medications:  Medications Prior to Admission  Medication Sig Dispense Refill Last Dose  . chlorhexidine (PERIDEX) 0.12 % solution 15 mLs by Mouth Rinse route 2 (two) times daily.  4 03/11/2018 at Unknown time  . clonazePAM (KLONOPIN) 0.5 MG tablet Take 0.5 mg by mouth 2 (two) times daily.    03/11/2018 at Unknown time  . diltiazem (CARDIZEM CD) 180 MG 24  hr capsule Take 1 capsule (180 mg total) by mouth daily. 30 capsule 0 03/11/2018 at Unknown time  . lidocaine-prilocaine (EMLA) cream Apply 1 application topically as needed. Apply small amount to port site at least 1 hour prior to it being accessed, cover with plastic wrap 30 g 1 PRN at PRN  . ondansetron (ZOFRAN ODT) 4 MG disintegrating tablet Take 1 tablet (4 mg total) by mouth every 8 (eight) hours as needed for nausea or vomiting. 20 tablet 0 PRN at PRN  . oxyCODONE (ROXICODONE) 5 MG immediate release tablet Take 1 tablet (5 mg total) by mouth every 8 (eight) hours as needed. 20 tablet 0 PRN at PRN  . sertraline (ZOLOFT)  100 MG tablet Take 100 mg by mouth daily at 12 noon.    03/11/2018 at Unknown time  . tamsulosin (FLOMAX) 0.4 MG CAPS capsule Take 1 capsule (0.4 mg total) by mouth daily for 7 days. 7 capsule 0 03/11/2018 at Unknown time  . umeclidinium-vilanterol (ANORO ELLIPTA) 62.5-25 MCG/INH AEPB Inhale 1 puff into the lungs as needed.   PRN at PRN   Allergies:  Allergies  Allergen Reactions  . Macrobid [employer] Macro] Other (See Comments)    Reaction: unknown    Family History  Problem Relation Age of Onset  . Congestive Heart Failure Mother   . Diabetes Mother   . Coronary artery disease Mother   . Stroke Mother   . Cirrhosis Father   . Diabetes Brother    Social History:  reports that she quit smoking about 29 years ago. Her smoking use included cigarettes. She has never used smokeless tobacco. She reports that she does not drink alcohol or use drugs.  ROS: A complete review of systems was performed.  All systems are negative except for pertinent findings as noted. ROS   Physical Exam:  Vital signs in last 24 hours: Temp:  [98 F (36.7 C)-102.7 F (39.3 C)] 101.8 F (38.8 C) (07/04 1145) Pulse Rate:  [69-97] 88 (07/04 1030) Resp:  [18-35] 20 (07/04 0439) BP: (112-171)/(46-83) 132/47 (07/04 1030) SpO2:  [94 %-100 %] 100 % (07/04 1030) Weight:  [104.3 kg (230 lb)-104.8 kg (231 lb 0.7 oz)] 104.8 kg (231 lb 0.7 oz) (07/04 0439) General:  Alert and oriented, No acute distress HEENT: Normocephalic, atraumatic Neck: No JVD or lymphadenopathy Cardiovascular: Regular rate and rhythm Lungs: Mild tachypnea but regular rate and effort Abdomen: Soft, nontender, nondistended, morbidly obese Back: No CVA tenderness Extremities: No edema Neurologic: Grossly intact  Laboratory Data:  Results for orders placed or performed during the hospital encounter of 03/11/18 (from the past 24 hour(s))  Comprehensive metabolic panel     Status: Abnormal   Collection Time: 03/11/18  4:30 PM   Result Value Ref Range   Sodium 143 135 - 145 mmol/L   Potassium 4.3 3.5 - 5.1 mmol/L   Chloride 111 98 - 111 mmol/L   CO2 26 22 - 32 mmol/L   Glucose, Bld 124 (H) 70 - 99 mg/dL   BUN 19 8 - 23 mg/dL   Creatinine, Ser 1.34 (H) 0.44 - 1.00 mg/dL   Calcium 9.0 8.9 - 10.3 mg/dL   Total Protein 7.4 6.5 - 8.1 g/dL   Albumin 4.0 3.5 - 5.0 g/dL   AST 42 (H) 15 - 41 U/L   ALT 184 (H) 0 - 44 U/L   Alkaline Phosphatase 141 (H) 38 - 126 U/L   Total Bilirubin 1.4 (H) 0.3 - 1.2 mg/dL   GFR  calc non Af Amer 37 (L) >60 mL/min   GFR calc Af Amer 43 (L) >60 mL/min   Anion gap 6 5 - 15  Urinalysis, Complete w Microscopic     Status: Abnormal   Collection Time: 03/11/18  5:10 PM  Result Value Ref Range   Color, Urine YELLOW (A) YELLOW   APPearance HAZY (A) CLEAR   Specific Gravity, Urine 1.006 1.005 - 1.030   pH 5.0 5.0 - 8.0   Glucose, UA NEGATIVE NEGATIVE mg/dL   Hgb urine dipstick LARGE (A) NEGATIVE   Bilirubin Urine NEGATIVE NEGATIVE   Ketones, ur NEGATIVE NEGATIVE mg/dL   Protein, ur 30 (A) NEGATIVE mg/dL   Nitrite NEGATIVE NEGATIVE   Leukocytes, UA NEGATIVE NEGATIVE   RBC / HPF >50 (H) 0 - 5 RBC/hpf   WBC, UA 11-20 0 - 5 WBC/hpf   Bacteria, UA RARE (A) NONE SEEN   Squamous Epithelial / LPF NONE SEEN 0 - 5   Crystals PRESENT (A) NEGATIVE  Culture, blood (routine x 2)     Status: None (Preliminary result)   Collection Time: 03/11/18  5:10 PM  Result Value Ref Range   Specimen Description BLOOD LEFT ANTECUBITAL    Special Requests Blood Culture adequate volume    Culture      NO GROWTH < 12 HOURS Performed at [facility], [address] [place], [place] [address]    Report Status PENDING   Culture, blood (routine x 2)     Status: None (Preliminary result)   Collection Time: 03/11/18  5:10 PM  Result Value Ref Range   Specimen Description BLOOD RIGHT ANTECUBITAL    Special Requests Blood Culture adequate volume    Culture      NO GROWTH < 12 HOURS Performed at  [facility], [address] [place], [place] [address]    Report Status PENDING   Lactic acid, plasma     Status: None   Collection Time: 03/11/18  5:10 PM  Result Value Ref Range   Lactic Acid, Venous 0.9 0.5 - 1.9 mmol/L  Blood gas, venous     Status: None   Collection Time: 03/11/18  5:10 PM  Result Value Ref Range   pH, Ven 7.34 7.250 - 7.430   pCO2, Ven 48 44.0 - 60.0 mmHg   pO2, Ven 40.0 32.0 - 45.0 mmHg   Patient temperature 37.0    Collection site VENOUS    Sample type VENOUS   CBC with Differential/Platelet     Status: Abnormal   Collection Time: 03/11/18  5:30 PM  Result Value Ref Range   WBC 7.1 3.6 - 11.0 K/uL   RBC 2.63 (L) 3.80 - 5.20 MIL/uL   Hemoglobin 8.7 (L) 12.0 - 16.0 g/dL   HCT 26.6 (L) 35.0 - 47.0 %   MCV 101.2 (H) 80.0 - 100.0 fL   MCH 33.1 26.0 - 34.0 pg   MCHC 32.6 32.0 - 36.0 g/dL   RDW 17.7 (H) 11.5 - 14.5 %   Platelets 32 (L) 150 - 440 K/uL   Neutrophils Relative % 52 %   Lymphocytes Relative 36 %   Monocytes Relative 12 %   Eosinophils Relative 0 %   Basophils Relative 0 %   Band Neutrophils 0 %   Metamyelocytes Relative 0 %   Myelocytes 0 %   Promyelocytes Relative 0 %   Blasts 0 %   nRBC 1 (H) 0 /100 WBC   Other 0 %   Neutro  Abs 3.6 1.4 - 6.5 K/uL   Lymphs Abs 2.6 1.0 - 3.6 K/uL   Monocytes Absolute 0.9 0.2 - 0.9 K/uL   Eosinophils Absolute 0.0 0 - 0.7 K/uL   Basophils Absolute 0.0 0 - 0.1 K/uL   RBC Morphology POLYCHROMASIA PRESENT    WBC Morphology ATYPICAL LYMPHOCYTES   Lactic acid, plasma     Status: None   Collection Time: 03/11/18  7:46 PM  Result Value Ref Range   Lactic Acid, Venous 1.0 0.5 - 1.9 mmol/L  Basic metabolic panel     Status: Abnormal   Collection Time: 03/12/18  4:18 AM  Result Value Ref Range   Sodium 143 135 - 145 mmol/L   Potassium 4.0 3.5 - 5.1 mmol/L   Chloride 111 98 - 111 mmol/L   CO2 27 22 - 32 mmol/L   Glucose, Bld 127 (H) 70 - 99 mg/dL   BUN 17 8 - 23 mg/dL   Creatinine, Ser 1.18 (H)  0.44 - 1.00 mg/dL   Calcium 8.3 (L) 8.9 - 10.3 mg/dL   GFR calc non Af Amer 44 (L) >60 mL/min   GFR calc Af Amer 51 (L) >60 mL/min   Anion gap 5 5 - 15  CBC     Status: Abnormal   Collection Time: 03/12/18  4:18 AM  Result Value Ref Range   WBC 5.5 3.6 - 11.0 K/uL   RBC 2.40 (L) 3.80 - 5.20 MIL/uL   Hemoglobin 7.9 (L) 12.0 - 16.0 g/dL   HCT 24.0 (L) 35.0 - 47.0 %   MCV 99.9 80.0 - 100.0 fL   MCH 32.7 26.0 - 34.0 pg   MCHC 32.7 32.0 - 36.0 g/dL   RDW 17.4 (H) 11.5 - 14.5 %   Platelets PLATELET CLUMPS NOTED ON SMEAR, UNABLE TO ESTIMATE 150 - 440 K/uL  Basic metabolic panel     Status: Abnormal   Collection Time: 03/12/18  4:18 AM  Result Value Ref Range   Sodium 143 135 - 145 mmol/L   Potassium 4.1 3.5 - 5.1 mmol/L   Chloride 112 (H) 98 - 111 mmol/L   CO2 25 22 - 32 mmol/L   Glucose, Bld 128 (H) 70 - 99 mg/dL   BUN 16 8 - 23 mg/dL   Creatinine, Ser 1.27 (H) 0.44 - 1.00 mg/dL   Calcium 8.1 (L) 8.9 - 10.3 mg/dL   GFR calc non Af Amer 40 (L) >60 mL/min   GFR calc Af Amer 46 (L) >60 mL/min   Anion gap 6 5 - 15  Glucose, capillary     Status: None   Collection Time: 03/12/18  7:34 AM  Result Value Ref Range   Glucose-Capillary 94 70 - 99 mg/dL  Prepare RBC     Status: None   Collection Time: 03/12/18 11:51 AM  Result Value Ref Range   Order Confirmation      ORDER PROCESSED BY BLOOD BANK Performed at [facility], [address] [place], [place] [address]   Type and screen [place]     Status: None (Preliminary result)   Collection Time: 03/12/18 11:51 AM  Result Value Ref Range   ABO/RH(D) A POS    Antibody Screen NEG    Sample Expiration      03/15/2018 Performed at [facility], [address], [place], [place] [address]    Unit Number [id number]    Blood Component Type RED CELLS,LR    Unit division 00  Status of Unit ALLOCATED    Transfusion Status OK TO TRANSFUSE    Crossmatch Result Compatible    Recent Results  (from the past 240 hour(s))  Microscopic Examination     Status: Abnormal   Collection Time: 03/11/18 11:17 AM  Result Value Ref Range Status   WBC, UA None seen 0 - 5 /hpf Final   RBC, UA >30 (H) 0 - 2 /hpf Final   Epithelial Cells (non renal) 0-10 0 - 10 /hpf Final   Crystals Present (A) N/A Final   Crystal Type Uric Acid N/A Final   Mucus, UA Present (A) Not Estab. Final   Bacteria, UA Moderate (A) None seen/Few Final  Culture, blood (routine x 2)     Status: None (Preliminary result)   Collection Time: 03/11/18  5:10 PM  Result Value Ref Range Status   Specimen Description BLOOD LEFT ANTECUBITAL  Final   Special Requests Blood Culture adequate volume  Final   Culture   Final    NO GROWTH < 12 HOURS Performed at [facility], [address] [place], [place] [address]    Report Status PENDING  Incomplete  Culture, blood (routine x 2)     Status: None (Preliminary result)   Collection Time: 03/11/18  5:10 PM  Result Value Ref Range Status   Specimen Description BLOOD RIGHT ANTECUBITAL  Final   Special Requests Blood Culture adequate volume  Final   Culture   Final    NO GROWTH < 12 HOURS Performed at [facility], [address] [place], [place] [address]    Report Status PENDING  Incomplete   Creatinine: Recent Labs    03/08/18 1620 03/11/18 1630 03/12/18 0418  CREATININE 1.29* 1.34* 1.27*  1.18*   CT scan of the chest personally reviewed and documented in the history of present illness.  CT scan of the abdomen and pelvis was performed on 03/08/2018 was also personally reviewed and documented in the history of present illness.  CT scan of the abdomen and pelvis in November 2018 was also reviewed and compared to the CT scan from 03/08/2018.  There is interval development and progression of bilateral renal calculi.  Composition of the stone most consistent with uric acid calculi  Impression/Assessment:  Left ureteral calculus with  hydronephrosis Fever secondary to pneumonia, possibly secondary to obstructing calculus  Plan:  Continue Flomax 0.4 mg daily.  This will help possibly pass the ureteral calculi.  She is currently completely asymptomatic from a urinary tract standpoint.  Urinalysis was negative and she had no dysuria and her flank pain has currently resolved.  In the setting of concurrent pneumonia, I think it is likely that her fever is from the respiratory source rather than a urinary source.  Therefore, do not recommend ureteral stent placement currently especially given the fact that that would require general anesthesia which may worsen her acute respiratory failure.  A nephrostomy tube is an option however her platelets are quite low making this a unfavorable option due to the fact that it is more invasive with a higher risk of bleeding.  For these reasons, recommend continuing antibiotics and managing the stone conservatively for now.  If fevers continue or she clinically declines, we will plan to reassess for possible ureteral stent placement.  She may have a diet for now but will make her n.p.o. at midnight for possible procedure if her clinical condition condition declines over the next day.  Consider the addition of 30 mEq of  potassium citrate daily given the likely composition of the stones to be uric acid.  Marton Redwood, III 03/12/2018, 1:15 PM

## 2018-03-12 NOTE — Progress Notes (Signed)
Pharmacy Antibiotic Note  Teresa Carrillo is a 77 y.o. female admitted on 03/11/2018 with pneumonia.  Pharmacy has been consulted for vanc/cefepime dosing. Patient received vanc 1g and cefepime 2g IV x 1 in ED and azithromycin 500 mg IV x 1  Plan: Will start vanc 1.25g IV q24h w/ 6 hour stack  Will draw vanc trough 07/07 @ 0200 prior to 4th dose. Will continue cefepime 2g IV q12h   Ke 0.0386 T1/2 18 ~ 24 hrs Goal trough 15 - 20 mcg/mL  Height: 5\' 3"  (160 cm) Weight: 230 lb 2.6 oz (104.4 kg) IBW/kg (Calculated) : 52.4  Temp (24hrs), Avg:99.3 F (37.4 C), Min:98 F (36.7 C), Max:102.7 F (39.3 C)  Recent Labs  Lab 03/05/18 1137 03/08/18 1620 03/11/18 1630 03/11/18 1710 03/11/18 1730 03/11/18 1946  WBC 3.8 4.6  --   --  7.1  --   CREATININE  --  1.29* 1.34*  --   --   --   LATICACIDVEN  --   --   --  0.9  --  1.0    Estimated Creatinine Clearance: 41.3 mL/min (A) (by C-G formula based on SCr of 1.34 mg/dL (H)).    Allergies  Allergen Reactions  . Macrobid [employer] Macro] Other (See Comments)    Reaction: unknown    Thank you for allowing pharmacy to be a part of this patient's care.  Tobie Lords, PharmD, BCPS Clinical Pharmacist 03/12/2018

## 2018-03-12 NOTE — Consult Note (Signed)
[facility] CONSULT NOTE  Patient Care Team: Lada, Satira Anis, [name] Cammie Sickle, MD as Consulting Physician (Internal Medicine) Gloriann Loan, MD as Attending Physician Erby Pian, MD as Referring Physician (Specialist) Yolonda Kida, MD as Consulting Physician (Cardiology) Elvina Mattes, Adele Schilder as Attending Physician (Podiatry) Lavonia Dana, MD as Consulting Physician (Nephrology)  CHIEF COMPLAINTS/PURPOSE OF CONSULTATION:  Acute myeloid leukemia/worsening shortness of breath  HISTORY OF PRESENTING ILLNESS:  Teresa Carrillo 77 y.o.  female patient is very pleasant-with a diagnosis of acute myeloid leukemia currently on IDH-2 inhibitor [Enadisenib] is currently admitted the hospital for worsening shortness of breath/fever.  Patient had transformed into acute myeloid leukemia approximately 6 months ago-when she was started on IDH 2 inhibitor [Enadisenib]-with clinical improvement/without need for any transfusions dependence.   More recently patient was evaluated at [facility] Dr. Royce Macadamia who noted patient had elevated LFTs; in the range of 500; and also noted to have up to 19% blasts in the peripheral blood-which is concerning for progression of her leukemia.  Patient was taken of doxycycline; and also taken off Enadisenib.   Patient however noted to have worsening bilateral flank pain left more than right for which she was evaluated by urology; and had a CT scan renal protocol.  This showed  Calculi/ sludge-which would need evacuation/stenting.  However the CT scan patient noted to have infiltrates-which was in the process of being worked up.  However on the day of admission patient noted to have a fever of 102; noted to have worsening shortness of breath and cough.  A CT scan in the emergency room confirmed bilateral-infiltrates-highly suspicious for atypical infection versus leukemic infiltrates [clinically less  likely].  ROS: Admits to easy bruising.  No gum bleeding.  Appetite is fair.  No weight loss.  A complete 10 point review of system is done which is negative except mentioned above in history of present illness  MEDICAL HISTORY:  Past Medical History:  Diagnosis Date  . Abdominal wall mass   . Anginal pain ([place])   . Anxiety   . Arthritis   . Calculus of kidney   . Cystitis   . Depression   . Diabetes mellitus without complication (HCC)    elevated A1c  . Dyspnea on exertion   . Elevated serum creatinine   . Fibrocystic breast disease   . GERD (gastroesophageal reflux disease)   . Hearing loss   . Heart murmur   . HTN (hypertension)   . Hyperlipidemia   . Kidney stones   . MDS (myelodysplastic syndrome), high grade ([place]) 04/23/2016  . Microscopic hematuria   . Mouth sores   . Mucositis due to chemotherapy   . Obesity   . Obesity, morbid ([place]) 09/24/2017   BMI > 35 with type 2 diabetes  . Pneumonia   . Risk for falls   . Sleep apnea   . Thrombocytopenia ([place])   . Urinary frequency   . Urinary urgency     SURGICAL HISTORY: Past Surgical History:  Procedure Laterality Date  . ABDOMINAL HYSTERECTOMY    . APPENDECTOMY    . CARDIAC CATHETERIZATION     x2  . CHOLECYSTECTOMY    . COLONOSCOPY N/A 02/24/2015   Procedure: COLONOSCOPY;  Surgeon: Manya Silvas, MD;  Location: [facility] ENDOSCOPY;  Service: Endoscopy;  Laterality: N/A;  . DIAGNOSTIC LAPAROSCOPY     Removal of benign abdominal tumor  . ESOPHAGOGASTRODUODENOSCOPY N/A 02/24/2015   Procedure: ESOPHAGOGASTRODUODENOSCOPY (EGD);  Surgeon: Manya Silvas, MD;  Location: [facility] ENDOSCOPY;  Service: Endoscopy;  Laterality: N/A;  . IR IMAGING GUIDED PORT INSERTION  10/09/2017  . right eye surgery Right     SOCIAL HISTORY: Social History   Socioeconomic History  . Marital status: Married    Spouse name: Jenny Reichmann  . Number of children: Not on file  . Years of education: Not on file  . Highest education level: Not on file   Occupational History  . Not on file  Social Needs  . Financial resource strain: Not hard at all  . Food insecurity:    Worry: Patient refused    Inability: Patient refused  . Transportation needs:    Medical: Patient refused    Non-medical: Patient refused  Tobacco Use  . Smoking status: Former Smoker    Types: Cigarettes    Last attempt to quit: 09/09/1988    Years since quitting: 29.5  . Smokeless tobacco: Never Used  . Tobacco comment: quit 25 years ago  Substance and Sexual Activity  . Alcohol use: No    Alcohol/week: 0.0 oz  . Drug use: No  . Sexual activity: Not Currently    Birth control/protection: Post-menopausal  Lifestyle  . Physical activity:    Days per week: Patient refused    Minutes per session: Patient refused  . Stress: Only a little  Relationships  . Social connections:    Talks on phone: Patient refused    Gets together: Patient refused    Attends religious service: Patient refused    Active member of club or organization: Patient refused    Attends meetings of clubs or organizations: Patient refused    Relationship status: Patient refused  . Intimate partner violence:    Fear of current or ex partner: Patient refused    Emotionally abused: Patient refused    Physically abused: Patient refused    Forced sexual activity: Patient refused  Other Topics Concern  . Not on file  Social History Narrative  . Not on file    FAMILY HISTORY: Family History  Problem Relation Age of Onset  . Congestive Heart Failure Mother   . Diabetes Mother   . Coronary artery disease Mother   . Stroke Mother   . Cirrhosis Father   . Diabetes Brother     ALLERGIES:  is allergic to macrobid [nitrofurantoin monohyd macro].  MEDICATIONS:  Current Facility-Administered Medications  Medication Dose Route Frequency Provider Last Rate Last Dose  . 0.9 %  sodium chloride infusion   Intravenous Continuous Amelia Jo, MD 75 mL/hr at 03/12/18 0300    . acetaminophen  (TYLENOL) tablet 650 mg  650 mg Oral Q6H PRN Amelia Jo, MD   650 mg at 03/12/18 1031   Or  . acetaminophen (TYLENOL) suppository 650 mg  650 mg Rectal Q6H PRN Amelia Jo, MD      . acetaminophen (TYLENOL) tablet 650 mg  650 mg Oral Once Cammie Sickle, MD      . bisacodyl (DULCOLAX) EC tablet 5 mg  5 mg Oral Daily PRN Amelia Jo, MD      . ceFEPIme (MAXIPIME) 2 g in sodium chloride 0.9 % 100 mL IVPB  2 g Intravenous Q12H Amelia Jo, MD 200 mL/hr at 03/12/18 0612 2 g at 03/12/18 0612  . chlorhexidine (PERIDEX) 0.12 % solution 15 mL  15 mL Mouth Rinse BID Amelia Jo, MD   15 mL at 03/12/18 1031  . clonazePAM (KLONOPIN) tablet 0.5 mg  0.5 mg  Oral BID Amelia Jo, MD   0.5 mg at 03/12/18 1031  . diltiazem (CARDIZEM CD) 24 hr capsule 180 mg  180 mg Oral Daily Amelia Jo, MD      . docusate sodium (COLACE) capsule 100 mg  100 mg Oral BID Amelia Jo, MD   100 mg at 03/12/18 1031  . HYDROcodone-acetaminophen (NORCO/VICODIN) 5-325 MG per tablet 1-2 tablet  1-2 tablet Oral Q4H PRN Amelia Jo, MD      . ipratropium-albuterol (DUONEB) 0.5-2.5 (3) MG/3ML nebulizer solution 3 mL  3 mL Nebulization TID Epifanio Lesches, MD      . lidocaine-prilocaine (EMLA) cream 1 application  1 application Topical PRN Amelia Jo, MD      . ondansetron [facility]) tablet 4 mg  4 mg Oral Q6H PRN Amelia Jo, MD       Or  . ondansetron [facility]) injection 4 mg  4 mg Intravenous Q6H PRN Amelia Jo, MD      . oxyCODONE (Oxy IR/ROXICODONE) immediate release tablet 5 mg  5 mg Oral Q8H PRN Amelia Jo, MD      . sertraline (ZOLOFT) tablet 100 mg  100 mg Oral Q1200 Amelia Jo, MD   100 mg at 03/12/18 1031  . tamsulosin (FLOMAX) capsule 0.4 mg  0.4 mg Oral Daily Amelia Jo, MD   0.4 mg at 03/12/18 1032  . traZODone (DESYREL) tablet 25 mg  25 mg Oral QHS PRN Amelia Jo, MD      . umeclidinium-vilanterol [facility] ELLIPTA) 62.5-25 MCG/INH 1 puff  1 puff Inhalation PRN Amelia Jo, MD       . vancomycin (VANCOCIN) 1,250 mg in sodium chloride 0.9 % 250 mL IVPB  1,250 mg Intravenous Q24H Amelia Jo, MD   Stopped at 03/12/18 0510      .  PHYSICAL EXAMINATION:  Vitals:   03/12/18 0700 03/12/18 1030  BP:  (!) 132/47  Pulse:  88  Resp:    Temp:  (!) 102 F (38.9 C)  SpO2: 100% 100%   Filed Weights   03/11/18 1343 03/12/18 0024 03/12/18 0439  Weight: 230 lb (104.3 kg) 230 lb 2.6 oz (104.4 kg) 231 lb 0.7 oz (104.8 kg)    GENERAL: Well-nourished well-developed; Alert, no distress and comfortable.   Alone.  Obese. EYES: no pallor or icterus OROPHARYNX: no thrush or ulceration. NECK: supple, no masses felt LYMPH:  no palpable lymphadenopathy in the cervical, axillary or inguinal regions LUNGS: decreased breath sounds to auscultation at bases and  No wheeze or crackles HEART/CVS: regular rate & rhythm and no murmurs; No lower extremity edema ABDOMEN: abdomen soft, non-tender and normal bowel sounds Musculoskeletal:no cyanosis of digits and no clubbing  PSYCH: alert & oriented x 3 with fluent speech NEURO: no focal motor/sensory deficits SKIN: Multiple-chronic bruises noted.  LABORATORY DATA:  I have reviewed the data as listed Lab Results  Component Value Date   WBC 5.5 03/12/2018   HGB 7.9 (L) 03/12/2018   HCT 24.0 (L) 03/12/2018   MCV 99.9 03/12/2018   PLT PLATELET CLUMPS NOTED ON SMEAR, UNABLE TO ESTIMATE 03/12/2018   Recent Labs    07/17/17 1229  10/01/17 0811  02/12/18 1030  03/08/18 1620 03/11/18 1630 03/12/18 0418  NA 137   < > 139   < > 144   < > 138 143 143  K 3.6   < > 4.1   < > 4.8   < > 4.4 4.3 4.0  CL 103   < > 106   < >  109   < > 108 111 111  CO2 24   < > 27   < > 26   < > '22 26 27  '$ GLUCOSE 114*   < > 120*   < > 112*   < > 162* 124* 127*  BUN 16   < > 24*   < > 22*   < > '17 19 17  '$ CREATININE 1.37*   < > 1.24*   < > 1.17*   < > 1.29* 1.34* 1.18*  CALCIUM 9.2   < > 8.8*   < > 8.8*   < > 8.5* 9.0 8.3*  GFRNONAA 37*   < > 41*   < > 44*    < > 39* 37* 44*  GFRAA 43*   < > 48*   < > 51*   < > 45* 43* 51*  PROT 8.0   < > 6.7  --  6.9  --   --  7.4  --   ALBUMIN 3.6   < > 3.9  --  3.8  --   --  4.0  --   AST 29   < > 16  --  18  --   --  42*  --   ALT 19   < > 13*  --  10*  --   --  184*  --   ALKPHOS 77   < > 63  --  63  --   --  141*  --   BILITOT 1.2   < > 1.1  --  1.4*  --   --  1.4*  --   BILIDIR 0.1  --   --   --   --   --   --   --   --   IBILI 1.1*  --   --   --   --   --   --   --   --    < > = values in this interval not displayed.    RADIOGRAPHIC STUDIES: I have personally reviewed the radiological images as listed and agreed with the findings in the report. Dg Chest 2 View  Result Date: 03/11/2018 CLINICAL DATA:  Acute shortness of breath. Patient with leukemia currently on chemotherapy. EXAM: CHEST - 2 VIEW COMPARISON:  03/08/2018 abdominal CT and prior studies FINDINGS: Mild cardiomegaly and LEFT IJ Port-A-Cath with tip overlying the mid SVC again noted. Multiple ill-defined nodular opacities throughout both lungs are present. Possible trace effusions noted. There is no evidence of pneumothorax. No acute bony abnormalities are present. IMPRESSION: Multiple ill-defined nodular opacities throughout both, nonspecific but most likely represents infection. Other treatment related changes or leukemic infiltrates are considerations. Consider chest CT with contrast for further evaluation. Electronically Signed   By: Margarette Canada M.D.   On: 03/11/2018 14:19   Ct Chest W Contrast  Result Date: 03/11/2018 CLINICAL DATA:  77 year old female with shortness of breath. Currently on chemotherapy for leukemia. EXAM: CT CHEST WITH CONTRAST TECHNIQUE: Multidetector CT imaging of the chest was performed during intravenous contrast administration. CONTRAST:  68m OMNIPAQUE IOHEXOL 300 MG/ML  SOLN COMPARISON:  03/11/2018 and prior chest radiographs. 03/08/2018 abdominal CT and 08/25/2017 chest CT FINDINGS: Cardiovascular: Heart size normal.  Coronary artery and thoracic aortic atherosclerotic calcifications again identified. A LEFT-sided Port-A-Cath is identified with tip at the SUPERIOR cavoatrial junction. No aortic aneurysm or pericardial effusion. Mediastinum/Nodes: No enlarged mediastinal, hilar, or axillary lymph nodes. Thyroid gland, trachea, and esophagus demonstrate no  significant findings. Lungs/Pleura: Multiple ill-defined nodular opacities scattered throughout both lungs noted. The LOWER lung opacities appear relatively unchanged from 03/08/2018. These nodular opacities are new since 08/25/2017. A trace RIGHT pleural effusion is noted. No pneumothorax. Upper Abdomen: Hepatosplenomegaly again noted. A 2.1 x 2.5 cm soft tissue mass in the LEFT anterior abdominal subcutaneous tissues is unchanged. Musculoskeletal: No chest wall abnormality. No acute or significant osseous findings. IMPRESSION: 1. Multiple ill-defined nodular opacities scattered throughout both lungs, nonspecific but most likely representing infectious etiologies (typical and atypical) given patient's history of leukemia on chemotherapy. Leukemic infiltrates or lymphoproliferative disease not excluded. 2. Unchanged 2.1 x 2.5 cm soft tissue mass in the LEFT anterior abdominal subcutaneous tissues. Malignancy not excluded. 3. Hepatosplenomegaly 4. Coronary artery and Aortic Atherosclerosis (ICD10-I70.0). Electronically Signed   By: Margarette Canada M.D.   On: 03/11/2018 18:07   Ct Renal Stone Study  Result Date: 03/08/2018 CLINICAL DATA:  Left flank pain. History of kidney stones. Currently on chemotherapy for leukemia. EXAM: CT ABDOMEN AND PELVIS WITHOUT CONTRAST TECHNIQUE: Multidetector CT imaging of the abdomen and pelvis was performed following the standard protocol without IV contrast. COMPARISON:  CT abdomen pelvis dated July 17, 2017. FINDINGS: Lower chest: Diffuse perilymphatic and peribronchovascular flame shaped nodular opacities at the lung bases. Hepatobiliary:  Scattered calcified granulomas within the liver are unchanged. No new focal liver abnormality. Status post cholecystectomy. Mild common bile duct and intrahepatic biliary dilatation is similar to prior study. Pancreas: Unremarkable. No pancreatic ductal dilatation or surrounding inflammatory changes. Spleen: Stable to slight interval decrease in size of the spleen, now measuring 13.1 x 7.3 x 13.8 cm (volume of 659 cc, previously 710 cc). Multiple splenic granulomas are unchanged. Adrenals/Urinary Tract: The adrenal glands are unremarkable. Stable bilateral renal cysts. There are multiple new bilateral renal calculi measuring up to 1.3 cm. There are multiple small layering calculi at the left UVJ with resultant mild left hydroureteronephrosis. The bladder is unremarkable. Stomach/Bowel: Small hiatal hernia, unchanged. The stomach is otherwise within normal limits. No bowel wall thickening, distention, or surrounding inflammatory changes. Prior appendectomy. Vascular/Lymphatic: Aortic atherosclerosis. Interval decrease in size of the lymph node at the diaphragmatic hiatus, now measuring 10 mm in short axis, previously 1.6 cm. Scattered subcentimeter retroperitoneal mesenteric lymph nodes are unchanged. Reproductive: Status post hysterectomy. No adnexal masses. Other: Interval enlargement of the nonspecific left anterior upper abdominal wall soft tissue nodule now measuring 2.1 x 2.4 cm, previously 2.1 x 1.7 cm. No abdominopelvic ascites. No pneumoperitoneum. Musculoskeletal: No acute or significant osseous findings. IMPRESSION: 1. Multiple small layering calculi at the left UVJ with resultant mild left hydroureteronephrosis. 2. Multiple new bilateral renal calculi measuring up to 1.3 cm. 3. Diffuse perilymphatic and peribronchovascular flame shaped nodular opacities at the lung bases, suspicious for atypical infection given currently on chemotherapy. 4. Stable to slightly improved splenomegaly. 5. Decreased epigastric  lymph node, now measuring 10 mm in short axis, previously 16 mm. 6. Interval enlargement of the nonspecific left anterior upper abdominal wall soft tissue nodule, now measuring 2.1 x 2.4 cm, previously 2.1 x 1.7 cm. 7.  Aortic atherosclerosis (ICD10-I70.0). Electronically Signed   By: Titus Dubin M.D.   On: 03/08/2018 17:47    ASSESSMENT & PLAN:   #77 year old female patient with a history of acute myelogenous leukemia-currently on IDH inhibitor-is currently admitted to hospital for worsening shortness of breath/cough  # Worsening shortness of breath with cough/fever-given the CT findings of bilateral multiple groundglass opacities-highly suspicious for atypical infection/pneumonia.  Given the  history of acute myeloid leukemia, the patient is immunosuppressed-high risk for atypical infections.  Agree with broad-spectrum antibiotics.  If fevers do not improve-recommend ID evaluation/consultation.  #Acute myelogenous leukemia-currently on IDH inhibitor [recently on hold because of elevated LFTs-see discussion below].  Based on my discussion with Dr. Royce Macadamia at [facility] for progression of leukemia.  I would like to confirm with a peripheral blood flow cytometry for further confirmation.  Anemia secondary to #1  #Anemia secondary to AML-hemoglobin 7.8; recommend 1 unit of PRBC transfusion; hold platelet transfusion at this time  #Bilateral flank pain left more than right-secondary to left UVJ obstruction/hydronephrosis-pain under control.  Recently evaluated by urology outpatient.  If pain gets worse; recommend evaluation while in the hospital.  #Recently elevated LFTs-transaminitis with[up to 500]-question doxycycline versus others- Ena.  Currently improving.  Monitor for now.  #CODE STATUS: Agree with DNR/DNI.  Overall prognosis is guarded  # I reviewed the blood work- with the patient in detail; also reviewed the imaging independently [as summarized above]; and with the patient in detail.    All questions were answered. The patient knows to call the clinic with any problems, questions or concerns.    Cammie Sickle, MD 03/12/2018 10:52 AM

## 2018-03-12 NOTE — Progress Notes (Addendum)
[place] [facility] [place] NAME: Teresa Carrillo    MR#:  [id number]  DATE OF BIRTH:  02/17/1941  SUBJECTIVE: Admitted for shortness of breath found to have bilateral infiltrates on CT chest, treating her for possible pneumonia.  Patient states that shE is feeling slightly better.  Patient been followed by Dr. Rogue Bussing and also [facility] fO myelodysplastic syndrome that converted to acute myeloid leukemia and gettingR oral chemotherapy.  Patient oral chemotherapy stopped a week ago secondary to concern for elevated LFTs.  Patient LFTs are checked on admission showed ALT 184 but normal AST.  Patient still hypoxic and requiring 3 L of oxygen.  Feels more short of breath with minimal ambulation.  CHIEF COMPLAINT: Shortness of breath  REVIEW OF SYSTEMS:   ROS CONSTITUTIONAL: No fever, has some fatigue and shortness of breath eYES: No blurred or double vision.  EARS, NOSE, AND THROAT: No tinnitus or ear pain.  RESPIRATORY: Shortness of breath.  No cough. CARDIOVASCULAR: No chest pain, orthopnea, edema.  GASTROINTESTINAL: No nausea, vomiting, diarrhea or abdominal pain.  GENITOURINARY: No dysuria, hematuria.  ENDOCRINE: No polyuria, nocturia,  HEMATOLOGY: No anemia, easy bruising or bleeding SKIN: No rash or lesion. MUSCULOSKELETAL: No joint pain or arthritis.   NEUROLOGIC: No tingling, numbness, weakness.  PSYCHIATRY: No anxiety or depression.   DRUG ALLERGIES:   Allergies  Allergen Reactions  . Macrobid [employer] Macro] Other (See Comments)    Reaction: unknown    VITALS:  Blood pressure (!) 132/47, pulse 88, temperature (!) 102 F (38.9 C), temperature source Oral, resp. rate 20, height 5\' 3"  (1.6 m), weight 104.8 kg (231 lb 0.7 oz), SpO2 100 %.  PHYSICAL EXAMINATION:  GENERAL:  77 y.o.-year-old patient lying in the bed with no acute distress.  Patient appears well-nourished despite her cancer. EYES: Pupils equal, round,  reactive to light and accommodation. No scleral icterus. Extraocular muscles intact.  HEENT: Head atraumatic, normocephalic. Oropharynx and nasopharynx clear.  NECK:  Supple, no jugular venous distention. No thyroid enlargement, no tenderness.  LUNGS: Normal breath sounds bilaterally, no wheezing, rales,rhonchi or crepitation. No use of accessory muscles of respiration.  CARDIOVASCULAR: S1, S2 normal. No murmurs, rubs, or gallops.  ABDOMEN: Soft, nontender, nondistended. Bowel sounds present. No organomegaly or mass.  EXTREMITIES: No pedal edema, cyanosis, or clubbing.  NEUROLOGIC: Cranial nerves II through XII are intact. Muscle strength 5/5 in all extremities. Sensation intact. Gait not checked.  PSYCHIATRIC: The patient is alert and oriented x 3.  SKIN: No obvious rash, lesion, or ulcer.    LABORATORY PANEL:   CBC Recent Labs  Lab 03/12/18 0418  WBC 5.5  HGB 7.9*  HCT 24.0*  PLT PLATELET CLUMPS NOTED ON SMEAR, UNABLE TO ESTIMATE   ------------------------------------------------------------------------------------------------------------------  Chemistries  Recent Labs  Lab 03/11/18 1630 03/12/18 0418  NA 143 143  K 4.3 4.0  CL 111 111  CO2 26 27  GLUCOSE 124* 127*  BUN 19 17  CREATININE 1.34* 1.18*  CALCIUM 9.0 8.3*  AST 42*  --   ALT 184*  --   ALKPHOS 141*  --   BILITOT 1.4*  --    ------------------------------------------------------------------------------------------------------------------  Cardiac Enzymes No results for input(s): TROPONINI in the last 168 hours. ------------------------------------------------------------------------------------------------------------------  RADIOLOGY:  Dg Chest 2 View  Result Date: 03/11/2018 CLINICAL DATA:  Acute shortness of breath. Patient with leukemia currently on chemotherapy. EXAM: CHEST - 2 VIEW COMPARISON:  03/08/2018 abdominal CT and prior studies FINDINGS: Mild cardiomegaly and  LEFT IJ Port-A-Cath with tip  overlying the mid SVC again noted. Multiple ill-defined nodular opacities throughout both lungs are present. Possible trace effusions noted. There is no evidence of pneumothorax. No acute bony abnormalities are present. IMPRESSION: Multiple ill-defined nodular opacities throughout both, nonspecific but most likely represents infection. Other treatment related changes or leukemic infiltrates are considerations. Consider chest CT with contrast for further evaluation. Electronically Signed   By: Margarette Canada M.D.   On: 03/11/2018 14:19   Ct Chest W Contrast  Result Date: 03/11/2018 CLINICAL DATA:  77 year old female with shortness of breath. Currently on chemotherapy for leukemia. EXAM: CT CHEST WITH CONTRAST TECHNIQUE: Multidetector CT imaging of the chest was performed during intravenous contrast administration. CONTRAST:  79mL OMNIPAQUE IOHEXOL 300 MG/ML  SOLN COMPARISON:  03/11/2018 and prior chest radiographs. 03/08/2018 abdominal CT and 08/25/2017 chest CT FINDINGS: Cardiovascular: Heart size normal. Coronary artery and thoracic aortic atherosclerotic calcifications again identified. A LEFT-sided Port-A-Cath is identified with tip at the SUPERIOR cavoatrial junction. No aortic aneurysm or pericardial effusion. Mediastinum/Nodes: No enlarged mediastinal, hilar, or axillary lymph nodes. Thyroid gland, trachea, and esophagus demonstrate no significant findings. Lungs/Pleura: Multiple ill-defined nodular opacities scattered throughout both lungs noted. The LOWER lung opacities appear relatively unchanged from 03/08/2018. These nodular opacities are new since 08/25/2017. A trace RIGHT pleural effusion is noted. No pneumothorax. Upper Abdomen: Hepatosplenomegaly again noted. A 2.1 x 2.5 cm soft tissue mass in the LEFT anterior abdominal subcutaneous tissues is unchanged. Musculoskeletal: No chest wall abnormality. No acute or significant osseous findings. IMPRESSION: 1. Multiple ill-defined nodular opacities  scattered throughout both lungs, nonspecific but most likely representing infectious etiologies (typical and atypical) given patient's history of leukemia on chemotherapy. Leukemic infiltrates or lymphoproliferative disease not excluded. 2. Unchanged 2.1 x 2.5 cm soft tissue mass in the LEFT anterior abdominal subcutaneous tissues. Malignancy not excluded. 3. Hepatosplenomegaly 4. Coronary artery and Aortic Atherosclerosis (ICD10-I70.0). Electronically Signed   By: Margarette Canada M.D.   On: 03/11/2018 18:07    EKG:   Orders placed or performed during the hospital encounter of 03/11/18  . ED EKG  . ED EKG  . ED EKG  . ED EKG    ASSESSMENT AND PLAN:   77 year old female patient with history of myelodysplastic syndrome that is converted to acute myeloid leukemia and getting treatment at [facility] and also Dr. Rogue Bussing was on oral chemotherapy but it is on hold secondary to concern for elevated LFTs, now comes with shortness of breath that started suddenly yesterday found to have infiltrates bilaterally on CT chest,  #1. acute respiratory failure with hypoxia bilateral pneumonia:even though o2 sats better,pt feels short of breath with minimal am CT chest concerning for atypical infection, pneumonia: Secondary to treating empirically with IV antibiotics for possible pneumonia, patient had normal white count.  Appreciate Dr. Rogue Bussing following on this.  Continue oxygen.  Patient had fever at home and still has fever up to 102 Fahrenheit today request ID consult. 2.  Anemia, thrombocytopenia secondary to leukemia, chemotherapy.  Hemoglobin stable around 7.9, platelets around 32.  No active bleeding.  Hold off on blood transfusion. 3.  Acute renal failure: Improved. 4.  Bilateral flank pain left more than the right, patient has left UVJ obstruction, hydronephrosis, followed by urology as an outpatient, flank pain no more patient has bilateral renal stones, followed by urology as an outpatient, patient may not  be able to get treated using QRS as they are quite numerous stones, patient may need left ureteral stent  placement and more stable.  5.  Acute myeloid leukemia since 3 6 months patient started on oral chemotherapy at [facility] but on hold secondary to elevated LFTs.  Patient has 19% blasts in peripheral blood smear concerning for progression of leukemia.  6/recent transaminitis: Clinically improving.  Likely due to drug-induced with doxycycline versus a now.  Patient took doxycycline for dental work-up.   All the records are reviewed and case discussed with Care Management/Social Workerr. Management plans discussed with the patient, family and they are in agreement.  CODE STATUS: DNR  TOTAL TIME TAKING CARE OF THIS PATIENT: 38  POSSIBLE D/C IN 1-2DAYS, DEPENDING ON CLINICAL CONDITION.  Than 50% of the time spent in counseling, coordination, discussing the charts and also discussing with Dr. Oneita Jolly and also discussing with patient. Epifanio Lesches M.D on 03/12/2018 at 10:46 AM  Between 7am to 6pm - Pager - [phone]  After 6pm go to [email or web address] - password [email or web address] [facility]  Office  [phone]  CC: Primary care physician; Arnetha Courser, MD   Note: This dictation was prepared with Dragon dictation along with smaller phrase technology. Any transcriptional errors that result from this process are unintentional.

## 2018-03-13 ENCOUNTER — Inpatient Hospital Stay: Payer: Medicare Other

## 2018-03-13 ENCOUNTER — Telehealth: Payer: Self-pay

## 2018-03-13 ENCOUNTER — Inpatient Hospital Stay: Payer: Medicare Other | Admitting: Internal Medicine

## 2018-03-13 LAB — BPAM RBC
BLOOD PRODUCT EXPIRATION DATE: 201907172359
ISSUE DATE / TIME: 201907041704
Unit Type and Rh: 600

## 2018-03-13 LAB — CBC
HCT: 28 % — ABNORMAL LOW (ref 35.0–47.0)
Hemoglobin: 9.5 g/dL — ABNORMAL LOW (ref 12.0–16.0)
MCH: 32.4 pg (ref 26.0–34.0)
MCHC: 34 g/dL (ref 32.0–36.0)
MCV: 95.4 fL (ref 80.0–100.0)
PLATELETS: 33 10*3/uL — AB (ref 150–440)
RBC: 2.94 MIL/uL — AB (ref 3.80–5.20)
RDW: 20.7 % — AB (ref 11.5–14.5)
WBC: 8.8 10*3/uL (ref 3.6–11.0)

## 2018-03-13 LAB — TYPE AND SCREEN
ABO/RH(D): A POS
Antibody Screen: NEGATIVE
UNIT DIVISION: 0

## 2018-03-13 LAB — GLUCOSE, CAPILLARY: GLUCOSE-CAPILLARY: 129 mg/dL — AB (ref 70–99)

## 2018-03-13 MED ORDER — NON FORMULARY: 100.0000 mg | Freq: Every day | Status: DC

## 2018-03-13 MED ORDER — ENASIDENIB MESYLATE 50 MG PO TABS
100.0000 mg | ORAL_TABLET | Freq: Every day | ORAL | Status: DC
  Filled 2018-03-13: qty 2

## 2018-03-13 MED ORDER — BUPROPION HCL ER (XL) 300 MG PO TB24
300.0000 mg | ORAL_TABLET | Freq: Every day | ORAL | Status: DC
  Administered 2018-03-13 – 2018-03-15 (×3): 300 mg via ORAL
  Filled 2018-03-13 (×3): qty 1

## 2018-03-13 NOTE — Unmapped (Signed)
Left vm about patient appt date/times    Thanks

## 2018-03-13 NOTE — Unmapped (Signed)
Jessica Wilson, please schedule pt with Dr Malen Gauze on 7/22, labs prior. She is currently inpt, but if you could let her know with a vm that would be great.  Marijean Niemann

## 2018-03-13 NOTE — Progress Notes (Signed)
Teresa Carrillo   DOB:09/23/1940   SE#:[id number]    Subjective: Patient continues to feel overall poorly.  Had a fever yesterday.  Abdominal pain improved.  No nausea vomiting.  No bleeding gums.  PRBC transfusion uneventful.  Objective:  Vitals:   03/13/18 1312 03/13/18 1428  BP:  123/61  Pulse:  98  Resp:  (!) 24  Temp:  98.4 F (36.9 C)  SpO2: 94% 99%     Intake/Output Summary (Last 24 hours) at 03/13/2018 1957 Last data filed at 03/13/2018 1803 Gross per 24 hour  Intake 3015 ml  Output 2000 ml  Net 1015 ml    GENERAL Alert, no distress and comfortable.on O2 [place] 2-3L.  EYES: no pallor or icterus OROPHARYNX: no thrush or ulceration. NECK: supple, no masses felt LYMPH:  no palpable lymphadenopathy in the cervical, axillary or inguinal regions LUNGS: decreased breath sounds to auscultation at bases and  No wheeze or crackles HEART/CVS: regular rate & rhythm and no murmurs; No lower extremity edema ABDOMEN: abdomen soft, tender  on deep palpation. and normal bowel sounds Musculoskeletal:no cyanosis of digits and no clubbing  PSYCH: alert & oriented x 3 with fluent speech NEURO: no focal motor/sensory deficits SKIN:  no rashes or significant lesions   Labs:  Lab Results  Component Value Date   WBC 8.8 03/13/2018   HGB 9.5 (L) 03/13/2018   HCT 28.0 (L) 03/13/2018   MCV 95.4 03/13/2018   PLT 33 (L) 03/13/2018   NEUTROABS 3.6 03/11/2018    Lab Results  Component Value Date   NA 143 03/12/2018   NA 143 03/12/2018   K 4.0 03/12/2018   K 4.1 03/12/2018   CL 111 03/12/2018   CL 112 (H) 03/12/2018   CO2 27 03/12/2018   CO2 25 03/12/2018    Studies:  Dg Chest Port 1 View  Result Date: 03/13/2018 CLINICAL DATA:  Shortness of breath. EXAM: PORTABLE CHEST 1 VIEW COMPARISON:  Chest x-ray 03/11/2018.  CT 03/11/2018. FINDINGS: PowerPort catheter with tip over the superior vena cava. Stable cardiomegaly. Multiple ill-defined pulmonary nodular densities are noted throughout both  lung fields. Reference is made to prior CT report of 03/11/2018 for further discussion. No pleural effusion or pneumothorax. Calcifications in the left axilla consistent calcified axillary lymph nodes. No acute bony abnormality. IMPRESSION: 1.  PowerPort catheter in stable position. 2. Multiple ill-defined pulmonary nodules again noted without interim change. Reference is made to prior CT report of 03/11/2018. 3. Mild cardiomegaly. No pulmonary venous congestion. Electronically Signed   By: Marcello Moores  Register   On: 03/13/2018 12:05    Assessment & Plan:   # 77 year old female patient with a history of acute myelogenous leukemia-currently on IDH inhibitor-is currently admitted to hospital for worsening shortness of breath/cough  # Worsening shortness of breath with cough/fever-given the CT findings of bilateral multiple groundglass opacities-highly suspicious for atypical infection/pneumonia-fevers not resolved.  Continue broad-spectrum antibiotics azithromycin plus vancomycin plus cefepime.  Leukemic infiltrate/fevers unlikely; however diagnosis of exclusion.    #Acute myelogenous leukemia-currently on IDH inhibitor [Most recently held because of elevated LFTs] as peripheral smear evaluation at [facility]  is suspected.  Peripheral blood flow cytometry pending.  Given the improvement of LFTs would recommend starting- Enadisenib. Discussed with Dr.Foster.   #Anemia secondary to AML-status post PRBC transfusion 9.5. platelets 30s hold  Platelet transfusion.  Reviewed the  #Bilateral flank pain left more than right-secondary to left UVJ obstruction/hydronephrosis-pain under control.  Reviewed the urology recommendations.  Urine unlikely source of  patient's infection.  #Prognosis is guarded.  Dr.Rao on call over the weekend for any urgent concerns.  Cammie Sickle, MD 03/13/2018  7:57 PM

## 2018-03-13 NOTE — Progress Notes (Signed)
[place] at [place] NAME: Teresa Carrillo    MR#:  [id number]  DATE OF BIRTH:  1941-07-29  SUBJECTIVE: Admitted for shortness of breath, atypical pneumonia.  Patient feeling more short of breath requiring oxygen 5 L.  Patient O2 saturation is in upper 70s on 2 L so increased oxygen to 5 L.  Patient has more shortness of breath.  No fever.  No abdominal pain.  Passing some sediments in   Urine.  CHIEF COMPLAINT: Shortness of breath  REVIEW OF SYSTEMS:   ROS CONSTITUTIONAL: No fever, has some fatigue and shortness of breath eYES: No blurred or double vision.  EARS, NOSE, AND THROAT: No tinnitus or ear pain.  RESPIRATORY: Worsening shortness of breath ,higher amount of oxygen cARDIOVASCULAR: No chest pain, orthopnea, edema.  GASTROINTESTINAL: No nausea, vomiting, diarrhea or abdominal pain.  GENITOURINARY: No dysuria, hematuria.  ENDOCRINE: No polyuria, nocturia,  HEMATOLOGY: No anemia, easy bruising or bleeding SKIN: No rash or lesion. MUSCULOSKELETAL: No joint pain or arthritis.   NEUROLOGIC: No tingling, numbness, weakness.  PSYCHIATRY: No anxiety or depression.   DRUG ALLERGIES:   Allergies  Allergen Reactions  . Macrobid [employer] Macro] Other (See Comments)    Reaction: unknown    VITALS:  Blood pressure (!) 150/82, pulse 90, temperature 98.4 F (36.9 C), temperature source Oral, resp. rate 19, height 5\' 3"  (1.6 m), weight 106.4 kg (234 lb 9.1 oz), SpO2 96 %.  PHYSICAL EXAMINATION:  GENERAL:  77 y.o.-year-old patient lying in the bed with no acute distress.  Patient appears well-nourished despite her cancer. EYES: Pupils equal, round, reactive to light and accommodation. No scleral icterus. Extraocular muscles intact.  HEENT: Head atraumatic, normocephalic. Oropharynx and nasopharynx clear.  NECK:  Supple, no jugular venous distention. No thyroid enlargement, no tenderness.  LUNGS: decreased breath sounds  bilaterally respiration.  CARDIOVASCULAR: S1, S2 normal. No murmurs, rubs, or gallops.  ABDOMEN: Soft, nontender, nondistended. Bowel sounds present. No organomegaly or mass.  EXTREMITIES: No pedal edema, cyanosis, or clubbing.  NEUROLOGIC: Cranial nerves II through XII are intact. Muscle strength 5/5 in all extremities. Sensation intact. Gait not checked.  PSYCHIATRIC: The patient is alert and oriented x 3.  SKIN: No obvious rash, lesion, or ulcer.    LABORATORY PANEL:   CBC Recent Labs  Lab 03/13/18 0507  WBC 8.8  HGB 9.5*  HCT 28.0*  PLT 33*   ------------------------------------------------------------------------------------------------------------------  Chemistries  Recent Labs  Lab 03/11/18 1630 03/12/18 0418  NA 143 143  143  K 4.3 4.1  4.0  CL 111 112*  111  CO2 26 25  27   GLUCOSE 124* 128*  127*  BUN 19 16  17   CREATININE 1.34* 1.27*  1.18*  CALCIUM 9.0 8.1*  8.3*  AST 42*  --   ALT 184*  --   ALKPHOS 141*  --   BILITOT 1.4*  --    ------------------------------------------------------------------------------------------------------------------  Cardiac Enzymes No results for input(s): TROPONINI in the last 168 hours. ------------------------------------------------------------------------------------------------------------------  RADIOLOGY:  Dg Chest 2 View  Result Date: 03/11/2018 CLINICAL DATA:  Acute shortness of breath. Patient with leukemia currently on chemotherapy. EXAM: CHEST - 2 VIEW COMPARISON:  03/08/2018 abdominal CT and prior studies FINDINGS: Mild cardiomegaly and LEFT IJ Port-A-Cath with tip overlying the mid SVC again noted. Multiple ill-defined nodular opacities throughout both lungs are present. Possible trace effusions noted. There is no evidence of pneumothorax. No acute bony abnormalities are present. IMPRESSION: Multiple ill-defined nodular  opacities throughout both, nonspecific but most likely represents infection. Other  treatment related changes or leukemic infiltrates are considerations. Consider chest CT with contrast for further evaluation. Electronically Signed   By: Margarette Canada M.D.   On: 03/11/2018 14:19   Ct Chest W Contrast  Result Date: 03/11/2018 CLINICAL DATA:  77 year old female with shortness of breath. Currently on chemotherapy for leukemia. EXAM: CT CHEST WITH CONTRAST TECHNIQUE: Multidetector CT imaging of the chest was performed during intravenous contrast administration. CONTRAST:  51mL OMNIPAQUE IOHEXOL 300 MG/ML  SOLN COMPARISON:  03/11/2018 and prior chest radiographs. 03/08/2018 abdominal CT and 08/25/2017 chest CT FINDINGS: Cardiovascular: Heart size normal. Coronary artery and thoracic aortic atherosclerotic calcifications again identified. A LEFT-sided Port-A-Cath is identified with tip at the SUPERIOR cavoatrial junction. No aortic aneurysm or pericardial effusion. Mediastinum/Nodes: No enlarged mediastinal, hilar, or axillary lymph nodes. Thyroid gland, trachea, and esophagus demonstrate no significant findings. Lungs/Pleura: Multiple ill-defined nodular opacities scattered throughout both lungs noted. The LOWER lung opacities appear relatively unchanged from 03/08/2018. These nodular opacities are new since 08/25/2017. A trace RIGHT pleural effusion is noted. No pneumothorax. Upper Abdomen: Hepatosplenomegaly again noted. A 2.1 x 2.5 cm soft tissue mass in the LEFT anterior abdominal subcutaneous tissues is unchanged. Musculoskeletal: No chest wall abnormality. No acute or significant osseous findings. IMPRESSION: 1. Multiple ill-defined nodular opacities scattered throughout both lungs, nonspecific but most likely representing infectious etiologies (typical and atypical) given patient's history of leukemia on chemotherapy. Leukemic infiltrates or lymphoproliferative disease not excluded. 2. Unchanged 2.1 x 2.5 cm soft tissue mass in the LEFT anterior abdominal subcutaneous tissues. Malignancy not  excluded. 3. Hepatosplenomegaly 4. Coronary artery and Aortic Atherosclerosis (ICD10-I70.0). Electronically Signed   By: Margarette Canada M.D.   On: 03/11/2018 18:07   Dg Chest Port 1 View  Result Date: 03/13/2018 CLINICAL DATA:  Shortness of breath. EXAM: PORTABLE CHEST 1 VIEW COMPARISON:  Chest x-ray 03/11/2018.  CT 03/11/2018. FINDINGS: PowerPort catheter with tip over the superior vena cava. Stable cardiomegaly. Multiple ill-defined pulmonary nodular densities are noted throughout both lung fields. Reference is made to prior CT report of 03/11/2018 for further discussion. No pleural effusion or pneumothorax. Calcifications in the left axilla consistent calcified axillary lymph nodes. No acute bony abnormality. IMPRESSION: 1.  PowerPort catheter in stable position. 2. Multiple ill-defined pulmonary nodules again noted without interim change. Reference is made to prior CT report of 03/11/2018. 3. Mild cardiomegaly. No pulmonary venous congestion. Electronically Signed   By: Marcello Moores  Register   On: 03/13/2018 12:05    EKG:   Orders placed or performed during the hospital encounter of 03/11/18  . ED EKG  . ED EKG  . ED EKG  . ED EKG    ASSESSMENT AND PLAN:   77 year old female patient with history of myelodysplastic syndrome that is converted to acute myeloid leukemia and getting treatment at [facility] and also Dr. Rogue Bussing was on oral chemotherapy but it is on hold secondary to concern for elevated LFTs, now comes with shortness of breath that started suddenly yesterday found to have infiltrates bilaterally on CT chest,  #1. acute respiratory failure with hypoxia bilateral pneumonia: Worsening hypoxia today, chest x-ray repeated showed multiple bilateral infiltrates did not show anything different.  Continue IV antibiotics, oxygen, add bronchodilators, oxygen No respiratory distress and hypoxia today, chest x-ray did not show any edema.  We discontinued IV fluids, encourage p.o. intake.   2.  Anemia,  thrombocytopenia secondary to leukemia, chemotherapy.  Hemoglobin stable around 7.9, platelets around  32.  No active bleeding.  Status post 1 unit of packed RBC transfusion.  Hemoglobin improved from 7.9-9.5.     3.  Acute renal failure: Improved.  4.  Left ureter calculus, hydronephrosis, seen by urology, started on Flomax. Patient was kept n.p.o. after midnight for possible urological intervention with left ureteral stent but patient refused to have urological procedure.  Patient afebrile.  She wanted to eat.   5.  Acute myeloid leukemia since 6 months patient started on oral chemotherapy at [facility] but on hold secondary to elevated LFTs.  Patient has 19% blasts in peripheral blood smear concerning for progression of leukemia.  6//recent transaminitis: Clinically improving.  Likely due to drug-induced with doxycycline versus  chemotherapy: Patient LFTs are better now.   All the records are reviewed and case discussed with Care Management/Social Workerr. Management plans discussed with the patient, family and they are in agreement.  CODE STATUS: DNR  TOTAL TIME TAKING CARE OF THIS PATIENT: 86  POSSIBLE D/C IN 1-2DAYS, DEPENDING ON CLINICAL CONDITION.  Than 50% of the time spent in counseling, coordination, discussing the charts and also discussing with Dr. Oneita Jolly and also discussing with patient. Epifanio Lesches M.D on 03/13/2018 at 12:46 PM  Between 7am to 6pm - Pager - [phone]  After 6pm go to [email or web address] - password [email or web address] [place] [facility]  Office  [phone]  CC: Primary care physician; Arnetha Courser, MD   Note: This dictation was prepared with Dragon dictation along with smaller phrase technology. Any transcriptional errors that result from this process are unintentional.

## 2018-03-13 NOTE — Telephone Encounter (Signed)
-----   Message from Nori Riis, PA-C sent at 03/13/2018  8:06 AM EDT ----- Please check on Teresa Carrillo urine culture results on Monday.  She is currently admitted for pneumonia.

## 2018-03-14 ENCOUNTER — Inpatient Hospital Stay: Payer: Medicare Other

## 2018-03-14 DIAGNOSIS — N132 Hydronephrosis with renal and ureteral calculous obstruction: Secondary | ICD-10-CM

## 2018-03-14 DIAGNOSIS — R1032 Left lower quadrant pain: Secondary | ICD-10-CM

## 2018-03-14 DIAGNOSIS — R509 Fever, unspecified: Secondary | ICD-10-CM

## 2018-03-14 LAB — BASIC METABOLIC PANEL
ANION GAP: 10 (ref 5–15)
BUN: 27 mg/dL — ABNORMAL HIGH (ref 8–23)
CO2: 23 mmol/L (ref 22–32)
Calcium: 8.4 mg/dL — ABNORMAL LOW (ref 8.9–10.3)
Chloride: 106 mmol/L (ref 98–111)
Creatinine, Ser: 1.38 mg/dL — ABNORMAL HIGH (ref 0.44–1.00)
GFR, EST AFRICAN AMERICAN: 42 mL/min — AB (ref >60)
GFR, EST NON AFRICAN AMERICAN: 36 mL/min — AB (ref >60)
Glucose, Bld: 145 mg/dL — ABNORMAL HIGH (ref 70–99)
Potassium: 3.9 mmol/L (ref 3.5–5.1)
Sodium: 139 mmol/L (ref 135–145)

## 2018-03-14 LAB — CBC
HCT: 24.1 % — ABNORMAL LOW (ref 35.0–47.0)
Hemoglobin: 8.2 g/dL — ABNORMAL LOW (ref 12.0–16.0)
MCH: 32.1 pg (ref 26.0–34.0)
MCHC: 33.9 g/dL (ref 32.0–36.0)
MCV: 94.7 fL (ref 80.0–100.0)
PLATELETS: 33 10*3/uL — AB (ref 150–440)
RBC: 2.55 MIL/uL — AB (ref 3.80–5.20)
RDW: 21.2 % — ABNORMAL HIGH (ref 11.5–14.5)
WBC: 6.6 10*3/uL (ref 3.6–11.0)

## 2018-03-14 LAB — CULTURE, URINE COMPREHENSIVE

## 2018-03-14 LAB — GLUCOSE, CAPILLARY: GLUCOSE-CAPILLARY: 122 mg/dL — AB (ref 70–99)

## 2018-03-14 MED ORDER — ENASIDENIB MESYLATE 100 MG PO TABS
100.0000 mg | ORAL_TABLET | Freq: Every day | ORAL | Status: DC
  Administered 2018-03-14: 100 mg via ORAL

## 2018-03-14 NOTE — Progress Notes (Signed)
I have reviewed her CT scan of the pelvis which demonstrates passage of all her stones that were within the left distal ureter.  There were some small stones in the bladder but no hydro ureter.  Patient's nurse captured a stone in the strainer early this PM.  I have order a stone analysis on this.  The patient needs no acute intervention for the remainder of her non-obstructing stones.  Once she is healthier we may consider intervention, but no interventions this hospitalization.  She will follow-up as scheduled at [facility].

## 2018-03-14 NOTE — Progress Notes (Deleted)
Pt has not urinated since the night of 7/5. This nurse has NT Katie bladder scan pt. Pt is holding 1163mL urine in bladder at this time. MD notified. MD ordered in and out cath.

## 2018-03-14 NOTE — Progress Notes (Signed)
[facility] NAME: Teresa Carrillo    MR#:  [id number]  DATE OF BIRTH:  1940-09-30  SUBJECTIVE:  admitted for shortness of breath, hypoxia found to have pneumonia /, patient complains of hematuria today and also flank pain.  CHIEF COMPLAINT: Shortness of breath  REVIEW OF SYSTEMS:   ROS CONSTITUTIONAL: No fever, has some fatigue and shortness of breath  eYES: No blurred or double vision.  EARS, NOSE, AND THROAT: No tinnitus or ear pain.  RESPIRATORY: Shortness of breath, hypoxia cARDIOVASCULAR: No chest pain, orthopnea, edema.  GASTROINTESTINAL: No nausea, vomiting, diarrhea or abdominal pain.  GENITOURINARY: Hematuria, flank pain today ENDOCRINE: No polyuria, nocturia,  HEMATOLOGY: No anemia, easy bruising or bleeding SKIN: No rash or lesion. MUSCULOSKELETAL: No joint pain or arthritis.   NEUROLOGIC: No tingling, numbness, weakness.  PSYCHIATRY: No anxiety or depression.   DRUG ALLERGIES:   Allergies  Allergen Reactions  . Macrobid [employer] Macro] Other (See Comments)    Reaction: unknown    VITALS:  Blood pressure (!) 113/46, pulse 96, temperature 98.5 F (36.9 C), temperature source Oral, resp. rate 20, height 5\' 3"  (1.6 m), weight 107.4 kg (236 lb 11.2 oz), SpO2 99 %.  PHYSICAL EXAMINATION:  GENERAL:  77 y.o.-year-old patient lying in the bed with no acute distress.  Patient appears well-nourished despite her cancer. EYES: Pupils equal, round, reactive to light and accommodation. No scleral icterus. Extraocular muscles intact.  HEENT: Head atraumatic, normocephalic. Oropharynx and nasopharynx clear.  NECK:  Supple, no jugular venous distention. No thyroid enlargement, no tenderness.  LUNGS: decreased breath sounds bilaterally respiration.  CARDIOVASCULAR: S1, S2 normal. No murmurs, rubs, or gallops.  ABDOMEN: Soft, nontender, nondistended. Bowel sounds present. No organomegaly or mass.  No CVA  tenderness. EXTREMITIES: No pedal edema, cyanosis, or clubbing.  NEUROLOGIC: Cranial nerves II through XII are intact. Muscle strength 5/5 in all extremities. Sensation intact. Gait not checked.  PSYCHIATRIC: The patient is alert and oriented x 3.  SKIN: No obvious rash, lesion, or ulcer.    LABORATORY PANEL:   CBC Recent Labs  Lab 03/13/18 0507  WBC 8.8  HGB 9.5*  HCT 28.0*  PLT 33*   ------------------------------------------------------------------------------------------------------------------  Chemistries  Recent Labs  Lab 03/11/18 1630 03/12/18 0418  NA 143 143  143  K 4.3 4.1  4.0  CL 111 112*  111  CO2 26 25  27   GLUCOSE 124* 128*  127*  BUN 19 16  17   CREATININE 1.34* 1.27*  1.18*  CALCIUM 9.0 8.1*  8.3*  AST 42*  --   ALT 184*  --   ALKPHOS 141*  --   BILITOT 1.4*  --    ------------------------------------------------------------------------------------------------------------------  Cardiac Enzymes No results for input(s): TROPONINI in the last 168 hours. ------------------------------------------------------------------------------------------------------------------  RADIOLOGY:  Dg Chest Port 1 View  Result Date: 03/13/2018 CLINICAL DATA:  Shortness of breath. EXAM: PORTABLE CHEST 1 VIEW COMPARISON:  Chest x-ray 03/11/2018.  CT 03/11/2018. FINDINGS: PowerPort catheter with tip over the superior vena cava. Stable cardiomegaly. Multiple ill-defined pulmonary nodular densities are noted throughout both lung fields. Reference is made to prior CT report of 03/11/2018 for further discussion. No pleural effusion or pneumothorax. Calcifications in the left axilla consistent calcified axillary lymph nodes. No acute bony abnormality. IMPRESSION: 1.  PowerPort catheter in stable position. 2. Multiple ill-defined pulmonary nodules again noted without interim change. Reference is made to prior CT report of 03/11/2018. 3. Mild cardiomegaly. No pulmonary  venous  congestion. Electronically Signed   By: Marcello Moores  Register   On: 03/13/2018 12:05    EKG:   Orders placed or performed during the hospital encounter of 03/11/18  . ED EKG  . ED EKG  . ED EKG  . ED EKG    ASSESSMENT AND PLAN:   77 year old female patient with history of myelodysplastic syndrome that is converted to acute myeloid leukemia and getting treatment at [facility] and also Dr. Rogue Bussing was on oral chemotherapy but it is on hold secondary to concern for elevated LFTs, now comes with shortness of breath that started suddenly yesterday found to have infiltrates bilaterally on CT chest,  #1. acute respiratory failure with hypoxia bilateral pneumonia: Continue IV vancomycin, cefepime, azithromycin, oxygen.  Chest x-ray repeated yesterday did not show anything new changes, seen by oncology, recommend treating for pneumonia does not think at this time patient has leukemic infiltrate.  2.  Anemia, thrombocytopenia secondary to leukemia, chemotherapy.  Hemoglobin stable around 7.9, platelets around 32.  No active bleeding.  Status post 1 unit of packed RBC transfusion.  Hemoglobin improved from 7.9-9.5.     3.  Acute renal failure: Improved.  4.  Left ureter calculus, hydronephrosis, seen by urology, started on Flomax.  does have some hematuria today and flank pain.  Reconsult urology to follow-up on that.   5.  Acute myeloid leukemia since 6 months patient started on oral chemotherapy at [facility] but on hold secondary to elevated LFTs.  Patient has 19% blasts in peripheral blood smear concerning for progression of leukemia.  6//recent transaminitis: Clinically improving.  Likely due to drug-induced with doxycycline versus  chemotherapy: Patient LFTs are better now.  Restart chemotherapy as per oncology recommendation.   All the records are reviewed and case discussed with Care Management/Social Workerr. Management plans discussed with the patient, family and they are in agreement.  CODE  STATUS: DNR  TOTAL TIME TAKING CARE OF THIS PATIENT: 4  POSSIBLE D/C IN 1-2DAYS, DEPENDING ON CLINICAL CONDITION.  Than 50% of the time spent in counseling, coordination of care.  Epifanio Lesches M.D on 03/14/2018 at 11:13 AM  Between 7am to 6pm - Pager - [phone]  After 6pm go to [email or web address] - password [email or web address] [place]  pitalists  Office  [phone]: Primary care physician; Arnetha Courser, MD   Note: This dictation was prepared with Dragon dictation along with smaller phrase technology. Any transcriptional errors that result from this process are unintentional.

## 2018-03-14 NOTE — Progress Notes (Signed)
Urology Inpatient Progress Report  Abnormal chest CT [R93.89] Dyspnea, unspecified type [R06.00]  We are following the patient for obstructing left distal ureteral stones.  At this point, the patient is being managed conservatively given her other associated comorbidities and her lack of symptoms.  Intv/Subj: Overnight the patient developed suprapubic pain and pressure.  She felt as if she had to void but could not.  She has been voiding very small amounts.  Subsequently was cathed and a large volume of urine returned.  She is now having pressure in her bladder,  And the feelings that she has to void more frequently.  A lot of the pain that she was experiencing has dissipated.  The patient denies any fevers or chills.  She is not having dysuria.  Active Problems:   HCAP (healthcare-associated pneumonia)  Current Facility-Administered Medications  Medication Dose Route Frequency Provider Last Rate Last Dose  . acetaminophen (TYLENOL) tablet 650 mg  650 mg Oral Q6H PRN Amelia Jo, MD   650 mg at 03/13/18 2234   Or  . acetaminophen (TYLENOL) suppository 650 mg  650 mg Rectal Q6H PRN Amelia Jo, MD      . azithromycin [facility]) tablet 250 mg  250 mg Oral Daily Epifanio Lesches, MD   250 mg at 03/13/18 1725  . bisacodyl (DULCOLAX) EC tablet 5 mg  5 mg Oral Daily PRN Amelia Jo, MD      . buPROPion (WELLBUTRIN XL) 24 hr tablet 300 mg  300 mg Oral Daily Epifanio Lesches, MD   300 mg at 03/14/18 0930  . ceFEPIme (MAXIPIME) 2 g in sodium chloride 0.9 % 100 mL IVPB  2 g Intravenous Q12H Amelia Jo, MD   Stopped at 03/14/18 1002  . chlorhexidine (PERIDEX) 0.12 % solution 15 mL  15 mL Mouth Rinse BID Amelia Jo, MD   15 mL at 03/14/18 0930  . clonazePAM (KLONOPIN) tablet 0.5 mg  0.5 mg Oral BID Amelia Jo, MD   0.5 mg at 03/14/18 0931  . diltiazem (CARDIZEM CD) 24 hr capsule 180 mg  180 mg Oral Daily Amelia Jo, MD   180 mg at 03/14/18 0931  . docusate sodium (COLACE)  capsule 100 mg  100 mg Oral BID Amelia Jo, MD   100 mg at 03/14/18 0931  . Enasidenib Mesylate TABS 100 mg  100 mg Oral Daily Epifanio Lesches, MD      . heparin lock flush 100 unit/mL  500 Units Intracatheter Daily PRN Charlaine Dalton R, MD      . heparin lock flush 100 unit/mL  250 Units Intracatheter PRN Cammie Sickle, MD      . HYDROcodone-acetaminophen (NORCO/VICODIN) 5-325 MG per tablet 1-2 tablet  1-2 tablet Oral Q4H PRN Amelia Jo, MD   2 tablet at 03/14/18 0931  . ipratropium-albuterol (DUONEB) 0.5-2.5 (3) MG/3ML nebulizer solution 3 mL  3 mL Nebulization TID Epifanio Lesches, MD   3 mL at 03/14/18 1458  . lidocaine-prilocaine (EMLA) cream 1 application  1 application Topical PRN Amelia Jo, MD      . ondansetron [facility]) tablet 4 mg  4 mg Oral Q6H PRN Amelia Jo, MD       Or  . ondansetron [facility]) injection 4 mg  4 mg Intravenous Q6H PRN Amelia Jo, MD   4 mg at 03/13/18 2243  . oxyCODONE (Oxy IR/ROXICODONE) immediate release tablet 5 mg  5 mg Oral Q8H PRN Amelia Jo, MD   5 mg at 03/14/18 0416  . sertraline (ZOLOFT) tablet  100 mg  100 mg Oral Q1200 Amelia Jo, MD   100 mg at 03/14/18 0934  . sodium chloride flush (NS) 0.9 % injection 10 mL  10 mL Intracatheter PRN Charlaine Dalton R, MD      . sodium chloride flush (NS) 0.9 % injection 3 mL  3 mL Intracatheter PRN Charlaine Dalton R, MD      . tamsulosin (FLOMAX) capsule 0.4 mg  0.4 mg Oral Daily Amelia Jo, MD   0.4 mg at 03/14/18 0931  . traZODone (DESYREL) tablet 25 mg  25 mg Oral QHS PRN Amelia Jo, MD      . umeclidinium-vilanterol [facility] ELLIPTA) 62.5-25 MCG/INH 1 puff  1 puff Inhalation PRN Amelia Jo, MD   1 puff at 03/14/18 0930  . vancomycin (VANCOCIN) 1,250 mg in sodium chloride 0.9 % 250 mL IVPB  1,250 mg Intravenous Q24H Amelia Jo, MD   Stopped at 03/14/18 0433     Objective: Vital: Vitals:   03/14/18 0730 03/14/18 0928 03/14/18 1500 03/14/18 1511  BP:  (!)  113/46  (!) 105/40  Pulse:  96  71  Resp:      Temp:  98.5 F (36.9 C)  97.9 F (36.6 C)  TempSrc:  Oral  Oral  SpO2: 96% 99% 97% 96%  Weight:      Height:       I/Os: I/O last 3 completed shifts: In: 3015 [P.O.:960; I.V.:875; Blood:380; IV Piggyback:800] Out: 2000 [Urine:2000]  Physical Exam:  General: Patient is in no apparent distress, morbidly obese Lungs: Normal respiratory effort, chest expands symmetrically. GI: The abdomen is soft and nontender without mass.  She does have some suprapubic tenderness to palpation Ext: lower extremities symmetric  Lab Results: Recent Labs    03/12/18 0418 03/13/18 0507 03/14/18 1130  WBC 5.5 8.8 6.6  HGB 7.9* 9.5* 8.2*  HCT 24.0* 28.0* 24.1*   Recent Labs    03/11/18 1630 03/12/18 0418 03/14/18 1130  NA 143 143  143 139  K 4.3 4.1  4.0 3.9  CL 111 112*  111 106  CO2 26 25  27 23   GLUCOSE 124* 128*  127* 145*  BUN 19 16  17  27*  CREATININE 1.34* 1.27*  1.18* 1.38*  CALCIUM 9.0 8.1*  8.3* 8.4*   No results for input(s): LABPT, INR in the last 72 hours. No results for input(s): LABURIN in the last 72 hours. Results for orders placed or performed during the hospital encounter of 03/11/18  Culture, blood (routine x 2)     Status: None (Preliminary result)   Collection Time: 03/11/18  5:10 PM  Result Value Ref Range Status   Specimen Description BLOOD LEFT ANTECUBITAL  Final   Special Requests Blood Culture adequate volume  Final   Culture   Final    NO GROWTH 3 DAYS Performed at [facility], [address] [place], [place] [address]    Report Status PENDING  Incomplete  Culture, blood (routine x 2)     Status: None (Preliminary result)   Collection Time: 03/11/18  5:10 PM  Result Value Ref Range Status   Specimen Description BLOOD RIGHT ANTECUBITAL  Final   Special Requests Blood Culture adequate volume  Final   Culture   Final    NO GROWTH 3 DAYS Performed at [facility], [address] [place], [place] [address]    Report Status PENDING  Incomplete    Studies/Results: Dg Chest Port 1 View  Result Date: 03/13/2018  CLINICAL DATA:  Shortness of breath. EXAM: PORTABLE CHEST 1 VIEW COMPARISON:  Chest x-ray 03/11/2018.  CT 03/11/2018. FINDINGS: PowerPort catheter with tip over the superior vena cava. Stable cardiomegaly. Multiple ill-defined pulmonary nodular densities are noted throughout both lung fields. Reference is made to prior CT report of 03/11/2018 for further discussion. No pleural effusion or pneumothorax. Calcifications in the left axilla consistent calcified axillary lymph nodes. No acute bony abnormality. IMPRESSION: 1.  PowerPort catheter in stable position. 2. Multiple ill-defined pulmonary nodules again noted without interim change. Reference is made to prior CT report of 03/11/2018. 3. Mild cardiomegaly. No pulmonary venous congestion. Electronically Signed   By: Marcello Moores  Register   On: 03/13/2018 12:05   I independently reviewed the patient's CT scan was performed on 03/08/2018 which showed left distal ureteral stones, likely uric acid.  She had nonobstructing stones bilaterally.  Assessment: The patient has left distal obstructing stones in her ureter which appeared to have progressed down into the bladder wall given her urinary frequency and suprapubic pressure.  She may have passed some of her stone burden, but is difficult to tell.  Her last imaging study was nearly a week ago.  Fortunately, most of her pain is resolved.  She does have progressive urinary tract symptoms which are bothering her.  She has no evidence or signs of infection currently.    Plan: I have ordered a CT of the pelvis to reevaluate the stone burden in the distal ureter and made her NPO past midnight.  Ideally we would manage this conservatively given that she is otherwise afebrile and tolerating the obstructed left kidney quite well.  Ureteroscopy and stent placement is an option, but she  would be better off if she could pass the stones on her own.  I suspect that she is going to pass them shortly given that she now has signs of progression of the stones down into the bladder wall.  I will reevaluate the patient tomorrow morning and make a decision as to whether we need to proceed with more aggressive intervention.    Louis Meckel, MD Urology 03/14/2018, 3:39 PM

## 2018-03-14 NOTE — Progress Notes (Signed)
Pt BP 105/40. NT took BP twice to verify. MD notified. No new orders at this time. Will continue to monitor.

## 2018-03-15 ENCOUNTER — Inpatient Hospital Stay: Payer: Medicare Other

## 2018-03-15 ENCOUNTER — Ambulatory Visit
Admit: 2018-03-15 | Discharge: 2018-03-23 | Disposition: A | Discharge status: Skilled Nursing Facility | Payer: MEDICARE | Source: Other Acute Inpatient Hospital | Admitting: Hematology & Oncology

## 2018-03-15 DIAGNOSIS — R918 Other nonspecific abnormal finding of lung field: Principal | ICD-10-CM

## 2018-03-15 DIAGNOSIS — R52 Pain, unspecified: Secondary | ICD-10-CM | POA: Diagnosis not present

## 2018-03-15 DIAGNOSIS — Z452 Encounter for adjustment and management of vascular access device: Secondary | ICD-10-CM | POA: Diagnosis not present

## 2018-03-15 DIAGNOSIS — J9600 Acute respiratory failure, unspecified whether with hypoxia or hypercapnia: Secondary | ICD-10-CM | POA: Diagnosis not present

## 2018-03-15 DIAGNOSIS — E861 Hypovolemia: Secondary | ICD-10-CM | POA: Diagnosis not present

## 2018-03-15 DIAGNOSIS — C9000 Multiple myeloma not having achieved remission: Secondary | ICD-10-CM | POA: Diagnosis not present

## 2018-03-15 DIAGNOSIS — R279 Unspecified lack of coordination: Secondary | ICD-10-CM | POA: Diagnosis not present

## 2018-03-15 DIAGNOSIS — R0902 Hypoxemia: Secondary | ICD-10-CM | POA: Diagnosis not present

## 2018-03-15 DIAGNOSIS — J441 Chronic obstructive pulmonary disease with (acute) exacerbation: Secondary | ICD-10-CM | POA: Diagnosis not present

## 2018-03-15 DIAGNOSIS — I10 Essential (primary) hypertension: Secondary | ICD-10-CM | POA: Diagnosis not present

## 2018-03-15 DIAGNOSIS — J154 Pneumonia due to other streptococci: Secondary | ICD-10-CM | POA: Diagnosis not present

## 2018-03-15 DIAGNOSIS — J13 Pneumonia due to Streptococcus pneumoniae: Secondary | ICD-10-CM | POA: Diagnosis not present

## 2018-03-15 DIAGNOSIS — C9202 Acute myeloblastic leukemia, in relapse: Secondary | ICD-10-CM | POA: Diagnosis not present

## 2018-03-15 DIAGNOSIS — Z9221 Personal history of antineoplastic chemotherapy: Secondary | ICD-10-CM | POA: Diagnosis not present

## 2018-03-15 DIAGNOSIS — G4733 Obstructive sleep apnea (adult) (pediatric): Secondary | ICD-10-CM | POA: Diagnosis not present

## 2018-03-15 DIAGNOSIS — J189 Pneumonia, unspecified organism: Secondary | ICD-10-CM | POA: Diagnosis not present

## 2018-03-15 DIAGNOSIS — Z9989 Dependence on other enabling machines and devices: Secondary | ICD-10-CM | POA: Diagnosis not present

## 2018-03-15 DIAGNOSIS — C9200 Acute myeloblastic leukemia, not having achieved remission: Secondary | ICD-10-CM | POA: Diagnosis not present

## 2018-03-15 DIAGNOSIS — J188 Other pneumonia, unspecified organism: Secondary | ICD-10-CM | POA: Diagnosis not present

## 2018-03-15 DIAGNOSIS — I517 Cardiomegaly: Secondary | ICD-10-CM | POA: Diagnosis not present

## 2018-03-15 DIAGNOSIS — J9601 Acute respiratory failure with hypoxia: Secondary | ICD-10-CM | POA: Diagnosis not present

## 2018-03-15 DIAGNOSIS — I480 Paroxysmal atrial fibrillation: Secondary | ICD-10-CM | POA: Diagnosis not present

## 2018-03-15 DIAGNOSIS — R5381 Other malaise: Secondary | ICD-10-CM | POA: Diagnosis not present

## 2018-03-15 DIAGNOSIS — J439 Emphysema, unspecified: Secondary | ICD-10-CM | POA: Diagnosis not present

## 2018-03-15 DIAGNOSIS — N179 Acute kidney failure, unspecified: Secondary | ICD-10-CM | POA: Diagnosis not present

## 2018-03-15 DIAGNOSIS — N189 Chronic kidney disease, unspecified: Secondary | ICD-10-CM | POA: Diagnosis not present

## 2018-03-15 DIAGNOSIS — R0602 Shortness of breath: Secondary | ICD-10-CM | POA: Diagnosis not present

## 2018-03-15 DIAGNOSIS — Z66 Do not resuscitate: Secondary | ICD-10-CM | POA: Diagnosis not present

## 2018-03-15 DIAGNOSIS — E785 Hyperlipidemia, unspecified: Secondary | ICD-10-CM | POA: Diagnosis not present

## 2018-03-15 DIAGNOSIS — R0600 Dyspnea, unspecified: Secondary | ICD-10-CM | POA: Diagnosis not present

## 2018-03-15 DIAGNOSIS — Z87891 Personal history of nicotine dependence: Secondary | ICD-10-CM | POA: Diagnosis not present

## 2018-03-15 DIAGNOSIS — I129 Hypertensive chronic kidney disease with stage 1 through stage 4 chronic kidney disease, or unspecified chronic kidney disease: Secondary | ICD-10-CM | POA: Diagnosis not present

## 2018-03-15 DIAGNOSIS — J440 Chronic obstructive pulmonary disease with acute lower respiratory infection: Secondary | ICD-10-CM | POA: Diagnosis not present

## 2018-03-15 DIAGNOSIS — D469 Myelodysplastic syndrome, unspecified: Secondary | ICD-10-CM | POA: Diagnosis not present

## 2018-03-15 DIAGNOSIS — E1122 Type 2 diabetes mellitus with diabetic chronic kidney disease: Secondary | ICD-10-CM | POA: Diagnosis not present

## 2018-03-15 DIAGNOSIS — R319 Hematuria, unspecified: Secondary | ICD-10-CM | POA: Diagnosis not present

## 2018-03-15 DIAGNOSIS — D696 Thrombocytopenia, unspecified: Secondary | ICD-10-CM | POA: Diagnosis not present

## 2018-03-15 DIAGNOSIS — Z9049 Acquired absence of other specified parts of digestive tract: Secondary | ICD-10-CM | POA: Diagnosis not present

## 2018-03-15 DIAGNOSIS — M6281 Muscle weakness (generalized): Secondary | ICD-10-CM | POA: Diagnosis not present

## 2018-03-15 LAB — VANCOMYCIN, TROUGH: VANCOMYCIN TR: 18 ug/mL (ref 15–20)

## 2018-03-15 LAB — GLUCOSE, CAPILLARY: GLUCOSE-CAPILLARY: 121 mg/dL — AB (ref 70–99)

## 2018-03-15 LAB — COMPREHENSIVE METABOLIC PANEL
ALBUMIN: 3.2 g/dL — ABNORMAL LOW (ref 3.5–5.0)
ALKALINE PHOSPHATASE: 133 U/L — ABNORMAL HIGH (ref 38–126)
ALT (SGPT): 52 U/L — ABNORMAL HIGH (ref 15–48)
ANION GAP: 10 mmol/L (ref 9–15)
AST (SGOT): 24 U/L (ref 14–38)
BILIRUBIN TOTAL: 1.1 mg/dL (ref 0.0–1.2)
BLOOD UREA NITROGEN: 38 mg/dL — ABNORMAL HIGH (ref 7–21)
BUN / CREAT RATIO: 25
CALCIUM: 8.6 mg/dL (ref 8.5–10.2)
CHLORIDE: 104 mmol/L (ref 98–107)
CREATININE: 1.55 mg/dL — ABNORMAL HIGH (ref 0.60–1.00)
EGFR CKD-EPI AA FEMALE: 37 mL/min/{1.73_m2} — ABNORMAL LOW (ref >=60)
EGFR CKD-EPI NON-AA FEMALE: 32 mL/min/{1.73_m2} — ABNORMAL LOW (ref >=60)
GLUCOSE RANDOM: 207 mg/dL — ABNORMAL HIGH (ref 65–179)
POTASSIUM: 3.9 mmol/L (ref 3.5–5.0)
PROTEIN TOTAL: 6.1 g/dL — ABNORMAL LOW (ref 6.5–8.3)
SODIUM: 137 mmol/L (ref 135–145)

## 2018-03-15 LAB — URINALYSIS
BILIRUBIN UA: NEGATIVE
GLUCOSE UA: NEGATIVE
LEUKOCYTE ESTERASE UA: NEGATIVE
NITRITE UA: NEGATIVE
PH UA: 5 (ref 5.0–9.0)
PROTEIN UA: 30 — AB
RBC UA: 7 /HPF — ABNORMAL HIGH (ref <=4)
SPECIFIC GRAVITY UA: 1.01 (ref 1.003–1.030)
SQUAMOUS EPITHELIAL: <1 /HPF (ref 0–5)
UROBILINOGEN UA: 0.2
WBC UA: <1 /HPF (ref 0–5)

## 2018-03-15 LAB — ALT (SGPT): Alanine aminotransferase:CCnc:Pt:Ser/Plas:Qn:: 52 — ABNORMAL HIGH

## 2018-03-15 LAB — FIBRINOGEN LEVEL: Lab: 704 — ABNORMAL HIGH

## 2018-03-15 LAB — LACTATE DEHYDROGENASE
LACTATE DEHYDROGENASE: 912 U/L — ABNORMAL HIGH (ref 338–610)
Lactate dehydrogenase:CCnc:Pt:Ser/Plas:Qn:: 912 — ABNORMAL HIGH

## 2018-03-15 LAB — URIC ACID: Urate:MCnc:Pt:Ser/Plas:Qn:: 10.7 — ABNORMAL HIGH

## 2018-03-15 LAB — FIBRINOGEN: FIBRINOGEN LEVEL: 704 mg/dL — ABNORMAL HIGH (ref 177–386)

## 2018-03-15 LAB — SPECIFIC GRAVITY UA: Lab: 1.01

## 2018-03-15 LAB — PRO-BNP: Natriuretic peptide.B prohormone N-Terminal:MCnc:Pt:Ser/Plas:Qn:: 2800 — ABNORMAL HIGH

## 2018-03-15 LAB — PROTIME: Lab: 14 — ABNORMAL HIGH

## 2018-03-15 LAB — APTT: Coagulation surface induced:Time:Pt:PPP:Qn:Coag: 38.2

## 2018-03-15 MED ORDER — ONDANSETRON HCL 4 MG PO TABS: 4.0000 mg | ORAL_TABLET | Freq: Four times a day (QID) | ORAL | 0 refills | Status: AC | PRN

## 2018-03-15 MED ORDER — FUROSEMIDE 10 MG/ML IJ SOLN
40.0000 mg | Freq: Once | INTRAMUSCULAR | Status: AC
  Administered 2018-03-15: 40 mg via INTRAVENOUS
  Filled 2018-03-15: qty 4

## 2018-03-15 MED ORDER — METHYLPREDNISOLONE SODIUM SUCC 125 MG IJ SOLR
125.0000 mg | Freq: Once | INTRAMUSCULAR | Status: DC
Start: 2018-03-15 — End: 2018-03-15

## 2018-03-15 MED ORDER — METHYLPREDNISOLONE SODIUM SUCC 125 MG IJ SOLR
125.0000 mg | Freq: Once | INTRAMUSCULAR | Status: AC
  Administered 2018-03-15: 09:00:00 125 mg via INTRAVENOUS
  Filled 2018-03-15: qty 2

## 2018-03-15 MED ORDER — SODIUM CHLORIDE 0.9 % IV SOLN
10.0000 mg | Freq: Two times a day (BID) | INTRAVENOUS | Status: DC
  Filled 2018-03-15 (×2): qty 1

## 2018-03-15 MED ORDER — BISACODYL 5 MG PO TBEC: 5.0000 mg | DELAYED_RELEASE_TABLET | Freq: Every day | ORAL | 0 refills | Status: AC | PRN

## 2018-03-15 NOTE — Unmapped (Signed)
Hem/Onc Phone Triage Note    Caller: patient's sister, Rayann Heman    Reason for Call:   Jessica Wilson is a 77 y.o. F with AML on enasidenib (currently being held per last note). Primary oncologist: Malen Gauze. Talbert Forest is letting us know that [name] is being transferred to the ICU in [facility]. [name] is currently hospitalized with pneumonia and kidney stones.  Talbert Forest feels that she is getting worse at [facility] and thinks [name] needs to be transferred to [facility]. I stated that I cannot request a transfer from [facility] but they can request that she be transferred. I told her to ask the doctors there to call our transfer center to transfer her to [facility] at family's request. Per Dr. Mercy Riding last note, her enasidenib is currently being held. It is unclear how long her enasidenib has been held but still reasonable to entertain differentiation syndrome as a cause of of fever, dyspnea, hypoxia or other end organ issues, but I do not have the information available to me at this time to determine whether this is the case. I will route this to the fellows currently covering the transfer center and the malignant hematology service as well as patient's outpatient team.     Fellow Taking Call:  Starr Sinclair  March 15, 2018 10:23 AM

## 2018-03-15 NOTE — Unmapped (Signed)
Transfer Center Request Note    Date and Time: March 15, 2018 11:18 AM    Requesting Physician: Dr. Celene Kras: [place]    Requesting Service: Hospitalist    Reason for Transfer: Acute respiratory failure, continuity of care    Patient been to [facility] before? Yes    Brief Hospital Course:   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    OSH provider describes presentation on 7/3 with R flank pain, fever to 102 and shortness of breath. She was initially found to have right renal stones and mild hydronephrosis. CT chest also demonstrated multiple ill-defined nodules in both lungs, concerning for infection vs leukemic infiltrate. She was started on vancomycin, cefepime and azithromycin.    She has remained on 4L [place] since admission but appears more dyspneic today. She was also noted to have increased wheezing. She continues to have an oxygen saturation of 98%. Her other VS are stable with a HR 75, BP 141/53 and temperature 99.2 most recently. She has not had a true fever in >48 hours.    She has not had any hypotensive episodes. She does not have peripheral edema. She was given lasix today due to increased SOB and wheezing. She was also given solumedrol 125mg  IV.    Labs include WBC 6.6, Hgb 8.2. She did receive 1u PRBCs on admission for hgb 7.9. Plts 33. Total bilirubin 1.4. CXR shows worsened bilateral pulmonary infiltrates.    Agree with transfer to [facility] for subspecialty evaluation and continuity of care. [name] is a patient of Dr. Malen Gauze.     In addition to continued treatment for suspected PNA, it is important that differentiation syndrome remain in the differential. [name] is on enasidenib which is associated with this phenomenon. This was held ~1 week ago due to elevated LFTs. LFTs have improved and she received one dose of enasidenib last night. Treatment for differentiation syndrome includes dexamethasone 10mg  q12h.     Differentiation syndrome with agents such as enasidenib can occur with or without hyperleukocytosis. It was seen in 14% of patients on this medication in the clinical trial, with 7% of patients experiencing grade 3 or higher differentiation syndrome. The symptoms are nonspecific but include dyspnea, hypoxia and infiltrates on chest imaging. As per the package insert, treatment is with steroids, generally starting with dexamethasone 10mg  BID PO or IV.    Of note, [name] has a DNR/DNI order at the OSH. Per provider report, she desires aggressive management for any potentially reversible causes, short of intubation at this time.    Patient was discussed with Dr. Leotis Pain.    Plan Upon Arrival:   - Admit to E1  - Continue broad-spectrum antibiotics  - Continue steroids for possible differentiation syndrome (dexamethasone 10mg  q12h PO or IV)  - Please obtain CBC w diff, CMP, BNP, PT/INR, aPTT, fibrinogen  - F/u OSH cultures  - Upload OSH imaging  - Discuss whether she has source concerning for need for MRSA coverage  - Discuss whether antifungal coverage should be added  - Hold enasidenib  - May require pulmonology consult if worsens clinically    Bed Type: Floor    Accepting Service:   E1    Checklist for admission to [facility] from outside ED:  MUST MEET THE FOLLOWING VITAL PARAMATERS:  HR >60 and <120 yes  SBP >90 and <180 yes  Respirations >8 and <25 yes  O2 sat >90% on < or = to 40% or stable home O2 requirement yes (saturating 98% on 4L, stable over past 4 days)    SHOULD NOT HAVE ANY OF THE FOLLOWING (MUST ALL BE NO ANSWERS):  Concern for neurologic complications related to cancer or treatments no  Acute AMS no  Positive troponin no  Concern for leukostasis no  Severe sepsis/hemodynamic instability no  Differential includes sepsis or leukostasis no  Supportive nursing interventions at least every 2 hours no  Drips, titratable meds or meds requiring frequent monitoring no Vitals/Assessments/Labs more than every 4 hours no  Undiagnosed malignancy no  Unclear which primary service would best serve the patient no    Records of consults, imaging, pathology and discharge summary request to be faxed to [facility] fax [phone] : yes    Disk of imaging requested: yes    Please page admitting resident when patient arrives.     Fellow Triaging Request:  Lance Coon

## 2018-03-15 NOTE — Progress Notes (Addendum)
,   She has more wheezing, more shortness of breath, physical exam showed bilateral wheezing in both lungs so I have added IV steroids, bronchodilators, patient requesting to be transferred to [facility]

## 2018-03-15 NOTE — Progress Notes (Addendum)
With the [facility] hematology oncology Dr. Kary Kos who spoke with the attending recommended to give Decadron 10 mg IV every 12 hours, first dose of Decadron will be after 12 hours of her getting the Solu-Medrol, that will be tonight.  She recommended that if the transfer get the late patient to get Decadron.  No further chemotherapy is recommended so I discontinued chemotherapy medicine order.  I told patient and also patient's nurse.

## 2018-03-15 NOTE — Progress Notes (Signed)
Sent text page to hospitalist requesting diet be reinstated since NPO is no longer necessary per urologist. Previous diet was cardiac. See new order.

## 2018-03-15 NOTE — Plan of Care (Signed)
  Problem: Education: Goal: Knowledge of General Education information will improve Outcome: Progressing   Problem: Health Behavior/Discharge Planning: Goal: Ability to manage health-related needs will improve Outcome: Progressing   Problem: Clinical Measurements: Goal: Ability to maintain clinical measurements within normal limits will improve Outcome: Progressing Goal: Will remain free from infection Outcome: Progressing Goal: Diagnostic test results will improve Outcome: Progressing Goal: Cardiovascular complication will be avoided Outcome: Progressing   Problem: Activity: Goal: Risk for activity intolerance will decrease Outcome: Progressing   Problem: Nutrition: Goal: Adequate nutrition will be maintained Outcome: Progressing   Problem: Elimination: Goal: Will not experience complications related to bowel motility Outcome: Progressing Goal: Will not experience complications related to urinary retention Outcome: Progressing   Problem: Pain Managment: Goal: General experience of comfort will improve Outcome: Progressing   Problem: Safety: Goal: Ability to remain free from injury will improve Outcome: Progressing   Problem: Skin Integrity: Goal: Risk for impaired skin integrity will decrease Outcome: Progressing   Problem: Activity: Goal: Ability to tolerate increased activity will improve Outcome: Progressing   Problem: Clinical Measurements: Goal: Ability to maintain a body temperature in the normal range will improve Outcome: Progressing

## 2018-03-15 NOTE — Discharge Summary (Addendum)
Teresa Carrillo, is a 77 y.o. female  DOB 30-Apr-1941  MRN [id number].  Admission date:  03/11/2018  Admitting Physician  Amelia Jo, MD  Discharge Date:  03/15/2018   Primary MD  Arnetha Courser, MD  Recommendations for primary care physician for things to follow:   Patient being  transferred to [facility] where her primary hematologist is there   Admission Diagnosis  Abnormal chest CT [R93.89] Dyspnea, unspecified type [R06.00]   Discharge Diagnosis  Abnormal chest CT [R93.89] Dyspnea, unspecified type [R06.00]    Active Problems:   HCAP (healthcare-associated pneumonia)      Past Medical History:  Diagnosis Date  . Abdominal wall mass   . Anginal pain ([place])   . Anxiety   . Arthritis   . Calculus of kidney   . Cystitis   . Depression   . Diabetes mellitus without complication (HCC)    elevated A1c  . Dyspnea on exertion   . Elevated serum creatinine   . Fibrocystic breast disease   . GERD (gastroesophageal reflux disease)   . Hearing loss   . Heart murmur   . HTN (hypertension)   . Hyperlipidemia   . Kidney stones   . MDS (myelodysplastic syndrome), high grade ([place]) 04/23/2016  . Microscopic hematuria   . Mouth sores   . Mucositis due to chemotherapy   . Obesity   . Obesity, morbid ([place]) 09/24/2017   BMI > 35 with type 2 diabetes  . Pneumonia   . Risk for falls   . Sleep apnea   . Thrombocytopenia ([place])   . Urinary frequency   . Urinary urgency     Past Surgical History:  Procedure Laterality Date  . ABDOMINAL HYSTERECTOMY    . APPENDECTOMY    . CARDIAC CATHETERIZATION     x2  . CHOLECYSTECTOMY    . COLONOSCOPY N/A 02/24/2015   Procedure: COLONOSCOPY;  Surgeon: Manya Silvas, MD;  Location: [facility] ENDOSCOPY;  Service: Endoscopy;  Laterality: N/A;  . DIAGNOSTIC LAPAROSCOPY     Removal  of benign abdominal tumor  . ESOPHAGOGASTRODUODENOSCOPY N/A 02/24/2015   Procedure: ESOPHAGOGASTRODUODENOSCOPY (EGD);  Surgeon: Manya Silvas, MD;  Location: [facility] ENDOSCOPY;  Service: Endoscopy;  Laterality: N/A;  . IR IMAGING GUIDED PORT INSERTION  10/09/2017  . 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 Rogue Bussing comes in with shortness of breath, fever and admitted for bilateral pneumonia.   Hospital Course  1 acute respiratory failure with hypoxia secondary to bilateral pneumonia: Patient CT chest contrast on admission showed multiple ill-defined nodules scattered throughout both lungs but most likely represent infection both typical and atypical and also possible leukemic infiltrates.  Patient started on broad spectrum antibiotic vancomycin, cefepime, azithromycin for a possible pneumonia, continued on oxygen.  Patient still on 4 L of oxygen and she feels so short of breath even with minimal exertion like rolling in the bed.  Patient also noted to have some wheezing so started on bronchodilators and also IV steroids.  Patient requesting transfer to [facility] where her primary hematologist Dr. Royce Macadamia is there.  I spoke with [facility], spoke with Dr. Kary Kos, hematology oncology fellow who accepted the patient.  Patient WBC normal at 8.8 patient had fever up to 102 Fahrenheit at home but now fevers at low-grade 99.2.F  spoke with [facility] hematology oncology Dr. Kary Kos, thinking about possible differentiation syndrome.    #3 acute myeloid  leukemia, patient on IDH inhibitor was recently had a because of elevated LFTs, status post being evaluated at [facility], left suspected, patient LFTs are improved on this admission on July 3, discussed the left wrist with [facility] today.  Patient restarted on oral chemotherapy, patient took first dose last night.  3.  Anemia secondary AML: Patient hemoglobin 7.9 during this admission for which she received 1 unit of packed RBC transfusion, after transfusion patient hemoglobin stayed around 9.5 today.  Patient has thrombocytopenia with platelets around 30.  Did not require any platelet transfusion.,  #4 bilateral flank pain left more than right secondary to left ureteral stone, hydronephrosis: Patient had flank pain on June 30 rthy, emergency room for which she had CT renal stone protocol, and seen by urology, patient found to have left distal ureteral calculus, hydronephrosis on the left, patient supposed to have left ureteroscopy, lithotripsy but patient is admitted to this hospital because of worsening shortness of breath, fever so urological procedure was postponed.  Patient seen by urology Dr. Link Snuffer from [facility] urology, did not have flank pain on this admission so he recommended continue to watch , started on Flomax/ however had  Right lank pain July 6,with, hematuria for which patient had CT pelvis which showed no residual stones visualized in the distal ureter, patient likely had a transit of distal ureteral stone on the left side.  Patient nurse captured the stone in the strainer for stone analysis.  Urology said there is no acute intervention needed at this time for nonobstructing stones.  Patient does not have any flank pain today morning  5, transaminitis: Clinically improved thought to be due to drug-induced with doxycycline and also chemotherapy: LFTs are better.  Discharge Condition: Stable   Follow UP  Continue medication as per [facility] including vancomycin, cefepime,  azithromycin   Discharge Instructions  and  Discharge Medications     Discharge Instructions    Type and screen   Complete by:  Mar 12, 2018    [place] order/instruction   Complete by:  As directed    Transfuse Parameters   Complete patient signature process for consent form   Complete by:  As directed    Practitioner attestation of consent   Complete by:  As directed    I, the ordering practitioner, attest that I have discussed with the patient the benefits, risks, side effects, alternatives, likelihood of achieving goals and potential problems during recovery for the procedure listed.   Procedure:  Blood Product(s)     Allergies as of 03/15/2018      Reactions   Macrobid [nitrofurantoin Monohyd Macro] Other (See Comments)   Reaction: unknown      Medication List    TAKE these medications   ANORO ELLIPTA 62.5-25 MCG/INH Aepb Generic drug:  umeclidinium-vilanterol Inhale 1 puff into the lungs as needed.   bisacodyl 5 MG EC tablet Commonly known as:  DULCOLAX Take 1 tablet (5 mg total) by mouth daily as needed for moderate constipation.   chlorhexidine 0.12 % solution Commonly known  as:  PERIDEX 15 mLs by Mouth Rinse route 2 (two) times daily.   clonazePAM 0.5 MG tablet Commonly known as:  KLONOPIN Take 0.5 mg by mouth 2 (two) times daily.   diltiazem 180 MG 24 hr capsule Commonly known as:  CARDIZEM CD Take 1 capsule (180 mg total) by mouth daily.   lidocaine-prilocaine cream Commonly known as:  EMLA Apply 1 application topically as needed. Apply small amount to port site at least 1 hour prior to it being accessed, cover with plastic wrap   ondansetron 4 MG disintegrating tablet Commonly known as:  ZOFRAN ODT Take 1 tablet (4 mg total) by mouth every 8 (eight) hours as needed for nausea or vomiting.   ondansetron 4 MG tablet Commonly known as:  ZOFRAN Take 1 tablet (4 mg total) by mouth every 6 (six) hours as needed for  nausea.   oxyCODONE 5 MG immediate release tablet Commonly known as:  ROXICODONE Take 1 tablet (5 mg total) by mouth every 8 (eight) hours as needed.   sertraline 100 MG tablet Commonly known as:  ZOLOFT Take 100 mg by mouth daily at 12 noon.   tamsulosin 0.4 MG Caps capsule Commonly known as:  FLOMAX Take 1 capsule (0.4 mg total) by mouth daily for 7 days.         Diet and Activity recommendation: See Discharge Instructions above   Consults obtained ;hematology, oncology, urology    major procedures and Radiology Reports - PLEASE review detailed and final reports for all details, in brief -     Dg Chest 2 View  Result Date: 03/11/2018 CLINICAL DATA:  Acute shortness of breath. Patient with leukemia currently on chemotherapy. EXAM: CHEST - 2 VIEW COMPARISON:  03/08/2018 abdominal CT and prior studies FINDINGS: Mild cardiomegaly and LEFT IJ Port-A-Cath with tip overlying the mid SVC again noted. Multiple ill-defined nodular opacities throughout both lungs are present. Possible trace effusions noted. There is no evidence of pneumothorax. No acute bony abnormalities are present. IMPRESSION: Multiple ill-defined nodular opacities throughout both, nonspecific but most likely represents infection. Other treatment related changes or leukemic infiltrates are considerations. Consider chest CT with contrast for further evaluation. Electronically Signed   By: Margarette Canada M.D.   On: 03/11/2018 14:19   Ct Chest W Contrast  Result Date: 03/11/2018 CLINICAL DATA:  77 year old female with shortness of breath. Currently on chemotherapy for leukemia. EXAM: CT CHEST WITH CONTRAST TECHNIQUE: Multidetector CT imaging of the chest was performed during intravenous contrast administration. CONTRAST:  6m OMNIPAQUE IOHEXOL 300 MG/ML  SOLN COMPARISON:  03/11/2018 and prior chest radiographs. 03/08/2018 abdominal CT and 08/25/2017 chest CT FINDINGS: Cardiovascular: Heart size normal. Coronary artery and  thoracic aortic atherosclerotic calcifications again identified. A LEFT-sided Port-A-Cath is identified with tip at the SUPERIOR cavoatrial junction. No aortic aneurysm or pericardial effusion. Mediastinum/Nodes: No enlarged mediastinal, hilar, or axillary lymph nodes. Thyroid gland, trachea, and esophagus demonstrate no significant findings. Lungs/Pleura: Multiple ill-defined nodular opacities scattered throughout both lungs noted. The LOWER lung opacities appear relatively unchanged from 03/08/2018. These nodular opacities are new since 08/25/2017. A trace RIGHT pleural effusion is noted. No pneumothorax. Upper Abdomen: Hepatosplenomegaly again noted. A 2.1 x 2.5 cm soft tissue mass in the LEFT anterior abdominal subcutaneous tissues is unchanged. Musculoskeletal: No chest wall abnormality. No acute or significant osseous findings. IMPRESSION: 1. Multiple ill-defined nodular opacities scattered throughout both lungs, nonspecific but most likely representing infectious etiologies (typical and atypical) given patient's history of leukemia on chemotherapy. Leukemic infiltrates or  lymphoproliferative disease not excluded. 2. Unchanged 2.1 x 2.5 cm soft tissue mass in the LEFT anterior abdominal subcutaneous tissues. Malignancy not excluded. 3. Hepatosplenomegaly 4. Coronary artery and Aortic Atherosclerosis (ICD10-I70.0). Electronically Signed   By: Margarette Canada M.D.   On: 03/11/2018 18:07   Ct Pelvis Wo Contrast  Result Date: 03/14/2018 CLINICAL DATA:  Inpatient. Urolithiasis with distal left ureteral stones on recent CT study, presenting for follow-up. AML. EXAM: CT PELVIS WITHOUT CONTRAST TECHNIQUE: Multidetector CT imaging of the pelvis was performed following the standard protocol without intravenous contrast. COMPARISON:  03/08/2018 CT abdomen/pelvis. FINDINGS: Urinary Tract: No residual stones in the visualized distal ureters, which are normal caliber. Two punctate layering stones in the left bladder,  compatible with transit of the previously visualized distal left ureteral stones into the bladder. Bladder is nondistended. No bladder wall thickening. Redemonstration of multiple nonobstructing stones in the visualized lower kidneys bilaterally, largest 12 mm on the right and 13 mm on the left. No hydronephrosis in the visualized kidneys. Partially visualized simple 1.5 cm posterior interpolar right renal cyst. Bowel: No dilated bowel loops. Appendectomy. No bowel wall thickening. Vascular/Lymphatic: Atherosclerotic nonaneurysmal visualized lower abdominal aorta. No pathologically enlarged pelvic lymph nodes. Reproductive: Status post hysterectomy, with no abnormal findings at the vaginal cuff. No adnexal mass. Other:  No pelvic ascites, pneumoperitoneum or fluid collection. Musculoskeletal: No aggressive appearing focal osseous lesions. Prominent bilateral lower lumbar facet arthropathy. IMPRESSION: 1. No residual stones in the visualized distal ureters, which are normal caliber. Two punctate layering left bladder stones, compatible with transit of the previously visualized distal left ureteral stones into the bladder. Bladder is not significantly distended. 2. Partial visualization of nonobstructing bilateral nephrolithiasis. 3.  Aortic Atherosclerosis (ICD10-I70.0). Electronically Signed   By: Ilona Sorrel M.D.   On: 03/14/2018 16:22   Dg Chest Port 1 View  Result Date: 03/15/2018 CLINICAL DATA:  Shortness of breath and nephrolithiasis. Former smoker. EXAM: PORTABLE CHEST 1 VIEW COMPARISON:  03/13/2018; 03/11/2018; chest CT-03/11/2018 FINDINGS: Grossly unchanged enlarged cardiac silhouette. Normal mediastinal contours. Atherosclerotic plaque when the thoracic aorta. Stable position of support apparatus. Known bilateral ill-defined slightly ground-glass nodular opacities are unchanged. Pulmonary vasculature appears less distinct than present examination. There is a small amount of fluid tracking within right  minor fissure. No pneumothorax. No acute osseus abnormalities. Calcifications again overlie the left axilla. IMPRESSION: Findings worrisome for pulmonary edema superimposed on background of extensive bilateral ill-defined nodular heterogeneous airspace opacities with differential considerations again including leukemic infiltrates versus multifocal atypical infection. Electronically Signed   By: Sandi Mariscal M.D.   On: 03/15/2018 09:13   Dg Chest Port 1 View  Result Date: 03/13/2018 CLINICAL DATA:  Shortness of breath. EXAM: PORTABLE CHEST 1 VIEW COMPARISON:  Chest x-ray 03/11/2018.  CT 03/11/2018. FINDINGS: PowerPort catheter with tip over the superior vena cava. Stable cardiomegaly. Multiple ill-defined pulmonary nodular densities are noted throughout both lung fields. Reference is made to prior CT report of 03/11/2018 for further discussion. No pleural effusion or pneumothorax. Calcifications in the left axilla consistent calcified axillary lymph nodes. No acute bony abnormality. IMPRESSION: 1.  PowerPort catheter in stable position. 2. Multiple ill-defined pulmonary nodules again noted without interim change. Reference is made to prior CT report of 03/11/2018. 3. Mild cardiomegaly. No pulmonary venous congestion. Electronically Signed   By: Marcello Moores  Register   On: 03/13/2018 12:05   Ct Renal Stone Study  Result Date: 03/08/2018 CLINICAL DATA:  Left flank pain. History of kidney stones. Currently on chemotherapy for  leukemia. EXAM: CT ABDOMEN AND PELVIS WITHOUT CONTRAST TECHNIQUE: Multidetector CT imaging of the abdomen and pelvis was performed following the standard protocol without IV contrast. COMPARISON:  CT abdomen pelvis dated July 17, 2017. FINDINGS: Lower chest: Diffuse perilymphatic and peribronchovascular flame shaped nodular opacities at the lung bases. Hepatobiliary: Scattered calcified granulomas within the liver are unchanged. No new focal liver abnormality. Status post cholecystectomy. Mild  common bile duct and intrahepatic biliary dilatation is similar to prior study. Pancreas: Unremarkable. No pancreatic ductal dilatation or surrounding inflammatory changes. Spleen: Stable to slight interval decrease in size of the spleen, now measuring 13.1 x 7.3 x 13.8 cm (volume of 659 cc, previously 710 cc). Multiple splenic granulomas are unchanged. Adrenals/Urinary Tract: The adrenal glands are unremarkable. Stable bilateral renal cysts. There are multiple new bilateral renal calculi measuring up to 1.3 cm. There are multiple small layering calculi at the left UVJ with resultant mild left hydroureteronephrosis. The bladder is unremarkable. Stomach/Bowel: Small hiatal hernia, unchanged. The stomach is otherwise within normal limits. No bowel wall thickening, distention, or surrounding inflammatory changes. Prior appendectomy. Vascular/Lymphatic: Aortic atherosclerosis. Interval decrease in size of the lymph node at the diaphragmatic hiatus, now measuring 10 mm in short axis, previously 1.6 cm. Scattered subcentimeter retroperitoneal mesenteric lymph nodes are unchanged. Reproductive: Status post hysterectomy. No adnexal masses. Other: Interval enlargement of the nonspecific left anterior upper abdominal wall soft tissue nodule now measuring 2.1 x 2.4 cm, previously 2.1 x 1.7 cm. No abdominopelvic ascites. No pneumoperitoneum. Musculoskeletal: No acute or significant osseous findings. IMPRESSION: 1. Multiple small layering calculi at the left UVJ with resultant mild left hydroureteronephrosis. 2. Multiple new bilateral renal calculi measuring up to 1.3 cm. 3. Diffuse perilymphatic and peribronchovascular flame shaped nodular opacities at the lung bases, suspicious for atypical infection given currently on chemotherapy. 4. Stable to slightly improved splenomegaly. 5. Decreased epigastric lymph node, now measuring 10 mm in short axis, previously 16 mm. 6. Interval enlargement of the nonspecific left anterior upper  abdominal wall soft tissue nodule, now measuring 2.1 x 2.4 cm, previously 2.1 x 1.7 cm. 7.  Aortic atherosclerosis (ICD10-I70.0). Electronically Signed   By: Titus Dubin M.D.   On: 03/08/2018 17:47    Micro Results     Recent Results (from the past 240 hour(s))  Microscopic Examination     Status: Abnormal   Collection Time: 03/11/18 11:17 AM  Result Value Ref Range Status   WBC, UA None seen 0 - 5 /hpf Final   RBC, UA >30 (H) 0 - 2 /hpf Final   Epithelial Cells (non renal) 0-10 0 - 10 /hpf Final   Crystals Present (A) N/A Final   Crystal Type Uric Acid N/A Final   Mucus, UA Present (A) Not Estab. Final   Bacteria, UA Moderate (A) None seen/Few Final  CULTURE, URINE COMPREHENSIVE     Status: None   Collection Time: 03/11/18 11:54 AM  Result Value Ref Range Status   Urine Culture, Comprehensive Final report  Final   Organism ID, Bacteria Comment  Final    Comment: Mixed urogenital flora 25,000-50,000 colony forming units per mL   Culture, blood (routine x 2)     Status: None (Preliminary result)   Collection Time: 03/11/18  5:10 PM  Result Value Ref Range Status   Specimen Description BLOOD LEFT ANTECUBITAL  Final   Special Requests Blood Culture adequate volume  Final   Culture   Final    NO GROWTH 4 DAYS Performed at [facility]  Lab, [address] [place], [place] [address]    Report Status PENDING  Incomplete  Culture, blood (routine x 2)     Status: None (Preliminary result)   Collection Time: 03/11/18  5:10 PM  Result Value Ref Range Status   Specimen Description BLOOD RIGHT ANTECUBITAL  Final   Special Requests Blood Culture adequate volume  Final   Culture   Final    NO GROWTH 4 DAYS Performed at [facility], [address], [place], [place] [address]    Report Status PENDING  Incomplete       Today   Subjective:  Patient has shortness of breath, requesting transfer to [facility], patient will be transferred to [facility] when the bed is available  and she is stable for transfer.  Objective:   Blood pressure (!) 133/51, pulse (!) 102, temperature 99 F (37.2 C), temperature source Oral, resp. rate 19, height 5' 3"  (1.6 m), weight 107.4 kg (236 lb 11.2 oz), SpO2 97 %.   Intake/Output Summary (Last 24 hours) at 03/15/2018 1153 Last data filed at 03/15/2018 1007 Gross per 24 hour  Intake 720 ml  Output -  Net 720 ml    Exam Awake Alert, Oriented x 3, No new F.N deficits, Normal affect [place].AT,PERRAL Supple Neck,No JVD, No cervical lymphadenopathy appriciated.  Symmetrical Chest wall movement, decreased breath sound bilaterally RRR,No Gallops,Rubs or new Murmurs, No Parasternal Heave +ve B.Sounds, Abd Soft, Non tender, No organomegaly appriciated, No rebound -guarding or rigidity. No Cyanosis, Clubbing or edema, No new Rash or bruise  Data Review   CBC w Diff:  Lab Results  Component Value Date   WBC 6.6 03/14/2018   HGB 8.2 (L) 03/14/2018   HGB 8.3 (L) 02/21/2016   HCT 24.1 (L) 03/14/2018   HCT 26.2 (L) 02/21/2016   PLT 33 (L) 03/14/2018   PLT CANCELED 02/21/2016   LYMPHOPCT 36 03/11/2018   LYMPHOPCT 22.6 11/10/2012   BANDSPCT 0 03/11/2018   MONOPCT 12 03/11/2018   MONOPCT 8.6 11/10/2012   EOSPCT 0 03/11/2018   EOSPCT 1.8 11/10/2012   BASOPCT 0 03/11/2018   BASOPCT 0.7 11/10/2012    CMP:  Lab Results  Component Value Date   NA 139 03/14/2018   NA 145 (H) 05/05/2015   NA 143 11/10/2012   K 3.9 03/14/2018   K 3.1 (L) 11/10/2012   CL 106 03/14/2018   CL 111 (H) 11/10/2012   CO2 23 03/14/2018   CO2 29 11/10/2012   BUN 27 (H) 03/14/2018   BUN 20 05/05/2015   BUN 14 11/10/2012   CREATININE 1.38 (H) 03/14/2018   CREATININE 1.20 (H) 01/23/2017   PROT 7.4 03/11/2018   PROT 7.0 05/05/2015   ALBUMIN 4.0 03/11/2018   ALBUMIN 4.2 05/05/2015   BILITOT 1.4 (H) 03/11/2018   BILITOT 0.3 05/05/2015   ALKPHOS 141 (H) 03/11/2018   AST 42 (H) 03/11/2018   ALT 184 (H) 03/11/2018  .   Total Time in preparing paper  work, data evaluation and todays exam - 35 minutes  Epifanio Lesches M.D on 03/15/2018 at 11:53 AM    Note: This dictation was prepared with Dragon dictation along with smaller phrase technology. Any transcriptional errors that result from this process are unintentional.

## 2018-03-15 NOTE — Progress Notes (Signed)
Pharmacy Antibiotic Note  Teresa Carrillo is a 77 y.o. female admitted on 03/11/2018 with pneumonia.  Pharmacy has been consulted for vanc/cefepime dosing. Patient received vanc 1g and cefepime 2g IV x 1 in ED and azithromycin 500 mg IV x 1  Plan: Will start vanc 1.25g IV q24h w/ 6 hour stack  Will draw vanc trough 07/07 @ 0200 prior to 4th dose. Will continue cefepime 2g IV q12h   07/07 @ 0200 VT 18 mcg/mL therapeutic, drawn appropriately. Will continue current regimen of 1.25g IV q24h and recheck trough 07/09. Scr insignificantly rising and falling, but stable for the most part.  Ke 0.0386 T1/2 18 ~ 24 hrs Goal trough 15 - 20 mcg/mL  Height: 5\' 3"  (270 cm) Weight: 236 lb 11.2 oz (107.4 kg) IBW/kg (Calculated) : 52.4  Temp (24hrs), Avg:98.6 F (37 C), Min:97.9 F (36.6 C), Max:99.3 F (37.4 C)  Recent Labs  Lab 03/08/18 1620 03/11/18 1630 03/11/18 1710 03/11/18 1730 03/11/18 1946 03/12/18 0418 03/13/18 0507 03/14/18 1130 03/15/18 0216  WBC 4.6  --   --  7.1  --  5.5 8.8 6.6  --   CREATININE 1.29* 1.34*  --   --   --  1.27*  1.18*  --  1.38*  --   LATICACIDVEN  --   --  0.9  --  1.0  --   --   --   --   VANCOTROUGH  --   --   --   --   --   --   --   --  18    Estimated Creatinine Clearance: 40.7 mL/min (A) (by C-G formula based on SCr of 1.38 mg/dL (H)).    Allergies  Allergen Reactions  . Macrobid [employer] Macro] Other (See Comments)    Reaction: unknown    Thank you for allowing pharmacy to be a part of this patient's care.  Tobie Lords, PharmD, BCPS Clinical Pharmacist 03/15/2018

## 2018-03-15 NOTE — Progress Notes (Signed)
Talked with Urologist via phone about patient not having lithotripsy on 03/15/18. Received verbal order to discontinue NPO status at 0001 03/15/18.

## 2018-03-15 NOTE — Progress Notes (Signed)
Received call from [facility] main asking for radiology images from admission at [facility].  Spoke with radiology and they are going to "Power Share" radiology information with [facility]. Dorna Bloom RN

## 2018-03-16 ENCOUNTER — Telehealth: Payer: Self-pay | Admitting: *Deleted

## 2018-03-16 ENCOUNTER — Other Ambulatory Visit: Payer: Self-pay | Admitting: Urology

## 2018-03-16 DIAGNOSIS — R918 Other nonspecific abnormal finding of lung field: Principal | ICD-10-CM

## 2018-03-16 DIAGNOSIS — N200 Calculus of kidney: Secondary | ICD-10-CM

## 2018-03-16 LAB — COMP PANEL: LEUKEMIA/LYMPHOMA

## 2018-03-16 LAB — CULTURE, BLOOD (ROUTINE X 2)
CULTURE: NO GROWTH
Culture: NO GROWTH
SPECIAL REQUESTS: ADEQUATE
Special Requests: ADEQUATE

## 2018-03-16 LAB — CBC W/ AUTO DIFF
HEMATOCRIT: 23.8 % — ABNORMAL LOW (ref 36.0–46.0)
HEMATOCRIT: 24.2 % — ABNORMAL LOW (ref 36.0–46.0)
HEMOGLOBIN: 7.4 g/dL — ABNORMAL LOW (ref 12.0–16.0)
MEAN CORPUSCULAR HEMOGLOBIN CONC: 31 g/dL (ref 31.0–37.0)
MEAN CORPUSCULAR HEMOGLOBIN CONC: 30.5 g/dL — ABNORMAL LOW (ref 31.0–37.0)
MEAN CORPUSCULAR HEMOGLOBIN: 30.2 pg (ref 26.0–34.0)
MEAN CORPUSCULAR VOLUME: 98.8 fL (ref 80.0–100.0)
MEAN CORPUSCULAR VOLUME: 99.1 fL (ref 80.0–100.0)
MEAN PLATELET VOLUME: 9.1 fL (ref 7.0–10.0)
NUCLEATED RED BLOOD CELLS: 3 /100{WBCs} (ref <=4)
NUCLEATED RED BLOOD CELLS: 2 /100{WBCs} (ref <=4)
PLATELET COUNT: 28 10*9/L — ABNORMAL LOW (ref 150–440)
PLATELET COUNT: 30 10*9/L — ABNORMAL LOW (ref 150–440)
RED BLOOD CELL COUNT: 2.41 10*12/L — ABNORMAL LOW (ref 4.00–5.20)
RED BLOOD CELL COUNT: 2.44 10*12/L — ABNORMAL LOW (ref 4.00–5.20)
RED CELL DISTRIBUTION WIDTH: 20.3 % — ABNORMAL HIGH (ref 12.0–15.0)
RED CELL DISTRIBUTION WIDTH: 20 % — ABNORMAL HIGH (ref 12.0–15.0)
WBC ADJUSTED: 8.2 10*9/L (ref 4.5–11.0)
WBC ADJUSTED: 10.3 10*9/L (ref 4.5–11.0)

## 2018-03-16 LAB — LACTATE DEHYDROGENASE
LACTATE DEHYDROGENASE: 1081 U/L — ABNORMAL HIGH (ref 338–610)
Lactate dehydrogenase:CCnc:Pt:Ser/Plas:Qn:: 1081 — ABNORMAL HIGH
Lactate dehydrogenase:CCnc:Pt:Ser/Plas:Qn:: 1087 — ABNORMAL HIGH
Lactate dehydrogenase:CCnc:Pt:Ser/Plas:Qn:: 1507 — ABNORMAL HIGH

## 2018-03-16 LAB — COMPREHENSIVE METABOLIC PANEL
ALBUMIN: 3.1 g/dL — ABNORMAL LOW (ref 3.5–5.0)
ALKALINE PHOSPHATASE: 119 U/L (ref 38–126)
ALT (SGPT): 47 U/L (ref 15–48)
ANION GAP: 10 mmol/L (ref 9–15)
BILIRUBIN TOTAL: 1.1 mg/dL (ref 0.0–1.2)
BLOOD UREA NITROGEN: 40 mg/dL — ABNORMAL HIGH (ref 7–21)
BUN / CREAT RATIO: 29
CALCIUM: 8.3 mg/dL — ABNORMAL LOW (ref 8.5–10.2)
CHLORIDE: 106 mmol/L (ref 98–107)
CO2: 20 mmol/L — ABNORMAL LOW (ref 22.0–30.0)
CREATININE: 1.39 mg/dL — ABNORMAL HIGH (ref 0.60–1.00)
EGFR CKD-EPI AA FEMALE: 42 mL/min/{1.73_m2} — ABNORMAL LOW (ref >=60)
EGFR CKD-EPI NON-AA FEMALE: 37 mL/min/{1.73_m2} — ABNORMAL LOW (ref >=60)
GLUCOSE RANDOM: 190 mg/dL — ABNORMAL HIGH (ref 65–179)
PROTEIN TOTAL: 5.8 g/dL — ABNORMAL LOW (ref 6.5–8.3)
SODIUM: 136 mmol/L (ref 135–145)

## 2018-03-16 LAB — MANUAL DIFFERENTIAL
BASOPHILS - ABS (DIFF): 0 10*9/L (ref 0.0–0.1)
BASOPHILS - REL (DIFF): 0 %
BLASTS - REL (DIFF): 25 % (ref <=0)
BLASTS - REL (DIFF): 22 % (ref <=0)
EOSINOPHILS - ABS (DIFF): 0.1 10*9/L (ref 0.0–0.4)
EOSINOPHILS - REL (DIFF): 1 %
LYMPHOCYTES - ABS (DIFF): 1 10*9/L — ABNORMAL LOW (ref 1.5–5.0)
LYMPHOCYTES - ABS (DIFF): 0.7 10*9/L — ABNORMAL LOW (ref 1.5–5.0)
LYMPHOCYTES - REL (DIFF): 12 %
LYMPHOCYTES - REL (DIFF): 7 %
MONOCYTES - ABS (DIFF): 0.7 10*9/L (ref 0.2–0.8)
MONOCYTES - ABS (DIFF): 1.1 10*9/L — ABNORMAL HIGH (ref 0.2–0.8)
MONOCYTES - REL (DIFF): 8 %
MONOCYTES - REL (DIFF): 11 %
NEUTROPHILS - ABS (DIFF): 4.4 10*9/L (ref 2.0–7.5)
NEUTROPHILS - ABS (DIFF): 6.1 10*9/L (ref 2.0–7.5)
NEUTROPHILS - REL (DIFF): 59 %

## 2018-03-16 LAB — RED CELL DISTRIBUTION WIDTH: Lab: 20.3 — ABNORMAL HIGH

## 2018-03-16 LAB — D-DIMER QUANTITATIVE (CH,ML,PD,ET)
Lab: 7989 — ABNORMAL HIGH
Lab: 9846 — ABNORMAL HIGH

## 2018-03-16 LAB — SCHISTOCYTES

## 2018-03-16 LAB — URIC ACID
URIC ACID: 8.7 mg/dL — ABNORMAL HIGH (ref 3.0–6.5)
Urate:MCnc:Pt:Ser/Plas:Qn:: 8.7 — ABNORMAL HIGH
Urate:MCnc:Pt:Ser/Plas:Qn:: 8.9 — ABNORMAL HIGH
Urate:MCnc:Pt:Ser/Plas:Qn:: 9.8 — ABNORMAL HIGH

## 2018-03-16 LAB — MONOCYTES - REL (DIFF): Lab: 8

## 2018-03-16 LAB — PHOSPHORUS
Phosphate:MCnc:Pt:Ser/Plas:Qn:: 1.8 — ABNORMAL LOW
Phosphate:MCnc:Pt:Ser/Plas:Qn:: 2.2 — ABNORMAL LOW
Phosphate:MCnc:Pt:Ser/Plas:Qn:: 2.8 — ABNORMAL LOW
Phosphate:MCnc:Pt:Ser/Plas:Qn:: 2.8 — ABNORMAL LOW

## 2018-03-16 LAB — FIBRINOGEN LEVEL: Lab: 704 — ABNORMAL HIGH

## 2018-03-16 LAB — ALT (SGPT): Alanine aminotransferase:CCnc:Pt:Ser/Plas:Qn:: 47

## 2018-03-16 LAB — ANISOCYTOSIS

## 2018-03-16 MED ORDER — ACYCLOVIR 800 MG PO TABS
400.00 | ORAL_TABLET | ORAL | Status: DC
Start: 2018-03-23 — End: 2018-03-16

## 2018-03-16 MED ORDER — CEFEPIME HCL 2 GM/100ML IV SOLN
2.00 | INTRAVENOUS | Status: DC
Start: 2018-03-17 — End: 2018-03-16

## 2018-03-16 MED ORDER — GENERIC EXTERNAL MEDICATION
Status: DC
Start: ? — End: 2018-03-16

## 2018-03-16 MED ORDER — GENERIC EXTERNAL MEDICATION
2.00 | Status: DC
Start: ? — End: 2018-03-16

## 2018-03-16 MED ORDER — DEXAMETHASONE SODIUM PHOSPHATE 4 MG/ML IJ SOLN
10.00 | INTRAMUSCULAR | Status: DC
Start: 2018-03-16 — End: 2018-03-16

## 2018-03-16 MED ORDER — POLYETHYLENE GLYCOL 3350 17 G PO PACK
17.00 | PACK | ORAL | Status: DC
Start: ? — End: 2018-03-16

## 2018-03-16 MED ORDER — TIOTROPIUM BROMIDE MONOHYDRATE 18 MCG IN CAPS
18.00 | ORAL_CAPSULE | RESPIRATORY_TRACT | Status: DC
Start: 2018-03-24 — End: 2018-03-16

## 2018-03-16 MED ORDER — ALLOPURINOL 300 MG PO TABS
300.00 | ORAL_TABLET | ORAL | Status: DC
Start: 2018-03-24 — End: 2018-03-16

## 2018-03-16 MED ORDER — IPRATROPIUM BROMIDE 0.02 % IN SOLN
500.00 | RESPIRATORY_TRACT | Status: DC
Start: 2018-03-17 — End: 2018-03-16

## 2018-03-16 MED ORDER — AZITHROMYCIN 250 MG PO TABS
250.00 | ORAL_TABLET | ORAL | Status: DC
Start: 2018-03-17 — End: 2018-03-16

## 2018-03-16 MED ORDER — BUPROPION HCL ER (SR) 150 MG PO TB12
150.00 | ORAL_TABLET | ORAL | Status: DC
Start: 2018-03-23 — End: 2018-03-16

## 2018-03-16 MED ORDER — ONDANSETRON 4 MG PO TBDP
4.00 | ORAL_TABLET | ORAL | Status: DC
Start: ? — End: 2018-03-16

## 2018-03-16 MED ORDER — INSULIN LISPRO 100 UNIT/ML ~~LOC~~ SOLN
0.00 | SUBCUTANEOUS | Status: DC
Start: 2018-03-23 — End: 2018-03-16

## 2018-03-16 MED ORDER — ACETAMINOPHEN 325 MG PO TABS
650.00 | ORAL_TABLET | ORAL | Status: DC
Start: ? — End: 2018-03-16

## 2018-03-16 MED ORDER — SERTRALINE HCL 100 MG PO TABS
100.00 | ORAL_TABLET | ORAL | Status: DC
Start: 2018-03-24 — End: 2018-03-16

## 2018-03-16 MED ORDER — DEXTROSE 10 % IV SOLN
25.00 | INTRAVENOUS | Status: DC
Start: ? — End: 2018-03-16

## 2018-03-16 MED ORDER — ALBUTEROL SULFATE (2.5 MG/3ML) 0.083% IN NEBU
2.50 | INHALATION_SOLUTION | RESPIRATORY_TRACT | Status: DC
Start: 2018-03-17 — End: 2018-03-16

## 2018-03-16 MED ORDER — GUAIFENESIN 100 MG/5ML PO SYRP
200.00 | ORAL_SOLUTION | ORAL | Status: DC
Start: ? — End: 2018-03-16

## 2018-03-16 MED ORDER — PROCHLORPERAZINE MALEATE 10 MG PO TABS
10.00 | ORAL_TABLET | ORAL | Status: DC
Start: ? — End: 2018-03-16

## 2018-03-16 NOTE — Unmapped (Signed)
Vancomycin Therapeutic Monitoring Pharmacy Note    Jessica Wilson is a 77 y.o. female starting vancomycin. Date of therapy initiation: 03/15/18    Indication: Pneumonia    Prior Dosing Information: None/new initiation     Goals:  Therapeutic Drug Levels  Vancomycin trough goal: 15-20 mg/L    Additional Clinical Monitoring/Outcomes  Renal function, volume status (intake and output)    Results: Not applicable    Wt Readings from Last 1 Encounters:   03/15/18 (!) 104.5 kg (230 lb 6.1 oz)     Creatinine   Date Value Ref Range Status   03/09/2018 1.51 (H) 0.60 - 1.00 mg/dL Final   62/95/2841 3.24 0.60 - 1.00 mg/dL Final   40/06/2724 3.66 (H) 0.60 - 1.00 mg/dL Final        Pharmacokinetic Considerations and Significant Drug Interactions:  ? Adult (estimated initial): Vd = 74.135 L, ke = 0.0343 hr-1  ? Concurrent nephrotoxic meds: not applicable    Assessment/Plan:  Recommended Dose  ? Start 1250mg  q24hr  ? Estimated trough on recommended regimen: 14 mg/L    Follow-up  ? Level due: prior to fourth or fifth dose  ? A pharmacist will continue to monitor and order levels as appropriate    Please page service pharmacist with questions/clarifications.    Maryln Manuel, PharmD

## 2018-03-16 NOTE — Unmapped (Signed)
E1 Malignant Hematology  History and Physical    Assessment/Plan:    Principal Problem:    Pulmonary infiltrates  Active Problems:    MDS (myelodysplastic syndrome), high grade (CMS-HCC)    Acute myeloid leukemia not having achieved remission (CMS-HCC)    Anemia    COPD, mild (CMS-HCC)    Diabetes mellitus, type 2 (CMS-HCC)    Dyslipidemia    Essential (primary) hypertension    Paroxysmal atrial fibrillation (CMS-HCC)      Jessica Wilson is a 77 y.o. female with PMHx as noted below that presents to [facility] with Pulmonary infiltrates.    Bilateral pulmonary infiltrates: Patient was incidentally found to have bilateral BLL infiltrates on abdominal imaging on 6/30, and was asymptomatic at the time. She subsequently developed cough and SOB which has not had substantial improvement with treatment with broad spectrum antibiotics at OSH. She is transferred to [facility] for higher level of care. Her CXR at admission still has remarkable bilateral infiltrates, but reassuringly she remains stable on RA without hypoxia. The differential remains broad including pneumonia, differentiation syndrome, pulmonary edema, and alveolar hemorrhage secondary to DIC. She has persistent symptoms despite treatment with broad spectrum antibiotics, so I suspect there could be multiple processes involved. She does not appear septic at this time. Pro-BNP is elevated, and although I do not think this is predominantly driven by CHF, she does have several cardiac risk factors. She does not appear to be in DIC right now, as she has had chronic thrombocytopenia, does not have active bleeding, and her coags are normal today. Regarding differentiation syndrome, this does occur in 10-19% of patients on Enasidenib, and she has been treated empirically in November of last year for abdominal pain and splenomegaly. This would be a late presentation of DS, which typically occurs within 5 months of initiation, however given her lack of improvement at the OSH she was started on decadron prior to transfer. She does have at least 3 recognized features of DS (although no definitive diagnostic criteria), which would give her 'moderate' disease. Fortunately, she does not have hypoxia or hypotension at this time.  -- I spoke with [place] about getting CT chest imaging transferred to Epic  -- Continue Vanc, Cefepime, and azithromycin for now. She last got Vanc on 7/4, so will redose her now for MRSA coverage since she has not improved. Could consider fungal coverage if she does not improve.  -- F/u lower respiratory culture and respiratory viral panel  -- Add on mycoplasma Ab and Fungitell  -- Blood cultures obtained per PNA protocol, although obtained after antibiotic initiation  -- Dexamethasone 10mg  BID for empiric DS treatment, could increase frequency tomorrow  -- Continue to hold Enasidenib  -- Echo ordered given elevated pro-BNP  -- Consider Pulmonary consult in the AM  -- F/u D-dimer - not currently concerned for PE given no LE swelling, no hypoxia or tachycardia    Relapsed AML, transformed from previous MDS: Follows with Dr. Malen Gauze as an outpatient as well as Dr. Donneta Romberg locally in [place]. Has been on Enasidenib since 06/2017. See detailed history below. She has had recurrence of blasts and transaminitis as of 7/1, concerning for worsening progression of her disease. Considering change in therapy (cytarabine and venetoclax) in the future.   -- Notify Dr. Malen Gauze of admission  -- HOLD Enasidenib for now  -- Improved transaminitis, potentially was medication side effect (doxy vs. Enasidenib), not progression  -- Daily CMP as well as CBC w/ diff  -- DIC  and TLS labs daily  -- Transfuse for Hgb <7 and platelets <10  -- Transfuse cryoprecipitate for fibrinogen <150 if bleeding or consumptive process   -- PPx: Acyclovir 400mg  BID   ??  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   -- Add on phosphorus   -- Will start Allopurinol 300mg  daily  -- 500cc IVF bolus  -- q8h TLS labs  ??  AKI: Cr 1.55 with baseline seemingly ~1.0. Appears to have had several episodes of AKI in the past. Hypovolemia could be contributing given likely infection.   -- IVF bolus now, daily CMP    Chronic Medical Conditions:  Afib/HTN: Will monitor pressures overnight and consider restarting home diltiazem in the AM.  DM: SSI  COPD: Home ellipta, duonebs q6hrs while awake  Depression: Home Bupropion 150mg  BID and sertraline 100mg  daily    Daily Checklist:  Diet: Regular Diet  DVT PPx: SCDs   GI PPx: Not Indicated  Electrolytes: No Repletion Needed  Code Status: DNR and DNI  Dispo: Admit to E1, floor status  ___________________________________________________________________    Chief Complaint  Dyspnea    HPI:  Jessica Wilson is a 77 y.o. female with PMHx as noted below that presents to [facility] with Pulmonary infiltrates.    Jessica Wilson presents as a transfer from [facility] for ongoing management of about 5 days of dyspnea with bilateral pulmonary infiltrates noted on chest imaging. Patient is somewhat of a difficult historian, but states that she initially presented to [place] on 6/29 for a kidney stone flare. Abdominal maging at that time incidentally found lower lung infiltrates, but at that time she was asymptomatic. She was discharged on 7/1 and followed up with her primary Oncologist, Dr. Malen Gauze that same day, and denies fevers, cough, or dyspnea at that time. Her current chemotherapy regimen consists of daily PO Enasidenib which Dr. Malen Gauze held on 7/1 due to elevated LFTs (grade 3), although it was not clear if Enasidenib or Doxycycline (which she had been taking for dental procedure) was to blame. Both medications were held at that time. She re-presented to [facility] on 7/3 with new shortness of breath and cough, and was admitted given her prior imaging. She was reported febrile on admission to 102, but has had a Tmax of 99.2 since admission. She was started on broad spectrum antibiotics for treatment of suspected multifocal pneumonia, but leukemia infiltration was also on the differential. She had not been hypoxic during admission, but today 7/7 she was subjectively more dyspneic, and transfer was requested to [facility]. During interview with her upon arrival to the floor, patient had noted frequent cough with production of brown-tinged sputum. There has not been blood in her sputum. She felt somewhat feverish but has not felt warm today. She endorses continued SOB, which feels like she is not able to take a deep breath; it feels like it just stops. She denies pleuritic CP or CP at rest. She normally ambulates on her own well with no limitation from SOB, but states she does not think she could make it from her hospital bed to the door today. She continues to eat and drink without issue, denies urinary changes, does endorse some constipation, and has had intermittent nausea without vomiting since admission. No headaches or vision changes. Denies lower extremity swelling as well. She did receive one dose of her Enasidenib on 7/6 at [facility].    Upon arrival to the floor at  [facility], she was hemodynamically stable with a HR of 84 and BP of 129/56. She was afebrile and not hypoxic, saturating 96% on RA. Blood work remarkable for WBC of 8.2, platelets of 28 (stably low), Creatinine of 1.55 (stable from 7/1 but  increased since 5/30 with a baseline ?1.0), improved LFTs (now nearly normal from the 500s on 7/1), fibrinogen of 704. CXR has not been formally read, but diffuse, very significant R > L infiltrates. She is admitted to E1 for further management.      Oncology History   ?? Referring/Local Oncologist: Dr. Louretta Shorten, [facility]  ??  Diagnosis: MDS with Excess Blasts-2  ??  Genetics:              Karyotype/FISH: 46XX  ??              Molecular Genetics: not performed  ??  Pertinent Phenotypic data: no aberrant immunophenotype by flow cytometry, however blasts stained by IHC for CD117, MPO.  Only 5% marrow cells stained for CD34.  ??  Disease-specific prognostic estimation: IPSS-R high risk, median OS 1.6 years, with 25% AML risk at 1.4 years  ??  ??  ??   ??   MDS (myelodysplastic syndrome), high grade (CMS-HCC)   ?? 04/17/2016 Initial Diagnosis   ?? ?? MDS (myelodysplastic syndrome), high grade (RAF-HCC)  ??   ??   ?? 04/29/2016 -  Chemotherapy   ?? ?? Azacitidine cycle 1: 75 mg/m2 [place] days 1-7 of 28-day cycles  ??   ??   ?? 05/27/2016 -  Chemotherapy   ?? ?? Azacitidine cycle 2: 75 mg/m2 [place] days 1-7 of 28 day cycles  ??   ??   ?? 06/24/2016 -  Chemotherapy   ?? ?? Azacitidine cycle 3: 75 mg/m2 [place] days 1-7 of 28 day cycle  ??   ??   ?? 07/22/2016 -  Chemotherapy   ?? ?? Azacitidine cycle 4: 75 mg/m2 [place] days 1-7 of 28 day cycle  ??   ??   ?? 08/19/2016 -  Chemotherapy   ?? ?? Azacitidine cycle 5: 75 mg/m2 [place] x 7 days of 28 day cycle  ??   ??   ?? 09/16/2016 -  Chemotherapy   ?? ?? Azacitidine cycle 6: 75 mbg/m2 [place] x 7 days of 28 day cycle  ??   ??   ?? 10/14/2016 -  Chemotherapy   ?? ?? Azacitidine cycle 7: 75 mg/m2 [place] x 7 days of 28 day cycle  ??   ??   ?? 12/09/2016 -  Chemotherapy   ?? ?? Azacitidine cycle 8: 60 mg/m2 [place] x 7 days of 28 day (cycle reduced by 20% due to hematologic toxicity)  ??   ??   ?? 01/13/2017 -  Chemotherapy   ?? ?? Azacitidine cycle 9: 60 mg/m2  x 5 days of 28 day cycle (reduced by 2 days and 20% per dose due to hematologic toxicity)  ??   ??   ?? 05/29/2017 Progression   ?? ?? Given increasing transfusion requirements, repeat bone marrow biopsy done, now with 35% blasts and meets criteria for progression to AML.  ??   ??   ??   Acute myeloid leukemia not having achieved remission (CMS-HCC)   ?? 06/02/2017 Initial Diagnosis   ?? ?? Acute myeloid leukemia not having achieved remission  ??   ??   ?? 06/28/2017 - 07/17/2017 Chemotherapy   ?? ?? enasidenib 100 mg by mouth daily  ??   ??   ??  07/17/2017 Adverse Reaction   ?? ?? Acute abdominal pain, splenomegaly, AKI , elevated transaminases.  Enasidenib held thru 11/13 and treated empirically for differentiation syndrome with dexamethasone. Resumed enasidenib 07/23/17  ??   ??   ?? 07/23/2017 -  Chemotherapy   ?? ?? enasidenib 100 mg PO daily         Allergies:  Macrobid [nitrofurantoin monohyd/m-cryst]    Medications:   Prior to Admission medications    Medication Dose, Route, Frequency   acetaminophen (TYLENOL) 325 MG tablet 650 mg, Oral, Every 6 hours PRN   acyclovir (ZOVIRAX) 400 MG tablet 400 mg, Oral, 2 times a day (standard)   buPROPion (WELLBUTRIN SR) 150 MG 12 hr tablet 2 tabs po daily   chlorhexidine (PERIDEX) 0.12 % solution 15 mL, Mouth, 2 times a day (standard)   clonazePAM (KLONOPIN) 0.5 MG tablet 0.5 mg, Oral, 2 times a day PRN   diltiazem (CARDIZEM CD) 180 MG 24 hr capsule 180 mg, Oral, Daily (standard)   doxycycline (PERIOSTAT) 20 MG tablet 20 mg, Oral, 2 times a day (standard)   enasidenib (IDHIFA) 100 mg tablet 100 mg, Oral, Daily (standard)   lidocaine-prilocaine (EMLA) cream No dose, route, or frequency recorded.   prochlorperazine (COMPAZINE) 10 MG tablet 10 mg, Oral, Every 6 hours PRN   sertraline (ZOLOFT) 100 MG tablet 150 mg, Oral, Daily (standard)   tamsulosin (FLOMAX) 0.4 mg capsule 0.4 mg, Oral, Daily (standard)  Patient not taking: Reported on 03/09/2018   umeclidinium-vilanterol (ANORO ELLIPTA) 62.5-25 mcg/actuation inhaler 1 puff, Inhalation, Daily (standard)       Medical History:  Past Medical History:   Diagnosis Date   ??? Anxiety disorder    ??? Chronic kidney disease    ??? Depression    ??? Diabetes mellitus without complication (CMS-HCC)    ??? Fibrocystic breast    ??? GERD (gastroesophageal reflux disease)    ??? Hyperlipidemia    ??? Hypertension    ??? MDS (myelodysplastic syndrome), high grade (CMS-HCC) 04/17/2016   ??? Nephrolithiasis    ??? Obesity    ??? Sleep apnea        Surgical History:  Past Surgical History:   Procedure Laterality Date   ??? CARDIAC CATHETERIZATION     ??? CHOLECYSTECTOMY  02/24/2015   ??? DIAGNOSTIC LAPAROSCOPY      removal of benign abdominal tumor ??? EXCISION / BIOPSY BREAST / NIPPLE / DUCT Right Age 55   ??? HYSTERECTOMY  age 39       Social History:  Tobacco use:   reports that she quit smoking about 29 years ago. Her smoking use included cigarettes. She quit smokeless tobacco use about 26 years ago.  Alcohol use:   reports that she does not drink alcohol.  Drug use:  reports that she does not use drugs.  Living situation: the patient lives with their family.    Family History:  Family History   Problem Relation Age of Onset   ??? Heart failure Mother    ??? Diabetes Mother    ??? Coronary artery disease Mother    ??? Cirrhosis Father        Review of Systems:  10 systems reviewed and are negative unless otherwise mentioned in HPI      Physical Exam:  Temp:  [37 ??C] 37 ??C  Heart Rate:  [84] 84  Resp:  [20] 20  BP: (129)/(56) 129/56  SpO2:  [96 %] 96 %  Body mass index is 40.81 kg/m??.  GEN: Obese, elderly female sitting up at the side of bed, no acute distress.   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 MMM, no petechiae, L PERRL and EOMI, no cervical LAD noted.  CV: RRR, normal S1/S2, no murmurs appreciated  PULM: Breathing comfortably on [place], distant breath sounds but some R sided rhonchi noted as well as faint expiratory wheeze throughout. Productive cough noted.  ABD: Obese, normal BS, soft, NTND  EXT: No LE edema appreciated  NEURO: CN II-XII grossly intact, No focal deficits, 5/5 strength throughout  MSK: No spinal tenderness    Test Results:  Data Review:  I have reviewed the labs and studies from the last 24 hours.    Imaging: Radiology studies were personally reviewed

## 2018-03-16 NOTE — Unmapped (Signed)
Pt. In with PNA, new relapse AML. Pt. Tx in from [place] at 2000. Pt. SOB with exertion. 4L [place] 02 sat 93%. Pt. Has productive cough, sputum sent. Port was accessed and multiple labs drawn. Watch for [facility], pt. BNP is slightly elevated. Pt. Ambulates to bedside commode without assistance. Pt. Is safe to get to bedside commode but is a watch assist to ambulate further. Pt. Has echo scheduled for am as well as EKG. Pt. Has bruising scattered throughout. DMII, will follow GL r/t steroids and IV atb. Vitals have been stable, denies pain other than when coughing and deep breathing. Will cont. To monitor pt.       Problem: Adult Inpatient Plan of Care  Goal: Plan of Care Review  Outcome: Progressing  Goal: Patient-Specific Goal (Individualization)  Outcome: Progressing  Goal: Absence of Hospital-Acquired Illness or Injury  Outcome: Progressing  Goal: Optimal Comfort and Wellbeing  Outcome: Progressing  Goal: Readiness for Transition of Care  Outcome: Progressing  Goal: Rounds/Family Conference  Outcome: Progressing     Problem: Self-Care Deficit  Goal: Improved Ability to Complete Activities of Daily Living  Outcome: Progressing     Problem: Fall Injury Risk  Goal: Absence of Fall and Fall-Related Injury  Outcome: Progressing     Problem: Perinatal Fall Injury Risk  Goal: Absence of Fall, Infant Drop and Related Injury  Outcome: Progressing     Problem: Diabetes Comorbidity  Goal: Blood Glucose Level Within Desired Range  Outcome: Progressing     Problem: Heart Failure Comorbidity  Goal: Maintenance of Heart Failure Symptom Control  Outcome: Progressing     Problem: Hypertension Comorbidity  Goal: Blood Pressure in Desired Range  Outcome: Progressing

## 2018-03-16 NOTE — Unmapped (Signed)
PHYSICAL THERAPY  Evaluation (03/16/18 1019)     Patient Name:  Jessica Wilson       Medical Record Number: [id number]   Date of Birth: 21-Apr-1941  Sex: Female            Treatment Diagnosis: Impaired mobility s/p Pulmonary infiltrates.    ASSESSMENT    The patient is a 77 y.o. female referred to physical therapy with a PMHx of MDS converted to AML, who presents to [facility] with Pulmonary infiltrates. She presents with below baseline mobility with deficits in functional mobility, activity tolerance, and gait quality. She is limited by fatigue and desaturation with activity, requiring mod A for transferring to EOB, and CGA/min A and RW for safety with OOB mobility. She requires multiple rest breaks d/t desaturation to the 80s on 2LO2, requiring cues for pursed lip breathing. Her current functional status, co-morbidities, and family support make sher complexity moderate. Based on the AM-PAC 5 item raw score of 14/20, the patient is considered to be 48.43% impaired with basic mobility. She will benefit from skilled PT in the acute setting to address her deficits, as well as post-acute PT of 5x/wk (low intensity) to address her remaining deficits; may progress to 3x/wk with improving activity tolerance.      Today's Interventions: AM-PAC: 14/20; bed mobility, transfers, gait, sitting/standing balance/tolerance, pt education on: PT role and POC, fall risks, activity pacing, IS review, benefits of sitting up, up with assist, progressive mobility, pursed lip breathing    Activity Tolerance: Patient limited by fatigue;Patient tolerated treatment well;Other(desaturation with activity)    PLAN  Planned Frequency of Treatment:  1-2x per day for: 3-4x week      Planned Interventions: Balance activities;Diaphragmatic / Pursed-lip breathing;Education - Patient;Education - Family / caregiver;Endurance activities;Functional mobility;Self-care / Home training;Transfer training;Therapeutic activity;Gait training;Home exercise program;Therapeutic exercise    Post-Discharge Physical Therapy Recommendations:  5x weekly;Low intensity        PT DME Recommendations: Defer to post acute(owns RW)     Goals:   Patient and Family Goals: To return to PLOF    Long Term Goal #1: The patient will safely complete all short community mobility with LRAD and mod I in 6 weeks       SHORT GOAL #1: The patient will safely complete all functional transfers with LRAD and mod I              Time Frame : 2 weeks  SHORT GOAL #2: The patietn will safely ambulate 200 feet with LRAD and supervison              Time Frame : 2 weeks                                                          Prognosis:  Good  Barriers to Discharge: Endurance deficits;Gait instability  Positive Indicators: PLOF, family support, motivation    SUBJECTIVE  Patient reports: I've been chained to this bed  Current Functional Status: Patient in bed upon arrival with bed alarm on and sister present; up in chair with chair alarm on and with needs in reach, sister present and RN aware of pt positioning  Services patient receives: OT;PT  Prior functional status: Reports independence with ADLs and mobility at baseline, ambulates short community distances without AD, recently returned  to driving to take herself to MD appointments; no recent falls  Equipment available at home: Shower chair;Rolling Dan Humphreys    Past Medical History:   Diagnosis Date   ??? Anxiety disorder    ??? Chronic kidney disease    ??? Depression    ??? Diabetes mellitus without complication (CMS-HCC)    ??? Fibrocystic breast    ??? GERD (gastroesophageal reflux disease)    ??? Hyperlipidemia    ??? Hypertension    ??? MDS (myelodysplastic syndrome), high grade (CMS-HCC) 04/17/2016   ??? Nephrolithiasis    ??? Obesity    ??? Sleep apnea     Social History     Tobacco Use   ??? Smoking status: Former Smoker     Types: Cigarettes     Last attempt to quit: 09/09/1988     Years since quitting: 29.5   ??? Smokeless tobacco: Former Neurosurgeon     Quit date: 06/05/1991 Substance Use Topics   ??? Alcohol use: No      Past Surgical History:   Procedure Laterality Date   ??? CARDIAC CATHETERIZATION     ??? CHOLECYSTECTOMY  02/24/2015   ??? DIAGNOSTIC LAPAROSCOPY      removal of benign abdominal tumor   ??? EXCISION / BIOPSY BREAST / NIPPLE / DUCT Right Age 30   ??? HYSTERECTOMY  age 3    Family History   Problem Relation Age of Onset   ??? Heart failure Mother    ??? Diabetes Mother    ??? Coronary artery disease Mother    ??? Cirrhosis Father         Allergies: Macrobid [nitrofurantoin monohyd/m-cryst]                Objective Findings              Precautions: Falls              Weight Bearing Status: Non- applicable              Required Braces or Orthoses: Non- applicable    Communication Preference: Verbal  Pain Comments: No c/o pain  Medical Tests / Procedures: Imaging and labs reviewed  Equipment / Environment: Vascular access (PIV, TLC, Port-a-cath, PICC);Supplemental oxygen    At Rest: VSS per Epic, cleared by RN  With Activity: SpO2 80% 2LO2 with activity, cues for pursed lip breathing, able to return to 90-91% on 2LO2, RN aware of desaturation  Orthostatics: Asymptomatic       Living environment: House  Lives With: Spouse  Home Living: One level home;Ramped entrance;Walk-in shower             Cognition: Able to follow commands, pt hard of hearing. Did provide inconsistent answers at time re: PLOF (sister present to correct pt), although may be d/t lack of sleep and pt's HOH     Skin Inspection: Visible skin intact    UE ROM: WFL  UE Strength: WFL  LE ROM: WFL  LE Strength: Grossly 4+/5 bilaterally                       Sensation: Denies numbness/tingling  Balance: Sitting EOB with CGA to SBA, standing at RW with CGA         Bed Mobility: Transferred supine->sit with HOB elevated with mod A with cues for sequencing.  Transfers: Transferred sit <-> stand x4 reps total with RW and CGA, cues for UE placement and reaching back prior to sitting. Stand pivot  transfer bed <-> BSC with CGA and RW. Gait: Ambulated 20 feet, 3 feet with RW and CGA to intermittent min A, cues for activity pacing and RW mgmt/safety with lines/leads, requires min A for safety with backing up d/t slight instability; pt ambulates with slow gait with slight increased trunk sway, overall limited by fatigue and SOB with activity, requiring prolonged seated rest break d/t desaturation with ambulation.   Stairs: N/A      Endurance: Limited by fatigue, desaturation    Eval Duration(PT): 37 Min.    Medical Staff Made Aware: RN Fleet Contras     I attest that I have reviewed the above information.  Signed: Viviana Simpler, PT  Filed 03/16/2018

## 2018-03-16 NOTE — Unmapped (Addendum)
Jessica Wilson is a 77 y.o. 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 Hospital course by problem below:    Pneumonia: Patient was incidentally found to have bilateral BLL infiltrates on abdominal imaging on 6/30, and was asymptomatic at the time. She subsequently developed cough and SOB which has not had substantial improvement with treatment with broad spectrum antibiotics at OSH. She was transferred to [facility] for higher level of care. Her CXR at admission still showed remarkable bilateral infiltrates. She was empirically treated with cefepime, vancomycin, and azithromycin. Lower respiratory culture grew Strep. Treatment was narrowed to cefepime. On 7/11 she was transitioned to levofloxacin and completed antibiotic therapy on 7/13. Interval CXR showed improvement of airways.    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 As of 03/15/2018, she is on enasidenib. Diagnosed from 05/28/17 bone marrow biopsy (30% blasts), FLT3 internal tandem duplication positive, FLT3 tyrosine kinase domain negative. DNMT3A splice site variant and IDH2 missense mutation reported on 06/11/17 myeloid mutation panel. The patient had previously been on azacitadine x 9 cycles (C1D1: 04/29/16; C9D1: 01/13/17) for myelodysplastic syndrome that had been diagnosed from 05/30/16 bone marrow. At admission WBC counts was 8.2, enasidenib was held. WBC count raised to 25 on 7/9 and ensaidenib was resumed the day later. The patient was also given hydroxyurea 500 mg BID and increased to 1000mg  BID due to persistently elevated leukocytosis. Restarted on home enasidenib at time of discharge. Only had 16 days remaining of home supply on 7/15, day of discharge.    AKI: Baseline Cr around 1.0. Had several episodes of AKI in the past. She was diuresed after receiving multiple liters IVF at presentation and had evidence of hypervolemia on exam. Exam improved but had worsening Cr a few days prior to discharge likely in the setting of over diuresis. BNP dropped from >4,000 to 600. Cr was 1.7 at discharge but was making good UOP. She should have a BMP on 7/17 to reassess renal function.

## 2018-03-16 NOTE — Unmapped (Signed)
Care Management  Initial Transition Planning Assessment    Jessica Wilson is a 77 y.o. female with PMHx as noted below that presents to [facility] with Pulmonary infiltrates. Patient was incidentally found to have bilateral BLL infiltrates on abdominal imaging on 6/30, and was asymptomatic at the time. She subsequently developed cough and SOB which has not had substantial improvement with treatment with broad spectrum antibiotics at OSH [facility]). She is transferred to [facility] for higher level of care.               General  Care Manager assessed the patient by : In person interview with patient, In person interview with family, Medical record review, Discussion with Clinical Care team  Orientation Level: Oriented X4  Who provides care at home?: Family member    Contact/Decision Maker        Extended Emergency Contact Information  Primary Emergency Contact: Atha Starks [place]  Home Phone: [phone]  Mobile Phone: [phone]  Relation: Spouse  Secondary Emergency Contact: Smith,Shirley   [place]  Mobile Phone: [phone]  Relation: Sister    Armed forces operational officer Next of Kin / Guardian / POA / Advance Directives       Advance Directive (Medical Treatment)  Does patient have an advance directive covering medical treatment?: Patient does not have advance directive covering medical treatment.  Reason patient does not have an advance directive covering medical treatment:: Patient does not wish to complete one at this time  Information provided on advance directive:: No  Patient requests assistance:: No         Patient Information    Lives with: Spouse/significant other    Type of Residence: Private residence     [address]  [place] [address]   [phone] (home)   [phone] (cell)             Support Systems: Family Members, Spouse    Responsibilities/Dependents at home?: No         Medical Provider(s): Kerman Passey, MD  Reason for Admission: Admitting Diagnosis: PNA/Acute Resp Distress  Past Medical History:   has a past medical history of Anxiety disorder, Chronic kidney disease, Depression, Diabetes mellitus without complication (CMS-HCC), Fibrocystic breast, GERD (gastroesophageal reflux disease), Hyperlipidemia, Hypertension, MDS (myelodysplastic syndrome), high grade (CMS-HCC) (04/17/2016), Nephrolithiasis, Obesity, and Sleep apnea.  Past Surgical History:   has a past surgical history that includes Hysterectomy (age 30); Excision / biopsy breast / nipple / duct (Right, Age 87); Cardiac catheterization; Cholecystectomy (02/24/2015); and Diagnostic laparoscopy.   Previous admit date: 07/19/2017    Primary Insurance- Payor: Advertising copywriter MEDICARE ADV / Plan: UNITED HEALTHCARE MEDICARE ADV / Product Type: *No Product type* /   Secondary Insurance ??? Secondary Insurance  MEDICAID [place]  Prescription Coverage ??? Yes  Preferred Pharmacy - [id number] - [place], [place] - [address] AT [address] OF [address]  [facility] - [place], [place] - [address]    Transportation home: Private vehicle - husband or sister  Level of function prior to admission: Requires Assistance        Home Care services in place prior to admission?: No                  Equipment Currently Used at Home: none       Currently receiving outpatient dialysis?: No       Financial Information  Need for financial assistance?: No       Discharge Needs Assessment    Concerns to be Addressed: discharge planning    Clinical Risk Factors: > 65, Principal Diagnosis: Cancer, Stroke, COPD, Heart Failure, AMI, Pneumonia, Joint Replacment, New Diagnosis, Multiple Diagnoses (Chronic), Functional Limitations    Barriers to taking medications: No    Prior overnight hospital stay or ED visit in last 90 days: No    Readmission Within the Last 30 Days: no previous admission in last 30 days         Anticipated Changes Related to Illness: inability to care for self Equipment Needed After Discharge: (TBD)    Discharge Facility/Level of Care Needs:  N/A    Readmission  Risk of Unplanned Readmission Score: UNPLANNED READMISSION SCORE: 27%  Readmitted Within the Last 30 Days?   Patient at risk for readmission?: Yes    Discharge Plan:  SNF vs Home Infusion with Home Health    Screen findings are: Discharge planning needs identified or anticipated (Comment).(Possible need for SNF versus home infusion with home health)    Expected Discharge Date: 03/19/18    Expected Transfer from Critical Care: (N/A)    Patient and/or family were provided with choice of facilities / services that are available and appropriate to meet post hospital care needs?: No       Initial Assessment complete?: Yes

## 2018-03-16 NOTE — Unmapped (Signed)
E1 Malignant Hematology Daily Progress Note    Interval History:  7/8: VSS. NAEO. Patient reports a productive cough, yellowish-blackish sputum. She denies dysuria, abd pain, N/V/D or headaches.             Assessment/Plan:    Principal Problem:    Pulmonary infiltrates  Active Problems:    MDS (myelodysplastic syndrome), high grade (CMS-HCC)    Acute myeloid leukemia not having achieved remission (CMS-HCC)    Anemia    COPD, mild (CMS-HCC)    Diabetes mellitus, type 2 (CMS-HCC)    Dyslipidemia    Essential (primary) hypertension    Paroxysmal atrial fibrillation (CMS-HCC)    OSA on CPAP      Jessica Wilson is a 77 y.o. female with PMHx as noted below that presents to [facility] with Pulmonary infiltrates.    Bilateral pulmonary infiltrates: Patient was incidentally found to have bilateral BLL infiltrates on abdominal imaging on 6/30, and was asymptomatic at the time. She subsequently developed cough and SOB which has not had substantial improvement with treatment with broad spectrum antibiotics at OSH. She is transferred to [facility] for higher level of care. Her CXR at admission still has remarkable bilateral infiltrates, but reassuringly she remains stable on RA without hypoxia. The differential remains broad including pneumonia, differentiation syndrome, pulmonary edema, and alveolar hemorrhage secondary to DIC. She has persistent symptoms despite treatment with broad spectrum antibiotics, so I suspect there could be multiple processes involved. She does not appear septic at this time. Pro-BNP is elevated, and although I do not think this is predominantly driven by CHF, she does have several cardiac risk factors. She does not appear to be in DIC right now, as she has had chronic thrombocytopenia, does not have active bleeding, and her coags are normal today. Regarding differentiation syndrome, this does occur in 10-19% of patients on Enasidenib, and she has been treated empirically in November of last year for abdominal pain and splenomegaly. This would be a late presentation of DS, which typically occurs within 5 months of initiation, however given her lack of improvement at the OSH she was started on decadron prior to transfer. She does have at least 3 recognized features of DS (although no definitive diagnostic criteria), which would give her 'moderate' disease. Fortunately, she does not have hypoxia or hypotension at this time.  -- I spoke with [place] about getting CT chest imaging transferred to Epic  -- Continue Vanc, Cefepime, and azithromycin for now. She last got Vanc on 7/4, so will redose her now for MRSA coverage since she has not improved. Could consider fungal coverage if she does not improve.  -- F/u lower respiratory culture: grew Strep  -- Continue to hold Enasidenib  -- Consider Pulmonary consult in the AM  -- F/u D-dimer - not currently concerned for PE given no LE swelling, no hypoxia or tachycardia  [ ]  7/8: taper Dex (6/29-) half every 2 days  [ ]  7/8: f/u blood cultures  [ ]  7/8: f//u fungal assay, mycoplasma, RVP   [ ]  7/8 PM: consider weaning off of [place]      Relapsed AML, transformed from previous MDS: Follows with Dr. Malen Gauze as an outpatient as well as Dr. Donneta Romberg locally in [place]. Has been on Enasidenib since 06/2017. See detailed history below. She has had recurrence of blasts and transaminitis as of 7/1, concerning for worsening progression of her disease. Considering change in therapy (cytarabine and venetoclax) in the future.   -- Notify Dr. Malen Gauze  of admission  -- HOLD Enasidenib for now  -- Improved transaminitis, potentially was medication side effect (doxy vs. Enasidenib), not progression  -- Daily CMP as well as CBC w/ diff  -- DIC and TLS labs daily  -- Transfuse for Hgb <7 and platelets <10  -- Transfuse cryoprecipitate for fibrinogen <150 if bleeding or consumptive process   -- PPx: Acyclovir 400mg  BID     ??      Elevated BNP:  1700 07/2017 --> 2800 (03/16/2018)  [ ]  7/8: f/u Echo Concern for Tumor Lysis Syndrome: Uric acid 10.7, LDH 912, K and Ca normal at this time.   -- Add on phosphorus   -- Will start Allopurinol 300mg  daily  -- 500cc IVF bolus  -- q8h TLS labs  ??    AKI: Cr 1.55 with baseline seemingly ~1.0. Appears to have had several episodes of AKI in the past. Hypovolemia could be contributing given likely infection.   -- IVF bolus now, daily CMP, daily Mag      Chronic Medical Conditions:  Afib/HTN: Will monitor pressures overnight and consider restarting home diltiazem in the AM.  DM: SSI  COPD: Home ellipta, duonebs q6hrs while awake  Depression: Home Bupropion 150mg  BID and sertraline 100mg  daily  OSA: On CPAP at home, ordered as inpatient    Daily Checklist:  Diet: Regular Diet  DVT PPx: SCDs   GI PPx: Not Indicated  Electrolytes: No Repletion Needed  Code Status: DNR and DNI  Dispo: Admit to E1, floor status  ___________________________________________________________________           Oncology History   ?? Referring/Local Oncologist: Dr. Louretta Shorten, [facility]  ??  Diagnosis: MDS with Excess Blasts-2  ??  Genetics:              Karyotype/FISH: 46XX  ??              Molecular Genetics: not performed  ??  Pertinent Phenotypic data: no aberrant immunophenotype by flow cytometry, however blasts stained by IHC for CD117, MPO.  Only 5% marrow cells stained for CD34.  ??  Disease-specific prognostic estimation: IPSS-R high risk, median OS 1.6 years, with 25% AML risk at 1.4 years  ??  ??  ??   ??   MDS (myelodysplastic syndrome), high grade (CMS-HCC)   ?? 04/17/2016 Initial Diagnosis   ?? ?? MDS (myelodysplastic syndrome), high grade (RAF-HCC)  ??   ??   ?? 04/29/2016 -  Chemotherapy   ?? ?? Azacitidine cycle 1: 75 mg/m2 [place] days 1-7 of 28-day cycles  ??   ??   ?? 05/27/2016 -  Chemotherapy   ?? ?? Azacitidine cycle 2: 75 mg/m2 [place] days 1-7 of 28 day cycles  ??   ??   ?? 06/24/2016 -  Chemotherapy   ?? ?? Azacitidine cycle 3: 75 mg/m2 [place] days 1-7 of 28 day cycle  ??   ??   ?? 07/22/2016 - Chemotherapy   ?? ?? Azacitidine cycle 4: 75 mg/m2 [place] days 1-7 of 28 day cycle  ??   ??   ?? 08/19/2016 -  Chemotherapy   ?? ?? Azacitidine cycle 5: 75 mg/m2 [place] x 7 days of 28 day cycle  ??   ??   ?? 09/16/2016 -  Chemotherapy   ?? ?? Azacitidine cycle 6: 75 mbg/m2 [place] x 7 days of 28 day cycle  ??   ??   ?? 10/14/2016 -  Chemotherapy   ?? ?? Azacitidine cycle 7:  75 mg/m2 [place] x 7 days of 28 day cycle  ??   ??   ?? 12/09/2016 -  Chemotherapy   ?? ?? Azacitidine cycle 8: 60 mg/m2 [place] x 7 days of 28 day (cycle reduced by 20% due to hematologic toxicity)  ??   ??   ?? 01/13/2017 -  Chemotherapy   ?? ?? Azacitidine cycle 9: 60 mg/m2 [place] x 5 days of 28 day cycle (reduced by 2 days and 20% per dose due to hematologic toxicity)  ??   ??   ?? 05/29/2017 Progression   ?? ?? Given increasing transfusion requirements, repeat bone marrow biopsy done, now with 35% blasts and meets criteria for progression to AML.  ??   ??   ??   Acute myeloid leukemia not having achieved remission (CMS-HCC)   ?? 06/02/2017 Initial Diagnosis   ?? ?? Acute myeloid leukemia not having achieved remission  ??   ??   ?? 06/28/2017 - 07/17/2017 Chemotherapy   ?? ?? enasidenib 100 mg by mouth daily  ??   ??   ?? 07/17/2017 Adverse Reaction   ?? ?? Acute abdominal pain, splenomegaly, AKI , elevated transaminases.  Enasidenib held thru 11/13 and treated empirically for differentiation syndrome with dexamethasone. Resumed enasidenib 07/23/17  ??   ??   ?? 07/23/2017 -  Chemotherapy   ?? ?? enasidenib 100 mg PO daily         Allergies:  Macrobid [nitrofurantoin monohyd/m-cryst]    Medications:   Prior to Admission medications    Medication Dose, Route, Frequency   acetaminophen (TYLENOL) 325 MG tablet 650 mg, Oral, Every 6 hours PRN   acyclovir (ZOVIRAX) 400 MG tablet 400 mg, Oral, 2 times a day (standard)   buPROPion (WELLBUTRIN SR) 150 MG 12 hr tablet 2 tabs po daily   chlorhexidine (PERIDEX) 0.12 % solution 15 mL, Mouth, 2 times a day (standard)   clonazePAM (KLONOPIN) 0.5 MG tablet 0.5 mg, Oral, 2 times a day PRN diltiazem (CARDIZEM CD) 180 MG 24 hr capsule 180 mg, Oral, Daily (standard)   doxycycline (PERIOSTAT) 20 MG tablet 20 mg, Oral, 2 times a day (standard)   enasidenib (IDHIFA) 100 mg tablet 100 mg, Oral, Daily (standard)   lidocaine-prilocaine (EMLA) cream No dose, route, or frequency recorded.   prochlorperazine (COMPAZINE) 10 MG tablet 10 mg, Oral, Every 6 hours PRN   sertraline (ZOLOFT) 100 MG tablet 150 mg, Oral, Daily (standard)   tamsulosin (FLOMAX) 0.4 mg capsule 0.4 mg, Oral, Daily (standard)  Patient not taking: Reported on 03/09/2018   umeclidinium-vilanterol (ANORO ELLIPTA) 62.5-25 mcg/actuation inhaler 1 puff, Inhalation, Daily (standard)         Review of Systems:  10 systems reviewed and are negative unless otherwise mentioned in HPI      Physical Exam:  Temp:  [36.8 ??C-37 ??C] 36.9 ??C  Heart Rate:  [75-87] 87  Resp:  [18-20] 18  BP: (129-143)/(51-68) 130/62  SpO2:  [94 %-97 %] 94 %  Body mass index is 40.81 kg/m??.    GEN: Obese, elderly female sitting up at the side of bed, no acute distress.   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 MMM, no petechiae, L PERRL and EOMI, no cervical LAD noted.  CV: RRR, normal S1/S2, no murmurs appreciated  PULM: Breathing comfortably on [place], distant breath sounds but some R sided rhonchi noted as well as faint expiratory wheeze throughout. Productive cough noted.  ABD: Obese, normal BS, soft, NTND  EXT: No LE  edema appreciated  NEURO: CN II-XII grossly intact, No focal deficits, 5/5 strength throughout  MSK: No spinal tenderness    Test Results:  Data Review:  I have reviewed the labs and studies from the last 24 hours.    Imaging: Radiology studies were personally reviewed

## 2018-03-16 NOTE — Unmapped (Signed)
OCCUPATIONAL THERAPY  Evaluation (03/16/18 1452)    Patient Name:  Jessica Wilson       Medical Record Number: [id number]   Date of Birth: 1940/12/09  Sex: Female          OT Treatment Diagnosis:  Shortness of breath, decreased activity tolerance     Assessment  Pt is a 77 y.o. female with PMHx MDS,Acute myeloid leukemia not having achieved remission, anemia, COPD mild, Diabetes mellitus, type 2, Essential (primary) hypertension, Paroxysmal atrial fibrillation, Dyslipidemia that presents to [facility] with Pulmonary infiltrates. Pt presents to acute care OT with SOB and decreased activity tolerance that is impacting independence with ADLs. Pt will benefit from skilled acute OT to maximize functional independence and safety. Based on the daily activity AM-PAC raw score of 18/24, the pt is considered to be 46.65% impaired with self care. This indicates she would benefit from post acute OT 5x/wk at discharge. After review of contributing co-mobidities and personal factors, clinical presentation and exam findings, patient demonstrates moderate complexity for evaluation and development of plan of care.     Today's Interventions: ADL retraining;Functional mobility;Education - Patient    Activity Tolerance During Today's Session  Other(Shortness of breath )    Plan  Planned Frequency of Treatment:  1-2x per day for: 3-4x week       Planned Interventions:  Adaptive equipment;ADL retraining;Balance activities;Bed mobility;Compensatory tech. training;Conservation;Education - Patient;Education - Family / caregiver;Endurance activities;Functional cognition;Functional mobility;Home exercise program;Modalities;Neuromuscular re-education;Postular / Proximal stability;Positioning;Range of motion;Safety education;Therapeutic exercise;Transfer training;UE Strength / coordination exercise    Post-Discharge Occupational Therapy Recommendations:  OT Post Acute Discharge Recommendations: 5x weekly;Low intensity    ;    OT DME Recommendations: Defer to post acute    GOALS:   Patient and Family Goals: To return home     Long Term Goal #1: Pt will score 24/24 on AM-PAC within two months        Short Term:  Pt will complete BSC transfer with CGA and LRAD    Time Frame : 1 week  Pt will complete standing dynamic reaching task with CGA and LRAD   Time Frame : 1 week  Pt will complete LE bathing with CGA and LRAD    Time Frame : 1 week                  Prognosis:  Good  Positive Indicators:  Strong family support, motivated, cooperative   Barriers to Discharge: Other;Inability to safely perform ADLS(SOB )    Subjective  Current Status Pt in recliner with call bell within reach, chair alarm activated, RN aware   Prior Functional Status Pt was independent with all ADLs and did not use AE for ambulation. Pt states that she drives to get to her appointments and that when she was at home she was not experiencing SOB. She stated that she began to feel SOB after she was in the hospital for kidney stones.     Medical Tests / Procedures: Reviewed   Services patient receives: OT;PT  Patient / Caregiver reports: I did well taking a shower today    Past Medical History:   Diagnosis Date   ??? Anxiety disorder    ??? Chronic kidney disease    ??? Depression    ??? Diabetes mellitus without complication (CMS-HCC)    ??? Fibrocystic breast    ??? GERD (gastroesophageal reflux disease)    ??? Hyperlipidemia    ??? Hypertension    ??? MDS (myelodysplastic syndrome), high  grade (CMS-HCC) 04/17/2016   ??? Nephrolithiasis    ??? Obesity    ??? Sleep apnea     Social History     Tobacco Use   ??? Smoking status: Former Smoker     Types: Cigarettes     Last attempt to quit: 09/09/1988     Years since quitting: 29.5   ??? Smokeless tobacco: Former Neurosurgeon     Quit date: 06/05/1991   Substance Use Topics   ??? Alcohol use: No      Past Surgical History:   Procedure Laterality Date   ??? CARDIAC CATHETERIZATION     ??? CHOLECYSTECTOMY  02/24/2015   ??? DIAGNOSTIC LAPAROSCOPY      removal of benign abdominal tumor   ??? EXCISION / BIOPSY BREAST / NIPPLE / DUCT Right Age 61   ??? HYSTERECTOMY  age 57    Family History   Problem Relation Age of Onset   ??? Heart failure Mother    ??? Diabetes Mother    ??? Coronary artery disease Mother    ??? Cirrhosis Father         Macrobid [nitrofurantoin monohyd/m-cryst]     Objective Findings  Precautions / Restrictions  Falls precautions    Weight Bearing  Non-applicable    Required Braces or Orthoses  Non-applicable    Communication Preference  Verbal    Pain  No c/o pain     Equipment / Environment  Supplemental oxygen;Vascular access (PIV, TLC, Port-a-cath, PICC)(2 L nasal cannula )    Living Situation   Living environment: House   Lives With: Spouse   Home Living: One level home;Ramped entrance;Walk-in shower   Equipment available at home: Information systems manager            Cognition   Orientation Level:  Oriented x 4(Pt was able to identify where she was and identified the date by looking at the board )   Arousal/Alertness:  Appropriate responses to stimuli   Attention Span:  Appears intact   Memory:  Appears intact   Following Commands:  Follows one step commands with repetition   Safety Judgment:  Good awareness of safety precautions   Awareness of Errors:  Good awareness of safety precautions   Problem Solving:  Able to problem solve independently   Comments:      Vision / Perception  Vision: Cataracts(Pt states that she has to get a cataract removed on 8/12)          Hand Function  Hand Dominance: R hand dominant   Good BUE grasp     Skin Inspection  Bruising on BUE     ROM / Strength/Coordination  UE ROM/ Strength/ Coordination: UE ROM and strength WFL   LE ROM/ Strength/ Coordination: LE ROM and strength WFL     Sensation:  Pt denies any sensory deficits     Balance:  CGA for standing balance with RW, sitting balance not assessed     Mobility/Gait/Transfers: Pt received sitting in chair. sit to stand CGA with RW    ADL:  Bathing: MinA (Pt stated that she required assistance for drying her feet when she took a shower at the hospital)  Grooming: Anticipate setup sitting   Dressing: Setup(Pt was able to doff and don L foot using figure four position)  Eating: Setup  Toileting: Per sit to stand, anticipate minA for pivot to [facility] with RW        Vitals/ Orthostatics:  At Rest: SpO2 Seated in recliner after  donning L sock: 88-91%, on 2 L nasal cannula, instructed in pursed-lip breathing   With Activity: SpO2 after standing: 82-90% 2 L nasal cannula, instructed in pursed-lip breathing nurse aware        Interventions Performed During Today's Session: AM-PAC: 18/24, skilled education on OT role/POC, pursed lip breathing, taking rest breaks, sit to stand from recliner with RW, doff/don L sock seated in recliner          Eval Duration (OT): 16 Min.    Medical Staff Made Aware: RN     I attest that I have reviewed the above information.  Signed: Jaclyn Shaggy, OT  Filed 03/16/2018     I was physically present and immediately available to direct and supervise tasks that were related to patient management. The direction and supervision was continuous throughout the time these tasks were performed.     Jaclyn Shaggy, OT

## 2018-03-16 NOTE — Telephone Encounter (Signed)
Per md - pt was transported to [facility]. F/u to be decided on outcome from [facility]

## 2018-03-16 NOTE — Telephone Encounter (Signed)
Peripheral blood smear from 03/12/18 shows AML. She does not have a follow up appointment scheduled at this time

## 2018-03-17 LAB — COMPREHENSIVE METABOLIC PANEL
ALBUMIN: 3.5 g/dL (ref 3.5–5.0)
ALKALINE PHOSPHATASE: 122 U/L (ref 38–126)
ALT (SGPT): 49 U/L — ABNORMAL HIGH (ref 15–48)
ANION GAP: 9 mmol/L (ref 9–15)
AST (SGOT): 37 U/L (ref 14–38)
BILIRUBIN TOTAL: 1.2 mg/dL (ref 0.0–1.2)
BLOOD UREA NITROGEN: 40 mg/dL — ABNORMAL HIGH (ref 7–21)
CALCIUM: 8.9 mg/dL (ref 8.5–10.2)
CHLORIDE: 108 mmol/L — ABNORMAL HIGH (ref 98–107)
CO2: 23 mmol/L (ref 22.0–30.0)
CREATININE: 1.29 mg/dL — ABNORMAL HIGH (ref 0.60–1.00)
EGFR CKD-EPI AA FEMALE: 46 mL/min/{1.73_m2} — ABNORMAL LOW (ref >=60)
EGFR CKD-EPI NON-AA FEMALE: 40 mL/min/{1.73_m2} — ABNORMAL LOW (ref >=60)
GLUCOSE RANDOM: 232 mg/dL — ABNORMAL HIGH (ref 65–179)
POTASSIUM: 4.2 mmol/L (ref 3.5–5.0)
PROTEIN TOTAL: 6.5 g/dL (ref 6.5–8.3)
SODIUM: 140 mmol/L (ref 135–145)

## 2018-03-17 LAB — MANUAL DIFFERENTIAL
BASOPHILS - REL (DIFF): 0 %
BLASTS - REL (DIFF): 15 % (ref <=0)
EOSINOPHILS - REL (DIFF): 0 %
LYMPHOCYTES - ABS (DIFF): 3 10*9/L (ref 1.5–5.0)
LYMPHOCYTES - REL (DIFF): 12 %
MONOCYTES - ABS (DIFF): 3.5 10*9/L — ABNORMAL HIGH (ref 0.2–0.8)
MONOCYTES - REL (DIFF): 14 %
NEUTROPHILS - ABS (DIFF): 14.8 10*9/L — ABNORMAL HIGH (ref 2.0–7.5)
NEUTROPHILS - REL (DIFF): 59 %

## 2018-03-17 LAB — LACTATE DEHYDROGENASE
Lactate dehydrogenase:CCnc:Pt:Ser/Plas:Qn:: 1494 — ABNORMAL HIGH
Lactate dehydrogenase:CCnc:Pt:Ser/Plas:Qn:: 1990 — ABNORMAL HIGH

## 2018-03-17 LAB — MYCOPLASMA PNEUMONIAE ANTIBODY
MYCO PNEUMO IGG: NEGATIVE
MYCO PNEUMO IGM: NEGATIVE

## 2018-03-17 LAB — PHOSPHORUS
Phosphate:MCnc:Pt:Ser/Plas:Qn:: 2 — ABNORMAL LOW
Phosphate:MCnc:Pt:Ser/Plas:Qn:: 2.5 — ABNORMAL LOW

## 2018-03-17 LAB — CBC W/ AUTO DIFF
HEMATOCRIT: 27.4 % — ABNORMAL LOW (ref 36.0–46.0)
HEMOGLOBIN: 8.5 g/dL — ABNORMAL LOW (ref 12.0–16.0)
MEAN CORPUSCULAR HEMOGLOBIN CONC: 31 g/dL (ref 31.0–37.0)
MEAN CORPUSCULAR HEMOGLOBIN: 30.5 pg (ref 26.0–34.0)
MEAN CORPUSCULAR VOLUME: 98.2 fL (ref 80.0–100.0)
MEAN PLATELET VOLUME: 8.8 fL (ref 7.0–10.0)
NUCLEATED RED BLOOD CELLS: 1 /100{WBCs} (ref <=4)
RED BLOOD CELL COUNT: 2.79 10*12/L — ABNORMAL LOW (ref 4.00–5.20)
RED CELL DISTRIBUTION WIDTH: 20.2 % — ABNORMAL HIGH (ref 12.0–15.0)

## 2018-03-17 LAB — D-DIMER QUANTITATIVE (CH,ML,PD,ET): Lab: 6267 — ABNORMAL HIGH

## 2018-03-17 LAB — BASOPHILS - REL (DIFF): Lab: 0

## 2018-03-17 LAB — FIBRINOGEN LEVEL: Lab: 520 — ABNORMAL HIGH

## 2018-03-17 LAB — MAGNESIUM: Magnesium:MCnc:Pt:Ser/Plas:Qn:: 1.9

## 2018-03-17 LAB — URIC ACID
Urate:MCnc:Pt:Ser/Plas:Qn:: 6.9 — ABNORMAL HIGH
Urate:MCnc:Pt:Ser/Plas:Qn:: 8.3 — ABNORMAL HIGH

## 2018-03-17 LAB — MYCO PNEUMO IGM: Mycoplasma pneumoniae Ab.IgM:PrThr:Pt:Ser:Ord:IA: NEGATIVE

## 2018-03-17 LAB — PLATELET COUNT: Lab: 38 — ABNORMAL LOW

## 2018-03-17 LAB — CREATININE: Creatinine:MCnc:Pt:Ser/Plas:Qn:: 1.29 — ABNORMAL HIGH

## 2018-03-17 MED ORDER — DILTIAZEM HCL ER COATED BEADS 180 MG PO CP24
180.00 | ORAL_CAPSULE | ORAL | Status: DC
Start: 2018-03-24 — End: 2018-03-17

## 2018-03-17 MED ORDER — GENERIC EXTERNAL MEDICATION
Status: DC
Start: ? — End: 2018-03-17

## 2018-03-17 MED ORDER — DEXAMETHASONE SODIUM PHOSPHATE 4 MG/ML IJ SOLN
8.00 | INTRAMUSCULAR | Status: DC
Start: 2018-03-17 — End: 2018-03-17

## 2018-03-17 NOTE — Unmapped (Signed)
Pt on 2L Oxygen via nasal canula, SpO2 remained between 88%-94%. Pt to be fit for CPAP tonight. PT evaled pt, bed and chair alarm remained on. Family remained at bedside. Pt with good appetite. ECHO and EKG pending. Pt remained free from falls and injuries.

## 2018-03-17 NOTE — Unmapped (Signed)
E1 Malignant Hematology Daily Progress Note    Interval History:  7/10: VSS. NAEO. She was weaned off 4L to 2L nasal canula. The patient reports a productive cough that is clear. She denies dysuria, abd pain, N/V/D or headaches.       Assessment/Plan:  Principal Problem:    Pulmonary infiltrates  Active Problems:    MDS (myelodysplastic syndrome), high grade (CMS-HCC)    Acute myeloid leukemia not having achieved remission (CMS-HCC)    Anemia    COPD, mild (CMS-HCC)    Diabetes mellitus, type 2 (CMS-HCC)    Dyslipidemia    Essential (primary) hypertension    Paroxysmal atrial fibrillation (CMS-HCC)    OSA on CPAP    AKI (acute kidney injury) (CMS-HCC)      Jessica Wilson is a 77 y.o. female with relapsed AML s/p enasidinib who presented to [facility] with Pulmonary infiltrates concerning for pneumonia.    Bilateral pulmonary infiltrates: Patient was incidentally found to have bilateral BLL infiltrates on abdominal imaging on 6/30, and was asymptomatic at the time. She subsequently developed cough and SOB which has not had substantial improvement with treatment with broad spectrum antibiotics at OSH. She is transferred to [facility] for higher level of care. Her CXR at admission still has remarkable bilateral infiltrates, but reassuringly she remains stable on RA without hypoxia. The differential remains broad including pneumonia, differentiation syndrome, pulmonary edema, and alveolar hemorrhage secondary to DIC. She has persistent symptoms despite treatment with broad spectrum antibiotics, so I suspect there could be multiple processes involved. She does not appear septic at this time. Pro-BNP is elevated, and although I do not think this is predominantly driven by CHF, she does have several cardiac risk factors. She does not appear to be in DIC right now, as she has had chronic thrombocytopenia, does not have active bleeding, and her coags are normal today. Regarding differentiation syndrome, this does occur in 10-19% of patients on Enasidenib, and she has been treated empirically in November of last year for abdominal pain and splenomegaly. This would be a late presentation of DS, which typically occurs within 5 months of initiation, however given her lack of improvement at the OSH she was started on decadron prior to transfer. She does have at least 3 recognized features of DS (although no definitive diagnostic criteria), which would give her 'moderate' disease. Fortunately, she does not have hypoxia or hypotension at this time.  -- I spoke with Damon about getting CT chest imaging transferred to Epic  -- Continue Vanc, Cefepime, and azithromycin for now. She last got Vanc on 7/4, so will redose her now for MRSA coverage since she has not improved. Could consider fungal coverage if she does not improve.  -- F/u lower respiratory culture: grew Strep  -- Continue to hold Enasidenib  -- Consider Pulmonary consult in the AM  -- F/u D-dimer - not currently concerned for PE given no LE swelling, no hypoxia or tachycardia  [ ]  7/8: taper Dex (6/29-) half every 2 days  [ ]  7/8: f/u blood cultures  [ ]  7/8: f//u fungal assay, mycoplasma, RVP   [ ]  7/8 PM: consider weaning off of [place]    Relapsed AML, transformed from previous MDS: Follows with Dr. Malen Gauze as an outpatient as well as Dr. Donneta Romberg locally in [place]. Has been on Enasidenib since 06/2017. See detailed history below. She has had recurrence of blasts and transaminitis as of 7/1, concerning for worsening progression of her disease. Considering change in therapy (  cytarabine and venetoclax) in the future. Increased WBC and blast count on 7/9 concerning for relapse  -- Notify Dr. Malen Gauze of admission  -- Improved transaminitis, potentially was medication side effect (doxy vs. 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   -- Add on phosphorus   -- Will start Allopurinol 300mg  daily  -- 500cc IVF bolus  -- q8h TLS labs  ??  AKI: Cr 1.55 with baseline seemingly ~1.0. Appears to have had several episodes of AKI in the past. Hypovolemia could be contributing given likely infection.   -- IVF bolus now, daily CMP, daily Mag    Chronic Medical Conditions:  Afib/HTN: Will monitor pressures overnight and consider restarting home diltiazem in the AM.  DM: SSI  COPD: Home ellipta, duonebs q6hrs while awake  Depression: Home Bupropion 150mg  BID and sertraline 100mg  daily  OSA: On CPAP at home, ordered as inpatient    Daily Checklist:  Diet: Regular Diet  DVT PPx: SCDs   GI PPx: Not Indicated  Electrolytes: No Repletion Needed  Code Status: DNR and DNI  Dispo: Admit to E1, floor status  ___________________________________________________________________         Oncology History   ?? Referring/Local Oncologist: Dr. Louretta Shorten, [facility]  ??  Diagnosis: MDS with Excess Blasts-2  ??  Genetics:              Karyotype/FISH: 46XX  ??              Molecular Genetics: not performed  ??  Pertinent Phenotypic data: no aberrant immunophenotype by flow cytometry, however blasts stained by IHC for CD117, MPO.  Only 5% marrow cells stained for CD34.  ??  Disease-specific prognostic estimation: IPSS-R high risk, median OS 1.6 years, with 25% AML risk at 1.4 years  ??  ??  ??   ??   MDS (myelodysplastic syndrome), high grade (CMS-HCC)   ?? 04/17/2016 Initial Diagnosis   ?? ?? MDS (myelodysplastic syndrome), high grade (RAF-HCC)  ??   ??   ?? 04/29/2016 -  Chemotherapy   ?? ?? Azacitidine cycle 1: 75 mg/m2 [place] days 1-7 of 28-day cycles  ??   ??   ?? 05/27/2016 - Chemotherapy   ?? ?? Azacitidine cycle 2: 75 mg/m2 [place] days 1-7 of 28 day cycles  ??   ??   ?? 06/24/2016 -  Chemotherapy   ?? ?? Azacitidine cycle 3: 75 mg/m2 [place] days 1-7 of 28 day cycle  ??   ??   ?? 07/22/2016 -  Chemotherapy   ?? ?? Azacitidine cycle 4: 75 mg/m2 [place] days 1-7 of 28 day cycle  ??   ??   ?? 08/19/2016 -  Chemotherapy   ?? ?? Azacitidine cycle 5: 75 mg/m2 [place] x 7 days of 28 day cycle  ??   ??   ?? 09/16/2016 -  Chemotherapy   ?? ?? Azacitidine cycle 6: 75 mbg/m2  [place] x 7 days of 28 day cycle  ??   ??   ?? 10/14/2016 -  Chemotherapy   ?? ?? Azacitidine cycle 7: 75 mg/m2 [place] x 7 days of 28 day cycle  ??   ??   ?? 12/09/2016 -  Chemotherapy   ?? ?? Azacitidine cycle 8: 60 mg/m2 [place] x 7 days of 28 day (cycle reduced by 20% due to hematologic toxicity)  ??   ??   ?? 01/13/2017 -  Chemotherapy   ?? ?? Azacitidine cycle 9: 60 mg/m2  x 5 days of 28 day cycle (reduced by 2 days and 20% per dose due to hematologic toxicity)  ??   ??   ?? 05/29/2017 Progression   ?? ?? Given increasing transfusion requirements, repeat bone marrow biopsy done, now with 35% blasts and meets criteria for progression to AML.  ??   ??   ??   Acute myeloid leukemia not having achieved remission (CMS-HCC)   ?? 06/02/2017 Initial Diagnosis   ?? ?? Acute myeloid leukemia not having achieved remission  ??   ??   ?? 06/28/2017 - 07/17/2017 Chemotherapy   ?? ?? enasidenib 100 mg by mouth daily  ??   ??   ?? 07/17/2017 Adverse Reaction   ?? ?? Acute abdominal pain, splenomegaly, AKI , elevated transaminases.  Enasidenib held thru 11/13 and treated empirically for differentiation syndrome with dexamethasone. Resumed enasidenib 07/23/17  ??   ??   ?? 07/23/2017 -  Chemotherapy   ?? ?? enasidenib 100 mg PO daily       Allergies:  Macrobid [nitrofurantoin monohyd/m-cryst]    Medications:   Prior to Admission medications    Medication Dose, Route, Frequency   acetaminophen (TYLENOL) 325 MG tablet 650 mg, Oral, Every 6 hours PRN   acyclovir (ZOVIRAX) 400 MG tablet 400 mg, Oral, 2 times a day (standard)   buPROPion (WELLBUTRIN SR) 150 MG 12 hr tablet 2 tabs po daily   chlorhexidine (PERIDEX) 0.12 % solution 15 mL, Mouth, 2 times a day (standard)   clonazePAM (KLONOPIN) 0.5 MG tablet 0.5 mg, Oral, 2 times a day PRN   diltiazem (CARDIZEM CD) 180 MG 24 hr capsule 180 mg, Oral, Daily (standard)   doxycycline (PERIOSTAT) 20 MG tablet 20 mg, Oral, 2 times a day (standard)   enasidenib (IDHIFA) 100 mg tablet 100 mg, Oral, Daily (standard)   lidocaine-prilocaine (EMLA) cream No dose, route, or frequency recorded.   prochlorperazine (COMPAZINE) 10 MG tablet 10 mg, Oral, Every 6 hours PRN   sertraline (ZOLOFT) 100 MG tablet 150 mg, Oral, Daily (standard)   tamsulosin (FLOMAX) 0.4 mg capsule 0.4 mg, Oral, Daily (standard)  Patient not taking: Reported on 03/09/2018   umeclidinium-vilanterol (ANORO ELLIPTA) 62.5-25 mcg/actuation inhaler 1 puff, Inhalation, Daily (standard)     Review of Systems:  10 systems reviewed and are negative unless otherwise mentioned in HPI    Physical Exam:  Temp:  [36.8 ??C-37.1 ??C] 37.1 ??C  Heart Rate:  [75-101] 94  Resp:  [18-23] 18  BP: (120-180)/(51-73) 150/63  SpO2:  [93 %-96 %] 94 %  Body mass index is 40.78 kg/m??.    GEN: Obese, elderly female sitting up at the side of bed, no acute distress.   HEENT: PERRL and EOMI,  CV: RRR, normal S1/S2, no murmurs appreciated  PULM: Breathing comfortably on 2L [place], expiratory wheezing throughout  ABD: Obese, normal BS, soft, NTND  EXT: No edema, warm and well perfused    Test Results:  Data Review:  I have reviewed the labs and studies from the last 24 hours.    Imaging: Radiology studies were personally reviewed

## 2018-03-17 NOTE — Unmapped (Addendum)
Pt. In for PNA. Still needs echo, and EKG. Pt. Recently had AML relapse.   PAC is positional pt. Needs to be sitting up to draw blood. Pt. Has had some high Bps throughout night. MD was notified. Seems that Cardizem home med was not continued and is now scheduled for am. MD is aware that Sbp is in the 180s. Pt. Has a lot of rhonchi, reinforced that she needs to be using IS and coughing and deep breathing. 02 sats are 89-92% on 2L [place]. Pt. Does not wear 02 at home. Pt. Also has refused CPAP, husband will bring CPAP from home. Does have hx of COPD. Watch for [facility]. Pt. BNP was elevated. UOP is adequate however lots of IV ATB have been given. Pt. Does not sound congested at this time. Will continue to monitor.     Problem: Adult Inpatient Plan of Care  Goal: Plan of Care Review  Outcome: Progressing  Goal: Patient-Specific Goal (Individualization)  Outcome: Progressing  Goal: Absence of Hospital-Acquired Illness or Injury  Outcome: Progressing  Goal: Optimal Comfort and Wellbeing  Outcome: Progressing  Goal: Readiness for Transition of Care  Outcome: Progressing  Goal: Rounds/Family Conference  Outcome: Progressing     Problem: Self-Care Deficit  Goal: Improved Ability to Complete Activities of Daily Living  Outcome: Progressing     Problem: Fall Injury Risk  Goal: Absence of Fall and Fall-Related Injury  Outcome: Progressing     Problem: Perinatal Fall Injury Risk  Goal: Absence of Fall, Infant Drop and Related Injury  Outcome: Progressing     Problem: Diabetes Comorbidity  Goal: Blood Glucose Level Within Desired Range  Outcome: Progressing     Problem: Heart Failure Comorbidity  Goal: Maintenance of Heart Failure Symptom Control  Outcome: Progressing     Problem: Hypertension Comorbidity  Goal: Blood Pressure in Desired Range  Outcome: Progressing

## 2018-03-17 NOTE — Unmapped (Signed)
Pt stable today. Continues on 2L [place] and uses scheduled nebs. Hearing wheezes and rhonchi on auscultation. Pt states she does feel short of breath at times but SpO2 stable. Sister at the bedside. IV antibiotics given. Hydrea started.

## 2018-03-17 NOTE — Telephone Encounter (Signed)
Urine Culture is negative. Tried calling patient. No answer.

## 2018-03-18 LAB — CBC W/ AUTO DIFF
HEMATOCRIT: 26.3 % — ABNORMAL LOW (ref 36.0–46.0)
HEMOGLOBIN: 8.1 g/dL — ABNORMAL LOW (ref 12.0–16.0)
MEAN CORPUSCULAR HEMOGLOBIN CONC: 31 g/dL (ref 31.0–37.0)
MEAN CORPUSCULAR HEMOGLOBIN: 30.3 pg (ref 26.0–34.0)
MEAN CORPUSCULAR VOLUME: 97.8 fL (ref 80.0–100.0)
MEAN PLATELET VOLUME: 8.3 fL (ref 7.0–10.0)
NUCLEATED RED BLOOD CELLS: 1 /100{WBCs} (ref <=4)
RED BLOOD CELL COUNT: 2.69 10*12/L — ABNORMAL LOW (ref 4.00–5.20)
RED CELL DISTRIBUTION WIDTH: 20.4 % — ABNORMAL HIGH (ref 12.0–15.0)
WBC ADJUSTED: 24.8 10*9/L — ABNORMAL HIGH (ref 4.5–11.0)

## 2018-03-18 LAB — MANUAL DIFFERENTIAL
BASOPHILS - ABS (DIFF): 0 10*9/L (ref 0.0–0.1)
BASOPHILS - REL (DIFF): 0 %
EOSINOPHILS - ABS (DIFF): 0 10*9/L (ref 0.0–0.4)
EOSINOPHILS - REL (DIFF): 0 %
LYMPHOCYTES - ABS (DIFF): 2.5 10*9/L (ref 1.5–5.0)
LYMPHOCYTES - REL (DIFF): 10 %
MONOCYTES - REL (DIFF): 13 %
NEUTROPHILS - ABS (DIFF): 13.1 10*9/L — ABNORMAL HIGH (ref 2.0–7.5)
NEUTROPHILS - REL (DIFF): 53 %

## 2018-03-18 LAB — ALKALINE PHOSPHATASE: Alkaline phosphatase:CCnc:Pt:Ser/Plas:Qn:: 100

## 2018-03-18 LAB — COMPREHENSIVE METABOLIC PANEL
ALBUMIN: 3.3 g/dL — ABNORMAL LOW (ref 3.5–5.0)
ALKALINE PHOSPHATASE: 100 U/L (ref 38–126)
ALT (SGPT): 40 U/L (ref 15–48)
ANION GAP: 8 mmol/L — ABNORMAL LOW (ref 9–15)
AST (SGOT): 26 U/L (ref 14–38)
BILIRUBIN TOTAL: 1 mg/dL (ref 0.0–1.2)
BLOOD UREA NITROGEN: 36 mg/dL — ABNORMAL HIGH (ref 7–21)
BUN / CREAT RATIO: 31
CALCIUM: 8.6 mg/dL (ref 8.5–10.2)
CHLORIDE: 107 mmol/L (ref 98–107)
CO2: 25 mmol/L (ref 22.0–30.0)
CREATININE: 1.15 mg/dL — ABNORMAL HIGH (ref 0.60–1.00)
EGFR CKD-EPI NON-AA FEMALE: 46 mL/min/{1.73_m2} — ABNORMAL LOW (ref >=60)
GLUCOSE RANDOM: 225 mg/dL — ABNORMAL HIGH (ref 65–179)
POTASSIUM: 4.3 mmol/L (ref 3.5–5.0)
PROTEIN TOTAL: 6 g/dL — ABNORMAL LOW (ref 6.5–8.3)
SODIUM: 140 mmol/L (ref 135–145)

## 2018-03-18 LAB — URIC ACID
Urate:MCnc:Pt:Ser/Plas:Qn:: 4.9
Urate:MCnc:Pt:Ser/Plas:Qn:: 5.9
Urate:MCnc:Pt:Ser/Plas:Qn:: 6

## 2018-03-18 LAB — BLASTS - REL (DIFF): Lab: 24

## 2018-03-18 LAB — LACTATE DEHYDROGENASE
LACTATE DEHYDROGENASE: 2553 U/L — ABNORMAL HIGH (ref 338–610)
Lactate dehydrogenase:CCnc:Pt:Ser/Plas:Qn:: 1565 — ABNORMAL HIGH
Lactate dehydrogenase:CCnc:Pt:Ser/Plas:Qn:: 1728 — ABNORMAL HIGH
Lactate dehydrogenase:CCnc:Pt:Ser/Plas:Qn:: 2553 — ABNORMAL HIGH

## 2018-03-18 LAB — PHOSPHORUS
Phosphate:MCnc:Pt:Ser/Plas:Qn:: 1.8 — ABNORMAL LOW
Phosphate:MCnc:Pt:Ser/Plas:Qn:: 1.9 — ABNORMAL LOW
Phosphate:MCnc:Pt:Ser/Plas:Qn:: 2.2 — ABNORMAL LOW

## 2018-03-18 LAB — FIBRINOGEN LEVEL: Lab: 403 — ABNORMAL HIGH

## 2018-03-18 LAB — MACROCYTES

## 2018-03-18 LAB — MAGNESIUM: Magnesium:MCnc:Pt:Ser/Plas:Qn:: 1.9

## 2018-03-18 LAB — D-DIMER QUANTITATIVE (CH,ML,PD,ET): Lab: 6325 — ABNORMAL HIGH

## 2018-03-18 NOTE — Unmapped (Signed)
Pt alert, no change LOC, improving today, persistent SOB with any exertion, able to recover, O2@2Ln /c. OOB chair , ambulates with standby assist to bathroom, BM small x2, prn miralax given. Tolerates IV antibiotics ok, lesions, dryness persist to lips, fading ecchymosis to bilateral hands, arms and BLE. Incentive spirometer demonstrated.

## 2018-03-18 NOTE — Unmapped (Signed)
E1 Malignant Hematology Daily Progress Note    Interval History:  Discontinued vanco. The patient feels her respiratory status has improved. She denies dysuria, abd pain, N/V/D or headaches.       Assessment/Plan:  Principal Problem:    Pulmonary infiltrates  Active Problems:    MDS (myelodysplastic syndrome), high grade (CMS-HCC)    Acute myeloid leukemia not having achieved remission (CMS-HCC)    Anemia    COPD, mild (CMS-HCC)    Diabetes mellitus, type 2 (CMS-HCC)    Dyslipidemia    Essential (primary) hypertension    Paroxysmal atrial fibrillation (CMS-HCC)    OSA on CPAP    AKI (acute kidney injury) (CMS-HCC)    Jessica Wilson is a 77 y.o. female with relapsed AML s/p enasidinib who presented to [facility] with Pulmonary infiltrates concerning for pneumonia vs less likely differentiation syndrome.    Bilateral pulmonary infiltrates: Patient was incidentally found to have bilateral BLL infiltrates on abdominal imaging on 6/30, and was asymptomatic at the time. She subsequently developed cough and SOB which has not had substantial improvement with treatment with broad spectrum antibiotics at OSH. She is transferred to [facility] for higher level of care. Her CXR at admission still has remarkable bilateral infiltrates, but reassuringly she remains stable on RA without hypoxia. The differential remains broad including pneumonia, differentiation syndrome, pulmonary edema, and alveolar hemorrhage secondary to DIC. She has persistent symptoms despite treatment with broad spectrum antibiotics, so I suspect there could be multiple processes involved. She does not appear septic at this time. Pro-BNP is elevated, and although I do not think this is predominantly driven by CHF, she does have several cardiac risk factors. She does not appear to be in DIC right now, as she has had chronic thrombocytopenia, does not have active bleeding, and her coags are normal today. Regarding differentiation syndrome, this does occur in 10-19% of patients on Enasidenib, and she has been treated empirically in November of last year for abdominal pain and splenomegaly. This would be a late presentation of DS, which typically occurs within 5 months of initiation, however given her lack of improvement at the OSH she was started on decadron prior to transfer. She does have at least 3 recognized features of DS (although no definitive diagnostic criteria), which would give her 'moderate' disease. Fortunately, she does not have hypoxia or hypotension at this time.  -- I spoke with [place] about getting CT chest imaging transferred to Epic  -- F/u lower respiratory culture: grew Strep  -- Continue Cefepime  -- Continue to hold Enasidenib  -- Consider Pulmonary consult in the AM  -- F/u D-dimer - not currently concerned for PE given no LE swelling, no hypoxia or tachycardia  [ ]  7/8: taper Dex (6/29-) half every 2 days  [ ]  7/8: f/u blood cultures  [ ]  7/8: f//u fungal assay, mycoplasma, RVP   [ ]  7/8 PM: consider weaning off of [place]    Relapsed AML, transformed from previous MDS: Follows with Dr. Malen Gauze as an outpatient as well as Dr. Donneta Romberg locally in [place]. Has been on Enasidenib since 06/2017. See detailed history below. She has had recurrence of blasts and transaminitis as of 7/1, concerning for worsening progression of her disease. Considering change in therapy (cytarabine and venetoclax) in the future. Increased WBC and blast count on 7/9 concerning for relapse  -- Improved transaminitis, potentially was medication side effect (doxy vs. 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   -- Add on phosphorus   -- Will start Allopurinol 300mg  daily  -- 500cc IVF bolus  -- q8h TLS labs  ??  AKI: Cr 1.55 with baseline seemingly ~1.0. Appears to have had several episodes of AKI in the past. Hypovolemia could be contributing given likely infection.   -- IVF bolus now, daily CMP, daily Mag    Chronic Medical Conditions:  Afib/HTN: Will monitor pressures overnight and consider restarting home diltiazem in the AM.  DM: SSI  COPD: Home ellipta, duonebs q6hrs while awake  Depression: Home Bupropion 150mg  BID and sertraline 100mg  daily  OSA: On CPAP at home, ordered as inpatient    Daily Checklist:  Diet: Regular Diet  DVT PPx: SCDs   GI PPx: Not Indicated  Electrolytes: No Repletion Needed  Code Status: DNR and DNI  Dispo: Admit to E1, floor status  ___________________________________________________________________         Oncology History   ?? Referring/Local Oncologist: Dr. Louretta Shorten, [facility]  ??  Diagnosis: MDS with Excess Blasts-2  ??  Genetics:              Karyotype/FISH: 46XX  ??              Molecular Genetics: not performed  ??  Pertinent Phenotypic data: no aberrant immunophenotype by flow cytometry, however blasts stained by IHC for CD117, MPO.  Only 5% marrow cells stained for CD34.  ??  Disease-specific prognostic estimation: IPSS-R high risk, median OS 1.6 years, with 25% AML risk at 1.4 years  ??  ??  ??   ??   MDS (myelodysplastic syndrome), high grade (CMS-HCC)   ?? 04/17/2016 Initial Diagnosis   ?? ?? MDS (myelodysplastic syndrome), high grade (RAF-HCC)  ??   ??   ?? 04/29/2016 -  Chemotherapy   ?? ?? Azacitidine cycle 1: 75 mg/m2 [place] days 1-7 of 28-day cycles  ??   ??   ?? 05/27/2016 -  Chemotherapy   ?? ?? Azacitidine cycle 2: 75 mg/m2 [place] days 1-7 of 28 day cycles  ??   ??   ?? 06/24/2016 -  Chemotherapy   ?? ?? Azacitidine cycle 3: 75 mg/m2 [place] days 1-7 of 28 day cycle  ??   ??   ?? 07/22/2016 - Chemotherapy   ?? ?? Azacitidine cycle 4: 75 mg/m2 [place] days 1-7 of 28 day cycle  ??   ??   ?? 08/19/2016 -  Chemotherapy   ?? ?? Azacitidine cycle 5: 75 mg/m2 [place] x 7 days of 28 day cycle  ??   ??   ?? 09/16/2016 -  Chemotherapy   ?? ?? Azacitidine cycle 6: 75 mbg/m2 [place] x 7 days of 28 day cycle  ??   ??   ?? 10/14/2016 -  Chemotherapy   ?? ?? Azacitidine cycle 7: 75 mg/m2 [place] x 7 days of 28 day cycle  ??   ??   ?? 12/09/2016 -  Chemotherapy   ?? ??  Azacitidine cycle 8: 60 mg/m2 [place] x 7 days of 28 day (cycle reduced by 20% due to hematologic toxicity)  ??   ??   ?? 01/13/2017 -  Chemotherapy   ?? ?? Azacitidine cycle 9: 60 mg/m2 [place] x 5 days of 28 day cycle (reduced by 2 days and 20% per dose due to hematologic toxicity)  ??   ??   ?? 05/29/2017 Progression   ?? ?? Given increasing transfusion requirements, repeat bone marrow biopsy done, now with 35% blasts and meets criteria for progression to AML.  ??   ??   ??   Acute myeloid leukemia not having achieved remission (CMS-HCC)   ?? 06/02/2017 Initial Diagnosis   ?? ?? Acute myeloid leukemia not having achieved remission  ??   ??   ?? 06/28/2017 - 07/17/2017 Chemotherapy   ?? ?? enasidenib 100 mg by mouth daily  ??   ??   ?? 07/17/2017 Adverse Reaction   ?? ?? Acute abdominal pain, splenomegaly, AKI , elevated transaminases.  Enasidenib held thru 11/13 and treated empirically for differentiation syndrome with dexamethasone. Resumed enasidenib 07/23/17  ??   ??   ?? 07/23/2017 -  Chemotherapy   ?? ?? enasidenib 100 mg PO daily     Allergies:  Macrobid [nitrofurantoin monohyd/m-cryst]    Medications:   Prior to Admission medications    Medication Dose, Route, Frequency   acetaminophen (TYLENOL) 325 MG tablet 650 mg, Oral, Every 6 hours PRN   acyclovir (ZOVIRAX) 400 MG tablet 400 mg, Oral, 2 times a day (standard)   buPROPion (WELLBUTRIN SR) 150 MG 12 hr tablet 2 tabs po daily   chlorhexidine (PERIDEX) 0.12 % solution 15 mL, Mouth, 2 times a day (standard)   clonazePAM (KLONOPIN) 0.5 MG tablet 0.5 mg, Oral, 2 times a day PRN   diltiazem (CARDIZEM CD) 180 MG 24 hr capsule 180 mg, Oral, Daily (standard)   doxycycline (PERIOSTAT) 20 MG tablet 20 mg, Oral, 2 times a day (standard)   enasidenib (IDHIFA) 100 mg tablet 100 mg, Oral, Daily (standard)   lidocaine-prilocaine (EMLA) cream No dose, route, or frequency recorded.   prochlorperazine (COMPAZINE) 10 MG tablet 10 mg, Oral, Every 6 hours PRN   sertraline (ZOLOFT) 100 MG tablet 150 mg, Oral, Daily (standard)   tamsulosin (FLOMAX) 0.4 mg capsule 0.4 mg, Oral, Daily (standard)  Patient not taking: Reported on 03/09/2018   umeclidinium-vilanterol (ANORO ELLIPTA) 62.5-25 mcg/actuation inhaler 1 puff, Inhalation, Daily (standard)     Review of Systems:  10 systems reviewed and are negative unless otherwise mentioned in HPI    Physical Exam:  Temp:  [36.9 ??C-37.4 ??C] 36.9 ??C  Heart Rate:  [75-100] 94  Resp:  [20-22] 22  BP: (117-198)/(58-77) 117/67  SpO2:  [91 %-95 %] 91 %  Body mass index is 40.64 kg/m??.    GEN: Obese, elderly female sitting up at the side of bed, no acute distress.   HEENT: PERRL and EOMI,  CV: RRR, normal S1/S2, no murmurs appreciated  PULM: Breathing comfortably on 2L [place], decreased expiratory wheezing compared to prior  ABD: Obese, normal BS, soft, NTND  EXT: No edema, warm and well perfused    Test Results:  Data Review:  I have reviewed the labs and studies from the last 24 hours.    Imaging: Radiology studies were personally reviewed

## 2018-03-18 NOTE — Unmapped (Signed)
Pt. In PNA and relapse AML. Alert and oriented, sometimes doesn't track conversation very well, HOH may contribute. Pt. Needs stand by assist. Gets SOB with exertion and desaturates very easily. Pt. Family brought home CPAP. Set up via tech and respiratory. 2L [place] bled into. Pt. Maintaining sats and seems to be sleeping better on CPAP. Pt. Seems to have harder time breathing at night, OSA is severe. Monitoring fluid volume status carefully with IV ATB. Pt. Echo has not been completed yet and BNP was elevated when pulled. Hydrouria was started last night. Counts appear to be improving this am. SOB is elevated slightly by sitting in chair. Port was re accessed last night r/t line clotting off. Tolerated well. Very positional, pt. Needs to be upright for port to draw blood. GL has been high, dex is being tapered, pt. Also receiving nebs. Contributing to high Bps, monitoring overnight. Seems to normalize by morning, MD is aware. Will continue to monitor.       Problem: Adult Inpatient Plan of Care  Goal: Plan of Care Review  Outcome: Progressing  Goal: Patient-Specific Goal (Individualization)  Outcome: Progressing  Goal: Absence of Hospital-Acquired Illness or Injury  Outcome: Progressing  Goal: Optimal Comfort and Wellbeing  Outcome: Progressing  Goal: Readiness for Transition of Care  Outcome: Progressing  Goal: Rounds/Family Conference  Outcome: Progressing     Problem: Self-Care Deficit  Goal: Improved Ability to Complete Activities of Daily Living  Outcome: Progressing     Problem: Fall Injury Risk  Goal: Absence of Fall and Fall-Related Injury  Outcome: Progressing     Problem: Perinatal Fall Injury Risk  Goal: Absence of Fall, Infant Drop and Related Injury  Outcome: Progressing     Problem: Diabetes Comorbidity  Goal: Blood Glucose Level Within Desired Range  Outcome: Progressing     Problem: Heart Failure Comorbidity  Goal: Maintenance of Heart Failure Symptom Control  Outcome: Progressing     Problem: Hypertension Comorbidity  Goal: Blood Pressure in Desired Range  Outcome: Progressing

## 2018-03-19 ENCOUNTER — Telehealth: Payer: Self-pay | Admitting: Obstetrics & Gynecology

## 2018-03-19 DIAGNOSIS — R918 Other nonspecific abnormal finding of lung field: Principal | ICD-10-CM

## 2018-03-19 LAB — MANUAL DIFFERENTIAL
BASOPHILS - ABS (DIFF): 0 10*9/L (ref 0.0–0.1)
BLASTS - REL (DIFF): 19 % (ref <=0)
EOSINOPHILS - ABS (DIFF): 0.3 10*9/L (ref 0.0–0.4)
EOSINOPHILS - REL (DIFF): 1 %
LYMPHOCYTES - ABS (DIFF): 0.8 10*9/L — ABNORMAL LOW (ref 1.5–5.0)
LYMPHOCYTES - REL (DIFF): 3 %
MONOCYTES - ABS (DIFF): 4.8 10*9/L — ABNORMAL HIGH (ref 0.2–0.8)
MONOCYTES - REL (DIFF): 18 %
NEUTROPHILS - REL (DIFF): 59 %

## 2018-03-19 LAB — COMPREHENSIVE METABOLIC PANEL
ALBUMIN: 3.7 g/dL (ref 3.5–5.0)
ALKALINE PHOSPHATASE: 104 U/L (ref 38–126)
ALT (SGPT): 45 U/L (ref 15–48)
ANION GAP: 7 mmol/L — ABNORMAL LOW (ref 9–15)
AST (SGOT): 39 U/L — ABNORMAL HIGH (ref 14–38)
BILIRUBIN TOTAL: 1.4 mg/dL — ABNORMAL HIGH (ref 0.0–1.2)
BLOOD UREA NITROGEN: 36 mg/dL — ABNORMAL HIGH (ref 7–21)
BUN / CREAT RATIO: 34
CALCIUM: 8.8 mg/dL (ref 8.5–10.2)
CHLORIDE: 108 mmol/L — ABNORMAL HIGH (ref 98–107)
CO2: 27 mmol/L (ref 22.0–30.0)
CREATININE: 1.07 mg/dL — ABNORMAL HIGH (ref 0.60–1.00)
EGFR CKD-EPI AA FEMALE: 58 mL/min/{1.73_m2} — ABNORMAL LOW (ref >=60)
EGFR CKD-EPI NON-AA FEMALE: 51 mL/min/{1.73_m2} — ABNORMAL LOW (ref >=60)
GLUCOSE RANDOM: 244 mg/dL — ABNORMAL HIGH (ref 65–179)
POTASSIUM: 3.8 mmol/L (ref 3.5–5.0)
PROTEIN TOTAL: 6.8 g/dL (ref 6.5–8.3)

## 2018-03-19 LAB — CBC W/ AUTO DIFF
HEMOGLOBIN: 9.1 g/dL — ABNORMAL LOW (ref 12.0–16.0)
MEAN CORPUSCULAR HEMOGLOBIN CONC: 31.2 g/dL (ref 31.0–37.0)
MEAN CORPUSCULAR HEMOGLOBIN: 30.4 pg (ref 26.0–34.0)
MEAN CORPUSCULAR VOLUME: 97.5 fL (ref 80.0–100.0)
MEAN PLATELET VOLUME: 9.1 fL (ref 7.0–10.0)
NUCLEATED RED BLOOD CELLS: 4 /100{WBCs} (ref <=4)
RED BLOOD CELL COUNT: 2.99 10*12/L — ABNORMAL LOW (ref 4.00–5.20)
RED CELL DISTRIBUTION WIDTH: 20.8 % — ABNORMAL HIGH (ref 12.0–15.0)

## 2018-03-19 LAB — LACTATE DEHYDROGENASE
Lactate dehydrogenase:CCnc:Pt:Ser/Plas:Qn:: 2875 — ABNORMAL HIGH
Lactate dehydrogenase:CCnc:Pt:Ser/Plas:Qn:: 2986 — ABNORMAL HIGH
Lactate dehydrogenase:CCnc:Pt:Ser/Plas:Qn:: 3002 — ABNORMAL HIGH

## 2018-03-19 LAB — BLOOD GAS CRITICAL CARE PANEL, VENOUS
BASE EXCESS VENOUS: 1.5 (ref -2.0–2.0)
CALCIUM IONIZED VENOUS (MG/DL): 4.76 mg/dL (ref 4.40–5.40)
GLUCOSE WHOLE BLOOD: 240 mg/dL
HCO3 VENOUS: 26 mmol/L (ref 22–27)
HEMOGLOBIN BLOOD GAS: 8.4 g/dL — ABNORMAL LOW (ref 12.00–16.00)
O2 SATURATION VENOUS: 61.2 % (ref 40.0–85.0)
PCO2 VENOUS: 41 mmHg (ref 40–60)
PH VENOUS: 7.42 (ref 7.32–7.43)
PO2 VENOUS: 30 mmHg (ref 30–55)
POTASSIUM WHOLE BLOOD: 4.1 mmol/L (ref 3.4–4.6)
SODIUM WHOLE BLOOD: 144 mmol/L (ref 135–145)

## 2018-03-19 LAB — MAGNESIUM: Magnesium:MCnc:Pt:Ser/Plas:Qn:: 1.8

## 2018-03-19 LAB — FIBRINOGEN LEVEL: Lab: 382

## 2018-03-19 LAB — PRO-BNP: Natriuretic peptide.B prohormone N-Terminal:MCnc:Pt:Ser/Plas:Qn:: 4170 — ABNORMAL HIGH

## 2018-03-19 LAB — URIC ACID
Urate:MCnc:Pt:Ser/Plas:Qn:: 4.6
Urate:MCnc:Pt:Ser/Plas:Qn:: 4.7
Urate:MCnc:Pt:Ser/Plas:Qn:: 4.8

## 2018-03-19 LAB — PHOSPHORUS
PHOSPHORUS: 2.1 mg/dL — ABNORMAL LOW (ref 2.9–4.7)
Phosphate:MCnc:Pt:Ser/Plas:Qn:: 1.9 — ABNORMAL LOW
Phosphate:MCnc:Pt:Ser/Plas:Qn:: 2.1 — ABNORMAL LOW
Phosphate:MCnc:Pt:Ser/Plas:Qn:: 2.2 — ABNORMAL LOW

## 2018-03-19 LAB — FUNGITELL: Lab: <31

## 2018-03-19 LAB — PH VENOUS: pH:LsCnc:Pt:BldV:Qn:: 7.42

## 2018-03-19 LAB — EOSINOPHILS - REL (DIFF): Lab: 1

## 2018-03-19 LAB — TROPONIN I: Troponin I.cardiac:MCnc:Pt:Ser/Plas:Qn:: <0.034

## 2018-03-19 LAB — CO2: Carbon dioxide:SCnc:Pt:Ser/Plas:Qn:: 27

## 2018-03-19 LAB — MICROCYTES

## 2018-03-19 LAB — D-DIMER QUANTITATIVE (CH,ML,PD,ET): Lab: 8163 — ABNORMAL HIGH

## 2018-03-19 NOTE — Unmapped (Signed)
E1 Malignant Hematology Daily Progress Note    Interval History:  Had an apneic event during the night secondary to mucous plugging. Today the patient feels that her respiratory status is slightly worsen compared to the day prior. She denies dysuria, abd pain, N/V/D or headaches.       Assessment/Plan:  Principal Problem:    Pulmonary infiltrates  Active Problems:    MDS (myelodysplastic syndrome), high grade (CMS-HCC)    Acute myeloid leukemia not having achieved remission (CMS-HCC)    Anemia    COPD, mild (CMS-HCC)    Diabetes mellitus, type 2 (CMS-HCC)    Dyslipidemia    Essential (primary) hypertension    Paroxysmal atrial fibrillation (CMS-HCC)    OSA on CPAP    AKI (acute kidney injury) (CMS-HCC)    Jessica Wilson is a 77 y.o. female with relapsed AML s/p enasidinib who presented to [facility] with Pulmonary infiltrates concerning for pneumonia vs less likely differentiation syndrome.    Bilateral pulmonary infiltrates: Patient was incidentally found to have bilateral BLL infiltrates on abdominal imaging on 6/30, and was asymptomatic at the time. She subsequently developed cough and SOB which has not had substantial improvement with treatment with broad spectrum antibiotics at OSH. She is transferred to [facility] for higher level of care. Her CXR at admission still has remarkable bilateral infiltrates, but reassuringly she remains stable on RA without hypoxia. The differential remains broad including pneumonia, differentiation syndrome, pulmonary edema, and alveolar hemorrhage secondary to DIC. She has persistent symptoms despite treatment with broad spectrum antibiotics, so I suspect there could be multiple processes involved. She does not appear septic at this time. Pro-BNP is elevated, and although I do not think this is predominantly driven by CHF, she does have several cardiac risk factors. She does not appear to be in DIC right now, as she has had chronic thrombocytopenia, does not have active bleeding, and her coags are normal today. Regarding differentiation syndrome, this does occur in 10-19% of patients on Enasidenib, and she has been treated empirically in November of last year for abdominal pain and splenomegaly. This would be a late presentation of DS, which typically occurs within 5 months of initiation, however given her lack of improvement at the OSH she was started on decadron prior to transfer. She does have at least 3 recognized features of DS (although no definitive diagnostic criteria), which would give her 'moderate' disease. Fortunately, she does not have hypoxia or hypotension at this time.  -- I spoke with [place] about getting CT chest imaging transferred to Epic  -- F/u lower respiratory culture: grew Strep  -- Continue Cefepime  -- Continue to hold Enasidenib  -- Consider Pulmonary consult in the AM  -- F/u D-dimer - not currently concerned for PE given no LE swelling, no hypoxia or tachycardia  [ ]  7/8: taper Dex (6/29-) half every 2 days  [ ]  7/8: f/u blood cultures  [ ]  7/8: f//u fungal assay, mycoplasma, RVP   [ ]  7/8 PM: consider weaning off of [place]    Relapsed AML, transformed from previous MDS: Follows with Dr. Malen Gauze as an outpatient as well as Dr. Donneta Romberg locally in [place]. Has been on Enasidenib since 06/2017. See detailed history below. She has had recurrence of blasts and transaminitis as of 7/1, concerning for worsening progression of her disease. Considering change in therapy (cytarabine and venetoclax) in the future. Increased WBC and blast count on 7/9 concerning for relapse  -- Improved transaminitis, potentially was medication side  effect (doxy vs. 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   -- Add on phosphorus   -- Will start Allopurinol 300mg  daily  -- 500cc IVF bolus  -- q8h TLS labs  ??  AKI: Cr 1.55 with baseline seemingly ~1.0. Appears to have had several episodes of AKI in the past. Hypovolemia could be contributing given likely infection.   -- IVF bolus now, daily CMP, daily Mag    Chronic Medical Conditions:  Afib/HTN: Will monitor pressures overnight and consider restarting home diltiazem in the AM.  DM: SSI  COPD: Home ellipta, duonebs q6hrs while awake  Depression: Home Bupropion 150mg  BID and sertraline 100mg  daily  OSA: On CPAP at home, ordered as inpatient    Daily Checklist:  Diet: Regular Diet  DVT PPx: SCDs   GI PPx: Not Indicated  Electrolytes: No Repletion Needed  Code Status: DNR and DNI  Dispo: Admit to E1, floor status  ___________________________________________________________________         Oncology History   ?? Referring/Local Oncologist: Dr. Louretta Shorten, [facility]  ??  Diagnosis: MDS with Excess Blasts-2  ??  Genetics:              Karyotype/FISH: 46XX  ??              Molecular Genetics: not performed  ??  Pertinent Phenotypic data: no aberrant immunophenotype by flow cytometry, however blasts stained by IHC for CD117, MPO.  Only 5% marrow cells stained for CD34.  ??  Disease-specific prognostic estimation: IPSS-R high risk, median OS 1.6 years, with 25% AML risk at 1.4 years  ??  ??  ??   ??   MDS (myelodysplastic syndrome), high grade (CMS-HCC)   ?? 04/17/2016 Initial Diagnosis   ?? ?? MDS (myelodysplastic syndrome), high grade (RAF-HCC)  ??   ??   ?? 04/29/2016 -  Chemotherapy   ?? ?? Azacitidine cycle 1: 75 mg/m2 [place] days 1-7 of 28-day cycles  ??   ??   ?? 05/27/2016 -  Chemotherapy   ?? ?? Azacitidine cycle 2: 75 mg/m2 [place] days 1-7 of 28 day cycles  ??   ??   ?? 06/24/2016 -  Chemotherapy Azacitidine cycle 3: 75 mg/m2 [place] days 1-7 of 28 day cycle  ??   ??   ?? 07/22/2016 -  Chemotherapy   ?? ?? Azacitidine cycle 4: 75 mg/m2 [place] days 1-7 of 28 day cycle  ??   ??   ?? 08/19/2016 -  Chemotherapy   ?? ?? Azacitidine cycle 5: 75 mg/m2 [place] x 7 days of 28 day cycle  ??   ??   ?? 09/16/2016 -  Chemotherapy   ?? ?? Azacitidine cycle 6: 75 mbg/m2 [place] x 7 days of 28 day cycle  ??   ??   ?? 10/14/2016 -  Chemotherapy   ?? ?? Azacitidine cycle 7: 75 mg/m2 [place] x 7 days of  28 day cycle  ??   ??   ?? 12/09/2016 -  Chemotherapy   ?? ?? Azacitidine cycle 8: 60 mg/m2 [place] x 7 days of 28 day (cycle reduced by 20% due to hematologic toxicity)  ??   ??   ?? 01/13/2017 -  Chemotherapy   ?? ?? Azacitidine cycle 9: 60 mg/m2 [place] x 5 days of 28 day cycle (reduced by 2 days and 20% per dose due to hematologic toxicity)  ??   ??   ?? 05/29/2017 Progression   ?? ?? Given increasing transfusion requirements, repeat bone marrow biopsy done, now with 35% blasts and meets criteria for progression to AML.  ??   ??   ??   Acute myeloid leukemia not having achieved remission (CMS-HCC)   ?? 06/02/2017 Initial Diagnosis   ?? ?? Acute myeloid leukemia not having achieved remission  ??   ??   ?? 06/28/2017 - 07/17/2017 Chemotherapy   ?? ?? enasidenib 100 mg by mouth daily  ??   ??   ?? 07/17/2017 Adverse Reaction   ?? ?? Acute abdominal pain, splenomegaly, AKI , elevated transaminases.  Enasidenib held thru 11/13 and treated empirically for differentiation syndrome with dexamethasone. Resumed enasidenib 07/23/17  ??   ??   ?? 07/23/2017 -  Chemotherapy   ?? ?? enasidenib 100 mg PO daily     Allergies:  Macrobid [nitrofurantoin monohyd/m-cryst]    Medications:   Prior to Admission medications    Medication Dose, Route, Frequency   acetaminophen (TYLENOL) 325 MG tablet 650 mg, Oral, Every 6 hours PRN   acyclovir (ZOVIRAX) 400 MG tablet 400 mg, Oral, 2 times a day (standard)   buPROPion (WELLBUTRIN SR) 150 MG 12 hr tablet 2 tabs po daily   chlorhexidine (PERIDEX) 0.12 % solution 15 mL, Mouth, 2 times a day (standard) clonazePAM (KLONOPIN) 0.5 MG tablet 0.5 mg, Oral, 2 times a day PRN   diltiazem (CARDIZEM CD) 180 MG 24 hr capsule 180 mg, Oral, Daily (standard)   doxycycline (PERIOSTAT) 20 MG tablet 20 mg, Oral, 2 times a day (standard)   enasidenib (IDHIFA) 100 mg tablet 100 mg, Oral, Daily (standard)   lidocaine-prilocaine (EMLA) cream No dose, route, or frequency recorded.   prochlorperazine (COMPAZINE) 10 MG tablet 10 mg, Oral, Every 6 hours PRN   sertraline (ZOLOFT) 100 MG tablet 150 mg, Oral, Daily (standard)   tamsulosin (FLOMAX) 0.4 mg capsule 0.4 mg, Oral, Daily (standard)  Patient not taking: Reported on 03/09/2018   umeclidinium-vilanterol (ANORO ELLIPTA) 62.5-25 mcg/actuation inhaler 1 puff, Inhalation, Daily (standard)     Review of Systems:  10 systems reviewed and are negative unless otherwise mentioned in HPI    Physical Exam:  Temp:  [36.4 ??C-37.1 ??C] 37.1 ??C  Heart Rate:  [89-101] 97  Resp:  [20-22] 20  BP: (144-156)/(57-74) 147/57  SpO2:  [78 %-99 %] 78 %  Body mass index is 40.55 kg/m??.    General: Obese, elderly female sitting up at the side of bed, no acute distress.   HEENT: PERRL and EOMI,  CV: RRR, normal S1/S2, no murmurs appreciated  Lungs: Increased work of breathing after walking from the bathroom, on 2L [place], stable expiratory wheezing compared to prior  Abdominal: Obese, normal BS, soft, NTND  Extremities: No edema, warm and well perfused    Test Results:  Data Review:  I have reviewed the labs and studies from the last 24 hours.    Imaging: Radiology studies were personally reviewed

## 2018-03-19 NOTE — Unmapped (Signed)
Pt AxOx4, VSS, afebrile and free of falls/injuries this shift. Around 0400, pt called out stating she could not breathe. O2 sat at 78 on 2L [place]. MD notified and at bedside. 20mg  IV lasix given. EKG, troponin, and BNP ordered. Pt continued to drop and come back up. Pt now sustaining at mid 90's. Continuous pulse ox ordered for further monitoring. Pt anxious throughout this event but easily consoled. Pt resting with no other complaints/concerns. Will continue to monitor.

## 2018-03-19 NOTE — Telephone Encounter (Signed)
[facility] referring for lebial abscess. called on 03/17/18 Spoke with Patient Husband Jenny Reichmann about having patient to call back to schedule appt. He advised she was at [facility] and will have patient to call back to schedule. Left voicemail this morning for patient to call back to be schedule

## 2018-03-20 DIAGNOSIS — R918 Other nonspecific abnormal finding of lung field: Principal | ICD-10-CM

## 2018-03-20 LAB — LACTATE DEHYDROGENASE
Lactate dehydrogenase:CCnc:Pt:Ser/Plas:Qn:: 2488 — ABNORMAL HIGH
Lactate dehydrogenase:CCnc:Pt:Ser/Plas:Qn:: 2668 — ABNORMAL HIGH
Lactate dehydrogenase:CCnc:Pt:Ser/Plas:Qn:: 2838 — ABNORMAL HIGH

## 2018-03-20 LAB — MANUAL DIFFERENTIAL
BASOPHILS - ABS (DIFF): 0 10*9/L (ref 0.0–0.1)
BLASTS - REL (DIFF): 20 % (ref <=0)
EOSINOPHILS - ABS (DIFF): 0.2 10*9/L (ref 0.0–0.4)
EOSINOPHILS - REL (DIFF): 1 %
LYMPHOCYTES - ABS (DIFF): 1.9 10*9/L (ref 1.5–5.0)
LYMPHOCYTES - REL (DIFF): 10 %
MONOCYTES - ABS (DIFF): 5 10*9/L — ABNORMAL HIGH (ref 0.2–0.8)
MONOCYTES - REL (DIFF): 27 %
NEUTROPHILS - ABS (DIFF): 7.8 10*9/L — ABNORMAL HIGH (ref 2.0–7.5)

## 2018-03-20 LAB — URIC ACID
Urate:MCnc:Pt:Ser/Plas:Qn:: 4.1
Urate:MCnc:Pt:Ser/Plas:Qn:: 4.2
Urate:MCnc:Pt:Ser/Plas:Qn:: 4.9

## 2018-03-20 LAB — MONOCYTES - REL (DIFF): Lab: 27

## 2018-03-20 LAB — CBC W/ AUTO DIFF
HEMATOCRIT: 27 % — ABNORMAL LOW (ref 36.0–46.0)
HEMOGLOBIN: 8.5 g/dL — ABNORMAL LOW (ref 12.0–16.0)
MEAN CORPUSCULAR HEMOGLOBIN CONC: 31.5 g/dL (ref 31.0–37.0)
MEAN CORPUSCULAR HEMOGLOBIN: 30.2 pg (ref 26.0–34.0)
MEAN CORPUSCULAR VOLUME: 95.8 fL (ref 80.0–100.0)
MEAN PLATELET VOLUME: 9.3 fL (ref 7.0–10.0)
NUCLEATED RED BLOOD CELLS: 5 /100{WBCs} — ABNORMAL HIGH (ref <=4)
PLATELET COUNT: 21 10*9/L — ABNORMAL LOW (ref 150–440)
RED BLOOD CELL COUNT: 2.81 10*12/L — ABNORMAL LOW (ref 4.00–5.20)
RED CELL DISTRIBUTION WIDTH: 20.6 % — ABNORMAL HIGH (ref 12.0–15.0)
WBC ADJUSTED: 18.5 10*9/L — ABNORMAL HIGH (ref 4.5–11.0)

## 2018-03-20 LAB — COMPREHENSIVE METABOLIC PANEL
ALBUMIN: 3.4 g/dL — ABNORMAL LOW (ref 3.5–5.0)
ALT (SGPT): 27 U/L (ref 15–48)
ANION GAP: 7 mmol/L — ABNORMAL LOW (ref 9–15)
AST (SGOT): 27 U/L (ref 14–38)
BILIRUBIN TOTAL: 1.3 mg/dL — ABNORMAL HIGH (ref 0.0–1.2)
BLOOD UREA NITROGEN: 45 mg/dL — ABNORMAL HIGH (ref 7–21)
BUN / CREAT RATIO: 36
CALCIUM: 8.6 mg/dL (ref 8.5–10.2)
CHLORIDE: 101 mmol/L (ref 98–107)
CO2: 33 mmol/L — ABNORMAL HIGH (ref 22.0–30.0)
CREATININE: 1.26 mg/dL — ABNORMAL HIGH (ref 0.60–1.00)
EGFR CKD-EPI AA FEMALE: 48 mL/min/{1.73_m2} — ABNORMAL LOW (ref >=60)
POTASSIUM: 3.5 mmol/L (ref 3.5–5.0)
PROTEIN TOTAL: 6.1 g/dL — ABNORMAL LOW (ref 6.5–8.3)
SODIUM: 141 mmol/L (ref 135–145)

## 2018-03-20 LAB — PHOSPHORUS
Phosphate:MCnc:Pt:Ser/Plas:Qn:: 2.7 — ABNORMAL LOW
Phosphate:MCnc:Pt:Ser/Plas:Qn:: 3
Phosphate:MCnc:Pt:Ser/Plas:Qn:: 3

## 2018-03-20 LAB — D-DIMER QUANTITATIVE (CH,ML,PD,ET): Lab: 7452 — ABNORMAL HIGH

## 2018-03-20 LAB — FIBRINOGEN LEVEL: Lab: 283

## 2018-03-20 LAB — MAGNESIUM: Magnesium:MCnc:Pt:Ser/Plas:Qn:: 1.6

## 2018-03-20 LAB — MEAN CORPUSCULAR VOLUME: Lab: 95.8

## 2018-03-20 LAB — BILIRUBIN TOTAL: Bilirubin:MCnc:Pt:Ser/Plas:Qn:: 1.3 — ABNORMAL HIGH

## 2018-03-20 MED ORDER — SODIUM CHLORIDE 3 % IN NEBU
4.00 | INHALATION_SOLUTION | RESPIRATORY_TRACT | Status: DC
Start: ? — End: 2018-03-20

## 2018-03-20 MED ORDER — DIPHENHYDRAMINE HCL 50 MG/ML IJ SOLN
25.00 | INTRAMUSCULAR | Status: DC
Start: ? — End: 2018-03-20

## 2018-03-20 MED ORDER — SODIUM CHLORIDE 0.9 % IV SOLN
1000.00 | INTRAVENOUS | Status: DC
Start: ? — End: 2018-03-20

## 2018-03-20 MED ORDER — ALBUTEROL SULFATE (2.5 MG/3ML) 0.083% IN NEBU
2.50 | INHALATION_SOLUTION | RESPIRATORY_TRACT | Status: DC
Start: ? — End: 2018-03-20

## 2018-03-20 MED ORDER — METHYLPREDNISOLONE SODIUM SUCC 125 MG IJ SOLR
125.00 | INTRAMUSCULAR | Status: DC
Start: ? — End: 2018-03-20

## 2018-03-20 MED ORDER — MELATONIN 3 MG PO TABS
6.00 | ORAL_TABLET | ORAL | Status: DC
Start: ? — End: 2018-03-20

## 2018-03-20 MED ORDER — ENASIDENIB MESYLATE 100 MG PO TABS
100.00 | ORAL_TABLET | ORAL | Status: DC
Start: 2018-03-23 — End: 2018-03-20

## 2018-03-20 MED ORDER — FAMOTIDINE 20 MG/2ML IV SOLN
20.00 | INTRAVENOUS | Status: DC
Start: ? — End: 2018-03-20

## 2018-03-20 MED ORDER — POTASSIUM PHOSPHATE MONOBASIC 500 MG PO TABS
500.00 | ORAL_TABLET | ORAL | Status: DC
Start: 2018-03-20 — End: 2018-03-20

## 2018-03-20 MED ORDER — SODIUM CHLORIDE 0.9 % IV SOLN
20.00 | INTRAVENOUS | Status: DC
Start: ? — End: 2018-03-20

## 2018-03-20 MED ORDER — LEVOFLOXACIN 750 MG PO TABS
750.00 | ORAL_TABLET | ORAL | Status: DC
Start: 2018-03-21 — End: 2018-03-20

## 2018-03-20 MED ORDER — EPINEPHRINE 0.3 MG/0.3ML IJ SOAJ
0.30 | INTRAMUSCULAR | Status: DC
Start: ? — End: 2018-03-20

## 2018-03-20 MED ORDER — GENERIC EXTERNAL MEDICATION
Status: DC
Start: ? — End: 2018-03-20

## 2018-03-20 MED ORDER — MEPERIDINE HCL 25 MG/ML IJ SOLN
25.00 | INTRAMUSCULAR | Status: DC
Start: ? — End: 2018-03-20

## 2018-03-20 MED ORDER — HYDROXYUREA 500 MG PO CAPS
1000.00 | ORAL_CAPSULE | ORAL | Status: DC
Start: 2018-03-23 — End: 2018-03-20

## 2018-03-20 NOTE — Unmapped (Signed)
Pt AAO x4, afebrile, denies pain. Pt anxious at times and gets SOB with exertion. Pt  on O2-2L/[place] and always desats when OOB. Nurse stays with pt until O2 sats goes up and ongoing emotional support given. Family at bedside.

## 2018-03-20 NOTE — Unmapped (Signed)
Pt AxOx4, VSS, afebrile and free of falls/injuries this shift. 1 call from tele concerning a desat to low 70s when patient up to bedside commode. Due to patients oxygen decreasing only upon exertion, bed rest was maintained duration of shift with good results. 20mg  IV lasix given x1, patient reporting better movement of her lungs afterwards. Pt continues on 2L [place] and oral ABX. Pt resting with no other complaints/concerns. Will continue to monitor.

## 2018-03-20 NOTE — Telephone Encounter (Signed)
Called and left voice mail for patient to call back to be schedule °

## 2018-03-21 LAB — STONE ANALYSIS
Stone Weight KSTONE: 15.6 mg
Uric Acid: 100 %

## 2018-03-21 LAB — COMPREHENSIVE METABOLIC PANEL
ALBUMIN: 3.2 g/dL — ABNORMAL LOW (ref 3.5–5.0)
ALKALINE PHOSPHATASE: 77 U/L (ref 38–126)
ALT (SGPT): 26 U/L (ref 15–48)
ANION GAP: 10 mmol/L (ref 9–15)
AST (SGOT): 29 U/L (ref 14–38)
BILIRUBIN TOTAL: 1.2 mg/dL (ref 0.0–1.2)
BLOOD UREA NITROGEN: 53 mg/dL — ABNORMAL HIGH (ref 7–21)
BUN / CREAT RATIO: 39
CALCIUM: 8.3 mg/dL — ABNORMAL LOW (ref 8.5–10.2)
CHLORIDE: 101 mmol/L (ref 98–107)
CO2: 30 mmol/L (ref 22.0–30.0)
CREATININE: 1.35 mg/dL — ABNORMAL HIGH (ref 0.60–1.00)
EGFR CKD-EPI AA FEMALE: 44 mL/min/{1.73_m2} — ABNORMAL LOW (ref >=60)
EGFR CKD-EPI NON-AA FEMALE: 38 mL/min/{1.73_m2} — ABNORMAL LOW (ref >=60)
GLUCOSE RANDOM: 162 mg/dL (ref 65–179)
POTASSIUM: 3.2 mmol/L — ABNORMAL LOW (ref 3.5–5.0)
PROTEIN TOTAL: 5.9 g/dL — ABNORMAL LOW (ref 6.5–8.3)
SODIUM: 141 mmol/L (ref 135–145)

## 2018-03-21 LAB — CBC W/ AUTO DIFF
HEMATOCRIT: 26.8 % — ABNORMAL LOW (ref 36.0–46.0)
HEMOGLOBIN: 8.4 g/dL — ABNORMAL LOW (ref 12.0–16.0)
MEAN CORPUSCULAR HEMOGLOBIN CONC: 31.1 g/dL (ref 31.0–37.0)
MEAN CORPUSCULAR HEMOGLOBIN: 29.8 pg (ref 26.0–34.0)
MEAN PLATELET VOLUME: 8.2 fL (ref 7.0–10.0)
RED BLOOD CELL COUNT: 2.8 10*12/L — ABNORMAL LOW (ref 4.00–5.20)
WBC ADJUSTED: 20.3 10*9/L — ABNORMAL HIGH (ref 4.5–11.0)

## 2018-03-21 LAB — FIBRINOGEN LEVEL: Lab: 298

## 2018-03-21 LAB — URIC ACID
Urate:MCnc:Pt:Ser/Plas:Qn:: 4.6
Urate:MCnc:Pt:Ser/Plas:Qn:: 4.7
Urate:MCnc:Pt:Ser/Plas:Qn:: 4.8

## 2018-03-21 LAB — MANUAL DIFFERENTIAL
BASOPHILS - REL (DIFF): 0 %
BLASTS - REL (DIFF): 12 % (ref <=0)
EOSINOPHILS - ABS (DIFF): 0 10*9/L (ref 0.0–0.4)
EOSINOPHILS - REL (DIFF): 0 %
LYMPHOCYTES - ABS (DIFF): 1.8 10*9/L (ref 1.5–5.0)
LYMPHOCYTES - REL (DIFF): 9 %
MONOCYTES - ABS (DIFF): 8.1 10*9/L — ABNORMAL HIGH (ref 0.2–0.8)
MONOCYTES - REL (DIFF): 40 %
NEUTROPHILS - REL (DIFF): 39 %

## 2018-03-21 LAB — PHOSPHORUS
Phosphate:MCnc:Pt:Ser/Plas:Qn:: 3.7
Phosphate:MCnc:Pt:Ser/Plas:Qn:: 3.7
Phosphate:MCnc:Pt:Ser/Plas:Qn:: 3.8

## 2018-03-21 LAB — LACTATE DEHYDROGENASE
LACTATE DEHYDROGENASE: 2740 U/L — ABNORMAL HIGH (ref 338–610)
Lactate dehydrogenase:CCnc:Pt:Ser/Plas:Qn:: 2698 — ABNORMAL HIGH
Lactate dehydrogenase:CCnc:Pt:Ser/Plas:Qn:: 2740 — ABNORMAL HIGH

## 2018-03-21 LAB — GLUCOSE RANDOM: Glucose:MCnc:Pt:Ser/Plas:Qn:: 162

## 2018-03-21 LAB — MAGNESIUM: Magnesium:MCnc:Pt:Ser/Plas:Qn:: 1.5 — ABNORMAL LOW

## 2018-03-21 LAB — D-DIMER QUANTITATIVE (CH,ML,PD,ET): Lab: 6436 — ABNORMAL HIGH

## 2018-03-21 LAB — MONOCYTES - REL (DIFF): Lab: 40

## 2018-03-21 LAB — MEAN CORPUSCULAR VOLUME: Lab: 95.8

## 2018-03-21 NOTE — Unmapped (Signed)
Pt AxOx4, VSS, afebrile and free of falls/injuries this shift. Pt remains bedridden d/t severe desaturation when moving. UOP remains adequate. Pt desatted once this shift when CPAP was not on proper setting. 2 albuterol nebs given PRN. Pt has no other complaints/concerns. Bed in locked lowest position with bed alarm on. Will continue to monitor.

## 2018-03-21 NOTE — Unmapped (Signed)
E1 Malignant Hematology Daily Progress Note    Interval History:  CXR showing interval stable or slight improvement. Doing well on CPAP overnight. Feeling well this morning. SNF likely Monday. Tolerating levaquin well.      Assessment/Plan:  Principal Problem:    Streptococcal pneumonia (CMS-HCC)  Active Problems:    MDS (myelodysplastic syndrome), high grade (CMS-HCC)    Acute myeloid leukemia not having achieved remission (CMS-HCC)    Anemia    COPD, mild (CMS-HCC)    Diabetes mellitus, type 2 (CMS-HCC)    Dyslipidemia    Essential (primary) hypertension    Paroxysmal atrial fibrillation (CMS-HCC)    Pulmonary infiltrates    OSA on CPAP    AKI (acute kidney injury) (CMS-HCC)    Jessica Wilson is a 77 y.o. female with relapsed AML s/p enasidinib who presented to [facility] with Streptococcal pneumonia (CMS-HCC) concerning for pneumonia vs less likely differentiation syndrome.    Strep PNA:  Treated initially with cefepime. Narrowed to levaquin. Tolerating well. Weaning O2 as able.   - stopped dex today, not differentiation syndrome  - will complete 7 day course on 7/13    Relapsed AML, transformed from previous MDS: Follows with Dr. Malen Gauze as an outpatient as well as Dr. Donneta Romberg locally in [place]. Has been on Enasidenib since 06/2017. She has had recurrence of blasts and transaminitis as of 7/1, concerning for worsening progression of her disease. Considering change in therapy (cytarabine and venetoclax) in the future. Increased WBC and blast count on 7/9 concerning for relapse  -- WBC downtrending. 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   -- Add on phosphorus   -- Will start Allopurinol 300mg  daily  -- 500cc IVF bolus  -- q8h TLS labs  ??  AKI: Cr 1.55 with baseline seemingly ~1.0. Appears to have had several episodes of AKI in the past. Hypovolemia could be contributing given likely infection.   -- IVF bolus now, daily CMP, daily Mag    Chronic Medical Conditions:  Afib/HTN: Will monitor pressures overnight and consider restarting home diltiazem in the AM.  DM: SSI  COPD: Home ellipta, duonebs q6hrs while awake  Depression: Home Bupropion 150mg  BID and sertraline 100mg  daily  OSA: On CPAP at home, ordered as inpatient    Daily Checklist:  Diet: Regular Diet  DVT PPx: SCDs   GI PPx: Not Indicated  Electrolytes: No Repletion Needed  Code Status: DNR and DNI  Dispo: Admit to E1, floor status  ___________________________________________________________________         Oncology History   ?? Referring/Local Oncologist: Dr. Louretta Shorten, [facility]  ??  Diagnosis: MDS with Excess Blasts-2  ??  Genetics:              Karyotype/FISH: 46XX  ??              Molecular Genetics: not performed  ??  Pertinent Phenotypic data: no aberrant immunophenotype by flow cytometry, however blasts stained by IHC for CD117, MPO.  Only 5% marrow cells stained for CD34.  ??  Disease-specific prognostic estimation: IPSS-R high risk, median OS 1.6 years, with 25% AML risk at 1.4 years  ??  ??  ??   ??  MDS (myelodysplastic syndrome), high grade (CMS-HCC)   ?? 04/17/2016 Initial Diagnosis   ?? ?? MDS (myelodysplastic syndrome), high grade (RAF-HCC)  ??   ??   ?? 04/29/2016 -  Chemotherapy   ?? ?? Azacitidine cycle 1: 75 mg/m2 [place] days 1-7 of 28-day cycles  ??   ??   ?? 05/27/2016 -  Chemotherapy   ?? ?? Azacitidine cycle 2: 75 mg/m2 [place] days 1-7 of 28 day cycles  ??   ??   ?? 06/24/2016 -  Chemotherapy   ?? ?? Azacitidine cycle 3: 75 mg/m2 [place] days 1-7 of 28 day cycle  ??   ??   ?? 07/22/2016 -  Chemotherapy   ?? ?? Azacitidine cycle 4: 75 mg/m2 [place] days 1-7 of 28 day cycle  ??   ??   ?? 08/19/2016 -  Chemotherapy   ?? ?? Azacitidine cycle 5: 75 mg/m2 [place] x 7 days of 28 day cycle  ??   ??   ?? 09/16/2016 -  Chemotherapy   ?? ?? Azacitidine cycle 6: 75 mbg/m2 [place] x 7 days of 28 day cycle  ??   ??   ?? 10/14/2016 -  Chemotherapy   ?? ?? Azacitidine cycle 7: 75 mg/m2 [place] x 7 days of 28 day cycle  ??   ??   ?? 12/09/2016 -  Chemotherapy   ?? ?? Azacitidine cycle 8: 60 mg/m2 [place] x 7 days of 28 day (cycle reduced by 20% due to hematologic toxicity)  ??   ??   ?? 01/13/2017 -  Chemotherapy   ?? ?? Azacitidine cycle 9: 60 mg/m2  x 5 days of 28 day cycle (reduced by 2 days and 20% per dose due to hematologic toxicity)  ??   ??   ?? 05/29/2017 Progression   ?? ?? Given increasing transfusion requirements, repeat bone marrow biopsy done, now with 35% blasts and meets criteria for progression to AML.  ??   ??   ??   Acute myeloid leukemia not having achieved remission (CMS-HCC)   ?? 06/02/2017 Initial Diagnosis   ?? ?? Acute myeloid leukemia not having achieved remission  ??   ??   ?? 06/28/2017 - 07/17/2017 Chemotherapy   ?? ?? enasidenib 100 mg by mouth daily  ??   ??   ?? 07/17/2017 Adverse Reaction   ?? ?? Acute abdominal pain, splenomegaly, AKI , elevated transaminases.  Enasidenib held thru 11/13 and treated empirically for differentiation syndrome with dexamethasone. Resumed enasidenib 07/23/17  ??   ??   ?? 07/23/2017 -  Chemotherapy   ?? ?? enasidenib 100 mg PO daily     Allergies:  Macrobid [nitrofurantoin monohyd/m-cryst]    Medications:   Prior to Admission medications    Medication Dose, Route, Frequency   buPROPion (WELLBUTRIN SR) 150 MG 12 hr tablet 150 mg, Oral, 2 times a day (standard)   acetaminophen (TYLENOL) 325 MG tablet 650 mg, Oral, Every 6 hours PRN   acyclovir (ZOVIRAX) 400 MG tablet 400 mg, Oral, 2 times a day (standard)   chlorhexidine (PERIDEX) 0.12 % solution 15 mL, Mouth, 2 times a day (standard)   clonazePAM (KLONOPIN) 0.5 MG tablet 0.5 mg, Oral, 2 times a day PRN   diltiazem (CARDIZEM CD) 180 MG 24 hr capsule 180 mg, Oral, Daily (standard)   enasidenib (IDHIFA) 100 mg tablet 100 mg, Oral, Daily (standard)   sertraline (ZOLOFT) 100 MG tablet 100 mg, Oral, Daily (standard) umeclidinium-vilanterol (ANORO ELLIPTA) 62.5-25 mcg/actuation inhaler 1 puff, Inhalation, Daily (  standard)     Review of Systems:  10 systems reviewed and are negative unless otherwise mentioned in HPI    Physical Exam:  Temp:  [36.9 ??C-37.1 ??C] 37.1 ??C  Heart Rate:  [80-86] 86  Resp:  [18-20] 18  BP: (122-145)/(46-58) 122/49  SpO2:  [94 %-97 %] 94 %  Body mass index is 38.4 kg/m??.    General: Obese, elderly female sitting up at the side of bed, no acute distress.   HEENT: PERRL and EOMI,  CV: RRR, normal S1/S2, no murmurs appreciated  Lungs: Much improved lung sounds with interval decrease of expiratory wheezing  Abdominal: Obese, normal BS, soft, NTND  Extremities: No edema, warm and well perfused    Test Results:  Data Review:  I have reviewed the labs and studies from the last 24 hours.    Imaging: Radiology studies were personally reviewed

## 2018-03-21 NOTE — Unmapped (Signed)
E1 Malignant Hematology Daily Progress Note    Interval History:  Has had multiple desats to 70s last two nights that have resolved spontaneously within minutes. Usually during sleep. Hasn't been wearing home CPAP for unclear reason. Instructed to wear CPAP. SNF likely Monday. Tolerating levaquin well.       Assessment/Plan:  Principal Problem:    Pulmonary infiltrates  Active Problems:    Acute myeloid leukemia not having achieved remission (CMS-HCC)    AKI (acute kidney injury) (CMS-HCC)    MDS (myelodysplastic syndrome), high grade (CMS-HCC)    Anemia    COPD, mild (CMS-HCC)    Diabetes mellitus, type 2 (CMS-HCC)    Dyslipidemia    Essential (primary) hypertension    Paroxysmal atrial fibrillation (CMS-HCC)    OSA on CPAP    Jessica Wilson is a 77 y.o. female with relapsed AML s/p enasidinib who presented to [facility] with Pulmonary infiltrates concerning for pneumonia vs less likely differentiation syndrome.    Strep PNA:  Treated initially with cefepime. Narrowed to levaquin. Tolerating well. Weaning O2 as able.   - stopped dex today, not differentiation syndrome  - will complete 7 day course on 7/13    Relapsed AML, transformed from previous MDS: Follows with Dr. Malen Gauze as an outpatient as well as Dr. Donneta Romberg locally in [place]. Has been on Enasidenib since 06/2017. She has had recurrence of blasts and transaminitis as of 7/1, concerning for worsening progression of her disease. Considering change in therapy (cytarabine and venetoclax) in the future. Increased WBC and blast count on 7/9 concerning for relapse  -- WBC downtrending. 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   -- Add on phosphorus   -- Will start Allopurinol 300mg  daily  -- 500cc IVF bolus  -- q8h TLS labs AKI: Cr 1.55 with baseline seemingly ~1.0. Appears to have had several episodes of AKI in the past. Hypovolemia could be contributing given likely infection.   -- IVF bolus now, daily CMP, daily Mag    Chronic Medical Conditions:  Afib/HTN: Will monitor pressures overnight and consider restarting home diltiazem in the AM.  DM: SSI  COPD: Home ellipta, duonebs q6hrs while awake  Depression: Home Bupropion 150mg  BID and sertraline 100mg  daily  OSA: On CPAP at home, ordered as inpatient    Daily Checklist:  Diet: Regular Diet  DVT PPx: SCDs   GI PPx: Not Indicated  Electrolytes: No Repletion Needed  Code Status: DNR and DNI  Dispo: Admit to E1, floor status  ___________________________________________________________________         Oncology History   ?? Referring/Local Oncologist: Dr. Louretta Shorten, [facility]   ??  Diagnosis: MDS with Excess Blasts-2  ??  Genetics:              Karyotype/FISH: 46XX  ??              Molecular Genetics: not performed  ??  Pertinent Phenotypic data: no aberrant immunophenotype by flow cytometry, however blasts stained by IHC for CD117, MPO.  Only 5% marrow cells stained for CD34.  ??  Disease-specific prognostic estimation: IPSS-R high risk, median OS 1.6 years, with 25% AML risk at 1.4 years  ??  ??  ??   ??  MDS (myelodysplastic syndrome), high grade (CMS-HCC)   ?? 04/17/2016 Initial Diagnosis   ?? ?? MDS (myelodysplastic syndrome), high grade (RAF-HCC)  ??   ??   ?? 04/29/2016 -  Chemotherapy   ?? ?? Azacitidine cycle 1: 75 mg/m2 [place] days 1-7 of 28-day cycles  ??   ??   ?? 05/27/2016 -  Chemotherapy   ?? ?? Azacitidine cycle 2: 75 mg/m2 [place] days 1-7 of 28 day cycles  ??   ??   ?? 06/24/2016 -  Chemotherapy   ?? ?? Azacitidine cycle 3: 75 mg/m2 [place] days 1-7 of 28 day cycle  ??   ??   ?? 07/22/2016 -  Chemotherapy   ?? ?? Azacitidine cycle 4: 75 mg/m2 [place] days 1-7 of 28 day cycle  ??   ??   ?? 08/19/2016 -  Chemotherapy   ?? ?? Azacitidine cycle 5: 75 mg/m2 [place] x 7 days of 28 day cycle  ??   ??   ?? 09/16/2016 - Chemotherapy   ?? ?? Azacitidine cycle 6: 75 mbg/m2 [place] x 7 days of 28 day cycle  ??   ??   ?? 10/14/2016 -  Chemotherapy   ?? ?? Azacitidine cycle 7: 75 mg/m2 [place] x 7 days of 28 day cycle  ??   ??   ?? 12/09/2016 -  Chemotherapy   ?? ?? Azacitidine cycle 8: 60 mg/m2 [place] x 7 days of 28 day (cycle reduced by 20% due to hematologic toxicity)  ??   ??   ?? 01/13/2017 -  Chemotherapy   ?? ?? Azacitidine cycle 9: 60 mg/m2 [place] x 5 days of 28 day cycle (reduced by 2 days and 20% per dose due to hematologic toxicity)  ??   ??   ?? 05/29/2017 Progression   ?? ?? Given increasing transfusion requirements, repeat bone marrow biopsy done, now with 35% blasts and meets criteria for progression to AML.  ??   ??   ??   Acute myeloid leukemia not having achieved remission (CMS-HCC)   ?? 06/02/2017 Initial Diagnosis   ?? ?? Acute myeloid leukemia not having achieved remission  ??   ??   ?? 06/28/2017 - 07/17/2017 Chemotherapy   ?? ?? enasidenib 100 mg by mouth daily  ??   ??   ?? 07/17/2017 Adverse Reaction   ?? ?? Acute abdominal pain, splenomegaly, AKI , elevated transaminases.  Enasidenib held thru 11/13 and treated empirically for differentiation syndrome with dexamethasone. Resumed enasidenib 07/23/17  ??   ??   ?? 07/23/2017 -  Chemotherapy   ?? ?? enasidenib 100 mg PO daily     Allergies:  Macrobid [nitrofurantoin monohyd/m-cryst]    Medications:   Prior to Admission medications    Medication Dose, Route, Frequency   buPROPion (WELLBUTRIN SR) 150 MG 12 hr tablet 150 mg, Oral, 2 times a day (standard)   acetaminophen (TYLENOL) 325 MG tablet 650 mg, Oral, Every 6 hours PRN   acyclovir (ZOVIRAX) 400 MG tablet 400 mg, Oral, 2 times a day (standard)   chlorhexidine (PERIDEX) 0.12 % solution 15 mL, Mouth, 2 times a day (standard)   clonazePAM (KLONOPIN) 0.5 MG tablet 0.5 mg, Oral, 2 times a day PRN   diltiazem (CARDIZEM CD) 180 MG 24 hr capsule 180 mg, Oral, Daily (standard)   enasidenib (IDHIFA) 100 mg tablet 100 mg, Oral, Daily (standard)   sertraline (ZOLOFT) 100 MG tablet 100 mg, Oral, Daily (standard)   umeclidinium-vilanterol (ANORO ELLIPTA) 62.5-25 mcg/actuation inhaler 1 puff, Inhalation,  Daily (standard)     Review of Systems:  10 systems reviewed and are negative unless otherwise mentioned in HPI    Physical Exam:  Temp:  [36.5 ??C-37.4 ??C] 37.1 ??C  Heart Rate:  [70-93] 82  Resp:  [18-20] 20  BP: (117-141)/(43-59) 141/58  SpO2:  [93 %-96 %] 95 %  Body mass index is 41.17 kg/m??.    General: Obese, elderly female sitting up at the side of bed, no acute distress.   HEENT: PERRL and EOMI,  CV: RRR, normal S1/S2, no murmurs appreciated  Lungs: Increased work of breathing after walking from the bathroom, on 2L [place], stable expiratory wheezing compared to prior  Abdominal: Obese, normal BS, soft, NTND  Extremities: No edema, warm and well perfused    Test Results:  Data Review:  I have reviewed the labs and studies from the last 24 hours.    Imaging: Radiology studies were personally reviewed

## 2018-03-22 LAB — COMPREHENSIVE METABOLIC PANEL
ALBUMIN: 3 g/dL — ABNORMAL LOW (ref 3.5–5.0)
ALKALINE PHOSPHATASE: 67 U/L (ref 38–126)
ALT (SGPT): 21 U/L (ref 15–48)
ANION GAP: 6 mmol/L — ABNORMAL LOW (ref 9–15)
AST (SGOT): 24 U/L (ref 14–38)
BILIRUBIN TOTAL: 1.1 mg/dL (ref 0.0–1.2)
BLOOD UREA NITROGEN: 63 mg/dL — ABNORMAL HIGH (ref 7–21)
BUN / CREAT RATIO: 38
CALCIUM: 8.2 mg/dL — ABNORMAL LOW (ref 8.5–10.2)
CHLORIDE: 106 mmol/L (ref 98–107)
CREATININE: 1.68 mg/dL — ABNORMAL HIGH (ref 0.60–1.00)
EGFR CKD-EPI AA FEMALE: 34 mL/min/{1.73_m2} — ABNORMAL LOW (ref >=60)
EGFR CKD-EPI NON-AA FEMALE: 29 mL/min/{1.73_m2} — ABNORMAL LOW (ref >=60)
GLUCOSE RANDOM: 181 mg/dL — ABNORMAL HIGH (ref 65–179)
PROTEIN TOTAL: 5.7 g/dL — ABNORMAL LOW (ref 6.5–8.3)
SODIUM: 139 mmol/L (ref 135–145)

## 2018-03-22 LAB — FIBRINOGEN LEVEL: Lab: 298

## 2018-03-22 LAB — CBC W/ AUTO DIFF
HEMATOCRIT: 24.6 % — ABNORMAL LOW (ref 36.0–46.0)
MEAN CORPUSCULAR HEMOGLOBIN CONC: 31.8 g/dL (ref 31.0–37.0)
MEAN CORPUSCULAR HEMOGLOBIN: 30.6 pg (ref 26.0–34.0)
MEAN CORPUSCULAR VOLUME: 96.4 fL (ref 80.0–100.0)
MEAN PLATELET VOLUME: 8.4 fL (ref 7.0–10.0)
NUCLEATED RED BLOOD CELLS: 9 /100{WBCs} — ABNORMAL HIGH (ref <=4)
PLATELET COUNT: 15 10*9/L — ABNORMAL LOW (ref 150–440)
RED BLOOD CELL COUNT: 2.56 10*12/L — ABNORMAL LOW (ref 4.00–5.20)
RED CELL DISTRIBUTION WIDTH: 21 % — ABNORMAL HIGH (ref 12.0–15.0)
WBC ADJUSTED: 17 10*9/L — ABNORMAL HIGH (ref 4.5–11.0)

## 2018-03-22 LAB — MAGNESIUM: Magnesium:MCnc:Pt:Ser/Plas:Qn:: 1.6

## 2018-03-22 LAB — MANUAL DIFFERENTIAL
BLASTS - REL (DIFF): 18 % (ref <=0)
LYMPHOCYTES - ABS (DIFF): 1.7 10*9/L (ref 1.5–5.0)
LYMPHOCYTES - REL (DIFF): 10 %
MONOCYTES - REL (DIFF): 35 %
NEUTROPHILS - REL (DIFF): 37 %

## 2018-03-22 LAB — HYPOCHROMIA

## 2018-03-22 LAB — HEPATIC FUNCTION PANEL
ALBUMIN: 3 g/dL — ABNORMAL LOW (ref 3.5–5.0)
ALKALINE PHOSPHATASE: 67 U/L (ref 38–126)
AST (SGOT): 24 U/L (ref 14–38)
BILIRUBIN DIRECT: 0.2 mg/dL (ref 0.00–0.40)
BILIRUBIN TOTAL: 1.1 mg/dL (ref 0.0–1.2)

## 2018-03-22 LAB — D-DIMER QUANTITATIVE (CH,ML,PD,ET): Lab: 5441 — ABNORMAL HIGH

## 2018-03-22 LAB — LACTATE DEHYDROGENASE
LACTATE DEHYDROGENASE: 2186 U/L — ABNORMAL HIGH (ref 338–610)
Lactate dehydrogenase:CCnc:Pt:Ser/Plas:Qn:: 2186 — ABNORMAL HIGH
Lactate dehydrogenase:CCnc:Pt:Ser/Plas:Qn:: 2328 — ABNORMAL HIGH
Lactate dehydrogenase:CCnc:Pt:Ser/Plas:Qn:: 2416 — ABNORMAL HIGH
Lactate dehydrogenase:CCnc:Pt:Ser/Plas:Qn:: 2600 — ABNORMAL HIGH

## 2018-03-22 LAB — PHOSPHORUS
Phosphate:MCnc:Pt:Ser/Plas:Qn:: 4.4
Phosphate:MCnc:Pt:Ser/Plas:Qn:: 4.6
Phosphate:MCnc:Pt:Ser/Plas:Qn:: 4.7

## 2018-03-22 LAB — PRO-BNP: Natriuretic peptide.B prohormone N-Terminal:MCnc:Pt:Ser/Plas:Qn:: 687 — ABNORMAL HIGH

## 2018-03-22 LAB — URIC ACID
Urate:MCnc:Pt:Ser/Plas:Qn:: 5.2
Urate:MCnc:Pt:Ser/Plas:Qn:: 5.4
Urate:MCnc:Pt:Ser/Plas:Qn:: 5.7

## 2018-03-22 LAB — ALKALINE PHOSPHATASE: Alkaline phosphatase:CCnc:Pt:Ser/Plas:Qn:: 67

## 2018-03-22 LAB — BUN / CREAT RATIO: Urea nitrogen/Creatinine:MRto:Pt:Ser/Plas:Qn:: 38

## 2018-03-22 LAB — LYMPHOCYTES - REL (DIFF): Lab: 10

## 2018-03-22 MED ORDER — GENERIC EXTERNAL MEDICATION
Status: DC
Start: ? — End: 2018-03-22

## 2018-03-22 NOTE — Unmapped (Signed)
Pt afebrile, VSS, denies pain and or nausea.  Pt did not need any replacements.  No issues overnight.  Possible DC to SNF Monday. WCM.  Pt to get OOB to chair today.

## 2018-03-22 NOTE — Unmapped (Signed)
E1 Malignant Hematology Daily Progress Note    Interval History:  Completed antibiotic course. Feeling well this morning. SNF likely Monday.      Assessment/Plan:  Principal Problem:    Streptococcal pneumonia (CMS-HCC)  Active Problems:    MDS (myelodysplastic syndrome), high grade (CMS-HCC)    Acute myeloid leukemia not having achieved remission (CMS-HCC)    Anemia    COPD, mild (CMS-HCC)    Diabetes mellitus, type 2 (CMS-HCC)    Dyslipidemia    Essential (primary) hypertension    Paroxysmal atrial fibrillation (CMS-HCC)    Pulmonary infiltrates    OSA on CPAP    AKI (acute kidney injury) (CMS-HCC)    Jessica Wilson is a 77 y.o. female with relapsed AML s/p enasidinib who presented to [facility] with Streptococcal pneumonia (CMS-HCC) concerning for pneumonia vs less likely differentiation syndrome.    Strep PNA:  Treated initially with cefepime. Narrowed to levaquin.Weaned O2 as able.   -- Completed antibiotic course 7/13    Relapsed AML, transformed from previous MDS: Follows with Dr. Malen Gauze as an outpatient as well as Dr. Donneta Romberg locally in [place]. Has been on Enasidenib since 06/2017. She has had recurrence of blasts and transaminitis as of 7/1, concerning for worsening progression of her disease. Considering change in therapy (cytarabine and venetoclax) in the future. Increased WBC and blast count on 7/9 concerning for relapse  -- WBC downtrending. 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   -- Add on phosphorus   -- Will start Allopurinol 300mg  daily  -- 500cc IVF bolus  -- q8h TLS labs  ??  AKI: Cr 1.55 with baseline seemingly ~1.0. Appears to have had several episodes of AKI in the past. Hypovolemia could be contributing given likely infection.   -- IVF bolus now, daily CMP, daily Mag  -- f/u BMP on Monday 7/15 prior to discharge    Chronic Medical Conditions:  Afib/HTN: Will monitor pressures overnight and consider restarting home diltiazem in the AM.  DM: SSI  COPD: Home ellipta, duonebs q6hrs while awake  Depression: Home Bupropion 150mg  BID and sertraline 100mg  daily  OSA: On CPAP at home, ordered as inpatient    Daily Checklist:  Diet: Regular Diet  DVT PPx: SCDs   GI PPx: Not Indicated  Electrolytes: No Repletion Needed  Code Status: DNR and DNI  Dispo: Admit to E1, floor status  ___________________________________________________________________         Oncology History   ?? Referring/Local Oncologist: Dr. Louretta Shorten, [facility]  ??  Diagnosis: MDS with Excess Blasts-2  ??  Genetics:              Karyotype/FISH: 46XX  ??              Molecular Genetics: not performed  ??  Pertinent Phenotypic data: no aberrant immunophenotype by flow cytometry, however blasts stained by IHC for CD117, MPO.  Only 5% marrow cells stained for CD34.  ??  Disease-specific prognostic estimation: IPSS-R high risk, median OS 1.6 years, with 25% AML risk at 1.4 years  ??  ??  ??   ??   MDS (myelodysplastic syndrome), high grade (CMS-HCC)   ?? 04/17/2016 Initial Diagnosis   ?? ??  MDS (myelodysplastic syndrome), high grade (RAF-HCC)  ??   ??   ?? 04/29/2016 -  Chemotherapy   ?? ?? Azacitidine cycle 1: 75 mg/m2 [place] days 1-7 of 28-day cycles  ??   ??   ?? 05/27/2016 -  Chemotherapy   ?? ?? Azacitidine cycle 2: 75 mg/m2 [place] days 1-7 of 28 day cycles  ??   ??   ?? 06/24/2016 -  Chemotherapy   ?? ?? Azacitidine cycle 3: 75 mg/m2 [place] days 1-7 of 28 day cycle  ??   ??   ?? 07/22/2016 -  Chemotherapy   ?? ?? Azacitidine cycle 4: 75 mg/m2 [place] days 1-7 of 28 day cycle  ??   ??   ?? 08/19/2016 -  Chemotherapy   ?? ?? Azacitidine cycle 5: 75 mg/m2 [place] x 7 days of 28 day cycle  ??   ??   ?? 09/16/2016 -  Chemotherapy   ?? ?? Azacitidine cycle 6: 75 mbg/m2 [place] x 7 days of 28 day cycle  ??   ??   ?? 10/14/2016 -  Chemotherapy   ?? ?? Azacitidine cycle 7: 75 mg/m2 [place] x 7 days of 28 day cycle  ??   ??   ?? 12/09/2016 -  Chemotherapy   ?? ?? Azacitidine cycle 8: 60 mg/m2 [place] x 7 days of 28 day (cycle reduced by 20% due to hematologic toxicity)  ??   ??   ?? 01/13/2017 -  Chemotherapy   ?? ?? Azacitidine cycle 9: 60 mg/m2  x 5 days of 28 day cycle (reduced by 2 days and 20% per dose due to hematologic toxicity)  ??   ??   ?? 05/29/2017 Progression   ?? ?? Given increasing transfusion requirements, repeat bone marrow biopsy done, now with 35% blasts and meets criteria for progression to AML.  ??   ??   ??   Acute myeloid leukemia not having achieved remission (CMS-HCC)   ?? 06/02/2017 Initial Diagnosis   ?? ?? Acute myeloid leukemia not having achieved remission  ??   ??   ?? 06/28/2017 - 07/17/2017 Chemotherapy   ?? ?? enasidenib 100 mg by mouth daily  ??   ??   ?? 07/17/2017 Adverse Reaction   ?? ?? Acute abdominal pain, splenomegaly, AKI , elevated transaminases.  Enasidenib held thru 11/13 and treated empirically for differentiation syndrome with dexamethasone. Resumed enasidenib 07/23/17  ??   ??   ?? 07/23/2017 -  Chemotherapy   ?? ?? enasidenib 100 mg PO daily     Allergies:  Macrobid [nitrofurantoin monohyd/m-cryst]    Medications:   Prior to Admission medications    Medication Dose, Route, Frequency   buPROPion (WELLBUTRIN SR) 150 MG 12 hr tablet 150 mg, Oral, 2 times a day (standard)   acetaminophen (TYLENOL) 325 MG tablet 650 mg, Oral, Every 6 hours PRN   acyclovir (ZOVIRAX) 400 MG tablet 400 mg, Oral, 2 times a day (standard)   chlorhexidine (PERIDEX) 0.12 % solution 15 mL, Mouth, 2 times a day (standard)   clonazePAM (KLONOPIN) 0.5 MG tablet 0.5 mg, Oral, 2 times a day PRN   diltiazem (CARDIZEM CD) 180 MG 24 hr capsule 180 mg, Oral, Daily (standard)   enasidenib (IDHIFA) 100 mg tablet 100 mg, Oral, Daily (standard)   sertraline (ZOLOFT) 100 MG tablet 100 mg, Oral, Daily (standard)   umeclidinium-vilanterol (ANORO ELLIPTA) 62.5-25 mcg/actuation inhaler 1 puff, Inhalation, Daily (standard)     Review of Systems:  10 systems reviewed and are  negative unless otherwise mentioned in HPI    Physical Exam:  Temp:  [36.6 ??C-36.9 ??C] 36.9 ??C  Heart Rate:  [75-81] 81  Resp:  [16-20] 16  BP: (105-123)/(43-59) 106/43  SpO2:  [92 %-97 %] 92 %  Body mass index is 38.4 kg/m??.    General: Obese, elderly female sitting up at the side of bed, no acute distress.   HEENT: PERRL and EOMI,  CV: RRR, normal S1/S2, no murmurs appreciated  Lungs: Minimal expiratory wheezing around lung bases  Abdominal: Obese, normal BS, soft, NTND  Extremities: No edema, warm and well perfused    Test Results:  Data Review:  I have reviewed the labs and studies from the last 24 hours.    Imaging: Radiology studies were personally reviewed

## 2018-03-22 NOTE — Unmapped (Signed)
Pt A&Ox4, VSS throughout shift. Pt c/o n/v, PRN zofran given x1 w/ good effect. No c/o pain or SOB. Pt able to ambulate to [facility] w/ x1 assist w/ minimal desat to 86% and self corrects to over 90% quickly. Plan for possible SNF placement Monday. Pt husband at bedside. No falls or injuries, safety maintained, and will continue to monitor.

## 2018-03-23 ENCOUNTER — Other Ambulatory Visit: Payer: Self-pay | Admitting: Radiology

## 2018-03-23 ENCOUNTER — Telehealth: Payer: Self-pay | Admitting: Radiology

## 2018-03-23 DIAGNOSIS — R279 Unspecified lack of coordination: Secondary | ICD-10-CM | POA: Diagnosis not present

## 2018-03-23 DIAGNOSIS — I959 Hypotension, unspecified: Secondary | ICD-10-CM | POA: Diagnosis not present

## 2018-03-23 DIAGNOSIS — Z66 Do not resuscitate: Secondary | ICD-10-CM | POA: Diagnosis not present

## 2018-03-23 DIAGNOSIS — N179 Acute kidney failure, unspecified: Secondary | ICD-10-CM | POA: Diagnosis not present

## 2018-03-23 DIAGNOSIS — J189 Pneumonia, unspecified organism: Secondary | ICD-10-CM | POA: Diagnosis not present

## 2018-03-23 DIAGNOSIS — J13 Pneumonia due to Streptococcus pneumoniae: Secondary | ICD-10-CM | POA: Diagnosis not present

## 2018-03-23 DIAGNOSIS — N201 Calculus of ureter: Secondary | ICD-10-CM

## 2018-03-23 DIAGNOSIS — C9000 Multiple myeloma not having achieved remission: Secondary | ICD-10-CM | POA: Diagnosis not present

## 2018-03-23 DIAGNOSIS — J440 Chronic obstructive pulmonary disease with acute lower respiratory infection: Secondary | ICD-10-CM | POA: Diagnosis not present

## 2018-03-23 DIAGNOSIS — J439 Emphysema, unspecified: Secondary | ICD-10-CM | POA: Diagnosis not present

## 2018-03-23 DIAGNOSIS — C9200 Acute myeloblastic leukemia, not having achieved remission: Secondary | ICD-10-CM | POA: Diagnosis not present

## 2018-03-23 DIAGNOSIS — D696 Thrombocytopenia, unspecified: Secondary | ICD-10-CM | POA: Diagnosis not present

## 2018-03-23 DIAGNOSIS — M109 Gout, unspecified: Secondary | ICD-10-CM | POA: Diagnosis not present

## 2018-03-23 DIAGNOSIS — Z515 Encounter for palliative care: Secondary | ICD-10-CM | POA: Diagnosis not present

## 2018-03-23 DIAGNOSIS — J9601 Acute respiratory failure with hypoxia: Secondary | ICD-10-CM | POA: Diagnosis not present

## 2018-03-23 DIAGNOSIS — I10 Essential (primary) hypertension: Secondary | ICD-10-CM | POA: Diagnosis not present

## 2018-03-23 DIAGNOSIS — B953 Streptococcus pneumoniae as the cause of diseases classified elsewhere: Secondary | ICD-10-CM | POA: Diagnosis not present

## 2018-03-23 DIAGNOSIS — R5381 Other malaise: Secondary | ICD-10-CM | POA: Diagnosis not present

## 2018-03-23 DIAGNOSIS — J90 Pleural effusion, not elsewhere classified: Secondary | ICD-10-CM | POA: Diagnosis not present

## 2018-03-23 DIAGNOSIS — J9621 Acute and chronic respiratory failure with hypoxia: Secondary | ICD-10-CM | POA: Diagnosis not present

## 2018-03-23 DIAGNOSIS — E785 Hyperlipidemia, unspecified: Secondary | ICD-10-CM | POA: Diagnosis not present

## 2018-03-23 DIAGNOSIS — J910 Malignant pleural effusion: Secondary | ICD-10-CM | POA: Diagnosis not present

## 2018-03-23 DIAGNOSIS — R52 Pain, unspecified: Secondary | ICD-10-CM | POA: Diagnosis not present

## 2018-03-23 DIAGNOSIS — N189 Chronic kidney disease, unspecified: Secondary | ICD-10-CM | POA: Diagnosis not present

## 2018-03-23 DIAGNOSIS — C92A0 Acute myeloid leukemia with multilineage dysplasia, not having achieved remission: Secondary | ICD-10-CM | POA: Diagnosis not present

## 2018-03-23 DIAGNOSIS — E1122 Type 2 diabetes mellitus with diabetic chronic kidney disease: Secondary | ICD-10-CM | POA: Diagnosis not present

## 2018-03-23 DIAGNOSIS — E1165 Type 2 diabetes mellitus with hyperglycemia: Secondary | ICD-10-CM | POA: Diagnosis not present

## 2018-03-23 DIAGNOSIS — K219 Gastro-esophageal reflux disease without esophagitis: Secondary | ICD-10-CM | POA: Diagnosis not present

## 2018-03-23 DIAGNOSIS — I129 Hypertensive chronic kidney disease with stage 1 through stage 4 chronic kidney disease, or unspecified chronic kidney disease: Secondary | ICD-10-CM | POA: Diagnosis not present

## 2018-03-23 DIAGNOSIS — J441 Chronic obstructive pulmonary disease with (acute) exacerbation: Secondary | ICD-10-CM | POA: Diagnosis not present

## 2018-03-23 DIAGNOSIS — D469 Myelodysplastic syndrome, unspecified: Secondary | ICD-10-CM | POA: Diagnosis not present

## 2018-03-23 DIAGNOSIS — M6281 Muscle weakness (generalized): Secondary | ICD-10-CM | POA: Diagnosis not present

## 2018-03-23 DIAGNOSIS — J929 Pleural plaque without asbestos: Secondary | ICD-10-CM | POA: Diagnosis not present

## 2018-03-23 DIAGNOSIS — G4733 Obstructive sleep apnea (adult) (pediatric): Secondary | ICD-10-CM | POA: Diagnosis not present

## 2018-03-23 DIAGNOSIS — R918 Other nonspecific abnormal finding of lung field: Secondary | ICD-10-CM | POA: Diagnosis not present

## 2018-03-23 DIAGNOSIS — I480 Paroxysmal atrial fibrillation: Secondary | ICD-10-CM | POA: Diagnosis not present

## 2018-03-23 DIAGNOSIS — C9202 Acute myeloblastic leukemia, in relapse: Secondary | ICD-10-CM | POA: Diagnosis not present

## 2018-03-23 DIAGNOSIS — J9691 Respiratory failure, unspecified with hypoxia: Secondary | ICD-10-CM | POA: Diagnosis not present

## 2018-03-23 LAB — PLATELET COUNT: Lab: 51 — ABNORMAL LOW

## 2018-03-23 LAB — LACTATE DEHYDROGENASE: Lactate dehydrogenase:CCnc:Pt:Ser/Plas:Qn:: 2110 — ABNORMAL HIGH

## 2018-03-23 LAB — COMPREHENSIVE METABOLIC PANEL
ALBUMIN: 2.7 g/dL — ABNORMAL LOW (ref 3.5–5.0)
ALT (SGPT): 20 U/L (ref 15–48)
ANION GAP: 7 mmol/L — ABNORMAL LOW (ref 9–15)
AST (SGOT): 24 U/L (ref 14–38)
BILIRUBIN TOTAL: 0.9 mg/dL (ref 0.0–1.2)
BLOOD UREA NITROGEN: 72 mg/dL — ABNORMAL HIGH (ref 7–21)
BUN / CREAT RATIO: 40
CALCIUM: 7.5 mg/dL — ABNORMAL LOW (ref 8.5–10.2)
CHLORIDE: 106 mmol/L (ref 98–107)
CO2: 23 mmol/L (ref 22.0–30.0)
CREATININE: 1.78 mg/dL — ABNORMAL HIGH (ref 0.60–1.00)
EGFR CKD-EPI NON-AA FEMALE: 27 mL/min/{1.73_m2} — ABNORMAL LOW (ref >=60)
GLUCOSE RANDOM: 141 mg/dL (ref 65–179)
POTASSIUM: 3.8 mmol/L (ref 3.5–5.0)
PROTEIN TOTAL: 5.2 g/dL — ABNORMAL LOW (ref 6.5–8.3)
SODIUM: 136 mmol/L (ref 135–145)

## 2018-03-23 LAB — CBC W/ AUTO DIFF
HEMATOCRIT: 23.5 % — ABNORMAL LOW (ref 36.0–46.0)
MEAN CORPUSCULAR HEMOGLOBIN CONC: 31.9 g/dL (ref 31.0–37.0)
MEAN CORPUSCULAR HEMOGLOBIN: 30.5 pg (ref 26.0–34.0)
MEAN CORPUSCULAR VOLUME: 95.9 fL (ref 80.0–100.0)
MEAN PLATELET VOLUME: 8.4 fL (ref 7.0–10.0)
NUCLEATED RED BLOOD CELLS: 2 /100{WBCs} (ref <=4)
PLATELET COUNT: 13 10*9/L — ABNORMAL LOW (ref 150–440)
RED BLOOD CELL COUNT: 2.46 10*12/L — ABNORMAL LOW (ref 4.00–5.20)
RED CELL DISTRIBUTION WIDTH: 21.5 % — ABNORMAL HIGH (ref 12.0–15.0)

## 2018-03-23 LAB — PHOSPHORUS: Phosphate:MCnc:Pt:Ser/Plas:Qn:: 4.8 — ABNORMAL HIGH

## 2018-03-23 LAB — MANUAL DIFFERENTIAL
BASOPHILS - ABS (DIFF): 0 10*9/L (ref 0.0–0.1)
BLASTS - REL (DIFF): 25 % (ref <=0)
EOSINOPHILS - ABS (DIFF): 0 10*9/L (ref 0.0–0.4)
EOSINOPHILS - REL (DIFF): 0 %
LYMPHOCYTES - REL (DIFF): 13 %
MONOCYTES - ABS (DIFF): 5.6 10*9/L — ABNORMAL HIGH (ref 0.2–0.8)
MONOCYTES - REL (DIFF): 33 %
NEUTROPHILS - ABS (DIFF): 4.9 10*9/L (ref 2.0–7.5)
NEUTROPHILS - REL (DIFF): 29 %

## 2018-03-23 LAB — D-DIMER QUANTITATIVE (CH,ML,PD,ET): Lab: 5225 — ABNORMAL HIGH

## 2018-03-23 LAB — FIBRINOGEN LEVEL: Lab: 340

## 2018-03-23 LAB — MEAN CORPUSCULAR VOLUME: Lab: 95.9

## 2018-03-23 LAB — URIC ACID: Urate:MCnc:Pt:Ser/Plas:Qn:: 5.4

## 2018-03-23 LAB — MAGNESIUM: Magnesium:MCnc:Pt:Ser/Plas:Qn:: 1.5 — ABNORMAL LOW

## 2018-03-23 LAB — LYMPHOCYTES - REL (DIFF): Lab: 13

## 2018-03-23 LAB — ALBUMIN: Albumin:MCnc:Pt:Ser/Plas:Qn:: 2.7 — ABNORMAL LOW

## 2018-03-23 MED ORDER — HYDROXYUREA 500 MG CAPSULE
ORAL_CAPSULE | Freq: Two times a day (BID) | ORAL | 2 refills | 0.00000 days
Start: 2018-03-23 — End: 2018-06-21

## 2018-03-23 MED ORDER — SODIUM CHLORIDE 0.9 % IV SOLN
INTRAVENOUS | Status: DC
Start: ? — End: 2018-03-23

## 2018-03-23 MED ORDER — GENERIC EXTERNAL MEDICATION
Status: DC
Start: ? — End: 2018-03-23

## 2018-03-23 NOTE — Unmapped (Signed)
Afebrile, VSS except DBP runs softer, the team made aware, no new orders given pt is asymptomatic. ACHS as ordered. Possible DC to SNF tomorrow. WCTM.

## 2018-03-23 NOTE — Unmapped (Signed)
Pt afebrile, VSS, no replacements needed overnight.  Pt finished antibiotics and should dc to SNF today. Pt up ad lib with stand by assist. [facility].

## 2018-03-23 NOTE — Unmapped (Signed)
Physician Discharge Summary [facility]  4 ONC [facility]  [address]  [place] [address]  Dept: [phone]  Loc: [phone]     Identifying Information:   Jessica Wilson  29-Jan-1941  [id number]    Primary Care Physician: Kerman Passey, MD     Referring Physician: Katha Hamming     Code Status: DNR and DNI    Admit Date: 03/15/2018    Discharge Date: 03/23/2018     Discharge To: Skilled nursing facility    Discharge Service: MDE - Hematology Teaching     Discharge Attending Physician: Halford Decamp, MD    Discharge Diagnoses:  Principal Problem:    Streptococcal pneumonia (CMS-HCC)  Active Problems:    Acute myeloid leukemia not having achieved remission (CMS-HCC)    Pulmonary infiltrates    AKI (acute kidney injury) (CMS-HCC)    MDS (myelodysplastic syndrome), high grade (CMS-HCC)    Anemia    COPD, mild (CMS-HCC)    Diabetes mellitus, type 2 (CMS-HCC)    Dyslipidemia    Essential (primary) hypertension    Paroxysmal atrial fibrillation (CMS-HCC)    OSA on CPAP  Resolved Problems:    * No resolved hospital problems. *      Outpatient Provider Follow Up Issues:   Supportive Care Recommendations:  We recommend based on the patient???s underlying diagnosis and treatment history the following supportive care:    1. Antimicrobial prophylaxis:  None    2. Blood product support:  Leukoreduced blood products are required.  Irradiated blood products are preferred, but in case of urgent transfusion needs non-irradiated blood products may be used:     -  RBC transfusion threshold: transfuse 1 units for Hgb < 7 g/dL.  -  Platelet transfusion threshold: transfuse 1 unit of platelets for platelet count < 10, or for bleeding or need for invasive procedure.    Based on the patient's disease status and intensity of therapy, complete blood count with differential should be evaluated 1 times per week and used to guide transfusion support    3. Hematopoietic growth factor support: none    Hospital Course: Jessica Wilson is a 77 y.o. F??with a medical history of AML transformed from previous MDS, HTN, A fib, COPD, DM2, and obesity (BMI: 40.8) transferred 03/15/2018 from [place] for with pulmonary infiltrates and treated for pneumonia. Hospital course by problem below:    Pneumonia: Patient was incidentally found to have bilateral BLL infiltrates on abdominal imaging on 6/30, and was asymptomatic at the time. She subsequently developed cough and SOB which has not had substantial improvement with treatment with broad spectrum antibiotics at OSH. [facility]. Her CXR at admission still showed remarkable bilateral infiltrates. She was empirically treated with cefepime, vancomycin, and azithromycin. Lower respiratory culture grew Strep. Treatment was narrowed to cefepime. On 7/11 she was transitioned to levofloxacin and completed antibiotic therapy on 7/13. Interval CXR showed improvement of airways.    AML transformed from MDS: Primary Oncologists: [facility]), Louretta Shorten [facility]). As of 03/15/2018, she is on enasidenib. Diagnosed from 05/28/17 bone marrow biopsy (30% blasts), FLT3 internal tandem duplication positive, FLT3 tyrosine kinase domain negative. DNMT3A splice site variant and IDH2 missense mutation reported on 06/11/17 myeloid mutation panel. The patient had previously been on azacitadine x 9 cycles (C1D1: 04/29/16; C9D1: 01/13/17) for myelodysplastic syndrome that had been diagnosed from 05/30/16 bone marrow. At admission WBC counts was 8.2, enasidenib was held.  WBC count raised to 25 on 7/9 and ensaidenib was resumed the day later. The patient was also given hydroxyurea 500 mg BID and increased to 1000mg  BID due to persistently elevated leukocytosis. Restarted on home enasidenib at time of discharge. Only had 16 days remaining of home supply on 7/15, day of discharge.    AKI: Baseline Cr around 1.0. Had several episodes of AKI in the past. She was diuresed after receiving multiple liters IVF at presentation and had evidence of hypervolemia on exam. Exam improved but had worsening Cr a few days prior to discharge likely in the setting of over diuresis. BNP dropped from >4,000 to 600. Cr was 1.7 at discharge but was making good UOP. She should have a BMP on 7/17 to reassess renal function.     Procedures:  None  No admission procedures for hospital encounter.  ______________________________________________________________________  Discharge Medications:     Your Medication List      START taking these medications    hydroxyurea 500 mg capsule  Commonly known as:  HYDREA  Take 2 capsules (1,000 mg total) by mouth Two (2) times a day.        CONTINUE taking these medications    acetaminophen 325 MG tablet  Commonly known as:  TYLENOL  Take 650 mg by mouth every six (6) hours as needed for pain.     acyclovir 400 MG tablet  Commonly known as:  ZOVIRAX  Take 400 mg by mouth Two (2) times a day.     ANORO ELLIPTA 62.5-25 mcg/actuation inhaler  Generic drug:  umeclidinium-vilanterol  Inhale 1 puff daily.     buPROPion 150 MG 12 hr tablet  Commonly known as:  WELLBUTRIN SR  Take 150 mg by mouth Two (2) times a day.     chlorhexidine 0.12 % solution  Commonly known as:  PERIDEX  15 mL by Mouth route Two (2) times a day.     clonazePAM 0.5 MG tablet  Commonly known as:  KlonoPIN  Take 0.5 mg by mouth two (2) times a day as needed for anxiety.     diltiazem 180 MG 24 hr capsule  Commonly known as:  CARDIZEM CD  Take 180 mg by mouth daily.     enasidenib 100 mg tablet  Commonly known as:  IDHIFA  Take 1 tablet (100 mg total) by mouth daily.     sertraline 100 MG tablet  Commonly known as:  ZOLOFT  Take 100 mg by mouth daily.            Allergies:  Macrobid [nitrofurantoin monohyd/m-cryst]  ______________________________________________________________________  Pending Test Results (if blank, then none):   Order Current Status    Respiratory Pathogen Panel Collected (03/15/18 2349) Most Recent Labs:  CBC - Results in Past 2 Days  Result Component Current Result   WBC 17.0 (H) (03/23/2018)   RBC 2.46 (L) (03/23/2018)   HGB 7.5 (L) (03/23/2018)   HCT 23.5 (L) (03/23/2018)   MCV 95.9 (03/23/2018)   MCH 30.5 (03/23/2018)   MCHC 31.9 (03/23/2018)   MPV 8.4 (03/23/2018)   Platelet 13 (L) (03/23/2018)     BMP - Results in Past 2 Days  Result Component Current Result   Sodium 136 (03/23/2018)   Potassium 3.8 (03/23/2018)   Chloride 106 (03/23/2018)   CO2 23.0 (03/23/2018)   BUN 72 (H) (03/23/2018)   Creatinine 1.78 (H) (03/23/2018)   EST.GFR (MDRD) Not in Time Range   Glucose 141 (03/23/2018)  Relevant Studies/Radiology (if blank, then none):  No results found.  ______________________________________________________________________  Discharge Instructions:   Please make sure you have a functioning thermometer at home.  If you are feeling poorly, especially if you have chills, shaking, muscle aches or lightheadedness, measure your temperature. If it is more than 100.5 Farenheit, call the nurse triage line during daytime hours (Monday through Friday 8AM???5PM: [phone]) or on nights and weekends, the on-call doctor by calling the hospital operator [phone]) and asking for the on-call adult oncologist. Alternatively, since fever after chemotherapy may be a medical emergency, you may proceed directly to your local emergency room. Inform your provider that you recently received chemotherapy. You may have blood drawn for blood cultures and receive IV antibiotics.    Following discharge from the hospital if you notice the development or worsening of any symptoms such as nausea, vomiting, chest pain, shortness of breath, fevers, or chills, please return to the emergency department.      If you develop these symptoms, or if you have trouble obtaining any of your medications you may call the [facility] Communication Center to speak with the triage team at [phone] if Monday through Friday 8am-5pm or call [phone] after hours.      Please follow up kidney function with a BMP during your appointment on 7/15.    For appointments & questions Monday through Friday 8 AM??? 5 PM   please call [phone] or Toll free [phone].    On Nights, Weekends and Holidays  Call [phone] and ask for the oncologist on call.    [facility]  [address]  [place], [place] [address]  [email or web address]                   Follow Up instructions and Outpatient Referrals     Discharge instructions      I certify that based on my evaluation of this patient, this patient requires BLS transportation services and that other forms of transport are contraindicated.  Please refer to care management transitions note for transportation details.         Discharge instructions      Thank you for coming to [facility]! You came here with pneumonia. We treated you with antibiotics and your symptoms improved. During your stay you required diuretics to remove extra fluids which resulted in your kidney function markers (Creatinine) to become elevated. Please follow up laboratory studies with your provider to assess your kidney function.    You have and appointment with Mariana Kaufman on 7/17 with a lab check at 930 am. They should check your platelets, white blood cell count, and kidney function.    You then have an appointment with Dr. Malen Gauze on Monday, 7/22 at 8:00 am. You should discuss at that time if you should remain on the enasidenib.     Please make sure you have a functioning thermometer at home.?? If you are feeling poorly, especially if you have chills, shaking, muscle aches or lightheadedness, measure your temperature. If it is more than 100.5 Farenheit, call the nurse triage line during daytime hours (Monday through Friday 8AM-5PM: [phone]) or on nights and weekends, the on-call doctor by calling the hospital operator [phone]) and asking for the on-call adult oncologist. Alternatively, since fever after chemotherapy may be a medical emergency, you may proceed directly to your local emergency room. Inform your provider that you recently received chemotherapy. You may have blood drawn for blood cultures and receive IV antibiotics.  Following discharge from the hospital if you notice the development or worsening of any symptoms such as nausea, vomiting, chest pain, shortness of breath, fevers, or chills, please return to the emergency department.??     If you develop these symptoms, or if you have trouble obtaining any of your medications you may call the [facility] Communication Center to speak with the triage team at [phone] if Monday through Friday 8am-5pm or call [phone] after hours.      For appointments & questions Monday through Friday 8 AM- 5 PM   please call [phone] or Toll free [phone].    On Nights, Weekends and Holidays  Call [phone] and ask for the oncologist on call.    [facility]  [address]  [place], [place] [address]  [email or web address]         Basic Metabolic Panel      Is this a fasting order?:  No     BMP contains the following laboratory tests: NA, K, CL, CO2, BUN, CR, GLUC, and CA.     CBC w/ Differential            Appointments which have been scheduled for you    Mar 25, 2018  9:30 AM EDT  (Arrive by 9:00 AM)  LAB ONLY [place] with ADULT ONC LAB  [facility] ADULT ONCOLOGY LAB DRAW STATION [facility] REGION) [address]  [place] [address]  [phone]      Mar 25, 2018 10:30 AM EDT  (Arrive by 10:00 AM)  RETURN ACTIVE [place] with Thyra Breed, NP  [facility] HEMATOLOGY ONCOLOGY 2ND FLR CANCER HOSP [facility] REGION) [address]  [place] [address]  [phone]      Mar 30, 2018  8:00 AM EDT  (Arrive by 7:30 AM)  LAB ONLY [place] with ADULT ONC LAB  [facility] ADULT ONCOLOGY LAB DRAW STATION [facility] REGION) [address]  [place] [address] [phone]      Mar 30, 2018  9:00 AM EDT  (Arrive by 8:30 AM)  RETURN ACTIVE [place] with Guerry Bruin, MD  [facility] HEMATOLOGY ONCOLOGY 2ND FLR CANCER HOSP [facility] REGION) [address]  [place] [address]  [phone]           ______________________________________________________________________  Discharge Day Services:  BP 120/40  - Pulse 81  - Temp 37.4 ??C (Oral)  - Resp 22  - Ht 160 cm (5' 2.99)  - Wt (!) 100.8 kg (222 lb 3.2 oz)  - SpO2 95%  - BMI 39.37 kg/m??   Pt seen on the day of discharge and determined appropriate for discharge.    Condition at Discharge: fair    Length of Discharge: I spent less than 30 mins in the discharge of this patient.

## 2018-03-23 NOTE — Unmapped (Signed)
Pt alert and oriented x4. Pt has been afebrile with stable VS, bp continues to be low, MD aware. Pt received on unit of platelets per order. Pt with no c/o pain or nausea. Pt with active bowel and good urine output. No new skin breakdown this shift. No s/s infection this shift. Fall precautions and pt safely maintained. Discharge instructions, medications, and follow up appointment reviewed with pt/family, with stated understanding. Port de-accessed and heparin locked per protocol. Pt discharged to facility with all belongings, reported called to facility.     Problem: Adult Inpatient Plan of Care  Goal: Plan of Care Review  Outcome: Transitioned to Another Facility  Goal: Patient-Specific Goal (Individualization)  Outcome: Transitioned to Another Facility  Goal: Absence of Hospital-Acquired Illness or Injury  Outcome: Transitioned to Another Facility  Goal: Optimal Comfort and Wellbeing  Outcome: Transitioned to Another Facility  Goal: Readiness for Transition of Care  Outcome: Transitioned to Another Facility  Goal: Rounds/Family Conference  Outcome: Transitioned to Another Facility

## 2018-03-23 NOTE — Telephone Encounter (Signed)
Left voicemail for patient to call back to be schedule

## 2018-03-23 NOTE — Telephone Encounter (Signed)
Notified patient's husband of follow up appointment scheduled with Dr Junious Silk on 04/16/2018 & need for renal ultrasound prior to the appointment. Explained that radiology scheduling will call to schedule the ultrasound. Husband voices understanding & states patient is currently admitted to [facility] but will be transferred to [facility] soon.

## 2018-03-23 NOTE — Telephone Encounter (Signed)
Called and left voice mail for patient to call back to be schedule °

## 2018-03-24 MED ORDER — GENERIC EXTERNAL MEDICATION
Status: DC
Start: ? — End: 2018-03-24

## 2018-03-24 NOTE — Unmapped (Signed)
Hi,     Shirley,sister of the patient, contacted the Communication Center regarding the following:    Patient not up to the appointment set for 03/25/18.    Please contact Talbert Forest at [phone].    Thanks in advance,    Drema Balzarine  [facility] Cancer Communication Center   [phone]

## 2018-03-24 NOTE — Unmapped (Signed)
7/16: Phoned [facility] [phone] to confirm transportation to [facility] for lab visit 7/17. Spoke to [employer], she stated that it will be no problem on their end, however she spoke with patient earlier today and pt says she feels too weak to go for any appts this week. Explained to Olegario Messier the nature of the tests we are requesting, incl additional tubes of blood to be processed at [facility] to gather more details about Pts leukemia. Olegario Messier will speak with patient again and is hopeful she will be amendable to going to local lab visit tomorrow. For future lab checks without special processing, they are able to draw CBC w diff/CMP at the rehab center.     Following phoned [facility], spoke to [place], [phone], they are able to collect additional samples and ship out via [employer], and request specific instructions be faxed to [phone]. Regarding appt time tomorrow for collection, per Dionte pt can arrive any time to outpt lab in medical mall. Lab orders and collection/shipping instructions for MMP faxed to [facility], confirmation received.    Lastly, phoned patient's sister Talbert Forest per her request to inform her the above has been completed for 7/17. Let her know per Elly Modena at [facility], pt not keen on going for lab visit this week. Per Talbert Forest, patient has had some confusion since hospitalization, and she will be visiting her today and will speak with her more. Informed Elly Modena of walk in appt at [facility] tomorrow (early afternoon latest  requested by NN to allow time for shipment of samples), she spoke with pt, and pt is agreeable to having labs tomorrow at [facility].    7/18: Phoned [facility] to follow up on MMP from faxed orders 7/16, [facility] has not yet received. [place] will look into it and call us back. Per [facility] lab at [facility], they did not have the orders on hand and therefore were unable to collect the tubes for the mutation panel. They state they phone [facility] to get more info and were unable to reach anyone there, and did not have fax with direct contact info for [facility]. [facility] informed sample will not be arriving today.

## 2018-03-24 NOTE — Unmapped (Signed)
AOC Triage Note     Patient: Jessica Wilson     Reason for call:    Time call returned: 8:23     Phone Assessment: Talbert Forest patients sister called, she states that patient is at [facility] (rehab facility) Benj[facility] Spr[facility]. Talbert Forest is asking if ok to cancel her appointment with Jewel Baize NP on 03/25/18 & see Dr. Malen Gauze on 03/30/18.     Triage Recommendations: Stated that patient may need to have blood work prior to 03/30/18 appointment since patients platelet count has been low recently.  Spoke with Jewel Baize NP, patient needs to have a CBC with diff and CMP tomorrow ok to cancel appointment with Jewel Baize NP , patient must keep appointment with provider on 03/30/18.    Patient Response: Spoke with Talbert Forest regarding the plan for blood work only on 03/25/18 @ [facility] and to keep appointment for 03/30/18 to see provider.     Outstanding tasks: Notification     Patient Pharmacy has been verified and primary pharmacy has been marked as preferred

## 2018-03-24 NOTE — Unmapped (Signed)
Hi,     Elly Modena with [facility] contacted the Communication Center regarding the following:    - Patient Jessica Wilson is reluctant to come to her appointment tomorrow. [facility] is offering to draw patient's labs provided that patient does not need follow-up tomorrow.    Please contact Ms. Rogers at [phone].    Thanks in advance,    Kelli Hope  [facility] Cancer Communication Center   [phone]

## 2018-03-24 NOTE — Unmapped (Signed)
AOC Triage Note     Patient: Jessica Wilson     Reason for call: Return call to [facility]    Time call returned: 10:02     Phone Assessment: Spoke with Elly Modena from [facility], she states that she spoke with Ander Slade RN from [facility] today and is in the process of setting up labs at [facility] for 03/25/18.     Triage Recommendations: Instructed Elly Modena to call if she does not hear back from [facility] with a confirmation about labs for tomorrow.     Patient Response: N/A   Outstanding tasks: N/A

## 2018-03-25 ENCOUNTER — Other Ambulatory Visit: Payer: Self-pay

## 2018-03-25 ENCOUNTER — Inpatient Hospital Stay: Payer: Medicare Other | Attending: Internal Medicine

## 2018-03-25 ENCOUNTER — Ambulatory Visit: Payer: Medicare Other | Admitting: Family Medicine

## 2018-03-25 ENCOUNTER — Other Ambulatory Visit: Payer: Self-pay | Admitting: *Deleted

## 2018-03-25 DIAGNOSIS — C9200 Acute myeloblastic leukemia, not having achieved remission: Secondary | ICD-10-CM | POA: Diagnosis not present

## 2018-03-25 DIAGNOSIS — I10 Essential (primary) hypertension: Secondary | ICD-10-CM | POA: Diagnosis not present

## 2018-03-25 DIAGNOSIS — D696 Thrombocytopenia, unspecified: Secondary | ICD-10-CM

## 2018-03-25 DIAGNOSIS — N189 Chronic kidney disease, unspecified: Secondary | ICD-10-CM | POA: Diagnosis not present

## 2018-03-25 DIAGNOSIS — B953 Streptococcus pneumoniae as the cause of diseases classified elsewhere: Secondary | ICD-10-CM | POA: Diagnosis not present

## 2018-03-25 DIAGNOSIS — C92A0 Acute myeloid leukemia with multilineage dysplasia, not having achieved remission: Secondary | ICD-10-CM | POA: Diagnosis not present

## 2018-03-25 LAB — CBC WITH DIFFERENTIAL/PLATELET
Band Neutrophils: 2 %
Basophils Absolute: 0 10*3/uL (ref 0–0.1)
Basophils Relative: 0 %
Blasts: 7 %
Eosinophils Absolute: 0.1 10*3/uL (ref 0–0.7)
Eosinophils Relative: 1 %
HCT: 23.2 % — ABNORMAL LOW (ref 35.0–47.0)
Hemoglobin: 7.6 g/dL — ABNORMAL LOW (ref 12.0–16.0)
Lymphocytes Relative: 17 %
Lymphs Abs: 2.4 10*3/uL (ref 1.0–3.6)
MCH: 30.5 pg (ref 26.0–34.0)
MCHC: 32.5 g/dL (ref 32.0–36.0)
MCV: 93.8 fL (ref 80.0–100.0)
Metamyelocytes Relative: 2 %
Monocytes Absolute: 8 10*3/uL — ABNORMAL HIGH (ref 0.2–0.9)
Monocytes Relative: 57 %
Myelocytes: 3 %
Neutro Abs: 2.5 10*3/uL (ref 1.4–6.5)
Neutrophils Relative %: 11 %
Other: 0 %
Platelets: 24 10*3/uL — CL (ref 150–400)
Promyelocytes Relative: 0 %
RBC: 2.48 MIL/uL — ABNORMAL LOW (ref 3.80–5.20)
RDW: 22.8 % — ABNORMAL HIGH (ref 11.5–14.5)
WBC: 14 10*3/uL — ABNORMAL HIGH (ref 3.6–11.0)
nRBC: 2 /100 WBC — ABNORMAL HIGH

## 2018-03-25 LAB — BASIC METABOLIC PANEL
Anion gap: 9 (ref 5–15)
BUN: 71 mg/dL — ABNORMAL HIGH (ref 8–23)
CO2: 21 mmol/L — ABNORMAL LOW (ref 22–32)
Calcium: 8.5 mg/dL — ABNORMAL LOW (ref 8.9–10.3)
Chloride: 105 mmol/L (ref 98–111)
Creatinine, Ser: 1.83 mg/dL — ABNORMAL HIGH (ref 0.44–1.00)
GFR calc Af Amer: 30 mL/min — ABNORMAL LOW (ref >60)
GFR calc non Af Amer: 26 mL/min — ABNORMAL LOW (ref >60)
Glucose, Bld: 160 mg/dL — ABNORMAL HIGH (ref 70–99)
Potassium: 4.3 mmol/L (ref 3.5–5.1)
Sodium: 135 mmol/L (ref 135–145)

## 2018-03-25 LAB — SAMPLE TO BLOOD BANK

## 2018-03-25 MED ORDER — GENERIC EXTERNAL MEDICATION
Status: DC
Start: ? — End: 2018-03-25

## 2018-03-25 NOTE — Unmapped (Signed)
Hi,     The patient's sister contacted the Communication Center regarding the following:    - She is calling to get the Lab orders sent over to [facility].    Please contact at [phone].    Thanks in advance,    Vernie Ammons  [facility] Cancer Communication Center   [phone]

## 2018-03-25 NOTE — Unmapped (Signed)
Hi,     Nurse [place] with [facility] contacted the Communication Center regarding the following:    - Patient in clinic for lab work. Orders are not in.    Please contact Nurse [place]ada at [phone].    Thanks in advance,    Kelli Hope  [facility] Cancer Communication Center   [phone]

## 2018-03-26 NOTE — Unmapped (Addendum)
Update 03/30/18: Infusion pharmacist called to ask [place] to hold Idhifa delivery per patient request. They may be d/cing. Will alert [facility] Pharmacist responsible and will move call out ~ 1 week to check in.     [facility] Refill and Clinical Coordination Note  Medication(s): Idhifa 100mg     Jessica Wilson, DOB: Aug 03, 1941  Phone: [phone] (home) [phone] (work), Alternate phone contact: N/A  Shipping address: [address]  [place] [address]  Phone or address changes today?: No  All above HIPAA information verified.  Insurance changes? No    Completed refill and clinical call assessment today to schedule patient's medication shipment from the [facility] [phone]).      MEDICATION RECONCILIATION    Confirmed the medication and dosage are correct and have not changed: Yes, regimen is correct and unchanged.    Were there any changes to your medication(s) in the past month:  No, there are no changes reported at this time.    ADHERENCE    Is this medicine transplant or covered by Medicare Part B? No.    Did you miss any doses in the past 4 weeks? No missed doses reported.  Adherence counseling provided? Not needed     SIDE EFFECT MANAGEMENT    Are you tolerating your medication?:  Jessica Wilson reports tolerating the medication.  Side effect management discussed: None      Therapy is appropriate and should be continued.    Evidence of clinical benefit: See Epic note from 03/09/2018      FINANCIAL/SHIPPING    Delivery Scheduled: Yes, Expected medication delivery date: 04/03/2018   Additional medications refilled: No additional medications/refills needed at this time.    The patient will receive an FSI print out for each medication shipped and additional FDA Medication Guides as required.  Patient education from [place] or Robet Leu may also be included in the shipment.    Jessica Wilson did not have any additional questions at this time.    Delivery address validated in FSI scheduling system: Yes, address listed above is correct.      We will follow up with patient monthly for standard refill processing and delivery.      Thank you,  Cinthya Bors  Anders Grant   [facility] Specialty Pharmacist

## 2018-03-27 IMAGING — CR DG CHEST 2V
1 series · 2 of 2 positions shown · non-contrast
Comparison: PA and lateral chest 07/17/2017 and 07/29/2016.

CLINICAL DATA: Shortness of breath beginning this morning.

EXAM:
CHEST  2 VIEW

[Series 5: w chest pa · 0.14mm/px · 2 of 2 slices shown]
[im 1/2]
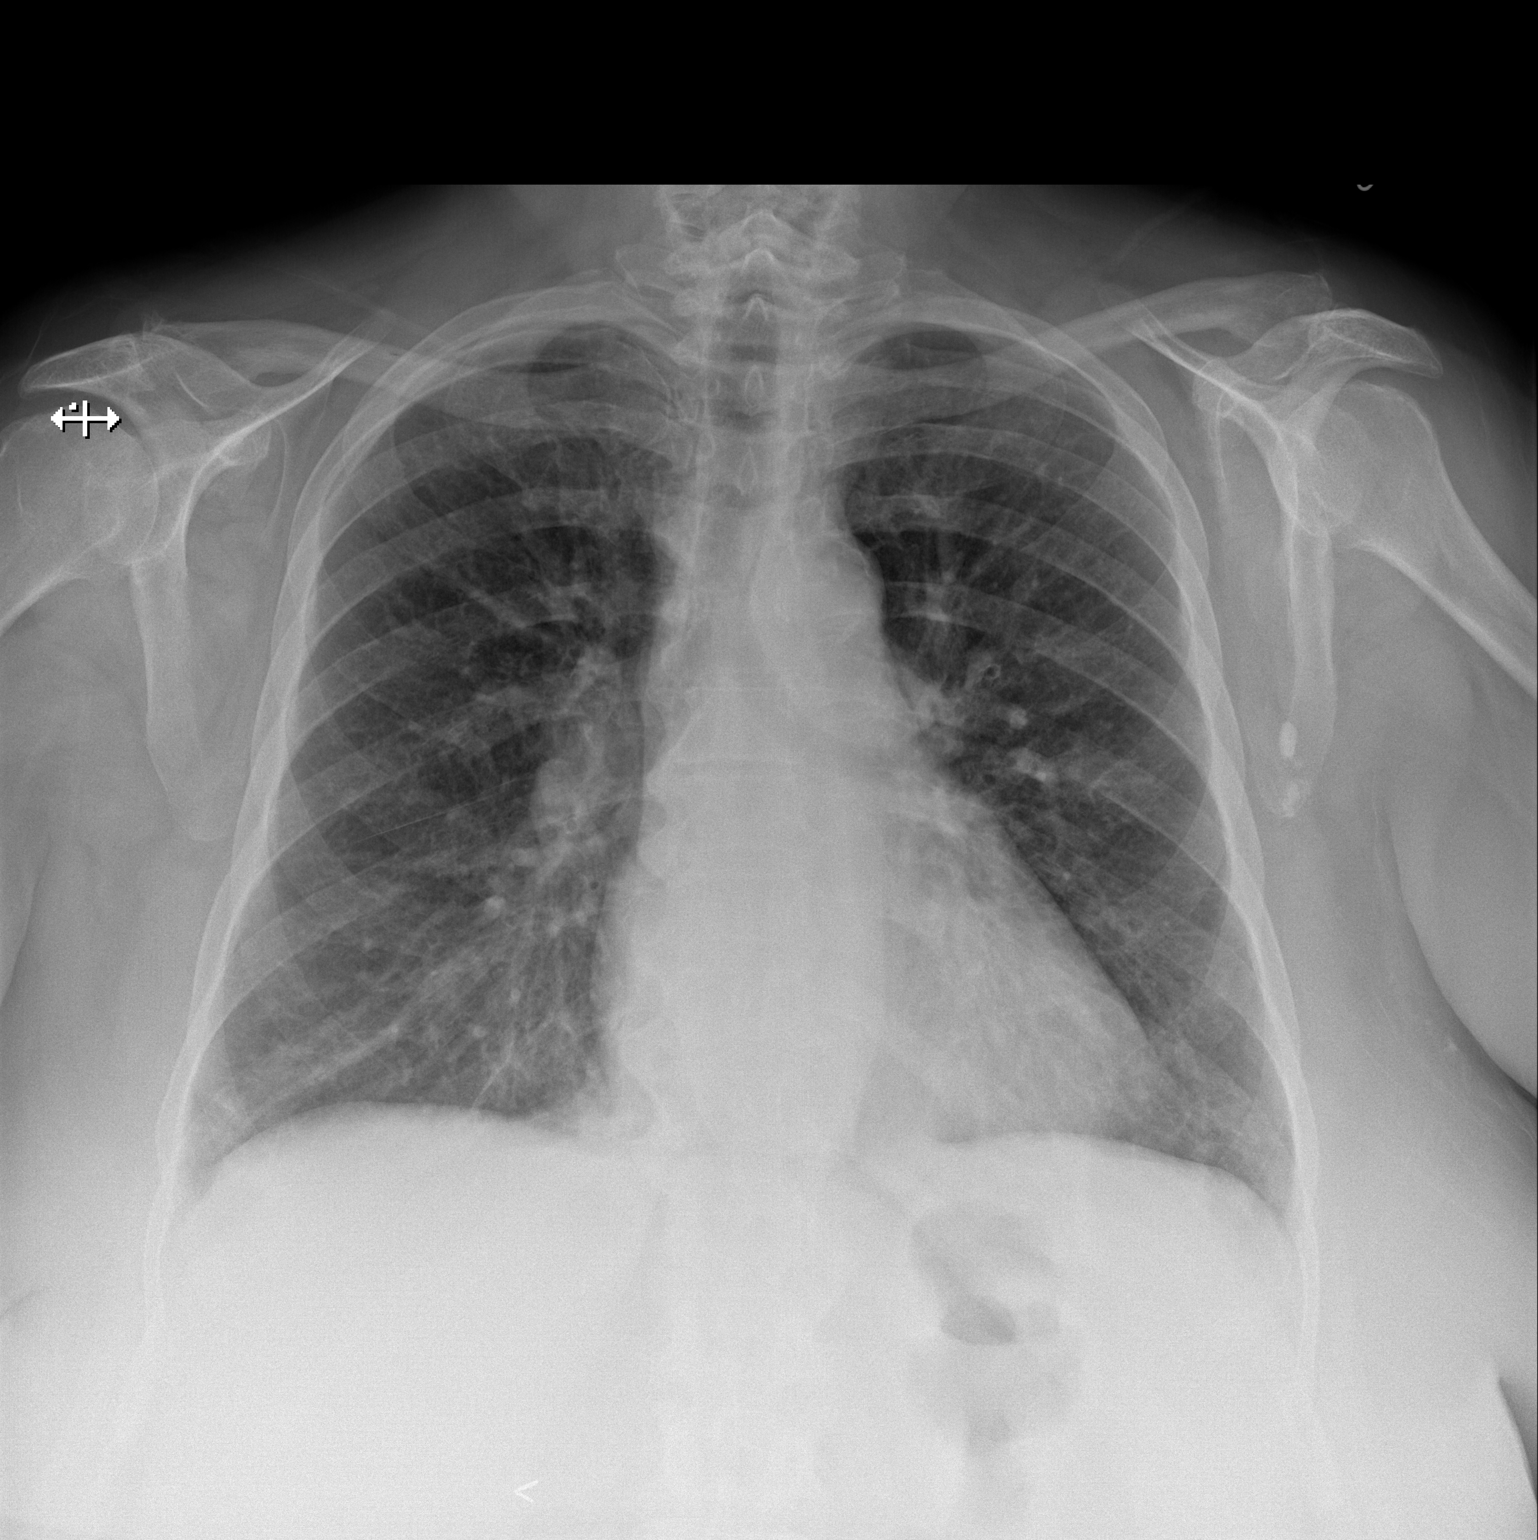
[im 2/2]
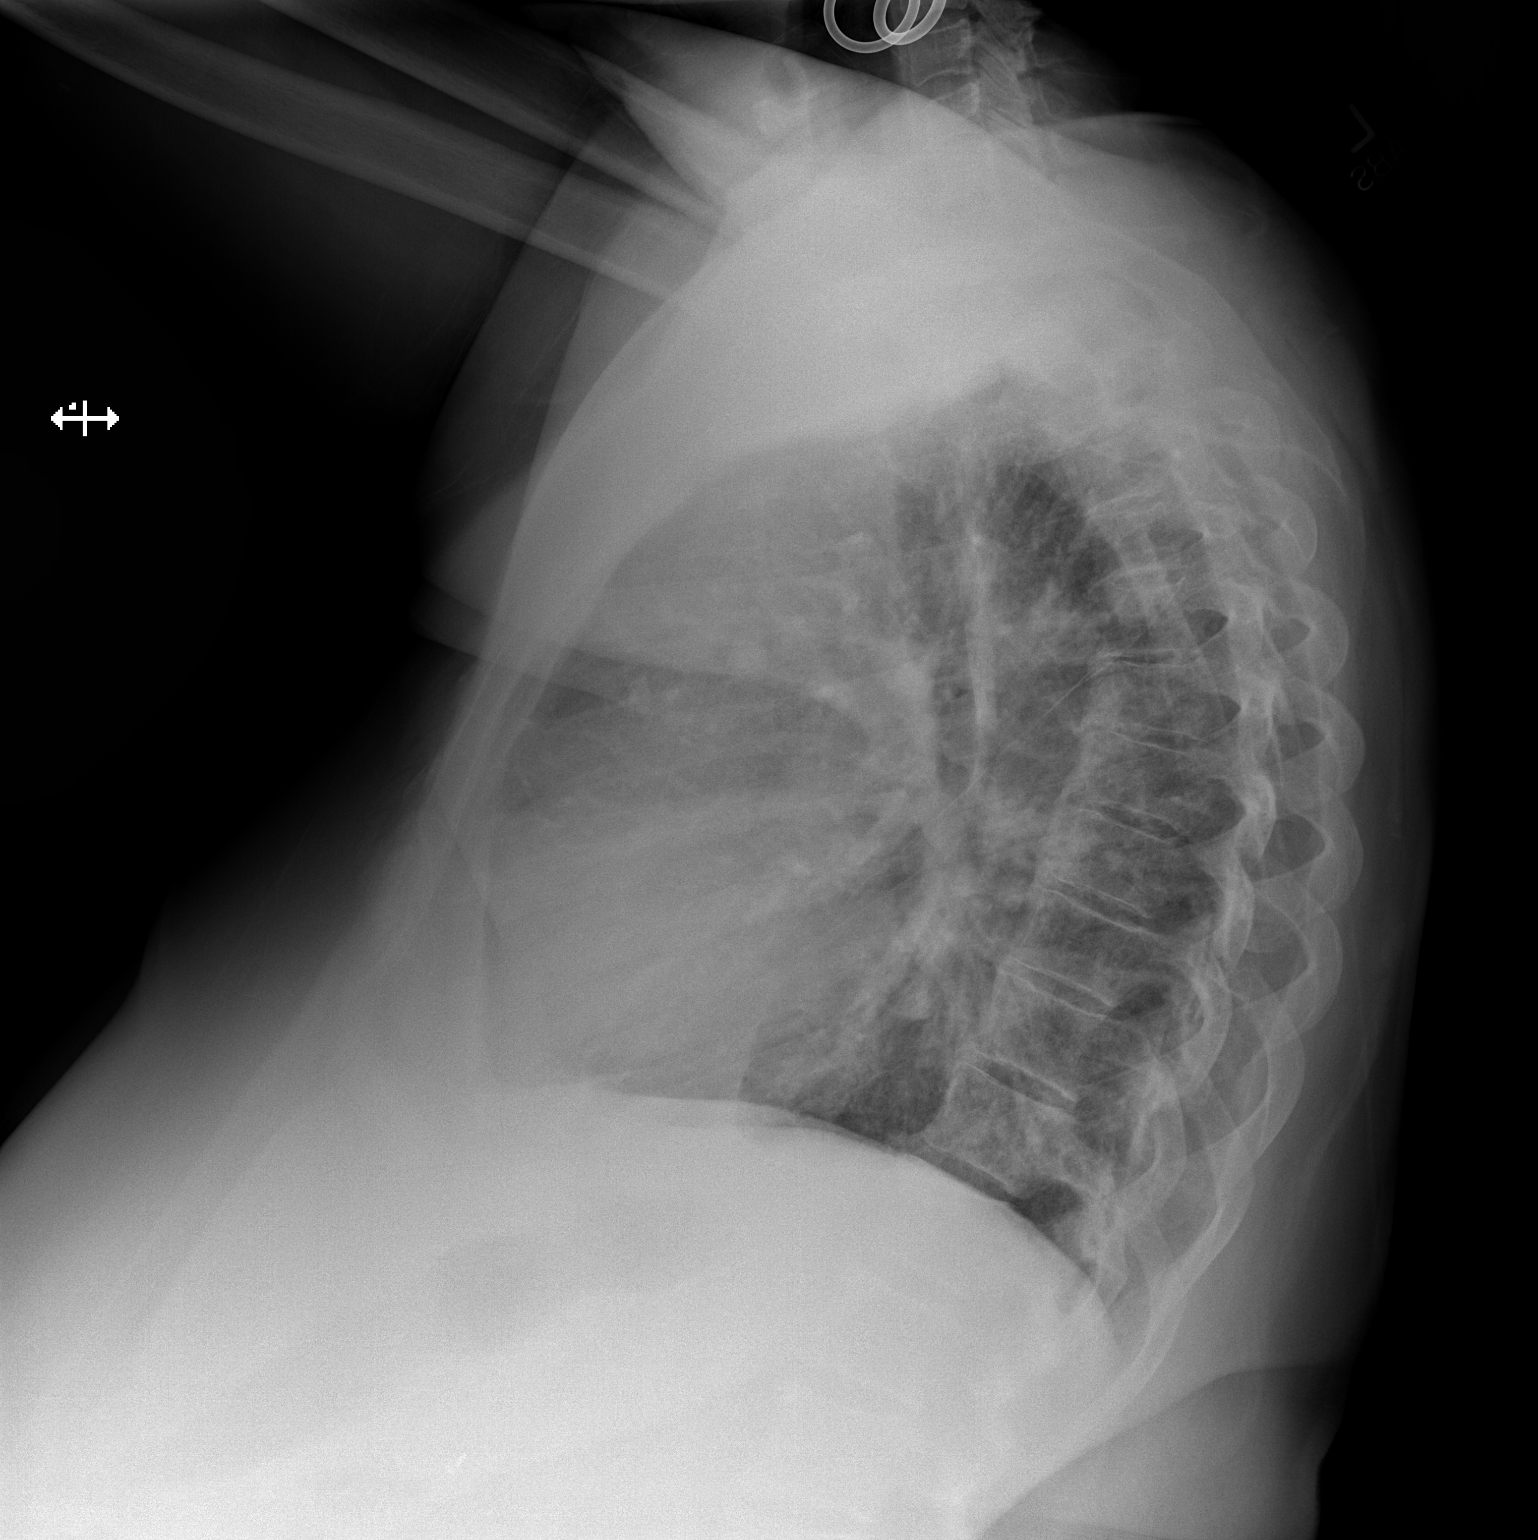

[2 of 2 positions shown; findings below may reference images not displayed]

FINDINGS: Heart size is upper normal. No consolidative process, pneumothorax
or effusion. Aortic atherosclerosis is noted. Ovoid calcifications
in the left axilla may be calcified lymph nodes, unchanged.
IMPRESSION: No acute disease.

Atherosclerosis.

## 2018-03-30 ENCOUNTER — Ambulatory Visit: Admit: 2018-03-30 | Discharge: 2018-05-10 | Disposition: E | Payer: MEDICARE | Admitting: Critical Care Medicine

## 2018-03-30 ENCOUNTER — Other Ambulatory Visit: Admit: 2018-03-30 | Discharge: 2018-05-10 | Disposition: E | Payer: MEDICARE | Admitting: Critical Care Medicine

## 2018-03-30 ENCOUNTER — Ambulatory Visit
Admit: 2018-03-30 | Discharge: 2018-05-10 | Disposition: E | Payer: MEDICARE | Attending: Internal Medicine | Admitting: Critical Care Medicine

## 2018-03-30 DIAGNOSIS — C9200 Acute myeloblastic leukemia, not having achieved remission: Principal | ICD-10-CM

## 2018-03-30 DIAGNOSIS — J9621 Acute and chronic respiratory failure with hypoxia: Principal | ICD-10-CM

## 2018-03-30 DIAGNOSIS — J189 Pneumonia, unspecified organism: Secondary | ICD-10-CM | POA: Diagnosis not present

## 2018-03-30 DIAGNOSIS — N189 Chronic kidney disease, unspecified: Secondary | ICD-10-CM | POA: Diagnosis not present

## 2018-03-30 DIAGNOSIS — J929 Pleural plaque without asbestos: Secondary | ICD-10-CM | POA: Diagnosis not present

## 2018-03-30 DIAGNOSIS — C9202 Acute myeloblastic leukemia, in relapse: Secondary | ICD-10-CM | POA: Diagnosis not present

## 2018-03-30 DIAGNOSIS — I959 Hypotension, unspecified: Secondary | ICD-10-CM | POA: Diagnosis not present

## 2018-03-30 DIAGNOSIS — G4733 Obstructive sleep apnea (adult) (pediatric): Secondary | ICD-10-CM | POA: Diagnosis not present

## 2018-03-30 DIAGNOSIS — Z515 Encounter for palliative care: Secondary | ICD-10-CM | POA: Diagnosis not present

## 2018-03-30 DIAGNOSIS — R0600 Dyspnea, unspecified: Secondary | ICD-10-CM | POA: Diagnosis not present

## 2018-03-30 DIAGNOSIS — E785 Hyperlipidemia, unspecified: Secondary | ICD-10-CM | POA: Diagnosis not present

## 2018-03-30 DIAGNOSIS — J910 Malignant pleural effusion: Secondary | ICD-10-CM | POA: Diagnosis not present

## 2018-03-30 DIAGNOSIS — R9431 Abnormal electrocardiogram [ECG] [EKG]: Secondary | ICD-10-CM | POA: Diagnosis not present

## 2018-03-30 DIAGNOSIS — I480 Paroxysmal atrial fibrillation: Secondary | ICD-10-CM | POA: Diagnosis not present

## 2018-03-30 DIAGNOSIS — J449 Chronic obstructive pulmonary disease, unspecified: Secondary | ICD-10-CM | POA: Diagnosis not present

## 2018-03-30 DIAGNOSIS — J9811 Atelectasis: Secondary | ICD-10-CM | POA: Diagnosis not present

## 2018-03-30 DIAGNOSIS — R918 Other nonspecific abnormal finding of lung field: Secondary | ICD-10-CM | POA: Diagnosis not present

## 2018-03-30 DIAGNOSIS — J90 Pleural effusion, not elsewhere classified: Secondary | ICD-10-CM | POA: Diagnosis not present

## 2018-03-30 DIAGNOSIS — I4891 Unspecified atrial fibrillation: Secondary | ICD-10-CM | POA: Diagnosis not present

## 2018-03-30 DIAGNOSIS — J441 Chronic obstructive pulmonary disease with (acute) exacerbation: Secondary | ICD-10-CM | POA: Diagnosis not present

## 2018-03-30 DIAGNOSIS — D696 Thrombocytopenia, unspecified: Secondary | ICD-10-CM | POA: Diagnosis not present

## 2018-03-30 DIAGNOSIS — D469 Myelodysplastic syndrome, unspecified: Secondary | ICD-10-CM | POA: Diagnosis not present

## 2018-03-30 DIAGNOSIS — Z4682 Encounter for fitting and adjustment of non-vascular catheter: Secondary | ICD-10-CM | POA: Diagnosis not present

## 2018-03-30 DIAGNOSIS — M7981 Nontraumatic hematoma of soft tissue: Secondary | ICD-10-CM | POA: Diagnosis not present

## 2018-03-30 DIAGNOSIS — J440 Chronic obstructive pulmonary disease with acute lower respiratory infection: Secondary | ICD-10-CM | POA: Diagnosis not present

## 2018-03-30 DIAGNOSIS — J869 Pyothorax without fistula: Secondary | ICD-10-CM | POA: Diagnosis not present

## 2018-03-30 DIAGNOSIS — R0902 Hypoxemia: Secondary | ICD-10-CM | POA: Diagnosis not present

## 2018-03-30 DIAGNOSIS — J810 Acute pulmonary edema: Secondary | ICD-10-CM | POA: Diagnosis not present

## 2018-03-30 DIAGNOSIS — E1122 Type 2 diabetes mellitus with diabetic chronic kidney disease: Secondary | ICD-10-CM | POA: Diagnosis not present

## 2018-03-30 DIAGNOSIS — K219 Gastro-esophageal reflux disease without esophagitis: Secondary | ICD-10-CM | POA: Diagnosis not present

## 2018-03-30 DIAGNOSIS — Z66 Do not resuscitate: Secondary | ICD-10-CM | POA: Diagnosis not present

## 2018-03-30 DIAGNOSIS — I129 Hypertensive chronic kidney disease with stage 1 through stage 4 chronic kidney disease, or unspecified chronic kidney disease: Secondary | ICD-10-CM | POA: Diagnosis not present

## 2018-03-30 DIAGNOSIS — J9691 Respiratory failure, unspecified with hypoxia: Secondary | ICD-10-CM | POA: Diagnosis not present

## 2018-03-30 DIAGNOSIS — M109 Gout, unspecified: Secondary | ICD-10-CM | POA: Diagnosis not present

## 2018-03-30 DIAGNOSIS — J811 Chronic pulmonary edema: Secondary | ICD-10-CM | POA: Diagnosis not present

## 2018-03-30 DIAGNOSIS — E1165 Type 2 diabetes mellitus with hyperglycemia: Secondary | ICD-10-CM | POA: Diagnosis not present

## 2018-03-30 DIAGNOSIS — R739 Hyperglycemia, unspecified: Secondary | ICD-10-CM | POA: Diagnosis not present

## 2018-03-30 DIAGNOSIS — N179 Acute kidney failure, unspecified: Secondary | ICD-10-CM | POA: Diagnosis not present

## 2018-03-30 DIAGNOSIS — R000 Tachycardia, unspecified: Secondary | ICD-10-CM | POA: Diagnosis not present

## 2018-03-30 LAB — CBC W/ AUTO DIFF
HEMATOCRIT: 22.4 % — ABNORMAL LOW (ref 36.0–46.0)
HEMOGLOBIN: 7.1 g/dL — ABNORMAL LOW (ref 12.0–16.0)
MEAN CORPUSCULAR HEMOGLOBIN CONC: 31.8 g/dL (ref 31.0–37.0)
MEAN CORPUSCULAR HEMOGLOBIN: 30.5 pg (ref 26.0–34.0)
MEAN CORPUSCULAR VOLUME: 95.9 fL (ref 80.0–100.0)
MEAN PLATELET VOLUME: 8.5 fL (ref 7.0–10.0)
RED CELL DISTRIBUTION WIDTH: 22.8 % — ABNORMAL HIGH (ref 12.0–15.0)
WBC ADJUSTED: 16.9 10*9/L — ABNORMAL HIGH (ref 4.5–11.0)

## 2018-03-30 LAB — COMPREHENSIVE METABOLIC PANEL
ALKALINE PHOSPHATASE: 63 U/L (ref 38–126)
ALT (SGPT): 16 U/L (ref 15–48)
ANION GAP: 7 mmol/L — ABNORMAL LOW (ref 9–15)
AST (SGOT): 16 U/L (ref 14–38)
BILIRUBIN TOTAL: 1.5 mg/dL — ABNORMAL HIGH (ref 0.0–1.2)
BLOOD UREA NITROGEN: 37 mg/dL — ABNORMAL HIGH (ref 7–21)
BUN / CREAT RATIO: 26
CALCIUM: 8.4 mg/dL — ABNORMAL LOW (ref 8.5–10.2)
CHLORIDE: 110 mmol/L — ABNORMAL HIGH (ref 98–107)
CO2: 25 mmol/L (ref 22.0–30.0)
EGFR CKD-EPI AA FEMALE: 40 mL/min/{1.73_m2} — ABNORMAL LOW (ref >=60)
EGFR CKD-EPI NON-AA FEMALE: 35 mL/min/{1.73_m2} — ABNORMAL LOW (ref >=60)
GLUCOSE RANDOM: 171 mg/dL (ref 65–179)
POTASSIUM: 3.8 mmol/L (ref 3.5–5.0)
PROTEIN TOTAL: 6.1 g/dL — ABNORMAL LOW (ref 6.5–8.3)
SODIUM: 142 mmol/L (ref 135–145)

## 2018-03-30 LAB — MANUAL DIFFERENTIAL
BASOPHILS - REL (DIFF): 0 %
BLASTS - REL (DIFF): 17 % (ref <=0)
EOSINOPHILS - ABS (DIFF): 0 10*9/L (ref 0.0–0.4)
LYMPHOCYTES - ABS (DIFF): 1.9 10*9/L (ref 1.5–5.0)
MONOCYTES - ABS (DIFF): 10.5 10*9/L — ABNORMAL HIGH (ref 0.2–0.8)
MONOCYTES - REL (DIFF): 62 %
NEUTROPHILS - ABS (DIFF): 1.7 10*9/L — ABNORMAL LOW (ref 2.0–7.5)
NEUTROPHILS - REL (DIFF): 10 %

## 2018-03-30 LAB — PLATELET COUNT: Lab: 30 — ABNORMAL LOW

## 2018-03-30 LAB — COLLECTION

## 2018-03-30 LAB — BLASTS - REL (DIFF): Lab: 17

## 2018-03-30 LAB — PROTEIN TOTAL: Protein:MCnc:Pt:Ser/Plas:Qn:: 6.1 — ABNORMAL LOW

## 2018-03-30 LAB — HYPOCHROMIA

## 2018-03-30 NOTE — Unmapped (Signed)
E1 Malignant Hematology Admission Note    Assessment/Plan:    Principal Problem:    Acute and chronic respiratory failure with hypoxia (CMS-HCC)  Active Problems:    Acute myeloid leukemia not having achieved remission (CMS-HCC)  Resolved Problems:    * No resolved hospital problems. *    Jessica Wilson is a 77 y.o. female with PMHx of AML who presented to [facility] with Acute and chronic respiratory failure with hypoxia (CMS-HCC) concerning for differentiation syndrome vs leukocyte infiltration vs pneumonia.     Hypoxic respiratory failure: Differential includes differentiation syndrome vs pulmonary leukocyte infiltration vs pneumonia. Pneumonia less likely given the lack of focal signs and recent history of appropriate antibiotic treatment.   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 Malen Gauze as an outpatient as well as Dr. Donneta Romberg locally in [place]. Has been on Enasidenib since 06/2017.   -- Hold Enasidenib   -- Continue hydroxyurea 1g BID  -- f/u myeloid mutation panel from 7/22  -- Daily BMP   -- Daily CBC w/ diff  -- Transfuse for Hgb <7 and platelets <10  -- Transfuse cryoprecipitate for fibrinogen <150 if bleeding or consumptive process??  -- PPx: Valacyclovir  -- Continue Hydroxyurea  -- Consider Dr. Malen Gauze input regarding possible chemotherapy change    Checklist:  Diet: Regular  GI PPx: None  DVT Ppx: SCDs   Code Status: DNR/DNI   Dispo: floor  ___________________________________________________________________    Chief Complaint:  Dyspnea    Acute and chronic respiratory failure with hypoxia (CMS-HCC)    HPI:  Jessica Wilson is a 77 y.o. female with PMHx of AML (on enasidenib) who presented to [facility] infusion clinic with 1 day history of increased shortness of breath, cough, and tiredness.     She was in her usual state of healthl until onset of acute hypoxic respiratory failure requiring hospitalization 7/7-7/15. Differentiation syndrome was considered and she received a short burst of prednisone however, ultimately this was felt to represent PNA and she was started on vanc/cefepime/azithromycin. Narrowed to cefepime and then completed antibiotic course on levaquin. Discharged to SNF on 1-2L [place]. Notably, during that hospital course her enasidenib was stopped and she had a corresponding worsening leukocytosis. She was re-started on enasidenib and discharged on Hydroxyurea.   ??  Since that time patient notes that her SOB has remained the same, no better/worse. Continues to have dyspnea on exertion. She complains of new onset of cough with clear sputum overnight that is now resolved, as well as feeling more tired this morning as when her blood counts are low. She is also feeling more short of breath although she reports that during transport to the hospital she did not have access to supplemental oxygen. She denies any fevers, chills, headaches, changes in vision, chest pain, palpitation, abdominal pain, diarrhea, constipation, dysuria, frequency, bleeding.   ??  She was seen in clinic today 7/22 by her primary oncologist Dr. Malen Gauze. Dr. Malen Gauze expressed concern that her hypoxic respiratory failure had remained unchanged. Additionally, on her CBC diff it was noted to have increased monocytosis in setting of worsening leukemia. She was admitted for further management.     Allergies:  Macrobid [nitrofurantoin monohyd/m-cryst]    Medications:   Prior to Admission medications    Medication Dose, Route, Frequency   acetaminophen (TYLENOL) 325 MG tablet 650 mg, Oral,  Every 6 hours PRN   acyclovir (ZOVIRAX) 400 MG tablet 400 mg, Oral, 2 times a day (standard)   buPROPion (WELLBUTRIN SR) 150 MG 12 hr tablet 150 mg, Oral, 2 times a day (standard)   chlorhexidine (PERIDEX) 0.12 % solution 15 mL, Mouth, 2 times a day (standard)   clonazePAM (KLONOPIN) 0.5 MG tablet 0.5 mg, Oral, 2 times a day PRN diltiazem (CARDIZEM CD) 180 MG 24 hr capsule 180 mg, Oral, Daily (standard)   enasidenib (IDHIFA) 100 mg tablet 100 mg, Oral, Daily (standard)   hydroxyurea (HYDREA) 500 mg capsule 1,000 mg, Oral, 2 times a day (standard)   lidocaine-prilocaine (EMLA) cream Apply 1 application topically as needed. Apply small amount to port site at least 1 hour prior to it being accessed, cover with plastic wrap   ondansetron (ZOFRAN) 4 MG tablet No dose, route, or frequency recorded.   oxyCODONE (ROXICODONE) 5 MG immediate release tablet TK 1 T PO Q 8 H PRN   sertraline (ZOLOFT) 100 MG tablet 100 mg, Oral, Daily (standard)   umeclidinium-vilanterol (ANORO ELLIPTA) 62.5-25 mcg/actuation inhaler 1 puff, Inhalation, Daily (standard)     Oncology History    Referring/Local Oncologist: Dr. Louretta Shorten, [facility]    Diagnosis: MDS with Excess Blasts-2    Genetics:   Karyotype/FISH: 46XX     Molecular Genetics: not performed    Pertinent Phenotypic data: no aberrant immunophenotype by flow cytometry, however blasts stained by IHC for CD117, MPO.  Only 5% marrow cells stained for CD34.    Disease-specific prognostic estimation: IPSS-R high risk, median OS 1.6 years, with 25% AML risk at 1.4 years            MDS (myelodysplastic syndrome), high grade (CMS-HCC)    04/17/2016 Initial Diagnosis     MDS (myelodysplastic syndrome), high grade (RAF-HCC)      04/29/2016 -  Chemotherapy     Azacitidine cycle 1: 75 mg/m2 [place] days 1-7 of 28-day cycles      05/27/2016 -  Chemotherapy     Azacitidine cycle 2: 75 mg/m2 [place] days 1-7 of 28 day cycles      06/24/2016 -  Chemotherapy     Azacitidine cycle 3: 75 mg/m2 [place] days 1-7 of 28 day cycle      07/22/2016 -  Chemotherapy     Azacitidine cycle 4: 75 mg/m2 [place] days 1-7 of 28 day cycle      08/19/2016 -  Chemotherapy     Azacitidine cycle 5: 75 mg/m2 [place] x 7 days of 28 day cycle      09/16/2016 -  Chemotherapy     Azacitidine cycle 6: 75 mbg/m2 [place] x 7 days of 28 day cycle      10/14/2016 - Chemotherapy     Azacitidine cycle 7: 75 mg/m2 [place] x 7 days of 28 day cycle      12/09/2016 -  Chemotherapy     Azacitidine cycle 8: 60 mg/m2 [place] x 7 days of 28 day (cycle reduced by 20% due to hematologic toxicity)      01/13/2017 -  Chemotherapy     Azacitidine cycle 9: 60 mg/m2 [place] x 5 days of 28 day cycle (reduced by 2 days and 20% per dose due to hematologic toxicity)      05/29/2017 Progression     Given increasing transfusion requirements, repeat bone marrow biopsy done, now with 35% blasts and meets criteria for progression to AML.        Acute  myeloid leukemia not having achieved remission (CMS-HCC)    06/02/2017 Initial Diagnosis     Acute myeloid leukemia not having achieved remission      06/28/2017 - 07/17/2017 Chemotherapy     enasidenib 100 mg by mouth daily      07/17/2017 Adverse Reaction     Acute abdominal pain, splenomegaly, AKI , elevated transaminases.  Enasidenib held thru 11/13 and treated empirically for differentiation syndrome with dexamethasone. Resumed enasidenib 07/23/17      07/23/2017 -  Chemotherapy     enasidenib 100 mg PO daily      07/23/2017 -  Chemotherapy     Chemotherapy Treatment    Treatment Goal Control   Line of Treatment [No plan line of treatment]   Plan Name Enasidenib    Start Date 07/23/2017   End Date 03/18/2018   Provider Kevan Rosebush Zada Finders, MD   Chemotherapy enasidenib [facility]) tablet 100 mg, 100 mg, Oral, Every 24 hours, 2 of 2 cycles  Administration: 100 mg (07/23/2017)          Medical History:  Past Medical History:   Diagnosis Date   ??? Anxiety disorder    ??? Chronic kidney disease    ??? Depression    ??? Diabetes mellitus without complication (CMS-HCC)    ??? Fibrocystic breast    ??? GERD (gastroesophageal reflux disease)    ??? Hyperlipidemia    ??? Hypertension    ??? MDS (myelodysplastic syndrome), high grade (CMS-HCC) 04/17/2016   ??? Nephrolithiasis    ??? Obesity    ??? Sleep apnea      Surgical History:  Past Surgical History:   Procedure Laterality Date   ??? CARDIAC CATHETERIZATION     ??? CHOLECYSTECTOMY  02/24/2015   ??? DIAGNOSTIC LAPAROSCOPY      removal of benign abdominal tumor   ??? EXCISION / BIOPSY BREAST / NIPPLE / DUCT Right Age 56   ??? HYSTERECTOMY  age 47     Social History:  Social History     Socioeconomic History   ??? Marital status: Married     Spouse name: Not on file   ??? Number of children: Not on file   ??? Years of education: Not on file   ??? Highest education level: Not on file   Occupational History   ??? Occupation: farmer     Comment: dairy farming   ??? Occupation: Airline pilot     Comment: super Games developer, retired   Social Needs   ??? Financial resource strain: Not on file   ??? Food insecurity:     Worry: Not on file     Inability: Not on file   ??? Transportation needs:     Medical: Not on file     Non-medical: Not on file   Tobacco Use   ??? Smoking status: Former Smoker     Types: Cigarettes     Last attempt to quit: 09/09/1988     Years since quitting: 29.5   ??? Smokeless tobacco: Former Neurosurgeon     Quit date: 06/05/1991   Substance and Sexual Activity   ??? Alcohol use: No   ??? Drug use: No   ??? Sexual activity: Not on file   Lifestyle   ??? Physical activity:     Days per week: Not on file     Minutes per session: Not on file   ??? Stress: Not on file   Relationships   ??? Social connections:     Talks on phone:  Not on file     Gets together: Not on file     Attends religious service: Not on file     Active member of club or organization: Not on file     Attends meetings of clubs or organizations: Not on file     Relationship status: Not on file   Other Topics Concern   ??? Not on file   Social History Narrative   ??? Not on file       Family History:  Family History   Problem Relation Age of Onset   ??? Heart failure Mother    ??? Diabetes Mother    ??? Coronary artery disease Mother    ??? Cirrhosis Father      Review of Systems:  10 systems reviewed and are negative unless otherwise mentioned in HPI    Labs/Studies:  Labs and Studies from the last 24hrs per EMR and Reviewed  I have reviewed the labs and studies from the last 24 hours.    Physical Exam:  Temp:  [36.3 ??C-37.2 ??C] 36.9 ??C  Heart Rate:  [82-97] 94  Resp:  [18-22] 18  BP: (127-155)/(58-70) 147/63  SpO2:  [92 %-94 %] 92 %    General: tired appearing, lying down in bed  HEENT:   - Head: normocephalic, atraumatic  - Eyes: PERRL, disconjugate gaze, anicteric sclera, no conjunctival pallor  - Ears: hearing intact  - Nose: no mucosal congestion  - Mouth: moist mucous membranes, no erythema, no lesions  - Neck: supple, no thyromegaly, no JVD  Lymphatics: No adenopathy   Chest/Lungs: on 2L oxygen, dyspneic, clear to auscultation   Heart: systolic murmur across upper sternal borders  Abdomen: soft, nontender, nondistended, no masses or organomegaly  Extremities: peripheral pulses normal, no pedal edema, no clubbing or cyanosis  Neurological: alert, oriented, normal speech, no focal findings or movement disorder noted  Psychiatric: normal mood, behavior, speech, dress, motor activity, and thought processes

## 2018-03-30 NOTE — Unmapped (Addendum)
Jessica Wilson is a 77 y.o. female with PMHx of AML who presented to [facility] with acute on chronic respiratory failure with hypoxia concerning for differentiation syndrome vs leukocyte infiltration vs pneumonia.     Hypoxic respiratory failure:??Most likely 2/2 pulmonary leukocyte infiltration. Pneumonia less likely given the lack of focal signs and recent history of appropriate antibiotic treatment. However, CT on 7/22 showed new b/l ground glass opacities and R-sided pleural effusion and she had worsening respiratory distress and fungal pneumonia was still on the differential; she was treated empirically treated with antifungals from 7/27-7/29. VRP negative. BAL cx's no growth at 4 days. Thoracentesis was performed and consistent with exudative effusion. Chest tube with bloody drainage, pulm did not feel it was consistent with hemothorax. She also, completed 5 day course of prednisone and levofloxacin for possible COPD exacerbation. She remained on 1-2L after chest tube placement, but SpO2 stayed above 90% on 6 minute walk. She was discharged home with *** in place to prevent re-accumulation of pleural effusion.  ??  A-fib with RVR: Has a h/o A-fib and is on diltiazem at home. This morning she was tachycardic to the 130s with ECG significant for A-fib. She was switched to metoprolol due to concern to for interaction between diltiazem and future chemotherapy. This is most likely in the setting of her current hospital admission. HR was consistently <110.    ??  AKI: Likely related to over-diuresis. With hyperkalemia, hyperphosphatemia, and elevated uric acid. ECG with no hyperkalemic changes. Cr improved and she was discharged at baseline Cr***.  ??  Hyperglycemia: Required insulin in the setting of steroids. She did not use insulin at home and this was weaned after discontinuing prednisone on 8/1.    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 Malen Gauze as an outpatient as well as Dr. Donneta Romberg locally in [place]. Was on Enasidenib since 06/2017 and this was discontinued at admission. Her leukemia mutation panel came back and she was FLT3 negative. Given her frail condition and likely spread of her leukemia to involve the lungs, no new chemotherapy was started in the hospital. She will follow-up with Dr. Malen Gauze regarding outpatient chemotherapy.

## 2018-03-30 NOTE — Unmapped (Signed)
[facility] Leukemia Clinic Follow-up    Patient Name: Jessica Wilson  Patient Age: 77 y.o.  Encounter Date: 03/30/2018    Primary Care Provider:  Kerman Passey, MD    Referring Physician:  Loyal Buba, MD  [address]  [place], [place] [address]    Reason for visit:  Follow up of MDS progressed to AML, on enasidenib    History of Present Illness:  We had the pleasure of seeing Jessica Wilson in the Leukemia Clinic at the [place] [facility] [place] on 03/30/2018.  She is a 77 y.o. 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      Current therapy is enasidenib 100 mg PO daily which we will STOP today.     Her oncologic history is as follows:    Oncology History    Referring/Local Oncologist: Dr. Louretta Shorten, [facility]    Diagnosis: MDS with Excess Blasts-2    Genetics:   Karyotype/FISH: 46XX     Molecular Genetics: not performed    Pertinent Phenotypic data: no aberrant immunophenotype by flow cytometry, however blasts stained by IHC for CD117, MPO.  Only 5% marrow cells stained for CD34.    Disease-specific prognostic estimation: IPSS-R high risk, median OS 1.6 years, with 25% AML risk at 1.4 years            MDS (myelodysplastic syndrome), high grade (CMS-HCC)    04/17/2016 Initial Diagnosis     MDS (myelodysplastic syndrome), high grade (RAF-HCC)      04/29/2016 -  Chemotherapy     Azacitidine cycle 1: 75 mg/m2 [place] days 1-7 of 28-day cycles      05/27/2016 -  Chemotherapy     Azacitidine cycle 2: 75 mg/m2 [place] days 1-7 of 28 day cycles      06/24/2016 -  Chemotherapy     Azacitidine cycle 3: 75 mg/m2 [place] days 1-7 of 28 day cycle      07/22/2016 -  Chemotherapy     Azacitidine cycle 4: 75 mg/m2 [place] days 1-7 of 28 day cycle      08/19/2016 -  Chemotherapy     Azacitidine cycle 5: 75 mg/m2 [place] x 7 days of 28 day cycle      09/16/2016 -  Chemotherapy     Azacitidine cycle 6: 75 mbg/m2 [place] x 7 days of 28 day cycle      10/14/2016 -  Chemotherapy Azacitidine cycle 7: 75 mg/m2 [place] x 7 days of 28 day cycle      12/09/2016 -  Chemotherapy     Azacitidine cycle 8: 60 mg/m2 [place] x 7 days of 28 day (cycle reduced by 20% due to hematologic toxicity)      01/13/2017 -  Chemotherapy     Azacitidine cycle 9: 60 mg/m2 [place] x 5 days of 28 day cycle (reduced by 2 days and 20% per dose due to hematologic toxicity)      05/29/2017 Progression     Given increasing transfusion requirements, repeat bone marrow biopsy done, now with 35% blasts and meets criteria for progression to AML.        Acute myeloid leukemia not having achieved remission (CMS-HCC)    06/02/2017 Initial Diagnosis     Acute myeloid leukemia not having achieved remission      06/28/2017 - 07/17/2017 Chemotherapy     enasidenib 100 mg by mouth daily      07/17/2017 Adverse Reaction  Acute abdominal pain, splenomegaly, AKI , elevated transaminases.  Enasidenib held thru 11/13 and treated empirically for differentiation syndrome with dexamethasone. Resumed enasidenib 07/23/17      07/23/2017 -  Chemotherapy     enasidenib 100 mg PO daily      07/23/2017 -  Chemotherapy     Chemotherapy Treatment    Treatment Goal Control   Line of Treatment [No plan line of treatment]   Plan Name Enasidenib    Start Date 07/23/2017   End Date 03/18/2018   Provider Kevan Rosebush Zada Finders, MD   Chemotherapy enasidenib [facility]) tablet 100 mg, 100 mg, Oral, Every 24 hours, 2 of 2 cycles  Administration: 100 mg (07/23/2017)          Interim History:  Since last seen here, [name] has been hospitalized for acute hypoxic respiratory failure with infiltrates. She was on broad spectrum antibiotics and completed a course of levaquin on 7/13.     Her enasidenib was initially held but was resumed on 7/10. Discharged 03/23/18 (about 1 week ago). She has been at rehab at [facility] in [place] since discharge.     Patient continues to have dyspnea and hypoxia at rehab since discharge. She states that her sats were in mid 80s today off oxygen and drop when she is participating in rehab. She was discharged on 1 L O2 via [place] and is on 2 L O2 via  today with sats 94%. She has had increasing productive cough over past 2 days. She has been coughing up a lot of phlegm which is leading to nausea and vomiting. She reports loss of appetite and has lost 17 pounds in past 3 weeks. She denies fevers throughout her entire course (including recent hospitalization).  Continues to have easy bruising but no bleeding. No lightheadedness, palpitations or chest pain. Reports feeling a little more fatigued today and could tell that her blood counts were likely low. She does have a new tender nodule on right leg that occurred over the last week.     Past Medical, Surgical and Family History were reviewed and pertinent updates were made in the Electronic Medical Record    Review of Systems:  Other than as reported above in the interim history, all other systems reviewed were negative.    ECOG Performance Status: 2    Medications:    Current Outpatient Medications   Medication Sig Dispense Refill   ??? acetaminophen (TYLENOL) 325 MG tablet Take 650 mg by mouth every six (6) hours as needed for pain.     ??? acyclovir (ZOVIRAX) 400 MG tablet Take 400 mg by mouth Two (2) times a day.      ??? buPROPion (WELLBUTRIN SR) 150 MG 12 hr tablet Take 150 mg by mouth Two (2) times a day.     ??? chlorhexidine (PERIDEX) 0.12 % solution 15 mL by Mouth route Two (2) times a day. 473 mL 4   ??? clonazePAM (KLONOPIN) 0.5 MG tablet Take 0.5 mg by mouth two (2) times a day as needed for anxiety.      ??? diltiazem (CARDIZEM CD) 180 MG 24 hr capsule Take 180 mg by mouth daily.     ??? enasidenib (IDHIFA) 100 mg tablet Take 1 tablet (100 mg total) by mouth daily. 30 tablet 6   ??? hydroxyurea (HYDREA) 500 mg capsule Take 2 capsules (1,000 mg total) by mouth Two (2) times a day. 120 capsule 2   ??? lidocaine-prilocaine (EMLA) cream Apply 1 application topically  as needed. Apply small amount to port site at least 1 hour prior to it being accessed, cover with plastic wrap     ??? ondansetron (ZOFRAN) 4 MG tablet   0   ??? oxyCODONE (ROXICODONE) 5 MG immediate release tablet TK 1 T PO Q 8 H PRN  0   ??? sertraline (ZOLOFT) 100 MG tablet Take 100 mg by mouth daily.      ??? umeclidinium-vilanterol (ANORO ELLIPTA) 62.5-25 mcg/actuation inhaler Inhale 1 puff daily.       Current Facility-Administered Medications   Medication Dose Route Frequency Provider Last Rate Last Dose   ??? ondansetron (ZOFRAN) 8 mg in sodium chloride (NS) 0.9 % 25 mL IVPB  8 mg Intravenous Once Doneta Public, MD         Vital Signs:  Vitals:    03/30/18 0933   BP: 131/60   Pulse: 82   Resp: 18   Temp: 36.3 ??C (97.4 ??F)   SpO2: 94%     Physical Exam:  General: Seated in wheelchair, fatigued appearing but no acute distress  HEENT: Stable disconjugate gaze. No scleral icterus or conjunctival injection. Nares show no bleeding.    Lymph node exam: No cervical or supraclavicular LAD  Heart:  Regular rate and rhythm. S1, S2. No murmurs, gallops or rubs.  Lungs:  Mildly increased work of breathing.  Auscultation of lung fields reveals scattered wheezes without obvious crackles.   GI: No distention or pain on palpation.  Bowel sounds are present and normal in quality.  No palpable hepatomegaly or splenomegaly.  No palpable masses.  Skin:  Scattered ecchymoses and petechiae on lower skin. One small nodule on R lateral shin without overlying erythema or warmth, mildly tender to palpation  Musculoskeletal:  No grossly-evident joint effusions or deformities.  Range of motion about the shoulder, elbow, hips and knees is grossly normal.   Psychiatric:  Alert and oriented to person, place, time and situation.  Range of affect is appropriate.    Neurologic:    Gait is normal.  Cerebellar tasks are completed with ease and are symmetric.  Extremities:  Appear well-perfused. No clubbing. No edema.  No cyanosis.    Relevant Laboratory, radiology and pathology results:  I personally viewed the most recent internal records and labs and discussed the available results with the patient or family.  A summary of results follows:  Lab on 03/30/2018   Component Date Value Ref Range Status   ??? Sodium 03/30/2018 142  135 - 145 mmol/L Final   ??? Potassium 03/30/2018 3.8  3.5 - 5.0 mmol/L Final   ??? Chloride 03/30/2018 110* 98 - 107 mmol/L Final   ??? CO2 03/30/2018 25.0  22.0 - 30.0 mmol/L Final   ??? Anion Gap 03/30/2018 7* 9 - 15 mmol/L Final   ??? BUN 03/30/2018 37* 7 - 21 mg/dL Final   ??? Creatinine 03/30/2018 1.45* 0.60 - 1.00 mg/dL Final   ??? BUN/Creatinine Ratio 03/30/2018 26   Final   ??? EGFR CKD-EPI Non-African American,* 03/30/2018 35* >=60 mL/min/1.65m2 Final   ??? EGFR CKD-EPI African American, Fem* 03/30/2018 40* >=60 mL/min/1.38m2 Final   ??? Glucose 03/30/2018 171  65 - 179 mg/dL Final   ??? Calcium 16/06/9603 8.4* 8.5 - 10.2 mg/dL Final   ??? Albumin 54/05/8118 3.3* 3.5 - 5.0 g/dL Final   ??? Total Protein 03/30/2018 6.1* 6.5 - 8.3 g/dL Final   ??? Total Bilirubin 03/30/2018 1.5* 0.0 - 1.2 mg/dL Final   ??? AST 14/78/2956  16  14 - 38 U/L Final   ??? ALT 03/30/2018 16  15 - 48 U/L Final   ??? Alkaline Phosphatase 03/30/2018 63  38 - 126 U/L Final   ??? WBC 03/30/2018 16.9* 4.5 - 11.0 10*9/L Final   ??? RBC 03/30/2018 2.33* 4.00 - 5.20 10*12/L Final   ??? HGB 03/30/2018 7.1* 12.0 - 16.0 g/dL Final   ??? HCT 24/40/1027 22.4* 36.0 - 46.0 % Final   ??? MCV 03/30/2018 95.9  80.0 - 100.0 fL Final   ??? MCH 03/30/2018 30.5  26.0 - 34.0 pg Final   ??? MCHC 03/30/2018 31.8  31.0 - 37.0 g/dL Final   ??? RDW 25/36/6440 22.8* 12.0 - 15.0 % Final   ??? MPV 03/30/2018 8.5  7.0 - 10.0 fL Final   ??? Platelet 03/30/2018 <5* 150 - 440 10*9/L Final    Questionable result confirmed. Specimen integrity/identity confirmed.    ??? Variable HGB Concentration 03/30/2018 Slight* Not Present Final   ??? Microcytosis 03/30/2018 Slight* Not Present Final   ??? Macrocytosis 03/30/2018 Marked* Not Present Final   ??? Anisocytosis 03/30/2018 Marked* Not Present Final   ??? Hypochromasia 03/30/2018 Marked* Not Present Final   ??? Neutrophils % 03/30/2018 10  % Final   ??? Lymphocytes % 03/30/2018 11  % Final   ??? Monocytes % 03/30/2018 62  % Final   ??? Eosinophils % 03/30/2018 0  % Final   ??? Basophils % 03/30/2018 0  % Final   ??? Blasts % 03/30/2018 17* <=0 % Final   ??? Absolute Neutrophils 03/30/2018 1.7* 2.0 - 7.5 10*9/L Final   ??? Absolute Lymphocytes 03/30/2018 1.9  1.5 - 5.0 10*9/L Final   ??? Absolute Monocytes 03/30/2018 10.5* 0.2 - 0.8 10*9/L Final   ??? Absolute Eosinophils 03/30/2018 0.0  0.0 - 0.4 10*9/L Final   ??? Absolute Basophils 03/30/2018 0.0  0.0 - 0.1 10*9/L Final   ??? Smear Review Comments 03/30/2018 See Comment* Undefined Final    Blasts Present. Myelocytes present.      Assessment:  [name] is a 77 y.o. year old female with AML progressed from previous Myelodysplastic Syndrome s/p 12 cycles of azacitidine prior to progression to AML. She initially had hematologic improvement but had eventual progression requiring more transfusions and with 38% blasts in the bone marrow. She was started on enasidenib on 06/28/17, and had clearance of peripheral blood blasts, avoidance of severe neutropenia and improvement in her hemoglobin and thrombocytopenia.    Today, [name] has reappearance of circulating blasts and has recently been hospitalized for acute hypoxic respiratory failure.  This was assumed to be secondary to pneumonia, for which she completed treatment with a course of levaquin. This is also in the context of progressing leukemia with a WBC of 25K and 25% blasts on admission to [facility] a few weeks ago. Her enasidenib has now been restarted about 10 days ago. We reviewed the appearance of her prior CT scan in clinic today and the appearance of the scattered nodular opacities appears consistent with either an atypical infection or leukemic infiltration. Her WBC is now 16.9K with 17% blasts on 1 g bid hydrea and enasidenib. Her monocytes continue to rise at 10.5 today. 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 Therefore, we will stop the enasidenib and start steroids.  We discussed with the patient that it will be safest to admit her to the hospital today so we can get a repeat CT, start the steroids and see how she does as an inpatient. Regarding her relapsing leukemia, we have sent a myeloid mutation panel today. In addition to [facility] mutation, she had a low allelic ratio FLT3-ITD mutation on last marrow 06/11/17 so wonder if her FLT3 mutation could be driving this relapse. If FLT3 still present and allelic ratio higher, would consider gilteritinib. If no targetable mutation, can consider low dose Ara-C + venetoclax. She has failed azacitidine in the past, so would not consider a hypomethylating agent as an option for her. She has a poor performance status and multiple comorbidities so she is not a candidate for most clinical trials or more intensive therapy.     Plan and Recommendations:    **AML, secondary after MDS: Currently with progression of disease. WBC 16.9 today with 17% blasts. Worsening thrombocytopenia.   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 Patient has not had a recent prolonged period of severe neutropenia so believe it is less likely a fungal infection but certainly possible. Will need to observe closely on high dose steroids given that there is a possibility of under-treated or untreated infection.   -- Please obtain repeat chest CT to re-assess infiltrates detected on outside CT about 3 weeks ago (has been uploaded here for comparison purposes)  -- Consider pulmonary service consultation  -- STOP enasidenib  -- START dexamethasone 4 mg bid  -- continue PRN nebulizers given wheezing on exam today    Supportive Care Recommendations:  We recommend based on the patient???s underlying diagnosis and treatment history the following supportive care:    1. Antimicrobial prophylaxis:  Other: seasonal and age appropriate vaccines.  Valacyclovir for shinglse/HSV prophylaxis    2. Blood product support:  Leukoreduced blood products are required.  Irradiated blood products are preferred, but in case of urgent transfusion needs non-irradiated blood products may be used:     -  RBC transfusion threshold: transfuse 2 units for Hgb < 8 g/dL.  -  Platelet transfusion threshold: transfuse 1 unit of platelets for platelet count < 10, or for bleeding or need for invasive procedure.    3. Hematopoietic growth factor support: none     Patient seen and discussed with Dr. Malen Gauze.     Starr Sinclair, MD  PGY-5  Hematology/Oncology Fellow

## 2018-03-30 NOTE — Unmapped (Addendum)
It was great to see you today!    We are going to admit you to the hospital today.      We will give you blood and platelets today.    We will stop the [facility] and start steroids for a few days.     Labwork:  Lab on 03/30/2018   Component Date Value Ref Range Status   ??? Sodium 03/30/2018 142  135 - 145 mmol/L Final   ??? Potassium 03/30/2018 3.8  3.5 - 5.0 mmol/L Final   ??? Chloride 03/30/2018 110* 98 - 107 mmol/L Final   ??? CO2 03/30/2018 25.0  22.0 - 30.0 mmol/L Final   ??? Anion Gap 03/30/2018 7* 9 - 15 mmol/L Final   ??? BUN 03/30/2018 37* 7 - 21 mg/dL Final   ??? Creatinine 03/30/2018 1.45* 0.60 - 1.00 mg/dL Final   ??? BUN/Creatinine Ratio 03/30/2018 26   Final   ??? EGFR CKD-EPI Non-African American,* 03/30/2018 35* >=60 mL/min/1.6m2 Final   ??? EGFR CKD-EPI African American, Fem* 03/30/2018 40* >=60 mL/min/1.71m2 Final   ??? Glucose 03/30/2018 171  65 - 179 mg/dL Final   ??? Calcium 16/06/9603 8.4* 8.5 - 10.2 mg/dL Final   ??? Albumin 54/05/8118 3.3* 3.5 - 5.0 g/dL Final   ??? Total Protein 03/30/2018 6.1* 6.5 - 8.3 g/dL Final   ??? Total Bilirubin 03/30/2018 1.5* 0.0 - 1.2 mg/dL Final   ??? AST 14/78/2956 16  14 - 38 U/L Final   ??? ALT 03/30/2018 16  15 - 48 U/L Final   ??? Alkaline Phosphatase 03/30/2018 63  38 - 126 U/L Final   ??? WBC 03/30/2018 16.9* 4.5 - 11.0 10*9/L Final   ??? RBC 03/30/2018 2.33* 4.00 - 5.20 10*12/L Final   ??? HGB 03/30/2018 7.1* 12.0 - 16.0 g/dL Final   ??? HCT 21/30/8657 22.4* 36.0 - 46.0 % Final   ??? MCV 03/30/2018 95.9  80.0 - 100.0 fL Final   ??? MCH 03/30/2018 30.5  26.0 - 34.0 pg Final   ??? MCHC 03/30/2018 31.8  31.0 - 37.0 g/dL Final   ??? RDW 84/69/6295 22.8* 12.0 - 15.0 % Final   ??? MPV 03/30/2018 8.5  7.0 - 10.0 fL Final   ??? Platelet 03/30/2018 <5* 150 - 440 10*9/L Final    Questionable result confirmed. Specimen integrity/identity confirmed.    ??? Variable HGB Concentration 03/30/2018 Slight* Not Present Final   ??? Microcytosis 03/30/2018 Slight* Not Present Final   ??? Macrocytosis 03/30/2018 Marked* Not Present Final ??? Anisocytosis 03/30/2018 Marked* Not Present Final   ??? Hypochromasia 03/30/2018 Marked* Not Present Final   ??? Neutrophils % 03/30/2018 10  % Final   ??? Lymphocytes % 03/30/2018 11  % Final   ??? Monocytes % 03/30/2018 62  % Final   ??? Eosinophils % 03/30/2018 0  % Final   ??? Basophils % 03/30/2018 0  % Final   ??? Blasts % 03/30/2018 17* <=0 % Final   ??? Absolute Neutrophils 03/30/2018 1.7* 2.0 - 7.5 10*9/L Final   ??? Absolute Lymphocytes 03/30/2018 1.9  1.5 - 5.0 10*9/L Final   ??? Absolute Monocytes 03/30/2018 10.5* 0.2 - 0.8 10*9/L Final   ??? Absolute Eosinophils 03/30/2018 0.0  0.0 - 0.4 10*9/L Final   ??? Absolute Basophils 03/30/2018 0.0  0.0 - 0.1 10*9/L Final   ??? Smear Review Comments 03/30/2018 See Comment* Undefined Final    Blasts Present. Myelocytes present.        Please call us if you experience:  1. Nausea or vomiting not controlled by nausea medicines  2. Fever of 100.4 F or higher   3. Uncontrolled pain  4. Any other concerning symptom     For any cancer-related concerns, including:   Appointment information   Questions regarding your cancer diagnosis or treatment   Any new symptoms.     Please call [phone].    On Nights, Weekends and Holidays please and ask for the oncologist on call.    [facility]  [address]  [place], [place] [address]  [email or web address]

## 2018-03-30 NOTE — Unmapped (Signed)
0745:  Labs drawn and sent for analysis.  Care provided by  Eppie Gibson, RN

## 2018-03-31 LAB — CBC W/ AUTO DIFF
HEMOGLOBIN: 9.3 g/dL — ABNORMAL LOW (ref 12.0–16.0)
MEAN CORPUSCULAR HEMOGLOBIN: 30.2 pg (ref 26.0–34.0)
MEAN CORPUSCULAR VOLUME: 93.9 fL (ref 80.0–100.0)
MEAN PLATELET VOLUME: 7.2 fL (ref 7.0–10.0)
PLATELET COUNT: 35 10*9/L — ABNORMAL LOW (ref 150–440)
RED BLOOD CELL COUNT: 3.09 10*12/L — ABNORMAL LOW (ref 4.00–5.20)

## 2018-03-31 LAB — MANUAL DIFFERENTIAL
BASOPHILS - REL (DIFF): 1 %
BLASTS - REL (DIFF): 5 % (ref <=0)
EOSINOPHILS - ABS (DIFF): 0 10*9/L (ref 0.0–0.4)
EOSINOPHILS - REL (DIFF): 0 %
LYMPHOCYTES - ABS (DIFF): 2.6 10*9/L (ref 1.5–5.0)
LYMPHOCYTES - REL (DIFF): 13 %
MONOCYTES - ABS (DIFF): 9.7 10*9/L — ABNORMAL HIGH (ref 0.2–0.8)
MONOCYTES - REL (DIFF): 48 %
NEUTROPHILS - REL (DIFF): 33 %

## 2018-03-31 LAB — BASIC METABOLIC PANEL
ANION GAP: 12 mmol/L (ref 9–15)
BLOOD UREA NITROGEN: 31 mg/dL — ABNORMAL HIGH (ref 7–21)
BUN / CREAT RATIO: 25
CALCIUM: 8.5 mg/dL (ref 8.5–10.2)
CHLORIDE: 108 mmol/L — ABNORMAL HIGH (ref 98–107)
CO2: 22 mmol/L (ref 22.0–30.0)
CREATININE: 1.24 mg/dL — ABNORMAL HIGH (ref 0.60–1.00)
EGFR CKD-EPI AA FEMALE: 49 mL/min/{1.73_m2} — ABNORMAL LOW (ref >=60)
EGFR CKD-EPI NON-AA FEMALE: 42 mL/min/{1.73_m2} — ABNORMAL LOW (ref >=60)
GLUCOSE RANDOM: 170 mg/dL (ref 65–179)
POTASSIUM: 4.3 mmol/L (ref 3.5–5.0)
SODIUM: 142 mmol/L (ref 135–145)

## 2018-03-31 LAB — HEPATIC FUNCTION PANEL
ALBUMIN: 3.6 g/dL (ref 3.5–5.0)
ALKALINE PHOSPHATASE: 68 U/L (ref 38–126)
AST (SGOT): 17 U/L (ref 14–38)
BILIRUBIN DIRECT: 0.2 mg/dL (ref 0.00–0.40)
BILIRUBIN TOTAL: 1.1 mg/dL (ref 0.0–1.2)

## 2018-03-31 LAB — BILIRUBIN DIRECT: Bilirubin.glucuronidated:MCnc:Pt:Ser/Plas:Qn:: 0.2

## 2018-03-31 LAB — MEAN CORPUSCULAR HEMOGLOBIN CONC: Lab: 32.1

## 2018-03-31 LAB — PRO-BNP: Natriuretic peptide.B prohormone N-Terminal:MCnc:Pt:Ser/Plas:Qn:: 1610 — ABNORMAL HIGH

## 2018-03-31 LAB — MAGNESIUM: Magnesium:MCnc:Pt:Ser/Plas:Qn:: 1.6

## 2018-03-31 LAB — PHOSPHORUS: Phosphate:MCnc:Pt:Ser/Plas:Qn:: 4

## 2018-03-31 LAB — LYMPHOCYTES - ABS (DIFF): Lab: 2.6

## 2018-03-31 LAB — EGFR CKD-EPI NON-AA FEMALE: Lab: 42 — ABNORMAL LOW

## 2018-03-31 MED ORDER — ACYCLOVIR 800 MG PO TABS
400.00 | ORAL_TABLET | ORAL | Status: DC
Start: 2018-04-16 — End: 2018-03-31

## 2018-03-31 MED ORDER — SODIUM CHLORIDE 0.9 % IV SOLN
20.00 | INTRAVENOUS | Status: DC
Start: ? — End: 2018-03-31

## 2018-03-31 MED ORDER — HYDROXYUREA 500 MG PO CAPS
1000.00 | ORAL_CAPSULE | ORAL | Status: DC
Start: 2018-03-31 — End: 2018-03-31

## 2018-03-31 MED ORDER — LOPERAMIDE HCL 2 MG PO CAPS
4.00 | ORAL_CAPSULE | ORAL | Status: DC
Start: ? — End: 2018-03-31

## 2018-03-31 MED ORDER — BUPROPION HCL ER (SR) 150 MG PO TB12
150.00 | ORAL_TABLET | ORAL | Status: DC
Start: 2018-04-16 — End: 2018-03-31

## 2018-03-31 MED ORDER — CETIRIZINE HCL 10 MG PO TABS
10.00 | ORAL_TABLET | ORAL | Status: DC
Start: ? — End: 2018-03-31

## 2018-03-31 MED ORDER — PROCHLORPERAZINE MALEATE 5 MG PO TABS
5.00 | ORAL_TABLET | ORAL | Status: DC
Start: ? — End: 2018-03-31

## 2018-03-31 MED ORDER — DEXAMETHASONE SODIUM PHOSPHATE 4 MG/ML IJ SOLN
4.00 | INTRAMUSCULAR | Status: DC
Start: 2018-03-31 — End: 2018-03-31

## 2018-03-31 MED ORDER — SODIUM CHLORIDE 0.9 % IJ SOLN
10.00 | INTRAMUSCULAR | Status: DC
Start: 2018-04-16 — End: 2018-03-31

## 2018-03-31 MED ORDER — DILTIAZEM HCL ER COATED BEADS 180 MG PO CP24
180.00 | ORAL_CAPSULE | ORAL | Status: DC
Start: 2018-04-01 — End: 2018-03-31

## 2018-03-31 MED ORDER — LOPERAMIDE HCL 2 MG PO CAPS
2.00 | ORAL_CAPSULE | ORAL | Status: DC
Start: ? — End: 2018-03-31

## 2018-03-31 MED ORDER — SERTRALINE HCL 100 MG PO TABS
100.00 | ORAL_TABLET | ORAL | Status: DC
Start: 2018-04-17 — End: 2018-03-31

## 2018-03-31 NOTE — Unmapped (Signed)
Care Management  Initial Transition Planning Assessment      Patient is a 77 year old female, with a past medical history of AML who presented to [facility] with Acute and chronic respiratory failure with hypoxia (CMS-HCC) concerning for differentiation syndrome vs leukocyte infiltration vs pneumonia.    CM met with patient and patient's sister at bedside. Patient states that she would like to go to [facility] upon discharge, and not return to [facility]. CM verbalized understanding, and SNF referral sent to [facility].               General  Care Manager assessed the patient by : In person interview with patient, In person interview with family, Medical record review, Discussion with Clinical Care team  Orientation Level: Oriented X4  Who provides care at home?: N/A    Contact/Decision Maker  Patient's Cell Phone: [phone]  Patient's Home Phone: [phone]  Patient's Husband: Roxan Diesel: [phone] (cell)  Patient's Sister: Rayann Heman: [phone] (cell)     Extended Emergency Contact Information  Primary Emergency Contact: Atha Starks [place]  Home Phone: [phone]  Mobile Phone: [phone]  Relation: Spouse  Secondary Emergency Contact: Smith,Shirley   [place]  Mobile Phone: [phone]  Relation: Sister    Legal Next of Kin / Guardian / POA / Advance Directives       Advance Directive (Medical Treatment)  Does patient have an advance directive covering medical treatment?: Patient does not have advance directive covering medical treatment.  Reason patient does not have an advance directive covering medical treatment:: Patient does not wish to complete one at this time  Information provided on advance directive:: No  Patient requests assistance:: No         Patient Information  Lives with: Spouse/significant other    Type of Residence: Private residence  [address]  [place], [place] [address]  [facility] Support Systems: Spouse, Family members, Friends/ neighbors    Responsibilities/Dependents at home?: No    Home Care services in place prior to admission?: No    Equipment Currently Used at Home: none    Currently receiving outpatient dialysis?: No      Patient's Primary Care Provider: Dr. Baruch Gouty      Financial Information  Patient is retired. Patient has [employer] for [employer], and [employer] for [employer].    Need for financial assistance?: No      Social Determinants of Health  Social Determinants of Health were addressed in provider documentation.  Please refer to patient history.    Discharge Needs Assessment  Concerns to be Addressed: basic needs, care coordination/care conferences, discharge planning    Clinical Risk Factors: > 65, Principal Diagnosis: Cancer, Stroke, COPD, Heart Failure, AMI, Pneumonia, Joint Replacment, Multiple Diagnoses (Chronic)    Barriers to taking medications: No    Prior overnight hospital stay or ED visit in last 90 days: Yes     Patient was seen in the ED on 02/24/18 [facility] & 03/08/18 [facility]).    Readmission Within the Last 30 Days: current reason for admission unrelated to previous admission     Patient was hospitalized from July 3-7, 2019 [facility] & July 7-15, 2019 (4 ONC).    Patient's Choice of Community Agency(s): PCP: Dr. Baruch Gouty, whom patient last saw on 03/11/18.    Anticipated Changes Related to Illness: inability to care for self  Equipment Needed After Discharge: none    Discharge Facility/Level of Care Needs: nursing facility, skilled    Readmission  Risk of Unplanned Readmission Score: UNPLANNED READMISSION SCORE: 25%  Readmitted Within the Last 30 Days? Yes  Patient at risk for readmission?: Yes    Discharge Plan  Screen findings are: Discharge planning needs identified or anticipated (Comment).(Patient to go to SNF at discharge)    Expected Discharge Date: 04/03/18    Patient and/or family were provided with choice of facilities / services that are available and appropriate to meet post hospital care needs?: Yes   List choices in order highest to lowest preferred, if applicable. : Patient states that she would like to go to [facility] upon discharge, and not return to [facility].    Initial Assessment complete?: Yes

## 2018-03-31 NOTE — Unmapped (Signed)
PHYSICAL THERAPY  Evaluation(With Jaclyn Shaggy, OT) (03/31/18 1041)     Patient Name:  Jessica Wilson       Medical Record Number: [id number]   Date of Birth: 1940-09-28  Sex: Female            Treatment Diagnosis: Increased SOB, generalized deconditioning    ASSESSMENT    Patient is a 77 y.o. female who presented to [facility] with Acute and chronic respiratory failure with hypoxia (CMS-HCC) concerning for differentiation syndrome vs leukocytic infiltration vs pneumonia. Patient presents today with deficits in endurance, unable to progress with OOB mobility due to c/o SOB with bed mobility this date, and will benefit from skilled acute PT intervention to address these impairments and progress with mobility while in house. Based on the AM-PAC five item raw score of 7/20, the patient is considered to be 84.99% impaired with basic mobility. This indicates that the patient will benefit from PT f/u of 5x/week (low intensity) in order to safely maximize independence with functional mobility and address deficits in endurance/activity tolerance.       Today's Interventions: AM-PAC raw score: 7/20; patient educated on role of PT, PT POC, PT goals, progression of mobility and importance of having RN staff present for safety with any OOB mobility. Education provided on pursed lip/deep breathing techniques.    Activity Tolerance: Patient limited by fatigue    PLAN  Planned Frequency of Treatment:  1-2x per day for: 3-4x week      Planned Interventions: Balance activities;Diaphragmatic / Pursed-lip breathing;Education - Patient;Education - Family / caregiver;Endurance activities;Functional mobility;Self-care / Home training;Transfer training;Therapeutic activity;Gait training;Home exercise program;Therapeutic exercise    Post-Discharge Physical Therapy Recommendations:  5x weekly;Low intensity        PT DME Recommendations: Defer to post acute     Goals:   Patient and Family Goals: To return home    Long Term Goal #1: In 6 weeks, patient will ambulate >200 ft modified independently using LRAD in order to increase independence with functional mobility       SHORT GOAL #1: Patient will complete bed mobility with supervision              Time Frame : 2 weeks  SHORT GOAL #2: Patient will complete OOB mobility assessment              Time Frame : 1 week                                                          Prognosis:  Good  Barriers to Discharge: Endurance deficits;Gait instability  Positive Indicators: PLOF, family support    SUBJECTIVE  Patient reports: Patient and RN agreeable to PT, I'm okay with everything else it's just this breathing that is so bad  Current Functional Status: Patient received and ended session supine in bed with HOB elevated, lines intact, needs within reach, RN aware.      Prior functional status: Prior to admission, patient has been at a rehab facility however reports being limited in overall mobility due to increased SOB/DOE. Patient reports she was able to walk short distances with RW from time to time. At baseline (>3 weeks ago), patient ambulates independently with no device. Denies recent falls within the past ~3 months.   Equipment available at home: Risk analyst  Walker    Past Medical History:   Diagnosis Date   ??? Anxiety disorder    ??? Chronic kidney disease    ??? Depression    ??? Diabetes mellitus without complication (CMS-HCC)    ??? Fibrocystic breast    ??? GERD (gastroesophageal reflux disease)    ??? Hyperlipidemia    ??? Hypertension    ??? MDS (myelodysplastic syndrome), high grade (CMS-HCC) 04/17/2016   ??? Nephrolithiasis    ??? Obesity    ??? Sleep apnea     Social History     Tobacco Use   ??? Smoking status: Former Smoker     Types: Cigarettes     Last attempt to quit: 09/09/1988     Years since quitting: 29.5   ??? Smokeless tobacco: Former Neurosurgeon     Quit date: 06/05/1991   Substance Use Topics   ??? Alcohol use: No      Past Surgical History:   Procedure Laterality Date   ??? CARDIAC CATHETERIZATION     ??? CHOLECYSTECTOMY  02/24/2015   ??? DIAGNOSTIC LAPAROSCOPY      removal of benign abdominal tumor   ??? EXCISION / BIOPSY BREAST / NIPPLE / DUCT Right Age 75   ??? HYSTERECTOMY  age 43    Family History   Problem Relation Age of Onset   ??? Heart failure Mother    ??? Diabetes Mother    ??? Coronary artery disease Mother    ??? Cirrhosis Father         Allergies: Macrobid [nitrofurantoin monohyd/m-cryst]                Objective Findings              Precautions: Falls;Bleeding precautions              Weight Bearing Status: Non- applicable              Required Braces or Orthoses: Non- applicable    Communication Preference: Verbal  Pain Comments: Patient denies pain  Medical Tests / Procedures: Labs, orders, and imaging reviewed in Epic  Equipment / Environment: Vascular access (PIV, TLC, Port-a-cath, PICC);Supplemental oxygen(4L O2 via [place])    At Rest: SpO2 96% on 4L O2 via [place], HR 81 bpm  With Activity: SpO2 95% on 4L O2 via [place]          Living environment: Facility(Rehab facility )  Lives With: Care Staff  Home Living: Shower chair with back;Walk-in shower;One level home;Level entry             Cognition: A&O x4, patient answers questions appropriately     Skin Inspection: Bruising present to bilateral UEs and LEs    UE ROM: WFL  UE Strength: WFL  LE ROM: WFL  LE Strength: WFL                       Sensation: Patient denies numbness/tingling to bilateral UEs/LEs            Bed Mobility: Patient requires mod assist for repositioning in bed  Transfers: Unable to progress with OOB mobility due to pt c/o SOB              Endurance: Limited by pt's c/o fatigue and SOB     Eval Duration(PT): 18 Min.    Medical Staff Made Aware: RN aware     I attest that I have reviewed the above information.  Signed: Fernanda Drum, PT  [facility]  03/31/2018

## 2018-03-31 NOTE — Unmapped (Signed)
Problem: Adult Inpatient Plan of Care  Goal: Plan of Care Review  Outcome: Progressing  Goal: Patient-Specific Goal (Individualization)  Outcome: Progressing  Goal: Absence of Hospital-Acquired Illness or Injury  Outcome: Progressing  Goal: Optimal Comfort and Wellbeing  Outcome: Progressing  Goal: Readiness for Transition of Care  Outcome: Progressing  Goal: Rounds/Family Conference  Outcome: Progressing     Problem: Self-Care Deficit  Goal: Improved Ability to Complete Activities of Daily Living  Outcome: Progressing     Problem: Fall Injury Risk  Goal: Absence of Fall and Fall-Related Injury  Outcome: Progressing     Problem: COPD Comorbidity  Goal: Maintenance of COPD Symptom Control  Outcome: Progressing     Problem: Hypertension Comorbidity  Goal: Blood Pressure in Desired Range  Outcome: Progressing     Problem: Obstructive Sleep Apnea Risk or Actual (Comorbidity Management)  Goal: Unobstructed Breathing During Sleep  Outcome: Progressing     Problem: Pain Chronic (Persistent) (Comorbidity Management)  Goal: Acceptable Pain Control and Functional Ability  Outcome: Progressing

## 2018-03-31 NOTE — Unmapped (Signed)
OCCUPATIONAL THERAPY  Evaluation (03/31/18 1043)    Patient Name:  Jessica Wilson       Medical Record Number: [id number]   Date of Birth: [birth year]  Sex: Female          OT Treatment Diagnosis:  Shortness of breath, decreased activity tolerance     Assessment   Jessica Wilson is a 77 y.o. female with PMHx of AML who presented to [facility] with Acute and chronic respiratory failure with hypoxia (CMS-HCC) concerning for differentiation syndrome vs leukocyte infiltration vs pneumonia. Pt present to acute care OT with shortness of breath and decreased activity tolerance impacting her independence with ADLs. Pt deferred EOB/OOB assessment on this date due to shortness of breath. Pt lives with husband, but came from rehab facility prior to admission. Pt will benefit from skilled acute OT to maximize functional independence and safety. Based on the daily activity AM-PAC raw score of 14/24, the pt is considered to be 59.67% impaired with self care. This indicates she would benefit from post acute OT 5x/wk at discharge. After review of contributing co-mobidities and personal factors, clinical presentation and exam findings, patient demonstrates moderate complexity for evaluation and development of plan of care.      Today's Interventions: Education - Patient;Bed mobility;Conservation    Activity Tolerance During Today's Session  Other(Shortness of breath )    Plan  Planned Frequency of Treatment:  1-2x per day for: 3-4x week       Planned Interventions:  Adaptive equipment;ADL retraining;Balance activities;Bed mobility;Compensatory tech. training;Conservation;Education - Patient;Education - Family / caregiver;Endurance activities;Functional cognition;Functional mobility;Home exercise program;Modalities;Neuromuscular re-education;Postular / Proximal stability;Positioning;Range of motion;Safety education;Therapeutic exercise;Transfer training;UE Strength / coordination exercise    Post-Discharge Occupational Therapy Recommendations:  OT Post Acute Discharge Recommendations: 5x weekly;Low intensity    ;    OT DME Recommendations: Defer to post acute    GOALS:   Patient and Family Goals: To return home     Long Term Goal #1: Pt will score 24/24 on AM-PAC within two months        Short Term:  Pt will complete EOB/OOB functional transfer assessment    Time Frame : 1 week  Pt will state 3 of 3 energy conservation techniques    Time Frame : 1 week  Pt will complete LE dressing with minA and LRAD    Time Frame : 1 week                  Prognosis:  Good  Positive Indicators:  Strong family support  Barriers to Discharge: Inability to safely perform ADLS;Other(SOB )    Subjective  Current Status Pt in bed with call bell within reach, all immediate needs met, nurse aware   Prior Functional Status Pt lives in one level home with spouse, but since her prior admission she has been at a rehab facility. Pt reports utilzing RW since she has been at therehab facility, but was not utilizing one at home. Pt states she has been able to dress herself, but it has taken her extra time due to SOB.     Medical Tests / Procedures: Reviewed      Patient / Caregiver reports: I don't think I can get out of bed today     Past Medical History:   Diagnosis Date   ??? Anxiety disorder    ??? Chronic kidney disease    ??? Depression    ??? Diabetes mellitus without complication (CMS-HCC)    ??? Fibrocystic breast    ???  GERD (gastroesophageal reflux disease)    ??? Hyperlipidemia    ??? Hypertension    ??? MDS (myelodysplastic syndrome), high grade (CMS-HCC) 04/17/2016   ??? Nephrolithiasis    ??? Obesity    ??? Sleep apnea     Social History     Tobacco Use   ??? Smoking status: Former Smoker     Types: Cigarettes     Last attempt to quit: 09/09/1988     Years since quitting: 29.5   ??? Smokeless tobacco: Former Neurosurgeon     Quit date: 06/05/1991   Substance Use Topics   ??? Alcohol use: No      Past Surgical History:   Procedure Laterality Date   ??? CARDIAC CATHETERIZATION     ??? CHOLECYSTECTOMY 02/24/2015   ??? DIAGNOSTIC LAPAROSCOPY      removal of benign abdominal tumor   ??? EXCISION / BIOPSY BREAST / NIPPLE / DUCT Right Age 25   ??? HYSTERECTOMY  age 40    Family History   Problem Relation Age of Onset   ??? Heart failure Mother    ??? Diabetes Mother    ??? Coronary artery disease Mother    ??? Cirrhosis Father         Macrobid [nitrofurantoin monohyd/m-cryst]     Objective Findings  Precautions / Restrictions  Falls precautions    Weight Bearing  Non-applicable    Required Braces or Orthoses  Non-applicable    Communication Preference  Verbal    Pain  No c/o pain     Equipment / Environment  Supplemental oxygen;Vascular access (PIV, TLC, Port-a-cath, PICC)(4 L nasal cannula )    Living Situation   Living environment: Facility(Rehab facility )   Lives With: Care Staff   Home Living: Shower chair with back;Walk-in shower;One level home;Level entry   Equipment available at home: Shower Restaurant manager, fast food            Cognition   Orientation Level:  Oriented x 4(Pt able to identify location and month )   Arousal/Alertness:  Appropriate responses to stimuli   Attention Span:  Appears intact   Memory:  Appears intact   Following Commands:  Follows all commands and directions without difficulty   Safety Judgment:  Unable to assess   Awareness of Errors:  Unable to assess   Problem Solving:  Unable to assess   Comments:      Vision / Perception  Vision: Cataracts(Pt reports no changes in vision buts needs to have cataract surgery soon)          Hand Function  Hand Dominance: R hand dominant   Good BUE grasp    Skin Inspection  Intact     ROM / Strength/Coordination  UE ROM/ Strength/ Coordination: UE ROM and strengh WFL   LE ROM/ Strength/ Coordination: LE ROM WFL, strength not assessed     Sensation:  Pt denies any sensory deficits     Balance:  Pt deferred EOB/OOB at this time due to SOB    Mobility/Gait/Transfers: Pt able to achieve partial bridge to assist with movement towards HOB    ADL:  Bathing: Anticipate ModA Grooming: Setup A for washing face   Dressing: Anticipate ModA  Eating: Setup  Toileting: Unable to tolerate EOB/OOB assessment at this time 2/2 to fatigue        Vitals/ Orthostatics:  At Rest: SpO2: 96% on  4 L nasal cannula in bed   With Activity: SpO2: 95% on 4 L nasal cannula after readjusting  body in bed        Interventions Performed During Today's Session: AM-PAC: 14/24, skilled education: Role OT/POC, Importance of elevating HOB, Incentive spirometer, looking at bulletin board for date, bed mobility towards HOB          Eval Duration (OT): 16 Min.    Medical Staff Made Aware: RN     I attest that I have reviewed the above information.  Signed: Jaclyn Shaggy, OT  Filed 03/31/2018      I was physically present and immediately available to direct and supervise tasks that were related to patient management. The direction and supervision was continuous throughout the time these tasks were performed.     Jaclyn Shaggy, OT

## 2018-03-31 NOTE — Unmapped (Signed)
Patient AAOx4, afebrile, VSS. Patient saturating well on 1-1.5L [place] yet still feeling short of breath. Admitted with family at bedside after receiving blood products in clinic prior to admission. Planned for CT chest. Patient denies any pain. Will continue to monitor.

## 2018-03-31 NOTE — Unmapped (Signed)
Pulmonary Consult Service  Consult H&P      Primary Service:   Hematology / Oncology  Primary Service Attending:  Vito Berger, MD  Reason for Consult:   Hypoxemic respiratory failure, bronchoscopy evaluation     HISTORY:     History of Present Illness:  Jessica Wilson is a 77 y.o. 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     On 6/30 patient had renal stone protocol CT which captured the lower portion of the lungs, revealing an infiltrative pattern. At the time, she was asymptomatic (denies fever, cough, SOB), but shortly afterwards developed SOB and cough on 03/11/2018. She was admitted to [facility] where CT Chest revealed bilateral nodular pulmonary infiltrates, thought to be multifocal pneumonia. At presentation to [facility], she was febrile to 102, but during hospitalization there had no documented fevers. She was treated broad spectrum antibiotics and became hypoxic during admission. On 03/15/2018 she became more dyspneic and was transferred to [facility].     On arrival at [facility] patient was afebrile with saturations of 96% on RA. She was continued on broad spectrum antibiotics (cefepime, vancomycin, azithromycin) for suspect pneumonia while sputum culture was collected. Enasidenib was held 1-2 days for suspect differentiation sydnrome. With subsequent rise in WBC, patient also received hydroxurea. Patient received prednisone burst. Antibiotic therapy was narrowed to cefepime alone once cultures returned with normal oral flora, and patient finished therapy on levofloxacin. CXR showed interval improvement of airways, though [name] does not feel she ever really improved. Oxygen requirements during hospitalization peaked at 4L [place] (on admission) and were weaned to 1L [place] (maintaining O2 sats in mid 90s). She was discharged to SNF, as she continued to experience DOE and was unable to return home.     Patient states that since leaving [facility] for [facility], she has continued to feel SOB without any noticeable improvement???feeling minimally worse today vs discharge 7/15. At SNF she is kept on 1???1.5L O2 [place] and has significant DOE. She was transported to the infusion center yesterday for planned hospital follow-up with Dr. Malen Gauze (hemonc). At SNF, oxygen saturations remain in the mid 80 while off of oxygen, dropping further with activity. On presentation yesterday, she reported cough productive of clear/white sputum, now resolved. Oxygen saturation was 92% on 1???1.5L with drop to mid-80s with activity. Over last hospitalization and interim, she denies fever/chills/night sweats. Denies lightheadedness/dizziness with SOB/DOE. Reports some palpitations with symptoms, but not consistently. Talbert Forest (sister, present at interview) feels there is an anxiety component at play as well with activity.    During this time period, we also observe that [name] blast and mono counts are rising, suspicious for leukemic infiltration of the lungs.     Patient does report a diagnosis of mild COPD (10+ years ago; no pulmonary function test performed) and has one inhaler at home: Ernestina Patches which she takes as needed. She reports infrequent use of the inhaler, using only after experiencing SOB. She states she can go weeks at a time without need for the inhaler. She also has a diagnosis of OSA for which she  uses a home CPAP machine (setting of 2???3). Her last sleep study was performed many years ago, at diagnosis. She uses a nasal mask with chin strap and humidified air and has recently experienced a significant nose bleed requiring nasal balloon tamponade.     Patient is a remote smoker (quit >35 years ago, smoking less than 1ppd).    Past Medical History:  Past Medical History:   Diagnosis Date   ??? Anxiety disorder    ??? Chronic kidney disease    ??? Depression    ??? Diabetes mellitus without complication (CMS-HCC)    ??? Fibrocystic breast    ??? GERD (gastroesophageal reflux disease)    ??? Hyperlipidemia    ??? Hypertension    ??? MDS (myelodysplastic syndrome), high grade (CMS-HCC) 04/17/2016   ??? Nephrolithiasis    ??? Obesity    ??? Sleep apnea      Past Surgical History:   Procedure Laterality Date   ??? CARDIAC CATHETERIZATION     ??? CHOLECYSTECTOMY  02/24/2015   ??? DIAGNOSTIC LAPAROSCOPY      removal of benign abdominal tumor   ??? EXCISION / BIOPSY BREAST / NIPPLE / DUCT Right Age 75   ??? HYSTERECTOMY  age 75       Other History:  Family History   Problem Relation Age of Onset   ??? Heart failure Mother    ??? Diabetes Mother    ??? Coronary artery disease Mother    ??? Cirrhosis Father      Social History     Socioeconomic History   ??? Marital status: Married     Spouse name: Not on file   ??? Number of children: Not on file   ??? Years of education: Not on file   ??? Highest education level: Not on file   Occupational History   ??? Occupation: farmer     Comment: dairy farming   ??? Occupation: Airline pilot     Comment: super Games developer, retired   Social Needs   ??? Financial resource strain: Not on file   ??? Food insecurity:     Worry: Not on file     Inability: Not on file   ??? Transportation needs:     Medical: Not on file     Non-medical: Not on file   Tobacco Use   ??? Smoking status: Former Smoker     Types: Cigarettes     Last attempt to quit: 09/09/1988     Years since quitting: 29.5   ??? Smokeless tobacco: Former Neurosurgeon     Quit date: 06/05/1991   Substance and Sexual Activity   ??? Alcohol use: No   ??? Drug use: No   ??? Sexual activity: Not on file   Lifestyle   ??? Physical activity:     Days per week: Not on file     Minutes per session: Not on file   ??? Stress: Not on file   Relationships   ??? Social connections:     Talks on phone: Not on file     Gets together: Not on file     Attends religious service: Not on file     Active member of club or organization: Not on file     Attends meetings of clubs or organizations: Not on file     Relationship status: Not on file   Other Topics Concern   ??? Not on file   Social History Narrative   ??? Not on file  Home Medications:  Current Facility-Administered Medications on File Prior to Encounter   Medication Dose Route Frequency Provider Last Rate Last Dose   ??? ondansetron (ZOFRAN) 8 mg in sodium chloride (NS) 0.9 % 25 mL IVPB  8 mg Intravenous Once Doneta Public, MD         Current Outpatient Medications on File Prior to Encounter   Medication Sig Dispense Refill   ??? acetaminophen (TYLENOL) 325 MG tablet Take 650 mg by mouth every six (6) hours as needed for pain.     ??? acyclovir (ZOVIRAX) 400 MG tablet Take 400 mg by mouth Two (2) times a day.      ??? buPROPion (WELLBUTRIN SR) 150 MG 12 hr tablet Take 150 mg by mouth Two (2) times a day.     ??? chlorhexidine (PERIDEX) 0.12 % solution 15 mL by Mouth route Two (2) times a day. 473 mL 4   ??? clonazePAM (KLONOPIN) 0.5 MG tablet Take 0.5 mg by mouth two (2) times a day as needed for anxiety.      ??? diltiazem (CARDIZEM CD) 180 MG 24 hr capsule Take 180 mg by mouth daily.     ??? enasidenib (IDHIFA) 100 mg tablet Take 1 tablet (100 mg total) by mouth daily. 30 tablet 6   ??? hydroxyurea (HYDREA) 500 mg capsule Take 2 capsules (1,000 mg total) by mouth Two (2) times a day. 120 capsule 2   ??? lidocaine-prilocaine (EMLA) cream Apply 1 application topically as needed. Apply small amount to port site at least 1 hour prior to it being accessed, cover with plastic wrap     ??? ondansetron (ZOFRAN) 4 MG tablet   0   ??? oxyCODONE (ROXICODONE) 5 MG immediate release tablet TK 1 T PO Q 8 H PRN  0   ??? sertraline (ZOLOFT) 100 MG tablet Take 100 mg by mouth daily.      ??? umeclidinium-vilanterol (ANORO ELLIPTA) 62.5-25 mcg/actuation inhaler Inhale 1 puff daily.         In-Hospital Medications:  Scheduled:  ??? acyclovir  400 mg Oral BID   ??? buPROPion  150 mg Oral BID   ??? dexamethasone  4 mg Intravenous Q12H   ??? diltiazem  180 mg Oral Daily   ??? hydroxyurea  1,000 mg Oral BID   ??? sertraline  100 mg Oral Daily   ??? sodium chloride  10 mL Intravenous BID     Continuous Infusions:  ??? sodium chloride 20 mL/hr (03/30/18 1600)     PRN Medications:  cetirizine, loperamide, loperamide, prochlorperazine    Allergies:  Allergies as of 03/30/2018 - Reviewed 03/30/2018   Allergen Reaction Noted   ??? Macrobid [nitrofurantoin monohyd/m-cryst]  07/19/2017         PHYSICAL EXAM:     Patient Vitals for the past 24 hrs:   BP Temp Temp src Pulse Resp SpO2 Height Weight   03/31/18 1122 154/55 36.9 ??C (98.4 ??F) Oral 77 20 98 % ??? ???   03/31/18 0748 171/49 36.9 ??C (98.4 ??F) Oral 76 18 95 % ??? ???   03/31/18 0323 161/72 36.5 ??C (97.7 ??F) Oral 81 20 98 % ??? ???   03/30/18 2331 137/62 36.5 ??C (97.7 ??F) Oral 88 22 93 % ??? ???   03/30/18 2200 ??? ??? ??? ??? ??? 91 % ??? ???   03/30/18 1945 135/56 36.7 ??C (98.1 ??F) Oral 90 24 94 % ??? ???   03/30/18 1615 139/60 36.6 ??C (97.9 ??F) Oral 84  22 92 % 160 cm (5' 2.99) 98 kg (216 lb)   03/30/18 1525 154/67 37 ??C (98.6 ??F) Oral 85 ??? ??? ??? ???   03/30/18 1443 146/58 36.9 ??C (98.5 ??F) Oral 91 18 ??? ??? ???     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)  Mouth: Moist  Neck/ENT: Trachea supple and midline, unable to assess JVD secondary to habitus  CV: Normal sinus rhythm, no murmur, rub, or gallop appreciated  Resp: Becomes SOB with conversation; no use of respiratory accessory muscles; diffuse mild wheezing appreciated (R>L), no crackles or wheezing appreciated GI: Significant abdominal adiposity; Abdomen soft, non-tender, nondistended; no organomegaly appreciated  MSK: No pedal edema; no clubbing or cyanosis; upper/lower extremity muscle atrophy  Skin: No rash present on visible extremities  Neuro: R eye affected since birth (unknown etiology); otherwise no gross neurologic impairments appreciated    LABORATORY and RADIOLOGY DATA:     Pertinent Laboratory Data:  Lab Results   Component Value Date    WBC 20.3 (H) 03/31/2018    HGB 9.3 (L) 03/31/2018    HCT 29.0 (L) 03/31/2018    PLT 35 (L) 03/31/2018       Lab Results   Component Value Date    NA 142 03/31/2018    K 4.3 03/31/2018    CL 108 (H) 03/31/2018    CO2 22.0 03/31/2018    BUN 31 (H) 03/31/2018    CREATININE 1.24 (H) 03/31/2018    GLU 170 03/31/2018    CALCIUM 8.5 03/31/2018    MG 1.6 03/31/2018    PHOS 4.0 03/31/2018       Lab Results   Component Value Date    BILITOT 1.1 03/31/2018    BILIDIR 0.20 03/31/2018    PROT 6.5 03/31/2018    ALBUMIN 3.6 03/31/2018    ALT 14 (L) 03/31/2018    AST 17 03/31/2018    ALKPHOS 68 03/31/2018       Lab Results   Component Value Date    INR 1.23 03/15/2018    APTT 38.2 03/15/2018       Pertinent Micro Data:  Microbiology Results (last day)     ** No results found for the last 24 hours. **           Pertinent Imaging Data:  CT Chest (03/30/2018):  PERTINENT FINDINGS:     CHEST: Diffuse bilateral nodular opacities from 03/11/2018 are essentially resolved with only residual nodular opacities persisting.    New heterogeneous ground-glass opacities involving all lung lobes both centrally and peripherally.    Small, increased right pleural effusion. New right paracardiac 4.5 x 1.6 cm opacity, likely mediastinal pleural thickening (image 52, series 2).    Several mildly enlarged, but unchanged mediastinal nodes. Heart size normal. No pericardial effusion. Normal caliber thoracic aorta.      Impression       New ground-glass opacities diffusely; differential considerations include diffuse alveolar damage and differentiation syndrome (secondary to enasidenib).    Small right pleural effusion and new right-sided mediastinal pleural thickening; differential diagnosis includes leukemic infiltration.    Numerous bilateral lung nodules from 03/11/2018 CT chest are essentially resolved.         ASSESSMENT and PLAN     Jessica Wilson is a  77 y.o. woman with PMH significant for mild COPD, OSA, and AML on enasidenib therapy with recent labs indicating proliferating blasts and monocytes, and recent hospitalization for pneumonia who re-presents to [facility] with persistent SOB/DOE and hypoxemia with new imaging illustrating diffuse GGO, R pleural effusion, and R-sided paracardiac opacity concerning for leukemic infiltration vs differentiation syndrome vs atypical infection. Pulmonology was consulted for evaluation of hypoxemia with potential bronchoscopy.     Oxygen saturation on admission yesterday was 92% on 1???1.5L oxygen with desaturations to the mid 80s with activity. She did receive 2 units RBC (Hgb 7.1) and 1 unit of platelets, without significant improvement in symptoms. Given her history, degree of hypoxemia, and new imaging findings, her hypoxemia is likely driven by the interstitial lung changes vs the accumulation of pleural fluid (small effusion). Perhaps reassuringly, the nodular infiltrate found on 03/11/2018 CT have resolved with antibiotics. In speaking with the oncology team, they feel as though the infiltrates at that time may have been leukemic (given the temporal appearance with blast proliferation) and responsive to hydroxyurea.     Given [name] blast/monocyte proliferation, oncology is investigating myeloid mutations to direct future treatment options; enasidenib was stopped yesterday and the patient continues on hydroxyurea.     [name] has not had a prolonged period of severe neutropenia, so fungal infections are less likely, but not unreasonable to consider on the differential. Discussion with the primary team was held regarding the utility of bronchoscopy in [name] continued work-up. At this time, given [name] hemodynamic and respiratory status, we think it is reasonable to move forward with bronchoscopy with bronchioalveolar lavage to rule out atypical infections, and perhaps gain information through cytology and flow cytometry on the nature of the infiltrates.    We have already consented her for the procedure which will tentatively be tomorrow around noon. Kindly have a platelet goal of > 16109 prior to procedure and keep her NPO from midnight tonight.     RECOMMENDATIONS:  -- Bronchoscopy with BAL scheduled for tomorrow afternoon; tests to include cytology and flow cytometry   -NPO from midnight tonight  -platelet goal >20000 for bronchoscopy  -- Symbicort BID  -- Albuterol PRN for wheezing  -- Nocturnal CPAP with humidified air  -Thoracentesis will be considered once we have some information from the BAL as it is not driving her hypoxia at this time given its small to moderate size.      Thank you for allowing [place] to participate in this patient's care. Please do not hesitate to call the on-call pulmonary fellow at [phone] with any questions.    This patient was seen and discussed with attending physician, Dr. Haze Justin, who agrees with the assessment and plan above.      Maia Plan, MD  PGY 4, Pulmonary and Critical Care  Pager: [phone]  March 31, 2018 6:46 PM

## 2018-03-31 NOTE — Unmapped (Signed)
Daily Progress Note    Assessment/Plan:    Jessica Wilson is a 77 y.o. female who presented to [facility] with Acute and chronic respiratory failure with hypoxia (CMS-HCC) concerning for differentiation syndrome vs leukocytic infiltration vs pneumonia.    Hypoxic respiratory failure: Differential includes differentiation syndrome vs pulmonary leukocyte infiltration vs pneumonia. Pneumonia less likely given the lack of focal signs and recent history of appropriate antibiotic treatment, but fungal pneumonia still on the differetial. CT on 7/22 showed new b/l ground glass opacities and R-sided pleural effusion.  - Pulmonology consulted and will see her this afternoon. Platelets are 35, will need transfusion to >50 prior to any intervention. Platelets on hold this morning.  - Continue dexamethasone 4 mg BID  - Continue PRN nebulizers    Advanced Care Planning: Upon admission she was DNR/DNI. Given her admission for acute hypoxic respiratory failure, we discussed her code status. She said she would be amenable to intubation and transfer to the MICU if there was a reversible cause of her respiratory failure. She was clear that she would not want chest compressions if she were to have cardiac arrest.   ??  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 Malen Wilson as an outpatient as well as Dr. Donneta Wilson locally in [place]. Has been on Enasidenib since 06/2017.   - Hold Enasidenib pending myeloid mutation panel results.  - Continue hydroxyurea 1g BID  - F/u myeloid mutation panel from 7/22  - Daily BMP   - Daily CBC w/ diff  - Transfuse for Hgb <7 and platelets <10  -??Transfuse cryoprecipitate for fibrinogen <150 if bleeding or consumptive process??  - Continue acyclovir for prophylaxis  - Continue Hydroxyurea  - Will discuss with Dr. Malen Wilson regarding future chemotherapy once myeloid mutation panel results.  ___________________________________________________________________    Subjective:  - Increased dyspnea with ambulation in the room. O2 increased from 2L to 4L [place].    Labs/Studies:  Labs and Studies from the last 24hrs per EMR and Reviewed  Hgb-9.3; Plt-35       Objective:  Temp:  [36.5 ??C-37.1 ??C] 36.9 ??C  Heart Rate:  [76-94] 77  Resp:  [18-24] 20  BP: (135-171)/(49-72) 154/55  FiO2 (%):  [4 %] 4 %  SpO2:  [91 %-98 %] 98 %    Gen: Tired appearing, elderly lady lying in the bed in no acute distress  Card: Regular rate and rhythm with no murmurs, gallops, or rub  Pulm: Wheezes on the L, Decreased breath sounds on the R  Abd: Normo-active bowel sounds. Soft, non-tender, non-distended with no rebound or guarding.  Neuro: Alert and conversational, moves all extremities equally.  Ext: No edema. 2+ DP and radial pulses.

## 2018-03-31 NOTE — Unmapped (Signed)
error 

## 2018-04-01 LAB — MANUAL DIFFERENTIAL
BASOPHILS - ABS (DIFF): 0 10*9/L (ref 0.0–0.1)
BASOPHILS - REL (DIFF): 0 %
BLASTS - REL (DIFF): 4 % (ref <=0)
EOSINOPHILS - ABS (DIFF): 0 10*9/L (ref 0.0–0.4)
EOSINOPHILS - REL (DIFF): 0 %
LYMPHOCYTES - REL (DIFF): 8 %
MONOCYTES - ABS (DIFF): 18 10*9/L — ABNORMAL HIGH (ref 0.2–0.8)
MONOCYTES - REL (DIFF): 66 %
NEUTROPHILS - ABS (DIFF): 6 10*9/L (ref 2.0–7.5)
NEUTROPHILS - REL (DIFF): 22 %

## 2018-04-01 LAB — CBC W/ AUTO DIFF
HEMATOCRIT: 27.6 % — ABNORMAL LOW (ref 36.0–46.0)
HEMOGLOBIN: 8.8 g/dL — ABNORMAL LOW (ref 12.0–16.0)
MEAN CORPUSCULAR HEMOGLOBIN CONC: 31.8 g/dL (ref 31.0–37.0)
MEAN CORPUSCULAR HEMOGLOBIN: 30.2 pg (ref 26.0–34.0)
MEAN CORPUSCULAR VOLUME: 94.8 fL (ref 80.0–100.0)
MEAN PLATELET VOLUME: 7.2 fL (ref 7.0–10.0)
NUCLEATED RED BLOOD CELLS: 1 /100{WBCs} (ref <=4)
PLATELET COUNT: 21 10*9/L — ABNORMAL LOW (ref 150–440)
RED BLOOD CELL COUNT: 2.91 10*12/L — ABNORMAL LOW (ref 4.00–5.20)
RED CELL DISTRIBUTION WIDTH: 20.9 % — ABNORMAL HIGH (ref 12.0–15.0)
WBC ADJUSTED: 27.3 10*9/L — ABNORMAL HIGH (ref 4.5–11.0)

## 2018-04-01 LAB — BASIC METABOLIC PANEL
ANION GAP: 9 mmol/L (ref 9–15)
BLOOD UREA NITROGEN: 30 mg/dL — ABNORMAL HIGH (ref 7–21)
CALCIUM: 8.7 mg/dL (ref 8.5–10.2)
CHLORIDE: 108 mmol/L — ABNORMAL HIGH (ref 98–107)
CO2: 25 mmol/L (ref 22.0–30.0)
CREATININE: 1.15 mg/dL — ABNORMAL HIGH (ref 0.60–1.00)
EGFR CKD-EPI AA FEMALE: 53 mL/min/{1.73_m2} — ABNORMAL LOW (ref >=60)
EGFR CKD-EPI NON-AA FEMALE: 46 mL/min/{1.73_m2} — ABNORMAL LOW (ref >=60)
GLUCOSE RANDOM: 173 mg/dL (ref 65–179)
POTASSIUM: 4.2 mmol/L (ref 3.5–5.0)

## 2018-04-01 LAB — CALCIUM: Calcium:MCnc:Pt:Ser/Plas:Qn:: 8.7

## 2018-04-01 LAB — MAGNESIUM: Magnesium:MCnc:Pt:Ser/Plas:Qn:: 1.6

## 2018-04-01 LAB — LYMPHOCYTES - REL (DIFF): Lab: 8

## 2018-04-01 LAB — MICROCYTES

## 2018-04-01 LAB — URIC ACID: Urate:MCnc:Pt:Ser/Plas:Qn:: 8.7 — ABNORMAL HIGH

## 2018-04-01 LAB — PLATELET COUNT: Lab: 39 — ABNORMAL LOW

## 2018-04-01 LAB — PHOSPHORUS: Phosphate:MCnc:Pt:Ser/Plas:Qn:: 4.2

## 2018-04-01 NOTE — Unmapped (Signed)
Brief Operative Note    Please see Provation bronchoscopy note under procedures tab in Epic for full details of procedure.      Maia Plan, MD  PGY 4, Pulmonary and Critical Care  Pager: [phone]  April 01, 2018 1:38 PM   April 01, 2018

## 2018-04-01 NOTE — Unmapped (Signed)
Patient alert and oriented x4, afebrile and free from falls this shift.  Patient denied any pain or discomfort during this shift.  Patient remains on 4L via [place] with saturations in mid to upper 90.  Patient NPO since midnight for procedure today 7/24.  Patient currently receiving unit of platelets for procedure, tolerating it well.  Patient rested with eyes closed on and off during this shift.  No acute events during this shift.  Will continue to monitor.      Problem: Adult Inpatient Plan of Care  Goal: Plan of Care Review  Outcome: Ongoing - Unchanged  Goal: Patient-Specific Goal (Individualization)  Outcome: Ongoing - Unchanged  Goal: Absence of Hospital-Acquired Illness or Injury  Outcome: Ongoing - Unchanged  Goal: Optimal Comfort and Wellbeing  Outcome: Ongoing - Unchanged  Goal: Readiness for Transition of Care  Outcome: Ongoing - Unchanged  Goal: Rounds/Family Conference  Outcome: Ongoing - Unchanged     Problem: Self-Care Deficit  Goal: Improved Ability to Complete Activities of Daily Living  Outcome: Ongoing - Unchanged     Problem: Fall Injury Risk  Goal: Absence of Fall and Fall-Related Injury  Outcome: Ongoing - Unchanged     Problem: COPD Comorbidity  Goal: Maintenance of COPD Symptom Control  Outcome: Ongoing - Unchanged     Problem: Hypertension Comorbidity  Goal: Blood Pressure in Desired Range  Outcome: Ongoing - Unchanged     Problem: Obstructive Sleep Apnea Risk or Actual (Comorbidity Management)  Goal: Unobstructed Breathing During Sleep  Outcome: Ongoing - Unchanged     Problem: Pain Chronic (Persistent) (Comorbidity Management)  Goal: Acceptable Pain Control and Functional Ability  Outcome: Ongoing - Unchanged

## 2018-04-01 NOTE — Unmapped (Signed)
Daily Progress Note    Assessment/Plan:    Jessica Wilson is a 77 y.o. female who presented to [facility] with Acute and chronic respiratory failure with hypoxia (CMS-HCC) concerning for differentiation syndrome vs leukocytic infiltration vs pneumonia.    Hypoxic respiratory failure: Differential includes differentiation syndrome vs pulmonary leukocyte infiltration vs pneumonia. Pneumonia less likely given the lack of focal signs and recent history of appropriate antibiotic treatment, but fungal pneumonia still on the differetial. CT on 7/22 showed new b/l ground glass opacities and R-sided pleural effusion.  - BAL this afternoon.  - Discontinued dexamethasone in the setting of ground glass opacities, though we have a low suspicion for fungal pneumonia.   - Continue PRN nebulizers    Advanced Care Planning: Upon admission she was DNR/DNI. Given her admission for acute hypoxic respiratory failure, we discussed her code status. She said she would be amenable to intubation and transfer to the MICU if there was a reversible cause of her respiratory failure. She was clear that she would not want chest compressions if she were to have cardiac arrest.     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 Malen Gauze as an outpatient as well as Dr. Donneta Romberg locally in [place]. Has been on Enasidenib since 06/2017.   - Increase Hydroxyurea to 1g BID given increased monocytes this morning.   Hold Enasidenib pending myeloid mutation panel results.  - Continue hydroxyurea 1g BID  - F/u myeloid mutation panel from 7/22  - Daily BMP   - Daily CBC w/ diff  - Transfuse for Hgb <7 and platelets <10  -??Transfuse cryoprecipitate for fibrinogen <150 if bleeding or consumptive process??  - Continue acyclovir for prophylaxis  - Continue Hydroxyurea  - Will discuss with Dr. Malen Gauze regarding future chemotherapy once myeloid mutation panel results.  ___________________________________________________________________    Subjective:  - Transfused 1U platelets this morning for platelets of 21 prior to BAL.  - Breathing comfortable on 4L [place]    Labs/Studies:  Labs and Studies from the last 24hrs per EMR and Reviewed  WBC- 27.3 (20.3)  Absolute monocytes- 18 (9.7)       Objective:  Temp:  [36.7 ??C-37.3 ??C] (P) 36.8 ??C  Heart Rate:  [75-92] (P) 86  Resp:  [18-22] (P) 20  BP: (141-159)/(44-75) (P) 165/71  SpO2:  [94 %-97 %] (P) 97 %    Gen: Tired appearing, elderly lady lying in the bed in no acute distress  Card: Regular rate and rhythm with no murmurs, gallops, or rub  Pulm: Wheezes on the L, Decreased breath sounds on the R  Abd: Normo-active bowel sounds. Soft, non-tender, non-distended with no rebound or guarding.  Neuro: Alert and conversational, moves all extremities equally.  Ext: No edema. 2+ DP and radial pulses.    Harrell Gave, MD  Neurology Resident, PGY-1  April 01, 2018 11:58 AM

## 2018-04-02 LAB — BASIC METABOLIC PANEL
ANION GAP: 8 mmol/L — ABNORMAL LOW (ref 9–15)
ANION GAP: 11 mmol/L (ref 9–15)
BLOOD UREA NITROGEN: 38 mg/dL — ABNORMAL HIGH (ref 7–21)
BLOOD UREA NITROGEN: 49 mg/dL — ABNORMAL HIGH (ref 7–21)
BUN / CREAT RATIO: 24
BUN / CREAT RATIO: 28
CALCIUM: 8.2 mg/dL — ABNORMAL LOW (ref 8.5–10.2)
CALCIUM: 8.2 mg/dL — ABNORMAL LOW (ref 8.5–10.2)
CHLORIDE: 105 mmol/L (ref 98–107)
CHLORIDE: 102 mmol/L (ref 98–107)
CO2: 28 mmol/L (ref 22.0–30.0)
CO2: 25 mmol/L (ref 22.0–30.0)
CREATININE: 1.73 mg/dL — ABNORMAL HIGH (ref 0.60–1.00)
EGFR CKD-EPI AA FEMALE: 33 mL/min/{1.73_m2} — ABNORMAL LOW (ref >=60)
EGFR CKD-EPI NON-AA FEMALE: 32 mL/min/{1.73_m2} — ABNORMAL LOW (ref >=60)
EGFR CKD-EPI NON-AA FEMALE: 28 mL/min/{1.73_m2} — ABNORMAL LOW (ref >=60)
GLUCOSE RANDOM: 189 mg/dL — ABNORMAL HIGH (ref 65–179)
GLUCOSE RANDOM: 216 mg/dL — ABNORMAL HIGH (ref 65–179)
POTASSIUM: 3.9 mmol/L (ref 3.5–5.0)
SODIUM: 141 mmol/L (ref 135–145)
SODIUM: 138 mmol/L (ref 135–145)

## 2018-04-02 LAB — LACTATE DEHYDROGENASE: Lactate dehydrogenase:CCnc:Pt:Ser/Plas:Qn:: 1131 — ABNORMAL HIGH

## 2018-04-02 LAB — PHOSPHORUS
PHOSPHORUS: 5.8 mg/dL — ABNORMAL HIGH (ref 2.9–4.7)
Phosphate:MCnc:Pt:Ser/Plas:Qn:: 5.7 — ABNORMAL HIGH
Phosphate:MCnc:Pt:Ser/Plas:Qn:: 5.8 — ABNORMAL HIGH

## 2018-04-02 LAB — CBC W/ AUTO DIFF
HEMATOCRIT: 25.3 % — ABNORMAL LOW (ref 36.0–46.0)
HEMOGLOBIN: 8 g/dL — ABNORMAL LOW (ref 12.0–16.0)
MEAN CORPUSCULAR HEMOGLOBIN CONC: 31.8 g/dL (ref 31.0–37.0)
MEAN CORPUSCULAR HEMOGLOBIN: 29.9 pg (ref 26.0–34.0)
MEAN PLATELET VOLUME: 7.5 fL (ref 7.0–10.0)
NUCLEATED RED BLOOD CELLS: 1 /100{WBCs} (ref <=4)
PLATELET COUNT: 49 10*9/L — ABNORMAL LOW (ref 150–440)
RED BLOOD CELL COUNT: 2.69 10*12/L — ABNORMAL LOW (ref 4.00–5.20)
RED CELL DISTRIBUTION WIDTH: 21.3 % — ABNORMAL HIGH (ref 12.0–15.0)
WBC ADJUSTED: 16.5 10*9/L — ABNORMAL HIGH (ref 4.5–11.0)

## 2018-04-02 LAB — RED CELL DISTRIBUTION WIDTH: Lab: 21.3 — ABNORMAL HIGH

## 2018-04-02 LAB — MANUAL DIFFERENTIAL
BASOPHILS - ABS (DIFF): 0 10*9/L (ref 0.0–0.1)
BASOPHILS - REL (DIFF): 0 %
BLASTS - REL (DIFF): 18 % (ref <=0)
EOSINOPHILS - ABS (DIFF): 0 10*9/L (ref 0.0–0.4)
EOSINOPHILS - REL (DIFF): 0 %
LYMPHOCYTES - ABS (DIFF): 1.8 10*9/L (ref 1.5–5.0)
LYMPHOCYTES - REL (DIFF): 11 %
MONOCYTES - REL (DIFF): 58 %
NEUTROPHILS - ABS (DIFF): 2.1 10*9/L (ref 2.0–7.5)

## 2018-04-02 LAB — URIC ACID
Urate:MCnc:Pt:Ser/Plas:Qn:: 10.7 — ABNORMAL HIGH
Urate:MCnc:Pt:Ser/Plas:Qn:: 12.4 — ABNORMAL HIGH

## 2018-04-02 LAB — POTASSIUM: Potassium:SCnc:Pt:Ser/Plas:Qn:: 3.5

## 2018-04-02 LAB — GLUCOSE RANDOM: Glucose:MCnc:Pt:Ser/Plas:Qn:: 189 — ABNORMAL HIGH

## 2018-04-02 LAB — EOSINOPHILS - ABS (DIFF): Lab: 0

## 2018-04-02 LAB — MAGNESIUM: Magnesium:MCnc:Pt:Ser/Plas:Qn:: 1.4 — ABNORMAL LOW

## 2018-04-02 NOTE — Unmapped (Signed)
Pt stable, medications administered as ordered. Pt VS stable.  Pt currently sleeping in bed. Call light in reach. Will continue to monitor.     Problem: Adult Inpatient Plan of Care  Goal: Plan of Care Review  Outcome: Progressing  Goal: Patient-Specific Goal (Individualization)  Outcome: Progressing  Goal: Absence of Hospital-Acquired Illness or Injury  Outcome: Progressing  Goal: Optimal Comfort and Wellbeing  Outcome: Progressing     Problem: Fall Injury Risk  Goal: Absence of Fall and Fall-Related Injury  Outcome: Progressing

## 2018-04-02 NOTE — Unmapped (Signed)
Pulmonary Consult Service  Follow-Up Note      Primary Service:   Hematology / Oncology  Primary Service Attending:  Vito Berger, MD  Reason for Consult:   Hypoxemic respiratory failure      SUBJECTIVE:     --Patient s/p bronchoscopy with BAL yesterday afternoon; briefly on 6L [place] post procedure with quick weaning to 4L, now stable  --Scheduled Symibort BID and albuterol PRN initiated; patient reports improved breathing with treatments  --Continues to experience cough productive of white sputum  --Lasix initiated yesterday post-bronch with good response; Volume status: -1L yesterday; patient is NOT using bedside commode, as she experiences significant dyspnea with activity; currently urinating on bed pads  --On 4L O2 with O2 saturations 89???100% in last 24 hours; RR 19???25 since bronchoscopy  --Denies chest pain, dizziness/lightheadedness, visual changes, lower extremity swelling/discomfort, hemoptysis     Medications:     Scheduled Meds:  ??? acyclovir  400 mg Oral BID   ??? albuterol  2.5 mg Nebulization 4x Daily (RT)   ??? allopurinol  100 mg Oral Daily   ??? budesonide-formoterol  2 puff Inhalation BID (RT)   ??? buPROPion  150 mg Oral BID   ??? diltiazem  180 mg Oral Daily   ??? hydroxyurea  2,000 mg Oral BID   ??? sertraline  100 mg Oral Daily   ??? sodium chloride  10 mL Intravenous BID     Continuous Infusions:  ??? sodium chloride 20 mL/hr (03/30/18 1600)     PRN Meds:.acetaminophen, albuterol, cetirizine, loperamide, loperamide, phenol, prochlorperazine     PHYSICAL EXAM:     Patient Vitals for the past 24 hrs:   BP Temp Temp src Pulse Resp SpO2 Weight   04/02/18 0724 153/56 36.9 ??C (98.4 ??F) Oral 102 22 96 % ???   04/02/18 0400 137/62 36.8 ??C (98.2 ??F) Oral 92 24 94 % ???   04/01/18 2332 159/59 36.7 ??C (98.1 ??F) Oral 98 26 92 % ???   04/01/18 2030 127/62 36.7 ??C (98.1 ??F) Oral 94 22 96 % ???   04/01/18 1530 ??? ??? ??? 92 22 97 % ???   04/01/18 1528 140/53 36.8 ??C (98.2 ??F) Oral 100 22 98 % 98.3 kg (216 lb 11.4 oz)   04/01/18 1515 ??? 36.9 ??C (98.4 ??F) Oral 98 22 97 % ???   04/01/18 1455 122/53 36.8 ??C (98.2 ??F) Oral 99 24 98 % ???   04/01/18 1430 107/63 ??? ??? 101 18 94 % ???   04/01/18 1425 ??? ??? ??? 101 24 94 % ???   04/01/18 1420 ??? ??? ??? 107 19 93 % ???   04/01/18 1415 106/63 ??? ??? 103 25 92 % ???   04/01/18 1410 ??? ??? ??? 105 19 93 % ???   04/01/18 1405 ??? ??? ??? 104 15 93 % ???   04/01/18 1400 121/45 ??? ??? 108 22 93 % ???   04/01/18 1357 ??? ??? ??? 108 20 90 % ???   04/01/18 1355 ??? ??? ??? 109 26 (!) 89 % ???   04/01/18 1350 ??? ??? ??? 112 25 90 % ???   04/01/18 1345 125/57 ??? ??? 115 29 98 % ???   04/01/18 1340 ??? ??? ??? 114 23 97 % ???   04/01/18 1335 132/73 ??? ??? 121 26 97 % ???   04/01/18 1330 ??? ??? ??? 120 23 97 % ???   04/01/18 1325 110/97 ??? ??? 118 23 97 % ???   04/01/18  1320 171/75 ??? ??? 124 21 96 % ???   04/01/18 1317 151/106 ??? ??? 126 23 99 % ???   04/01/18 1315 157/139 ??? ??? 110 23 99 % ???   04/01/18 1310 176/99 ??? ??? 111 28 100 % ???   04/01/18 1305 149/49 ??? ??? 94 20 99 % ???   04/01/18 1300 117/71 ??? ??? 93 21 99 % ???   04/01/18 1255 150/71 ??? ??? 93 22 92 % ???   04/01/18 1250 147/59 ??? ??? 87 20 99 % ???   04/01/18 1245 165/87 ??? ??? 93 23 94 % ???   04/01/18 1240 158/81 ??? ??? 95 20 96 % ???   04/01/18 1230 154/71 ??? ??? 90 20 99 % ???   04/01/18 1215 145/96 ??? ??? 95 27 97 % ???   04/01/18 1210 140/70 ??? ??? 95 16 100 % ???   04/01/18 1205 172/71 ??? ??? 96 23 96 % ???   04/01/18 1200 152/93 ??? ??? 93 20 92 % ???   04/01/18 1118 165/71 36.8 ??C (98.2 ??F) Oral 86 20 97 % ???   04/01/18 1028 152/67 36.9 ??C (98.4 ??F) Oral 85 20 95 % ???   04/01/18 1023 156/66 36.8 ??C (98.2 ??F) Oral 92 20 95 % ???     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)  Mouth: Moist  Neck/ENT: Trachea supple and midline, unable to assess JVD secondary to habitus  CV: Sinus tachycardia, no murmur, rub, or gallop appreciated  Resp: Improved SOB with conversation; respiratory abdominal rounding present; minimal bilateral wheezing appreciated, no crackles or rhonchi appreciated  GI: Significant abdominal adiposity; Abdomen soft, non-tender, nondistended; no organomegaly appreciated  MSK: No pedal edema; no clubbing or cyanosis; upper/lower extremity muscle atrophy  Skin: No rash present on visible extremities   Neuro: R eye affected since birth (unknown etiology); otherwise no gross neurologic impairments appreciated    LABORATORY and RADIOLOGY DATA:     Pertinent Laboratory Data from the last 24 hours:  Lab Results   Component Value Date    WBC 16.5 (H) 04/02/2018    HGB 8.0 (L) 04/02/2018    HCT 25.3 (L) 04/02/2018    PLT 49 (L) 04/02/2018     Lab Results   Component Value Date    NA 141 04/02/2018    K 3.9 04/02/2018    CL 105 04/02/2018    CO2 28.0 04/02/2018    BUN 38 (H) 04/02/2018    CREATININE 1.57 (H) 04/02/2018    GLU 189 (H) 04/02/2018    CALCIUM 8.2 (L) 04/02/2018    MG 1.6 04/01/2018    PHOS 5.7 (H) 04/02/2018       Lab Results   Component Value Date    BILITOT 1.1 03/31/2018    BILIDIR 0.20 03/31/2018    PROT 6.5 03/31/2018    ALBUMIN 3.6 03/31/2018    ALT 14 (L) 03/31/2018    AST 17 03/31/2018    ALKPHOS 68 03/31/2018       Lab Results   Component Value Date    INR 1.23 03/15/2018    APTT 38.2 03/15/2018       Pertinent Micro Data:  BAL specimen collected 04/01/2018 with the following orders: Legionella Cx, Bronch Cx, AFB smear, AFB Cx, RVP, Fungal Cx, Pneumocystis DFA, Asperguillus Galactomannan  Ag, Hematopathology/Leukemia/Lymphoma Flow Cytometry, Body Fluid pathologist review, Complete Lymphocytic Markers, Body Fluid Cell Count, Hematopathology Order, Cytology (BAL)     RVP: negative  Body Fluid Cell count:   Status:  Edited Result - FINAL ????Visible to patient:  No (Not Released) Next appt:  None     Ref Range & Units 04/01/18 1329    Fluid Type  Lavage, Bronchial     Color, Fluid  Pink     Appearance, Fluid  Cloudy     Nucleated Cells, Fluid Undefined ul 153     RBC, Fluid ul 4,825     Neutrophil %, Fluid % 7.0 Lymphocytes %, Fluid % 3.0     Mono/Macro % , Fluid % 85.0     Other Cells %, Fluid % 5.0     #Cells Counted BF Diff  100     Fluid Comments     Comment: Macrophages present.   Macrophages with inclusions present.   TISSUE CELLS PRESENT.   Degenerating cells present.          Bronch Cx: with NGTD      Pertinent Imaging Data from the last 24 hours:  CT Chest (03/30/2018):       PERTINENT FINDINGS:     CHEST: Diffuse bilateral nodular opacities from 03/11/2018 are essentially resolved with only residual nodular opacities persisting.    New heterogeneous ground-glass opacities involving all lung lobes both centrally and peripherally.    Small, increased right pleural effusion. New right paracardiac 4.5 x 1.6 cm opacity, likely mediastinal pleural thickening (image 52, series 2).    Several mildly enlarged, but unchanged mediastinal nodes. Heart size normal. No pericardial effusion. Normal caliber thoracic aorta.   ??   Impression ?? ??     New ground-glass opacities diffusely; differential considerations include diffuse alveolar damage and differentiation syndrome (secondary to enasidenib).    Small right pleural effusion and new right-sided mediastinal pleural thickening; differential diagnosis includes leukemic infiltration.    Numerous bilateral lung nodules from 03/11/2018 CT chest are essentially resolved.   ??      ASSESSMENT and PLAN     Jessica Wilson is a 77 y.o. woman with PMH significant for mild COPD, OSA, and AML on enasidenib therapy with recent labs indicating proliferating blasts and monocytes, and recent hospitalization for pneumonia who re-presents to [facility] with persistent SOB/DOE and hypoxemia with new imaging illustrating diffuse GGO, R pleural effusion, and R-sided paracardiac opacity concerning for leukemic infiltration vs differentiation syndrome vs atypical infection. Pulmonology was for bronchoscopy w/ BAL.   ??  Given her history, degree of hypoxemia, and new imaging findings, her hypoxemia is likely driven by the interstitial lung changes vs the accumulation of pleural fluid (small effusion). Perhaps reassuringly, the nodular infiltrate found on 03/11/2018 CT have resolved with antibiotics. In speaking with the oncology team, they feel as though the nodules at that time may have been leukemic (given the temporal appearance with blast proliferation) and actually resolved in response to hydroxyurea.   ??  Given [name] blast/monocyte proliferation, oncology is investigating myeloid mutations to direct future treatment options; enasidenib was stopped 03/30/2018 and the patient continues on hydroxyurea.   ??  [name] has not had a prolonged period of severe neutropenia, so fungal infections are less likely, but not unreasonable to consider on the differential. Bronchoscopy was performed 04/01/2018, and we continue to follow results of pending tests. Respiratory viral panel is negative. We are hopeful results of testing will elucidate the  nature of pulmonary infiltrates.     Today, [name] is clinically improved. Stable of 4L O2 and breathing comfortably at rest. She is able to hold a conversation without becoming overtly dyspneic, though is avoiding activity secondary to severe dyspnea. On physical exam, she continues to have minimal???mild residual wheezing post-inhaler treatments, though subjectively reports good response.  ??  ??  RECOMMENDATIONS:  -- Will continue to follow BAL testing results  -- Continue COPD management: Symbicort BID  -- Would schedule duonebs every 4 hours while awake  -- Encourage use of home CPAP with humidified air QHS  -- Thoracentesis may be considered once we have some information from the BAL, as it is not driving her hypoxia at this time given its small to moderate size  ??  Thank you for allowing [place] to participate in this patient's care. Please do not hesitate to call the on-call pulmonary fellow at [phone] with any questions.  ??  This patient was seen and discussed with attending physician, Dr. Haze Justin, who agrees with the assessment and plan above.    I attest that I have reviewed the student note and that the components of the history of the present illness, the physical exam, and the assessment and plan documented were performed by me or were performed in my presence by the student where I verified the documentation and performed (or re-performed) the exam and medical decision making. Jessica Carl, MD

## 2018-04-02 NOTE — Unmapped (Signed)
Daily Progress Note    Assessment/Plan:    Jessica Wilson is a 77 y.o. female who presented to [facility] with Acute and chronic respiratory failure with hypoxia (CMS-HCC) concerning for differentiation syndrome vs leukocytic infiltration vs pneumonia.    Hypoxic respiratory failure: Differential includes differentiation syndrome vs pulmonary leukocyte infiltration vs pneumonia. Pneumonia less likely given the lack of focal signs and recent history of appropriate antibiotic treatment, but fungal pneumonia still on the differetial. CT on 7/22 showed new b/l ground glass opacities and R-sided pleural effusion. VRP negative.   - F/u BAL leukemia panel and cx's  - Continue albuterol nebulizers PRN  - Continue Symbicort inhaler BID    Advanced Care Planning: Upon admission she was DNR/DNI. Given her admission for acute hypoxic respiratory failure, we discussed her code status. She said she would be amenable to intubation and transfer to the MICU if there was a reversible cause of her respiratory failure. She was clear that she would not want chest compressions if she were to have cardiac arrest.     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 Malen Gauze as an outpatient as well as Dr. Donneta Romberg locally in [place]. Has been on Enasidenib since 06/2017.   - 300mg  allopurinol daily.  - F/u 1300 BMP/TLS labs  - Continue hydroxyurea 2g BID  - Hold Enasidenib pending myeloid mutation panel results.  - F/u myeloid mutation panel from 7/22  - Daily BMP   - Daily CBC w/ diff  - Transfuse for Hgb <7 and platelets <10  -??Transfuse cryoprecipitate for fibrinogen <150 if bleeding or consumptive process??  - Continue acyclovir for prophylaxis  - Will discuss with Dr. Malen Gauze regarding future chemotherapy once myeloid mutation panel results.  ___________________________________________________________________    Subjective:  - Breathing more comfortable on 4L, able to speak in full sentences rather than 2-3 words at a time Labs/Studies:  Labs and Studies from the last 24hrs per EMR and Reviewed  WBC- 27.3 (20.3)  Absolute monocytes- 18 (9.7)       Objective:  Temp:  [36.7 ??C-36.9 ??C] 36.8 ??C  Heart Rate:  [87-126] 111  Resp:  [15-29] 24  BP: (106-176)/(45-139) 141/63  FiO2 (%):  [40 %-100 %] 100 %  SpO2:  [89 %-100 %] 92 %    Gen: Tired appearing, elderly lady lying in the bed in no acute distress  Card: Regular rate and rhythm with no murmurs, gallops, or rub  Pulm: B/L expiratory wheezes, decreased breath sounds on the R  Abd: Normo-active bowel sounds. Soft, non-tender, non-distended with no rebound or guarding.  Neuro: Alert and conversational, moves all extremities equally.  Ext: No edema. 2+ DP and radial pulses.    Harrell Gave, MD  Neurology Resident, PGY-1  April 02, 2018 11:37 AM

## 2018-04-02 NOTE — Unmapped (Signed)
Significant DOE but pulse ox >93 on 4LPM, slightly tachypneic at 20-24RR this shift.  Pt returned from bronchoscopy with 6LPM requirement for 30 minutes and was successfully weaned back to 4LPM within one hour.  IV lasix given for pulmonary edema and pt started on pure wick overnight.  Afebrile, VS w/in pt's BL, will CTM.

## 2018-04-03 LAB — BASIC METABOLIC PANEL
ANION GAP: 14 mmol/L (ref 9–15)
BLOOD UREA NITROGEN: 53 mg/dL — ABNORMAL HIGH (ref 7–21)
BUN / CREAT RATIO: 32
CHLORIDE: 102 mmol/L (ref 98–107)
CO2: 24 mmol/L (ref 22.0–30.0)
CREATININE: 1.68 mg/dL — ABNORMAL HIGH (ref 0.60–1.00)
EGFR CKD-EPI AA FEMALE: 34 mL/min/{1.73_m2} — ABNORMAL LOW (ref >=60)
EGFR CKD-EPI NON-AA FEMALE: 29 mL/min/{1.73_m2} — ABNORMAL LOW (ref >=60)
GLUCOSE RANDOM: 198 mg/dL — ABNORMAL HIGH (ref 65–179)
POTASSIUM: 3.4 mmol/L — ABNORMAL LOW (ref 3.5–5.0)
SODIUM: 140 mmol/L (ref 135–145)

## 2018-04-03 LAB — CBC W/ AUTO DIFF
HEMATOCRIT: 24.7 % — ABNORMAL LOW (ref 36.0–46.0)
HEMOGLOBIN: 8 g/dL — ABNORMAL LOW (ref 12.0–16.0)
MEAN CORPUSCULAR HEMOGLOBIN CONC: 32.3 g/dL (ref 31.0–37.0)
MEAN CORPUSCULAR HEMOGLOBIN: 30.5 pg (ref 26.0–34.0)
MEAN CORPUSCULAR VOLUME: 94.6 fL (ref 80.0–100.0)
MEAN PLATELET VOLUME: 7.8 fL (ref 7.0–10.0)
RED BLOOD CELL COUNT: 2.61 10*12/L — ABNORMAL LOW (ref 4.00–5.20)
RED CELL DISTRIBUTION WIDTH: 21.1 % — ABNORMAL HIGH (ref 12.0–15.0)
WBC ADJUSTED: 18 10*9/L — ABNORMAL HIGH (ref 4.5–11.0)

## 2018-04-03 LAB — MANUAL DIFFERENTIAL
BASOPHILS - ABS (DIFF): 0 10*9/L (ref 0.0–0.1)
BASOPHILS - REL (DIFF): 0 %
BLASTS - REL (DIFF): 7 % (ref <=0)
EOSINOPHILS - ABS (DIFF): 0 10*9/L (ref 0.0–0.4)
EOSINOPHILS - REL (DIFF): 0 %
LYMPHOCYTES - ABS (DIFF): 1.6 10*9/L (ref 1.5–5.0)
MONOCYTES - ABS (DIFF): 13.3 10*9/L — ABNORMAL HIGH (ref 0.2–0.8)
MONOCYTES - REL (DIFF): 74 %
NEUTROPHILS - ABS (DIFF): 1.8 10*9/L — ABNORMAL LOW (ref 2.0–7.5)
NEUTROPHILS - REL (DIFF): 10 %

## 2018-04-03 LAB — MEAN CORPUSCULAR HEMOGLOBIN CONC: Lab: 32.3

## 2018-04-03 LAB — URIC ACID: Urate:MCnc:Pt:Ser/Plas:Qn:: 12.2 — ABNORMAL HIGH

## 2018-04-03 LAB — PHOSPHORUS: Phosphate:MCnc:Pt:Ser/Plas:Qn:: 6 — ABNORMAL HIGH

## 2018-04-03 LAB — BASOPHILS - REL (DIFF): Lab: 0

## 2018-04-03 LAB — MAGNESIUM: Magnesium:MCnc:Pt:Ser/Plas:Qn:: 1.6

## 2018-04-03 LAB — SODIUM: Sodium:SCnc:Pt:Ser/Plas:Qn:: 140

## 2018-04-03 NOTE — Unmapped (Signed)
Daily Progress Note    Assessment/Plan:    Jessica Wilson is a 77 y.o. female who presented to [facility] with Acute and chronic respiratory failure with hypoxia (CMS-HCC) concerning for differentiation syndrome vs leukocytic infiltration vs pneumonia.    Hypoxic respiratory failure: Differential includes differentiation syndrome vs pulmonary leukocyte infiltration vs pneumonia. Pneumonia less likely given the lack of focal signs and recent history of appropriate antibiotic treatment, but fungal pneumonia still on the differetial. CT on 7/22 showed new b/l ground glass opacities and R-sided pleural effusion. VRP negative.   - F/u BAL leukemia panel and cx's  - Scheduled albuterol nebulizers  - Continue Symbicort inhaler BID    Advanced Care Planning: Upon admission she was DNR/DNI. Given her admission for acute hypoxic respiratory failure, we discussed her code status. She said she would be amenable to intubation and transfer to the MICU if there was a reversible cause of her respiratory failure. She was clear that she would not want chest compressions if she were to have cardiac arrest.     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 Malen Gauze as an outpatient as well as Dr. Donneta Romberg locally in [place]. Has been on Enasidenib since 06/2017.   - 300mg  allopurinol BID.  - Continue hydroxyurea 2g BID  - Hold Enasidenib pending myeloid mutation panel results.  - F/u myeloid mutation panel from 7/22  - Daily BMP   - Daily CBC w/ diff  - Transfuse for Hgb <7 and platelets <10  -??Transfuse cryoprecipitate for fibrinogen <150 if bleeding or consumptive process??  - Continue acyclovir for prophylaxis  - Will discuss with Dr. Malen Gauze regarding future chemotherapy once myeloid mutation panel results.    Gout flare: Elevated uric acid (max=12.4) likely related to TLS with new L great toe pain.  - 300 mg allopurinol BID as above  - Prednisone 20mg  BID x5 days  ___________________________________________________________________ Subjective:  - Has L great toe pain this morning.    Labs/Studies:  Labs and Studies from the last 24hrs per EMR and Reviewed   Uric acid: 12.2       Objective:  Temp:  [36.7 ??C-37.1 ??C] 36.9 ??C  Heart Rate:  [97-111] 106  Resp:  [22-28] 26  BP: (115-153)/(49-81) 151/49  SpO2:  [92 %-95 %] 93 %    Gen: Tired appearing, elderly lady lying in the bed in no acute distress  Card: Regular rate and rhythm with no murmurs, gallops, or rub  Pulm: B/L expiratory wheezes, decreased breath sounds on the R  Abd: Normo-active bowel sounds. Soft, non-tender, non-distended with no rebound or guarding.  Neuro: Alert and conversational, moves all extremities equally.  Ext: No edema. 2+ DP and radial pulses.    Harrell Gave, MD  Neurology Resident, PGY-1  April 03, 2018 1:23 PM

## 2018-04-03 NOTE — Unmapped (Signed)
Pt alert and oriented. Significant DOE, but pulse ox >93 on 4L O2, slightly tachypneic at 20-24RR this shift., other VSS, temp trending upwards.  Pt reports some soreness from bronchoscopy, given Tylenol with relief. Pt offered Nebs when tachypnea assessed, refuses. Pt has adequate output per NA. Pt has no falls or other acute events.

## 2018-04-03 NOTE — Unmapped (Signed)
[name] is A&Ox4, VSS, satting 93% on 4L [place]. Dyspnea on exertion, some dyspnea at rest. Scheduled nebulizers. Up to bedside commode with assist x1. Family at bedside.

## 2018-04-04 DIAGNOSIS — J9621 Acute and chronic respiratory failure with hypoxia: Principal | ICD-10-CM

## 2018-04-04 LAB — MANUAL DIFFERENTIAL
BASOPHILS - ABS (DIFF): 0 10*9/L (ref 0.0–0.1)
BASOPHILS - REL (DIFF): 0 %
EOSINOPHILS - REL (DIFF): 1 %
LYMPHOCYTES - ABS (DIFF): 1.9 10*9/L (ref 1.5–5.0)
LYMPHOCYTES - REL (DIFF): 15 %
MONOCYTES - ABS (DIFF): 6.1 10*9/L — ABNORMAL HIGH (ref 0.2–0.8)
MONOCYTES - REL (DIFF): 47 %
NEUTROPHILS - REL (DIFF): 32 %

## 2018-04-04 LAB — EOSINOPHILS - REL (DIFF): Lab: 1

## 2018-04-04 LAB — BASIC METABOLIC PANEL
ANION GAP: 11 mmol/L (ref 9–15)
ANION GAP: 10 mmol/L (ref 9–15)
BLOOD UREA NITROGEN: 55 mg/dL — ABNORMAL HIGH (ref 7–21)
BLOOD UREA NITROGEN: 66 mg/dL — ABNORMAL HIGH (ref 7–21)
BUN / CREAT RATIO: 36
BUN / CREAT RATIO: 40
CALCIUM: 8.6 mg/dL (ref 8.5–10.2)
CHLORIDE: 102 mmol/L (ref 98–107)
CHLORIDE: 101 mmol/L (ref 98–107)
CO2: 25 mmol/L (ref 22.0–30.0)
CO2: 23 mmol/L (ref 22.0–30.0)
CREATININE: 1.53 mg/dL — ABNORMAL HIGH (ref 0.60–1.00)
CREATININE: 1.67 mg/dL — ABNORMAL HIGH (ref 0.60–1.00)
EGFR CKD-EPI AA FEMALE: 38 mL/min/{1.73_m2} — ABNORMAL LOW (ref >=60)
EGFR CKD-EPI AA FEMALE: 34 mL/min/{1.73_m2} — ABNORMAL LOW (ref >=60)
EGFR CKD-EPI NON-AA FEMALE: 33 mL/min/{1.73_m2} — ABNORMAL LOW (ref >=60)
EGFR CKD-EPI NON-AA FEMALE: 30 mL/min/{1.73_m2} — ABNORMAL LOW (ref >=60)
GLUCOSE RANDOM: 252 mg/dL — ABNORMAL HIGH (ref 65–179)
GLUCOSE RANDOM: 410 mg/dL (ref 65–179)
POTASSIUM: 4.5 mmol/L (ref 3.5–5.0)
POTASSIUM: 4.6 mmol/L (ref 3.5–5.0)
SODIUM: 138 mmol/L (ref 135–145)
SODIUM: 134 mmol/L — ABNORMAL LOW (ref 135–145)

## 2018-04-04 LAB — CBC W/ AUTO DIFF
HEMOGLOBIN: 8.4 g/dL — ABNORMAL LOW (ref 12.0–16.0)
MEAN CORPUSCULAR HEMOGLOBIN CONC: 32.4 g/dL (ref 31.0–37.0)
MEAN CORPUSCULAR HEMOGLOBIN: 30.7 pg (ref 26.0–34.0)
MEAN CORPUSCULAR VOLUME: 94.7 fL (ref 80.0–100.0)
MEAN PLATELET VOLUME: 8.1 fL (ref 7.0–10.0)
PLATELET COUNT: 25 10*9/L — ABNORMAL LOW (ref 150–440)
RED BLOOD CELL COUNT: 2.72 10*12/L — ABNORMAL LOW (ref 4.00–5.20)
RED CELL DISTRIBUTION WIDTH: 21.1 % — ABNORMAL HIGH (ref 12.0–15.0)
WBC ADJUSTED: 12.9 10*9/L — ABNORMAL HIGH (ref 4.5–11.0)

## 2018-04-04 LAB — URIC ACID: Urate:MCnc:Pt:Ser/Plas:Qn:: 10.7 — ABNORMAL HIGH

## 2018-04-04 LAB — SODIUM: Sodium:SCnc:Pt:Ser/Plas:Qn:: 138

## 2018-04-04 LAB — BLOOD GAS, VENOUS
BASE EXCESS VENOUS: -1.3 (ref -2.0–2.0)
HCO3 VENOUS: 24 mmol/L (ref 22–27)
O2 SATURATION VENOUS: 65.4 % (ref 40.0–85.0)
PH VENOUS: 7.35 (ref 7.32–7.43)
PO2 VENOUS: 35 mmHg (ref 30–55)

## 2018-04-04 LAB — PRO-BNP: Natriuretic peptide.B prohormone N-Terminal:MCnc:Pt:Ser/Plas:Qn:: 2050 — ABNORMAL HIGH

## 2018-04-04 LAB — LACTATE BLOOD VENOUS: Lactate:SCnc:Pt:BldV:Qn:: 1.9 — ABNORMAL HIGH

## 2018-04-04 LAB — PHOSPHORUS
Phosphate:MCnc:Pt:Ser/Plas:Qn:: 5 — ABNORMAL HIGH
Phosphate:MCnc:Pt:Ser/Plas:Qn:: 5.6 — ABNORMAL HIGH

## 2018-04-04 LAB — EGFR CKD-EPI NON-AA FEMALE: Lab: 30 — ABNORMAL LOW

## 2018-04-04 LAB — MEAN CORPUSCULAR HEMOGLOBIN: Lab: 30.7

## 2018-04-04 LAB — SPECIMEN SOURCE

## 2018-04-04 LAB — MAGNESIUM: Magnesium:MCnc:Pt:Ser/Plas:Qn:: 1.7

## 2018-04-04 NOTE — Unmapped (Signed)
Patient A&O; VSS-afebrile. No c/o pain, n/v. Patient continues on scheduled nebulizer treatments. Inspiratory and expiratory wheezes noted upon auscultation. Patient on remains on 4L of O2 via nasal cannula with adequate O2 sats. SNF placement pending. No acute changes or events noted. Patient safety maintained. Will continue to monitor.       Problem: Adult Inpatient Plan of Care  Goal: Plan of Care Review  Outcome: Ongoing - Unchanged  Goal: Patient-Specific Goal (Individualization)  Outcome: Ongoing - Unchanged  Goal: Absence of Hospital-Acquired Illness or Injury  Outcome: Ongoing - Unchanged  Goal: Optimal Comfort and Wellbeing  Outcome: Ongoing - Unchanged  Goal: Readiness for Transition of Care  Outcome: Ongoing - Unchanged  Goal: Rounds/Family Conference  Outcome: Ongoing - Unchanged

## 2018-04-04 NOTE — Unmapped (Signed)
Pt alert and oriented x4; although speaking somewhat altered later in shift (please see note). Pt has been tachypneic (RR in high 20s to low 30s) with audible wheezes. Pt with no c/o pain or nausea. Pt with no BM this shift and fair urine output. No new skin breakdown this shift. No s/s infection this shift. Fall precautions and pt safety maintained. Pt given a shower, CHG wipes done, and had good oral care performed. Please see note about pt's afternoon episode. Pt's sister and husband have been here. WCTM.

## 2018-04-04 NOTE — Unmapped (Signed)
Daily Progress Note    Assessment/Plan:    Jessica Wilson is a 77 y.o. female who presented to [facility] with Acute and chronic respiratory failure with hypoxia (CMS-HCC) concerning for differentiation syndrome vs leukocytic infiltration vs pneumonia.    Hypoxic respiratory failure: Differential includes differentiation syndrome vs pulmonary leukocyte infiltration vs pneumonia. Pneumonia less likely given the lack of focal signs and recent history of appropriate antibiotic treatment, but fungal pneumonia still on the differetial. CT on 7/22 showed new b/l ground glass opacities and R-sided pleural effusion. VRP negative.   - F/u BAL leukemia panel and cx's  - Scheduled albuterol nebulizers  - Continue Symbicort inhaler BID  - Discontinue steroids. She has had worsening respiratory symptoms since starting yesterday.  - 40 IV lasix  - Start posaconazole. 300mg  BID for 2 days and 300mg  daily. I have low suspicion for fungal pneumonia, but am concerned symptoms worsened with steroids.     Advanced Care Planning: Upon admission she was DNR/DNI. Given her admission for acute hypoxic respiratory failure, we discussed her code status. She said she would be amenable to intubation and transfer to the MICU if there was a reversible cause of her respiratory failure. She was clear that she would not want chest compressions if she were to have cardiac arrest.     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 Malen Gauze as an outpatient as well as Dr. Donneta Romberg locally in [place]. Has been on Enasidenib since 06/2017. FLT3 negative.    - 300mg  allopurinol BID.  - Continue hydroxyurea 2g BID  - Hold Enasidenib.  - Daily BMP   - Daily CBC w/ diff  - Transfuse for Hgb <7 and platelets <10  -??Transfuse cryoprecipitate for fibrinogen <150 if bleeding or consumptive process??  - Continue acyclovir for prophylaxis  - Will discuss with Dr. Malen Gauze regarding future chemotherapy once myeloid mutation panel results.    Gout flare: Elevated uric acid (max=12.4) likely related to TLS with new L great toe pain.  - 300 mg allopurinol BID as above  - Prednisone 20mg  BID x5 days  ___________________________________________________________________    Subjective:  - Had increased cough since yesterday. This afternoon she has had increased dyspnea and wheezing with confusion. VBG showed normal CO2 and symptoms improved with neb treatment.    Labs/Studies:  Labs and Studies from the last 24hrs per EMR and Reviewed   Uric acid: 10.7 (down from 12.2)       Objective:  Temp:  [36.8 ??C-37.1 ??C] 37 ??C  Heart Rate:  [108-113] 111  Resp:  [20-30] 30  BP: (129-173)/(49-70) 153/49  SpO2:  [91 %-94 %] 91 %    Gen: Tired appearing, elderly lady sitting in the chair increased work of breathing and with respiratory distress  Card: Regular rate and rhythm with no murmurs, gallops, or rub  Pulm: B/L expiratory wheezes heard during interview, speaking in 2-3 word sentences, decreased breath sounds on the R  Abd: Normo-active bowel sounds. Soft, non-tender, non-distended with no rebound or guarding.  Neuro: Alert and conversational, moves all extremities equally.  Ext: No edema. 2+ DP and radial pulses.    Harrell Gave, MD  Neurology Resident, PGY-1  April 04, 2018 2:41 PM

## 2018-04-05 DIAGNOSIS — J9621 Acute and chronic respiratory failure with hypoxia: Principal | ICD-10-CM

## 2018-04-05 LAB — CBC W/ AUTO DIFF
HEMATOCRIT: 19.5 % — ABNORMAL LOW (ref 36.0–46.0)
HEMOGLOBIN: 7.8 g/dL — ABNORMAL LOW (ref 12.0–16.0)
HEMOGLOBIN: 6.3 g/dL — ABNORMAL LOW (ref 12.0–16.0)
MEAN CORPUSCULAR HEMOGLOBIN CONC: 32.1 g/dL (ref 31.0–37.0)
MEAN CORPUSCULAR HEMOGLOBIN CONC: 32.2 g/dL (ref 31.0–37.0)
MEAN CORPUSCULAR HEMOGLOBIN: 30.7 pg (ref 26.0–34.0)
MEAN CORPUSCULAR VOLUME: 96 fL (ref 80.0–100.0)
MEAN CORPUSCULAR VOLUME: 95.3 fL (ref 80.0–100.0)
MEAN PLATELET VOLUME: 8.2 fL (ref 7.0–10.0)
MEAN PLATELET VOLUME: 8.1 fL (ref 7.0–10.0)
RED BLOOD CELL COUNT: 2.53 10*12/L — ABNORMAL LOW (ref 4.00–5.20)
RED CELL DISTRIBUTION WIDTH: 20.8 % — ABNORMAL HIGH (ref 12.0–15.0)
RED CELL DISTRIBUTION WIDTH: 21 % — ABNORMAL HIGH (ref 12.0–15.0)
WBC ADJUSTED: 12.5 10*9/L — ABNORMAL HIGH (ref 4.5–11.0)
WBC ADJUSTED: 7.8 10*9/L (ref 4.5–11.0)

## 2018-04-05 LAB — BASIC METABOLIC PANEL
ANION GAP: 12 mmol/L (ref 9–15)
BLOOD UREA NITROGEN: 74 mg/dL — ABNORMAL HIGH (ref 7–21)
BUN / CREAT RATIO: 37
CO2: 24 mmol/L (ref 22.0–30.0)
CREATININE: 2.01 mg/dL — ABNORMAL HIGH (ref 0.60–1.00)
EGFR CKD-EPI AA FEMALE: 27 mL/min/{1.73_m2} — ABNORMAL LOW (ref >=60)
EGFR CKD-EPI NON-AA FEMALE: 24 mL/min/{1.73_m2} — ABNORMAL LOW (ref >=60)
GLUCOSE RANDOM: 249 mg/dL — ABNORMAL HIGH (ref 65–179)
POTASSIUM: 4.8 mmol/L (ref 3.5–5.0)
SODIUM: 135 mmol/L (ref 135–145)

## 2018-04-05 LAB — EOSINOPHILS - ABS (DIFF): Lab: 0

## 2018-04-05 LAB — MANUAL DIFFERENTIAL
BLASTS - REL (DIFF): 4 % (ref <=0)
MONOCYTES - REL (DIFF): 48 %
NEUTROPHILS - ABS (DIFF): 4.8 10*9/L (ref 2.0–7.5)
NEUTROPHILS - REL (DIFF): 38 %

## 2018-04-05 LAB — SODIUM: Sodium:SCnc:Pt:Ser/Plas:Qn:: 135

## 2018-04-05 LAB — PHOSPHORUS: Phosphate:MCnc:Pt:Ser/Plas:Qn:: 6.8 — ABNORMAL HIGH

## 2018-04-05 LAB — URIC ACID: Urate:MCnc:Pt:Ser/Plas:Qn:: 10.4 — ABNORMAL HIGH

## 2018-04-05 LAB — HEMOGLOBIN: Lab: 7.8 — ABNORMAL LOW

## 2018-04-05 LAB — MAGNESIUM
MAGNESIUM: 1.7 mg/dL (ref 1.6–2.2)
Magnesium:MCnc:Pt:Ser/Plas:Qn:: 1.7

## 2018-04-05 LAB — MEAN CORPUSCULAR VOLUME: Lab: 95.3

## 2018-04-05 LAB — GLUCOSE FLUID: Lab: 139

## 2018-04-05 NOTE — Unmapped (Signed)
VSS overnight.  Pt's heart rate was 118 with last set of vs but she has been tachy off and on.  Afebrile.  No complaints of pain.  She continues to receive breathing treatments every 4 hours and she reports them being helpful.  She received 40 mg of lasix yesterday late in the afternoon and didn't have much output. She received another 80 mg overnight and has voided.  She continues to have labored breathing both sitting in chair and when moving around.  She remains on 4L oxygen.  Her Hgb is 7.8 this morning so she needs blood per standing orders.  Blood has been ordered but waiting to talk to MD about whether to give it or not d/t breathing.  Will update pt on plan of care as changes arise.  Monitoring.

## 2018-04-05 NOTE — Unmapped (Signed)
INDICATIONS: R pleural effusion in setting of severe coagulopathy.    CONSENT AND TIMEOUT:    After the risks benefits and alternatives of the procedure were thoroughly explained, informed consent was obtained including the risks of chest pain, cough, bleeding, infection, and injury to the lung or orther organs. Immediately prior to the procedure, the time out was executed including correct patient identification and agreement on the procedure to be performed.    PROCEDURE DETAILS:    On ultrasound examination a large, simple, right pleural effusion was noted.  The skin was marked. The area was prepped with chlorhexadine and draped in the usual sterile fashion.  Following this 7 mL of 1% lidocaine as injected subcutaneously to provide topical anesthesia.  A small incision was then made parallel and superior to the rib, the finder needle was inserted into the chest wall and advanced under constant aspiration.     Upon aspiration of sanguinous pleural fluid, a guidewire was threaded and the needle removed. The tract was then dilated and the 14 F chest tube advanced to the pleural space. The chest tube was then connected to the Pleurvac, sutured in place, and a sterile dressing was applied. The patient tolerated the procedure well.      FINDINGS: Successful R pigtail catheter placement.     SPECIMENS: Sent for Cell Count, pH, Extended Light's Criteria Chemistries, Cytology, and Culture.      ESTIMATED BLOOD LOSS:  None    COMPLICATIONS: No Immediate Post-Procedure Complications Noted.     PLAN: A follow up chest x-ray is ordered and is pending. Please keep the tube to suction and flush twice daily with 20 ccs of sterile saline towards and away from the patient. Please obtain daily chest X-rays while the tube is in place.

## 2018-04-06 LAB — MANUAL DIFFERENTIAL
BASOPHILS - ABS (DIFF): 0 10*9/L (ref 0.0–0.1)
BLASTS - REL (DIFF): 5 % (ref <=0)
BLASTS - REL (DIFF): 4 % (ref <=0)
EOSINOPHILS - ABS (DIFF): 0 10*9/L (ref 0.0–0.4)
EOSINOPHILS - REL (DIFF): 0 %
LYMPHOCYTES - ABS (DIFF): 0.8 10*9/L — ABNORMAL LOW (ref 1.5–5.0)
LYMPHOCYTES - ABS (DIFF): 0.6 10*9/L — ABNORMAL LOW (ref 1.5–5.0)
LYMPHOCYTES - REL (DIFF): 25 %
MONOCYTES - ABS (DIFF): 4.5 10*9/L — ABNORMAL HIGH (ref 0.2–0.8)
MONOCYTES - ABS (DIFF): 0.4 10*9/L (ref 0.2–0.8)
MONOCYTES - REL (DIFF): 58 %
MONOCYTES - REL (DIFF): 16 %
NEUTROPHILS - ABS (DIFF): 2.1 10*9/L (ref 2.0–7.5)
NEUTROPHILS - ABS (DIFF): 1.2 10*9/L — ABNORMAL LOW (ref 2.0–7.5)
NEUTROPHILS - REL (DIFF): 27 %
NEUTROPHILS - REL (DIFF): 55 %

## 2018-04-06 LAB — EGFR CKD-EPI AA FEMALE: Lab: 22 — ABNORMAL LOW

## 2018-04-06 LAB — CBC W/ AUTO DIFF
HEMATOCRIT: 24 % — ABNORMAL LOW (ref 36.0–46.0)
HEMOGLOBIN: 7.8 g/dL — ABNORMAL LOW (ref 12.0–16.0)
MEAN CORPUSCULAR HEMOGLOBIN: 30.9 pg (ref 26.0–34.0)
MEAN CORPUSCULAR VOLUME: 95.7 fL (ref 80.0–100.0)
MEAN PLATELET VOLUME: 9.7 fL (ref 7.0–10.0)
NUCLEATED RED BLOOD CELLS: 2 /100{WBCs} (ref <=4)
PLATELET COUNT: 38 10*9/L — ABNORMAL LOW (ref 150–440)
RED BLOOD CELL COUNT: 2.51 10*12/L — ABNORMAL LOW (ref 4.00–5.20)
RED CELL DISTRIBUTION WIDTH: 19.7 % — ABNORMAL HIGH (ref 12.0–15.0)
WBC ADJUSTED: 2.2 10*9/L — ABNORMAL LOW (ref 4.5–11.0)

## 2018-04-06 LAB — HEPATIC FUNCTION PANEL
ALKALINE PHOSPHATASE: 55 U/L (ref 38–126)
AST (SGOT): 9 U/L — ABNORMAL LOW (ref 14–38)
BILIRUBIN DIRECT: 0.2 mg/dL (ref 0.00–0.40)
BILIRUBIN TOTAL: 0.9 mg/dL (ref 0.0–1.2)

## 2018-04-06 LAB — CBC
HEMATOCRIT: 21.9 % — ABNORMAL LOW (ref 36.0–46.0)
HEMOGLOBIN: 7 g/dL — ABNORMAL LOW (ref 12.0–16.0)
MEAN CORPUSCULAR HEMOGLOBIN CONC: 32.1 g/dL (ref 31.0–37.0)
MEAN CORPUSCULAR HEMOGLOBIN: 30.7 pg (ref 26.0–34.0)
MEAN CORPUSCULAR VOLUME: 95.6 fL (ref 80.0–100.0)
PLATELET COUNT: 27 10*9/L — ABNORMAL LOW (ref 150–440)
RED BLOOD CELL COUNT: 2.29 10*12/L — ABNORMAL LOW (ref 4.00–5.20)
RED CELL DISTRIBUTION WIDTH: 19.7 % — ABNORMAL HIGH (ref 12.0–15.0)
WBC ADJUSTED: 2.4 10*9/L — ABNORMAL LOW (ref 4.5–11.0)

## 2018-04-06 LAB — BASIC METABOLIC PANEL
BLOOD UREA NITROGEN: 104 mg/dL — ABNORMAL HIGH (ref 7–21)
CALCIUM: 8.3 mg/dL — ABNORMAL LOW (ref 8.5–10.2)
CHLORIDE: 97 mmol/L — ABNORMAL LOW (ref 98–107)
CO2: 20 mmol/L — ABNORMAL LOW (ref 22.0–30.0)
CREATININE: 2.4 mg/dL — ABNORMAL HIGH (ref 0.60–1.00)
EGFR CKD-EPI AA FEMALE: 22 mL/min/{1.73_m2} — ABNORMAL LOW (ref >=60)
EGFR CKD-EPI NON-AA FEMALE: 19 mL/min/{1.73_m2} — ABNORMAL LOW (ref >=60)
GLUCOSE RANDOM: 349 mg/dL — ABNORMAL HIGH (ref 65–179)
POTASSIUM: 5.7 mmol/L — ABNORMAL HIGH (ref 3.5–5.0)
SODIUM: 131 mmol/L — ABNORMAL LOW (ref 135–145)

## 2018-04-06 LAB — PROTEIN TOTAL: Protein:MCnc:Pt:Ser/Plas:Qn:: 5.5 — ABNORMAL LOW

## 2018-04-06 LAB — MAGNESIUM: Magnesium:MCnc:Pt:Ser/Plas:Qn:: 1.8

## 2018-04-06 LAB — NUCLEATED RED BLOOD CELLS: Lab: 2

## 2018-04-06 LAB — LYMPHOCYTES - ABS (DIFF): Lab: 0.8 — ABNORMAL LOW

## 2018-04-06 LAB — PHOSPHORUS: Phosphate:MCnc:Pt:Ser/Plas:Qn:: 9.1 — ABNORMAL HIGH

## 2018-04-06 LAB — HEMATOCRIT: Lab: 21.9 — ABNORMAL LOW

## 2018-04-06 LAB — URIC ACID: Urate:MCnc:Pt:Ser/Plas:Qn:: 10.7 — ABNORMAL HIGH

## 2018-04-06 LAB — EOSINOPHILS - REL (DIFF): Lab: 0

## 2018-04-06 NOTE — Unmapped (Signed)
Daily Progress Note    Assessment/Plan:    Jessica Wilson is a 77 y.o. female who presented to [facility] with Acute and chronic respiratory failure with hypoxia (CMS-HCC) concerning for differentiation syndrome vs leukocytic infiltration vs pneumonia.    Hypoxic respiratory failure: Differential includes differentiation syndrome vs pulmonary leukocyte infiltration vs pneumonia. Pneumonia less likely given the lack of focal signs and recent history of appropriate antibiotic treatment, but fungal pneumonia still on the differetial. CT on 7/22 showed new b/l ground glass opacities and R-sided pleural effusion. VRP negative. BAL cx's no growth at 4 days. Thoracentesis consistent with exudative effusion. Chest tube with bloody drainage consistent with hemothorax.  - Discontinue posaconazole  - Wean O2 as tolerated  - Monitor chest tube output  - Continue 40 mg prednisone (day 2/5)  - Continue 500 mg levofloxacin (day2/5)  - Scheduled albuterol nebulizers  - Continue Symbicort inhaler BID  - Holding diuresis 2/2 AKI    AKI: Likely related to over-diuresis. With hyperkalemia, hyperphosphatemia, and elevated uric acid. ECG with no hyperkalemic changes.    - 30 g kayexalate  - 1600 mg Sevelamer TID  - 300 mg allopurinol BID  - 500 cc NS bolus    Advanced Care Planning: Upon admission she was DNR/DNI. Given her admission for acute hypoxic respiratory failure, we discussed her code status. She said she would be amenable to intubation and transfer to the MICU if there was a reversible cause of her respiratory failure. She was clear that she would not want chest compressions if she were to have cardiac arrest.     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 Malen Gauze as an outpatient as well as Dr. Donneta Romberg locally in [place]. Was on Enasidenib since 06/2017 and this was discontinued at admission. FLT3 negative.    - 300mg  allopurinol BID.  - Decrease hydroxyurea to 1g BID  - Daily BMP   - Daily CBC w/ diff  - Transfuse for Hgb <7 and platelets <10  -??Transfuse cryoprecipitate for fibrinogen <150 if bleeding or consumptive process??  - Continue acyclovir for prophylaxis  - Will discuss with Dr. Malen Gauze regarding future chemotherapy once myeloid mutation panel results.    ___________________________________________________________________    Subjective:  -Dyspnea improved after chest tube placement.   - 300 cc of bloody drainage from the chest tube overnight.    Labs/Studies:  Labs and Studies from the last 24hrs per EMR and Reviewed   Uric acid: 10.7 (stable); Phos: 9.1 (up from 6.8); K:5.7 (up from 4.8)          Objective:  Temp:  [36.3 ??C-37.1 ??C] 36.5 ??C  Heart Rate:  [77-114] 84  Resp:  [20-28] 21  BP: (105-152)/(38-71) 128/47  SpO2:  [93 %-100 %] 98 %    Gen: Tired appearing, elderly lady resting comfortably in the bed; no acute distress  Card: RRR, no m/r/g, S1/S2 normal  Pulm: Diffuse bilateral wheezes; chest tube in place with small non-tender hematoma.  Abd: Non-distended  Neuro: Alert and conversational, moves all extremities equally.  Ext: No apparent edema.    Harrell Gave, MD  Neurology Resident, PGY-1  April 06, 2018 12:07 PM

## 2018-04-06 NOTE — Unmapped (Signed)
Pulmonary Consult Service  Follow-Up Note      Primary Service:   Hematology / Oncology  Primary Service Attending:  Vito Berger, MD  Reason for Consult:   Hypoxemic respiratory failure      SUBJECTIVE:       Reportedly patient had multiple episodes of desaturation that required up titration of nasal cannula from 4 L to 6 L.  Diuresis was attempted with 40 mg of IV Lasix, however limited given rising creatinine.  Chest x-ray demonstrated increasing opacification of the right hemidiaphragm more likely thought to be due to pleural effusion rather than consolidation.  proBNP elevated at 2050 increased compared to 4 days ago.  Reportedly patient started on posaconazole as worsening breathing reportedly started in the context of initiating steroids for gout flare.    Today patient reports increasing cough and increasing dyspnea.  She reports increasing wheezing and dyspnea.  Denies any chest pain, nausea or vomiting.  Denies any fevers or chills.    Medications:     Scheduled Meds:  ??? acyclovir  400 mg Oral BID   ??? ipratropium  500 mcg Nebulization Q4H    And   ??? albuterol  2.5 mg Nebulization Q4H   ??? allopurinol  300 mg Oral BID   ??? budesonide-formoterol  2 puff Inhalation BID (RT)   ??? buPROPion  150 mg Oral BID   ??? diltiazem  180 mg Oral Daily   ??? hydroxyurea  2,000 mg Oral BID   ??? insulin regular  0-12 Units Subcutaneous ACHS   ??? [START ON 04/06/2018] levoFLOXacin  250 mg Oral Q24H SCH   ??? levoFLOXacin  500 mg Oral Once   ??? posaconazole  300 mg Oral Q24H   ??? predniSONE  40 mg Oral Daily   ??? sertraline  100 mg Oral Daily   ??? sodium chloride  10 mL Intravenous BID     Continuous Infusions:  ??? sodium chloride 20 mL/hr (03/30/18 1600)     PRN Meds:.acetaminophen, albuterol, cetirizine, loperamide, loperamide, phenol, prochlorperazine, sodium chloride     PHYSICAL EXAM:     Patient Vitals for the past 24 hrs:   BP Temp Temp src Pulse Resp SpO2 Weight   04/05/18 1737 134/50 36.9 ??C Oral 100 22 100 % ???   04/05/18 1730 134/50 36.9 ??C Oral 100 22 100 % ???   04/05/18 1638 105/64 ??? ??? ??? ??? ??? ???   04/05/18 1636 ??? 36.7 ??C Oral 102 26 98 % ???   04/05/18 1607 115/46 36.9 ??C Oral 102 ??? 98 % ???   04/05/18 1545 117/46 36.7 ??C Oral 104 28 95 % ???   04/05/18 1231 137/51 36.3 ??C Oral 114 25 93 % ???   04/05/18 0821 129/62 36.8 ??C Oral 115 24 93 % ???   04/05/18 0442 137/56 36.8 ??C Oral 118 20 93 % ???   04/05/18 0348 ??? ??? ??? ??? 22 ??? ???   04/04/18 2325 114/43 36.7 ??C Oral 73 20 98 % ???   04/04/18 2015 ??? ??? ??? ??? ??? ??? 96.9 kg (213 lb 10 oz)   04/04/18 1940 133/70 36.6 ??C Oral 105 25 95 % ???     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)  Mouth: Moist mucous membranes  Neck/ENT: Trachea supple and midline, unable to assess JVD secondary to habitus  CV: Sinus tachycardia, no murmurs, rubs,  or gallops appreciated  Resp: Noticeable pursed lip breathing, expiratory wheezes bilaterally, decreased breath sounds on the  right base  GI: Significant abdominal adiposity; Abdomen soft, non-tender, nondistended; no organomegaly appreciated  MSK: No pedal edema; no clubbing or cyanosis; upper/lower extremity muscle atrophy  Skin: No rash present on visible extremities, no jaundice  Neuro: R eye affected since birth (unknown etiology); otherwise no gross neurologic impairments appreciated, alert and oriented x3    LABORATORY and RADIOLOGY DATA:     Pertinent Laboratory Data from the last 24 hours:  Lab Results   Component Value Date    WBC 12.5 (H) 04/05/2018    HGB 7.8 (L) 04/05/2018    HCT 24.3 (L) 04/05/2018    PLT 11 (L) 04/05/2018     Lab Results   Component Value Date    NA 135 04/05/2018    K 4.8 04/05/2018    CL 99 04/05/2018    CO2 24.0 04/05/2018    BUN 74 (H) 04/05/2018    CREATININE 2.01 (H) 04/05/2018    GLU 249 (H) 04/05/2018    CALCIUM 8.7 04/05/2018    MG 1.7 04/05/2018    PHOS 6.8 (H) 04/05/2018       Lab Results   Component Value Date    BILITOT 1.1 03/31/2018    BILIDIR 0.20 03/31/2018    PROT 6.5 03/31/2018    ALBUMIN 3.6 03/31/2018    ALT 14 (L) 03/31/2018    AST 17 03/31/2018    ALKPHOS 68 03/31/2018       Lab Results   Component Value Date    INR 1.23 03/15/2018    APTT 38.2 03/15/2018       Pertinent Micro Data:  BAL specimen collected 04/01/2018 with the following orders: Legionella Cx- negative , Bronch Cx- negative  AFB smear negative, RVP-negative , Fungal Cx- negative , Pneumocystis DFA, negative-, Asperguillus Galactomannan Ag- pending     Body Fluid Cell count:   Status:  Edited Result - FINAL ????Visible to patient:  No (Not Released) Next appt:  None     Ref Range & Units 04/01/18 1329    Fluid Type  Lavage, Bronchial     Color, Fluid  Pink     Appearance, Fluid  Cloudy     Nucleated Cells, Fluid Undefined ul 153     RBC, Fluid ul 4,825     Neutrophil %, Fluid % 7.0     Lymphocytes %, Fluid % 3.0     Mono/Macro % , Fluid % 85.0     Other Cells %, Fluid % 5.0     #Cells Counted BF Diff  100     Fluid Comments     Comment: Macrophages present.   Macrophages with inclusions present.   TISSUE CELLS PRESENT.   Degenerating cells present.          Bronch Cx: with NGTD    Bronchial alveolar lavage fluid, cytospin review and flow cytometry  -  Bloody specimen with less than 1% CD34-positive blasts by flow cytometric analysis - pathologist's comment   Given the bloody nature of the sample, the low percentage of blasts identified may represent peripheral contamination rather than true involvement of pulmonary parenchyma by AML.    Cytology:  Final Diagnosis   A: Lung, right upper lobe, bronchoalveolar lavage   No definitive evidence of malignancy        ASSESSMENT and PLAN     Jessica Wilson is a 77 y.o. woman with  PMH significant for mild COPD, OSA, and AML on enasidenib therapy with recent labs indicating proliferating blasts and monocytes, and recent hospitalization for pneumonia who re-presented to [facility] with persistent SOB/DOE and hypoxemia with new imaging illustrating diffuse GGO, R pleural effusion, and R-sided paracardiac opacity concerning for leukemic infiltration vs differentiation syndrome vs atypical infection. Pulmonology was for bronchoscopy w/ BAL.      Patient underwent bronchoscopy and thus far BAL has been negative for infection or evidence of leukemic infiltrate.      Patient's respiratory status is has been relatively stable since BAL up until about a day ago.  It appears patient has had increasing oxygen requirements and increasing dyspnea.  Given patient's increasing dyspnea and chest x-ray demonstrating right-sided pleural effusion proceeded to place right-sided chest tube for diagnostic and therapeutic purposes.  For diagnostic purposes there is a question of whether this effusion was merely a transudate of process related to volume overload which is reflected in an elevated BNP since 03/22/2018 persistent exudative  process/complex parapneumonic effusion given the recent admission for a possible infectious pneumonia.  Furthermore suspect that draining pleural fluid may help resolve some of the patient's dyspnea as I suspect the pleural effusion is possibly causing a degree of compressive atelectasis.    Patient certainly appeared more dyspneic on exam with bilateral expiratory wheezes which brings up the suspicion of a possible COPD exacerbation however given patient's rising proBNP unclear if wheezing actually more a  byproduct of volume overload. Patient has received multiple transfusions and     Rising BNP with increased hypoxia could also be secondary to pulmonary embolus however I suspect this is much less likely considering that the patient has severe thrombocytopenia which would make clot formation somewhat difficult and furthermore there are other explanations for patient hypoxemia.     RECOMMENDATIONS:  --Would consider continuing Symbicort twice daily as well as every 4 duo nebs, 5-day course of 40 mg prednisone, and levofloxacin for possible COPD exacerbation.  ??? Continue diuresis as tolerated  ??? follow-up pleural  fluid studies  --Continue CPAP use nightly   --Chest tube on wall suction to assist with drainage of pleural effusion    Thank you for allowing [place] to participate in this patient's care. Please do not hesitate to call the on-call pulmonary fellow at [phone] with any questions.  ??  This patient was seen and discussed with attending physician, Dr. Evalyn Casco, who agrees with the assessment and plan above.

## 2018-04-06 NOTE — Unmapped (Signed)
Problem: Adult Inpatient Plan of Care  Goal: Plan of Care Review  Outcome: Progressing  Goal: Patient-Specific Goal (Individualization)  Outcome: Progressing  Goal: Absence of Hospital-Acquired Illness or Injury  Outcome: Progressing  Goal: Optimal Comfort and Wellbeing  Outcome: Progressing  Goal: Readiness for Transition of Care  Outcome: Progressing  Goal: Rounds/Family Conference  Outcome: Progressing     Problem: Self-Care Deficit  Goal: Improved Ability to Complete Activities of Daily Living  Outcome: Progressing     Problem: Fall Injury Risk  Goal: Absence of Fall and Fall-Related Injury  Outcome: Progressing     Problem: COPD Comorbidity  Goal: Maintenance of COPD Symptom Control  Outcome: Progressing     Problem: Hypertension Comorbidity  Goal: Blood Pressure in Desired Range  Outcome: Progressing     Problem: Obstructive Sleep Apnea Risk or Actual (Comorbidity Management)  Goal: Unobstructed Breathing During Sleep  Outcome: Progressing     Problem: Pain Chronic (Persistent) (Comorbidity Management)  Goal: Acceptable Pain Control and Functional Ability  Outcome: Progressing   Pt with Pleurvac in place.Received 1 unit plts due to procedure at bedside. Continues on 4lpm of O2.

## 2018-04-06 NOTE — Unmapped (Signed)
Daily Progress Note    Assessment/Plan:    Jessica Wilson is a 77 y.o. female who presented to [facility] with Acute and chronic respiratory failure with hypoxia (CMS-HCC) concerning for differentiation syndrome vs leukocytic infiltration vs pneumonia.    Hypoxic respiratory failure: Differential includes differentiation syndrome vs pulmonary leukocyte infiltration vs pneumonia. Pneumonia less likely given the lack of focal signs and recent history of appropriate antibiotic treatment, but fungal pneumonia still on the differetial. CT on 7/22 showed new b/l ground glass opacities and R-sided pleural effusion. VRP negative.   - F/u BAL leukemia panel and cx's  - Scheduled albuterol nebulizers  - Continue Symbicort inhaler BID  - Treat for potential COPD exacerbation; 40 pred and levofloxacin 500 mg qd x5d.  - No lasix given AKI and tachycardia  - Monitor chest tube placed by pulm  - Posaconazole 300mg  BID for 2 days and 300mg  daily    Advanced Care Planning: Upon admission she was DNR/DNI. Given her admission for acute hypoxic respiratory failure, we discussed her code status. She said she would be amenable to intubation and transfer to the MICU if there was a reversible cause of her respiratory failure. She was clear that she would not want chest compressions if she were to have cardiac arrest.     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 Malen Gauze as an outpatient as well as Dr. Donneta Romberg locally in [place]. Has been on Enasidenib since 06/2017. FLT3 negative.    - 300mg  allopurinol BID.  - Continue hydroxyurea 2g BID  - Hold Enasidenib.  - Daily BMP   - Daily CBC w/ diff  - Transfuse for Hgb <7 and platelets <10  -??Transfuse cryoprecipitate for fibrinogen <150 if bleeding or consumptive process??  - Continue acyclovir for prophylaxis  - Will discuss with Dr. Malen Gauze regarding future chemotherapy once myeloid mutation panel results.    Gout flare: Elevated uric acid (max=12.4) likely related to TLS with new L great toe pain.  - 300 mg allopurinol BID as above  - Prednisone 20mg  BID x5 days  ___________________________________________________________________    Subjective:  -Dyspnea overnight. Transiently increased O2 requirement to 6 L. Lasix given, patient tachycardic and labs showing AKI.    Labs/Studies:  Labs and Studies from the last 24hrs per EMR and Reviewed          Objective:  Temp:  [36.3 ??C-36.9 ??C] 36.9 ??C  Heart Rate:  [73-118] 100  Resp:  [20-28] 22  BP: (105-137)/(43-70) 134/50  SpO2:  [93 %-100 %] 100 %    Gen: Tired appearing, elderly lady sitting in the chair increased work of breathing and with mild respiratory distress  Card: RRR, no m/r/g, S1/S2 normal  Pulm: Slight pursing of the lips with mild dyspnea  Abd: Non-distended  Neuro: Alert and conversational, moves all extremities equally.  Ext: No apparent edema.

## 2018-04-07 DIAGNOSIS — J9621 Acute and chronic respiratory failure with hypoxia: Principal | ICD-10-CM

## 2018-04-07 LAB — CBC W/ AUTO DIFF
HEMATOCRIT: 22.3 % — ABNORMAL LOW (ref 36.0–46.0)
HEMOGLOBIN: 7.2 g/dL — ABNORMAL LOW (ref 12.0–16.0)
MEAN CORPUSCULAR HEMOGLOBIN CONC: 32.1 g/dL (ref 31.0–37.0)
MEAN CORPUSCULAR HEMOGLOBIN: 30.7 pg (ref 26.0–34.0)
MEAN PLATELET VOLUME: 8 fL (ref 7.0–10.0)
PLATELET COUNT: 13 10*9/L — ABNORMAL LOW (ref 150–440)
RED BLOOD CELL COUNT: 2.33 10*12/L — ABNORMAL LOW (ref 4.00–5.20)
RED CELL DISTRIBUTION WIDTH: 19.5 % — ABNORMAL HIGH (ref 12.0–15.0)
WBC ADJUSTED: 1.1 10*9/L — ABNORMAL LOW (ref 4.5–11.0)

## 2018-04-07 LAB — MANUAL DIFFERENTIAL
BASOPHILS - ABS (DIFF): 0 10*9/L (ref 0.0–0.1)
BASOPHILS - REL (DIFF): 0 %
EOSINOPHILS - ABS (DIFF): 0 10*9/L (ref 0.0–0.4)
EOSINOPHILS - REL (DIFF): 0 %
LYMPHOCYTES - ABS (DIFF): 0.3 10*9/L — ABNORMAL LOW (ref 1.5–5.0)
LYMPHOCYTES - REL (DIFF): 23 %
MONOCYTES - ABS (DIFF): 0.2 10*9/L (ref 0.2–0.8)
MONOCYTES - REL (DIFF): 21 %
NEUTROPHILS - REL (DIFF): 52 %

## 2018-04-07 LAB — HYPOCHROMIA

## 2018-04-07 LAB — BASIC METABOLIC PANEL
ANION GAP: 12 mmol/L (ref 9–15)
CALCIUM: 8 mg/dL — ABNORMAL LOW (ref 8.5–10.2)
CO2: 20 mmol/L — ABNORMAL LOW (ref 22.0–30.0)
CREATININE: 2.02 mg/dL — ABNORMAL HIGH (ref 0.60–1.00)
EGFR CKD-EPI AA FEMALE: 27 mL/min/{1.73_m2} — ABNORMAL LOW (ref >=60)
EGFR CKD-EPI NON-AA FEMALE: 23 mL/min/{1.73_m2} — ABNORMAL LOW (ref >=60)
GLUCOSE RANDOM: 341 mg/dL — ABNORMAL HIGH (ref 65–179)
POTASSIUM: 5 mmol/L (ref 3.5–5.0)
SODIUM: 133 mmol/L — ABNORMAL LOW (ref 135–145)

## 2018-04-07 LAB — BLOOD UREA NITROGEN: Urea nitrogen:MCnc:Pt:Ser/Plas:Qn:: 107 — ABNORMAL HIGH

## 2018-04-07 LAB — MAGNESIUM: Magnesium:MCnc:Pt:Ser/Plas:Qn:: 1.9

## 2018-04-07 LAB — PHOSPHORUS: Phosphate:MCnc:Pt:Ser/Plas:Qn:: 7.9 — ABNORMAL HIGH

## 2018-04-07 LAB — PLATELET COUNT: Lab: 26 — ABNORMAL LOW

## 2018-04-07 LAB — URIC ACID: Urate:MCnc:Pt:Ser/Plas:Qn:: 9.5 — ABNORMAL HIGH

## 2018-04-07 LAB — NEUTROPHILS - REL (DIFF): Lab: 52

## 2018-04-07 NOTE — Unmapped (Signed)
Pulmonary Consult Service  Follow-Up Note      Primary Service:   Hematology / Oncology  Primary Service Attending:  Vito Berger, MD  Reason for Consult:   Hypoxia    ASSESSMENT and PLAN     Jessica Wilson is a 77 y.o. 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  She was treated with antibiotics and the initial admission when she had nodular ill-defined opacities bilaterally which disappeared and her repeat CT scan done a couple weeks later for worsening hypoxia however showed groundglass opacities of bilateral upper lobes with small nodular reticular shadows.  Given the circumstances, differentials including infection, leukemic infiltrates or differentiation syndrome were considered.  Bronchoscopy with BAL was performed and results did not show any infection so far.  She also had a pleural effusion which was small and was thought to initially not be the cause of her hypoxia.  However over Saturday and Sunday, she had dyspnea, and increasing pleural effusion for which a pigtail catheter was put in on Sunday.  She has been receiving a unit of blood and platelet almost every day or every other day.  Pleural fluid analysis shows an exudative fluid, however has more than 1 million RBCs, concerning for hemorrhagic effusion.  On our recommendations, the primary team got hematocrit on the pleural fluid, which is less than 50% of the hematocrit in the blood, making hemothorax less likely.  The pH on the pleural fluid is less than 7, concerning for empyema, however she does not have signs of fever or severe ongoing infection making it less likely.  Therefore in the setting of hemorrhagic effusion with a low pH, the chances of the effusion being malignant effusion arising from her leukemia is likely.    At this time, would continue chest tube care, and if the pleural fluid drainage is less than 100 for 24 hours, we will plan to remove it.  Would recommend getting daily chest x-rays as well.    Recommendations:  -Daily chest x-ray  -Pigtail catheter drainage of less than 100, will plan to remove  -Flow cytometry on the pleural fluid  -Incentive spirometry, out of bed to chair, wean oxygen as tolerated      Thank you for the opportunity to participate in this patient's care. Please do not hesitate to call the on-call pulmonary fellow at [phone] with any questions regarding her care.    This patient was seen and discussed with Dr. Leticia Clas.   Maia Plan, MD  PGY 4, Pulmonary and Critical Care  Pager: [phone]  April 06, 2018 5:32 PM     SUBJECTIVE:     Patient feels well since placement of the pigtail catheter yesterday.  Mentions that her shortness of breath seems to be better and improved.    She had 340 cc of red-colored fluid collected in the container connected to the pigtail catheter.    Medications:     Scheduled Meds:  ??? acyclovir  400 mg Oral BID   ??? ipratropium  500 mcg Nebulization Q4H    And   ??? albuterol  2.5 mg Nebulization Q4H   ??? allopurinol  300 mg Oral BID   ??? budesonide-formoterol  2 puff Inhalation BID (RT)   ??? buPROPion  150 mg Oral BID   ??? diltiazem  180 mg Oral Daily   ??? hydroxyurea  1,000 mg Oral BID   ??? insulin NPH  10 Units Subcutaneous  BID AC   ??? insulin regular  0-12 Units Subcutaneous ACHS   ??? levoFLOXacin  250 mg Oral Q24H SCH   ??? predniSONE  40 mg Oral Daily   ??? sertraline  100 mg Oral Daily   ??? sevelamer  1,600 mg Oral 3xd Meals   ??? sodium chloride  10 mL Intravenous BID     Continuous Infusions:  ??? sodium chloride 20 mL/hr (03/30/18 1600)     PRN Meds:.acetaminophen, albuterol, cetirizine, HYDROmorphone, loperamide, loperamide, oxyCODONE, phenol, prochlorperazine, sodium chloride     PHYSICAL EXAM:   BP 124/40  - Pulse 87  - Temp 36.3 ??C (Oral)  - Resp 24  - Ht 160 cm (5' 2.99)  - Wt 96.9 kg (213 lb 10 oz)  - SpO2 98%  - BMI 37.85 kg/m??     Gen: Patient AAO x 3, no conjunctival pallor, icterus,  no cyanosis or clubbing. Eyes: PERRLA  Head neck ENT: Normocephalic, no jugular venous distention, moist oral mucosa, no oropharyngeal abnormalities, neck supple  CVS: Heart sounds heard normally, no murmurs, rubs or gallops appreciated  Respiratory: Trachea midline, equal expansion of chest wall, air entry bilaterally equal, however reduced air entry in right side base  Abdomen: BS present, no hepatomegaly or splenomegaly, non tender abdomen, no scars noted.  Neuro: Cranial nerves grossly intact, no motor or sensory deficits  Skin extremities: No rash, no pedal edema  LABORATORY and RADIOLOGY DATA:     Pertinent Laboratory Data from the last 24 hours:  Lab Results   Component Value Date    WBC 2.4 (L) 04/06/2018    HGB 7.0 (L) 04/06/2018    HCT 21.9 (L) 04/06/2018    PLT 27 (L) 04/06/2018     Lab Results   Component Value Date    NA 131 (L) 04/06/2018    K 5.7 (H) 04/06/2018    CL 97 (L) 04/06/2018    CO2 20.0 (L) 04/06/2018    BUN 104 (H) 04/06/2018    CREATININE 2.40 (H) 04/06/2018    GLU 349 (H) 04/06/2018    CALCIUM 8.3 (L) 04/06/2018    MG 1.8 04/06/2018    PHOS 9.1 (H) 04/06/2018       Lab Results   Component Value Date    BILITOT 0.9 04/06/2018    BILIDIR 0.20 04/06/2018    PROT 5.5 (L) 04/06/2018    ALBUMIN 3.1 (L) 04/06/2018    ALT 16 04/06/2018    AST 9 (L) 04/06/2018    ALKPHOS 55 04/06/2018       Lab Results   Component Value Date    INR 1.23 03/15/2018    APTT 38.2 03/15/2018       Pertinent Micro Data:  Procedure Component Value Units Date/Time   Hematocrit, Body Fluid [[id number]] Collected: 04/06/18 1612   Order Status: Completed Specimen: Fluid, Pleural Updated: 04/06/18 1720    Hematocrit, Fluid 4 %     Hematocrit, Fluid Type Fluid, Pleural   Pleural Fluid Culture [[id number]] Collected: 04/05/18 1627   Order Status: Completed Specimen: Fluid, Pleural from Pleural, right Updated: 04/06/18 1652    Pleural Fluid Culture NO GROWTH TO DATE    Gram Stain Result 1+ Polymorphonuclear leukocytes     No organisms seen   Narrative: ??   Specimen Source: Pleural, right   Hematocrit, Body Fluid [[id number]] Collected: 04/06/18 1613   Order Status: Canceled Specimen: Fluid, Pleural Updated: 04/06/18 1635   Hematocrit, Body Fluid [[id number]]    Order Status: Canceled  Specimen: Fluid, Pleural    Hematopathology Leukemia/Lymphoma Flow Cytometry, Body Fluid [[id number]] Collected: 04/05/18 1430   Order Status: Completed Specimen: Fluid, Pleural Updated: 04/05/18 1905    Fluid Type Pleular Fluid    Comment: This is a corrected result. Previous result was Fluid, Pleural on 04/05/2018 at 1628 EDT       Color, Fluid Red    Appearance, Fluid Opaque    Nucleated Cells, Fluid 3,660 ul     RBC, Fluid 1,310,000 ul     #Cells Counted BF Diff 100    Neutrophil %, Fluid 35.0 %     Lymphocytes %, Fluid 14.0 %     Mono/Macro % , Fluid 51.0 %     Fluid Comments Macrophages present.    Albumin Level, Body Fluid [[id number]] Collected: 04/05/18 1430   Order Status: Completed Specimen: Fluid, Pleural Updated: 04/05/18 1655    Albumin, Fluid 2.8 g/dL     Albumin, Fluid Type Fluid, Pleural         Lactate Dehydrogenase, Body Fluid [[id number]] Collected: 04/05/18 1430   Order Status: Completed Specimen: Fluid, Pleural Updated: 04/05/18 1655    LDH, Fluid 1,697 U/L     LD, Fluid Type Fluid, Pleural         Protein, Body Fluid [[id number]] Collected: 04/05/18 1430   Order Status: Completed Specimen: Fluid, Pleural Updated: 04/05/18 1655    Protein, Fluid 5.1 g/dL     Protein, Fluid Type Fluid, Pleural         PH, Fluid [[id number]] Collected: 04/05/18 1430   Order Status: Completed Specimen: Fluid, Pleural Updated: 04/05/18 1604    pH, Fluid 6.98    pH, Fluid Type Fluid, Pleural

## 2018-04-07 NOTE — Unmapped (Signed)
Problem: Adult Inpatient Plan of Care  Goal: Plan of Care Review  Outcome: Progressing  Goal: Patient-Specific Goal (Individualization)  Outcome: Progressing  Goal: Absence of Hospital-Acquired Illness or Injury  Outcome: Progressing  Goal: Optimal Comfort and Wellbeing  Outcome: Progressing  Goal: Readiness for Transition of Care  Outcome: Progressing  Goal: Rounds/Family Conference  Outcome: Progressing     Problem: Self-Care Deficit  Goal: Improved Ability to Complete Activities of Daily Living  Outcome: Progressing     Problem: Fall Injury Risk  Goal: Absence of Fall and Fall-Related Injury  Outcome: Progressing     Problem: COPD Comorbidity  Goal: Maintenance of COPD Symptom Control  Outcome: Progressing     Problem: Hypertension Comorbidity  Goal: Blood Pressure in Desired Range  Outcome: Progressing     Problem: Obstructive Sleep Apnea Risk or Actual (Comorbidity Management)  Goal: Unobstructed Breathing During Sleep  Outcome: Progressing     Problem: Pain Chronic (Persistent) (Comorbidity Management)  Goal: Acceptable Pain Control and Functional Ability  Outcome: Progressing  Pt stated feeling a lot better today than yesterday. Up in chair most of shift. Port access needle changed. No request for pain meds this shift. To wean o2 down to 2lpm.

## 2018-04-07 NOTE — Unmapped (Signed)
VSS overnight.  Pain med given x 1 with relief noted.  Pt breathing easier overnight and is now getting breathing treatments every 4 hours.  Oxygen was increased back to 4L/nasal cannla from 2.5L due to her heavy sleeping and feeling like she wanted more oxygen.  She is going well with getting from bed to bsc and back with standby assist.  She had better urine output overnight than previous shifts.  Will update pt on plan of care as changes arise.  Will monitor.

## 2018-04-07 NOTE — Unmapped (Signed)
Daily Progress Note    Assessment/Plan:    Jessica Wilson is a 77 y.o. female who presented to [facility] with Acute and chronic respiratory failure with hypoxia (CMS-HCC) concerning for differentiation syndrome vs leukocytic infiltration vs pneumonia.    Hypoxic respiratory failure: Differential includes differentiation syndrome vs pulmonary leukocyte infiltration vs pneumonia. Pneumonia less likely given the lack of focal signs and recent history of appropriate antibiotic treatment, but fungal pneumonia still on the differential and she treated empirically treated with antifungals from 7/27-7/29. CT on 7/22 showed new b/l ground glass opacities and R-sided pleural effusion. VRP negative. BAL cx's no growth at 4 days. Thoracentesis consistent with exudative effusion. Chest tube with bloody drainage, pulm does not feel it is consistent with hemothorax.  - Wean O2 as tolerated  - Monitor chest tube output  - Continue 40 mg prednisone (day 3/5)  - Continue 500 mg levofloxacin (day 3/5)  - Scheduled albuterol nebulizers  - Continue Symbicort inhaler BID  - Holding diuresis 2/2 AKI    A-fib with RVR: Has a h/o A-fib and is on diltiazem at home. This morning she was tachycardic to the 130s with ECG significant for A-fib. This is most likely in the setting of her current hospital admission.   - Continue 180 mg Diltiazem ER daily     - Start 12.5 mg metoprolol q6h for rate control. Will titrate up for HR <110    AKI: Likely related to over-diuresis. With hyperkalemia, hyperphosphatemia, and elevated uric acid. ECG with no hyperkalemic changes.    - 1600 mg Sevelamer TID  - 300 mg allopurinol BID    Advanced Care Planning: Upon admission she was DNR/DNI. Given her admission for acute hypoxic respiratory failure, we discussed her code status. She said she would be amenable to intubation and transfer to the MICU if there was a reversible cause of her respiratory failure. She was clear that she would not want chest compressions if she were to have cardiac arrest.     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 Malen Gauze as an outpatient as well as Dr. Donneta Romberg locally in [place]. Was on Enasidenib since 06/2017 and this was discontinued at admission. FLT3 negative.    - 300mg  allopurinol BID.  - Continue hydroxyurea to 1g BID  - Daily BMP   - Daily CBC w/ diff  - Transfuse for Hgb <7 and platelets <10  -??Transfuse cryoprecipitate for fibrinogen <150 if bleeding or consumptive process??  - Continue acyclovir for prophylaxis  - Will discuss with Dr. Malen Gauze regarding future chemotherapy once myeloid mutation panel results.    ___________________________________________________________________    Subjective:  -Dyspnea improved after chest tube placement. Has been down to 2L during the day.  - 180 cc serosanguinous drainage in the past 24 hours.    Labs/Studies:  Labs and Studies from the last 24hrs per EMR and Reviewed   Uric acid: 9.5 (stable); Phos: 7.9 (down from 9.1); K:5 (down from 5.7); Cr: 2.02 (down from 2.4)         Objective:  Temp:  [36.3 ??C-36.8 ??C] 36.6 ??C  Heart Rate:  [87-134] 125  Resp:  [20-99] 20  BP: (107-148)/(38-57) 111/39  SpO2:  [94 %-99 %] 94 %    Gen: Tired appearing, elderly lady resting comfortably in the bed; no acute distress  Card: RRR, no m/r/g, S1/S2 normal  Pulm: Diffuse bilateral wheezes; chest tube in place with small non-tender hematoma.  Abd: Non-distended  Neuro: Alert and conversational, moves all  extremities equally.  Ext: No apparent edema.    Harrell Gave, MD  Neurology Resident, PGY-1  April 07, 2018 12:54 PM

## 2018-04-08 DIAGNOSIS — J9621 Acute and chronic respiratory failure with hypoxia: Principal | ICD-10-CM

## 2018-04-08 LAB — MANUAL DIFFERENTIAL
BASOPHILS - ABS (DIFF): 0 10*9/L (ref 0.0–0.1)
BLASTS - REL (DIFF): 5 % (ref <=0)
EOSINOPHILS - ABS (DIFF): 0 10*9/L (ref 0.0–0.4)
EOSINOPHILS - REL (DIFF): 0 %
LYMPHOCYTES - REL (DIFF): 21 %
MONOCYTES - ABS (DIFF): 0.4 10*9/L (ref 0.2–0.8)
MONOCYTES - REL (DIFF): 29 %
NEUTROPHILS - ABS (DIFF): 0.6 10*9/L — ABNORMAL LOW (ref 2.0–7.5)
NEUTROPHILS - REL (DIFF): 45 %

## 2018-04-08 LAB — PHOSPHORUS: Phosphate:MCnc:Pt:Ser/Plas:Qn:: 6 — ABNORMAL HIGH

## 2018-04-08 LAB — CBC W/ AUTO DIFF
HEMATOCRIT: 20.1 % — ABNORMAL LOW (ref 36.0–46.0)
HEMOGLOBIN: 6.5 g/dL — ABNORMAL LOW (ref 12.0–16.0)
MEAN CORPUSCULAR HEMOGLOBIN: 30.4 pg (ref 26.0–34.0)
MEAN CORPUSCULAR VOLUME: 94 fL (ref 80.0–100.0)
MEAN PLATELET VOLUME: 8.2 fL (ref 7.0–10.0)
PLATELET COUNT: 18 10*9/L — ABNORMAL LOW (ref 150–440)
RED BLOOD CELL COUNT: 2.14 10*12/L — ABNORMAL LOW (ref 4.00–5.20)
RED CELL DISTRIBUTION WIDTH: 19.3 % — ABNORMAL HIGH (ref 12.0–15.0)

## 2018-04-08 LAB — BASIC METABOLIC PANEL
ANION GAP: 11 mmol/L (ref 9–15)
BLOOD UREA NITROGEN: 102 mg/dL — ABNORMAL HIGH (ref 7–21)
BUN / CREAT RATIO: 63
CALCIUM: 8.3 mg/dL — ABNORMAL LOW (ref 8.5–10.2)
CHLORIDE: 102 mmol/L (ref 98–107)
CO2: 26 mmol/L (ref 22.0–30.0)
CREATININE: 1.63 mg/dL — ABNORMAL HIGH (ref 0.60–1.00)
EGFR CKD-EPI AA FEMALE: 35 mL/min/{1.73_m2} — ABNORMAL LOW (ref >=60)
EGFR CKD-EPI NON-AA FEMALE: 30 mL/min/{1.73_m2} — ABNORMAL LOW (ref >=60)
SODIUM: 139 mmol/L (ref 135–145)

## 2018-04-08 LAB — CALCIUM: Calcium:MCnc:Pt:Ser/Plas:Qn:: 8.3 — ABNORMAL LOW

## 2018-04-08 LAB — LYMPHOCYTES - ABS (DIFF): Lab: 0.3 — ABNORMAL LOW

## 2018-04-08 LAB — PLATELET COUNT: Lab: 60 — ABNORMAL LOW

## 2018-04-08 LAB — URIC ACID: Urate:MCnc:Pt:Ser/Plas:Qn:: 7.4 — ABNORMAL HIGH

## 2018-04-08 LAB — HEMATOCRIT: Lab: 20.1 — ABNORMAL LOW

## 2018-04-08 LAB — MAGNESIUM: Magnesium:MCnc:Pt:Ser/Plas:Qn:: 1.8

## 2018-04-08 NOTE — Unmapped (Signed)
Patient is alert and oriented X 4. She complained of having shortness of breath that was not relieved with breathing treatment. O2 sat at 90% so increased her oxygen to 2 L/M via [place] and was able to relieve her symptoms. Hgb at 6.5, MD was notified and ordered one unit of PRBC which was transfused. Chest tube draining bloody drainage and was flushed as ordered. No acute distress noted. Will continue to monitor. Jessica Wilson

## 2018-04-08 NOTE — Unmapped (Signed)
Pulmonary Consult Service  Follow-Up Note      Primary Service:   Hematology / Oncology  Primary Service Attending:  Vito Berger, MD  Reason for Consult:   Hypoxia    ASSESSMENT and PLAN     Jessica Wilson is a 77 y.o. 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  She was treated with antibiotics and the initial admission when she had nodular ill-defined opacities bilaterally which disappeared and her repeat CT scan done a couple weeks later for worsening hypoxia however showed groundglass opacities of bilateral upper lobes with small nodular reticular shadows.  Given the circumstances, differentials including infection, leukemic infiltrates or differentiation syndrome were considered.  Bronchoscopy with BAL was performed and results did not show any infection so far.  She also had a pleural effusion which was small and was thought to initially not be the cause of her hypoxia.  However over Saturday and Sunday, she had dyspnea, and increasing pleural effusion for which a pigtail catheter was put in on Sunday.  She has been receiving a unit of blood and platelet almost every day or every other day.  Pleural fluid analysis shows an exudative fluid, however has more than 1 million RBCs, concerning for hemorrhagic effusion.  On our recommendations, the primary team got hematocrit on the pleural fluid, which is less than 50% of the hematocrit in the blood, making hemothorax less likely.  The pH on the pleural fluid is less than 7, concerning for empyema, however she does not have signs of fever or severe ongoing infection making it less likely.  Therefore in the setting of hemorrhagic effusion with a low pH, the chances of the effusion being malignant effusion arising from her leukemia is likely.    Currently her pleural fluid drainage is still more than 100 cc, total of 200 in the last 24 hours.  Serosanguineous in nature.  The chest wall site where the chest tube enters has bruise like appearance, does not look erythematous, and is mildly tender.  Chest superinfection or less given it is only been a few days and suggest he was put in.  However, the cytology is not yet back, and if it does show malignant cells, patient may benefit from a Pleurx catheter.  ??  At this time, would continue chest tube care, and if the pleural fluid drainage is less than 100 for 24 hours, we will plan to remove it.  Would recommend getting daily chest x-rays as well.    Recommendations:  -Daily chest x-ray  -Pigtail catheter drainage of less than 100, will plan to remove  -Incentive spirometry, out of bed to chair, wean oxygen as tolerated  -Plan pleurx catheter, clamp pigtail    Thank you for the opportunity to participate in this patient's care. Please do not hesitate to call the on-call pulmonary fellow at [phone] with any questions regarding her care.    This patient was seen and discussed with Dr. Leticia Clas.   Maia Plan, MD  PGY 4, Pulmonary and Critical Care  Pager: [phone]  April 08, 2018 3:44 PM     SUBJECTIVE:     Patient feels better, mentions that she is happy that she is only on half liter oxygen.  Has not walked around today yet.    Medications:     Scheduled Meds:  ??? acyclovir  400 mg Oral BID   ??? allopurinol  300 mg Oral BID   ???  budesonide-formoterol  2 puff Inhalation BID (RT)   ??? buPROPion  150 mg Oral BID   ??? [START ON 04/09/2018] diltiazem  240 mg Oral Daily   ??? diltiazem  60 mg Oral Once   ??? hydroxyurea  500 mg Oral BID   ??? insulin NPH  20 Units Subcutaneous BID AC   ??? insulin regular  0-12 Units Subcutaneous ACHS   ??? levoFLOXacin  250 mg Oral Q24H SCH   ??? magic mouthwash oral  10 mL Oral 4x Daily   ??? predniSONE  40 mg Oral Daily   ??? sertraline  100 mg Oral Daily   ??? sevelamer  1,600 mg Oral 3xd Meals   ??? sodium chloride  10 mL Intravenous BID     Continuous Infusions:  ??? sodium chloride 20 mL/hr (03/30/18 1600)   ??? sodium chloride     ??? sodium chloride     ??? sodium chloride       PRN Meds:.acetaminophen, albuterol, ipratropium **AND** albuterol, cetirizine, HYDROmorphone, loperamide, loperamide, phenol, prochlorperazine, sodium chloride     PHYSICAL EXAM:   BP 117/49  - Pulse 99  - Temp 36.5 ??C (Oral)  - Resp 24  - Ht 160 cm (5' 2.99)  - Wt 98 kg (216 lb 0.8 oz)  - SpO2 98%  - BMI 38.28 kg/m??     Gen: Patient awake, alert, oriented to time place and person, moderate pallor, no icterus  Eyes: Pupils equal round and reacting light bilaterally  Head neck ENT: No JVD, no lymphadenopathy-cervical or supraclavicular, no thyromegaly, moist mucosa, normal oropharynx  CVS: S1-S2 heard normally, no murmurs appreciated, no rubs or gallops  Respiratory: Chest wall site with the pigtail catheter enters appears slightly bruised, mildly sore.  Abdomen: Soft, nontender, nondistended, no organomegaly, bowel sounds present  Neuro: Nonfocal neurological exam, no sensory deficits, cranial nerves grossly intact  Skin extremities: No rash, no pedal edema  LABORATORY and RADIOLOGY DATA:     Pertinent Laboratory Data from the last 24 hours:  Lab Results   Component Value Date    WBC 1.4 (L) 04/08/2018    HGB 6.5 (L) 04/08/2018    HCT 20.1 (L) 04/08/2018    PLT 60 (L) 04/08/2018     Lab Results   Component Value Date    NA 139 04/08/2018    K 4.6 04/08/2018    CL 102 04/08/2018    CO2 26.0 04/08/2018    BUN 102 (H) 04/08/2018    CREATININE 1.63 (H) 04/08/2018    GLU 156 04/08/2018    CALCIUM 8.3 (L) 04/08/2018    MG 1.8 04/08/2018    PHOS 6.0 (H) 04/08/2018       Lab Results   Component Value Date    BILITOT 0.9 04/06/2018    BILIDIR 0.20 04/06/2018    PROT 5.5 (L) 04/06/2018    ALBUMIN 3.1 (L) 04/06/2018    ALT 16 04/06/2018    AST 9 (L) 04/06/2018    ALKPHOS 55 04/06/2018       Lab Results   Component Value Date    INR 1.23 03/15/2018    APTT 38.2 03/15/2018

## 2018-04-08 NOTE — Unmapped (Signed)
Daily Progress Note    Assessment/Plan:    Jessica Wilson is a 77 y.o. female who presented to [facility] with Acute and chronic respiratory failure with hypoxia (CMS-HCC) concerning for differentiation syndrome vs leukocytic infiltration vs pneumonia.    Hypoxic respiratory failure: Differential includes differentiation syndrome vs pulmonary leukocyte infiltration vs pneumonia. Pneumonia less likely given the lack of focal signs and recent history of appropriate antibiotic treatment, but fungal pneumonia still on the differential and she treated empirically treated with antifungals from 7/27-7/29. CT on 7/22 showed new b/l ground glass opacities and R-sided pleural effusion. VRP negative. BAL cx's no growth at 4 days. Thoracentesis consistent with exudative effusion. Chest tube with bloody drainage, pulm does not feel it is consistent with hemothorax. Has been on 1-2L since chest tube placement.   - Continue to wean O2 as tolerated  - Monitor chest tube output  - Continue 40 mg prednisone (day 4/5)  - Continue 500 mg levofloxacin (day 4/5)  - PRN albuterol nebulizers  - Continue Symbicort inhaler BID  - Holding diuresis 2/2 AKI    A-fib with RVR: Has a h/o A-fib and is on diltiazem at home. This morning she was tachycardic to the 130s with ECG significant for A-fib. This is most likely in the setting of her current hospital admission.   - Increase Diltiazem ER to 240 mg (up from home dose 180 mg) daily     - Continue 12.5 mg metoprolol q6h for rate control overnight.    AKI: Likely related to over-diuresis. With hyperkalemia, hyperphosphatemia, and elevated uric acid. ECG with no hyperkalemic changes. Cr improving over the past several days.  - 1600 mg Sevelamer TID  - 300 mg allopurinol BID    Hyperglycemia: Has required insulin in the setting of steroids. Will begin to wean insulin after discontinuing prednisone on 8/1.  - Continue 20 U NPH BID  - SSI    Advanced Care Planning: Upon admission she was DNR/DNI. Given her admission for acute hypoxic respiratory failure, we discussed her code status. She said she would be amenable to intubation and transfer to the MICU if there was a reversible cause of her respiratory failure. She was clear that she would not want chest compressions if she were to have cardiac arrest.     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 Malen Gauze as an outpatient as well as Dr. Donneta Romberg locally in [place]. Was on Enasidenib since 06/2017 and this was discontinued at admission. FLT3 negative.    - 300mg  allopurinol BID.  - Continue hydroxyurea 500 mg BID  - Daily BMP   - Daily CBC w/ diff  - Transfuse for Hgb <7 and platelets <10  -??Transfuse cryoprecipitate for fibrinogen <150 if bleeding or consumptive process??  - Continue acyclovir for prophylaxis  - Will discuss with Dr. Malen Gauze regarding future chemotherapy once myeloid mutation panel results.    ___________________________________________________________________    Subjective:  -Dyspnea improved after chest tube placement. Has been down to 1-2L during the day.  - 200 cc serosanguinous drainage in the past 24 hours.    Labs/Studies:  Labs and Studies from the last 24hrs per EMR and Reviewed   Uric acid: 9.5 (stable); Phos: 7.9 (down from 9.1); K:5 (down from 5.7); Cr: 2.02 (down from 2.4)         Objective:  Temp:  [36.4 ??C-36.8 ??C] 36.6 ??C  Heart Rate:  [75-126] 80  Resp:  [20-22] 20  BP: (102-137)/(38-75) 122/54  SpO2:  [92 %-  100 %] 95 %    Gen: Tired appearing, elderly lady resting comfortably in the bed; no acute distress  Card: RRR, no m/r/g, S1/S2 normal  Pulm: Mild bilateral wheezes improved from days prior; chest tube in place with small non-tender hematoma.  Abd: Non-distended  Neuro: Alert and conversational, moves all extremities equally.  Ext: No apparent edema.    Harrell Gave, MD  Neurology Resident, PGY-1  April 08, 2018 1:18 PM

## 2018-04-09 DIAGNOSIS — J9621 Acute and chronic respiratory failure with hypoxia: Principal | ICD-10-CM

## 2018-04-09 LAB — BASIC METABOLIC PANEL
ANION GAP: 11 mmol/L (ref 9–15)
BUN / CREAT RATIO: 64
CALCIUM: 8.3 mg/dL — ABNORMAL LOW (ref 8.5–10.2)
CHLORIDE: 105 mmol/L (ref 98–107)
CO2: 24 mmol/L (ref 22.0–30.0)
CREATININE: 1.3 mg/dL — ABNORMAL HIGH (ref 0.60–1.00)
EGFR CKD-EPI AA FEMALE: 46 mL/min/{1.73_m2} — ABNORMAL LOW (ref >=60)
EGFR CKD-EPI NON-AA FEMALE: 40 mL/min/{1.73_m2} — ABNORMAL LOW (ref >=60)
GLUCOSE RANDOM: 256 mg/dL — ABNORMAL HIGH (ref 65–179)
POTASSIUM: 4.1 mmol/L (ref 3.5–5.0)
SODIUM: 140 mmol/L (ref 135–145)

## 2018-04-09 LAB — MAGNESIUM: Magnesium:MCnc:Pt:Ser/Plas:Qn:: 1.8

## 2018-04-09 LAB — MANUAL DIFFERENTIAL
EOSINOPHILS - ABS (DIFF): 0 10*9/L (ref 0.0–0.4)
EOSINOPHILS - REL (DIFF): 0 %
LYMPHOCYTES - ABS (DIFF): 0.3 10*9/L — ABNORMAL LOW (ref 1.5–5.0)
LYMPHOCYTES - REL (DIFF): 37 %
MONOCYTES - ABS (DIFF): 0.2 10*9/L (ref 0.2–0.8)
MONOCYTES - REL (DIFF): 24 %
NEUTROPHILS - ABS (DIFF): 0.3 10*9/L — CL (ref 2.0–7.5)
NEUTROPHILS - REL (DIFF): 36 %

## 2018-04-09 LAB — HEPATIC FUNCTION PANEL
ALKALINE PHOSPHATASE: 42 U/L (ref 38–126)
ALT (SGPT): 17 U/L (ref 15–48)
AST (SGOT): 7 U/L — ABNORMAL LOW (ref 14–38)
BILIRUBIN TOTAL: 0.7 mg/dL (ref 0.0–1.2)

## 2018-04-09 LAB — CBC W/ AUTO DIFF
HEMATOCRIT: 21.9 % — ABNORMAL LOW (ref 36.0–46.0)
HEMOGLOBIN: 7.2 g/dL — ABNORMAL LOW (ref 12.0–16.0)
MEAN CORPUSCULAR HEMOGLOBIN CONC: 32.9 g/dL (ref 31.0–37.0)
MEAN CORPUSCULAR HEMOGLOBIN: 30.4 pg (ref 26.0–34.0)
MEAN CORPUSCULAR VOLUME: 92.4 fL (ref 80.0–100.0)
MEAN PLATELET VOLUME: 10 fL (ref 7.0–10.0)
PLATELET COUNT: 39 10*9/L — ABNORMAL LOW (ref 150–440)
RED BLOOD CELL COUNT: 2.37 10*12/L — ABNORMAL LOW (ref 4.00–5.20)
RED CELL DISTRIBUTION WIDTH: 19.6 % — ABNORMAL HIGH (ref 12.0–15.0)
WBC ADJUSTED: 0.8 10*9/L — ABNORMAL LOW (ref 4.5–11.0)

## 2018-04-09 LAB — BASOPHILS - ABS (DIFF): Lab: 0

## 2018-04-09 LAB — PHOSPHORUS: Phosphate:MCnc:Pt:Ser/Plas:Qn:: 4.4

## 2018-04-09 LAB — BUN / CREAT RATIO: Urea nitrogen/Creatinine:MRto:Pt:Ser/Plas:Qn:: 64

## 2018-04-09 LAB — BILIRUBIN TOTAL: Bilirubin:MCnc:Pt:Ser/Plas:Qn:: 0.7

## 2018-04-09 LAB — URIC ACID: Urate:MCnc:Pt:Ser/Plas:Qn:: 5.5

## 2018-04-09 LAB — HYPOCHROMIA

## 2018-04-09 MED ORDER — VENETOCLAX 100 MG TABLET
ORAL_TABLET | Freq: Every day | ORAL | 0 refills | 0.00000 days | Status: CP
Start: 2018-04-09 — End: 2018-04-09

## 2018-04-09 MED ORDER — VENETOCLAX 100 MG TABLET: 400 mg | tablet | Freq: Every day | 0 refills | 0 days | Status: AC

## 2018-04-09 NOTE — Unmapped (Signed)
Daily Progress Note    Assessment/Plan:    Jessica Wilson is a 77 y.o. female who presented to [facility] with Acute and chronic respiratory failure with hypoxia (CMS-HCC) concerning for differentiation syndrome vs leukocytic infiltration vs pneumonia.    Hypoxic respiratory failure: Most likely 2/2 pulmonary leukocyte infiltration. Pneumonia less likely given the lack of focal signs and recent history of appropriate antibiotic treatment, but fungal pneumonia still on the differential and she treated empirically treated with antifungals from 7/27-7/29. CT on 7/22 showed new b/l ground glass opacities and R-sided pleural effusion. VRP negative. BAL cx's no growth at 4 days. Thoracentesis consistent with exudative effusion. Chest tube with bloody drainage, pulm does not feel it is consistent with hemothorax. Has been on 1-2L since chest tube placement.   - Pleurex catheter today  - Continue to wean O2 as tolerated  - Monitor chest tube output  - Continue 40 mg prednisone (day 5/5)  - Continue 500 mg levofloxacin (day 5/5)  - PRN albuterol nebulizers  - Continue Symbicort inhaler BID  - Holding diuresis 2/2 AKI    A-fib with RVR: Has a h/o A-fib and is on diltiazem at home. This morning she was tachycardic to the 130s with ECG significant for A-fib. This is most likely in the setting of her current hospital admission.   - Start 25 metoprolol succinate BID. Concern for interaction between venetoclax and diltiazem.     AKI: Likely related to over-diuresis. With hyperkalemia, hyperphosphatemia, and elevated uric acid. ECG with no hyperkalemic changes. Cr improving over the past several days.  - 1600 mg Sevelamer TID  - 300 mg allopurinol BID    Hyperglycemia: Has required insulin in the setting of steroids. Will begin to wean insulin after discontinuing prednisone on 8/1.  - Increase to 25U NPH BID  - SSI    Advanced Care Planning: Upon admission she was DNR/DNI. Given her admission for acute hypoxic respiratory failure, we discussed her code status. She said she would be amenable to intubation and transfer to the MICU if there was a reversible cause of her respiratory failure. She was clear that she would not want chest compressions if she were to have cardiac arrest.     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 Malen Gauze as an outpatient as well as Dr. Donneta Romberg locally in [place]. Was on Enasidenib since 06/2017 and this was discontinued at admission. FLT3 negative.    - Plan for venetoclax as an outpatient. This is not a curative option, but might help improve her respiratory status.  - 300mg  allopurinol BID.  - Discontinue hydroxyurea.   - Daily BMP   - Daily CBC w/ diff  - Transfuse for Hgb <7 and platelets <10  -??Transfuse cryoprecipitate for fibrinogen <150 if bleeding or consumptive process??  - Continue acyclovir for prophylaxis  - Will discuss with Dr. Malen Gauze regarding future chemotherapy once myeloid mutation panel results.    ___________________________________________________________________    Subjective:  -Dyspnea improved after chest tube placement. Has been down to 1-2L during the day.      Labs/Studies:  Labs and Studies from the last 24hrs per EMR and Reviewed        Objective:  Temp:  [36.3 ??C-36.9 ??C] 36.7 ??C  Heart Rate:  [96-109] 96  Resp:  [22-26] 24  BP: (117-152)/(45-74) 124/53  SpO2:  [93 %-98 %] 97 %    Gen: Tired appearing, elderly lady resting comfortably in the bed; no acute distress  Card: RRR, no  m/r/g, S1/S2 normal  Pulm: Mild bilateral wheezes; chest tube in place with small non-tender hematoma.  Abd: Non-distended  Neuro: Alert and conversational, moves all extremities equally.  Ext: No apparent edema.    Harrell Gave, MD  Neurology Resident, PGY-1  April 09, 2018 1:17 PM

## 2018-04-09 NOTE — Unmapped (Signed)
Patient blood sugar lever was 400, and she was asymptomatic, given SSI as ordered. She complained of shortness with transfers from bed to bedside commode. Oxygen level increased to 2 L/M via [place]. Vital signs stable and she remained afebrile throughout the night. Will continue to monitor

## 2018-04-09 NOTE — Unmapped (Signed)
Jessica Wilson was alert and oriented x 4. Vital signs stable. Afebrile. Up with assist. Sitting in chair most of the day. Sister at bedside most of the day. Good spirits and happy throughout day, although curious when she will be getting her chest tube removed and about the plan of care going forward. Waiting to hear from team about plan. Denied pain, nausea and vomiting. No PRN medications needed. Medication administered as ordered. Remained on 1L [place] throughout the day with no reports of shortness of breath except DOE. PT order placed to enhance physical functioning per patients concern and desire to not return to rehab she came from before admission on 4 Onc. Emotional and educational support provided. Will continue to monitor and follow plan of care.

## 2018-04-09 NOTE — Unmapped (Signed)
Additional order received and the patient is already on the caseload. Continue with  plan of care.

## 2018-04-10 DIAGNOSIS — J9621 Acute and chronic respiratory failure with hypoxia: Principal | ICD-10-CM

## 2018-04-10 LAB — CBC W/ AUTO DIFF
HEMATOCRIT: 21.7 % — ABNORMAL LOW (ref 36.0–46.0)
HEMATOCRIT: 19.9 % — ABNORMAL LOW (ref 36.0–46.0)
HEMOGLOBIN: 7.1 g/dL — ABNORMAL LOW (ref 12.0–16.0)
HEMOGLOBIN: 6.5 g/dL — ABNORMAL LOW (ref 12.0–16.0)
MEAN CORPUSCULAR HEMOGLOBIN CONC: 32.7 g/dL (ref 31.0–37.0)
MEAN CORPUSCULAR HEMOGLOBIN CONC: 32.6 g/dL (ref 31.0–37.0)
MEAN CORPUSCULAR HEMOGLOBIN: 30.9 pg (ref 26.0–34.0)
MEAN CORPUSCULAR VOLUME: 93.7 fL (ref 80.0–100.0)
MEAN PLATELET VOLUME: 8.4 fL (ref 7.0–10.0)
MEAN PLATELET VOLUME: 10 fL (ref 7.0–10.0)
PLATELET COUNT: 22 10*9/L — ABNORMAL LOW (ref 150–440)
RED BLOOD CELL COUNT: 2.32 10*12/L — ABNORMAL LOW (ref 4.00–5.20)
RED BLOOD CELL COUNT: 2.1 10*12/L — ABNORMAL LOW (ref 4.00–5.20)
RED CELL DISTRIBUTION WIDTH: 18.9 % — ABNORMAL HIGH (ref 12.0–15.0)
WBC ADJUSTED: 0.9 10*9/L — ABNORMAL LOW (ref 4.5–11.0)
WBC ADJUSTED: 1 10*9/L — ABNORMAL LOW (ref 4.5–11.0)

## 2018-04-10 LAB — EOSINOPHILS - ABS (DIFF): Lab: 0

## 2018-04-10 LAB — MANUAL DIFFERENTIAL
BASOPHILS - ABS (DIFF): 0 10*9/L (ref 0.0–0.1)
BASOPHILS - ABS (DIFF): 0 10*9/L (ref 0.0–0.1)
BASOPHILS - REL (DIFF): 0 %
BASOPHILS - REL (DIFF): 0 %
BLASTS - REL (DIFF): 3 % (ref <=0)
EOSINOPHILS - ABS (DIFF): 0 10*9/L (ref 0.0–0.4)
EOSINOPHILS - REL (DIFF): 0 %
LYMPHOCYTES - ABS (DIFF): 0.5 10*9/L — ABNORMAL LOW (ref 1.5–5.0)
LYMPHOCYTES - REL (DIFF): 50 %
MONOCYTES - ABS (DIFF): 0.3 10*9/L (ref 0.2–0.8)
MONOCYTES - ABS (DIFF): 0.3 10*9/L (ref 0.2–0.8)
MONOCYTES - REL (DIFF): 29 %
NEUTROPHILS - ABS (DIFF): 0.2 10*9/L — CL (ref 2.0–7.5)
NEUTROPHILS - ABS (DIFF): 0.2 10*9/L — CL (ref 2.0–7.5)
NEUTROPHILS - REL (DIFF): 25 %

## 2018-04-10 LAB — BASIC METABOLIC PANEL
ANION GAP: 7 mmol/L — ABNORMAL LOW (ref 9–15)
BLOOD UREA NITROGEN: 57 mg/dL — ABNORMAL HIGH (ref 7–21)
CALCIUM: 8.8 mg/dL (ref 8.5–10.2)
CHLORIDE: 108 mmol/L — ABNORMAL HIGH (ref 98–107)
CO2: 26 mmol/L (ref 22.0–30.0)
CREATININE: 1.01 mg/dL — ABNORMAL HIGH (ref 0.60–1.00)
EGFR CKD-EPI AA FEMALE: 62 mL/min/{1.73_m2} (ref >=60)
EGFR CKD-EPI NON-AA FEMALE: 54 mL/min/{1.73_m2} — ABNORMAL LOW (ref >=60)
GLUCOSE RANDOM: 117 mg/dL (ref 65–179)
POTASSIUM: 4.1 mmol/L (ref 3.5–5.0)
SODIUM: 141 mmol/L (ref 135–145)

## 2018-04-10 LAB — PHOSPHORUS: Phosphate:MCnc:Pt:Ser/Plas:Qn:: 3.2

## 2018-04-10 LAB — MAGNESIUM: Magnesium:MCnc:Pt:Ser/Plas:Qn:: 1.9

## 2018-04-10 LAB — PLATELET COUNT
Lab: 46 — ABNORMAL LOW
PLATELET COUNT: 46 10*9/L — ABNORMAL LOW (ref 150–440)

## 2018-04-10 LAB — MEAN PLATELET VOLUME: Lab: 8.4

## 2018-04-10 LAB — URIC ACID: Urate:MCnc:Pt:Ser/Plas:Qn:: 4.2

## 2018-04-10 LAB — SODIUM: Sodium:SCnc:Pt:Ser/Plas:Qn:: 141

## 2018-04-10 LAB — HEMATOCRIT: Lab: 19.9 — ABNORMAL LOW

## 2018-04-10 LAB — MONOCYTES - ABS (DIFF): Lab: 0.3

## 2018-04-10 NOTE — Unmapped (Signed)
Jessica Wilson was alert and oriented x 4. Vital signs stable. Afebrile. Up with SBA. Denied pain, nausea and vomiting. Up in chair most of the day. Patient weaned down to room air, but only tolerated room air for less than one hour. The patient was hovering between 89-90% on room air. Patient then increased to 0.5L where she remained stable at 92-94% the rest of the shift. SOB in the afternoon - albuterol tx with good effect. Chest tube clamped per MD order. Medication administered as ordered. Sister at bedside most of the day. Remained in Afib. Went up to 160 HR for 30 seconds (resolved on own) while getting platelets, MD notified. Tolerated platelets well. Emotional and educational support provided. Will continue to monitor and follow plan of care.

## 2018-04-10 NOTE — Unmapped (Signed)
INTERVENTIONAL PULMONOLOGY FOLLOW-UP CONSULT NOTE    Assessment:     [name] is a 77 year old female with myelodysplastic syndrome which progressed to AML who was admitted due to hypoxic respiratory failure. She had increasing pleural effusion with significant dyspnea so a pigtail chest tube was placed on 04/05/18 with improvement in dyspnea. IP is consulted for evaluation of indwelling pleural catheter placement.    [name] is a good candidate for indwelling pleural catheter as she feels her dyspnea improved significantly and dyspnea is the main impairment to her daily activity. We will plan for PleurX catheter placement tomorrow, 04/10/18 and have asked the primary team to transfuse platelets to a goal of 50 prior to her procedure.     Plan:     1. Will plan for PleurX catheter placement tomorrow, 04/10/18.    2. Please make patient NPO after midnight.    3. Platelet goal prior to procedure is 50, please transfuse platelets to reach this goal on the morning of the procedure.    This patient was seen and evaluated with Dr. Curt Bears.    Please call the Interventional Pulmonary fellow at [phone] with any questions.    Ayona Yniguez R. Sharene Krikorian, DO  Interventional Pulmonary Fellow  Division of Pulmonary and Critical Care Medicine  Pager [phone]    Subjective:     S: [name] is a 77 year old female with MDS progressed to AML admitted with hypoxic respiratory failure. She has been treated for infection and was found to have increased pleural effusion with significant dyspnea so pigtail chest tube was placed on 04/05/18.     [name] reports significant improvement in dyspnea after chest tube placement. Her tube has drained approximately 100 mL per day for the last 2 days and has been clamped since evening of 7/31. She feels her dyspnea has worsened since the chest tube was clamped.     We discussed potential options for management of her pleural effusion including serial thoracentesis and placement of indwelling pleural catheter. She prefers to go home and be able to drain her effusions at home rather than return to the hospital for multiple thoracenteses. She has support from her husband and from hospice and is confident in her ability to drain a pleural catheter at home with help from her family.    Objective:     Physical Examination:     BP 132/47  - Pulse 124  - Temp 36.4 ??C (97.5 ??F) (Axillary)  - Resp 24  - Ht 160 cm (5' 2.99)  - Wt 98.4 kg (216 lb 14.9 oz)  - SpO2 97%  - BMI 38.44 kg/m??    General appearance - chronically ill appearing  Heart - normal rate, regular rhythm, normal S1, S2, no murmurs, rubs, clicks or gallops  Chest - Decreased air entry bilateral bases, no crackles or wheezes  Abdomen - soft, nontender, nondistended, no masses or organomegaly  Extremities - No pedal edema, no clubbing or cyanosis    Labs and Imaging:    Chest X-ray 04/09/18:   Unchanged lines and tubes.    Unchanged pulmonary edema.    Pleural effusions appear unchanged. No pneumothorax    Stable cardiomediastinal silhouette.         Impression       Essentially unchanged pulmonary edema and pleural effusions.

## 2018-04-10 NOTE — Unmapped (Signed)
Daily Progress Note    Assessment/Plan:    Jessica Wilson is a 77 y.o. female who presented to [facility] with Acute and chronic respiratory failure with hypoxia (CMS-HCC) concerning for differentiation syndrome vs leukocytic infiltration vs pneumonia.    Hypoxic respiratory failure: Most likely 2/2 pulmonary leukocyte infiltration. Pneumonia less likely given the lack of focal signs and recent history of appropriate antibiotic treatment, but fungal pneumonia still on the differential and she treated empirically treated with antifungals from 7/27-7/29. CT on 7/22 showed new b/l ground glass opacities and R-sided pleural effusion. VRP negative. BAL cx's no growth at 4 days. Thoracentesis consistent with exudative effusion. Chest tube with bloody drainage, pulm does not feel it is consistent with hemothorax. Has been on 1-2L since chest tube placement.   - Pleurex catheter Monday, target plt goal > 50  - Continue to wean O2 as tolerated  - Keep chest tube clamped  - Discontinue 40 mg prednisone - completed 5 days  - Discontinue 500 mg levofloxacin - completed 5 days  - PRN albuterol nebulizers  - Continue Symbicort inhaler BID  - Holding diuresis 2/2 AKI    A-fib with RVR: Has a h/o A-fib and is on diltiazem at home. This morning she was tachycardic to the 130s with ECG significant for A-fib. This is most likely in the setting of her current hospital admission.   - Start 25 metoprolol succinate BID. Concern for interaction between venetoclax and diltiazem.     AKI: Likely related to over-diuresis. With hyperkalemia, hyperphosphatemia, and elevated uric acid. ECG with no hyperkalemic changes. Cr improving over the past several days.  - 800 mg Sevelamer TID  - 300 mg allopurinol BID    Hyperglycemia: Has required insulin in the setting of steroids. Will begin to wean insulin after discontinuing prednisone on 8/1.  - Decrease to 15U NPH BID  - SSI    Advanced Care Planning: Upon admission she was DNR/DNI. Given her admission for acute hypoxic respiratory failure, we discussed her code status. She said she would be amenable to intubation and transfer to the MICU if there was a reversible cause of her respiratory failure. She was clear that she would not want chest compressions if she were to have cardiac arrest.     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 Malen Gauze as an outpatient as well as Dr. Donneta Romberg locally in [place]. Was on Enasidenib since 06/2017 and this was discontinued at admission. FLT3 negative.    - Plan for venetoclax as an outpatient. This is not a curative option, but might help improve her respiratory status.  - 300mg  allopurinol BID.  - Off hydroxyurea.   - Daily BMP   - Daily CBC w/ diff  - Transfuse for Hgb <7 and platelets <10  -??Transfuse cryoprecipitate for fibrinogen <150 if bleeding or consumptive process??  - Continue acyclovir for prophylaxis  - Will discuss with Dr. Malen Gauze regarding future chemotherapy once myeloid mutation panel results.    ___________________________________________________________________    Subjective:  [name]    Labs/Studies:  Labs and Studies from the last 24hrs per EMR and Reviewed        Objective:  Temp:  [36.4 ??C-37 ??C] 36.7 ??C  Heart Rate:  [62-124] 118  Resp:  [18-24] 19  BP: (118-152)/(47-88) 118/56  SpO2:  [95 %-100 %] 97 %    Gen: Tired appearing, elderly lady resting comfortably in the bed; no acute distress  Pulm: Continues on nasal cannula  Abd: Non-distended  Neuro: Drowsy this morning but arousable, moves all extremities equally.  Ext: No apparent edema.

## 2018-04-10 NOTE — Unmapped (Signed)
Patient has dyspnea with transfers from bed to bedside commode.  On O2 @ 1 L/M via [place] with O2 sats above 90. She has been NPO since midnight for PleurX placement today. Has a chest tube that has been clamped. She has been afebrile throughout the night.She had episodes of elevated and irregular pulse, MD was notified and 12 lead EKG was ordered. Will continue to monitor.

## 2018-04-11 DIAGNOSIS — J9621 Acute and chronic respiratory failure with hypoxia: Principal | ICD-10-CM

## 2018-04-11 LAB — CBC W/ AUTO DIFF
HEMATOCRIT: 25.2 % — ABNORMAL LOW (ref 36.0–46.0)
MEAN CORPUSCULAR HEMOGLOBIN CONC: 32.3 g/dL (ref 31.0–37.0)
MEAN CORPUSCULAR HEMOGLOBIN: 30.2 pg (ref 26.0–34.0)
MEAN PLATELET VOLUME: 8.3 fL (ref 7.0–10.0)
PLATELET COUNT: 20 10*9/L — ABNORMAL LOW (ref 150–440)
RED CELL DISTRIBUTION WIDTH: 17.8 % — ABNORMAL HIGH (ref 12.0–15.0)
WBC ADJUSTED: 0.8 10*9/L — ABNORMAL LOW (ref 4.5–11.0)

## 2018-04-11 LAB — BASIC METABOLIC PANEL
ANION GAP: 5 mmol/L — ABNORMAL LOW (ref 9–15)
BLOOD UREA NITROGEN: 41 mg/dL — ABNORMAL HIGH (ref 7–21)
BUN / CREAT RATIO: 43
CHLORIDE: 108 mmol/L — ABNORMAL HIGH (ref 98–107)
CO2: 28 mmol/L (ref 22.0–30.0)
CREATININE: 0.96 mg/dL (ref 0.60–1.00)
EGFR CKD-EPI AA FEMALE: 66 mL/min/{1.73_m2} (ref >=60)
EGFR CKD-EPI NON-AA FEMALE: 58 mL/min/{1.73_m2} — ABNORMAL LOW (ref >=60)
GLUCOSE RANDOM: 104 mg/dL (ref 65–179)
POTASSIUM: 4.2 mmol/L (ref 3.5–5.0)
SODIUM: 141 mmol/L (ref 135–145)

## 2018-04-11 LAB — MANUAL DIFFERENTIAL
BASOPHILS - ABS (DIFF): 0 10*9/L (ref 0.0–0.1)
BASOPHILS - REL (DIFF): 0 %
EOSINOPHILS - ABS (DIFF): 0 10*9/L (ref 0.0–0.4)
LYMPHOCYTES - ABS (DIFF): 0.5 10*9/L — ABNORMAL LOW (ref 1.5–5.0)
LYMPHOCYTES - REL (DIFF): 59 %
MONOCYTES - ABS (DIFF): 0.2 10*9/L (ref 0.2–0.8)
MONOCYTES - REL (DIFF): 20 %
NEUTROPHILS - REL (DIFF): 21 %

## 2018-04-11 LAB — BUN / CREAT RATIO: Urea nitrogen/Creatinine:MRto:Pt:Ser/Plas:Qn:: 43

## 2018-04-11 LAB — URIC ACID: Urate:MCnc:Pt:Ser/Plas:Qn:: 3.3

## 2018-04-11 LAB — PHOSPHORUS: Phosphate:MCnc:Pt:Ser/Plas:Qn:: 3.1

## 2018-04-11 LAB — MEAN CORPUSCULAR HEMOGLOBIN CONC: Lab: 32.3

## 2018-04-11 LAB — NEUTROPHILS - REL (DIFF): Lab: 21

## 2018-04-11 LAB — MAGNESIUM: Magnesium:MCnc:Pt:Ser/Plas:Qn:: 1.8

## 2018-04-11 NOTE — Unmapped (Signed)
Daily Progress Note    Assessment/Plan:    Jessica Wilson is a 77 y.o. female who presented to [facility] with Acute and chronic respiratory failure with hypoxia (CMS-HCC) concerning for differentiation syndrome vs leukocytic infiltration vs pneumonia.    Hypoxic respiratory failure: Most likely 2/2 pulmonary leukocyte infiltration. Pneumonia less likely given the lack of focal signs and recent history of appropriate antibiotic treatment, but fungal pneumonia still on the differential and she treated empirically treated with antifungals from 7/27-7/29. CT on 7/22 showed new b/l ground glass opacities and R-sided pleural effusion. VRP negative. BAL cx's no growth at 4 days. Thoracentesis consistent with exudative effusion. Chest tube with bloody drainage, pulm does not feel it is consistent with hemothorax. Has been on 1-2L since chest tube placement. Completed 5 day course of prednisone and levofloxacin for possible COPD exacerbation.  - Pleurex catheter Monday, target plt goal > 50  - Continue to wean O2 as tolerated  - Keep chest tube clamped  - PRN albuterol nebulizers  - Continue Symbicort inhaler BID    A-fib with RVR: Has a h/o A-fib and is on diltiazem at home. This morning she was tachycardic to the 130s with ECG significant for A-fib. This is most likely in the setting of her current hospital admission.   - Continue 25 metoprolol succinate BID. Concern for interaction between venetoclax and diltiazem.     AKI: Likely related to over-diuresis. With hyperkalemia, hyperphosphatemia, and elevated uric acid. ECG with no hyperkalemic changes. Cr improving over the past several days.  - 800 mg Sevelamer TID  - 300 mg allopurinol BID    Hyperglycemia: Has required insulin in the setting of steroids. Will begin to wean insulin after discontinuing prednisone on 8/1.  - Decrease to 10U NPH BID  - SSI    Advanced Care Planning: Upon admission she was DNR/DNI. Given her admission for acute hypoxic respiratory failure, we discussed her code status. She said she would be amenable to intubation and transfer to the MICU if there was a reversible cause of her respiratory failure. She was clear that she would not want chest compressions if she were to have cardiac arrest.     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 Malen Gauze as an outpatient as well as Dr. Donneta Romberg locally in [place]. Was on Enasidenib since 06/2017 and this was discontinued at admission. FLT3 negative.    - Plan for venetoclax as an outpatient. This is not a curative option, but might help improve her respiratory status.  - 300mg  allopurinol BID.  - Off hydroxyurea.   - Daily BMP   - Daily CBC w/ diff  - Transfuse for Hgb <7 and platelets <10  -??Transfuse cryoprecipitate for fibrinogen <150 if bleeding or consumptive process??  - Continue acyclovir for prophylaxis  - Will discuss with Dr. Malen Gauze regarding future chemotherapy once myeloid mutation panel results.    ___________________________________________________________________    Subjective:  - Breathing comfortably on 1L during the day    Labs/Studies:  Labs and Studies from the last 24hrs per EMR and Reviewed        Objective:  Temp:  [36.7 ??C-37.3 ??C] 37.3 ??C  Heart Rate:  [67-118] 114  Resp:  [18-24] 24  BP: (88-158)/(52-82) 88/52  SpO2:  [95 %-98 %] 95 %    Gen: Tired appearing, elderly lady resting comfortably in the chair; no acute distress  Pulm: Mild diffuse expiratory wheezes, no respiratory distress  Abd: Non-distended  Neuro: Awake with appropriate conversation,  moves all extremities equally.  Ext: No apparent edema.    Harrell Gave, MD  Neurology Resident, PGY-1  April 11, 2018 11:44 AM

## 2018-04-11 NOTE — Unmapped (Signed)
INTERVENTIONAL PULMONOLOGY FOLLOW-UP CONSULT NOTE    Assessment:     [name] is a 77 year old female with myelodysplastic syndrome which progressed to AML who was admitted due to hypoxic respiratory failure. She had increasing pleural effusion with significant dyspnea so a pigtail chest tube was placed on 04/05/18 with improvement in dyspnea. IP is consulted for evaluation of indwelling pleural catheter placement.    [name] is a good candidate for indwelling pleural catheter placement, unfortunately her platelet count was not high enough for safe placement today. We will plan for PleurX catheter placement 04/13/18 and asked the primary team to transfuse platelets to a goal of 50 the morning of the procedure.     Plan:     1. Plan for PleurX catheter placement 04/13/18: please transfuse platelets to at least 50 by 8 AM on that date.    2. Please make patient NPO after midnight prior to procedure.    Please call the Interventional Pulmonary fellow at [phone] with any questions.    Clemie General R. Maple Mirza, DO  Interventional Pulmonary Fellow  Division of Pulmonary and Critical Care Medicine  Pager [phone]    Subjective:     S: [name] complains of some dyspnea with exertion but says her breathing is doing well overall. She is agreeable to PleurX placement on Monday considering her platelet count was too low this morning.    Objective:     Physical Examination:     BP 155/80  - Pulse 67  - Temp 36.8 ??C (98.2 ??F) (Oral)  - Resp 20  - Ht 160 cm (5' 2.99)  - Wt 98.4 kg (216 lb 14.9 oz)  - SpO2 98%  - BMI 38.44 kg/m??    General appearance - oriented to person, place, and time  Heart - normal rate, regular rhythm, normal S1, S2, no murmurs, rubs, clicks or gallops  Chest - Decreased air entry bilateral bases, no wheezes  Abdomen - soft, nontender, nondistended, no masses or organomegaly  Extremities - No pedal edema, no clubbing or cyanosis    Labs and Imaging:    Chest X-ray 04/10/18: Unchanged lines and tubes. Mild bilateral patchy opacities unchanged.    Bilateral trace pleural effusions unchanged. No pneumothorax Stable cardiomediastinal silhouette.

## 2018-04-11 NOTE — Unmapped (Signed)
Afebrile, VSS. PRN Nebs treatment used once today for exp wheezing with some relief. Pt has chest tube in place clamped as ordered. ACHS. Pt denies any pain so far. Pt's sis by the bedside. WCTM.  Problem: Adult Inpatient Plan of Care  Goal: Plan of Care Review  Outcome: Progressing  Goal: Patient-Specific Goal (Individualization)  Outcome: Progressing  Goal: Absence of Hospital-Acquired Illness or Injury  Outcome: Progressing  Goal: Optimal Comfort and Wellbeing  Outcome: Progressing  Goal: Readiness for Transition of Care  Outcome: Progressing  Goal: Rounds/Family Conference  Outcome: Progressing     Problem: Self-Care Deficit  Goal: Improved Ability to Complete Activities of Daily Living  Outcome: Progressing     Problem: Fall Injury Risk  Goal: Absence of Fall and Fall-Related Injury  Outcome: Progressing     Problem: COPD Comorbidity  Goal: Maintenance of COPD Symptom Control  Outcome: Progressing     Problem: Hypertension Comorbidity  Goal: Blood Pressure in Desired Range  Outcome: Progressing     Problem: Obstructive Sleep Apnea Risk or Actual (Comorbidity Management)  Goal: Unobstructed Breathing During Sleep  Outcome: Progressing     Problem: Pain Chronic (Persistent) (Comorbidity Management)  Goal: Acceptable Pain Control and Functional Ability  Outcome: Progressing     Problem: Skin Injury Risk Increased  Goal: Skin Health and Integrity  Outcome: Progressing

## 2018-04-11 NOTE — Unmapped (Signed)
Afebrile, VSS. Pt did not have the procedure today. Chest tube in place and clamped as ordered. ACHS. Pt to get 1 unit of PRBC, premedicated with Zyrtec. WCTM the pt more closely.  Problem: Adult Inpatient Plan of Care  Goal: Plan of Care Review  Outcome: Progressing  Goal: Patient-Specific Goal (Individualization)  Outcome: Progressing  Goal: Absence of Hospital-Acquired Illness or Injury  Outcome: Progressing  Goal: Optimal Comfort and Wellbeing  Outcome: Progressing  Goal: Readiness for Transition of Care  Outcome: Progressing  Goal: Rounds/Family Conference  Outcome: Progressing     Problem: Self-Care Deficit  Goal: Improved Ability to Complete Activities of Daily Living  Outcome: Progressing     Problem: Fall Injury Risk  Goal: Absence of Fall and Fall-Related Injury  Outcome: Progressing     Problem: COPD Comorbidity  Goal: Maintenance of COPD Symptom Control  Outcome: Progressing     Problem: Hypertension Comorbidity  Goal: Blood Pressure in Desired Range  Outcome: Progressing     Problem: Obstructive Sleep Apnea Risk or Actual (Comorbidity Management)  Goal: Unobstructed Breathing During Sleep  Outcome: Progressing     Problem: Pain Chronic (Persistent) (Comorbidity Management)  Goal: Acceptable Pain Control and Functional Ability  Outcome: Progressing     Problem: Skin Injury Risk Increased  Goal: Skin Health and Integrity  Outcome: Progressing

## 2018-04-11 NOTE — Unmapped (Signed)
Patient has a chest tube that is clamped. She denies pain but has dyspnea with exertion while on 2 L/M via [place]. Scheduled to have Pleurex placed on Monday morning. No acute distress noted. She remained afebrile throughout the night. Will continue to monitor.

## 2018-04-12 DIAGNOSIS — J9621 Acute and chronic respiratory failure with hypoxia: Principal | ICD-10-CM

## 2018-04-12 LAB — ANION GAP: Anion gap 3:SCnc:Pt:Ser/Plas:Qn:: 7 — ABNORMAL LOW

## 2018-04-12 LAB — BASIC METABOLIC PANEL
ANION GAP: 7 mmol/L — ABNORMAL LOW (ref 9–15)
BLOOD UREA NITROGEN: 35 mg/dL — ABNORMAL HIGH (ref 7–21)
CALCIUM: 8.1 mg/dL — ABNORMAL LOW (ref 8.5–10.2)
CHLORIDE: 106 mmol/L (ref 98–107)
CO2: 24 mmol/L (ref 22.0–30.0)
CREATININE: 0.91 mg/dL (ref 0.60–1.00)
EGFR CKD-EPI AA FEMALE: 71 mL/min/{1.73_m2} (ref >=60)
EGFR CKD-EPI NON-AA FEMALE: 61 mL/min/{1.73_m2} (ref >=60)
GLUCOSE RANDOM: 212 mg/dL — ABNORMAL HIGH (ref 65–179)
POTASSIUM: 4 mmol/L (ref 3.5–5.0)
SODIUM: 137 mmol/L (ref 135–145)

## 2018-04-12 LAB — CBC W/ AUTO DIFF
BASOPHILS ABSOLUTE COUNT: 0 10*9/L (ref 0.0–0.1)
BASOPHILS RELATIVE PERCENT: 0.3 %
EOSINOPHILS ABSOLUTE COUNT: 0 10*9/L (ref 0.0–0.4)
EOSINOPHILS RELATIVE PERCENT: 0.3 %
HEMATOCRIT: 22.5 % — ABNORMAL LOW (ref 36.0–46.0)
HEMOGLOBIN: 7.3 g/dL — ABNORMAL LOW (ref 12.0–16.0)
LYMPHOCYTES ABSOLUTE COUNT: 0.4 10*9/L — ABNORMAL LOW (ref 1.5–5.0)
LYMPHOCYTES RELATIVE PERCENT: 49 %
MEAN CORPUSCULAR HEMOGLOBIN CONC: 32.4 g/dL (ref 31.0–37.0)
MEAN CORPUSCULAR HEMOGLOBIN: 30.8 pg (ref 26.0–34.0)
MEAN CORPUSCULAR VOLUME: 95 fL (ref 80.0–100.0)
MEAN PLATELET VOLUME: 9.9 fL (ref 7.0–10.0)
MONOCYTES ABSOLUTE COUNT: 0.1 10*9/L — ABNORMAL LOW (ref 0.2–0.8)
MONOCYTES RELATIVE PERCENT: 7.4 %
NEUTROPHILS ABSOLUTE COUNT: 0.3 10*9/L — CL (ref 2.0–7.5)
NEUTROPHILS RELATIVE PERCENT: 30.8 %
PLATELET COUNT: 23 10*9/L — ABNORMAL LOW (ref 150–440)
RED BLOOD CELL COUNT: 2.37 10*12/L — ABNORMAL LOW (ref 4.00–5.20)
RED CELL DISTRIBUTION WIDTH: 18 % — ABNORMAL HIGH (ref 12.0–15.0)
WBC ADJUSTED: 0.8 10*9/L — ABNORMAL LOW (ref 4.5–11.0)

## 2018-04-12 LAB — URIC ACID: Urate:MCnc:Pt:Ser/Plas:Qn:: 2.4 — ABNORMAL LOW

## 2018-04-12 LAB — MAGNESIUM: Magnesium:MCnc:Pt:Ser/Plas:Qn:: 1.5 — ABNORMAL LOW

## 2018-04-12 LAB — HEPATIC FUNCTION PANEL
ALKALINE PHOSPHATASE: 59 U/L (ref 38–126)
ALT (SGPT): 17 U/L (ref 15–48)
AST (SGOT): 7 U/L — ABNORMAL LOW (ref 14–38)
PROTEIN TOTAL: 4.9 g/dL — ABNORMAL LOW (ref 6.5–8.3)

## 2018-04-12 LAB — PHOSPHORUS: Phosphate:MCnc:Pt:Ser/Plas:Qn:: 2.8 — ABNORMAL LOW

## 2018-04-12 LAB — WBC ADJUSTED: Lab: 0.8 — ABNORMAL LOW

## 2018-04-12 LAB — PROTEIN TOTAL: Protein:MCnc:Pt:Ser/Plas:Qn:: 4.9 — ABNORMAL LOW

## 2018-04-12 LAB — SMEAR REVIEW

## 2018-04-12 NOTE — Unmapped (Signed)
Home O2 Evaluation - 6 minute walk.    SPO2 room air at rest in recliner:   94%  SPO2 while ambulating with walker:  90% (4 minutes)    Patient was not able to complete a full 6 minute walk due to HR > 130 and feeling faint.   Patient's O2 never fell below 90%.  Patient requested to reapply her nasal cannula at 2L when she returned to her room as she felt winded.

## 2018-04-12 NOTE — Unmapped (Signed)
INTERVENTIONAL PULMONOLOGY FOLLOW-UP CONSULT NOTE    Assessment:     [name] is a 77 year old female with myelodysplastic syndrome which progressed to AML who was admitted due to hypoxemic respiratory failure. She had increasing R pleural effusion with significant dyspnea so a pigtail chest tube was placed on 04/05/18 with improvement in dyspnea. IP was consulted for evaluation of indwelling pleural catheter placement.    [name] is a good candidate for indwelling pleural catheter placement, unfortunately her platelet count was not high enough for safe placement on 8/2. We will plan for PleurX catheter placement 04/13/18 and asked the primary team to transfuse platelets to a goal of 50 the morning of the procedure and to ensure she is NPO on 04/13/18 for the procedure.     Plan:     1. Please keep the pigtail chest catheter clamped.  Only drain a small amount of fluid (250cc) under the following circumstances: increasing oxygen requirement, respiratory distress, and objective increase in pleural effusion on ultrasound or CXR.  Any further drainage of fluid could postpone PleurX placement date and/or make the procedure more difficult.    2. Plan for PleurX catheter placement 04/13/18: please transfuse platelets to at least 50 by 8 AM on that date.    3. Please make patient NPO after midnight prior to procedure.    Please call the Interventional Pulmonary fellow at [phone] with any questions.  Patient was seen and discussed with Dr. Maple Mirza.    Malachi Carl MD  Pulmonary & Critical Care Fellow  Pager [phone]  April 12, 2018     Subjective:     S: [name] complains of some dyspnea, feels that it is worse in the past 24 hours.  She noticed this while trying to lay down last night.  She remains on 2L of oxygen at rest with SpO2 > 94%.      Objective:     Physical Examination:     BP 133/86  - Pulse 104  - Temp 36.7 ??C (Oral)  - Resp 20  - Ht 160 cm (5' 2.99)  - Wt 98.2 kg (216 lb 7.9 oz)  - SpO2 96%  - BMI 38.36 kg/m??    General appearance - oriented to person, place, and time  Heart - normal rate, regular rhythm, normal S1, S2, no murmurs, rubs, clicks or gallops  Chest - Diminished breath sounds at R base up to mid lung fields, decreased air entry bilateral bases, no wheezes.  Bedside ultrasound with stable appearance of R pleural effusion.  Abdomen - soft, nontender, nondistended, no masses or organomegaly  Extremities - No pedal edema, no clubbing or cyanosis    Labs and Imaging:    Xr Chest Portable    Result Date: 04/12/2018  EXAM: XR CHEST PORTABLE DATE: 04/12/2018 9:44 AM ACCESSION: [id number] UN DICTATED: 04/12/2018 10:56 AM INTERPRETATION LOCATION: [facility]     CLINICAL INDICATION: 77 years old Female - PLEURAL EFFUSION      COMPARISON: Chest radiograph dated 04/11/2018.     TECHNIQUE: Portable AP chest radiograph obtained at 0910 hours.     FINDINGS: Support devices, cardiomediastinal silhouette, and lungs (right pleural effusion with bibasilar atelectasis) are unchanged.                 No interval change.    Xr Chest Portable    Result Date: 04/11/2018  EXAM: XR CHEST PORTABLE DATE: 04/11/2018 9:20 AM ACCESSION: [id number] UN DICTATED: 04/11/2018 9:46 AM INTERPRETATION LOCATION: Main  Camp[facility]     CLINICAL INDICATION: 77 years old Female - PLEURAL EFFUSION      COMPARISON: Chest radiograph dated 04/10/2018.     TECHNIQUE: Portable AP chest radiograph obtained at 0907 hours.     FINDINGS: Right chest tube and left internal jugular Port-A-Cath, cardiomediastinal silhouette, and lungs (small right pleural effusion and basilar atelectasis) are unchanged.                 No interval change.

## 2018-04-13 ENCOUNTER — Telehealth: Payer: Self-pay | Admitting: *Deleted

## 2018-04-13 ENCOUNTER — Other Ambulatory Visit: Payer: Self-pay | Admitting: *Deleted

## 2018-04-13 DIAGNOSIS — J9621 Acute and chronic respiratory failure with hypoxia: Principal | ICD-10-CM

## 2018-04-13 DIAGNOSIS — D46Z Other myelodysplastic syndromes: Secondary | ICD-10-CM

## 2018-04-13 LAB — MANUAL DIFFERENTIAL
BASOPHILS - ABS (DIFF): 0 10*9/L (ref 0.0–0.1)
BASOPHILS - REL (DIFF): 0 %
EOSINOPHILS - ABS (DIFF): 0 10*9/L (ref 0.0–0.4)
EOSINOPHILS - REL (DIFF): 1 %
LYMPHOCYTES - REL (DIFF): 55 %
MONOCYTES - ABS (DIFF): 0.3 10*9/L (ref 0.2–0.8)
NEUTROPHILS - ABS (DIFF): 0.2 10*9/L — CL (ref 2.0–7.5)
NEUTROPHILS - REL (DIFF): 16 %

## 2018-04-13 LAB — BASIC METABOLIC PANEL
ANION GAP: 6 mmol/L — ABNORMAL LOW (ref 9–15)
BLOOD UREA NITROGEN: 27 mg/dL — ABNORMAL HIGH (ref 7–21)
BUN / CREAT RATIO: 31
CHLORIDE: 108 mmol/L — ABNORMAL HIGH (ref 98–107)
CO2: 26 mmol/L (ref 22.0–30.0)
CREATININE: 0.87 mg/dL (ref 0.60–1.00)
EGFR CKD-EPI AA FEMALE: 75 mL/min/{1.73_m2} (ref >=60)
EGFR CKD-EPI NON-AA FEMALE: 65 mL/min/{1.73_m2} (ref >=60)
GLUCOSE RANDOM: 97 mg/dL (ref 65–179)
POTASSIUM: 4 mmol/L (ref 3.5–5.0)

## 2018-04-13 LAB — CBC W/ AUTO DIFF
HEMATOCRIT: 20.6 % — ABNORMAL LOW (ref 36.0–46.0)
HEMOGLOBIN: 6.8 g/dL — ABNORMAL LOW (ref 12.0–16.0)
MEAN CORPUSCULAR HEMOGLOBIN CONC: 33 g/dL (ref 31.0–37.0)
MEAN CORPUSCULAR HEMOGLOBIN: 31.2 pg (ref 26.0–34.0)
MEAN CORPUSCULAR VOLUME: 94.5 fL (ref 80.0–100.0)
MEAN PLATELET VOLUME: 9.2 fL (ref 7.0–10.0)
NUCLEATED RED BLOOD CELLS: 2 /100{WBCs} (ref <=4)
RED BLOOD CELL COUNT: 2.18 10*12/L — ABNORMAL LOW (ref 4.00–5.20)
RED CELL DISTRIBUTION WIDTH: 17.8 % — ABNORMAL HIGH (ref 12.0–15.0)
WBC ADJUSTED: 1 10*9/L — ABNORMAL LOW (ref 4.5–11.0)

## 2018-04-13 LAB — PHOSPHORUS: Phosphate:MCnc:Pt:Ser/Plas:Qn:: 3.1

## 2018-04-13 LAB — TOXIC VACUOLATION

## 2018-04-13 LAB — PLATELET COUNT
Lab: 30 — ABNORMAL LOW
Lab: 32 — ABNORMAL LOW

## 2018-04-13 LAB — EGFR CKD-EPI AA FEMALE: Lab: 75

## 2018-04-13 LAB — URIC ACID: Urate:MCnc:Pt:Ser/Plas:Qn:: 2.4 — ABNORMAL LOW

## 2018-04-13 LAB — MEAN CORPUSCULAR HEMOGLOBIN CONC: Lab: 33

## 2018-04-13 LAB — MAGNESIUM: Magnesium:MCnc:Pt:Ser/Plas:Qn:: 1.9

## 2018-04-13 MED ORDER — BUDESONIDE-FORMOTEROL FUMARATE 80-4.5 MCG/ACT IN AERO
2.00 | INHALATION_SPRAY | RESPIRATORY_TRACT | Status: DC
Start: 2018-04-16 — End: 2018-04-13

## 2018-04-13 MED ORDER — GENERIC EXTERNAL MEDICATION
10.00 | Status: DC
Start: 2018-04-16 — End: 2018-04-13

## 2018-04-13 MED ORDER — ACETAMINOPHEN 325 MG PO TABS
650.00 | ORAL_TABLET | ORAL | Status: DC
Start: ? — End: 2018-04-13

## 2018-04-13 MED ORDER — ALLOPURINOL 300 MG PO TABS
300.00 | ORAL_TABLET | ORAL | Status: DC
Start: 2018-04-17 — End: 2018-04-13

## 2018-04-13 MED ORDER — INSULIN NPH (HUMAN) (ISOPHANE) 100 UNIT/ML ~~LOC~~ SUSP
5.00 | SUBCUTANEOUS | Status: DC
Start: 2018-04-13 — End: 2018-04-13

## 2018-04-13 MED ORDER — METOPROLOL SUCCINATE ER 25 MG PO TB24
25.00 | ORAL_TABLET | ORAL | Status: DC
Start: 2018-04-13 — End: 2018-04-13

## 2018-04-13 MED ORDER — GENERIC EXTERNAL MEDICATION
Status: DC
Start: ? — End: 2018-04-13

## 2018-04-13 MED ORDER — SODIUM CHLORIDE 0.9 % IV SOLN
INTRAVENOUS | Status: DC
Start: ? — End: 2018-04-13

## 2018-04-13 MED ORDER — HYDROMORPHONE HCL 1 MG/ML IJ SOLN
0.20 | INTRAMUSCULAR | Status: DC
Start: ? — End: 2018-04-13

## 2018-04-13 MED ORDER — PHENOL 1.4 % MT LIQD
2.00 | OROMUCOSAL | Status: DC
Start: ? — End: 2018-04-13

## 2018-04-13 MED ORDER — LEVOFLOXACIN 500 MG PO TABS
500.00 | ORAL_TABLET | ORAL | Status: DC
Start: 2018-04-17 — End: 2018-04-13

## 2018-04-13 MED ORDER — INSULIN REGULAR HUMAN 100 UNIT/ML IJ SOLN
0.00 | INTRAMUSCULAR | Status: DC
Start: 2018-04-13 — End: 2018-04-13

## 2018-04-13 MED ORDER — ALBUTEROL SULFATE (2.5 MG/3ML) 0.083% IN NEBU
2.50 | INHALATION_SOLUTION | RESPIRATORY_TRACT | Status: DC
Start: ? — End: 2018-04-13

## 2018-04-13 MED ORDER — SALINE NASAL SPRAY 0.65 % NA SOLN
1.00 | NASAL | Status: DC
Start: ? — End: 2018-04-13

## 2018-04-13 MED ORDER — MELATONIN 3 MG PO TABS
3.00 | ORAL_TABLET | ORAL | Status: DC
Start: 2018-04-16 — End: 2018-04-13

## 2018-04-13 NOTE — Unmapped (Signed)
Daily Progress Note    Assessment/Plan:    Jessica Wilson is a 77 y.o. female who presented to [facility] with Acute and chronic respiratory failure with hypoxia (CMS-HCC) concerning for differentiation syndrome vs leukocytic infiltration vs pneumonia.    Hypoxic respiratory failure: Most likely 2/2 pulmonary leukocyte infiltration. Pneumonia less likely given the lack of focal signs and recent history of appropriate antibiotic treatment, but fungal pneumonia still on the differential and she treated empirically treated with antifungals from 7/27-7/29. CT on 7/22 showed new b/l ground glass opacities and R-sided pleural effusion. VRP negative. BAL cx's no growth at 4 days. Thoracentesis consistent with exudative effusion. Chest tube with bloody drainage, pulm does not feel it is consistent with hemothorax. Has been on 1-2L since chest tube placement. Completed 5 day course of prednisone and levofloxacin for possible COPD exacerbation.  - Pleurex catheter tomorrow, target plt goal > 50  - Continue to wean O2 as tolerated  - Keep chest tube clamped  - PRN albuterol nebulizers  - Continue Symbicort inhaler BID    A-fib with RVR: Has a h/o A-fib and is on diltiazem at home. This morning she was tachycardic to the 130s with ECG significant for A-fib. This is most likely in the setting of her current hospital admission.   - Continue 25 metoprolol succinate BID.      AKI: Likely related to over-diuresis. With hyperkalemia, hyperphosphatemia, and elevated uric acid. ECG with no hyperkalemic changes. Cr improving over the past several days.  - 800 mg Sevelamer TID  - 300 mg allopurinol BID    Hyperglycemia: Has required insulin in the setting of steroids. Will begin to wean insulin after discontinuing prednisone on 8/1.  - Continue 10U NPH BID  - SSI    Advanced Care Planning: Upon admission she was DNR/DNI. Given her admission for acute hypoxic respiratory failure, we discussed her code status. She said she would be amenable to intubation and transfer to the MICU if there was a reversible cause of her respiratory failure. She was clear that she would not want chest compressions if she were to have cardiac arrest.     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 Malen Gauze as an outpatient as well as Dr. Donneta Romberg locally in [place]. Was on Enasidenib since 06/2017 and this was discontinued at admission. FLT3 negative.    - Will follow-up with Dr. Malen Gauze regarding outpatient chemotherapy. Most likely no curative options, but might help improve her respiratory status.  - 300mg  allopurinol BID.  - Daily BMP   - Daily CBC w/ diff  - Transfuse for Hgb <7 and platelets <10  -??Transfuse cryoprecipitate for fibrinogen <150 if bleeding or consumptive process??  - Continue acyclovir for prophylaxis  - Will discuss with Dr. Malen Gauze regarding future chemotherapy once myeloid mutation panel results.    ___________________________________________________________________    Subjective:  - Increased O2 to 2L today.    Labs/Studies:  Labs and Studies from the last 24hrs per EMR and Reviewed        Objective:  Temp:  [36.7 ??C-37.4 ??C] 36.7 ??C  Heart Rate:  [84-116] 104  Resp:  [20-22] 20  BP: (109-150)/(47-86) 133/86  SpO2:  [96 %-97 %] 96 %    Gen: Tired appearing, elderly lady resting comfortably in the chair; no acute distress  Pulm: Mild diffuse expiratory wheezes, decreased breath sounds on the R, no respiratory distress  Abd: Non-distended  Neuro: Awake with appropriate conversation, moves all extremities equally.  Ext: No  apparent edema.    Harrell Gave, MD  Neurology Resident, PGY-1  April 12, 2018 6:52 PM

## 2018-04-13 NOTE — Unmapped (Signed)
Platelets did not come up enough after 2 units were given.  Placement of PleurX postponed to 8/6. Pt is on 1.5 liters of oxygen. Sat in the chair for several hours today and Talbert Forest, pt's sister, assisted with bathing. Will complete 2 nd unit of pRBC's.CTM.

## 2018-04-13 NOTE — Unmapped (Deleted)
No acute events.   Exp wheezing at rest; incentive spirometer encouraged.   1 unit FFP infused, 1 unit pRBC infused, Vit K infused; all tolerated well.  IV abx infused as ordered.  38.1 temp during late afternoon; asymptomatic.   Mild headache endorsed with good relief from tylenol.  Voiding dark amber urine. Colostomy self managed. Appetite good.  Other vitals wnl.        Problem: Adult Inpatient Plan of Care  Goal: Plan of Care Review  04/12/2018 1907 by Baker Janus, RN  Outcome: Progressing  04/12/2018 1739 by Baker Janus, RN  Outcome: Ongoing - Unchanged  Goal: Patient-Specific Goal (Individualization)  04/12/2018 1907 by Baker Janus, RN  Outcome: Progressing  04/12/2018 1739 by Baker Janus, RN  Outcome: Ongoing - Unchanged  Goal: Absence of Hospital-Acquired Illness or Injury  04/12/2018 1907 by Baker Janus, RN  Outcome: Progressing  04/12/2018 1739 by Baker Janus, RN  Outcome: Ongoing - Unchanged  Goal: Optimal Comfort and Wellbeing  04/12/2018 1907 by Baker Janus, RN  Outcome: Progressing  04/12/2018 1739 by Baker Janus, RN  Outcome: Ongoing - Unchanged  Goal: Readiness for Transition of Care  04/12/2018 1907 by Baker Janus, RN  Outcome: Progressing  04/12/2018 1739 by Baker Janus, RN  Outcome: Ongoing - Unchanged  Goal: Rounds/Family Conference  04/12/2018 1907 by Baker Janus, RN  Outcome: Progressing  04/12/2018 1739 by Baker Janus, RN  Outcome: Ongoing - Unchanged

## 2018-04-13 NOTE — Unmapped (Signed)
Daily Progress Note    Assessment/Plan:    Jessica Wilson is a 77 y.o. female who presented to [facility] with Acute and chronic respiratory failure with hypoxia (CMS-HCC) concerning for differentiation syndrome vs leukocytic infiltration vs pneumonia.    Hypoxic respiratory failure: Most likely 2/2 pulmonary leukocyte infiltration. Pneumonia less likely given the lack of focal signs and recent history of appropriate antibiotic treatment, but fungal pneumonia still on the differential and she treated empirically treated with antifungals from 7/27-7/29. CT on 7/22 showed new b/l ground glass opacities and R-sided pleural effusion. VRP negative. BAL cx's no growth at 4 days. Thoracentesis consistent with exudative effusion. Chest tube with bloody drainage, pulm does not feel it is consistent with hemothorax. Has been on 1-2L since chest tube placement. Completed 5 day course of prednisone and levofloxacin for possible COPD exacerbation.  - Received 2U platelets this morning and unable to get platelets >50. Interventional pulmonology would prefer pleurodesis rathar than  Pleur-x catheter. [name] will think about it and decide tomorrow.  - Continue to wean O2 as tolerated  - Interventional pulmonology unclamped chest tube  - PRN albuterol nebulizers  - Continue Symbicort inhaler BID    A-fib with RVR: Has a h/o A-fib and is on diltiazem at home. This morning she was tachycardic to the 130s with ECG significant for A-fib. This is most likely in the setting of her current hospital admission. HR has been <110 consistently.  - Continue 25 metoprolol succinate BID.      AKI: Likely related to over-diuresis. With hyperkalemia, hyperphosphatemia, and elevated uric acid. ECG with no hyperkalemic changes. Cr improving over the past several days.  - 300 mg allopurinol daily    Hyperglycemia: Has required insulin in the setting of steroids. Will begin to wean insulin after discontinuing prednisone on 8/1.  - Continue 10U NPH BID  - SSI    Advanced Care Planning: Upon admission she was DNR/DNI. Given her admission for acute hypoxic respiratory failure, we discussed her code status. She said she would be amenable to intubation and transfer to the MICU if there was a reversible cause of her respiratory failure. She was clear that she would not want chest compressions if she were to have cardiac arrest.     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 Malen Gauze as an outpatient as well as Dr. Donneta Romberg locally in [place]. Was on Enasidenib since 06/2017 and this was discontinued at admission. FLT3 negative.    - Will follow-up with Dr. Malen Gauze regarding outpatient chemotherapy. Most likely no curative options, but might help improve her respiratory status.  - 300mg  allopurinol BID.  - Daily BMP   - Daily CBC w/ diff  - Transfuse for Hgb <7 and platelets <10  -??Transfuse cryoprecipitate for fibrinogen <150 if bleeding or consumptive process??  - Continue acyclovir for prophylaxis  - Will discuss with Dr. Malen Gauze regarding future chemotherapy once myeloid mutation panel results.    ___________________________________________________________________    Subjective:  - Continues to feel short of breath on 2L [place], but SpO2 in the mid-90s.    Labs/Studies:  Labs and Studies from the last 24hrs per EMR and Reviewed        Objective:  Temp:  [36.8 ??C-37 ??C] 36.9 ??C  Heart Rate:  [93-115] 94  Resp:  [20] 20  BP: (100-135)/(42-72) 127/68  SpO2:  [87 %-100 %] 98 %    Gen: Tired, uncomfortable appearing, elderly lady lying in the bed; no acute distress  Pulm: Mild diffuse expiratory wheezes, decreased breath sounds on the R, no respiratory distress  Abd: Non-distended  Neuro: Awake with appropriate conversation, moves all extremities equally.  Ext: No apparent edema.    Harrell Gave, MD  Neurology Resident, PGY-1  April 13, 2018 1:08 PM

## 2018-04-13 NOTE — Unmapped (Signed)
No acute events.  Patient oob to chair most of day.   Chest tube clamped.   Patient unable to complete 6 minute walk due to her complaining that she felt faint and HR>130.   Patient's O2 sats never fell below 90% while sitting in chair without oxygen.   No pain endorsed.  Appetite good.  Voiding appropriately.  VSS.       Problem: Adult Inpatient Plan of Care  Goal: Plan of Care Review  Outcome: Ongoing - Unchanged  Goal: Patient-Specific Goal (Individualization)  Outcome: Ongoing - Unchanged  Goal: Absence of Hospital-Acquired Illness or Injury  Outcome: Ongoing - Unchanged  Goal: Optimal Comfort and Wellbeing  Outcome: Ongoing - Unchanged  Goal: Readiness for Transition of Care  Outcome: Ongoing - Unchanged  Goal: Rounds/Family Conference  Outcome: Ongoing - Unchanged

## 2018-04-13 NOTE — Unmapped (Addendum)
Pt A/O. VSS. Asymptomatic tachycardia noted, continues scheduled metoprolol for Afib. Afebrile during shift. Maintaining oxygen saturation on 2L O2 via [place], prn nebs given SOB & wheezing. . No c/o pain. No falls/injuries during shift. No s/s of DVT. Chest tube remains intact & clamped. Receiving blood product replacements for hemoglobin of 6.8 & platelets count of 19 for procedure. NPO since midnight for pleurX. Will continue to monitor.  Problem: Adult Inpatient Plan of Care  Goal: Plan of Care Review  Outcome: Progressing  Goal: Patient-Specific Goal (Individualization)  Outcome: Progressing  Goal: Absence of Hospital-Acquired Illness or Injury  Outcome: Progressing  Goal: Optimal Comfort and Wellbeing  Outcome: Progressing  Goal: Readiness for Transition of Care  Outcome: Progressing  Goal: Rounds/Family Conference  Outcome: Progressing     Problem: Self-Care Deficit  Goal: Improved Ability to Complete Activities of Daily Living  Outcome: Progressing     Problem: Fall Injury Risk  Goal: Absence of Fall and Fall-Related Injury  Outcome: Progressing     Problem: COPD Comorbidity  Goal: Maintenance of COPD Symptom Control  Outcome: Progressing     Problem: Hypertension Comorbidity  Goal: Blood Pressure in Desired Range  Outcome: Progressing     Problem: Obstructive Sleep Apnea Risk or Actual (Comorbidity Management)  Goal: Unobstructed Breathing During Sleep  Outcome: Progressing     Problem: Pain Chronic (Persistent) (Comorbidity Management)  Goal: Acceptable Pain Control and Functional Ability  Outcome: Progressing     Problem: Skin Injury Risk Increased  Goal: Skin Health and Integrity  Outcome: Progressing

## 2018-04-13 NOTE — Unmapped (Signed)
[facility] Medication Referral: PA APPROVED    Medication (Brand/Generic): VENCLEXTA    Initial FSI Test Claim completed with resulted information below:  No PA required  Patient ABLE to fill at [facility]  Insurance Company:  [id number]  Anticipated Copay: $0  Is anticipated copay with a copay card or grant? NO    As Co-pay is under $100 defined limit, per policy there will be no further investigation of need for financial assistance at this time unless patient requests. This referral has been communicated to the provider and handed off to the [facility] team for further processing and filling of prescribed medication.   ______________________________________________________________________  Please utilize this referral for viewing purposes as it will serve as the central location for all relevant documentation and updates.

## 2018-04-13 NOTE — Telephone Encounter (Signed)
Received incoming call from [facility]. Patient being d/c from [facility] today. Needs apt Wednesday for lab draw and to see Dr. B. I spoke with Dr. Jacinto Reap. Md would like to order cbc, metc, hold tube and ldh.   Colette, please also hold chair for possible 1 unit of blood/possible plts on Wednesday as well. Thanks.

## 2018-04-14 LAB — MANUAL DIFFERENTIAL
BASOPHILS - REL (DIFF): 0 %
EOSINOPHILS - ABS (DIFF): 0 10*9/L (ref 0.0–0.4)
EOSINOPHILS - REL (DIFF): 0 %
LYMPHOCYTES - REL (DIFF): 43 %
MONOCYTES - ABS (DIFF): 0.7 10*9/L (ref 0.2–0.8)
MONOCYTES - REL (DIFF): 45 %
NEUTROPHILS - ABS (DIFF): 0.2 10*9/L — CL (ref 2.0–7.5)
NEUTROPHILS - REL (DIFF): 12 %

## 2018-04-14 LAB — SODIUM: Sodium:SCnc:Pt:Ser/Plas:Qn:: 142

## 2018-04-14 LAB — CBC W/ AUTO DIFF
HEMATOCRIT: 26.2 % — ABNORMAL LOW (ref 36.0–46.0)
HEMOGLOBIN: 8.5 g/dL — ABNORMAL LOW (ref 12.0–16.0)
MEAN CORPUSCULAR HEMOGLOBIN CONC: 32.3 g/dL (ref 31.0–37.0)
MEAN CORPUSCULAR HEMOGLOBIN: 30.5 pg (ref 26.0–34.0)
MEAN PLATELET VOLUME: 8.6 fL (ref 7.0–10.0)
PLATELET COUNT: 30 10*9/L — ABNORMAL LOW (ref 150–440)
RED BLOOD CELL COUNT: 2.78 10*12/L — ABNORMAL LOW (ref 4.00–5.20)
RED CELL DISTRIBUTION WIDTH: 16.6 % — ABNORMAL HIGH (ref 12.0–15.0)
WBC ADJUSTED: 1.6 10*9/L — ABNORMAL LOW (ref 4.5–11.0)

## 2018-04-14 LAB — BASIC METABOLIC PANEL
ANION GAP: 8 mmol/L — ABNORMAL LOW (ref 9–15)
BLOOD UREA NITROGEN: 29 mg/dL — ABNORMAL HIGH (ref 7–21)
BUN / CREAT RATIO: 27
CALCIUM: 8.5 mg/dL (ref 8.5–10.2)
CHLORIDE: 108 mmol/L — ABNORMAL HIGH (ref 98–107)
CO2: 26 mmol/L (ref 22.0–30.0)
CREATININE: 1.07 mg/dL — ABNORMAL HIGH (ref 0.60–1.00)
EGFR CKD-EPI AA FEMALE: 58 mL/min/{1.73_m2} — ABNORMAL LOW (ref >=60)
EGFR CKD-EPI NON-AA FEMALE: 51 mL/min/{1.73_m2} — ABNORMAL LOW (ref >=60)
GLUCOSE RANDOM: 145 mg/dL (ref 65–179)
POTASSIUM: 4.5 mmol/L (ref 3.5–5.0)

## 2018-04-14 LAB — URIC ACID: Urate:MCnc:Pt:Ser/Plas:Qn:: 2.8 — ABNORMAL LOW

## 2018-04-14 LAB — PHOSPHORUS: Phosphate:MCnc:Pt:Ser/Plas:Qn:: 3.1

## 2018-04-14 LAB — MAGNESIUM: Magnesium:MCnc:Pt:Ser/Plas:Qn:: 1.9

## 2018-04-14 LAB — MONOCYTES - ABS (DIFF): Lab: 0.7

## 2018-04-14 LAB — MEAN CORPUSCULAR VOLUME: Lab: 94.4

## 2018-04-14 NOTE — Unmapped (Signed)
Daily Progress Note  Transfer Summary:  Jessica Wilson is a 77 y.o. female who presented to [facility] with Acute and chronic respiratory failure with hypoxia (CMS-HCC) concerning for differentiation syndrome vs leukocytic infiltration vs pneumonia.CT on 7/22 showed new b/l ground glass opacities and R-sided pleural effusion. VRP negative. BAL cx's no growth at 4 days. Thoracentesis consistent with exudative effusion. Chest tube with bloody drainage, pulm does not feel it is consistent with hemothorax. Has been on 1-2L since chest tube placement. Completed 5 day course of prednisone and levofloxacin for possible COPD exacerbation. Pulm was consulted for evaluation of indwelling pleural catheter. Due to low platelets it was decided to instead pursue chemical pleurodesis. Pt was counseled on likelihood of pain post procedure and decision was made to transfer pt to stepdown unit prior to and post procedure for PCA pain management.     Assessment/Plan:    Jessica Wilson is a 77 y.o. female who presented to [facility] with Acute and chronic respiratory failure with hypoxia (CMS-HCC) concerning for differentiation syndrome vs leukocytic infiltration vs pneumonia.    Hypoxic respiratory failure: Most likely 2/2 pulmonary leukocyte infiltration. Pneumonia less likely given the lack of focal signs and recent history of appropriate antibiotic treatment, but fungal pneumonia still on the differential and she treated empirically treated with antifungals from 7/27-7/29. CT on 7/22 showed new b/l ground glass opacities and R-sided pleural effusion. VRP negative. BAL cx's no growth at 4 days. Thoracentesis consistent with exudative effusion. Chest tube with bloody drainage, pulm does not feel it is consistent with hemothorax. Has been on 1-2L since chest tube placement. Completed 5 day course of prednisone and levofloxacin for possible COPD exacerbation.  -Pt and pulmonology in agreement to pursue chemical pleurodesis 8/7, will transfer to stepdown unit for pre and post procedural monitoring  - Continue to wean O2 as tolerated  - Interventional pulmonology unclamped chest tube  - PRN albuterol nebulizers  - Continue Symbicort inhaler BID    A-fib with RVR: Has a h/o A-fib and is on diltiazem at home. This morning she was tachycardic to the 130s with ECG significant for A-fib. This is most likely in the setting of her current hospital admission. HR has been <110 consistently. Metoprolol 25mg  BID held due to hypotension 8/5. Pt tachycardic to 135 night of 8/5 and given 5mg  bolus and started on metoprolol tartrate 12.5mg  q6 with rate improving to 100s-110.   -Continue Metoprolol 12.5 q6.     AKI: Likely related to over-diuresis. With hyperkalemia, hyperphosphatemia, and elevated uric acid. ECG with no hyperkalemic changes. Cr improving over the past several days.  - 300 mg allopurinol daily    Hyperglycemia: Has required insulin in the setting of steroids. Will begin to wean insulin after discontinuing prednisone on 8/1.  - Continue 10U NPH BID  - SSI    Advanced Care Planning: Upon admission she was DNR/DNI. Given her admission for acute hypoxic respiratory failure, we discussed her code status. She said she would be amenable to intubation and transfer to the MICU if there was a reversible cause of her respiratory failure. She was clear that she would not want chest compressions if she were to have cardiac arrest.     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 Malen Gauze as an outpatient as well as Dr. Donneta Romberg locally in [place]. Was on Enasidenib since 06/2017 and this was discontinued at admission. FLT3 negative.    - Will follow-up with Dr. Malen Gauze regarding outpatient chemotherapy. Most likely  no curative options, but might help improve her respiratory status.  - 300mg  allopurinol BID.  - Daily BMP   - Daily CBC w/ diff  - Transfuse for Hgb <7 and platelets <10  -??Transfuse cryoprecipitate for fibrinogen <150 if bleeding or consumptive process??  - Continue acyclovir for prophylaxis  - Will discuss with Dr. Malen Gauze regarding future chemotherapy once myeloid mutation panel results.    ___________________________________________________________________    Subjective:  - Pt tachycardic in a fib with RVR to 135, received 5 mg metoprolol bolus, and started metoprolol tartrate 12.5mg   q 6 with improvement in HR to 106. Pt feels breathing is improved this morning, still on O2 [place]. She spoke with pulmonary this morning and wishes to proceed with a pleurodesis.     Labs/Studies:  Labs and Studies from the last 24hrs per EMR and Reviewed        Objective:  Temp:  [36.3 ??C-37.2 ??C] 36.8 ??C  Heart Rate:  [94-135] 106  Resp:  [20-30] 22  BP: (86-127)/(51-91) 112/57  SpO2:  [95 %-99 %] 99 %    Gen: Laying in bed, comfortable, pleasant  Pulm: Mild diffuse expiratory wheezes, decreased breath sounds on the R. Crackles throughout R lung fields. no respiratory distress  CV: Tachycardic, irregularly irregular rhythm. No m/r/g. Radial pulses equal 2+  Abd: Non-distended  Neuro: Awake with appropriate conversation, moves all extremities equally.  Ext: No apparent edema.

## 2018-04-14 NOTE — Unmapped (Signed)
INTERVENTIONAL PULMONOLOGY FOLLOW-UP CONSULT NOTE    Assessment:     [name] is a 77 year old female with myelodysplastic syndrome which progressed to AML who was admitted due to hypoxemic respiratory failure. She had increasing R pleural effusion with significant dyspnea so a pigtail chest tube was placed on 04/05/18 with improvement in dyspnea. IP was consulted for evaluation of indwelling pleural catheter placement.    Due to difficulty transfusing platelets above 50 and likelihood that, if platelets are able to reach 50 but would soon decrease, we will defer PleurX catheter placement due to increased risk of bleeding in the setting of thrombocytopenia.    We discussed chemical pleurodesis with talc or doxycycline which would obliterate the pleural space and prevent recurrence of her malignant pleural effusion. The concern with pleurodesis is the potential for pain as this induces significant inflammation within the pleural space. We would plan for PCA pump prior to procedure and transfer to [facility] for closer monitoring. [name] wishes to think about this as an option and we will re-address tomorrow.    Plan:     1. Thrombocytopenia prevents safe placement of PleurX catheter at this time.    2. We discussed chemical pleurodesis with doxycycline or talc and the patient wishes to think about this overnight.     3. Please maintain chest tube to water seal and flush twice daily with 20 cc sterile saline.    This patient was seen and evaluated with Dr. Salli Real.    Please call the Interventional Pulmonary fellow at [phone] with any questions.    Daphane Odekirk R. Susumu Hackler, DO  Interventional Pulmonary Fellow  Division of Pulmonary Diseases and Critical Care Medicine  Pager: [phone]      Subjective:     S: [name] reports her shortness of breath has increased, especially over the last couple of days.     Objective:     Physical Examination:     BP 114/55  - Pulse 123  - Temp 36.7 ??C (98.1 ??F) (Oral)  - Resp 30  - Ht 160 cm (5' 2.99)  - Wt 99 kg (218 lb 4.1 oz)  - SpO2 98%  - BMI 38.67 kg/m??    General appearance - oriented to person, place, and time  Heart - normal rate, regular rhythm, normal S1, S2, no murmurs, rubs, clicks or gallops  Chest - clear to auscultation, no wheezes, rales or rhonchi, symmetric air entry  Abdomen - soft, nontender  Extremities - No pedal edema, no clubbing or cyanosis    Labs and Imaging:    Chest X-ray 04/13/18: Stable support devices.    Bibasilar opacities with small right and trace left pleural effusions.    No pneumothorax.    Stable cardiomediastinal silhouette.

## 2018-04-14 NOTE — Unmapped (Signed)
Pt A/O. VSS. Remote tele ordered during shift, per tele pt is Afib w/ RVR, call x1 during shift. Afebrile during shift. Maintaining oxygen saturation on 1.5L O2 via , continuing to encourage pt to pulmonary toilet, prn nebs given as needed for wheezing & SOB. No c/o pain. No s/s of DVT. No falls/injuries during shift. No skin injuries noted. Chest tube remains intact & to water seal, see I&O flowsheet for output. NPO since midnight for procedure. Will continue to monitor.  Problem: Adult Inpatient Plan of Care  Goal: Plan of Care Review  Outcome: Progressing  Goal: Patient-Specific Goal (Individualization)  Outcome: Progressing  Goal: Absence of Hospital-Acquired Illness or Injury  Outcome: Progressing  Goal: Optimal Comfort and Wellbeing  Outcome: Progressing  Goal: Readiness for Transition of Care  Outcome: Progressing  Goal: Rounds/Family Conference  Outcome: Progressing     Problem: Self-Care Deficit  Goal: Improved Ability to Complete Activities of Daily Living  Outcome: Progressing     Problem: Fall Injury Risk  Goal: Absence of Fall and Fall-Related Injury  Outcome: Progressing     Problem: COPD Comorbidity  Goal: Maintenance of COPD Symptom Control  Outcome: Progressing     Problem: Hypertension Comorbidity  Goal: Blood Pressure in Desired Range  Outcome: Progressing     Problem: Obstructive Sleep Apnea Risk or Actual (Comorbidity Management)  Goal: Unobstructed Breathing During Sleep  Outcome: Progressing     Problem: Pain Chronic (Persistent) (Comorbidity Management)  Goal: Acceptable Pain Control and Functional Ability  Outcome: Progressing     Problem: Skin Injury Risk Increased  Goal: Skin Health and Integrity  Outcome: Progressing

## 2018-04-15 ENCOUNTER — Telehealth: Payer: Self-pay | Admitting: *Deleted

## 2018-04-15 ENCOUNTER — Inpatient Hospital Stay: Payer: Medicare Other | Admitting: Internal Medicine

## 2018-04-15 ENCOUNTER — Inpatient Hospital Stay: Payer: Medicare Other

## 2018-04-15 LAB — MANUAL DIFFERENTIAL
BASOPHILS - ABS (DIFF): 0 10*9/L (ref 0.0–0.1)
BASOPHILS - REL (DIFF): 0 %
EOSINOPHILS - REL (DIFF): 0 %
LYMPHOCYTES - ABS (DIFF): 0.6 10*9/L — ABNORMAL LOW (ref 1.5–5.0)
LYMPHOCYTES - REL (DIFF): 26 %
MONOCYTES - ABS (DIFF): 1.6 10*9/L — ABNORMAL HIGH (ref 0.2–0.8)
MONOCYTES - REL (DIFF): 67 %
NEUTROPHILS - ABS (DIFF): 0.2 10*9/L — CL (ref 2.0–7.5)
NEUTROPHILS - REL (DIFF): 7 %

## 2018-04-15 LAB — MAGNESIUM: Magnesium:MCnc:Pt:Ser/Plas:Qn:: 1.8

## 2018-04-15 LAB — CBC W/ AUTO DIFF
MEAN CORPUSCULAR HEMOGLOBIN CONC: 32.8 g/dL (ref 31.0–37.0)
MEAN CORPUSCULAR HEMOGLOBIN: 31.3 pg (ref 26.0–34.0)
MEAN CORPUSCULAR VOLUME: 95.3 fL (ref 80.0–100.0)
MEAN PLATELET VOLUME: 8.9 fL (ref 7.0–10.0)
PLATELET COUNT: 21 10*9/L — ABNORMAL LOW (ref 150–440)
RED BLOOD CELL COUNT: 2.71 10*12/L — ABNORMAL LOW (ref 4.00–5.20)
RED CELL DISTRIBUTION WIDTH: 16.7 % — ABNORMAL HIGH (ref 12.0–15.0)
WBC ADJUSTED: 2.4 10*9/L — ABNORMAL LOW (ref 4.5–11.0)

## 2018-04-15 LAB — BASIC METABOLIC PANEL
BLOOD UREA NITROGEN: 21 mg/dL (ref 7–21)
BUN / CREAT RATIO: 25
CALCIUM: 8 mg/dL — ABNORMAL LOW (ref 8.5–10.2)
CHLORIDE: 112 mmol/L — ABNORMAL HIGH (ref 98–107)
CO2: 26 mmol/L (ref 22.0–30.0)
CREATININE: 0.84 mg/dL (ref 0.60–1.00)
EGFR CKD-EPI AA FEMALE: 78 mL/min/{1.73_m2} (ref >=60)
EGFR CKD-EPI NON-AA FEMALE: 68 mL/min/{1.73_m2} (ref >=60)
GLUCOSE RANDOM: 130 mg/dL (ref 65–179)
POTASSIUM: 3.8 mmol/L (ref 3.5–5.0)
SODIUM: 142 mmol/L (ref 135–145)

## 2018-04-15 LAB — TOXIC VACUOLATION

## 2018-04-15 LAB — BUN / CREAT RATIO: Urea nitrogen/Creatinine:MRto:Pt:Ser/Plas:Qn:: 25

## 2018-04-15 LAB — URIC ACID
Urate:MCnc:Pt:Ser/Plas:Qn:: 0
Urate:MCnc:Pt:Ser/Plas:Qn:: 2.7 — ABNORMAL LOW

## 2018-04-15 LAB — PHOSPHORUS
Phosphate:MCnc:Pt:Ser/Plas:Qn:: 0
Phosphate:MCnc:Pt:Ser/Plas:Qn:: 2.5 — ABNORMAL LOW

## 2018-04-15 LAB — HEMATOCRIT: Lab: 25.8 — ABNORMAL LOW

## 2018-04-15 MED ORDER — INSULIN LISPRO 100 UNIT/ML ~~LOC~~ SOLN
.00 | SUBCUTANEOUS | Status: DC
Start: 2018-04-16 — End: 2018-04-15

## 2018-04-15 MED ORDER — METOPROLOL TARTRATE 25 MG PO TABS
12.50 | ORAL_TABLET | ORAL | Status: DC
Start: 2018-04-15 — End: 2018-04-15

## 2018-04-15 MED ORDER — NALOXONE HCL 0.4 MG/ML IJ SOLN
0.20 | INTRAMUSCULAR | Status: DC
Start: ? — End: 2018-04-15

## 2018-04-15 MED ORDER — MORPHINE SULFATE 2 MG/ML IJ SOLN
2.00 | INTRAMUSCULAR | Status: DC
Start: ? — End: 2018-04-15

## 2018-04-15 MED ORDER — GENERIC EXTERNAL MEDICATION
Status: DC
Start: ? — End: 2018-04-15

## 2018-04-15 MED ORDER — GENERIC EXTERNAL MEDICATION
1.00 | Status: DC
Start: 2018-04-15 — End: 2018-04-15

## 2018-04-15 MED ORDER — MAGNESIUM OXIDE 400 MG PO TABS
400.00 | ORAL_TABLET | ORAL | Status: DC
Start: 2018-04-15 — End: 2018-04-15

## 2018-04-15 NOTE — Unmapped (Signed)
INTERVENTIONAL PULMONOLOGY FOLLOW-UP CONSULT NOTE    Assessment:     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 She had increasing R malignant/paramalignant pleural effusion with significant dyspnea. A pigtail chest tube was placed on 04/05/18 with improvement in dyspnea. IP was consulted for evaluation of indwelling pleural catheter placement. Due to her thrombocytopenia that has been refractory to multiple platelet transfusions which increases the risk of PleurX placement, we offered the option of chemical pleurodesis. We discussed risks and benefits, including that chemical pleurodesis has the potential to be painful in setting of induced pleural inflammation, and as per our protocol, we would transfer her to stepdown and make a PCA pump available after the procedure should she choose this option.    After further discussion with her family, [name] has decided to proceed with chemical pleurodesis. We are able to offer this to her on 8/7.    Plan:     1. Plan for pleurodesis with doxycycline on 04/15/18.    2. Please transfer to [facility] today in anticipation of pleurodesis tomorrow.    3. Please make patient NPO at midnight.    4. Please order a PCA pump with instructions not to start until the time of the procedure.     This patient was discussed with Dr. Salli Real who agreed with the above assessment and plan.    Please call the Interventional Pulmonary fellow at [phone] with any questions.      Subjective:     Dyspnea improved after unclamping chest tube yesterday. No fevers, chills.  After further consideration and discussion with her family, [name] has decided to proceed with chemical pleurodesis.    Objective:     Physical Examination:     BP 100/46  - Pulse 95  - Temp 36.6 ??C (Oral)  - Resp 21  - Ht 160 cm (5' 2.99)  - Wt 99 kg (218 lb 4.1 oz)  - SpO2 98%  - BMI 38.67 kg/m??    General appearance - sitting in chair. oriented to person, place, and time  Heart - normal rate, regular rhythm, normal S1, S2, no murmurs, rubs, clicks or gallops  Chest - clear to auscultation, no wheezes, rales or rhonchi, symmetric air entry  Abdomen - soft, nontender  Extremities - No pedal edema, no clubbing or cyanosis    Labs and Imaging:    Chest X-ray 04/13/18: Stable support devices.    Bibasilar opacities with small right and trace left pleural effusions.    No pneumothorax.    Stable cardiomediastinal silhouette.

## 2018-04-15 NOTE — Unmapped (Signed)
Patient remains stable this shift and oob to the chair for several hours this morning with two voids on the bedside commode and one BM.  Patient remains on 02 to keep sp02 stable.  Patient awaiting her pleurodesis procedure that she is ordered to have.  Patient's sister at the bedside; plan of care update completed with this RN, patient, and her sister.  Will monitor.    Problem: Adult Inpatient Plan of Care  Goal: Plan of Care Review  Outcome: Progressing  Goal: Patient-Specific Goal (Individualization)  Outcome: Progressing  Goal: Absence of Hospital-Acquired Illness or Injury  Outcome: Progressing  Goal: Optimal Comfort and Wellbeing  Outcome: Progressing  Goal: Readiness for Transition of Care  Outcome: Progressing  Goal: Rounds/Family Conference  Outcome: Progressing     Problem: Self-Care Deficit  Goal: Improved Ability to Complete Activities of Daily Living  Outcome: Progressing     Problem: Fall Injury Risk  Goal: Absence of Fall and Fall-Related Injury  Outcome: Progressing     Problem: COPD Comorbidity  Goal: Maintenance of COPD Symptom Control  Outcome: Progressing     Problem: Hypertension Comorbidity  Goal: Blood Pressure in Desired Range  Outcome: Progressing     Problem: Obstructive Sleep Apnea Risk or Actual (Comorbidity Management)  Goal: Unobstructed Breathing During Sleep  Outcome: Progressing     Problem: Pain Chronic (Persistent) (Comorbidity Management)  Goal: Acceptable Pain Control and Functional Ability  Outcome: Progressing     Problem: Skin Injury Risk Increased  Goal: Skin Health and Integrity  Outcome: Progressing

## 2018-04-15 NOTE — Unmapped (Signed)
MICU Daily Progress Note     Date of Service: 04/14/2018    Problem List:   Principal Problem:    Acute and chronic respiratory failure with hypoxia (CMS-HCC)  Active Problems:    Acute myeloid leukemia not having achieved remission (CMS-HCC)  Resolved Problems:    * No resolved hospital problems. *      Transfer Summary:  Jessica Wilson is a 77 y.o. female who presented to [facility] with Acute and chronic respiratory failure with hypoxia (CMS-HCC) concerning for differentiation syndrome vs leukocytic infiltration vs pneumonia.CT on 7/22 showed new b/l ground glass opacities and R-sided pleural effusion. VRP negative. BAL cx's no growth at 4 days. Thoracentesis consistent with exudative effusion. Chest tube with bloody drainage, pulm does not feel it is consistent with hemothorax. Has been on 1-2L since chest tube placement. Completed 5 day course of prednisone and levofloxacin for possible COPD exacerbation. Pulm was consulted for evaluation of indwelling pleural catheter. Due to low platelets it was decided to instead pursue chemical pleurodesis. Pt was counseled on likelihood of pain post procedure and decision was made to transfer pt to stepdown unit prior to and post procedure for PCA pain management.       Neurological   Pain control: Patient was transferred to ICU level of care for pain control. Will prepare PCA for likely need after chemical pleurodesis.   - Prepare PCA       Pulmonary   Hypoxic respiratory failure:??Unclear if PNA vs differentiation syndrome vs other. Pneumonia less likely given the lack of focal signs and recent history of appropriate antibiotic treatment, but fungal pneumonia still on the differential and she treated empirically treated with antifungals from 7/27-7/29. CT on 7/22 showed new b/l ground glass opacities and R-sided pleural effusion. VRP negative. BAL cx's no growth at 4 days. Thoracentesis consistent with exudative effusion. Chest tube with bloody drainage, pulm does not feel it is consistent with hemothorax. Has been on 1-2L since chest tube placement. Completed 5 day course of prednisone and levofloxacin for possible COPD exacerbation. Pt and pulmonology in agreement to pursue chemical pleurodesis 8/7, will transfer to stepdown unit for pre and post procedural monitoring.  - Continue to wean O2 as tolerated  - Chest tube to water seal overnight and flush twice daily with 20cc sterile saline  - PRN albuterol nebulizers  - Continue Symbicort inhaler BID  - PleurX in the AM    Cardiovascular   A-fib with RVR: Has a h/o A-fib and is on diltiazem at home. Has been tachycardic to the 130 this admission. She was switched to metoprolol this admission due to concern to for interaction between diltiazem and future chemotherapy.  - Continue Metoprolol 12.5mg  q6hr    Renal   AKI: Likely related to over-diuresis. With hyperkalemia, hyperphosphatemia, and elevated uric acid. ECG with no hyperkalemic changes. Cr improving over the past several days.  - 300 mg allopurinol daily    Infectious Disease/Autoimmune   NAI    FEN/GI   NPO for pleurex tomorrow     Malnutrition Assessment: Not done yet.     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 Malen Wilson as an outpatient as well as Dr. Donneta Wilson locally in [place]. Was on Enasidenib since 06/2017 and this was discontinued at admission. FLT3 negative. Will follow-up with Dr. Malen Wilson regarding outpatient chemotherapy. Most likely no curative options, but might help improve her respiratory status.  - 300mg  allopurinol BID  - Daily??BMP??  - Daily??CBC w/  diff  - Transfuse for Hgb <7 and platelets <10  -??Transfuse cryoprecipitate for fibrinogen <150 if bleeding or consumptive process??  - Continue acyclovir for prophylaxis    Endocrine   Hyperglycemia: Has required insulin in the setting of steroids. Will begin to wean insulin after discontinuing prednisone on 8/1.  - Continue 10U NPH BID  - SSI    Prophylaxis/LDA/Restraints/Consults   Can CVC be removed? No, port a cath for chemo  Can A-line be removed? N/A, no A-line present  Can Foley be removed? N/A, no Foley present  Mobility plan: Step 3 - Bed in chair position     Feeding: NPO for procedure  Analgesia: Pain adequately controlled  Sedation SAT/SBT: N/A  Thrombembolic ppx: Mechanical only, chemical contraindicated secondary to platelets <50  Head of bed: >30 deg  Ulcer ppx: Not indicated  Glucose within target range: Yes, in range    Patient Lines/Drains/Airways Status    Active Active Lines, Drains, & Airways     Name:   Placement date:   Placement time:   Site:   Days:    Port A Cath 02/05/18 Left Subclavian   02/05/18    0732    Subclavian   68    Chest Drainage System 1 Right Pleural 14 Fr.   04/05/18    1430    Pleural   9              Patient Lines/Drains/Airways Status    Active Wounds     None                Goals of Care     Code Status: Code with Limitations DNR, NOT DNI. She is amenable to intubation and transfer to the MICU. Does not want chest compressions.     Designated Healthcare Decision Maker:  [name] currently has decisional capacity for healthcare decision-making and is able to designate a surrogate healthcare decision maker. [name] designated healthcare decision maker(s) is/are her husband Jessica Wilson (the patient's spouse) as denoted by stated patient preference.      Subjective     Endorses dry cough and shortness of breath. No chest pain, no palpitations. No vomiting or diarrhea, no fevers. Denies any pain. Reports previous adverse effect with morphine, increased shortness of breath per her history.     Objective     Vitals - past 24 hours  Temp:  [36.3 ??C (97.3 ??F)-37.2 ??C (98.9 ??F)] 36.6 ??C (97.8 ??F)  Heart Rate:  [91-135] 104  Resp:  [22-30] 24  BP: (85-115)/(48-91) 97/52  SpO2:  [95 %-100 %] 99 % Intake/Output  I/O last 3 completed shifts:  In: 1385 [P.O.:360; Blood:1025]  Out: 590 [Urine:500; Chest Tube:90]     Physical Exam:    General: Chronically ill appearing, oriented to person, place, situation  HEENT: ptosis of right eye  CV: Normal rate, regular rhythm. No murmurs appreciated  Pulm: Expiratory wheezes on the right side, unable to examine left side due to habitus and mobility   GI: Soft, non tender, non distended  MSK: No pedal edema      Continuous Infusions:   ??? sodium chloride 20 mL/hr (03/30/18 1600)   ??? sodium chloride         Scheduled Medications:   ??? acyclovir  400 mg Oral BID   ??? allopurinol  300 mg Oral Daily   ??? budesonide-formoterol  2 puff Inhalation BID (RT)   ??? buPROPion  150 mg Oral BID   ???  insulin lispro  0-12 Units Subcutaneous ACHS   ??? levoFLOXacin  500 mg Oral Q24H SCH   ??? magic mouthwash oral  10 mL Oral 4x Daily   ??? [MAR Hold] magnesium oxide  400 mg Oral BID   ??? melatonin  3 mg Oral QPM   ??? metoprolol tartrate  12.5 mg Oral Q6H SCH   ??? sertraline  100 mg Oral Daily   ??? sodium chloride  10 mL Intravenous BID       PRN medications:  acetaminophen, ipratropium **AND** albuterol, cetirizine, loperamide, loperamide, phenol, prochlorperazine, sodium chloride    Data/Imaging Review: Reviewed in Epic and personally interpreted on 04/14/2018. See EMR for detailed results.

## 2018-04-15 NOTE — Unmapped (Signed)
Pt transferred from 4onc for planned pleurodesis 8/7. Pt A&O x3 (d/o exact date). Pt sister at bedside. Pt in AFib rate 90-120's Bp soft - pt finishing 250 mL bolus on arrival to MPCU. Pt Afebrile. Pt BG monitored AC/HS and stable. Pt has R chest tube to water seal - small dark red sanguinious output. Pt OOB to [facility] per report - BSC ordered for new room. Pt has regular diet and tolerating well. Pt NPO after midnight tomorrow.     Pt safety maintained. Will continue to monitor and reassess   Problem: Adult Inpatient Plan of Care  Goal: Plan of Care Review  Outcome: Ongoing - Unchanged  Goal: Patient-Specific Goal (Individualization)  Outcome: Ongoing - Unchanged  Goal: Absence of Hospital-Acquired Illness or Injury  Outcome: Ongoing - Unchanged  Goal: Optimal Comfort and Wellbeing  Outcome: Ongoing - Unchanged  Goal: Readiness for Transition of Care  Outcome: Ongoing - Unchanged  Goal: Rounds/Family Conference  Outcome: Ongoing - Unchanged     Problem: Self-Care Deficit  Goal: Improved Ability to Complete Activities of Daily Living  Outcome: Ongoing - Unchanged     Problem: Fall Injury Risk  Goal: Absence of Fall and Fall-Related Injury  Outcome: Ongoing - Unchanged     Problem: COPD Comorbidity  Goal: Maintenance of COPD Symptom Control  Outcome: Ongoing - Unchanged     Problem: Hypertension Comorbidity  Goal: Blood Pressure in Desired Range  Outcome: Ongoing - Unchanged     Problem: Obstructive Sleep Apnea Risk or Actual (Comorbidity Management)  Goal: Unobstructed Breathing During Sleep  Outcome: Ongoing - Unchanged     Problem: Pain Chronic (Persistent) (Comorbidity Management)  Goal: Acceptable Pain Control and Functional Ability  Outcome: Ongoing - Unchanged     Problem: Skin Injury Risk Increased  Goal: Skin Health and Integrity  Outcome: Ongoing - Unchanged

## 2018-04-15 NOTE — Unmapped (Signed)
A&Ox3.  Afebrile, on 2L [place].  Afib with rates ranging from 100-130s.  No complaints of pain.  Adequate urine output.  No BM this shift.  Patient able to get to bedside commode with contact guard assist.  Chest tube to water seal with minimal sanguinous output.  NPO since midnight for pleurodesis.  Call bell within reach.  Will continue to monitor.     Problem: Adult Inpatient Plan of Care  Goal: Plan of Care Review  Outcome: Ongoing - Unchanged  Goal: Patient-Specific Goal (Individualization)  Outcome: Ongoing - Unchanged  Goal: Absence of Hospital-Acquired Illness or Injury  Outcome: Ongoing - Unchanged  Goal: Optimal Comfort and Wellbeing  Outcome: Ongoing - Unchanged  Goal: Readiness for Transition of Care  Outcome: Ongoing - Unchanged  Goal: Rounds/Family Conference  Outcome: Ongoing - Unchanged     Problem: Self-Care Deficit  Goal: Improved Ability to Complete Activities of Daily Living  Outcome: Ongoing - Unchanged     Problem: Fall Injury Risk  Goal: Absence of Fall and Fall-Related Injury  Outcome: Ongoing - Unchanged     Problem: COPD Comorbidity  Goal: Maintenance of COPD Symptom Control  Outcome: Ongoing - Unchanged     Problem: Hypertension Comorbidity  Goal: Blood Pressure in Desired Range  Outcome: Ongoing - Unchanged     Problem: Obstructive Sleep Apnea Risk or Actual (Comorbidity Management)  Goal: Unobstructed Breathing During Sleep  Outcome: Ongoing - Unchanged     Problem: Pain Chronic (Persistent) (Comorbidity Management)  Goal: Acceptable Pain Control and Functional Ability  Outcome: Ongoing - Unchanged     Problem: Skin Injury Risk Increased  Goal: Skin Health and Integrity  Outcome: Ongoing - Unchanged

## 2018-04-15 NOTE — Unmapped (Signed)
MICU Daily Progress Note     Date of Service: 04/15/2018    Problem List:   Principal Problem:    Acute and chronic respiratory failure with hypoxia (CMS-HCC)  Active Problems:    Acute myeloid leukemia not having achieved remission (CMS-HCC)  Resolved Problems:    * No resolved hospital problems. *      Interval history: Jessica Wilson is a 77 y.o. 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 She had increasing??R??malignant/paramalignant pleural effusion with significant dyspnea. A pigtail chest tube was placed on 04/05/18 with improvement in dyspnea. IP was consulted for PleurX catheter placement, but because her platelets could not be transfused to >50, opted for pleurodesis with doxycycline instead.     24hr events:   - NPO for procedure  - Chemical pleurodesis done ~13:30   - HR: Afib with RVR in the 110s  - 99% on 1-2L     Neurological   Pain control: Patient was transferred to ICU level of care for pain control. Will prepare PCA for likely need after chemical pleurodesis.   - Morphine PCA  - Morphine 2-4mg  PRN    Pulmonary   Empyema vs Parapneumonic effusion:??Empirically treated with antifungals from 7/27-7/29. CT on 7/22 showed new b/l ground glass opacities and R-sided pleural effusion. BAL cx's no growth at 4 days. Thoracentesis consistent with exudative effusion. Chest tube with bloody drainage, pulm does not feel it is consistent with hemothorax. Has been on 1-2L since chest tube placement. Completed 5 day course of prednisone and levofloxacin for possible COPD exacerbation. Pt and pulmonology in agreement to pursue chemical pleurodesis 8/7, was transfered to stepdown unit for pre and post procedural monitoring.  - Continue to wean O2 as tolerated  - Chest tube to water seal overnight and flush twice daily with 20cc sterile saline  - PRN albuterol nebulizers  - Continue Symbicort inhaler BID  - Pleurodesis with doxycycline today     Cardiovascular   A-fib with RVR: Has a h/o A-fib and is on diltiazem at home. Has been tachycardic to the 130 this admission. She was switched to metoprolol this admission due to concern to for interaction between diltiazem and future chemotherapy.  - Continue Metoprolol 12.5mg  q6hr    Renal   AKI: Resolved. Cr 0.84 today.   - 300 mg allopurinol daily    Infectious Disease/Autoimmune   NAI    FEN/GI   NPO for pleurodesis    Malnutrition Assessment: Not done yet.       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 Malen Wilson as an outpatient as well as Dr. Donneta Wilson locally in [place]. Was on Enasidenib since 06/2017 and this was discontinued at admission. FLT3 negative. Will follow-up with Dr. Malen Wilson regarding outpatient chemotherapy. Most likely no curative options, but might help improve her respiratory status.  - 300mg  allopurinol BID  - Daily??BMP??  - Daily??CBC w/ diff  - Transfuse for Hgb <7 and platelets <10  -??Transfuse cryoprecipitate for fibrinogen <150 if bleeding or consumptive process??  - Continue acyclovir for prophylaxis    Endocrine   Hyperglycemia:??Has required insulin in the setting of steroids. Will begin to wean insulin after discontinuing prednisone on 8/1. Required 4 units SSI yesterday.   - Continue 10U NPH BID  - SSI    Prophylaxis/LDA/Restraints/Consults   Can CVC be removed? No, port for chemo  Can A-line be removed? N/A, no A-line present  Can Foley  be removed? N/A, no Foley present  Mobility plan: Step 3 - Bed in chair position     Feeding: NPO for procedure  Analgesia: Pain adequately controlled  Sedation SAT/SBT: N/A  Thrombembolic ppx: Mechanical only, chemical contraindicated secondary to platelets <50  Head of bed: >30 deg  Ulcer ppx: Not indicated  Glucose within target range: Not in range, titrating medications    Patient Lines/Drains/Airways Status    Active Active Lines, Drains, & Airways     Name:   Placement date:   Placement time:   Site:   Days: Port A Cath 02/05/18 Left Subclavian   02/05/18    0732    Subclavian   69    Chest Drainage System 1 Right Pleural 14 Fr.   04/05/18    1430    Pleural   9              Patient Lines/Drains/Airways Status    Active Wounds     None                Goals of Care     Code Status: DNR, not DNI    Designated Healthcare Decision Maker:  [name] currently has decisional capacity for healthcare decision-making and is able to designate a surrogate healthcare decision maker. [name] designated healthcare decision maker(s) is/are Jessica Wilson (the patient's spouse) as denoted by stated patient preference.      Subjective     No complaints this morning. Decreased shortness of breath. No chest pain, no palpitations. No pain this AM.     Objective     Vitals - past 24 hours  Temp:  [36.6 ??C-37.2 ??C] 36.9 ??C  Heart Rate:  [91-135] 121  SpO2 Pulse:  [92-139] 118  Resp:  [16-27] 26  BP: (97-119)/(46-84) 115/84  SpO2:  [98 %-100 %] 100 % Intake/Output  I/O last 3 completed shifts:  In: 1210 [P.O.:660; Blood:300; IV Piggyback:250]  Out: 430 [Urine:400; Chest Tube:30]     Physical Exam:    General: Chronically ill appearing, oriented to person, place, situation  HEENT: ptosis of right eye  CV: Normal rate, regular rhythm. No murmurs appreciated  Pulm: Expiratory wheezes on the right side, unable to examine left side due to habitus and mobility   GI: Soft, non tender, non distended  MSK: No pedal edema      Continuous Infusions:   ??? MORPhine in 0.9 % NaCl     ??? sodium chloride 20 mL/hr (03/30/18 1600)   ??? sodium chloride         Scheduled Medications:   ??? acyclovir  400 mg Oral BID   ??? allopurinol  300 mg Oral Daily   ??? budesonide-formoterol  2 puff Inhalation BID (RT)   ??? buPROPion  150 mg Oral BID   ??? doyxcycline (VIBRAMYCIN) syringe (Intrapleural)  500 mg Intrapleural Once   ??? insulin lispro  0-12 Units Subcutaneous ACHS   ??? levoFLOXacin  500 mg Oral Q24H SCH   ??? lidocaine  100 mg Other Once   ??? magic mouthwash oral  10 mL Oral 4x Daily   ??? [MAR Hold] magnesium oxide  400 mg Oral BID   ??? melatonin  3 mg Oral QPM   ??? metoprolol tartrate  12.5 mg Oral Q6H SCH   ??? potassium & sodium phosphates 250mg   1 packet Oral 3x daily around food   ??? sertraline  100 mg Oral Daily   ??? sodium chloride  10 mL  Intravenous BID       PRN medications:  acetaminophen, ipratropium **AND** albuterol, cetirizine, loperamide, loperamide, MORPhine injection, naloxone, phenol, prochlorperazine, sodium chloride    Data/Imaging Review: Reviewed in Epic and personally interpreted on 04/15/2018. See EMR for detailed results.    I attest that I have reviewed the student note and that the components of the history of the present illness, the physical exam, and the assessment and plan documented were performed by me or were performed in my presence by the student where I verified the documentation and performed (or re-performed) the exam and medical decision making.     Glean Salen, PGY-1

## 2018-04-15 NOTE — Unmapped (Signed)
DIAGNOSIS: Malignant Pleural Effusion   PROCEDURE: Chemical Pleurodesis    SURGEON: Dr. Salli Real   ASSISTANTS: Dr. Maple Mirza   ANESTHESIA: 2 mg of Versed and 25 mg of fentanyl were used to achieve moderate conscious sedation    EBL: None    COMPLICATIONS: None       CONSENT/TIME OUT: Risks, benefits and alternatives were discussed with the patient. Written consent was obtained prior to the procedure and is detailed in the medical record. Prior to the start of the procedure, a time out was taken and the identity of the patient was confirmed via name, medical record number and  date of birth. The availability of the correct equipment was verified.    DESCRIPTION OF PROCEDURE: Chest tube on the right was already in place. 10 mL of 1% lidocaine (100 mg) was injected into the chest tube for local anesthetesia. 500 mg of doxycycline reconstituted in 50 mL of normal saline was then injected via the chest tube and followed by 10 mL of normal saline to clear the chest tube. The tube was clamped for 1 hour.    PLAN: The tube will remain to suction until the output decreases to less then 50 mLs/day.     Dr. Salli Real was present for the procedure.    Winona Sison R. Mikaila Grunert, DO  Interventional Pulmonary Fellow  Division of Pulmonary Diseases and Critical Care Medicine  Pager: [phone]

## 2018-04-15 NOTE — Telephone Encounter (Signed)
Contacted patient. Left msg. [facility].  Reviewed chart - patient currently inpatient via [facility] - for talc pleuradesis and possible pleurx.

## 2018-04-16 ENCOUNTER — Encounter: Payer: Self-pay | Admitting: Urology

## 2018-04-16 ENCOUNTER — Ambulatory Visit: Payer: Medicare Other | Admitting: Urology

## 2018-04-16 DIAGNOSIS — J9621 Acute and chronic respiratory failure with hypoxia: Principal | ICD-10-CM

## 2018-04-16 LAB — MANUAL DIFFERENTIAL
BASOPHILS - ABS (DIFF): 0 10*9/L (ref 0.0–0.1)
BASOPHILS - REL (DIFF): 0 %
BASOPHILS - REL (DIFF): 0 %
EOSINOPHILS - ABS (DIFF): 0 10*9/L (ref 0.0–0.4)
EOSINOPHILS - ABS (DIFF): 0 10*9/L (ref 0.0–0.4)
EOSINOPHILS - REL (DIFF): 0 %
EOSINOPHILS - REL (DIFF): 0 %
LYMPHOCYTES - ABS (DIFF): 1.1 10*9/L — ABNORMAL LOW (ref 1.5–5.0)
LYMPHOCYTES - ABS (DIFF): 1.8 10*9/L (ref 1.5–5.0)
LYMPHOCYTES - REL (DIFF): 25 %
MONOCYTES - ABS (DIFF): 5.1 10*9/L — ABNORMAL HIGH (ref 0.2–0.8)
MONOCYTES - REL (DIFF): 69 %
MONOCYTES - REL (DIFF): 72 %
NEUTROPHILS - ABS (DIFF): 0.1 10*9/L — CL (ref 2.0–7.5)
NEUTROPHILS - ABS (DIFF): 0.2 10*9/L — CL (ref 2.0–7.5)
NEUTROPHILS - REL (DIFF): 3 %
NEUTROPHILS - REL (DIFF): 3 %

## 2018-04-16 LAB — BASIC METABOLIC PANEL
ANION GAP: 6 mmol/L — ABNORMAL LOW (ref 9–15)
ANION GAP: 6 mmol/L — ABNORMAL LOW (ref 9–15)
BLOOD UREA NITROGEN: 23 mg/dL — ABNORMAL HIGH (ref 7–21)
BLOOD UREA NITROGEN: 27 mg/dL — ABNORMAL HIGH (ref 7–21)
BUN / CREAT RATIO: 18
CALCIUM: 8.4 mg/dL — ABNORMAL LOW (ref 8.5–10.2)
CHLORIDE: 109 mmol/L — ABNORMAL HIGH (ref 98–107)
CHLORIDE: 111 mmol/L — ABNORMAL HIGH (ref 98–107)
CO2: 24 mmol/L (ref 22.0–30.0)
CREATININE: 0.99 mg/dL (ref 0.60–1.00)
CREATININE: 1.49 mg/dL — ABNORMAL HIGH (ref 0.60–1.00)
EGFR CKD-EPI AA FEMALE: 64 mL/min/{1.73_m2} (ref >=60)
EGFR CKD-EPI NON-AA FEMALE: 34 mL/min/{1.73_m2} — ABNORMAL LOW (ref >=60)
EGFR CKD-EPI NON-AA FEMALE: 56 mL/min/{1.73_m2} — ABNORMAL LOW (ref >=60)
GLUCOSE RANDOM: 114 mg/dL (ref 65–179)
GLUCOSE RANDOM: 128 mg/dL (ref 65–179)
POTASSIUM: 4.3 mmol/L (ref 3.5–5.0)
POTASSIUM: 5.2 mmol/L — ABNORMAL HIGH (ref 3.5–5.0)
SODIUM: 141 mmol/L (ref 135–145)
SODIUM: 142 mmol/L (ref 135–145)

## 2018-04-16 LAB — CBC W/ AUTO DIFF
HEMATOCRIT: 26.6 % — ABNORMAL LOW (ref 36.0–46.0)
HEMOGLOBIN: 8.5 g/dL — ABNORMAL LOW (ref 12.0–16.0)
HEMOGLOBIN: 8.7 g/dL — ABNORMAL LOW (ref 12.0–16.0)
MEAN CORPUSCULAR HEMOGLOBIN CONC: 31.9 g/dL (ref 31.0–37.0)
MEAN CORPUSCULAR HEMOGLOBIN CONC: 32.1 g/dL (ref 31.0–37.0)
MEAN CORPUSCULAR HEMOGLOBIN: 30.5 pg (ref 26.0–34.0)
MEAN CORPUSCULAR HEMOGLOBIN: 31.1 pg (ref 26.0–34.0)
MEAN PLATELET VOLUME: 7 fL (ref 7.0–10.0)
MEAN PLATELET VOLUME: 7.3 fL (ref 7.0–10.0)
PLATELET COUNT: 11 10*9/L — ABNORMAL LOW (ref 150–440)
PLATELET COUNT: 12 10*9/L — ABNORMAL LOW (ref 150–440)
RED BLOOD CELL COUNT: 2.79 10*12/L — ABNORMAL LOW (ref 4.00–5.20)
RED CELL DISTRIBUTION WIDTH: 17 % — ABNORMAL HIGH (ref 12.0–15.0)
RED CELL DISTRIBUTION WIDTH: 16.8 % — ABNORMAL HIGH (ref 12.0–15.0)
WBC ADJUSTED: 3.8 10*9/L — ABNORMAL LOW (ref 4.5–11.0)
WBC ADJUSTED: 7.1 10*9/L (ref 4.5–11.0)

## 2018-04-16 LAB — MAGNESIUM
Magnesium:MCnc:Pt:Ser/Plas:Qn:: 1.8
Magnesium:MCnc:Pt:Ser/Plas:Qn:: 2.6 — ABNORMAL HIGH

## 2018-04-16 LAB — EGFR CKD-EPI AA FEMALE: Lab: 39 — ABNORMAL LOW

## 2018-04-16 LAB — THYROID STIMULATING HORMONE: Thyrotropin:ACnc:Pt:Ser/Plas:Qn:: 3.615 — ABNORMAL HIGH

## 2018-04-16 LAB — ALT (SGPT): Alanine aminotransferase:CCnc:Pt:Ser/Plas:Qn:: 30

## 2018-04-16 LAB — RED BLOOD CELL COUNT: Lab: 2.79 — ABNORMAL LOW

## 2018-04-16 LAB — PHOSPHORUS
Phosphate:MCnc:Pt:Ser/Plas:Qn:: 2.8 — ABNORMAL LOW
Phosphate:MCnc:Pt:Ser/Plas:Qn:: 2.9

## 2018-04-16 LAB — PRO-BNP: Natriuretic peptide.B prohormone N-Terminal:MCnc:Pt:Ser/Plas:Qn:: 11100 — ABNORMAL HIGH

## 2018-04-16 LAB — URIC ACID: Urate:MCnc:Pt:Ser/Plas:Qn:: 3.4

## 2018-04-16 LAB — SODIUM: Sodium:SCnc:Pt:Ser/Plas:Qn:: 142

## 2018-04-16 LAB — LACTATE BLOOD VENOUS: Lactate:SCnc:Pt:BldV:Qn:: 1.5

## 2018-04-16 LAB — MONOCYTES - ABS (DIFF): Lab: 5.1 — ABNORMAL HIGH

## 2018-04-16 LAB — AST (SGOT): Aspartate aminotransferase:CCnc:Pt:Ser/Plas:Qn:: 12 — ABNORMAL LOW

## 2018-04-16 LAB — HYPOCHROMIA

## 2018-04-16 LAB — LYMPHOCYTES - REL (DIFF): Lab: 28

## 2018-04-16 MED ORDER — AMIODARONE HCL 200 MG PO TABS
400.00 | ORAL_TABLET | ORAL | Status: DC
Start: 2018-04-16 — End: 2018-04-16

## 2018-04-16 MED ORDER — LEVALBUTEROL HCL 0.63 MG/3ML IN NEBU
0.63 | INHALATION_SOLUTION | RESPIRATORY_TRACT | Status: DC
Start: ? — End: 2018-04-16

## 2018-04-16 MED ORDER — MAGNESIUM OXIDE 400 MG PO TABS
800.00 | ORAL_TABLET | ORAL | Status: DC
Start: 2018-04-16 — End: 2018-04-16

## 2018-04-16 NOTE — Unmapped (Signed)
Pt remains drowsy to alert.  Arouses to voice.  No pain with demand only pca.  Remains afebrile, sats maintained above goal on 2L .  Bp labile along with HR.  Pt remains in afib this shift 100s-150s.  Pt received additional dose oral metoprolol and scheduled dose increased, along with 1 dose IV metoprolol and currently has 1 L NS bolus infusing.  Call parameters modified.  Pt up to bsc with standby assist.  Fall precautions maintained.  No skin breakdown noted.  Will cont to monitor.      Problem: Adult Inpatient Plan of Care  Goal: Plan of Care Review  Outcome: Progressing  Goal: Patient-Specific Goal (Individualization)  Outcome: Progressing  Goal: Absence of Hospital-Acquired Illness or Injury  Outcome: Progressing  Goal: Optimal Comfort and Wellbeing  Outcome: Progressing  Goal: Readiness for Transition of Care  Outcome: Progressing  Goal: Rounds/Family Conference  Outcome: Progressing     Problem: Self-Care Deficit  Goal: Improved Ability to Complete Activities of Daily Living  Outcome: Progressing     Problem: Fall Injury Risk  Goal: Absence of Fall and Fall-Related Injury  Outcome: Progressing     Problem: COPD Comorbidity  Goal: Maintenance of COPD Symptom Control  Outcome: Progressing     Problem: Hypertension Comorbidity  Goal: Blood Pressure in Desired Range  Outcome: Progressing     Problem: Obstructive Sleep Apnea Risk or Actual (Comorbidity Management)  Goal: Unobstructed Breathing During Sleep  Outcome: Progressing     Problem: Pain Chronic (Persistent) (Comorbidity Management)  Goal: Acceptable Pain Control and Functional Ability  Outcome: Progressing     Problem: Skin Injury Risk Increased  Goal: Skin Health and Integrity  Outcome: Progressing

## 2018-04-16 NOTE — Unmapped (Signed)
MICU Daily Progress Note     Date of Service: 04/16/2018    Problem List:   Principal Problem:    Acute and chronic respiratory failure with hypoxia (CMS-HCC)  Active Problems:    Acute myeloid leukemia not having achieved remission (CMS-HCC)  Resolved Problems:    * No resolved hospital problems. *      Interval history: Jessica Wilson is a 77 y.o. 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 She had increasing??R??malignant/paramalignant pleural effusion with significant dyspnea. A pigtail chest tube was placed on 04/05/18 with improvement in dyspnea. IP was consulted for PleurX catheter placement, but because her platelets could not be transfused to >50, opted for pleurodesis with doxycycline instead. She underwent procedure without difficulty. Persistently in a fib this hospitalization with multiple episodes of RVR, last this morning.     24hr events:   - Chemical pleurodesis done ~13:30   - HR: Afib with RVR in the 150s, hypotension after increased scheduled dose of metop  - 99% on 1-2L     Neurological   Pain control:   - Morphine PCA - may DC and switch to PRN morphine if no further intervention planned  - Morphine 2-4mg  PRN    Pulmonary   Empyema vs Parapneumonic effusion:??Empirically treated with antifungals from 7/27-7/29. CT on 7/22 showed new b/l ground glass opacities and R-sided pleural effusion. BAL cx's no growth at 4 days. Thoracentesis consistent with exudative effusion. Chest tube with bloody drainage, pulm does not feel it is consistent with hemothorax. Has been on 1-2L since chest tube placement. Completed 5 day course of prednisone and levofloxacin for possible COPD exacerbation. Pt and pulmonology in agreement to pursue chemical pleurodesis 8/7, was transfered to stepdown unit for pre and post procedural monitoring. S/p pleurodesis with doxycycline 8/7.   - Continue to wean O2 as tolerated  - Chest tube to water seal overnight and flush twice daily with 20cc sterile saline  - PRN albuterol nebulizers  - Continue Symbicort inhaler BID    Cardiovascular   A-fib with RVR: Has a h/o A-fib and is on diltiazem at home. Has been tachycardic to the 130 this admission. She was switched to metoprolol this admission due to concern to for interaction between diltiazem and future chemotherapy.  - Metoprolol increased to 50mg  q6, limited by hypotension   - start amiodarone, bolus with 150mg  over 60 minutes, then 400mg  TID  - plan to decrease metoprolol with initiation of amiodarone    Renal   AKI: Resolved. Cr 0.84 today.   - 300 mg allopurinol daily    Infectious Disease/Autoimmune   NAI    FEN/GI   Regular diet    Malnutrition Assessment: Not done yet.       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 Malen Gauze as an outpatient as well as Dr. Donneta Romberg locally in [place]. Was on Enasidenib since 06/2017 and this was discontinued at admission. FLT3 negative. Will follow-up with Dr. Malen Gauze regarding outpatient chemotherapy. Most likely no curative options, but might help improve her respiratory status.  - 300mg  allopurinol BID  - Daily??BMP??  - Daily??CBC w/ diff  - Transfuse for Hgb <7 and platelets <10  -??Transfuse cryoprecipitate for fibrinogen <150 if bleeding or consumptive process??  - Continue acyclovir for prophylaxis    Endocrine   Hyperglycemia:??Has required insulin in the setting of steroids. Will begin to wean insulin after discontinuing prednisone on 8/1. Required  4 units SSI yesterday.   - Continue 10U NPH BID  - SSI    Prophylaxis/LDA/Restraints/Consults   Can CVC be removed? No, port for chemo  Can A-line be removed? N/A, no A-line present  Can Foley be removed? N/A, no Foley present  Mobility plan: Step 3 - Bed in chair position     Feeding: Regular diet  Analgesia: Pain adequately controlled  Sedation SAT/SBT: N/A  Thrombembolic ppx: Mechanical only, chemical contraindicated secondary to platelets <50  Head of bed: >30 deg  Ulcer ppx: Not indicated  Glucose within target range: Not in range, titrating medications    Patient Lines/Drains/Airways Status    Active Active Lines, Drains, & Airways     Name:   Placement date:   Placement time:   Site:   Days:    Port A Cath 02/05/18 Left Subclavian   02/05/18    0732    Subclavian   70    Chest Drainage System 1 Right Pleural 14 Fr.   04/05/18    1430    Pleural   10    Peripheral IV 04/16/18 Right Forearm   04/16/18    1000    Forearm   less than 1              Patient Lines/Drains/Airways Status    Active Wounds     None                Goals of Care     Code Status: DNR, not DNI    Designated Healthcare Decision Maker:  [name] currently has decisional capacity for healthcare decision-making and is able to designate a surrogate healthcare decision maker. [name] designated healthcare decision maker(s) is/are Roxan Diesel (the patient's spouse) as denoted by stated patient preference.      Subjective     No complaints this morning. Decreased shortness of breath. No chest pain, no palpitations. No pain this AM.     Objective     Vitals - past 24 hours  Temp:  [36.5 ??C-37.1 ??C] 37.1 ??C  Heart Rate:  [105-156] 117  SpO2 Pulse:  [74-142] 110  Resp:  [18-34] 20  BP: (76-131)/(26-99) 84/63  SpO2:  [94 %-100 %] 99 % Intake/Output  I/O last 3 completed shifts:  In: 1241 [P.O.:1180; I.V.:1; IV Piggyback:60]  Out: 1350 [Urine:1100; Chest Tube:250]     Physical Exam:    General: Pleasant, appears tired, NAD  HEENT: ptosis of right eye, MMM  CV: Normal rate, irregularly irregular rhythm. No murmurs appreciated  Pulm: Faint crackles bilaterally, intermittent tachypnea   GI: Soft, non tender, non distended  MSK: No pedal edema      Continuous Infusions:   ??? MORPhine in 0.9 % NaCl     ??? sodium chloride 20 mL/hr (03/30/18 1600)   ??? sodium chloride         Scheduled Medications:   ??? acyclovir  400 mg Oral BID   ??? allopurinol  300 mg Oral Daily   ??? amioDAROne (CORDARONE) bolus (adult)  150 mg Intravenous Once   ??? amiodarone  400 mg Oral 3xd Meals   ??? budesonide-formoterol  2 puff Inhalation BID (RT)   ??? buPROPion  150 mg Oral BID   ??? doyxcycline (VIBRAMYCIN) syringe (Intrapleural)  500 mg Intrapleural Once   ??? insulin lispro  0-12 Units Subcutaneous ACHS   ??? lactated ringers  500 mL Intravenous Once   ??? levoFLOXacin  500 mg Oral Q24H SCH   ???  lidocaine  100 mg Other Once   ??? magic mouthwash oral  10 mL Oral 4x Daily   ??? magnesium oxide  800 mg Oral BID   ??? melatonin  3 mg Oral QPM   ??? metoprolol tartrate  50 mg Oral Q6H SCH   ??? sertraline  100 mg Oral Daily   ??? sodium chloride  10 mL Intravenous BID       PRN medications:  acetaminophen, cetirizine, levalbuterol, loperamide, loperamide, MORPhine injection, naloxone, phenol, prochlorperazine, sodium chloride    Data/Imaging Review: Reviewed in Epic and personally interpreted on 04/16/2018. See EMR for detailed results.

## 2018-04-16 NOTE — Unmapped (Signed)
PRN breathing treatment given per pt request and shortness of breathing. Breathing treatment stop half way due to increased HR. RN aware.

## 2018-04-17 DIAGNOSIS — J9621 Acute and chronic respiratory failure with hypoxia: Principal | ICD-10-CM

## 2018-04-17 LAB — BLOOD GAS, ARTERIAL
BASE EXCESS ARTERIAL: -4 — ABNORMAL LOW (ref -2.0–2.0)
BASE EXCESS ARTERIAL: -6.3 — ABNORMAL LOW (ref -2.0–2.0)
HCO3 ARTERIAL: 21 mmol/L — ABNORMAL LOW (ref 22–27)
O2 SATURATION ARTERIAL: 96.4 % (ref 94.0–100.0)
O2 SATURATION ARTERIAL: 97.9 % (ref 94.0–100.0)
PCO2 ARTERIAL: 40.4 mmHg (ref 35.0–45.0)
PCO2 ARTERIAL: 40.3 mmHg (ref 35.0–45.0)
PH ARTERIAL: 7.33 — ABNORMAL LOW (ref 7.35–7.45)
PO2 ARTERIAL: 81.4 mmHg (ref 80.0–110.0)
PO2 ARTERIAL: 101 mmHg (ref 80.0–110.0)

## 2018-04-17 LAB — BASIC METABOLIC PANEL
ANION GAP: 10 mmol/L (ref 9–15)
ANION GAP: 8 mmol/L — ABNORMAL LOW (ref 9–15)
ANION GAP: 9 mmol/L (ref 9–15)
ANION GAP: 9 mmol/L (ref 9–15)
BLOOD UREA NITROGEN: 34 mg/dL — ABNORMAL HIGH (ref 7–21)
BLOOD UREA NITROGEN: 36 mg/dL — ABNORMAL HIGH (ref 7–21)
BLOOD UREA NITROGEN: 40 mg/dL — ABNORMAL HIGH (ref 7–21)
BLOOD UREA NITROGEN: 44 mg/dL — ABNORMAL HIGH (ref 7–21)
BUN / CREAT RATIO: 17
BUN / CREAT RATIO: 18
CALCIUM: 7.9 mg/dL — ABNORMAL LOW (ref 8.5–10.2)
CALCIUM: 8 mg/dL — ABNORMAL LOW (ref 8.5–10.2)
CALCIUM: 8 mg/dL — ABNORMAL LOW (ref 8.5–10.2)
CALCIUM: 8 mg/dL — ABNORMAL LOW (ref 8.5–10.2)
CHLORIDE: 105 mmol/L (ref 98–107)
CHLORIDE: 106 mmol/L (ref 98–107)
CHLORIDE: 107 mmol/L (ref 98–107)
CHLORIDE: 107 mmol/L (ref 98–107)
CO2: 21 mmol/L — ABNORMAL LOW (ref 22.0–30.0)
CO2: 21 mmol/L — ABNORMAL LOW (ref 22.0–30.0)
CO2: 21 mmol/L — ABNORMAL LOW (ref 22.0–30.0)
CO2: 25 mmol/L (ref 22.0–30.0)
CREATININE: 2.13 mg/dL — ABNORMAL HIGH (ref 0.60–1.00)
CREATININE: 2.33 mg/dL — ABNORMAL HIGH (ref 0.60–1.00)
CREATININE: 2.47 mg/dL — ABNORMAL HIGH (ref 0.60–1.00)
EGFR CKD-EPI AA FEMALE: 21 mL/min/{1.73_m2} — ABNORMAL LOW (ref >=60)
EGFR CKD-EPI AA FEMALE: 25 mL/min/{1.73_m2} — ABNORMAL LOW (ref >=60)
EGFR CKD-EPI AA FEMALE: 27 mL/min/{1.73_m2} — ABNORMAL LOW (ref >=60)
EGFR CKD-EPI NON-AA FEMALE: 24 mL/min/{1.73_m2} — ABNORMAL LOW (ref >=60)
EGFR CKD-EPI NON-AA FEMALE: 22 mL/min/{1.73_m2} — ABNORMAL LOW (ref >=60)
EGFR CKD-EPI NON-AA FEMALE: 20 mL/min/{1.73_m2} — ABNORMAL LOW (ref >=60)
GLUCOSE RANDOM: 125 mg/dL (ref 65–179)
GLUCOSE RANDOM: 159 mg/dL (ref 65–179)
GLUCOSE RANDOM: 164 mg/dL (ref 65–179)
GLUCOSE RANDOM: 208 mg/dL — ABNORMAL HIGH (ref 65–179)
POTASSIUM: 5.4 mmol/L — ABNORMAL HIGH (ref 3.5–5.0)
POTASSIUM: 5.1 mmol/L — ABNORMAL HIGH (ref 3.5–5.0)
POTASSIUM: 5 mmol/L (ref 3.5–5.0)
SODIUM: 137 mmol/L (ref 135–145)
SODIUM: 137 mmol/L (ref 135–145)
SODIUM: 137 mmol/L (ref 135–145)
SODIUM: 138 mmol/L (ref 135–145)

## 2018-04-17 LAB — BLOOD GAS CRITICAL CARE PANEL, ARTERIAL
BASE EXCESS ARTERIAL: -4.6 — ABNORMAL LOW (ref -2.0–2.0)
CALCIUM IONIZED ARTERIAL (MG/DL): 4.9 mg/dL (ref 4.40–5.40)
FIO2 ARTERIAL: 50
GLUCOSE WHOLE BLOOD: 182 mg/dL
HCO3 ARTERIAL: 21 mmol/L — ABNORMAL LOW (ref 22–27)
HEMOGLOBIN BLOOD GAS: 10.3 g/dL — ABNORMAL LOW (ref 12.00–16.00)
LACTATE BLOOD ARTERIAL: 1.2 mmol/L (ref <=1.2)
O2 SATURATION ARTERIAL: 96.9 % (ref 94.0–100.0)
PH ARTERIAL: 7.27 — ABNORMAL LOW (ref 7.35–7.45)
PO2 ARTERIAL: 85.5 mmHg (ref 80.0–110.0)
SODIUM WHOLE BLOOD: 138 mmol/L (ref 135–145)

## 2018-04-17 LAB — PHOSPHORUS
PHOSPHORUS: 2.6 mg/dL — ABNORMAL LOW (ref 2.9–4.7)
Phosphate:MCnc:Pt:Ser/Plas:Qn:: 2.6 — ABNORMAL LOW
Phosphate:MCnc:Pt:Ser/Plas:Qn:: 2.8 — ABNORMAL LOW

## 2018-04-17 LAB — URINALYSIS
BILIRUBIN UA: NEGATIVE
KETONES UA: NEGATIVE
LEUKOCYTE ESTERASE UA: NEGATIVE
NITRITE UA: NEGATIVE
PROTEIN UA: 30 — AB
RBC UA: >182 /HPF — ABNORMAL HIGH (ref <=4)
SPECIFIC GRAVITY UA: 1.012 (ref 1.003–1.030)
SQUAMOUS EPITHELIAL: 1 /HPF (ref 0–5)
UROBILINOGEN UA: 0.2
WBC UA: 28 /HPF — ABNORMAL HIGH (ref 0–5)

## 2018-04-17 LAB — MAGNESIUM
Magnesium:MCnc:Pt:Ser/Plas:Qn:: 2.2
Magnesium:MCnc:Pt:Ser/Plas:Qn:: 2.2

## 2018-04-17 LAB — PRO-BNP: Natriuretic peptide.B prohormone N-Terminal:MCnc:Pt:Ser/Plas:Qn:: 11300 — ABNORMAL HIGH

## 2018-04-17 LAB — O2 SATURATION ARTERIAL
Oxygen saturation:MFr:Pt:BldA:Qn:: 96.4
Oxygen saturation:MFr:Pt:BldA:Qn:: 96.9
Oxygen saturation:MFr:Pt:BldA:Qn:: 97.9

## 2018-04-17 LAB — MEAN CORPUSCULAR HEMOGLOBIN CONC: Lab: 32.2

## 2018-04-17 LAB — CBC
HEMATOCRIT: 18.9 % — ABNORMAL LOW (ref 36.0–46.0)
HEMATOCRIT: 33.1 % — ABNORMAL LOW (ref 36.0–46.0)
HEMOGLOBIN: 10.6 g/dL — ABNORMAL LOW (ref 12.0–16.0)
MEAN CORPUSCULAR HEMOGLOBIN CONC: 32.2 g/dL (ref 31.0–37.0)
MEAN CORPUSCULAR HEMOGLOBIN: 31.5 pg (ref 26.0–34.0)
MEAN CORPUSCULAR HEMOGLOBIN: 29.8 pg (ref 26.0–34.0)
MEAN CORPUSCULAR VOLUME: 97.9 fL (ref 80.0–100.0)
MEAN PLATELET VOLUME: 8.5 fL (ref 7.0–10.0)
MEAN PLATELET VOLUME: 9.1 fL (ref 7.0–10.0)
PLATELET COUNT: 22 10*9/L — ABNORMAL LOW (ref 150–440)
PLATELET COUNT: 31 10*9/L — ABNORMAL LOW (ref 150–440)
RED BLOOD CELL COUNT: 1.93 10*12/L — ABNORMAL LOW (ref 4.00–5.20)
RED BLOOD CELL COUNT: 3.55 10*12/L — ABNORMAL LOW (ref 4.00–5.20)
RED CELL DISTRIBUTION WIDTH: 16.8 % — ABNORMAL HIGH (ref 12.0–15.0)
RED CELL DISTRIBUTION WIDTH: 17.9 % — ABNORMAL HIGH (ref 12.0–15.0)
WBC ADJUSTED: 9.4 10*9/L (ref 4.5–11.0)
WBC ADJUSTED: 18 10*9/L — ABNORMAL HIGH (ref 4.5–11.0)

## 2018-04-17 LAB — CBC W/ AUTO DIFF
HEMATOCRIT: 26.1 % — ABNORMAL LOW (ref 36.0–46.0)
HEMOGLOBIN: 8.4 g/dL — ABNORMAL LOW (ref 12.0–16.0)
MEAN CORPUSCULAR HEMOGLOBIN CONC: 32.1 g/dL (ref 31.0–37.0)
MEAN CORPUSCULAR VOLUME: 96.3 fL (ref 80.0–100.0)
MEAN PLATELET VOLUME: 9 fL (ref 7.0–10.0)
PLATELET COUNT: 9 10*9/L — CL (ref 150–440)
RED BLOOD CELL COUNT: 2.7 10*12/L — ABNORMAL LOW (ref 4.00–5.20)
RED CELL DISTRIBUTION WIDTH: 16.6 % — ABNORMAL HIGH (ref 12.0–15.0)
WBC ADJUSTED: 8.8 10*9/L (ref 4.5–11.0)

## 2018-04-17 LAB — POTASSIUM
Potassium:SCnc:Pt:Ser/Plas:Qn:: 5
Potassium:SCnc:Pt:Ser/Plas:Qn:: 5.1 — ABNORMAL HIGH

## 2018-04-17 LAB — BLOOD UREA NITROGEN: Urea nitrogen:MCnc:Pt:Ser/Plas:Qn:: 40 — ABNORMAL HIGH

## 2018-04-17 LAB — MANUAL DIFFERENTIAL
BASOPHILS - ABS (DIFF): 0 10*9/L (ref 0.0–0.1)
EOSINOPHILS - ABS (DIFF): 0 10*9/L (ref 0.0–0.4)
EOSINOPHILS - REL (DIFF): 0 %
LYMPHOCYTES - ABS (DIFF): 1.3 10*9/L — ABNORMAL LOW (ref 1.5–5.0)
LYMPHOCYTES - REL (DIFF): 15 %
MONOCYTES - ABS (DIFF): 7 10*9/L — ABNORMAL HIGH (ref 0.2–0.8)
NEUTROPHILS - ABS (DIFF): 0.4 10*9/L — CL (ref 2.0–7.5)
NEUTROPHILS - REL (DIFF): 5 %

## 2018-04-17 LAB — BILIRUBIN TOTAL: Bilirubin:MCnc:Pt:Ser/Plas:Qn:: 0.9

## 2018-04-17 LAB — LACTATE BLOOD ARTERIAL
Lactate:SCnc:Pt:BldA:Qn:: 0.9
Lactate:SCnc:Pt:BldA:Qn:: 1.1

## 2018-04-17 LAB — HEPATIC FUNCTION PANEL
ALBUMIN: 2.7 g/dL — ABNORMAL LOW (ref 3.5–5.0)
ALBUMIN: 2.8 g/dL — ABNORMAL LOW (ref 3.5–5.0)
ALKALINE PHOSPHATASE: 89 U/L (ref 38–126)
ALKALINE PHOSPHATASE: 106 U/L (ref 38–126)
ALT (SGPT): 35 U/L (ref 15–48)
AST (SGOT): 17 U/L (ref 14–38)
AST (SGOT): 36 U/L (ref 14–38)
BILIRUBIN DIRECT: 0.6 mg/dL — ABNORMAL HIGH (ref 0.00–0.40)
BILIRUBIN TOTAL: 1.1 mg/dL (ref 0.0–1.2)

## 2018-04-17 LAB — TROPONIN I
TROPONIN I: <0.034 ng/mL (ref <0.034)
Troponin I.cardiac:MCnc:Pt:Ser/Plas:Qn:: <0.034

## 2018-04-17 LAB — CO2: Carbon dioxide:SCnc:Pt:Ser/Plas:Qn:: 25

## 2018-04-17 LAB — NEUTROPHILS - ABS (DIFF): Lab: 0.4 — CL

## 2018-04-17 LAB — URIC ACID
Urate:MCnc:Pt:Ser/Plas:Qn:: 4.3
Urate:MCnc:Pt:Ser/Plas:Qn:: 5

## 2018-04-17 LAB — ALKALINE PHOSPHATASE: Alkaline phosphatase:CCnc:Pt:Ser/Plas:Qn:: 106

## 2018-04-17 LAB — LEUKOCYTE ESTERASE UA: Lab: NEGATIVE

## 2018-04-17 LAB — RED BLOOD CELL COUNT: Lab: 3.55 — ABNORMAL LOW

## 2018-04-17 LAB — RED CELL DISTRIBUTION WIDTH: Lab: 16.6 — ABNORMAL HIGH

## 2018-04-17 LAB — VANCOMYCIN RANDOM: Vancomycin^random:MCnc:Pt:Ser/Plas:Qn:: 19.6

## 2018-04-17 NOTE — Unmapped (Addendum)
P t remains drowsy, but easily aroused and oriented x4.  No c/o pain this shift.  Remains afebrile this shift.  BP labile, 70s-120s systolic this shift. Afib on monitor, 100-120s and up to 130s with activity or coughing.  Non-productive cough and crackles noted.   Sats maintained on cpap 35% or 2L [place].  Received 1 unit platelets.  Pt transferred to MICU wither continued c/o SOB on cpap, labile bp, and very low UO.         Problem: Adult Inpatient Plan of Care  Goal: Absence of Hospital-Acquired Illness or Injury  Outcome: Progressing     Problem: Fall Injury Risk  Goal: Absence of Fall and Fall-Related Injury  Outcome: Progressing     Problem: Obstructive Sleep Apnea Risk or Actual (Comorbidity Management)  Goal: Unobstructed Breathing During Sleep  Outcome: Progressing     Problem: Pain Chronic (Persistent) (Comorbidity Management)  Goal: Acceptable Pain Control and Functional Ability  Outcome: Progressing     Problem: Skin Injury Risk Increased  Goal: Skin Health and Integrity  Outcome: Progressing

## 2018-04-17 NOTE — Unmapped (Signed)
INTERVENTIONAL PULMONOLOGY FOLLOW-UP CONSULT NOTE    Assessment:     Jessica Wilson is a 77 year old woman with myelodysplastic syndrome which progressed to AML who was admitted due to hypoxemic respiratory failure. She had increasing R malignant/paramalignant pleural effusion with significant dyspnea. A pigtail chest tube was placed on 04/05/18 with improvement in dyspnea. A PleurX catheter was considered given recurrent effusion, but due to her refractory thrombocytopenia which increases risk of PleurX placement, decision was made to proceed with pleurodesis with doxycycline.    Now s/p pleurodesis with doxycycline 8/7. Tolerated this well, no significant pain. Chest tube had 250 mL of output past 24 hrs. Of note, has had worsening dyspnea the past day which primary team has attributed to 1.5 L of IV fluid she received in setting of hypotension as well as a fib with RVR. Feeling more comfortable when seen this afternoon.    Plan:     1. Maintain chest tube to water seal.    2. Plan to remove chest tube once output is less than 100 mL per day.    This patient was seen and discussed with Dr. Maple Mirza who agreed with the above assessment and plan. Please call the Interventional Pulmonary fellow at [phone] with any questions.    Subjective:     S/p pleurodesis with doxycycline yesterday. Tolerated well, no significant pain. She does note increased dyspnea today, which primary team attributes to receiving 1.5L of IV fluids she received for hypotension. She also went into a fib with RVR, and required multiple doses of IV metoprolol and is now receiving amio. No fevers, chills.    Objective:     Physical Examination:     BP 100/53  - Pulse 109  - Temp 36.7 ??C (Oral)  - Resp 25  - Ht 160 cm (5' 2.99)  - Wt 66.4 kg (146 lb 6.2 oz)  - SpO2 97%  - BMI 25.94 kg/m??    General appearance - lying in bed, no distress  Heart - Tachy rate, irregular rhythm  Chest - no increased WOB. b/l basilar crackles, no wheezes  Abdomen - soft, nontender  Extremities - No pedal edema, no clubbing or cyanosis    Labs and Imaging:  personally reviewed.

## 2018-04-17 NOTE — Unmapped (Signed)
Pt given PRN treatments for SOB, BBS diminished faint expiratory wheezes. Pt placed on CPAP tonight due to pulmonary edema and increased work of breathing. Will cont to monitor.

## 2018-04-17 NOTE — Unmapped (Signed)
Pt transferred to MICU from [facility] on previous shift as a rapid response for afib with RVR, hypotension and increased work orf breathing.  An arterial line was placed by the APP and neosynephrin gtt was initiated and titrated to keep MAP's > 65.  Pt received two units of blood for Hgb of 6.1.  A foley catheter was placed for accurate output.  Pt's urine output initially brown and scant, became clearer and lighter as the shift went on with fluids and blood.  Pt pan cultured for a tmax of 38.  Pt's intake diminished this shift, able to take meds whole in applesauce.  Chaplain paged for pt's expressed feelings of impending doom and death.  Pt shared wishes to not be intubated with RN and provider and orders changed to reflect her stated wishes.  Pt has remained in a safe environment with bed in low locked position and side rails up X 4 and call bell in reach.  No falls noted this shift.  Pt repositioned every two hours for comfort and to maintain skin integrity.  Pt and family updated to POC and all interventions and meds explained prior to implementation.  Pt and family verbalize understanding of RN instructions.

## 2018-04-17 NOTE — Unmapped (Signed)
Vancomycin Therapeutic Monitoring Pharmacy Note    Jessica Wilson is a 77 y.o. female starting vancomycin. Date of therapy initiation: 04/17/18    Indication: sepsis in the setting of functional neutropenia    Prior Dosing Information: None/new initiation     Goals:  Therapeutic Drug Levels  Vancomycin trough goal: 15-20 mg/L    Additional Clinical Monitoring/Outcomes  Renal function, volume status (intake and output)    Results: Not applicable    Wt Readings from Last 1 Encounters:   04/16/18 55.2 kg (121 lb 11.1 oz)     Creatinine   Date Value Ref Range Status   04/17/2018 2.13 (H) 0.60 - 1.00 mg/dL Final   16/06/9603 5.40 (H) 0.60 - 1.00 mg/dL Final   98/07/9146 8.29 (H) 0.60 - 1.00 mg/dL Final        Pharmacokinetic Considerations and Significant Drug Interactions:  ? patient has new AKI with doubling of SCr in last 24 hours, will dose by levels  ? Concurrent nephrotoxic meds: not applicable    Assessment/Plan:  Recommended Dose  ? administer loading dose of 2000mg  x 1  ? Estimated trough on recommended regimen: Not applicable - dosing by level    Follow-up  ? Level due: Saturday, 04/18/18 with scheduled labs  ? A pharmacist will continue to monitor and order levels as appropriate    Please page service pharmacist with questions/clarifications.    Charleen Kirks, PharmD

## 2018-04-17 NOTE — Unmapped (Addendum)
A&Ox4. Afib 130s-150s at the beginning of the shift. BPs 90s-110s systolic. Pt received 5mg  IV metop and 50mg  PO metop mid morning. HR dropped to the 110s-120s, and BPs 80s systolic - bolus started. BPs dropped to 60s systolic (asymptomatic) so rapid response initiated. Additional bolus given - BPs improved to the 80s-100s systolic and IV amiodarone bolus given. HR maintained in the 100s-120s the rest of the afternoon, pt received another bolus in the afternoon for soft BPs. UO this shift - MDI aware. In the early evening MDI to the bedside and suspected pt had pulmonary edema - CPAP placed and 20mg  IV lasix given. On 4L [place] in the morning - pt wheezing and reporting SOB unrelieved by respiratory treatments. Breathing improved in the afternoon and turned down to 2L [place] later in the shfit. 60mL output via chest tube. No c/o pain. Morphine PCA d/c'd. No BM. Visitor at the bedside in the early afternoon. Free from falls/injury so far this shift. ROUNDS q2hrs. Will continue to monitor.       Problem: Adult Inpatient Plan of Care  Goal: Plan of Care Review  Outcome: Progressing  Goal: Patient-Specific Goal (Individualization)  Outcome: Progressing  Goal: Absence of Hospital-Acquired Illness or Injury  Outcome: Progressing  Goal: Optimal Comfort and Wellbeing  Outcome: Progressing  Goal: Readiness for Transition of Care  Outcome: Progressing  Goal: Rounds/Family Conference  Outcome: Progressing     Problem: Self-Care Deficit  Goal: Improved Ability to Complete Activities of Daily Living  Outcome: Progressing     Problem: Fall Injury Risk  Goal: Absence of Fall and Fall-Related Injury  Outcome: Progressing     Problem: COPD Comorbidity  Goal: Maintenance of COPD Symptom Control  Outcome: Progressing     Problem: Hypertension Comorbidity  Goal: Blood Pressure in Desired Range  Outcome: Progressing     Problem: Obstructive Sleep Apnea Risk or Actual (Comorbidity Management)  Goal: Unobstructed Breathing During Sleep  Outcome: Progressing     Problem: Pain Chronic (Persistent) (Comorbidity Management)  Goal: Acceptable Pain Control and Functional Ability  Outcome: Progressing     Problem: Skin Injury Risk Increased  Goal: Skin Health and Integrity  Outcome: Progressing

## 2018-04-17 NOTE — Unmapped (Signed)
MICU Daily Progress Note     Date of Service: 04/17/2018    Problem List:   Principal Problem:    Acute and chronic respiratory failure with hypoxia (CMS-HCC)  Active Problems:    Acute myeloid leukemia not having achieved remission (CMS-HCC)    Paroxysmal atrial fibrillation with RVR (CMS-HCC)    AKI (acute kidney injury) (CMS-HCC)    Malignant pleural effusion    Hyperglycemia  Resolved Problems:    * No resolved hospital problems. *      Interval history: Malachi Carl is a 77 y.o. 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 She had increasing??R??malignant/paramalignant pleural effusion with significant dyspnea. A pigtail chest tube was placed on 04/05/18 with improvement in dyspnea. IP was consulted for PleurX catheter placement, but because her platelets could not be transfused to >50, opted for pleurodesis with doxycycline instead. She underwent procedure without difficulty. Persistently in a fib this hospitalization with multiple episodes of RVR, 8/8.     24hr events: inc WOB overnight, pain on insp LUQ, cpap    Dispo: ICU    Neurological   Acute pain:   - pain adeq controlled  - previously on morphine PCA  - Morphine 2-4mg  PRN    Pulmonary   Empyema vs Parapneumonic effusion:??Empirically treated with antifungals from 7/27-7/29. CT on 7/22 showed new b/l ground glass opacities and R-sided pleural effusion. BAL cx's no growth at 4 days. Thoracentesis consistent with exudative effusion. Chest tube with bloody drainage, pulm does not feel it is consistent with hemothorax. Has been on 1-2L since chest tube placement. Completed 5 day course of prednisone and levofloxacin for possible COPD exacerbation. Pt and pulmonology in agreement to pursue chemical pleurodesis 8/7, was transfered to stepdown unit for pre and post procedural monitoring. S/p pleurodesis with doxycycline 8/7.   - Continue to wean O2 as tolerated  - Rt Chest tube to water seal, flush twice daily with 20cc sterile saline, minimal serosang drng  - PRN albuterol nebulizers  - Continue Symbicort inhaler BID  - Pt refused cpap this morning requesting Tarnov, transitioned to HFNC tolerating well  - discussed code status w patient sister >> pt desires DNR DNI sister and spouse are in agreement w patient wishes.  -will discuss palliative consult w pt and family    Cardiovascular   A-fib with RVR: Has a h/o A-fib and is on diltiazem at home. Has been tachycardic to the 130 this admission. She was switched to metoprolol this admission due to concern to for interaction between diltiazem and future chemotherapy.  - previously on metop, d/c'd after amio added and d/t soft pressures  - start amiodarone, bolus with 150mg  over 60 minutes, then 400mg  TID  - phenylephrine for MAP>65  - echo 03/2018: EF wnl w grade 2 diast dysfxn    Renal   AKI:   - adeq inadeq  - foley for accurate I/O during critical illness (pure wick unsuccessful)  - goal euvolemia   - replete electrolytes prn and monitor daily  - given lasix this am w/o good effect, small fluid bolus (250) given for mild hypotension and low dark uop   - 300 mg allopurinol daily    Infectious Disease/Autoimmune   #SIRS ?pulm source  - afebrile, relative leukocytosis in immunosuppressed pt  - changed levaquin to vanc and cefepime 8/9 for HAP cvg  - f/u pharm recs for van dosing  - pan cx for T>38.2   -  Continue acyclovir for prophylaxis      FEN/GI   #no active issues  - poor po intake, last BM on 8/7  - poor intake oral diet  - no gi ppx since taking po  - prn bowel regimen      Malnutrition Assessment: Not done yet.       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 Malen Gauze as an outpatient as well as Dr. Donneta Romberg locally in [place]. Was on Enasidenib since 06/2017 and this was discontinued at admission. FLT3 negative. Will follow-up with Dr. Malen Gauze regarding outpatient chemotherapy. Most likely no curative options, but might help improve her respiratory status.  - 300mg  allopurinol BID  - Daily??BMP??  - Daily??CBC w/ diff  - Transfuse for Hgb <7 and platelets <10 (2 units prbc and 1 plt given 8/9)  -??Transfuse cryoprecipitate for fibrinogen <150 if bleeding or consumptive process??  - Continue acyclovir for prophylaxis    Endocrine   Hyperglycemia:??Has required insulin in the setting of steroids. Will begin to wean insulin after discontinuing prednisone on 8/1. Required 4 units SSI yesterday.   - holding NPH  - cont SSI    Prophylaxis/LDA/Restraints/Consults   Can CVC be removed? No, port for chemo  Can A-line be removed? No: inadequate non-invasive pressure monitoring  Can Foley be removed? N/A, no Foley present  Mobility plan: Step 3 - Bed in chair position     Feeding: Regular diet  Analgesia: Pain adequately controlled  Sedation SAT/SBT: N/A  Thrombembolic ppx: Mechanical only, chemical contraindicated secondary to platelets <50  Head of bed: >30 deg  Ulcer ppx: Not indicated  Glucose within target range: Yes, in range    Patient Lines/Drains/Airways Status    Active Active Lines, Drains, & Airways     Name:   Placement date:   Placement time:   Site:   Days:    Port A Cath 02/05/18 Left Subclavian   02/05/18    0732    Subclavian   71    Chest Drainage System 1 Right Pleural 14 Fr.   04/05/18    1430    Pleural   12    Female External Urinary Device 04/16/18   04/16/18    ???     1    Peripheral IV 04/16/18 Right Forearm   04/16/18    1000    Forearm   1              Patient Lines/Drains/Airways Status    Active Wounds     None                Goals of Care     Code Status: DNR, not DNI    Designated Healthcare Decision Maker:  [name] currently has decisional capacity for healthcare decision-making and is able to designate a surrogate healthcare decision maker. [name] designated healthcare decision maker(s) is/are Roxan Diesel (the patient's spouse) as denoted by stated patient preference.      Subjective     As above in 24hr events    Objective     Vitals - past 24 hours  Temp:  [36.4 ??C (97.5 ??F)-37.3 ??C (99.1 ??F)] 36.4 ??C (97.5 ??F)  Heart Rate:  [94-143] 132  SpO2 Pulse:  [96-139] 125  Resp:  [16-34] 28  BP: (71-129)/(31-89) 82/57  FiO2 (%):  [28 %-40 %] 40 %  SpO2:  [85 %-99 %] 97 % Intake/Output  I/O last 3 completed shifts:  In: 2855 [  P.O.:940; I.V.:107; Blood:308; IV Piggyback:1500]  Out: 815 [Urine:635; Chest Tube:180]     Physical Exam:    General: slight inc WOB  ENT: MMM  CV: tachy irregularly irregular rhythm. No m/r/c/g  Pulm: scattered rhonchi L>R, dec bases,   GI: Soft, non tender, obese abd  MSK: No BLE edema    Neuro exam:   Mental status: GCS e4, v4, m6, intmt confusion, talking to people not in the room  Speech/Language: names/repeats, dyspnea  Cranial nerves: Lt pupil 3Br, Rt pupil NR and fixed (congenital), Lt EOMi, VFF, gaze mdl,  V1-3 intact, face symm, uvula mdl/upgoing, tong mdl, shoulders full/equal   Motor: mod strength x 4drift, bulk and tone   Sensory: intact       Continuous Infusions:   ??? phenylephrine HCl in 0.9% NaCl 100 mcg/min (04/17/18 1410)   ??? sodium chloride 20 mL/hr (03/30/18 1600)   ??? sodium chloride     ??? sodium chloride         Scheduled Medications:   ??? acyclovir  200 mg Oral BID   ??? allopurinol  100 mg Oral Daily   ??? alteplase  2 mg Intravenous Once   ??? amiodarone  400 mg Oral 3xd Meals   ??? budesonide-formoterol  2 puff Inhalation BID (RT)   ??? buPROPion  100 mg Oral BID   ??? Cefepime  1 g Intravenous Q12H   ??? insulin lispro  0-12 Units Subcutaneous ACHS   ??? levalbuterol  0.63 mg Nebulization Q6H (RT)   ??? magic mouthwash oral  10 mL Oral 4x Daily   ??? magnesium oxide  800 mg Oral BID   ??? melatonin  3 mg Oral QPM   ??? sertraline  100 mg Oral Daily   ??? sodium chloride  10 mL Intravenous BID       PRN medications:  acetaminophen, cetirizine, levalbuterol, loperamide, loperamide, MORPhine injection, MORPhine injection, phenol, prochlorperazine, sodium chloride    Data/Imaging Review: Reviewed in Epic and personally interpreted on 04/17/2018. See EMR for detailed results.      Critical Care Attestation     This patient is critically ill or injured with the impairment of vital organ systems such that there is a high probability of imminent or life threatening deterioration in the patient's condition. This patient must remain in the ICU for ongoing evaluation of the comprehensive management plan outlined in this note. I directly provided critical care services as documented in this note and the critical care time spent (40 min) is exclusive of separately billable procedures.    Pharrell Ledford Fonnie Mu, ACNP

## 2018-04-17 NOTE — Unmapped (Signed)
Pt transferred from [facility] this shift. On 2 lpm Jessica Wilson and tolerating well. This RT encouraging CPAP, but pt somewhat reluctant. NAD.

## 2018-04-18 DIAGNOSIS — J9621 Acute and chronic respiratory failure with hypoxia: Principal | ICD-10-CM

## 2018-04-18 LAB — BLOOD GAS CRITICAL CARE PANEL, ARTERIAL
CALCIUM IONIZED ARTERIAL (MG/DL): 4.94 mg/dL (ref 4.40–5.40)
FIO2 ARTERIAL: 60
GLUCOSE WHOLE BLOOD: 171 mg/dL
HCO3 ARTERIAL: 20 mmol/L — ABNORMAL LOW (ref 22–27)
HEMOGLOBIN BLOOD GAS: 10.5 g/dL — ABNORMAL LOW (ref 12.00–16.00)
LACTATE BLOOD ARTERIAL: 1.3 mmol/L — ABNORMAL HIGH (ref <=1.2)
O2 SATURATION ARTERIAL: 96.9 % (ref 94.0–100.0)
PCO2 ARTERIAL: 46.3 mmHg — ABNORMAL HIGH (ref 35.0–45.0)
PH ARTERIAL: 7.26 — ABNORMAL LOW (ref 7.35–7.45)
PO2 ARTERIAL: 89.5 mmHg (ref 80.0–110.0)
SODIUM WHOLE BLOOD: 137 mmol/L (ref 135–145)

## 2018-04-18 LAB — BASIC METABOLIC PANEL
BLOOD UREA NITROGEN: 46 mg/dL — ABNORMAL HIGH (ref 7–21)
BUN / CREAT RATIO: 20
CALCIUM: 8.1 mg/dL — ABNORMAL LOW (ref 8.5–10.2)
CHLORIDE: 107 mmol/L (ref 98–107)
CO2: 19 mmol/L — ABNORMAL LOW (ref 22.0–30.0)
CREATININE: 2.34 mg/dL — ABNORMAL HIGH (ref 0.60–1.00)
EGFR CKD-EPI AA FEMALE: 23 mL/min/{1.73_m2} — ABNORMAL LOW (ref >=60)
GLUCOSE RANDOM: 155 mg/dL (ref 65–179)
POTASSIUM: 4.8 mmol/L (ref 3.5–5.0)
SODIUM: 136 mmol/L (ref 135–145)

## 2018-04-18 LAB — RED BLOOD CELL COUNT: Lab: 3.48 — ABNORMAL LOW

## 2018-04-18 LAB — CO2: Carbon dioxide:SCnc:Pt:Ser/Plas:Qn:: 19 — ABNORMAL LOW

## 2018-04-18 LAB — MANUAL DIFFERENTIAL
LYMPHOCYTES - ABS (DIFF): 2.3 10*9/L (ref 1.5–5.0)
MONOCYTES - REL (DIFF): 87 %
NEUTROPHILS - ABS (DIFF): 0.4 10*9/L — CL (ref 2.0–7.5)

## 2018-04-18 LAB — CBC W/ AUTO DIFF
HEMATOCRIT: 33.1 % — ABNORMAL LOW (ref 36.0–46.0)
HEMOGLOBIN: 10.3 g/dL — ABNORMAL LOW (ref 12.0–16.0)
MEAN CORPUSCULAR HEMOGLOBIN CONC: 31.1 g/dL (ref 31.0–37.0)
MEAN CORPUSCULAR HEMOGLOBIN: 29.6 pg (ref 26.0–34.0)
MEAN PLATELET VOLUME: 10.3 fL — ABNORMAL HIGH (ref 7.0–10.0)
PLATELET COUNT: 20 10*9/L — ABNORMAL LOW (ref 150–440)
RED BLOOD CELL COUNT: 3.48 10*12/L — ABNORMAL LOW (ref 4.00–5.20)
RED CELL DISTRIBUTION WIDTH: 17.6 % — ABNORMAL HIGH (ref 12.0–15.0)

## 2018-04-18 LAB — MONOCYTES - REL (DIFF): Lab: 87

## 2018-04-18 LAB — HCO3 ARTERIAL: Bicarbonate:SCnc:Pt:BldA:Qn:: 20 — ABNORMAL LOW

## 2018-04-18 NOTE — Unmapped (Signed)
Complains of SOB but refuses CPAP.

## 2018-04-18 NOTE — Unmapped (Signed)
INTERVENTIONAL PULMONOLOGY FOLLOW-UP CONSULT NOTE    Assessment:     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 She had increasing??R??malignant/paramalignant pleural effusion with significant dyspnea. A pigtail chest tube was placed on 04/05/18 with improvement in dyspnea. A PleurX catheter was considered given recurrent effusion, but due to her refractory thrombocytopenia which increases risk of PleurX placement, decision was made to proceed with pleurodesis with doxycycline.3    Chest tube output has decreased but chest X-ray shows basilar opacities. I flushed [name] chest tube both ways today to confirm it is patent and will follow-up chest tube output tomorrow. Her chest tube was placed to water seal, please place it back on suction. If output has decreased to less than 100 mL in 24 hours, we will plan to remove her chest tube.    Plan:     1. Place chest tube to suction at -20 cm H20    2. Monitor chest tube output.    3. Obtain daily chest X-ray while chest tube is in place.    Please call the Interventional Pulmonary fellow at [phone] with any questions.    Jevan Gaunt R. Maple Mirza, DO  Interventional Pulmonary Fellow  Clinical Instructor  Division of Pulmonary Diseases and Critical Care Medicine  Pager [phone]        Subjective:     S: [name] says she is short of breath and feels the high flow oxygen helps some. She denies chest pain or pressure.     Objective:     Physical Examination:     BP 82/57  - Pulse 123  - Temp 36.4 ??C (97.5 ??F) (Oral)  - Resp 24  - Ht 160 cm (5' 2.99)  - Wt 55.2 kg (121 lb 11.1 oz)  - SpO2 97%  - BMI 21.56 kg/m??    General appearance - chronically ill appearing  Heart - irregularly irregular rhythm with rate 120's  Chest - decreased air entry diffusely  Abdomen - soft  Extremities - No pedal edema, no clubbing or cyanosis    Labs and Imaging:    Chest X-ray 04/17/18: Left lower chest pigtail catheter mildly retracted. Stable left chest port catheter.    Increased right and similar left basilar opacities.    Moderate right and trace left pleural effusions.    Stable cardiomediastinal silhouette.

## 2018-04-18 NOTE — Unmapped (Signed)
Oriented X 2-3. Confused @ times. Restless and talking to herself and others not present in the room. Afib up to 150's md notified. Phenyl weaned off.

## 2018-04-18 NOTE — Unmapped (Signed)
INTERVENTIONAL PULMONOLOGY FOLLOW-UP CONSULT NOTE    Assessment:     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 She had increasing??R??malignant/paramalignant pleural effusion with significant dyspnea. A pigtail chest tube was placed on 04/05/18 with improvement in dyspnea. A PleurX catheter was considered given recurrent effusion, but due to her refractory thrombocytopenia which increases risk of PleurX placement, decision was made to proceed with pleurodesis with doxycycline.3    Overnight, chest tube output was less than 100 cc, however in the morning it was flushed and 200 cc of fluid came out.  It is likely that she has more fluid that can come out of the chest tube and therefore we will leave the chest tube to suction at the moment pending goals of care discussion with the family.    Plan:     1. Place chest tube to suction at -20 cm H20    2. Monitor chest tube output.    3. Obtain daily chest X-ray while chest tube is in place.    Please call the Interventional Pulmonary fellow at [phone] with any questions.    Maia Plan, MD  PGY 4, Pulmonary and Critical Care  Pager: [phone]  April 18, 2018 2:33 PM         Subjective:     She complains of being short of breath and that she is not able to take in good air.  Denies having any chest pain however does not feel good right now.    Objective:     Physical Examination:     BP 82/57  - Pulse 139  - Temp 36.4 ??C (Oral)  - Resp 15  - Ht 160 cm (5' 2.99)  - Wt 55.2 kg (121 lb 11.1 oz)  - SpO2 98%  - BMI 21.56 kg/m??    General appearance -appears in distress, on high flow nasal cannula, using accessory muscles.  Heart -heart rate irregular, ranging between 90 and  Chest -coarse breath sounds bilaterally with reduced air entry both the bases  Abdomen - soft, distended at baseline  Extremities - No pedal edema, no clubbing or cyanosis    Labs and Imaging:    Chest X-ray 04/18/18: Pulmonary edema with pleural effusions bilaterally.  Some component of atelectasis present as well however consolidation cannot be ruled out.

## 2018-04-18 NOTE — Unmapped (Signed)
Arterial Line Insertion Procedure Note     Date of Service: 04/17/2018    Patient Name:: Jessica Wilson  Patient MRN: [id number]    Indications: Hemodynamic monitoring    Procedure Details:   Informed consent was obtained verbally by phone from the patient's sister after explanation of the risks and benefits of the procedure, refer to the consent documentation.    Time-out was performed immediately prior to the procedure to verify correct patient, procedure, site, positioning, and special equipment if applicable.    Freida Busman???s test was performed to ensure adequate perfusion. The patient???s right wrist was prepped and draped in sterile fashion. 1% Lidocaine was used to anesthetize the area. A 20 G Arrow line was introduced into the radial artery. The catheter was threaded over the guide wire and the needle was removed with appropriate pulsatile blood return. The catheter was then sutured in place to the skin and a sterile CHG dressing applied. Perfusion to the extremity distal to the point of catheter insertion was checked and found to be adequate.  A pressure transducer was connected sterilely to the arterial line and an arterial line waveform was noted on the monitor.     Estimated Blood Loss: 1 ml    Condition:  The patient tolerated the procedure well and remains in the same condition as pre-procedure.    Complications:  The patient tolerated the procedure well and there were no complications.    Plan:  - cont hemodynamic monitoring    Keirsten Matuska Fonnie Mu, ACNP

## 2018-04-18 NOTE — Unmapped (Signed)
Vancomycin Therapeutic Monitoring Pharmacy Note    Jessica Wilson is a 77 y.o. female starting vancomycin. Date of therapy initiation: 04/17/18    Indication: sepsis in the setting of functional neutropenia    Prior Dosing Information: Recieved vancomycin IV 2000 mg once (04/17/2018)     Goals:  Therapeutic Drug Levels  Vancomycin trough goal: 15-20 mg/L    Additional Clinical Monitoring/Outcomes  Renal function, volume status (intake and output)    Results: vancomycin random level 19.6 (drawn 13 hours after load)    Wt Readings from Last 1 Encounters:   04/16/18 55.2 kg (121 lb 11.1 oz)     Creatinine   Date Value Ref Range Status   04/18/2018 2.34 (H) 0.60 - 1.00 mg/dL Final   16/06/9603 5.40 (H) 0.60 - 1.00 mg/dL Final   98/07/9146 8.29 (H) 0.60 - 1.00 mg/dL Final        Pharmacokinetic Considerations and Significant Drug Interactions:  ? patient has new AKI with doubling of SCr in last 24 hours  ? Concurrent nephrotoxic meds: not applicable    Assessment/Plan:  Recommended Dose  ? Start  vancomycin IV 500 mg every 24 hours. With AKI, will watch renal function closely and tentatively schedule regimen.     ? Estimated trough on recommended regimen: 17 mg/L    Follow-up  ? Level due: Monday 04/20/18  ? A pharmacist will continue to monitor and order levels as appropriate    Please page service pharmacist with questions/clarifications.    Evon Slack, PharmD

## 2018-04-18 NOTE — Unmapped (Signed)
Patient was compliant with all inhaled scheduled medications and was under no apparent distress.  Patient remains on HFNC 50% FIO2 and 40LPM flow

## 2018-04-18 NOTE — Unmapped (Signed)
MICU Daily Progress Note     Date of Service: 04/18/2018    Problem List:   Principal Problem:    Acute and chronic respiratory failure with hypoxia (CMS-HCC)  Active Problems:    Acute myeloid leukemia not having achieved remission (CMS-HCC)    Paroxysmal atrial fibrillation with RVR (CMS-HCC)    AKI (acute kidney injury) (CMS-HCC)    Malignant pleural effusion    Hyperglycemia  Resolved Problems:    * No resolved hospital problems. *      Interval history: Jessica Wilson is a 77 y.o. 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 She had increasing??R??malignant/paramalignant pleural effusion with significant dyspnea. A pigtail chest tube was placed on 04/05/18 with improvement in dyspnea. IP was consulted for PleurX catheter placement, but because her platelets could not be transfused to >50, opted for pleurodesis with doxycycline instead. She underwent procedure without difficulty. Persistently in a fib this hospitalization with multiple episodes of RVR, 8/8.     24hr events: Continued to have increased work of breathing overnight.  Chest tube drained 200 cc this morning. Continued in A. fib with RVR.  Discussed with husband and sister as well as patient about ongoing goals of care.  Discussed for approximately 30 minutes.  She has continued to decline and will transition to comfort care.      Dispo: ICU    Neurological   Acute pain:   - pain adeq controlled  - previously on morphine PCA  - Morphine 2-4mg  PRN    Pulmonary   Empyema vs Parapneumonic effusion:??Empirically treated with antifungals from 7/27-7/29. CT on 7/22 showed new b/l ground glass opacities and R-sided pleural effusion. BAL cx's no growth at 4 days. Thoracentesis consistent with exudative effusion. Chest tube with bloody drainage, pulm does not feel it is consistent with hemothorax. Has been on 1-2L since chest tube placement. Completed 5 day course of prednisone and levofloxacin for possible COPD exacerbation. Pt and pulmonology in agreement to pursue chemical pleurodesis 8/7, was transfered to stepdown unit for pre and post procedural monitoring. S/p pleurodesis with doxycycline 8/7.   - Continue to wean O2 as tolerated  - Rt Chest tube to water seal, flush twice daily with 20cc sterile saline, minimal serosang drng  - PRN albuterol nebulizers  - Continue Symbicort inhaler BID  - Pt refused cpap this morning requesting [place], transitioned to HFNC tolerating well    Cardiovascular   A-fib with RVR: Has a h/o A-fib and is on diltiazem at home. Has been tachycardic to the 130 this admission. She was switched to metoprolol this admission due to concern to for interaction between diltiazem and future chemotherapy.  - previously on metop, d/c'd after amio added and d/t soft pressures  - start amiodarone, bolus with 150mg  over 60 minutes, then 400mg  TID  - phenylephrine for MAP>65  - echo 03/2018: EF wnl w grade 2 diast dysfxn    Renal   AKI:   - adeq inadeq  - foley for accurate I/O during critical illness (pure wick unsuccessful)  - goal euvolemia   - replete electrolytes prn and monitor daily  - given lasix this am w/o good effect, small fluid bolus (250) given for mild hypotension and low dark uop   - 300 mg allopurinol daily    Infectious Disease/Autoimmune   #SIRS ?pulm source  - afebrile, relative leukocytosis in immunosuppressed pt  - changed levaquin to vanc and cefepime 8/9  for HAP cvg  - f/u pharm recs for van dosing  - pan cx for T>38.2   - Continue acyclovir for prophylaxis      FEN/GI   #no active issues  - poor po intake, last BM on 8/7  - poor intake oral diet  - no gi ppx since taking po  - prn bowel regimen      Malnutrition Assessment: Not done yet.       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 Malen Gauze as an outpatient as well as Dr. Donneta Romberg locally in [place]. Was on Enasidenib since 06/2017 and this was discontinued at admission. FLT3 negative. Will follow-up with Dr. Malen Gauze regarding outpatient chemotherapy. Most likely no curative options, but might help improve her respiratory status.  - 300mg  allopurinol BID  - Daily??BMP??  - Daily??CBC w/ diff  - Transfuse for Hgb <7 and platelets <10 (2 units prbc and 1 plt given 8/9)  -??Transfuse cryoprecipitate for fibrinogen <150 if bleeding or consumptive process??  - Continue acyclovir for prophylaxis    Endocrine   Hyperglycemia:??Has required insulin in the setting of steroids. Will begin to wean insulin after discontinuing prednisone on 8/1. Required 4 units SSI yesterday.   - holding NPH  - cont SSI    Prophylaxis/LDA/Restraints/Consults   Can CVC be removed? No, port for chemo  Can A-line be removed? No: inadequate non-invasive pressure monitoring  Can Foley be removed? N/A, no Foley present  Mobility plan: Step 3 - Bed in chair position     Feeding: Regular diet  Analgesia: Pain adequately controlled  Sedation SAT/SBT: N/A  Thrombembolic ppx: Mechanical only, chemical contraindicated secondary to platelets <50  Head of bed: >30 deg  Ulcer ppx: Not indicated  Glucose within target range: Yes, in range    Patient Lines/Drains/Airways Status    Active Active Lines, Drains, & Airways     Name:   Placement date:   Placement time:   Site:   Days:    Port A Cath 02/05/18 Left Subclavian   02/05/18    0732    Subclavian   72    Chest Drainage System 1 Right Pleural 14 Fr.   04/05/18    1430    Pleural   13    Urethral Catheter Non-latex;Temperature probe 16 Fr.   04/17/18    1400    Non-latex;Temperature probe   1    Peripheral IV 04/16/18 Right Forearm   04/16/18    1000    Forearm   2    Peripheral IV 04/17/18 Left Hand   04/17/18    1100    Hand   1    Arterial Line 04/17/18 Right Radial   04/17/18    1000    Radial   1              Patient Lines/Drains/Airways Status    Active Wounds     None                Goals of Care     Code Status: DNR, not DNI    Designated Healthcare Decision Maker:  [name] currently has decisional capacity for healthcare decision-making and is able to designate a surrogate healthcare decision maker. [name] designated healthcare decision maker(s) is/are Jessica Wilson (the patient's spouse) as denoted by stated patient preference.      Subjective     As above in 24hr events    Objective  Vitals - past 24 hours  Heart Rate:  [122-153] 139  SpO2 Pulse:  [98-147] 141  Resp:  [15-32] 15  FiO2 (%):  [40 %-60 %] 40 %  SpO2:  [94 %-99 %] 98 % Intake/Output  I/O last 3 completed shifts:  In: 2008.8 [P.O.:680; I.V.:360.8; Blood:308; IV Piggyback:660]  Out: 460 [Urine:350; Chest Tube:110]     Physical Exam:    General: slight inc WOB  ENT: MMM  CV: tachy irregularly irregular rhythm. No m/r/c/g  Pulm: scattered rhonchi L>R, dec bases,   GI: Soft, non tender, obese abd  MSK: No BLE edema    Neuro exam:   Mental status: GCS e4, v4, m6, intmt confusion, talking to people not in the room  Speech/Language: names/repeats, dyspnea  Cranial nerves: Lt pupil 3Br, Rt pupil NR and fixed (congenital), Lt EOMi, VFF, gaze mdl,  V1-3 intact, face symm, uvula mdl/upgoing, tong mdl, shoulders full/equal   Motor: mod strength x 4drift, bulk and tone   Sensory: intact       Continuous Infusions:   ??? morphine (PF) in 0.9 % NaCl     ??? sodium chloride 20 mL/hr (03/30/18 1600)       Scheduled Medications:   ??? albuterol       ??? levalbuterol  1.25 mg Nebulization Q4H (RT)   ??? MORPhine injection  4 mg Intravenous Once       PRN medications:  acetaminophen, levalbuterol, LORazepam, MORPhine    Data/Imaging Review: Reviewed in Epic and personally interpreted on 04/18/2018. See EMR for detailed results.      Critical Care Attestation     This patient is critically ill or injured with the impairment of vital organ systems such that there is a high probability of imminent or life threatening deterioration in the patient's condition. This patient must remain in the ICU for ongoing evaluation of the comprehensive management plan outlined in this note. I directly provided critical care services as documented in this note and the critical care time spent (80 min) is exclusive of separately billable procedures.    Lavella Hammock, MD

## 2018-04-19 MED ORDER — GENERIC EXTERNAL MEDICATION
.00 | Status: DC
Start: ? — End: 2018-04-19

## 2018-04-19 MED ORDER — MORPHINE SULFATE 4 MG/ML IJ SOLN
2.00 | INTRAMUSCULAR | Status: DC
Start: ? — End: 2018-04-19

## 2018-04-19 MED ORDER — LEVALBUTEROL HCL 1.25 MG/3ML IN NEBU
1.25 | INHALATION_SOLUTION | RESPIRATORY_TRACT | Status: DC
Start: ? — End: 2018-04-19

## 2018-04-19 MED ORDER — LORAZEPAM 2 MG/ML IJ SOLN
1.00 | INTRAMUSCULAR | Status: DC
Start: ? — End: 2018-04-19

## 2018-04-19 NOTE — Unmapped (Signed)
Pt with increased WOB and AMS this morning. Ultimately pt and family decided to pursue comfort measures. Morphine gtt initiated and titrated for patient comfort. Will continue to monitor closely.  Problem: Adult Inpatient Plan of Care  Goal: Plan of Care Review  Outcome: Progressing  Goal: Optimal Comfort and Wellbeing  Outcome: Progressing  Goal: Rounds/Family Conference  Outcome: Progressing     Problem: Pain Chronic (Persistent) (Comorbidity Management)  Goal: Acceptable Pain Control and Functional Ability  Outcome: Progressing     Problem: Skin Injury Risk Increased  Goal: Skin Health and Integrity  Outcome: Progressing

## 2018-04-19 NOTE — Unmapped (Signed)
Physician Discharge Summary [facility]  MICU [facility]  [address]  [place] [address]  Dept: [phone]  Loc: [phone]     Identifying Information:   Jessica Wilson  May 12, 1941  [id number]    Primary Care Physician: Kerman Passey, MD   Code Status: DNR and DNI    Admit Date: 03/30/2018    Discharge Date: 2018/05/18     Discharge To: Deceased -  Jessica Wilson had a pronouncement of death May 18, 2018 and the time of death is 4:27 . The pronouncement of death was made by Lindwood Qua.   The events prior to death were respiratory failure.  The parties present at time of death was/were family. The patient's HCPOA was notified of the death. This case was not referred to the medical examiner. An autopsy was declined.    Discharge Service: MDI - Pulmonary - MICU Admit     Discharge Attending Physician: Princella Ion, MD    Discharge Diagnoses:  Principal Problem:    Acute and chronic respiratory failure with hypoxia (CMS-HCC)  Active Problems:    Acute myeloid leukemia not having achieved remission (CMS-HCC)    Paroxysmal atrial fibrillation with RVR (CMS-HCC)    AKI (acute kidney injury) (CMS-HCC)    Malignant pleural effusion    Hyperglycemia  Resolved Problems:    * No resolved hospital problems. *      Outpatient Provider Follow Up Issues:   - no outpatient provider issues    Hospital Course:   Jessica Wilson is a 77 y.o. female with PMHx of AML who presented to [facility] with  acute on chronic respiratory failure with hypoxia concerning for differentiation syndrome vs leukocyte infiltration vs pneumonia.     Hypoxic respiratory failure:??Most likely 2/2 pulmonary leukocyte infiltration. She was treated for pneumonia without improvement of her oxygenation. She was made DNR/DNI. She was made comfort care.     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 Malen Gauze as an outpatient as well as Dr. Donneta Romberg locally in [place]. Was on Enasidenib since 06/2017 and this was discontinued at admission. Her leukemia mutation panel came back and she was FLT3 negative. Given her frail condition and likely spread of her leukemia to involve the lungs, no new chemotherapy was started in the hospital.       Procedures:  No admission procedures for hospital encounter.  ______________________________________________________________________  Discharge Medications:     Your Medication List      ASK your doctor about these medications    acetaminophen 325 MG tablet  Commonly known as:  TYLENOL  Take 650 mg by mouth every six (6) hours as needed for pain.     acyclovir 400 MG tablet  Commonly known as:  ZOVIRAX  Take 400 mg by mouth Two (2) times a day.     ANORO ELLIPTA 62.5-25 mcg/actuation inhaler  Generic drug:  umeclidinium-vilanterol  Inhale 1 puff daily.     buPROPion 150 MG 12 hr tablet  Commonly known as:  WELLBUTRIN SR  Take 150 mg by mouth Two (2) times a day.     chlorhexidine 0.12 % solution  Commonly known as:  PERIDEX  15 mL by Mouth route Two (2) times a day.     clonazePAM 0.5 MG tablet  Commonly known as:  KlonoPIN  Take 0.5 mg by mouth two (2) times a day as needed for anxiety.     diltiazem 180 MG 24 hr capsule  Commonly known as:  CARDIZEM CD  Take 180 mg by mouth daily.     hydroxyurea 500 mg capsule  Commonly known as:  HYDREA  Take 2 capsules (1,000 mg total) by mouth Two (2) times a day.     IDHIFA 100 mg tablet  Generic drug:  enasidenib  TAKE 1 TABLET BY MOUTH ONCE DAILY     sertraline 100 MG tablet  Commonly known as:  ZOLOFT  Take 100 mg by mouth daily.     VENCLEXTA 100 mg tablet  Generic drug:  venetoclax  TAKE 4 TABLETS ONCE DAILY WITH A MEAL AND WATER *DO NOT START UNTIL INSTRUCTED BY ONCOLOGIST*            Allergies:  Macrobid [nitrofurantoin monohyd/m-cryst]  ______________________________________________________________________  Pending Test Results (if blank, then none):   Order Current Status    AFB culture Preliminary result    Blood Culture #1 Preliminary result    Blood Culture #2 Preliminary result    Fungal Culture Preliminary result          Most Recent Labs:  All lab results last 24 hours -   Recent Results (from the past 24 hour(s))   Basic Metabolic Panel    Collection Time: 04/18/18  8:27 AM   Result Value Ref Range    Sodium 136 135 - 145 mmol/L    Potassium 4.8 3.5 - 5.0 mmol/L    Chloride 107 98 - 107 mmol/L    CO2 19.0 (L) 22.0 - 30.0 mmol/L    Anion Gap 10 9 - 15 mmol/L    BUN 46 (H) 7 - 21 mg/dL    Creatinine 4.78 (H) 0.60 - 1.00 mg/dL    BUN/Creatinine Ratio 20     EGFR CKD-EPI Non-African American, Female 20 (L) >=60 mL/min/1.22m2    EGFR CKD-EPI African American, Female 23 (L) >=60 mL/min/1.60m2    Glucose 155 65 - 179 mg/dL    Calcium 8.1 (L) 8.5 - 10.2 mg/dL   POCT Glucose    Collection Time: 04/18/18 11:55 AM   Result Value Ref Range    Glucose, POC 198 (H) 65 - 179 mg/dL   Prepare RBC    Collection Time: 04/18/18  7:03 PM   Result Value Ref Range    Crossmatch Compatible     Unit Blood Type A Pos     ISBT Number 6200     Unit # [id number]     Status Transfused     Product ID Red Blood Cells     PRODUCT CODE [id number]     Crossmatch Compatible     Unit Blood Type A Pos     ISBT Number 6200     Unit # [id number]     Status Transfused     Product ID Red Blood Cells     PRODUCT CODE [id number]        Relevant Studies/Radiology (if blank, then none):  Xr Chest Portable    Result Date: 04/17/2018  EXAM: XR CHEST PORTABLE DATE: 04/17/2018 2:11 PM ACCESSION: [id number] UN DICTATED: 04/17/2018 2:12 PM [facility] CLINICAL INDICATION: 78 years old Female with POSTSURGICAL STATUS -  chest tube placement  COMPARISON: 04/16/2018 TECHNIQUE: Portable Chest Radiograph. FINDINGS: Left lower chest pigtail catheter mildly retracted. Stable left chest port catheter. Increased right and similar left basilar opacities. Moderate right and trace left pleural effusions. Stable cardiomediastinal silhouette.     Increased now moderate right and stable trace left pleural effusions. Mildly retracted left lower chest pigtail catheter.  Xr Chest Portable    Result Date: 04/16/2018  EXAM: XR CHEST PORTABLE DATE: 04/16/2018 6:06 PM ACCESSION: [id number] UN DICTATED: 04/16/2018 6:07 PM INTERPRETATION LOCATION: [facility] CLINICAL INDICATION: 77 years old Female with HYPOXEMIA  COMPARISON: 04/16/2018 12:53 PM TECHNIQUE: Portable Chest Radiograph. FINDINGS: The previously noted inferiorly placed right-sided pleural drainage catheter is no longer kinked. Unchanged left Port-A-Cath tip at the cavoatrial junction. Bibasilar heterogeneous opacities likely represent edema and atelectasis. Trace left and small right pleural effusions. No pneumothorax. Stable cardiomediastinal silhouette.     - Bilateral pleural effusions and pulmonary edema, similar to prior. - Improved position of right pleural drainage catheter. No pneumothorax.    Xr Chest Portable    Result Date: 04/16/2018  EXAM: XR CHEST PORTABLE DATE: 04/16/2018 12:58 PM ACCESSION: [id number] UN DICTATED: 04/16/2018 1:14 PM [facility]PRETATION LOCATION: [facility] CLINICAL INDICATION: 77 years old Female with SHORTNESS OF BREATH  COMPARISON: Same day chest radiograph 8:46 AM TECHNIQUE: Portable Chest Radiograph. CONCLUSIONS: There is kinking of inferiorly placed right sided pleural drainage catheter, cannot recommend repositioning. Stable appearing heterogeneous airspace opacities and bilateral pleural effusions. No other significant interval changes in the same day chest radiograph.    Xr Chest Portable    Result Date: 04/16/2018  EXAM: XR CHEST PORTABLE DATE: 04/16/2018 8:50 AM ACCESSION: [id number] UN DICTATED: 04/16/2018 8:56 AM [facility]PRETATION [facility]ATION: [facility] CLINICAL INDICATION: 77 years old Female with DYSPNEA  COMPARISON: 04/13/2017 chest x-ray. TECHNIQUE: Portable Chest Radiograph. FINDINGS: Right-sided chest tube seen between the fifth and fourth intercostal space. Lower right chest tube no longer imaged. Unchanged left Port-A-Cath. Stable appearing bibasilar opacities small right and trace left pleural effusions. Unchanged heterogeneous airspace opacities bilaterally. No pneumothorax. Stable cardiomediastinal silhouette.     Stable bilateral pleural effusions and basilar opacities. Unchanged heterogeneous airspace opacities bilaterally. Chest tube now seen between the fourth and fifth intercostal spaces on the right.     Xr Chest Portable    Result Date: 04/13/2018  EXAM: XR CHEST PORTABLE DATE: 04/13/2018 8:21 AM ACCESSION: [id number] UN DICTATED: 04/13/2018 9:47 AM [facility]PRETATION [facility]ATION: [facility] CLINICAL INDICATION: 77 years old Female with PLEURAL EFFUSION  COMPARISON: Chest radiograph dated 04/12/2018 TECHNIQUE: Portable Chest Radiograph. FINDINGS: Stable support devices. Bibasilar opacities with small right and trace left pleural effusions. No pneumothorax. Stable cardiomediastinal silhouette.     Stable bilateral pleural effusions and bibasilar opacities.    Xr Chest Portable    Result Date: 04/12/2018  EXAM: XR CHEST PORTABLE DATE: 04/12/2018 9:44 AM ACCESSION: [id number] UN DICTATED: 04/12/2018 10:56 AM INTERPRETATION LOCATION: [facility] CLINICAL INDICATION: 77 years old Female - PLEURAL EFFUSION  COMPARISON: Chest radiograph dated 04/11/2018. TECHNIQUE: Portable AP chest radiograph obtained at 0910 hours. FINDINGS: Support devices, cardiomediastinal silhouette, and lungs (right pleural effusion with bibasilar atelectasis) are unchanged.     No interval change.    Xr Chest Portable    Result Date: 04/11/2018  EXAM: XR CHEST PORTABLE DATE: 04/11/2018 9:20 AM ACCESSION: [id number] UN DICTATED: 04/11/2018 9:46 AM INTERPRETATION [facility] CLINICAL INDICATION: 77 years old Female - PLEURAL EFFUSION  COMPARISON: Chest radiograph dated 04/10/2018. TECHNIQUE: Portable AP chest radiograph obtained at 0907 hours. FINDINGS: Right chest tube and left internal jugular Port-A-Cath, cardiomediastinal silhouette, and lungs (small right pleural effusion and basilar atelectasis) are unchanged.     No interval change.    Xr Chest Portable    Result Date: 04/10/2018  EXAM: XR CHEST PORTABLE DATE: 04/10/2018 8:00 AM ACCESSION: [id number] UN DICTATED: 04/10/2018 10:25 AM INTERPRETATION LOCATION: [facility] CLINICAL INDICATION: 77 years old Female with  PLEURAL EFFUSION  COMPARISON: 04/09/2018 chest x-ray. TECHNIQUE: Portable Chest Radiograph. FINDINGS: Unchanged lines and tubes. Mild bilateral patchy opacities unchanged. Bilateral trace pleural effusions unchanged. No pneumothorax Stable cardiomediastinal silhouette.     No pneumothorax. Unchanged bilateral pleural effusions and pulmonary edema.    Xr Chest Portable    Result Date: 04/09/2018  EXAM: XR CHEST PORTABLE DATE: 04/09/2018 8:42 AM ACCESSION: [id number] UN DICTATED: 04/09/2018 9:12 AM INTERPRETATION [facility]ATION: [facility] CLINICAL INDICATION: 77 years old Female with Chest tube  COMPARISON: None TECHNIQUE: Portable Chest Radiograph. FINDINGS: Unchanged lines and tubes. Unchanged pulmonary edema. Pleural effusions appear unchanged. No pneumothorax Stable cardiomediastinal silhouette.     Essentially unchanged pulmonary edema and pleural effusions.    Xr Chest Portable    Result Date: 04/08/2018  EXAM: XR CHEST PORTABLE DATE: 04/08/2018 6:52 AM ACCESSION: [id number] UN DICTATED: 04/08/2018 8:39 AM INTERPRETATION [facility] CLINICAL INDICATION: 77 years old Female with PLEURAL EFFUSION  COMPARISON: 04/07/2018 TECHNIQUE: Portable Chest Radiograph. CONCLUSIONS: Left chest wall port terminates at the level of distal SVC. Right pleural drain is unchanged. Stable cardiomediastinal silhouette. Pulmonary edema is similar to previous study. Bilateral small pleural effusions, similar to previous study.    Xr Chest Portable    Result Date: 04/07/2018  EXAM: XR CHEST PORTABLE DATE: 04/07/2018 7:14 AM ACCESSION: [id number] UN DICTATED: 04/07/2018 9:07 AM INTERPRETATION LOCATION: [facility] CLINICAL INDICATION: 77 years old Female with PLEURAL EFFUSION  COMPARISON: Chest radiograph dated 04/05/2018 TECHNIQUE: Portable Chest Radiograph. FINDINGS: Stable left chest port. Mild interval repositioning of the right inferior chest tube. Stable bilateral interstitial opacities, right greater than left. Small right pleural effusion, stable. Stable cardiomediastinal silhouette.     - Stable right pleural effusion and bilateral interstitial opacities. - Mild pulmonary edema.    Xr Chest Portable    Result Date: 04/05/2018  EXAM: XR CHEST PORTABLE DATE: 04/05/2018 4:26 PM ACCESSION: [id number] UN DICTATED: 04/05/2018 4:36 PM INTERPRETATION LOCATION: [facility] CLINICAL INDICATION: 76 years old Female with POSTSURGICAL STATUS  COMPARISON: Chest radiograph dated 04/04/2018, and earlier studies. TECHNIQUE: Portable Chest Radiograph at 1619 hours FINDINGS: Interval placement of right-sided pleural drain, with tip projecting over the medial aspect of the right lung base. Left-sided chest port, with tip projecting over the mid SVC. Redemonstration of interstitial and patchy parenchymal opacities bilaterally, with slightly decreased opacification in the right lower hemithorax. Small-to-moderate right pleural effusion persists. No pneumothorax. The cardiomediastinal silhouette is unchanged. The visualized upper abdomen and osseous structures are unremarkable.     Interval placement of right-sided pleural drain, with tip projecting over the medial aspect of the right hemithorax. Left-sided chest port is unchanged in positioning. Decreased opacification of the right lower hemithorax, likely reflecting decreased size of right pleural effusion.    Xr Chest Portable    Result Date: 04/04/2018  EXAM: XR CHEST PORTABLE DATE: 04/04/2018 4:19 PM ACCESSION: [id number] UN DICTATED: 04/04/2018 5:00 PM INTERPRETATION [facility]: [facility] CLINICAL INDICATION: 77 years old Female with ALTERED MENTAL STATUS  COMPARISON: 03/30/2018 TECHNIQUE: Portable Chest Radiograph at 1609 hours FINDINGS: Left chest wall Port-A-Cath tip terminates in the mid SVC. Similar appearance of interstitial/alveolar infiltrates bilaterally with increased opacification of the right lower hemithorax. Moderate right pleural effusion. No pneumothorax. Partially obscured cardiomediastinal silhouette.     Increased opacification of the right lower hemithorax may be secondary to increasing size of pleural effusion versus consolidation.    Xr Chest 1 View    Result Date: 04/18/2018  EXAM: XR CHEST 1 VIEW DATE: 04/18/2018 10:05 AM ACCESSION: [id number] UN DICTATED: 04/18/2018 11:08  AM INTERPRETATION [facility]: [facility] CLINICAL INDICATION: 77 years old Female with chest tube  COMPARISON: 04/17/2018 and earlier TECHNIQUE: Portable Chest Radiograph. FINDINGS: Unchanged lines and tubes. Diffuse bilateral airspace opacities, unchanged. Pleural effusions are unchanged. No pneumothorax. Stable partially obscured cardiomediastinal silhouette.     Unchanged pulmonary edema with pleural effusions. Likely component of atelectasis. Superimposed infection is not excluded.    Ct Chest Wo Contrast    Result Date: 03/31/2018  EXAM: CT CHEST WO CONTRAST DATE: 03/30/2018 8:35 PM ACCESSION: [id number] UN DICTATED: 03/30/2018 9:11 PM INTERPRETATION [facility]: [facility] CLINICAL INDICATION: 77 years old Female with leukemic infiltrive vs differentiation syndrome vs PNA  COMPARISON: Chest radio graph dated 03/20/2018 and chest CT dated 03/11/2018 TECHNIQUE: A spiral CT scan was obtained without IV contrast from the thoracic inlet through the hemidiaphragms. Images were reconstructed in the axial plane.  Coronal and sagittal reformatted images of the chest were also provided for further evaluation of the lung parenchyma. FINDINGS: LINES/DEVICES: Left chest port with tip at the superior cavoatrial junction. CHEST: Diffuse bilateral nodular opacities from 03/11/2018 are essentially resolved with only residual nodular opacities persisting.. New heterogeneous ground-glass opacities involving all lung lobes both centrally and peripherally. Small, increased right pleural effusion. New right paracardiac 4.5 x 1.6 cm opacity, likely mediastinal pleural thickening (image 52, series 2). Several mildly enlarged, but unchanged mediastinal nodes. Heart size normal. No pericardial effusion. Normal caliber thoracic aorta. Image abdomen and osseous structures are unremarkable. Partially imaged anterior upper abdominal subcutaneous soft tissue lesion as on 12/15/2017 CT abdomen.     New ground-glass opacities diffusely; differential considerations include diffuse alveolar damage and differentiation syndrome (secondary to enasidenib). Small right pleural effusion and new right-sided mediastinal pleural thickening; differential diagnosis includes leukemic infiltration. Numerous bilateral lung nodules from 03/11/2018 CT chest are essentially resolved.    ______________________________________________________________________  Discharge Instructions:           Other Instructions     Send for Transfusion(ONC use only)      Product:  Platelets    Tube Station:  [id number]          Follow Up instructions and Outpatient Referrals     Platelet count      Repeat platelet counts 30-60 minutes after completion of transfusion.               Appointments which have been scheduled for you    Apr 22, 2018  7:45 AM EDT  (Arrive by 7:15 AM)  LAB ONLY [place] with ADULT ONC LAB  [facility] DRAW STATION [facility] REGION) [address]  [place] [address]  [phone]      Apr 22, 2018  8:30 AM EDT  (Arrive by 8:00 AM)  RETURN ACTIVE [place] with Guerry Bruin, MD  [facility] HEMATOLOGY ONCOLOGY 2ND FLR CANCER HOSP [facility] REGION) [address]  [place] [address]  [phone]           ______________________________________________________________________  Discharge Day Services:  BP 82/57  - Pulse 93  - Temp 36.4 ??C (Oral)  - Resp 10  - Ht 160 cm (5' 2.99)  - Wt 55.2 kg (121 lb 11.1 oz)  - SpO2 (!) 77%  - BMI 21.56 kg/m??   Deceased    Condition at Discharge: Deceased    Length of Discharge: I spent greater than 30 mins in the discharge of this patient.

## 2018-04-20 ENCOUNTER — Ambulatory Visit: Admit: 2018-04-20 | Payer: Medicare Other | Admitting: Ophthalmology

## 2018-04-20 SURGERY — PHACOEMULSIFICATION, CATARACT, WITH IOL INSERTION
Anesthesia: Topical | Laterality: Left

## 2018-04-20 NOTE — Unmapped (Signed)
Hi,     Rayann Heman contacted the Communication Center regarding the following:    - [name] Ting's sister wished to contact her providers to express gratitude for their care.    Please contact Ms. Smith at [phone]. Ms. Katrinka Blazing will be at the funeral home at 1:00 and is unsure when she will be available.    Thanks in advance,    Kelli Hope  [facility]   [phone]

## 2018-05-10 DEATH — deceased

## 2018-05-13 NOTE — Telephone Encounter (Signed)
x
